# Patient Record
Sex: Female | Born: 1945
Health system: Southern US, Community
[De-identification: ages and names within clinical notes are randomized; demographics above are authoritative.]

## PROBLEM LIST (undated history)

## (undated) DIAGNOSIS — E7800 Pure hypercholesterolemia, unspecified: Secondary | ICD-10-CM

## (undated) DIAGNOSIS — E785 Hyperlipidemia, unspecified: Secondary | ICD-10-CM

## (undated) DIAGNOSIS — F329 Major depressive disorder, single episode, unspecified: Secondary | ICD-10-CM

## (undated) DIAGNOSIS — R7303 Prediabetes: Secondary | ICD-10-CM

## (undated) DIAGNOSIS — C189 Malignant neoplasm of colon, unspecified: Secondary | ICD-10-CM

## (undated) DIAGNOSIS — F418 Other specified anxiety disorders: Secondary | ICD-10-CM

## (undated) DIAGNOSIS — F419 Anxiety disorder, unspecified: Secondary | ICD-10-CM

## (undated) DIAGNOSIS — F32A Depression, unspecified: Secondary | ICD-10-CM

## (undated) DIAGNOSIS — Z85038 Personal history of other malignant neoplasm of large intestine: Secondary | ICD-10-CM

## (undated) DIAGNOSIS — M1990 Unspecified osteoarthritis, unspecified site: Secondary | ICD-10-CM

## (undated) DIAGNOSIS — R918 Other nonspecific abnormal finding of lung field: Secondary | ICD-10-CM

## (undated) DIAGNOSIS — R3915 Urgency of urination: Secondary | ICD-10-CM

## (undated) HISTORY — PX: COLON SURGERY: SHX602

## (undated) HISTORY — PX: APPENDECTOMY: SHX54

## (undated) HISTORY — DX: Other nonspecific abnormal finding of lung field: R91.8

## (undated) HISTORY — DX: Personal history of other malignant neoplasm of large intestine: Z85.038

## (undated) HISTORY — DX: Other specified anxiety disorders: F41.8

## (undated) HISTORY — PX: AUGMENTATION MAMMAPLASTY: SUR837

## (undated) HISTORY — PX: EYE SURGERY: SHX253

## (undated) HISTORY — DX: Hyperlipidemia, unspecified: E78.5

---

## 1976-06-05 HISTORY — PX: BREAST SURGERY: SHX581

## 1998-02-02 ENCOUNTER — Ambulatory Visit (HOSPITAL_COMMUNITY): Admission: RE | Admit: 1998-02-02 | Discharge: 1998-02-02 | Payer: Self-pay | Admitting: *Deleted

## 2004-12-01 ENCOUNTER — Ambulatory Visit (HOSPITAL_COMMUNITY): Admission: RE | Admit: 2004-12-01 | Discharge: 2004-12-01 | Payer: Self-pay | Admitting: Internal Medicine

## 2004-12-13 ENCOUNTER — Encounter: Admission: RE | Admit: 2004-12-13 | Discharge: 2004-12-13 | Payer: Self-pay | Admitting: Internal Medicine

## 2008-06-05 HISTORY — PX: PARTIAL COLECTOMY: SHX5273

## 2009-02-09 ENCOUNTER — Encounter: Payer: Self-pay | Admitting: Gastroenterology

## 2009-02-09 ENCOUNTER — Inpatient Hospital Stay (HOSPITAL_COMMUNITY): Admission: EM | Admit: 2009-02-09 | Discharge: 2009-02-22 | Payer: Self-pay | Admitting: Emergency Medicine

## 2009-02-09 ENCOUNTER — Ambulatory Visit: Payer: Self-pay | Admitting: Gastroenterology

## 2009-02-10 ENCOUNTER — Ambulatory Visit: Payer: Self-pay | Admitting: Oncology

## 2009-02-10 ENCOUNTER — Encounter: Payer: Self-pay | Admitting: Gastroenterology

## 2009-02-12 ENCOUNTER — Encounter: Payer: Self-pay | Admitting: Internal Medicine

## 2009-02-15 ENCOUNTER — Encounter (INDEPENDENT_AMBULATORY_CARE_PROVIDER_SITE_OTHER): Payer: Self-pay | Admitting: General Surgery

## 2009-02-15 ENCOUNTER — Ambulatory Visit: Payer: Self-pay | Admitting: Oncology

## 2009-03-09 ENCOUNTER — Encounter: Payer: Self-pay | Admitting: Gastroenterology

## 2009-03-09 LAB — CBC WITH DIFFERENTIAL/PLATELET
Eosinophils Absolute: 0.3 10*3/uL (ref 0.0–0.5)
MONO#: 0.4 10*3/uL (ref 0.1–0.9)
NEUT#: 7.4 10*3/uL — ABNORMAL HIGH (ref 1.5–6.5)
Platelets: 311 10*3/uL (ref 145–400)
RBC: 3.67 10*6/uL — ABNORMAL LOW (ref 3.70–5.45)
RDW: 17.8 % — ABNORMAL HIGH (ref 11.2–14.5)
WBC: 10.3 10*3/uL (ref 3.9–10.3)

## 2009-03-09 LAB — COMPREHENSIVE METABOLIC PANEL
AST: 25 U/L (ref 0–37)
Albumin: 3.8 g/dL (ref 3.5–5.2)
CO2: 26 mEq/L (ref 19–32)
Chloride: 103 mEq/L (ref 96–112)
Creatinine, Ser: 0.74 mg/dL (ref 0.40–1.20)
Glucose, Bld: 109 mg/dL — ABNORMAL HIGH (ref 70–99)
Potassium: 3.5 mEq/L (ref 3.5–5.3)
Sodium: 136 mEq/L (ref 135–145)
Total Protein: 6.7 g/dL (ref 6.0–8.3)

## 2009-03-09 LAB — LACTATE DEHYDROGENASE: LDH: 136 U/L (ref 94–250)

## 2009-03-31 ENCOUNTER — Ambulatory Visit: Payer: Self-pay | Admitting: Oncology

## 2009-04-20 ENCOUNTER — Ambulatory Visit: Payer: Self-pay | Admitting: Family Medicine

## 2009-04-26 ENCOUNTER — Ambulatory Visit: Payer: Self-pay | Admitting: Oncology

## 2009-04-26 LAB — COMPREHENSIVE METABOLIC PANEL
Albumin: 4.4 g/dL (ref 3.5–5.2)
Alkaline Phosphatase: 76 U/L (ref 39–117)
BUN: 17 mg/dL (ref 6–23)
Calcium: 9.4 mg/dL (ref 8.4–10.5)
Glucose, Bld: 124 mg/dL — ABNORMAL HIGH (ref 70–99)
Potassium: 4.2 mEq/L (ref 3.5–5.3)

## 2009-04-26 LAB — CBC WITH DIFFERENTIAL/PLATELET
EOS%: 7.8 % — ABNORMAL HIGH (ref 0.0–7.0)
Eosinophils Absolute: 0.4 10*3/uL (ref 0.0–0.5)
MCH: 31.4 pg (ref 25.1–34.0)
MCV: 92.6 fL (ref 79.5–101.0)
MONO%: 8.4 % (ref 0.0–14.0)
NEUT#: 2.3 10*3/uL (ref 1.5–6.5)
RBC: 3.7 10*6/uL (ref 3.70–5.45)
RDW: 21.1 % — ABNORMAL HIGH (ref 11.2–14.5)

## 2009-04-28 ENCOUNTER — Ambulatory Visit (HOSPITAL_COMMUNITY): Admission: RE | Admit: 2009-04-28 | Discharge: 2009-04-28 | Payer: Self-pay | Admitting: Internal Medicine

## 2009-05-21 ENCOUNTER — Ambulatory Visit: Payer: Self-pay | Admitting: Oncology

## 2009-05-25 LAB — CBC WITH DIFFERENTIAL/PLATELET
Basophils Absolute: 0 10*3/uL (ref 0.0–0.1)
EOS%: 7.9 % — ABNORMAL HIGH (ref 0.0–7.0)
Eosinophils Absolute: 0.6 10*3/uL — ABNORMAL HIGH (ref 0.0–0.5)
HGB: 13 g/dL (ref 11.6–15.9)
MCH: 33 pg (ref 25.1–34.0)
MONO#: 0.5 10*3/uL (ref 0.1–0.9)
NEUT#: 3.4 10*3/uL (ref 1.5–6.5)
RDW: 21.8 % — ABNORMAL HIGH (ref 11.2–14.5)
WBC: 7.1 10*3/uL (ref 3.9–10.3)
lymph#: 2.6 10*3/uL (ref 0.9–3.3)

## 2009-05-25 LAB — COMPREHENSIVE METABOLIC PANEL
AST: 60 U/L — ABNORMAL HIGH (ref 0–37)
Albumin: 4.3 g/dL (ref 3.5–5.2)
BUN: 19 mg/dL (ref 6–23)
CO2: 25 mEq/L (ref 19–32)
Calcium: 9.7 mg/dL (ref 8.4–10.5)
Chloride: 105 mEq/L (ref 96–112)
Potassium: 4.5 mEq/L (ref 3.5–5.3)

## 2009-06-22 ENCOUNTER — Ambulatory Visit: Payer: Self-pay | Admitting: Family Medicine

## 2009-06-24 ENCOUNTER — Ambulatory Visit: Payer: Self-pay | Admitting: Oncology

## 2009-06-28 LAB — COMPREHENSIVE METABOLIC PANEL
ALT: 66 U/L — ABNORMAL HIGH (ref 0–35)
Albumin: 4.5 g/dL (ref 3.5–5.2)
CO2: 23 mEq/L (ref 19–32)
Calcium: 9.4 mg/dL (ref 8.4–10.5)
Sodium: 140 mEq/L (ref 135–145)
Total Protein: 7 g/dL (ref 6.0–8.3)

## 2009-06-28 LAB — CBC WITH DIFFERENTIAL/PLATELET
HGB: 12.8 g/dL (ref 11.6–15.9)
MCH: 34.7 pg — ABNORMAL HIGH (ref 25.1–34.0)
MCHC: 34.6 g/dL (ref 31.5–36.0)
MONO%: 6.9 % (ref 0.0–14.0)
NEUT#: 4 10*3/uL (ref 1.5–6.5)
Platelets: 260 10*3/uL (ref 145–400)
RBC: 3.71 10*6/uL (ref 3.70–5.45)

## 2009-06-28 LAB — LACTATE DEHYDROGENASE: LDH: 185 U/L (ref 94–250)

## 2009-07-12 LAB — CBC WITH DIFFERENTIAL/PLATELET
EOS%: 7.5 % — ABNORMAL HIGH (ref 0.0–7.0)
HCT: 37.8 % (ref 34.8–46.6)
LYMPH%: 37.3 % (ref 14.0–49.7)
MCH: 35 pg — ABNORMAL HIGH (ref 25.1–34.0)
Platelets: 242 10*3/uL (ref 145–400)
RDW: 19.5 % — ABNORMAL HIGH (ref 11.2–14.5)
WBC: 7.1 10*3/uL (ref 3.9–10.3)

## 2009-07-26 ENCOUNTER — Ambulatory Visit: Payer: Self-pay | Admitting: Oncology

## 2009-07-26 LAB — CBC WITH DIFFERENTIAL/PLATELET
BASO%: 0.7 % (ref 0.0–2.0)
Eosinophils Absolute: 0.5 10*3/uL (ref 0.0–0.5)
HCT: 33.7 % — ABNORMAL LOW (ref 34.8–46.6)
MCHC: 35.1 g/dL (ref 31.5–36.0)
MONO#: 0.4 10*3/uL (ref 0.1–0.9)
NEUT#: 2.8 10*3/uL (ref 1.5–6.5)
NEUT%: 45.2 % (ref 38.4–76.8)
WBC: 6.3 10*3/uL (ref 3.9–10.3)
lymph#: 2.4 10*3/uL (ref 0.9–3.3)

## 2009-08-09 LAB — CBC WITH DIFFERENTIAL/PLATELET
BASO%: 0.9 % (ref 0.0–2.0)
Eosinophils Absolute: 0.4 10*3/uL (ref 0.0–0.5)
HGB: 11.9 g/dL (ref 11.6–15.9)
LYMPH%: 38.8 % (ref 14.0–49.7)
MCH: 36.3 pg — ABNORMAL HIGH (ref 25.1–34.0)
MCHC: 34.4 g/dL (ref 31.5–36.0)
MCV: 105.7 fL — ABNORMAL HIGH (ref 79.5–101.0)
MONO#: 0.4 10*3/uL (ref 0.1–0.9)
NEUT%: 45.6 % (ref 38.4–76.8)
Platelets: 226 10*3/uL (ref 145–400)
WBC: 5.7 10*3/uL (ref 3.9–10.3)
lymph#: 2.2 10*3/uL (ref 0.9–3.3)

## 2009-08-09 LAB — IRON AND TIBC
%SAT: 22 % (ref 20–55)
UIBC: 307 ug/dL

## 2009-08-09 LAB — COMPREHENSIVE METABOLIC PANEL
AST: 22 U/L (ref 0–37)
Albumin: 4.1 g/dL (ref 3.5–5.2)
Alkaline Phosphatase: 102 U/L (ref 39–117)
BUN: 15 mg/dL (ref 6–23)
Total Bilirubin: 0.4 mg/dL (ref 0.3–1.2)

## 2009-08-09 LAB — FERRITIN: Ferritin: 117 ng/mL (ref 10–291)

## 2009-08-23 ENCOUNTER — Ambulatory Visit: Payer: Self-pay | Admitting: Oncology

## 2009-08-23 LAB — CBC WITH DIFFERENTIAL/PLATELET
BASO%: 0.7 % (ref 0.0–2.0)
Basophils Absolute: 0.1 10*3/uL (ref 0.0–0.1)
Eosinophils Absolute: 0.5 10*3/uL (ref 0.0–0.5)
HGB: 13.2 g/dL (ref 11.6–15.9)
LYMPH%: 24.6 % (ref 14.0–49.7)
MONO%: 5.6 % (ref 0.0–14.0)
NEUT%: 63.5 % (ref 38.4–76.8)
RDW: 17.7 % — ABNORMAL HIGH (ref 11.2–14.5)
WBC: 9.4 10*3/uL (ref 3.9–10.3)
lymph#: 2.3 10*3/uL (ref 0.9–3.3)

## 2009-09-02 ENCOUNTER — Ambulatory Visit: Payer: Self-pay | Admitting: Family Medicine

## 2009-09-06 LAB — CBC WITH DIFFERENTIAL/PLATELET
Basophils Absolute: 0 10*3/uL (ref 0.0–0.1)
EOS%: 5.6 % (ref 0.0–7.0)
Eosinophils Absolute: 0.4 10*3/uL (ref 0.0–0.5)
HCT: 34.5 % — ABNORMAL LOW (ref 34.8–46.6)
HGB: 11.9 g/dL (ref 11.6–15.9)
LYMPH%: 40.8 % (ref 14.0–49.7)
MONO#: 0.6 10*3/uL (ref 0.1–0.9)
NEUT#: 2.9 10*3/uL (ref 1.5–6.5)
WBC: 6.5 10*3/uL (ref 3.9–10.3)

## 2009-09-20 LAB — COMPREHENSIVE METABOLIC PANEL
ALT: 12 U/L (ref 0–35)
Albumin: 4.3 g/dL (ref 3.5–5.2)
Chloride: 106 mEq/L (ref 96–112)
Potassium: 4 mEq/L (ref 3.5–5.3)
Sodium: 139 mEq/L (ref 135–145)

## 2009-09-20 LAB — CBC WITH DIFFERENTIAL/PLATELET
HCT: 36.8 % (ref 34.8–46.6)
HGB: 12.6 g/dL (ref 11.6–15.9)
LYMPH%: 43.8 % (ref 14.0–49.7)
MCH: 36.5 pg — ABNORMAL HIGH (ref 25.1–34.0)
MCHC: 34.1 g/dL (ref 31.5–36.0)
MONO%: 6.8 % (ref 0.0–14.0)
WBC: 6 10*3/uL (ref 3.9–10.3)
lymph#: 2.6 10*3/uL (ref 0.9–3.3)

## 2009-10-13 ENCOUNTER — Ambulatory Visit (HOSPITAL_COMMUNITY): Admission: RE | Admit: 2009-10-13 | Discharge: 2009-10-13 | Payer: Self-pay | Admitting: Oncology

## 2009-12-13 ENCOUNTER — Ambulatory Visit: Payer: Self-pay | Admitting: Oncology

## 2009-12-15 LAB — CBC WITH DIFFERENTIAL/PLATELET
Basophils Absolute: 0 10*3/uL (ref 0.0–0.1)
Eosinophils Absolute: 0.3 10*3/uL (ref 0.0–0.5)
HCT: 37.2 % (ref 34.8–46.6)
HGB: 13.2 g/dL (ref 11.6–15.9)
LYMPH%: 34.4 % (ref 14.0–49.7)
MCH: 33.7 pg (ref 25.1–34.0)
MONO%: 6.4 % (ref 0.0–14.0)
NEUT%: 54.2 % (ref 38.4–76.8)
Platelets: 276 10*3/uL (ref 145–400)
RBC: 3.92 10*6/uL (ref 3.70–5.45)
RDW: 13.4 % (ref 11.2–14.5)
lymph#: 2.3 10*3/uL (ref 0.9–3.3)

## 2009-12-15 LAB — COMPREHENSIVE METABOLIC PANEL
Albumin: 4.5 g/dL (ref 3.5–5.2)
Alkaline Phosphatase: 87 U/L (ref 39–117)
Chloride: 107 mEq/L (ref 96–112)

## 2009-12-15 LAB — LIPID PANEL
LDL Cholesterol: 194 mg/dL — ABNORMAL HIGH (ref 0–99)
Total CHOL/HDL Ratio: 6.1 Ratio
Triglycerides: 224 mg/dL — ABNORMAL HIGH (ref <150)

## 2009-12-15 LAB — LACTATE DEHYDROGENASE: LDH: 159 U/L (ref 94–250)

## 2010-01-19 ENCOUNTER — Encounter: Payer: Self-pay | Admitting: Gastroenterology

## 2010-01-25 ENCOUNTER — Encounter (INDEPENDENT_AMBULATORY_CARE_PROVIDER_SITE_OTHER): Payer: Self-pay | Admitting: *Deleted

## 2010-01-28 ENCOUNTER — Ambulatory Visit: Payer: Self-pay | Admitting: Gastroenterology

## 2010-02-11 ENCOUNTER — Ambulatory Visit: Payer: Self-pay | Admitting: Gastroenterology

## 2010-02-15 ENCOUNTER — Encounter: Payer: Self-pay | Admitting: Gastroenterology

## 2010-03-10 ENCOUNTER — Ambulatory Visit: Payer: Self-pay | Admitting: Oncology

## 2010-03-14 ENCOUNTER — Ambulatory Visit (HOSPITAL_COMMUNITY): Admission: RE | Admit: 2010-03-14 | Discharge: 2010-03-14 | Payer: Self-pay | Admitting: Oncology

## 2010-03-14 LAB — CBC WITH DIFFERENTIAL/PLATELET
Basophils Absolute: 0 10*3/uL (ref 0.0–0.1)
HGB: 12.6 g/dL (ref 11.6–15.9)
LYMPH%: 36.7 % (ref 14.0–49.7)
MONO#: 0.3 10*3/uL (ref 0.1–0.9)
NEUT#: 3.1 10*3/uL (ref 1.5–6.5)
NEUT%: 52.2 % (ref 38.4–76.8)
Platelets: 266 10*3/uL (ref 145–400)
RDW: 13.1 % (ref 11.2–14.5)
lymph#: 2.2 10*3/uL (ref 0.9–3.3)

## 2010-03-14 LAB — COMPREHENSIVE METABOLIC PANEL
Calcium: 9.2 mg/dL (ref 8.4–10.5)
Chloride: 107 mEq/L (ref 96–112)
Potassium: 3.7 mEq/L (ref 3.5–5.3)
Sodium: 141 mEq/L (ref 135–145)

## 2010-03-14 LAB — LACTATE DEHYDROGENASE: LDH: 160 U/L (ref 94–250)

## 2010-03-14 LAB — LIPID PANEL: LDL Cholesterol: 201 mg/dL — ABNORMAL HIGH (ref 0–99)

## 2010-06-24 ENCOUNTER — Other Ambulatory Visit: Payer: Self-pay | Admitting: Oncology

## 2010-06-24 DIAGNOSIS — C189 Malignant neoplasm of colon, unspecified: Secondary | ICD-10-CM

## 2010-07-05 NOTE — Letter (Signed)
Summary: Pre Visit Letter Revised  [facility]  [address] [place], [place] [address]   Phone: [phone]  Fax: [phone]    01/19/2010 MRN: [id number]  Kelli [name] [address] [place], [place]  [address]              Procedure Date:  Sept. 9, 2011   Welcome to the Gastroenterology Division at [facility].    You are scheduled to see a nurse for your pre-procedure visit on Aug.26, 2011 at 10:30am on the 3rd floor at [facility], [address]  We ask that you try to arrive at our office 15 minutes prior to your appointment time to allow for check-in.  Please take a minute to review the attached form.  If you answer "Yes" to one or more of the questions on the first page, we ask that you call the person listed at your earliest opportunity.  If you answer "No" to all of the questions, please complete the rest of the form and bring it to your appointment.    Your nurse visit will consist of discussing your medical and surgical history, your immediate family medical history, and your medications.    If you are unable to list all of your medications on the form, please bring the medication bottles to your appointment and we will list them.  We will need to be aware of both prescribed and over the counter drugs.  We will need to know exact dosage information as well.    Please be prepared to read and sign documents such as consent forms, a financial agreement, and acknowledgement forms.  If necessary, and with your consent, a friend or relative is welcome to sit-in on the nurse visit with you.  Please bring your insurance card so that we may make a copy of it.  If your insurance requires a referral to see a specialist, please bring your referral form from your primary care physician.  No co-pay is required for this nurse visit.     If you cannot keep your appointment, please call [phone] to cancel or reschedule prior to your appointment date.  This allows  [place] the opportunity to schedule an appointment for another patient in need of care.   Thank you for choosing [facility] for your medical needs.  We appreciate the opportunity to care for you.  Please visit [place] at our website  to learn more about our practice.                     Sincerely,                                                                                The Gastroenterology Division

## 2010-07-05 NOTE — Procedures (Signed)
Summary: Colonoscopy  Patient: [name] Note: All result statuses are Final unless otherwise noted.  Tests: (1) Colonoscopy (COL)   COL Colonoscopy           DONE     [facility]     [address]     [place], [place]  [address]           COLONOSCOPY PROCEDURE REPORT           PATIENT:  Kelli [name], Kelli [name]  MR#:  [id number]     BIRTHDATE:  [birth year], 64 yrs. old  GENDER:  female     ENDOSCOPIST:  Rachael Fee, MD     REF. BY:  Cephas Darby, M.D.     PROCEDURE DATE:  02/11/2010     PROCEDURE:  Colonoscopy with snare polypectomy     ASA CLASS:  Class II     INDICATIONS:  T3N0M0 cecal cancer resected 1 year ago; +adjuvant     chemo with Dr. Cyndie Chime     MEDICATIONS:   Fentanyl 75 mcg IV, Versed 9 mg IV           DESCRIPTION OF PROCEDURE:   After the risks benefits and     alternatives of the procedure were thoroughly explained, informed     consent was obtained.  Digital rectal exam was performed and     revealed no rectal masses.   The [id number] endoscope was     introduced through the anus and advanced to the anastomosis,     without limitations.  The quality of the prep was good, using     MoviPrep.  The instrument was then slowly withdrawn as the colon     was fully examined.     <<PROCEDUREIMAGES>>     FINDINGS:   The ileocecal anastomosis was normal appearing. No     recurrent, residual neoplastic mucosa (see image1, image2, and     image3). There were 7-8 small (1-71mm), sessile polyps throughout     colon. Most of these appeared hyperplastic. All were removed with     cold snare and sent to pathology (jar 1) (see image4 and image6).     This was otherwise a normal examination of the colon (see image8).     Retroflexed views in the rectum revealed no abnormalities.    The     scope was then withdrawn from the patient and the procedure     completed.     COMPLICATIONS:  None           ENDOSCOPIC IMPRESSION:     1) Normal appearing ileocecal  anastomosis     2) Multiple small (hyperplastic appearing) polyps removed and     sent to pathology     3) Otherwise normal examination           RECOMMENDATIONS:     1) Given your personal history of colon cancer, you will need a     repeat colonoscopy in 3 years.     2) You will receive a letter within 1-2 weeks with the results     of your biopsy as well as final recommendations. Please call my     office if you have not received a letter after 3 weeks.           ______________________________     Rachael Fee, MD           cc: Avel Peace, MD  n.     eSIGNED:   Rachael Fee at 02/11/2010 11:25 AM           Marinda Elk, [phone]  Note: An exclamation mark (!) indicates a result that was not dispersed into the flowsheet. Document Creation Date: 02/11/2010 11:26 AM _______________________________________________________________________  (1) Order result status: Final Collection or observation date-time: 02/11/2010 11:15 Requested date-time:  Receipt date-time:  Reported date-time:  Referring Physician:   Ordering Physician: Rob Bunting [phone]) Specimen Source:  Source: Launa Grill Order Number: [id number] Lab site:   Appended Document: Colonoscopy     Procedures Next Due Date:    Colonoscopy: 02/2013

## 2010-07-05 NOTE — Letter (Signed)
Summary: Results Letter  [facility]  [address] [place], [place] [address]   Phone: [phone]  Fax: [phone]        February 15, 2010 MRN: [id number]    Kelli [name] [address] [place], [place]  [address]    Dear Ms. Splawn,   Only one of the polyps removed during your recent procedure were proven to be adenomatous.  These are pre-cancerous polyps that may have grown into cancers if they had not been removed.  Based on current nationally recognized polyp (and cancer) surveillance guidelines, I recommend that you have a repeat colonoscopy in 3 years.   We will therefore put your information in our reminder system and will contact you in 3 years to schedule a repeat procedure.  Please call if you have any questions or concerns.       Sincerely,  Rachael Fee MD  This letter has been electronically signed by your physician.  Appended Document: Results Letter letter mailed.

## 2010-07-05 NOTE — Letter (Signed)
Summary: [facility] Instructions  [facility]  [address] [place], [place] [address]   Phone: [phone]  Fax: [phone]       TENNESSEE HANLON    [birth year]    MRN: [id number]        Procedure Day Dorna Bloom:  Farrell Ours  02/11/10     Arrival Time:  9:30AM     Procedure Time:  10:30AM     Location of Procedure:                    _ X_  [facility] (4th Floor)                       PREPARATION FOR COLONOSCOPY WITH MOVIPREP   Starting 5 days prior to your procedure 02/06/10 do not eat nuts, seeds, popcorn, corn, beans, peas,  salads, or any raw vegetables.  Do not take any fiber supplements (e.g. Metamucil, Citrucel, and Benefiber).  THE DAY BEFORE YOUR PROCEDURE         DATE: 02/10/10  DAY: THURSDAY  1.  Drink clear liquids the entire day-NO SOLID FOOD  2.  Do not drink anything colored red or purple.  Avoid juices with pulp.  No orange juice.  3.  Drink at least 64 oz. (8 glasses) of fluid/clear liquids during the day to prevent dehydration and help the prep work efficiently.  CLEAR LIQUIDS INCLUDE: Water Jello Ice Popsicles Tea (sugar ok, no milk/cream) Powdered fruit flavored drinks Coffee (sugar ok, no milk/cream) Gatorade Juice: apple, white grape, white cranberry  Lemonade Clear bullion, consomm, broth Carbonated beverages (any kind) Strained chicken noodle soup Hard Candy                             4.  In the morning, mix first dose of MoviPrep solution:    Empty 1 Pouch A and 1 Pouch B into the disposable container    Add lukewarm drinking water to the top line of the container. Mix to dissolve    Refrigerate (mixed solution should be used within 24 hrs)  5.  Begin drinking the prep at 5:00 p.m. The MoviPrep container is divided by 4 marks.   Every 15 minutes drink the solution down to the next mark (approximately 8 oz) until the full liter is complete.   6.  Follow completed prep with 16 oz of clear liquid of your choice (Nothing red  or purple).  Continue to drink clear liquids until bedtime.  7.  Before going to bed, mix second dose of MoviPrep solution:    Empty 1 Pouch A and 1 Pouch B into the disposable container    Add lukewarm drinking water to the top line of the container. Mix to dissolve    Refrigerate  THE DAY OF YOUR PROCEDURE      DATE: 02/11/10  DAY: FRIDAY  Beginning at 5:30AM (5 hours before procedure):         1. Every 15 minutes, drink the solution down to the next mark (approx 8 oz) until the full liter is complete.  2. Follow completed prep with 16 oz. of clear liquid of your choice.    3. You may drink clear liquids until 8:30AM (2 HOURS BEFORE PROCEDURE).   MEDICATION INSTRUCTIONS  Unless otherwise instructed, you should take regular prescription medications with a small sip of water   as early as possible the morning of  your procedure.   Additional medication instructions: n/a         OTHER INSTRUCTIONS  You will need a responsible adult at least 63 years of age to accompany you and drive you home.   This person must remain in the waiting room during your procedure.  Wear loose fitting clothing that is easily removed.  Leave jewelry and other valuables at home.  However, you may wish to bring a book to read or  an iPod/MP3 player to listen to music as you wait for your procedure to start.  Remove all body piercing jewelry and leave at home.  Total time from sign-in until discharge is approximately 2-3 hours.  You should go home directly after your procedure and rest.  You can resume normal activities the  day after your procedure.  The day of your procedure you should not:   Drive   Make legal decisions   Operate machinery   Drink alcohol   Return to work  You will receive specific instructions about eating, activities and medications before you leave.    The above instructions have been reviewed and explained to me by   Sherren Kerns RN  January 28, 2010 10:54  AM     I fully understand and can verbalize these instructions _____________________________ Date _________

## 2010-07-05 NOTE — Miscellaneous (Signed)
Summary: Previsit prep/rm  Clinical Lists Changes  Medications: Added new medication of MOVIPREP 100 GM  SOLR (PEG-KCL-NACL-NASULF-NA ASC-C) As per prep instructions. - Signed Rx of MOVIPREP 100 GM  SOLR (PEG-KCL-NACL-NASULF-NA ASC-C) As per prep instructions.;  #1 x 0;  Signed;  Entered by: Sherren Kerns RN;  Authorized by: Rachael Fee MD;  Method used: Electronically to [facility]. [phone]*, [address], [place], [place]  [address], Ph: [phone], Fax: [phone] Observations: Added new observation of ALLERGY REV: Done (01/28/2010 10:22) Added new observation of NKA: T (01/28/2010 10:22)    Prescriptions: MOVIPREP 100 GM  SOLR (PEG-KCL-NACL-NASULF-NA ASC-C) As per prep instructions.  #1 x 0   Entered by:   Sherren Kerns RN   Authorized by:   Rachael Fee MD   Signed by:   Sherren Kerns RN on 01/28/2010   Method used:   Electronically to        [facility] [address] [phone]* (retail)       [address] [place], [place]  [address]       Ph: [phone]       Fax: [phone]   RxID:   [id number]

## 2010-09-09 LAB — URINALYSIS, ROUTINE W REFLEX MICROSCOPIC
Glucose, UA: NEGATIVE mg/dL
Hgb urine dipstick: NEGATIVE
Specific Gravity, Urine: 1.027 (ref 1.005–1.030)
pH: 5.5 (ref 5.0–8.0)

## 2010-09-09 LAB — COMPREHENSIVE METABOLIC PANEL
AST: 14 U/L (ref 0–37)
BUN: 9 mg/dL (ref 6–23)
CO2: 26 mEq/L (ref 19–32)
Calcium: 8.9 mg/dL (ref 8.4–10.5)
Chloride: 104 mEq/L (ref 96–112)
Creatinine, Ser: 0.74 mg/dL (ref 0.4–1.2)
GFR calc Af Amer: >60 mL/min (ref >60)
GFR calc non Af Amer: >60 mL/min (ref >60)
Glucose, Bld: 101 mg/dL — ABNORMAL HIGH (ref 70–99)
Total Bilirubin: 0.3 mg/dL (ref 0.3–1.2)

## 2010-09-09 LAB — CROSSMATCH
ABO/RH(D): O POS
Antibody Screen: NEGATIVE

## 2010-09-09 LAB — POCT I-STAT, CHEM 8
BUN: 13 mg/dL (ref 6–23)
Creatinine, Ser: 0.9 mg/dL (ref 0.4–1.2)
Glucose, Bld: 112 mg/dL — ABNORMAL HIGH (ref 70–99)
Hemoglobin: 11.9 g/dL — ABNORMAL LOW (ref 12.0–15.0)
Potassium: 4.3 mEq/L (ref 3.5–5.1)
Sodium: 137 mEq/L (ref 135–145)

## 2010-09-09 LAB — HEMOCCULT GUIAC POC 1CARD (OFFICE): Fecal Occult Bld: NEGATIVE

## 2010-09-09 LAB — CBC
HCT: 32.6 % — ABNORMAL LOW (ref 36.0–46.0)
HCT: 27.6 % — ABNORMAL LOW (ref 36.0–46.0)
HCT: 30.7 % — ABNORMAL LOW (ref 36.0–46.0)
Hemoglobin: 10.9 g/dL — ABNORMAL LOW (ref 12.0–15.0)
Hemoglobin: 10.4 g/dL — ABNORMAL LOW (ref 12.0–15.0)
Hemoglobin: 11.4 g/dL — ABNORMAL LOW (ref 12.0–15.0)
Hemoglobin: 9.4 g/dL — ABNORMAL LOW (ref 12.0–15.0)
MCHC: 33.5 g/dL (ref 30.0–36.0)
MCHC: 33.8 g/dL (ref 30.0–36.0)
MCHC: 33.9 g/dL (ref 30.0–36.0)
MCHC: 34 g/dL (ref 30.0–36.0)
MCV: 85.4 fL (ref 78.0–100.0)
MCV: 85.5 fL (ref 78.0–100.0)
MCV: 85.5 fL (ref 78.0–100.0)
MCV: 85.7 fL (ref 78.0–100.0)
MCV: 86.4 fL (ref 78.0–100.0)
Platelets: 371 10*3/uL (ref 150–400)
Platelets: 372 10*3/uL (ref 150–400)
RBC: 3.2 MIL/uL — ABNORMAL LOW (ref 3.87–5.11)
RBC: 3.33 MIL/uL — ABNORMAL LOW (ref 3.87–5.11)
RBC: 3.59 MIL/uL — ABNORMAL LOW (ref 3.87–5.11)
RBC: 3.95 MIL/uL (ref 3.87–5.11)
RDW: 15 % (ref 11.5–15.5)
RDW: 14.6 % (ref 11.5–15.5)
WBC: 8.7 10*3/uL (ref 4.0–10.5)
WBC: 10.3 10*3/uL (ref 4.0–10.5)
WBC: 11.6 10*3/uL — ABNORMAL HIGH (ref 4.0–10.5)
WBC: 13.7 10*3/uL — ABNORMAL HIGH (ref 4.0–10.5)

## 2010-09-09 LAB — BASIC METABOLIC PANEL
BUN: <1 mg/dL — ABNORMAL LOW (ref 6–23)
CO2: 28 mEq/L (ref 19–32)
Calcium: 8.1 mg/dL — ABNORMAL LOW (ref 8.4–10.5)
Calcium: 8.5 mg/dL (ref 8.4–10.5)
Chloride: 102 mEq/L (ref 96–112)
Chloride: 106 mEq/L (ref 96–112)
Creatinine, Ser: 0.69 mg/dL (ref 0.4–1.2)
GFR calc Af Amer: >60 mL/min (ref >60)
GFR calc Af Amer: >60 mL/min (ref >60)
GFR calc non Af Amer: >60 mL/min (ref >60)
Glucose, Bld: 120 mg/dL — ABNORMAL HIGH (ref 70–99)
Potassium: 3.7 mEq/L (ref 3.5–5.1)
Potassium: 4.5 mEq/L (ref 3.5–5.1)
Sodium: 139 mEq/L (ref 135–145)
Sodium: 141 mEq/L (ref 135–145)

## 2010-09-09 LAB — URINE MICROSCOPIC-ADD ON

## 2010-09-09 LAB — DIFFERENTIAL
Basophils Absolute: 0 10*3/uL (ref 0.0–0.1)
Basophils Relative: 0 % (ref 0–1)
Basophils Relative: 0 % (ref 0–1)
Eosinophils Absolute: 0.4 10*3/uL (ref 0.0–0.7)
Eosinophils Relative: 4 % (ref 0–5)
Lymphs Abs: 2.5 10*3/uL (ref 0.7–4.0)
Monocytes Absolute: 0.7 10*3/uL (ref 0.1–1.0)
Monocytes Absolute: 0.7 10*3/uL (ref 0.1–1.0)
Monocytes Relative: 5 % (ref 3–12)
Monocytes Relative: 6 % (ref 3–12)
Neutro Abs: 10 10*3/uL — ABNORMAL HIGH (ref 1.7–7.7)
Neutrophils Relative %: 73 % (ref 43–77)

## 2010-09-12 ENCOUNTER — Ambulatory Visit (HOSPITAL_COMMUNITY)
Admission: RE | Admit: 2010-09-12 | Discharge: 2010-09-12 | Disposition: A | Discharge status: Home / Self Care | Payer: Self-pay | Source: Ambulatory Visit | Attending: Oncology | Admitting: Oncology

## 2010-09-12 ENCOUNTER — Encounter (HOSPITAL_BASED_OUTPATIENT_CLINIC_OR_DEPARTMENT_OTHER): Payer: Self-pay | Admitting: Oncology

## 2010-09-12 ENCOUNTER — Other Ambulatory Visit: Payer: Self-pay | Admitting: Oncology

## 2010-09-12 ENCOUNTER — Other Ambulatory Visit (HOSPITAL_COMMUNITY): Payer: Self-pay

## 2010-09-12 ENCOUNTER — Encounter (HOSPITAL_COMMUNITY): Payer: Self-pay

## 2010-09-12 DIAGNOSIS — C180 Malignant neoplasm of cecum: Secondary | ICD-10-CM

## 2010-09-12 DIAGNOSIS — R911 Solitary pulmonary nodule: Secondary | ICD-10-CM | POA: Insufficient documentation

## 2010-09-12 DIAGNOSIS — Z85038 Personal history of other malignant neoplasm of large intestine: Secondary | ICD-10-CM | POA: Insufficient documentation

## 2010-09-12 DIAGNOSIS — C189 Malignant neoplasm of colon, unspecified: Secondary | ICD-10-CM

## 2010-09-12 DIAGNOSIS — E785 Hyperlipidemia, unspecified: Secondary | ICD-10-CM

## 2010-09-12 DIAGNOSIS — Z09 Encounter for follow-up examination after completed treatment for conditions other than malignant neoplasm: Secondary | ICD-10-CM | POA: Insufficient documentation

## 2010-09-12 DIAGNOSIS — J984 Other disorders of lung: Secondary | ICD-10-CM

## 2010-09-12 HISTORY — DX: Malignant neoplasm of colon, unspecified: C18.9

## 2010-09-12 LAB — CBC WITH DIFFERENTIAL/PLATELET
BASO%: 0.6 % (ref 0.0–2.0)
HCT: 35.8 % (ref 34.8–46.6)
LYMPH%: 33.1 % (ref 14.0–49.7)
MCHC: 34.1 g/dL (ref 31.5–36.0)
MONO#: 0.4 10*3/uL (ref 0.1–0.9)
NEUT%: 54.2 % (ref 38.4–76.8)
Platelets: 244 10*3/uL (ref 145–400)
WBC: 6.5 10*3/uL (ref 3.9–10.3)

## 2010-09-12 LAB — CMP (CANCER CENTER ONLY)
CO2: 25 mEq/L (ref 18–33)
Creat: 0.8 mg/dl (ref 0.6–1.2)
Glucose, Bld: 135 mg/dL — ABNORMAL HIGH (ref 73–118)
Total Bilirubin: 0.6 mg/dl (ref 0.20–1.60)

## 2010-09-12 LAB — CEA: CEA: 1.8 ng/mL (ref 0.0–5.0)

## 2010-09-12 LAB — LACTATE DEHYDROGENASE: LDH: 167 U/L (ref 94–250)

## 2010-09-12 MED ORDER — IOHEXOL 300 MG/ML  SOLN
100.0000 mL | Freq: Once | INTRAMUSCULAR | Status: AC | PRN
  Administered 2010-09-12: 100 mL via INTRAVENOUS

## 2010-09-19 ENCOUNTER — Other Ambulatory Visit: Payer: Self-pay | Admitting: Oncology

## 2010-09-19 ENCOUNTER — Encounter (HOSPITAL_BASED_OUTPATIENT_CLINIC_OR_DEPARTMENT_OTHER): Payer: Self-pay | Admitting: Oncology

## 2010-09-19 DIAGNOSIS — C180 Malignant neoplasm of cecum: Secondary | ICD-10-CM

## 2010-09-19 DIAGNOSIS — E785 Hyperlipidemia, unspecified: Secondary | ICD-10-CM

## 2010-09-19 DIAGNOSIS — C189 Malignant neoplasm of colon, unspecified: Secondary | ICD-10-CM

## 2010-09-19 DIAGNOSIS — J984 Other disorders of lung: Secondary | ICD-10-CM

## 2010-11-14 IMAGING — CT CT CHEST W/ CM
2 of 3 series · 15 of 36 positions shown, 18 images · IV contrast (100 ML OMNI 300)
Comparison: CT abdomen pelvis 02/09/2009

CLINICAL DATA: Colon cancer staging, epigastric pain, smoker.
Leukocytosis with abnormal densities at the lung bases on
02/09/2009.

CT CHEST WITH CONTRAST
TECHNIQUE: Multidetector CT imaging of the chest was performed
following the standard protocol during bolus administration of
intravenous contrast.
Contrast: 70 ml Tmnipaque-500

[Series 2: routine chest · axial · 0.80mm/px · z∈[-266,-6]mm · 12 of 62 slices shown, 15 images]
[im 5/62  mediastinal]
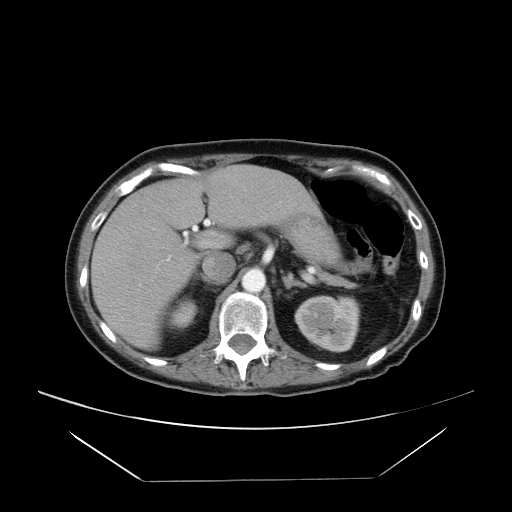
[im 5/62  lung]
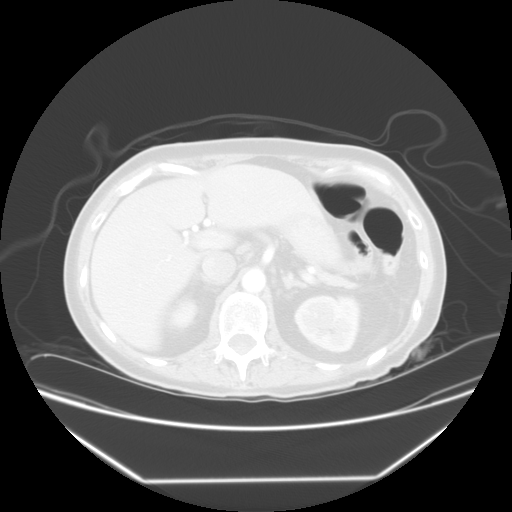
[im 10/62  lung]
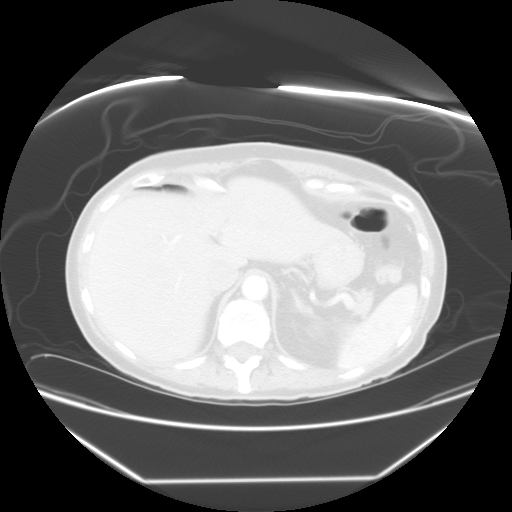
[im 14/62  lung]
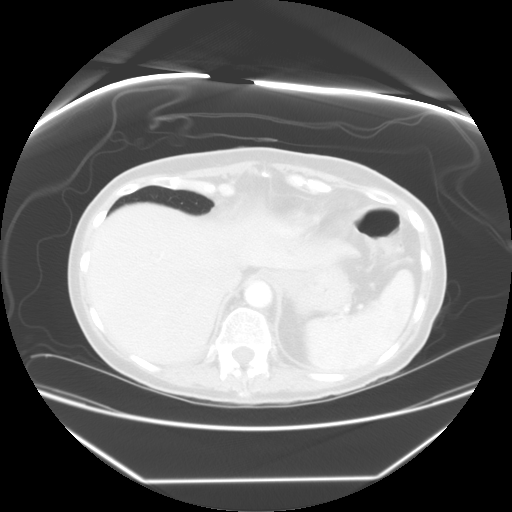
[im 19/62  lung]
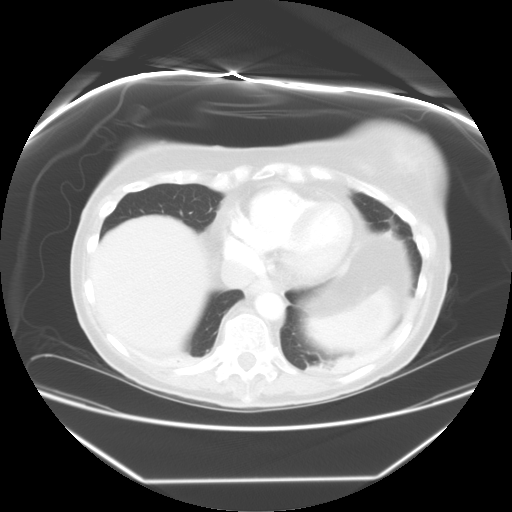
[im 23/62  mediastinal]
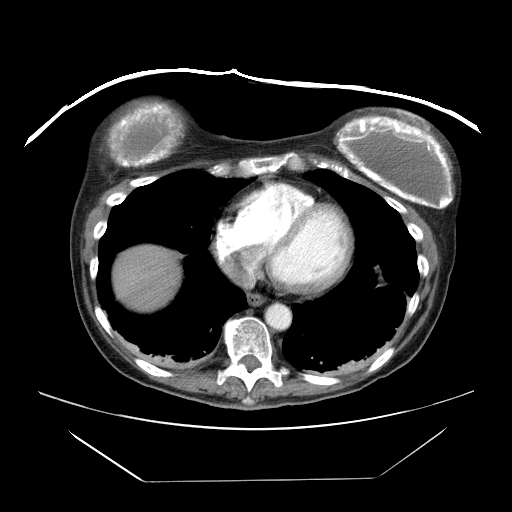
[im 23/62  lung]
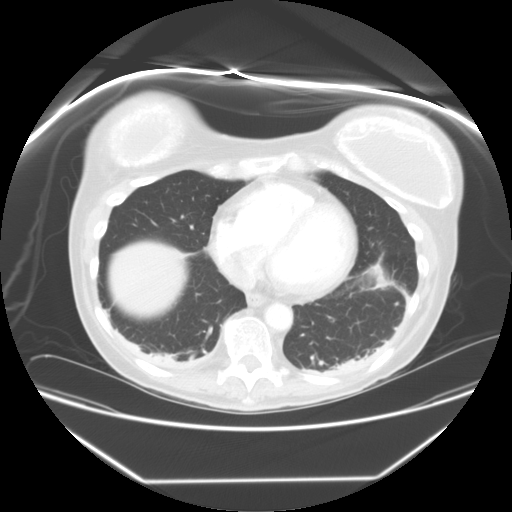
[im 28/62  lung]
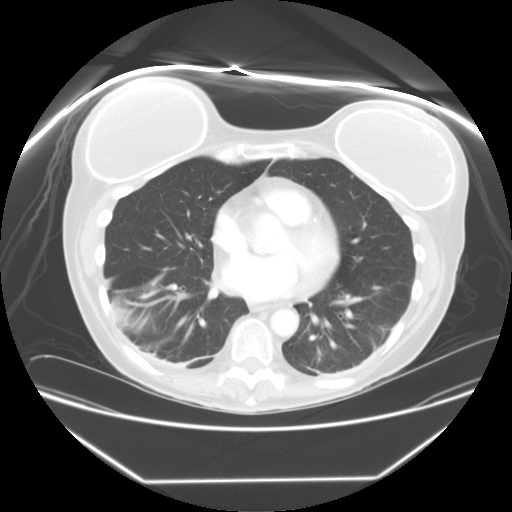
[im 34/62  lung]
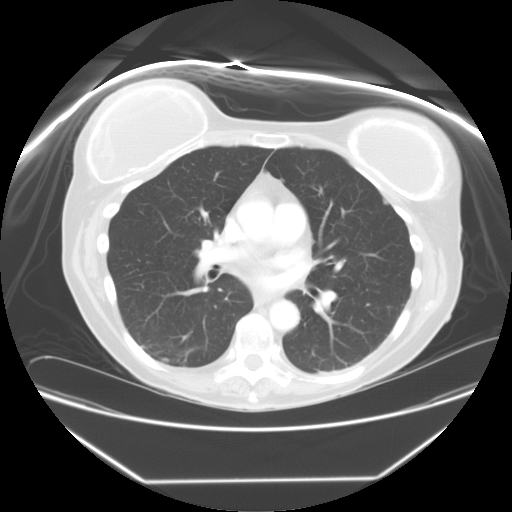
[im 39/62  lung]
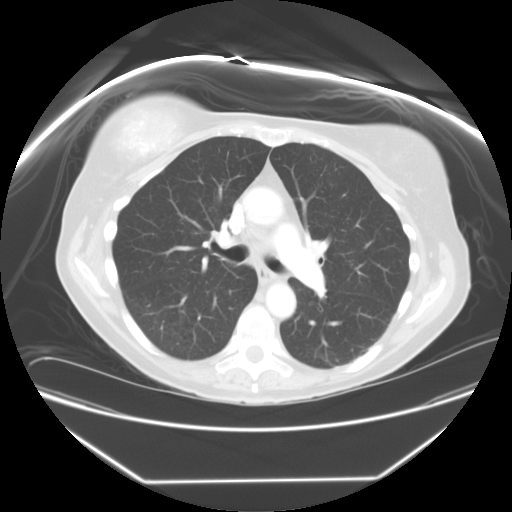
[im 43/62  mediastinal]
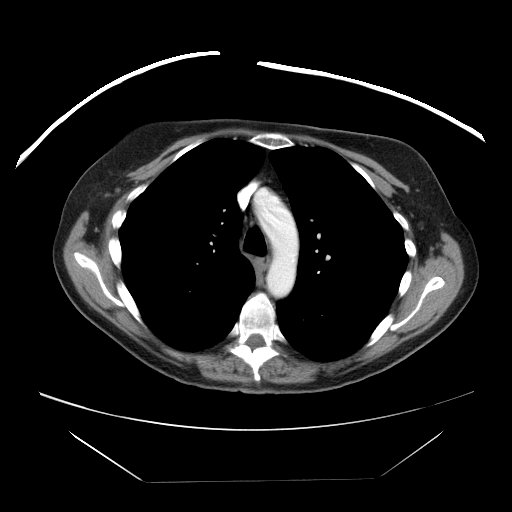
[im 43/62  lung]
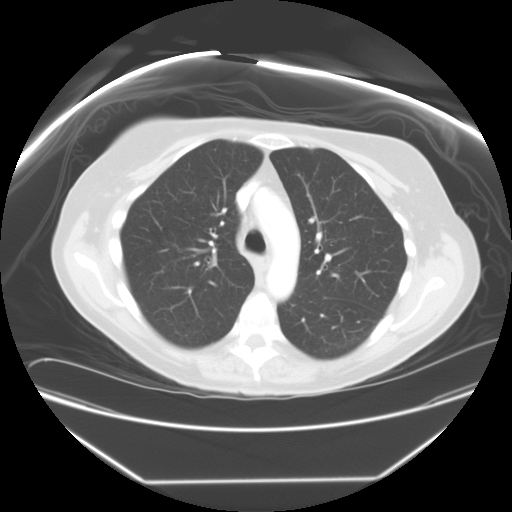
[im 48/62  lung]
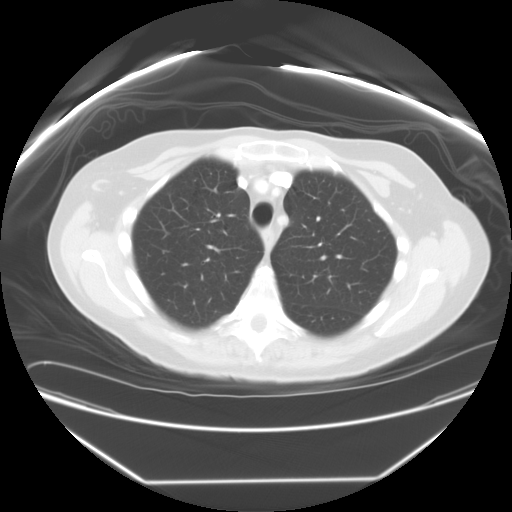
[im 52/62  lung]
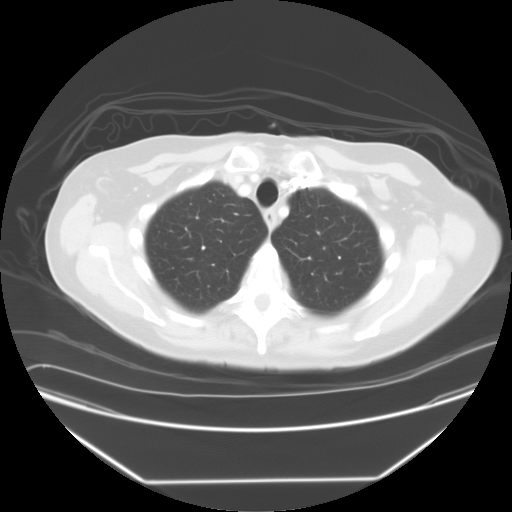
[im 57/62  lung]
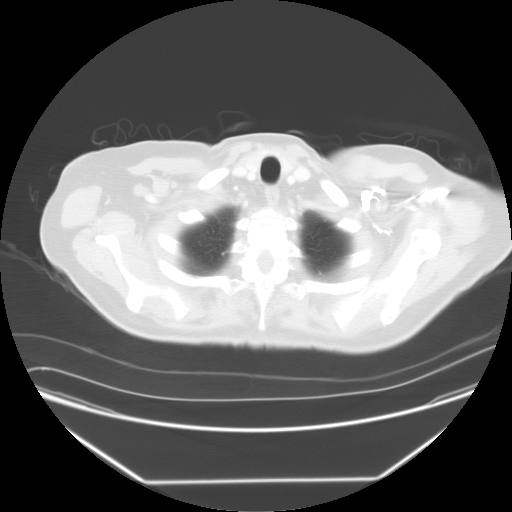

[Series 401: cor chest · coronal · 0.80mm/px · 3 of 80 slices shown]
[im 16/80  lung]
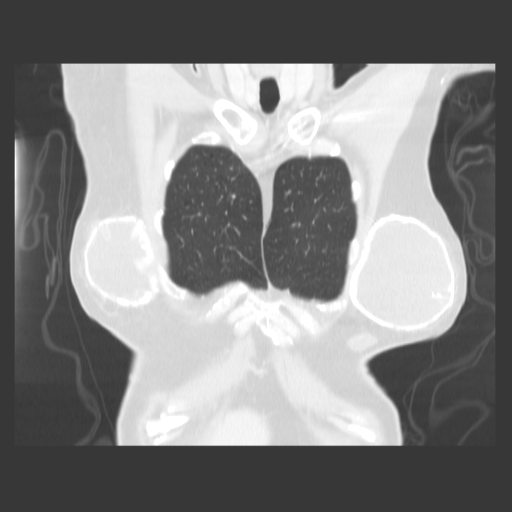
[im 32/80  lung]
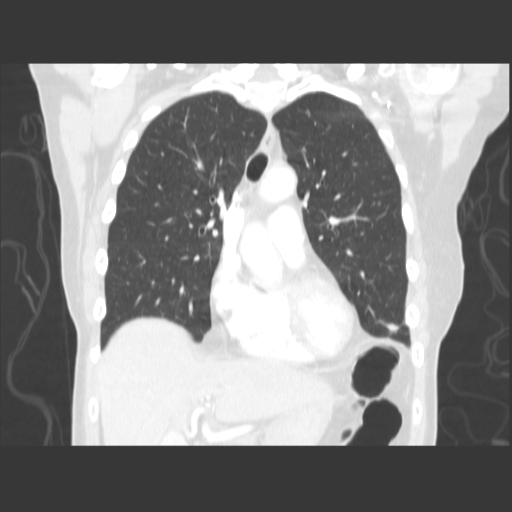
[im 48/80  lung]
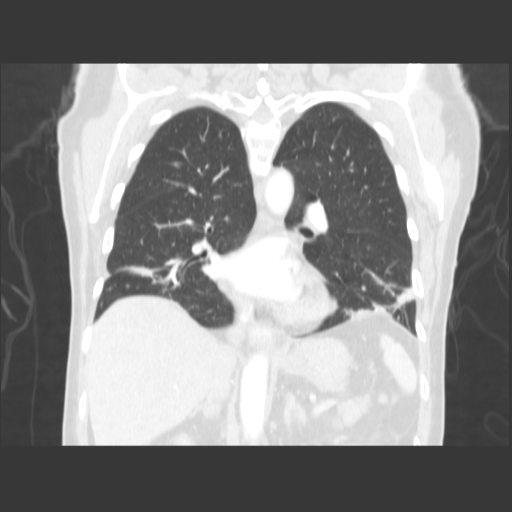

[15 of 36 positions shown; findings below may reference images not displayed]

FINDINGS: No pathologically enlarged mediastinal, hilar or axillary
lymph nodes.  Heart size normal.  No pericardial effusion. Breast
prostheses are calcified bilaterally.

A 1.1 x 1.2 cm nodule in the right lower lobe is seen peripherally
(image 37).  There is atelectasis and/or scarring in the lower
lobes bilaterally.  Scattered tiny pulmonary nodules are seen
peripherally and are nonspecific.  No pleural fluid.  Airway is
unremarkable.

Incidental imaging of the upper abdomen shows vicarious excretion
of contrast through the gallbladder.  There are unenlarged lymph
nodes in the upper abdomen.  No worrisome lytic or sclerotic
lesions.  There may be very mild compression of the T8 superior
endplate.  Pectus deformity.
IMPRESSION: 1.  Right lower lobe nodule has imaging features worrisome for
primary bronchogenic carcinoma.  Attention on tomorrow's PET CT is
recommended, although this nodule may be too small for PET
resolution.  In the absence of hypermetabolism, a 3-month follow-up
chest CT is recommended.
2.  Tiny scattered peripheral pulmonary nodules are nonspecific and
unlikely to represent metastatic disease.

## 2011-03-14 ENCOUNTER — Other Ambulatory Visit: Payer: Self-pay | Admitting: Oncology

## 2011-03-14 ENCOUNTER — Encounter (HOSPITAL_BASED_OUTPATIENT_CLINIC_OR_DEPARTMENT_OTHER): Payer: Self-pay | Admitting: Oncology

## 2011-03-14 ENCOUNTER — Ambulatory Visit (HOSPITAL_COMMUNITY)
Admission: RE | Admit: 2011-03-14 | Discharge: 2011-03-14 | Disposition: A | Discharge status: Home / Self Care | Payer: Self-pay | Source: Ambulatory Visit | Attending: Oncology | Admitting: Oncology

## 2011-03-14 DIAGNOSIS — R918 Other nonspecific abnormal finding of lung field: Secondary | ICD-10-CM | POA: Insufficient documentation

## 2011-03-14 DIAGNOSIS — J438 Other emphysema: Secondary | ICD-10-CM | POA: Insufficient documentation

## 2011-03-14 DIAGNOSIS — C189 Malignant neoplasm of colon, unspecified: Secondary | ICD-10-CM

## 2011-03-14 DIAGNOSIS — C180 Malignant neoplasm of cecum: Secondary | ICD-10-CM

## 2011-03-14 LAB — CBC WITH DIFFERENTIAL/PLATELET
Basophils Absolute: 0 10*3/uL (ref 0.0–0.1)
EOS%: 4.6 % (ref 0.0–7.0)
Eosinophils Absolute: 0.3 10*3/uL (ref 0.0–0.5)
LYMPH%: 32.4 % (ref 14.0–49.7)
MCH: 31.8 pg (ref 25.1–34.0)
MCV: 92.1 fL (ref 79.5–101.0)
MONO%: 6.5 % (ref 0.0–14.0)
Platelets: 264 10*3/uL (ref 145–400)
RBC: 3.95 10*6/uL (ref 3.70–5.45)
RDW: 13.5 % (ref 11.2–14.5)

## 2011-03-14 LAB — CMP (CANCER CENTER ONLY)
ALT(SGPT): 31 U/L (ref 10–47)
AST: 30 U/L (ref 11–38)
Albumin: 3.5 g/dL (ref 3.3–5.5)
Alkaline Phosphatase: 85 U/L — ABNORMAL HIGH (ref 26–84)
BUN, Bld: 16 mg/dL (ref 7–22)
CO2: 26 mEq/L (ref 18–33)
Calcium: 9 mg/dL (ref 8.0–10.3)
Chloride: 105 mEq/L (ref 98–108)
Creat: 0.8 mg/dl (ref 0.6–1.2)
Glucose, Bld: 127 mg/dL — ABNORMAL HIGH (ref 73–118)
Potassium: 4.8 mEq/L — ABNORMAL HIGH (ref 3.3–4.7)
Sodium: 143 mEq/L (ref 128–145)
Total Bilirubin: 0.4 mg/dl (ref 0.20–1.60)
Total Protein: 7 g/dL (ref 6.4–8.1)

## 2011-03-14 MED ORDER — IOHEXOL 300 MG/ML  SOLN
100.0000 mL | Freq: Once | INTRAMUSCULAR | Status: AC | PRN
  Administered 2011-03-14: 100 mL via INTRAVENOUS

## 2011-03-21 ENCOUNTER — Encounter (HOSPITAL_BASED_OUTPATIENT_CLINIC_OR_DEPARTMENT_OTHER): Payer: Self-pay | Admitting: Oncology

## 2011-03-21 DIAGNOSIS — J984 Other disorders of lung: Secondary | ICD-10-CM

## 2011-03-21 DIAGNOSIS — C180 Malignant neoplasm of cecum: Secondary | ICD-10-CM

## 2011-03-21 DIAGNOSIS — Z23 Encounter for immunization: Secondary | ICD-10-CM

## 2011-06-19 ENCOUNTER — Ambulatory Visit: Payer: Self-pay | Admitting: Internal Medicine

## 2011-06-19 DIAGNOSIS — Z79899 Other long term (current) drug therapy: Secondary | ICD-10-CM

## 2011-06-19 DIAGNOSIS — E789 Disorder of lipoprotein metabolism, unspecified: Secondary | ICD-10-CM

## 2011-06-19 DIAGNOSIS — C189 Malignant neoplasm of colon, unspecified: Secondary | ICD-10-CM

## 2011-06-19 DIAGNOSIS — F341 Dysthymic disorder: Secondary | ICD-10-CM

## 2011-06-30 ENCOUNTER — Other Ambulatory Visit: Payer: Self-pay | Admitting: Oncology

## 2011-06-30 DIAGNOSIS — C189 Malignant neoplasm of colon, unspecified: Secondary | ICD-10-CM

## 2011-09-12 ENCOUNTER — Telehealth: Payer: Self-pay | Admitting: *Deleted

## 2011-09-12 ENCOUNTER — Other Ambulatory Visit (HOSPITAL_BASED_OUTPATIENT_CLINIC_OR_DEPARTMENT_OTHER): Payer: Self-pay | Admitting: Lab

## 2011-09-12 ENCOUNTER — Ambulatory Visit (HOSPITAL_COMMUNITY)
Admission: RE | Admit: 2011-09-12 | Discharge: 2011-09-12 | Disposition: A | Discharge status: Home / Self Care | Payer: Self-pay | Source: Ambulatory Visit | Attending: Oncology | Admitting: Oncology

## 2011-09-12 DIAGNOSIS — R918 Other nonspecific abnormal finding of lung field: Secondary | ICD-10-CM | POA: Insufficient documentation

## 2011-09-12 DIAGNOSIS — M949 Disorder of cartilage, unspecified: Secondary | ICD-10-CM | POA: Insufficient documentation

## 2011-09-12 DIAGNOSIS — C180 Malignant neoplasm of cecum: Secondary | ICD-10-CM

## 2011-09-12 DIAGNOSIS — M899 Disorder of bone, unspecified: Secondary | ICD-10-CM | POA: Insufficient documentation

## 2011-09-12 DIAGNOSIS — Z9049 Acquired absence of other specified parts of digestive tract: Secondary | ICD-10-CM | POA: Insufficient documentation

## 2011-09-12 DIAGNOSIS — Z9221 Personal history of antineoplastic chemotherapy: Secondary | ICD-10-CM | POA: Insufficient documentation

## 2011-09-12 DIAGNOSIS — E785 Hyperlipidemia, unspecified: Secondary | ICD-10-CM

## 2011-09-12 DIAGNOSIS — J984 Other disorders of lung: Secondary | ICD-10-CM

## 2011-09-12 DIAGNOSIS — C189 Malignant neoplasm of colon, unspecified: Secondary | ICD-10-CM | POA: Insufficient documentation

## 2011-09-12 LAB — CBC WITH DIFFERENTIAL/PLATELET
BASO%: 0.7 % (ref 0.0–2.0)
EOS%: 4.3 % (ref 0.0–7.0)
LYMPH%: 29 % (ref 14.0–49.7)
MCH: 31.5 pg (ref 25.1–34.0)
MCHC: 34 g/dL (ref 31.5–36.0)
MCV: 92.7 fL (ref 79.5–101.0)
MONO%: 6.7 % (ref 0.0–14.0)
Platelets: 289 10*3/uL (ref 145–400)
RBC: 3.95 10*6/uL (ref 3.70–5.45)
WBC: 7.4 10*3/uL (ref 3.9–10.3)
nRBC: 0 % (ref 0–0)

## 2011-09-12 LAB — CMP (CANCER CENTER ONLY)
ALT(SGPT): 29 U/L (ref 10–47)
AST: 25 U/L (ref 11–38)
Creat: 0.9 mg/dl (ref 0.6–1.2)
Total Bilirubin: 0.5 mg/dl (ref 0.20–1.60)

## 2011-09-12 LAB — LACTATE DEHYDROGENASE: LDH: 162 U/L (ref 94–250)

## 2011-09-12 LAB — CEA: CEA: 1.6 ng/mL (ref 0.0–5.0)

## 2011-09-12 MED ORDER — IOHEXOL 300 MG/ML  SOLN
100.0000 mL | Freq: Once | INTRAMUSCULAR | Status: AC | PRN
  Administered 2011-09-12: 100 mL via INTRAVENOUS

## 2011-09-12 NOTE — Telephone Encounter (Signed)
Message copied by Sabino Snipes on Tue Sep 12, 2011  4:30 PM ------      Message from: Levert Feinstein      Created: Tue Sep 12, 2011  1:34 PM       Call pt CT negative

## 2011-09-12 NOTE — Telephone Encounter (Signed)
Pt. Notified that CT scan negative per Dr. Cyndie Chime.

## 2011-09-19 ENCOUNTER — Telehealth: Payer: Self-pay | Admitting: Oncology

## 2011-09-19 ENCOUNTER — Ambulatory Visit (HOSPITAL_BASED_OUTPATIENT_CLINIC_OR_DEPARTMENT_OTHER): Payer: Self-pay | Admitting: Oncology

## 2011-09-19 ENCOUNTER — Encounter: Payer: Self-pay | Admitting: Oncology

## 2011-09-19 VITALS — BP 115/74 | HR 76 | Temp 97.5°F | Ht 68.0 in | Wt 174.5 lb

## 2011-09-19 DIAGNOSIS — R918 Other nonspecific abnormal finding of lung field: Secondary | ICD-10-CM

## 2011-09-19 DIAGNOSIS — F341 Dysthymic disorder: Secondary | ICD-10-CM

## 2011-09-19 DIAGNOSIS — E785 Hyperlipidemia, unspecified: Secondary | ICD-10-CM

## 2011-09-19 DIAGNOSIS — Z85038 Personal history of other malignant neoplasm of large intestine: Secondary | ICD-10-CM

## 2011-09-19 DIAGNOSIS — F418 Other specified anxiety disorders: Secondary | ICD-10-CM

## 2011-09-19 HISTORY — DX: Other specified anxiety disorders: F41.8

## 2011-09-19 HISTORY — DX: Hyperlipidemia, unspecified: E78.5

## 2011-09-19 HISTORY — DX: Personal history of other malignant neoplasm of large intestine: Z85.038

## 2011-09-19 HISTORY — DX: Other nonspecific abnormal finding of lung field: R91.8

## 2011-09-19 NOTE — Telephone Encounter (Signed)
Gv pt appt for oct2013.  scheduled pt for ct and chest xray on 10/08 @ [facility]

## 2011-09-19 NOTE — Progress Notes (Signed)
Hematology and Oncology Follow Up Visit  Kelli [name] [id number] [birth year] 66 y.o. 09/19/2011 4:53 PM   Principle Diagnosis: Encounter Diagnoses  Name Primary?  . H/O colon cancer, stage II Yes  . Lung nodules   . Depression with anxiety   . Hyperlipidemia      Interim History:    Followup visit for this pleasant 66 year old woman diagnosed with a stage II, T3 N0, cancer of the cecum in September 2010.  She was treated with primary surgical resection followed by adjuvant chemotherapy with oral Xeloda.     She was a smoker right up until the time of her surgery.  She had a number of bilateral subcentimeter pulmonary nodules which we have followed closely.  Fortunately, none of these has shown any growth. She was able to stop smoking.   She is doing very well at this time.  She denies any abdominal pain or cramps.  No hematochezia.  She has gained some weight.  She reestablished herself with [facility] and saw Dr. Robert Bellow.  She admitted to him that she was depressed.  He put her on an antidepressant and she has found that this has helped dramatically.  She did not realize that she could feel so good.    Medications: reviewed  Allergies: No Known Allergies  Review of Systems: Constitutional:   No constitutional symptoms Respiratory: No cough or dyspnea Cardiovascular: No chest pain or palpitations  Gastrointestinal: See above Genito-Urinary: No urinary tract symptoms Musculoskeletal: No muscle or bone pain Neurologic: No headache or change in vision Skin: No rash Remaining ROS negative.  Physical Exam: Blood pressure 115/74, pulse 76, temperature 97.5 F (36.4 C), temperature source Oral, height 5\' 8"  (1.727 m), weight 174 lb 8 oz (79.153 kg). Wt Readings from Last 3 Encounters:  09/19/11 174 lb 8 oz (79.153 kg)     General appearance: Well-nourished Caucasian woman HENNT: Pharynx no erythema or exudate Lymph nodes: No lymphadenopathy Breasts: Not examined  today Lungs: Clear to auscultation resonant to percussion Heart: Regular rhythm no murmur Abdomen: Soft nontender no mass no organomegaly Extremities: No edema no calf tenderness Vascular: No cyanosis Neurologic: Mental status intact, cranial nerves intact, motor strength 5 over 5, reflexes 1+ symmetric. Skin: No rash or ecchymosis  Lab Results: Lab Results  Component Value Date   WBC 7.4 09/12/2011   HGB 12.5 09/12/2011   HCT 36.7 09/12/2011   MCV 92.7 09/12/2011   PLT 289 09/12/2011     Chemistry      Component Value Date/Time   NA 145 09/12/2011 0955   NA 141 03/14/2010 1247   K 4.3 09/12/2011 0955   K 3.7 03/14/2010 1247   CL 100 09/12/2011 0955   CL 107 03/14/2010 1247   CO2 29 09/12/2011 0955   CO2 27 03/14/2010 1247   BUN 14 09/12/2011 0955   BUN 11 03/14/2010 1247   CREATININE 0.9 09/12/2011 0955   CREATININE 0.78 03/14/2010 1247      Component Value Date/Time   CALCIUM 8.5 09/12/2011 0955   CALCIUM 9.2 03/14/2010 1247   ALKPHOS 88* 09/12/2011 0955   ALKPHOS 84 03/14/2010 1247   AST 25 09/12/2011 0955   AST 22 03/14/2010 1247   ALT 22 03/14/2010 1247   BILITOT 0.50 09/12/2011 0955   BILITOT 0.8 03/14/2010 1247       Radiological Studies: CT scan chest abdomen and pelvis done 09/12/2011 which I personally reviewed shows stable subcentimeter granulomas in the lungs and no abdominal or pelvic  pathology to suggest recurrent cancer.   Impression and Plan: #1. stage IIA adenocarcinoma of the cecum . She remains in remission now out 2-1/2 years from diagnosis. Plan: I will see her again in 6 months with lab and a CT scan prior to that visit. Subsequent to that think we can safely decrease frequency of followup CT scans and just follow her clinically.  #2. Multiple colon polyps. Last colonoscopy done September 2011. She is getting followup studies at 3 year intervals.  #3. Stable pulmonary granulomas.  #4. Depression resolved on medication  CC:. Dr. Thayer Ohm Guest; Dr. Avel Peace;  Dr. Rob Bunting   Levert Feinstein, MD 4/16/20134:53 PM

## 2011-09-20 ENCOUNTER — Encounter: Payer: Self-pay | Admitting: Oncology

## 2012-02-21 ENCOUNTER — Telehealth: Payer: Self-pay | Admitting: Oncology

## 2012-02-21 NOTE — Telephone Encounter (Signed)
S.W. pt and advised of est schedule change.    sed

## 2012-03-12 ENCOUNTER — Ambulatory Visit (HOSPITAL_COMMUNITY)
Admission: RE | Admit: 2012-03-12 | Discharge: 2012-03-12 | Disposition: A | Discharge status: Home / Self Care | Payer: Self-pay | Source: Ambulatory Visit | Attending: Oncology | Admitting: Oncology

## 2012-03-12 ENCOUNTER — Other Ambulatory Visit (HOSPITAL_BASED_OUTPATIENT_CLINIC_OR_DEPARTMENT_OTHER): Payer: Self-pay

## 2012-03-12 DIAGNOSIS — Z85038 Personal history of other malignant neoplasm of large intestine: Secondary | ICD-10-CM

## 2012-03-12 DIAGNOSIS — C189 Malignant neoplasm of colon, unspecified: Secondary | ICD-10-CM | POA: Insufficient documentation

## 2012-03-12 DIAGNOSIS — C180 Malignant neoplasm of cecum: Secondary | ICD-10-CM

## 2012-03-12 DIAGNOSIS — K7689 Other specified diseases of liver: Secondary | ICD-10-CM | POA: Insufficient documentation

## 2012-03-12 DIAGNOSIS — R918 Other nonspecific abnormal finding of lung field: Secondary | ICD-10-CM

## 2012-03-12 DIAGNOSIS — R911 Solitary pulmonary nodule: Secondary | ICD-10-CM | POA: Insufficient documentation

## 2012-03-12 LAB — CBC WITH DIFFERENTIAL/PLATELET
BASO%: 0.6 % (ref 0.0–2.0)
MCHC: 33.5 g/dL (ref 31.5–36.0)
MONO#: 0.4 10*3/uL (ref 0.1–0.9)
RBC: 4.03 10*6/uL (ref 3.70–5.45)
RDW: 13.3 % (ref 11.2–14.5)
WBC: 6.9 10*3/uL (ref 3.9–10.3)
lymph#: 2.3 10*3/uL (ref 0.9–3.3)

## 2012-03-12 LAB — COMPREHENSIVE METABOLIC PANEL (CC13)
ALT: 23 U/L (ref 0–55)
AST: 19 U/L (ref 5–34)
Albumin: 3.9 g/dL (ref 3.5–5.0)
Alkaline Phosphatase: 84 U/L (ref 40–150)
Calcium: 9.4 mg/dL (ref 8.4–10.4)
Chloride: 104 mEq/L (ref 98–107)
Potassium: 4.2 mEq/L (ref 3.5–5.1)

## 2012-03-12 MED ORDER — IOHEXOL 300 MG/ML  SOLN
100.0000 mL | Freq: Once | INTRAMUSCULAR | Status: AC | PRN
  Administered 2012-03-12: 100 mL via INTRAVENOUS

## 2012-03-13 LAB — CEA: CEA: 1.6 ng/mL (ref 0.0–5.0)

## 2012-03-19 ENCOUNTER — Ambulatory Visit: Payer: Self-pay | Admitting: Oncology

## 2012-03-22 ENCOUNTER — Ambulatory Visit (HOSPITAL_BASED_OUTPATIENT_CLINIC_OR_DEPARTMENT_OTHER): Payer: Self-pay | Admitting: Oncology

## 2012-03-22 ENCOUNTER — Telehealth: Payer: Self-pay | Admitting: Oncology

## 2012-03-22 VITALS — BP 113/69 | HR 73 | Temp 97.9°F | Resp 20 | Ht 68.0 in | Wt 179.7 lb

## 2012-03-22 DIAGNOSIS — D126 Benign neoplasm of colon, unspecified: Secondary | ICD-10-CM

## 2012-03-22 DIAGNOSIS — R918 Other nonspecific abnormal finding of lung field: Secondary | ICD-10-CM

## 2012-03-22 DIAGNOSIS — F3289 Other specified depressive episodes: Secondary | ICD-10-CM

## 2012-03-22 DIAGNOSIS — F329 Major depressive disorder, single episode, unspecified: Secondary | ICD-10-CM

## 2012-03-22 DIAGNOSIS — Z85038 Personal history of other malignant neoplasm of large intestine: Secondary | ICD-10-CM

## 2012-03-22 DIAGNOSIS — R911 Solitary pulmonary nodule: Secondary | ICD-10-CM

## 2012-03-22 DIAGNOSIS — C180 Malignant neoplasm of cecum: Secondary | ICD-10-CM

## 2012-03-22 NOTE — Telephone Encounter (Signed)
gv and printed appt schedule for pt for OCT 2014...emailed Dr. Cyndie Chime to see if pt still needed dg chest bc she stated she already had ct.

## 2012-03-22 NOTE — Progress Notes (Signed)
Hematology and Oncology Follow Up Visit  Kelli [name] [id number] 1946/02/20 66 y.o. 03/22/2012 7:26 PM   Principle Diagnosis: Encounter Diagnoses  Name Primary?  . H/O colon cancer, stage II Yes  . Lung nodules      Interim History:   A follow-up visit for this pleasant 66 year old woman diagnosed with a stage IIA adenocarcinoma of the cecum in September of 2010 when she presented with progressive abdominal pain and change in bowel habit.  Colonoscopy showed multiple polyps, approximately ten, as well as a large malignant mass in the cecum.  Pathology with poorly differentiated adenocarcinoma.  She underwent a right hemicolectomy with partial omentectomy on February 15, 2009.  She was found to have an 8 cm poorly differentiated adenocarcinoma.  No vascular, lymphatic or perineural invasion.  Thirty-three lymph nodes negative.  Preoperative CEA normal at 1.5.  Pathologic T3 N0 M0 tumor.  She received adjuvant chemotherapy with Xeloda. She was a heavy smoker. She had some small, bilateral, pulmonary nodules. These have been followed over time and have not grown. She was able to stop smoking.  She is doing well. She is concerned that she has gained about 50 pounds since her surgery.  She is having no abdominal pain. No change in bowel habits. No hematochezia or melena.  Most recent colonoscopy done on the one-year anniversary of her diagnosis 02/11/2010. A few small additional benign polyps were removed at that time.  Medications: reviewed  Allergies: No Known Allergies  Review of Systems: Constitutional:  No constitutional symptoms  Respiratory: No cough or dyspnea Cardiovascular:  No chest pain or palpitations Gastrointestinal: See above Genito-Urinary: No urinary tract symptoms Musculoskeletal: No muscle or bone pain Neurologic: No headache or change in vision Skin: No rash or ecchymosis Remaining ROS negative.  Physical Exam: Blood pressure 113/69, pulse 73, temperature 97.9  F (36.6 C), temperature source Oral, resp. rate 20, height 5\' 8"  (1.727 m), weight 179 lb 11.2 oz (81.511 kg). Wt Readings from Last 3 Encounters:  03/22/12 179 lb 11.2 oz (81.511 kg)  09/19/11 174 lb 8 oz (79.153 kg)     General appearance: Well-nourished Caucasian woman HENNT: Pharynx no erythema or exudate Lymph nodes: No adenopathy Breasts: Not examined Lungs: Clear to auscultation resonant to percussion Heart: Regular rhythm no murmur Abdomen: Soft, nontender, no mass, no organomegaly Extremities: No edema, no calf tenderness Vascular: No cyanosis Neurologic: Motor strength 5 over 5, reflexes 2+ symmetric Skin: No rash or ecchymosis  Lab Results: Lab Results  Component Value Date   WBC 6.9 03/12/2012   HGB 12.4 03/12/2012   HCT 37.0 03/12/2012   MCV 91.8 03/12/2012   PLT 286 03/12/2012     Chemistry      Component Value Date/Time   NA 137 03/12/2012 0924   NA 145 09/12/2011 0955   NA 141 03/14/2010 1247   K 4.2 03/12/2012 0924   K 4.3 09/12/2011 0955   K 3.7 03/14/2010 1247   CL 104 03/12/2012 0924   CL 100 09/12/2011 0955   CL 107 03/14/2010 1247   CO2 20* 03/12/2012 0924   CO2 29 09/12/2011 0955   CO2 27 03/14/2010 1247   BUN 18.0 03/12/2012 0924   BUN 14 09/12/2011 0955   BUN 11 03/14/2010 1247   CREATININE 0.9 03/12/2012 0924   CREATININE 0.9 09/12/2011 0955   CREATININE 0.78 03/14/2010 1247      Component Value Date/Time   CALCIUM 9.4 03/12/2012 0924   CALCIUM 8.5 09/12/2011 0955   CALCIUM 9.2  03/14/2010 1247   ALKPHOS 84 03/12/2012 0924   ALKPHOS 88* 09/12/2011 0955   ALKPHOS 84 03/14/2010 1247   AST 19 03/12/2012 0924   AST 25 09/12/2011 0955   AST 22 03/14/2010 1247   ALT 23 03/12/2012 0924   ALT 22 03/14/2010 1247   BILITOT 0.40 03/12/2012 0924   BILITOT 0.50 09/12/2011 0955   BILITOT 0.8 03/14/2010 1247       Radiological Studies: Dg Chest 2 View  03/12/2012  *RADIOLOGY REPORT*  Clinical Data: Colon cancer.  Lung nodule follow-up.  CHEST - 2 VIEW  Comparison: CT  09/12/2011  Findings: Heart size is normal.  Negative for heart failure. Negative for pneumonia.  Lungs are clear.  No mass or nodule is identified.  Small lung nodules seen on the prior CT are not identified on the chest x-ray. CT followup of these nodules is suggested.  IMPRESSION: No active cardiopulmonary disease.   Original Report Authenticated By: Camelia Phenes, M.D.    Ct Abdomen W Contrast  03/12/2012  *RADIOLOGY REPORT*  Clinical Data: Follow up of colon cancer diagnosed 9/10 with resection.  Chemotherapy completed.  Currently in remission.  CT ABDOMEN WITH CONTRAST  Technique:  Multidetector CT imaging of the abdomen was performed following the standard protocol during bolus administration of intravenous contrast.  Contrast: OMNIPAQUE IOHEXOL 300 MG/ML  SOLN  Comparison: 09/12/2011  Findings: Right lower lobe lung nodule which measures 1.4 cm on image 1 and is unchanged. Mild linear scarring more inferiorly in the right lung base. 3 mm subpleural right lower lobe nodule on image 6 which is not definitely present on the prior. Subpleural left lower lobe 4 mm nodule on image 4 which is unchanged.  Incompletely imaged probable sebaceous cyst in the anterior left chest wall.  2.2 cm. Normal heart size without pericardial or pleural effusion.  Mild hepatic steatosis.  A 6 mm lateral segment left liver lobe lesion is unchanged and likely a cyst.  Normal spleen, stomach pancreas, gallbladder, biliary tract, adrenal glands, kidneys.  No retroperitoneal or retrocrural adenopathy.  Partial right hemicolectomy. Normal abdominal small bowel without ascites. No evidence of omental or peritoneal disease. Mild osteopenia.  IMPRESSION: 1.  Partial right hemicolectomy, without evidence of acute process or metastatic disease in the abdomen. 2.  The pelvis was not imaged. 3.  Redemonstration of lung base nodules.  A 3 mm nodule in the subpleural right lower lobe is not definitely present on the prior exam. Favored  to represent a subpleural lymph node. Recommend attention on follow-up. 4.  Hepatic steatosis.   Original Report Authenticated By: Consuello Bossier, M.D.     Impression and Plan: #1. Stage II, T3, N0, M0 cancer of the cecum treated as outlined above No evidence for recurrence now out 3 years from diagnosis.  Plan: Continued annual followup Since 3 year cancer free survival predicts five-year survival, I'm not going to get any more  CAT scans except as indicated by new clinical symptoms or change in physical exam.  #2. Multiple colonic polyps.  #3. Bilateral subcentimeter pulmonary nodules stable over time.  #4. Depression. Resolved on antidepressant    CC: Dr. Thayer Ohm Guest; Dr. Wendall Papa    Levert Feinstein, MD 10/18/20137:26 PM

## 2012-06-24 ENCOUNTER — Ambulatory Visit: Payer: Self-pay | Admitting: Internal Medicine

## 2012-08-01 ENCOUNTER — Telehealth: Payer: Self-pay | Admitting: Oncology

## 2012-08-01 NOTE — Telephone Encounter (Signed)
spoke w/ pt gv appts d/t...td °

## 2012-08-05 ENCOUNTER — Encounter: Payer: Self-pay | Admitting: Internal Medicine

## 2012-08-05 ENCOUNTER — Ambulatory Visit: Payer: Self-pay | Admitting: Internal Medicine

## 2012-08-05 VITALS — BP 110/70 | HR 72 | Temp 98.0°F | Resp 12 | Ht 68.75 in | Wt 175.4 lb

## 2012-08-05 DIAGNOSIS — E789 Disorder of lipoprotein metabolism, unspecified: Secondary | ICD-10-CM

## 2012-08-05 DIAGNOSIS — R739 Hyperglycemia, unspecified: Secondary | ICD-10-CM

## 2012-08-05 DIAGNOSIS — R635 Abnormal weight gain: Secondary | ICD-10-CM

## 2012-08-05 DIAGNOSIS — R5381 Other malaise: Secondary | ICD-10-CM

## 2012-08-05 DIAGNOSIS — R7309 Other abnormal glucose: Secondary | ICD-10-CM

## 2012-08-05 MED ORDER — PRAVASTATIN SODIUM 40 MG PO TABS: 40.0000 mg | ORAL_TABLET | Freq: Every day | ORAL | Status: DC

## 2012-08-05 MED ORDER — FLUOXETINE HCL 20 MG PO CAPS: 30.0000 mg | ORAL_CAPSULE | Freq: Every day | ORAL | Status: DC

## 2012-08-05 NOTE — Progress Notes (Signed)
  Subjective:    Patient ID: Kelli [name], female    DOB: 03/22/1946, 67 y.o.   MRN: [id number]  HPI Fatigue and weight gain, mid hyperglycemia. Gained 25lbs with skin and hair changes. Agrees to limited labs is self pay.    Review of Systems Will do cpe when on medicare this summer.    Objective:   Physical Exam   TSH     Assessment & Plan:  RF meds 6 months

## 2012-08-05 NOTE — Patient Instructions (Signed)
Thyroid Diseases Your thyroid is a butterfly-shaped gland in your neck. It is located just above your collarbone. It is one of your endocrine glands, which make hormones. The thyroid helps set your metabolism. Metabolism is how your body gets energy from the foods you eat.  Millions of people have thyroid diseases. Women experience thyroid problems more often than men. In fact, overactive thyroid problems (hyperthyroidism) occur in 1% of all women. If you have a thyroid disease, your body may use energy more slowly or quickly than it should.  Thyroid problems also include an immune disease where your body reacts against your thyroid gland (called thyroiditis). A different problem involves lumps and bumps (called nodules) that develop in the gland. The nodules are usually, but not always, noncancerous. THE MOST COMMON THYROID PROBLEMS AND CAUSES ARE DISCUSSED BELOW There are many causes for thyroid problems. Treatment depends upon the exact diagnosis and includes trying to reset your body's metabolism to a normal rate. Hyperthyroidism Too much thyroid hormone from an overactive thyroid gland is called hyperthyroidism. In hyperthyroidism, the body's metabolism speeds up. One of the most frequent forms of hyperthyroidism is known as Graves' disease. Graves' disease tends to run in families. Although Graves' is thought to be caused by a problem with the immune system, the exact nature of the genetic problem is unknown. Hypothyroidism Too little thyroid hormone from an underactive thyroid gland is called hypothyroidism. In hypothyroidism, the body's metabolism is slowed. Several things can cause this condition. Most causes affect the thyroid gland directly and hurt its ability to make enough hormone.  Rarely, there may be a pituitary gland tumor (located near the base of the brain). The tumor can block the pituitary from producing thyroid-stimulating hormone (TSH). Your body makes TSH to stimulate the thyroid  to work properly. If the pituitary does not make enough TSH, the thyroid fails to make enough hormones needed for good health. Whether the problem is caused by thyroid conditions or by the pituitary gland, the result is that the thyroid is not making enough hormones. Hypothyroidism causes many physical and mental processes to become sluggish. The body consumes less oxygen and produces less body heat. Thyroid Nodules A thyroid nodule is a small swelling or lump in the thyroid gland. They are common. These nodules represent either a growth of thyroid tissue or a fluid-filled cyst. Both form a lump in the thyroid gland. Almost half of all people will have tiny thyroid nodules at some point in their lives. Typically, these are not noticeable until they become large and affect normal thyroid size. Larger nodules that are greater than a half inch across (about 1 centimeter) occur in about 5 percent of people. Although most nodules are not cancerous, people who have them should seek medical care to rule out cancer. Also, some thyroid nodules may produce too much thyroid hormone or become too large. Large nodules or a large gland can interfere with breathing or swallowing or may cause neck discomfort. Other problems Other thyroid problems include cancer and thyroiditis. Thyroiditis is a malfunction of the body's immune system. Normally, the immune system works to defend the body against infection and other problems. When the immune system is not working properly, it may mistakenly attack normal cells, tissues, and organs. Examples of autoimmune diseases are Hashimoto's thyroiditis (which causes low thyroid function) and Graves' disease (which causes excess thyroid function). SYMPTOMS  Symptoms vary greatly depending upon the exact type of problem with the thyroid. Hyperthyroidism-is when your thyroid is too   active and makes more thyroid hormone than your body needs. The most common cause is Graves' Disease. Too  much thyroid hormone can cause some or all of the following symptoms:  Anxiety.  Irritability.  Difficulty sleeping.  Fatigue.  A rapid or irregular heartbeat.  A fine tremor of your hands or fingers.  An increase in perspiration.  Sensitivity to heat.  Weight loss, despite normal food intake.  Brittle hair.  Enlargement of your thyroid gland (goiter).  Light menstrual periods.  Frequent bowel movements. Graves' disease can specifically cause eye and skin problems. The skin problems involve reddening and swelling of the skin, often on your shins and on the top of your feet. Eye problems can include the following:  Excess tearing and sensation of grit or sand in either or both eyes.  Reddened or inflamed eyes.  Widening of the space between your eyelids.  Swelling of the lids and tissues around the eyes.  Light sensitivity.  Ulcers on the cornea.  Double vision.  Limited eye movements.  Blurred or reduced vision. Hypothyroidism- is when your thyroid gland is not active enough. This is more common than hyperthyroidism. Symptoms can vary a lot depending of the severity of the hormone deficiency. Symptoms may develop over a long period of time and can include several of the following:  Fatigue.  Sluggishness.  Increased sensitivity to cold.  Constipation.  Pale, dry skin.  A puffy face.  Hoarse voice.  High blood cholesterol level.  Unexplained weight gain.  Muscle aches, tenderness and stiffness.  Pain, stiffness or swelling in your joints.  Muscle weakness.  Heavier than normal menstrual periods.  Brittle fingernails and hair.  Depression. Thyroid Nodules - most do not cause signs or symptoms. Occasionally, some may become so large that you can feel or even see the swelling at the base of your neck. You may realize a lump or swelling is there when you are shaving or putting on makeup. Men might become aware of a nodule when shirt collars  suddenly feel too tight. Some nodules produce too much thyroid hormone. This can produce the same symptoms as hyperthyroidism (see above). Thyroid nodules are seldom cancerous. However, a nodule is more likely to be malignant (cancerous) if it:  Grows quickly or feels hard.  Causes you to become hoarse or to have trouble swallowing or breathing.  Causes enlarged lymph nodes under your jaw or in your neck. DIAGNOSIS  Because there are so many possible thyroid conditions, your caregiver may ask for a number of tests. They will do this in order to narrow down the exact diagnosis. These tests can include:  Blood and antibody tests.  Special thyroid scans using small, safe amounts of radioactive iodine.  Ultrasound of the thyroid gland (particularly if there is a nodule or lump).  Biopsy. This is usually done with a special needle. A needle biopsy is a procedure to obtain a sample of cells from the thyroid. The tissue will be tested in a lab and examined under a microscope. TREATMENT  Treatment depends on the exact diagnosis. Hyperthyroidism  Beta-blockers help relieve many of the symptoms.  Anti-thyroid medications prevent the thyroid from making excess hormones.  Radioactive iodine treatment can destroy overactive thyroid cells. The iodine can permanently decrease the amount of hormone produced.  Surgery to remove the thyroid gland.  Treatments for eye problems that come from Graves' disease also include medications and special eye surgery, if felt to be appropriate. Hypothyroidism Thyroid replacement with levothyroxine is   the mainstay of treatment. Treatment with thyroid replacement is usually lifelong and will require monitoring and adjustment from time to time. Thyroid Nodules  Watchful waiting. If a small nodule causes no symptoms or signs of cancer on biopsy, then no treatment may be chosen at first. Re-exam and re-checking blood tests would be the recommended  follow-up.  Anti-thyroid medications or radioactive iodine treatment may be recommended if the nodules produce too much thyroid hormone (see Treatment for Hyperthyroidism above).  Alcohol ablation. Injections of small amounts of ethyl alcohol (ethanol) can cause a non-cancerous nodule to shrink in size.  Surgery (see Treatment for Hyperthyroidism above). HOME CARE INSTRUCTIONS   Take medications as instructed.  Follow through on recommended testing. SEEK MEDICAL CARE IF:   You feel that you are developing symptoms of Hyperthyroidism or Hypothyroidism as described above.  You develop a new lump/nodule in the neck/thyroid area that you had not noticed before.  You feel that you are having side effects from medicines prescribed.  You develop trouble breathing or swallowing. SEEK IMMEDIATE MEDICAL CARE IF:   You develop a fever of 102 F (38.9 C) or higher.  You develop severe sweating.  You develop palpitations and/or rapid heart beat.  You develop shortness of breath.  You develop nausea and vomiting.  You develop extreme shakiness.  You develop agitation.  You develop lightheadedness or have a fainting episode. Document Released: 03/19/2007 Document Revised: 08/14/2011 Document Reviewed: 03/19/2007 [employer] Patient Information 2013 [employer].  

## 2012-08-06 ENCOUNTER — Encounter: Payer: Self-pay | Admitting: Radiology

## 2013-01-08 ENCOUNTER — Encounter: Payer: Self-pay | Admitting: Gastroenterology

## 2013-03-21 ENCOUNTER — Other Ambulatory Visit (HOSPITAL_COMMUNITY): Payer: Self-pay

## 2013-03-28 ENCOUNTER — Other Ambulatory Visit: Payer: Self-pay | Admitting: Lab

## 2013-03-28 ENCOUNTER — Other Ambulatory Visit: Payer: Self-pay | Admitting: *Deleted

## 2013-03-28 ENCOUNTER — Ambulatory Visit: Payer: Self-pay | Admitting: Oncology

## 2013-03-28 DIAGNOSIS — Z85038 Personal history of other malignant neoplasm of large intestine: Secondary | ICD-10-CM

## 2013-03-31 ENCOUNTER — Telehealth: Payer: Self-pay | Admitting: Oncology

## 2013-03-31 ENCOUNTER — Other Ambulatory Visit (HOSPITAL_BASED_OUTPATIENT_CLINIC_OR_DEPARTMENT_OTHER): Payer: Medicare Other | Admitting: Lab

## 2013-03-31 ENCOUNTER — Ambulatory Visit (HOSPITAL_BASED_OUTPATIENT_CLINIC_OR_DEPARTMENT_OTHER): Payer: Medicare Other | Admitting: Oncology

## 2013-03-31 VITALS — BP 117/78 | HR 81 | Temp 97.0°F | Resp 18 | Ht 68.0 in | Wt 175.9 lb

## 2013-03-31 DIAGNOSIS — R918 Other nonspecific abnormal finding of lung field: Secondary | ICD-10-CM

## 2013-03-31 DIAGNOSIS — Z85038 Personal history of other malignant neoplasm of large intestine: Secondary | ICD-10-CM

## 2013-03-31 DIAGNOSIS — K921 Melena: Secondary | ICD-10-CM

## 2013-03-31 DIAGNOSIS — C180 Malignant neoplasm of cecum: Secondary | ICD-10-CM

## 2013-03-31 LAB — CBC WITH DIFFERENTIAL/PLATELET
BASO%: 0.5 % (ref 0.0–2.0)
Basophils Absolute: 0 10*3/uL (ref 0.0–0.1)
HCT: 36.4 % (ref 34.8–46.6)
HGB: 12.2 g/dL (ref 11.6–15.9)
LYMPH%: 30.5 % (ref 14.0–49.7)
MCH: 31 pg (ref 25.1–34.0)
MCHC: 33.5 g/dL (ref 31.5–36.0)
MONO#: 0.3 10*3/uL (ref 0.1–0.9)
NEUT%: 61.6 % (ref 38.4–76.8)
Platelets: 267 10*3/uL (ref 145–400)
WBC: 6.4 10*3/uL (ref 3.9–10.3)

## 2013-03-31 LAB — COMPREHENSIVE METABOLIC PANEL (CC13)
ALT: 23 U/L (ref 0–55)
Anion Gap: 9 mEq/L (ref 3–11)
BUN: 11.1 mg/dL (ref 7.0–26.0)
CO2: 23 mEq/L (ref 22–29)
Calcium: 9.2 mg/dL (ref 8.4–10.4)
Chloride: 108 mEq/L (ref 98–109)
Creatinine: 0.9 mg/dL (ref 0.6–1.1)
Total Bilirubin: 0.36 mg/dL (ref 0.20–1.20)

## 2013-03-31 LAB — LACTATE DEHYDROGENASE (CC13): LDH: 159 U/L (ref 125–245)

## 2013-03-31 NOTE — Telephone Encounter (Signed)
gv and printed appt sched and avs forpt fro OCT 2015

## 2013-04-01 ENCOUNTER — Telehealth: Payer: Self-pay

## 2013-04-01 DIAGNOSIS — K921 Melena: Secondary | ICD-10-CM | POA: Insufficient documentation

## 2013-04-01 LAB — CEA: CEA: 1.7 ng/mL (ref 0.0–5.0)

## 2013-04-01 NOTE — Progress Notes (Signed)
Hematology and Oncology Follow Up Visit  Kelli [name] [id number] 04-08-1946 67 y.o. 04/01/2013 8:20 AM   Principle Diagnosis: Encounter Diagnosis  Name Primary?  . H/O colon cancer, stage II Yes     Interim History:  Follow-up visit for this pleasant 67 year old woman diagnosed with a stage IIA adenocarcinoma of the cecum in September of 2010 when she presented with progressive abdominal pain and change in bowel habit. Colonoscopy showed multiple polyps, approximately ten, as well as a large malignant mass in the cecum. Pathology with poorly differentiated adenocarcinoma. She underwent a right hemicolectomy with partial omentectomy on February 15, 2009. She was found to have an 8 cm poorly differentiated adenocarcinoma. No vascular, lymphatic or perineural invasion. Thirty-three lymph nodes negative. Preoperative CEA normal at 1.5. Pathologic T3 N0 M0 tumor. She received adjuvant chemotherapy with Xeloda.  She was a heavy smoker. She had some small, bilateral, pulmonary nodules. These have been followed over time and have not grown. She was able to stop smoking. Overall she is doing well. She relates an episode of bloody diarrhea occurring one month ago which she relates to "food poisoning" from fish that she ate at a [employer]. She has had some intermittent low-grade bleeding since that time as well. No abdominal pain or cramps. She has not had a colonoscopy since September 2011. She will go ahead and schedule a followup study at this time. No other interim medical problems.    Medications: reviewed  Allergies: No Known Allergies  Review of Systems: Hematology: negative for swollen glands, easy bruising ENT ROS: negative for - oral lesions or sore throat Respiratory ROS: negative for - cough, pleuritic pain, shortness of breath or wheezing Cardiovascular ROS: negative for - chest pain, dyspnea on exertion, edema, irregular heartbeat, murmur, orthopnea, palpitations, paroxysmal  nocturnal dyspnea or rapid heart rate Gastrointestinal ROS: See above   Genito-Urinary ROS: negative for - dysuria, hematuria, incontinence  or urinary frequency/urgency Musculoskeletal ROS: negative for - joint pain, joint stiffness, joint swelling, muscle pain, muscular weakness  Neurological ROS: negative for - behavioral changes, confusion, dizziness, gait disturbance, headaches, impaired coordination/balance, memory loss, numbness/tingling,  Dermatological ROS: negative for rash, ecchymosis Remaining ROS negative.  Physical Exam: Blood pressure 117/78, pulse 81, temperature 97 F (36.1 C), temperature source Oral, resp. rate 18, height 5\' 8"  (1.727 m), weight 175 lb 14.4 oz (79.788 kg), SpO2 100.00%. Wt Readings from Last 3 Encounters:  03/31/13 175 lb 14.4 oz (79.788 kg)  08/05/12 175 lb 6.4 oz (79.561 kg)  03/22/12 179 lb 11.2 oz (81.511 kg)     General appearance: Well-nourished Caucasian woman HENNT: Pharynx no erythema, exudate, mass, or ulcer. No thyromegaly or thyroid nodules Lymph nodes: No cervical, supraclavicular, or axillary lymphadenopathy Breasts Lungs: Clear to auscultation, resonant to percussion throughout Heart: Regular rhythm, no murmur, no gallop, no rub, no click, no edema Abdomen: Soft, nontender, normal bowel sounds, no mass, no organomegaly. Rectal exam: No mass. Stool brown and guaiac negative. Extremities: No edema, no calf tenderness Musculoskeletal: no joint deformities GU:  Vascular: Carotid pulses 2+, no bruits,  Neurologic: Alert, oriented, PERRLA, , cranial nerves grossly normal, motor strength 5 over 5, reflexes 1+ symmetric, upper body coordination normal, gait normal, Skin: No rash or ecchymosis  Lab Results: CBC W/Diff    Component Value Date/Time   WBC 6.4 03/31/2013 1341   WBC 8.7 02/20/2009 0605   RBC 3.94 03/31/2013 1341   RBC 3.81* 02/20/2009 0605   HGB 12.2 03/31/2013 1341   HGB 10.9*  02/20/2009 0605   HCT 36.4 03/31/2013 1341    HCT 32.6* 02/20/2009 0605   PLT 267 03/31/2013 1341   PLT 387 02/20/2009 0605   MCV 92.4 03/31/2013 1341   MCV 85.5 02/20/2009 0605   MCH 31.0 03/31/2013 1341   MCH 31.5 03/14/2010 1247   MCHC 33.5 03/31/2013 1341   MCHC 33.5 02/20/2009 0605   RDW 12.8 03/31/2013 1341   RDW 15.0 02/20/2009 0605   LYMPHSABS 1.9 03/31/2013 1341   LYMPHSABS 3.0 02/09/2009 1944   MONOABS 0.3 03/31/2013 1341   MONOABS 0.7 02/09/2009 1944   EOSABS 0.2 03/31/2013 1341   EOSABS 0.4 02/09/2009 1944   BASOSABS 0.0 03/31/2013 1341   BASOSABS 0.0 02/09/2009 1944     Chemistry      Component Value Date/Time   NA 140 03/31/2013 1341   NA 145 09/12/2011 0955   NA 141 03/14/2010 1247   K 3.9 03/31/2013 1341   K 4.3 09/12/2011 0955   K 3.7 03/14/2010 1247   CL 104 03/12/2012 0924   CL 100 09/12/2011 0955   CL 107 03/14/2010 1247   CO2 23 03/31/2013 1341   CO2 29 09/12/2011 0955   CO2 27 03/14/2010 1247   BUN 11.1 03/31/2013 1341   BUN 14 09/12/2011 0955   BUN 11 03/14/2010 1247   CREATININE 0.9 03/31/2013 1341   CREATININE 0.9 09/12/2011 0955   CREATININE 0.78 03/14/2010 1247      Component Value Date/Time   CALCIUM 9.2 03/31/2013 1341   CALCIUM 8.5 09/12/2011 0955   CALCIUM 9.2 03/14/2010 1247   ALKPHOS 85 03/31/2013 1341   ALKPHOS 88* 09/12/2011 0955   ALKPHOS 84 03/14/2010 1247   AST 20 03/31/2013 1341   AST 25 09/12/2011 0955   AST 22 03/14/2010 1247   ALT 23 03/31/2013 1341   ALT 29 09/12/2011 0955   ALT 22 03/14/2010 1247   BILITOT 0.36 03/31/2013 1341   BILITOT 0.50 09/12/2011 0955   BILITOT 0.8 03/14/2010 1247      Impression:  #1. Stage II, T3, N0, M0 cancer of the cecum No gross evidence for recurrence now 4 years from diagnosis and treatment. I am concerned with her intermittent hematochezia. Rectal exam is normal today and stools are guaiac negative. No fall in her hemoglobin from her baseline. She understands that she needs to schedule a colonoscopy at this time.  #2. Status post removal of multiple colon  polyps  #3. History of bilateral subcentimeter pulmonary nodules  #4. History of depression resolved on antidepressant medication    CC: Patient Care Team: Jonita Albee, MD as PCP - General (Internal Medicine)   Levert Feinstein, MD 10/28/20148:20 AM

## 2013-04-01 NOTE — Telephone Encounter (Signed)
Pt will call back to schedule after she gets her insurance in order.  She is not interested in setting anything up until that is taken care of

## 2013-04-01 NOTE — Telephone Encounter (Signed)
Kelli [name], We sent her a reminder letter 2 months ago about repeat colonoscopy but have not heard back from her. Thanks for reminding her in the office. We'll call her to get it scheduled. Kelli [name], Can you call her. She needs colonoscopy (LEC, moderate sedation) for h/o colon cancer Thanks ----- Message ----- From: Levert Feinstein, MD Sent: 04/01/2013 8:32 AM To: Rachael Fee, MD, Jonita Albee, MD New intermittent hematochezia - she needs follow up colonoscopy

## 2013-08-02 ENCOUNTER — Encounter: Payer: Self-pay | Admitting: Oncology

## 2013-09-18 ENCOUNTER — Other Ambulatory Visit: Payer: Self-pay | Admitting: Internal Medicine

## 2013-10-24 ENCOUNTER — Other Ambulatory Visit: Payer: Self-pay | Admitting: Physician Assistant

## 2013-10-28 ENCOUNTER — Ambulatory Visit (INDEPENDENT_AMBULATORY_CARE_PROVIDER_SITE_OTHER): Payer: Medicare Other | Admitting: Internal Medicine

## 2013-10-28 VITALS — BP 132/80 | HR 76 | Temp 98.0°F | Resp 17 | Ht 68.5 in | Wt 174.0 lb

## 2013-10-28 DIAGNOSIS — Z7189 Other specified counseling: Secondary | ICD-10-CM

## 2013-10-28 DIAGNOSIS — Z85038 Personal history of other malignant neoplasm of large intestine: Secondary | ICD-10-CM

## 2013-10-28 DIAGNOSIS — F39 Unspecified mood [affective] disorder: Secondary | ICD-10-CM

## 2013-10-28 DIAGNOSIS — E785 Hyperlipidemia, unspecified: Secondary | ICD-10-CM

## 2013-10-28 DIAGNOSIS — R7309 Other abnormal glucose: Secondary | ICD-10-CM

## 2013-10-28 DIAGNOSIS — R739 Hyperglycemia, unspecified: Secondary | ICD-10-CM

## 2013-10-28 LAB — POCT CBC
GRANULOCYTE PERCENT: 56.6 % (ref 37–80)
HCT, POC: 39.8 % (ref 37.7–47.9)
HEMOGLOBIN: 12.7 g/dL (ref 12.2–16.2)
Lymph, poc: 2.8 (ref 0.6–3.4)
MCH, POC: 30.4 pg (ref 27–31.2)
MCHC: 31.9 g/dL (ref 31.8–35.4)
MCV: 95.3 fL (ref 80–97)
MID (cbc): 0.7 (ref 0–0.9)
MPV: 9.4 fL (ref 0–99.8)
POC GRANULOCYTE: 4.5 (ref 2–6.9)
POC LYMPH PERCENT: 35.1 %L (ref 10–50)
POC MID %: 8.3 %M (ref 0–12)
Platelet Count, POC: 333 10*3/uL (ref 142–424)
RBC: 4.18 M/uL (ref 4.04–5.48)
RDW, POC: 13.6 %
WBC: 8 10*3/uL (ref 4.6–10.2)

## 2013-10-28 LAB — POCT GLYCOSYLATED HEMOGLOBIN (HGB A1C): Hemoglobin A1C: 6.9

## 2013-10-28 LAB — GLUCOSE, POCT (MANUAL RESULT ENTRY): POC GLUCOSE: 69 mg/dL — AB (ref 70–99)

## 2013-10-28 MED ORDER — PRAVASTATIN SODIUM 40 MG PO TABS: 40.0000 mg | ORAL_TABLET | Freq: Every day | ORAL | Status: DC

## 2013-10-28 MED ORDER — FLUOXETINE HCL 20 MG PO CAPS: 40.0000 mg | ORAL_CAPSULE | Freq: Every day | ORAL | Status: DC

## 2013-10-28 NOTE — Patient Instructions (Addendum)
Cholesterol Cholesterol is a white, waxy, fat-like protein needed by your body in small amounts. The liver makes all the cholesterol you need. It is carried from the liver by the blood through the blood vessels. Deposits (plaque) may build up on blood vessel walls. This makes the arteries narrower and stiffer. Plaque increases the risk for heart attack and stroke. You cannot feel your cholesterol level even if it is very high. The only way to know is by a blood test to check your lipid (fats) levels. Once you know your cholesterol levels, you should keep a record of the test results. Work with your caregiver to to keep your levels in the desired range. WHAT THE RESULTS MEAN:  Total cholesterol is a rough measure of all the cholesterol in your blood.  LDL is the so-called bad cholesterol. This is the type that deposits cholesterol in the walls of the arteries. You want this level to be low.  HDL is the good cholesterol because it cleans the arteries and carries the LDL away. You want this level to be high.  Triglycerides are fat that the body can either burn for energy or store. High levels are closely linked to heart disease. DESIRED LEVELS:  Total cholesterol below 200.  LDL below 100 for people at risk, below 70 for very high risk.  HDL above 50 is good, above 60 is best.  Triglycerides below 150. HOW TO LOWER YOUR CHOLESTEROL:  Diet.  Choose fish or white meat chicken and [place], roasted or baked. Limit fatty cuts of red meat, fried foods, and processed meats, such as sausage and lunch meat.  Eat lots of fresh fruits and vegetables. Choose whole grains, beans, pasta, potatoes and cereals.  Use only small amounts of olive, corn or canola oils. Avoid butter, mayonnaise, shortening or palm kernel oils. Avoid foods with trans-fats.  Use skim/nonfat milk and low-fat/nonfat yogurt and cheeses. Avoid whole milk, cream, ice cream, egg yolks and cheeses. Healthy desserts include angel food  cake, ginger snaps, animal crackers, hard candy, popsicles, and low-fat/nonfat frozen yogurt. Avoid pastries, cakes, pies and cookies.  Exercise.  A regular program helps decrease LDL and raises HDL.  Helps with weight control.  Do things that increase your activity level like gardening, walking, or taking the stairs.  Medication.  May be prescribed by your caregiver to help lowering cholesterol and the risk for heart disease.  You may need medicine even if your levels are normal if you have several risk factors. HOME CARE INSTRUCTIONS   Follow your diet and exercise programs as suggested by your caregiver.  Take medications as directed.  Have blood work done when your caregiver feels it is necessary. MAKE SURE YOU:   Understand these instructions.  Will watch your condition.  Will get help right away if you are not doing well or get worse. Document Released: 02/14/2001 Document Revised: 08/14/2011 Document Reviewed: 03/05/2013 [facility] Patient Information 2014 [place], [place]. Mood Disorders Mood disorders are conditions that affect the way a person feels emotionally. The main mood disorders include:  Depression.  Bipolar disorder.  Dysthymia. Dysthymia is a mild, lasting (chronic) depression. Symptoms of dysthymia are similar to depression, but not as severe.  Cyclothymia. Cyclothymia includes mood swings, but the highs and lows are not as severe as they are in bipolar disorder. Symptoms of cyclothymia are similar to those of bipolar disorder, but less extreme. CAUSES  Mood disorders are probably caused by a combination of factors. People with mood disorders seem to  have physical and chemical changes in their brains. Mood disorders run in families, so there may be genetic causes. Severe trauma or stressful life events may also increase the risk of mood disorders.  SYMPTOMS  Symptoms of mood disorders depend on the specific type of condition. Depression symptoms  include:  Feeling sad, worthless, or hopeless.  Negative thoughts.  Inability to enjoy one's usual activities.  Low energy.  Sleeping too much or too little.  Appetite changes.  Crying.  Concentration problems.  Thoughts of harming oneself. Bipolar disorder symptoms include:  Periods of depression (see above symptoms).  Mood swings, from sadness and depression, to abnormal elation and excitement.  Periods of mania:  Racing thoughts.  Fast speech.  Poor judgment, and careless, dangerous choices.  Decreased need for sleep.  Risky behavior.  Difficulty concentrating.  Irritability.  Increased energy.  Increased sex drive. DIAGNOSIS  There are no blood tests or X-rays that can confirm a mood disorder. However, your caregiver may choose to run some tests to make sure that there is not another physical cause for your symptoms. A mood disorder is usually diagnosed after an in-depth interview with a caregiver. TREATMENT  Mood disorders can be treated with one or more of the following:  Medicine. This may include antidepressants, mood-stabilizers, or anti-psychotics.  Psychotherapy (talk therapy).  Cognitive behavioral therapy. You are taught to recognize negative thoughts and behavior patterns, and replace them with healthy thoughts and behaviors.  Electroconvulsive therapy. For very severe cases of deep depression, a series of treatments in which an electrical current is applied to the brain.  Vagus nerve stimulation. A pulse of electricity is applied to a portion of the brain.  Transcranial magnetic stimulation. Powerful magnets are placed on the head that produce electrical currents.  Hospitalization. In severe situations, or when someone is having serious thoughts of harming him or herself, hospitalization may be necessary in order to keep the person safe. This is also done to quickly start and monitor treatment. HOME CARE INSTRUCTIONS   Take your medicine  exactly as directed.  Attend all of your therapy sessions.  Try to eat regular, healthy meals.  Exercise daily. Exercise may improve mood symptoms.  Get good sleep.  Do not drink alcohol or use pot or other drugs. These can worsen mood symptoms and cause anxiety and psychosis.  Tell your caregiver if you develop any side effects, such as feeling sick to your stomach (nauseous), dry mouth, dizziness, constipation, drowsiness, tremor, weight gain, or sexual symptoms. He or she may suggest things you can do to improve symptoms.  Learn ways to cope with the stress of having a chronic illness. This includes yoga, meditation, tai chi, or participating in a support group.  Drink enough water to keep your urine clear or pale yellow. Eat a high-fiber diet. These habits may help you avoid constipation from your medicine. SEEK IMMEDIATE MEDICAL CARE IF:  Your mood worsens.  You have thoughts of hurting yourself or others.  You cannot care for yourself.  You develop the sensation of hearing or seeing something that is not actually present (auditory or visual hallucinations).  You develop abnormal thoughts. Document Released: 03/19/2009 Document Revised: 08/14/2011 Document Reviewed: 03/19/2009 [facility] Patient Information 2014 [place], [place].

## 2013-10-28 NOTE — Progress Notes (Signed)
   Subjective:    Patient ID: Kelli [name], female    DOB: [birth year], 68 y.o.   MRN: [id number]  HPI 68 yr old female is here today for medication refill on Prozac 20 mg  and Pravastatin 40 mg. She states she is doing fine and has no complaints. Agrees to cpe soon 104  Review of Systems Hx of colon cancer doing well    Objective:   Physical Exam  Constitutional: She is oriented to person, place, and time. She appears well-developed and well-nourished.  HENT:  Head: Normocephalic.  Eyes: EOM are normal.  Neck: Normal range of motion.  Cardiovascular: Normal rate, regular rhythm and normal heart sounds.  Exam reveals no gallop.   No murmur heard. Pulmonary/Chest: Effort normal and breath sounds normal.  Abdominal: Soft. There is no tenderness.  Neurological: She is alert and oriented to person, place, and time. She exhibits normal muscle tone. Coordination normal.  Psychiatric: She has a normal mood and affect. Her behavior is normal. Judgment and thought content normal.     Results for orders placed in visit on 10/28/13  GLUCOSE, POCT (MANUAL RESULT ENTRY)      Result Value Ref Range   POC Glucose 69 (*) 70 - 99 mg/dl        Assessment & Plan:  RF Pravastatin/Fluoxetine Schedule cpe soon

## 2013-10-28 NOTE — Progress Notes (Signed)
   Subjective:    Patient ID: Kelli [name], female    DOB: 09/26/1945, 68 y.o.   MRN: [id number]  HPI    Review of Systems     Objective:   Physical Exam        Assessment & Plan:

## 2013-10-29 LAB — COMPREHENSIVE METABOLIC PANEL
ALBUMIN: 4.6 g/dL (ref 3.5–5.2)
ALT: 21 U/L (ref 0–35)
AST: 21 U/L (ref 0–37)
Alkaline Phosphatase: 77 U/L (ref 39–117)
BUN: 19 mg/dL (ref 6–23)
CALCIUM: 9.7 mg/dL (ref 8.4–10.5)
CHLORIDE: 103 meq/L (ref 96–112)
CO2: 25 mEq/L (ref 19–32)
Creat: 0.78 mg/dL (ref 0.50–1.10)
GLUCOSE: 96 mg/dL (ref 70–99)
POTASSIUM: 3.8 meq/L (ref 3.5–5.3)
SODIUM: 138 meq/L (ref 135–145)
TOTAL PROTEIN: 7 g/dL (ref 6.0–8.3)
Total Bilirubin: 0.4 mg/dL (ref 0.2–1.2)

## 2013-10-29 LAB — LIPID PANEL
CHOLESTEROL: 280 mg/dL — AB (ref 0–200)
HDL: 43 mg/dL (ref >39)
LDL Cholesterol: 158 mg/dL — ABNORMAL HIGH (ref 0–99)
TRIGLYCERIDES: 394 mg/dL — AB (ref <150)
Total CHOL/HDL Ratio: 6.5 Ratio
VLDL: 79 mg/dL — ABNORMAL HIGH (ref 0–40)

## 2013-10-31 ENCOUNTER — Encounter: Payer: Self-pay | Admitting: Radiology

## 2013-11-02 ENCOUNTER — Encounter: Payer: Self-pay | Admitting: Gastroenterology

## 2013-11-14 ENCOUNTER — Telehealth: Payer: Self-pay | Admitting: Oncology

## 2013-11-14 NOTE — Telephone Encounter (Signed)
gave pt appt for lab and MD r/s from 10/26 due to MD's CME

## 2014-02-16 ENCOUNTER — Ambulatory Visit (INDEPENDENT_AMBULATORY_CARE_PROVIDER_SITE_OTHER): Payer: Medicare Other

## 2014-02-16 ENCOUNTER — Ambulatory Visit: Payer: Self-pay

## 2014-02-16 ENCOUNTER — Ambulatory Visit (INDEPENDENT_AMBULATORY_CARE_PROVIDER_SITE_OTHER): Payer: Medicare Other | Admitting: Internal Medicine

## 2014-02-16 VITALS — BP 112/64 | HR 79 | Temp 97.8°F | Resp 20 | Wt 173.4 lb

## 2014-02-16 DIAGNOSIS — R05 Cough: Secondary | ICD-10-CM

## 2014-02-16 DIAGNOSIS — R509 Fever, unspecified: Secondary | ICD-10-CM

## 2014-02-16 DIAGNOSIS — R0781 Pleurodynia: Secondary | ICD-10-CM

## 2014-02-16 DIAGNOSIS — R079 Chest pain, unspecified: Secondary | ICD-10-CM

## 2014-02-16 DIAGNOSIS — R059 Cough, unspecified: Secondary | ICD-10-CM

## 2014-02-16 LAB — POCT CBC
GRANULOCYTE PERCENT: 61 % (ref 37–80)
HCT, POC: 38.2 % (ref 37.7–47.9)
Hemoglobin: 13 g/dL (ref 12.2–16.2)
Lymph, poc: 2.2 (ref 0.6–3.4)
MCH, POC: 31.1 pg (ref 27–31.2)
MCHC: 33.9 g/dL (ref 31.8–35.4)
MCV: 91.7 fL (ref 80–97)
MID (CBC): 0.5 (ref 0–0.9)
MPV: 8.1 fL (ref 0–99.8)
PLATELET COUNT, POC: 262 10*3/uL (ref 142–424)
POC Granulocyte: 4.1 (ref 2–6.9)
POC LYMPH %: 31.8 % (ref 10–50)
POC MID %: 7.2 % (ref 0–12)
RBC: 4.17 M/uL (ref 4.04–5.48)
RDW, POC: 13.5 %
WBC: 6.8 10*3/uL (ref 4.6–10.2)

## 2014-02-16 MED ORDER — HYDROCODONE-ACETAMINOPHEN 7.5-325 MG/15ML PO SOLN: 5.0000 mL | Freq: Four times a day (QID) | ORAL | Status: DC | PRN

## 2014-02-16 MED ORDER — AZITHROMYCIN 500 MG PO TABS: 500.0000 mg | ORAL_TABLET | Freq: Every day | ORAL | Status: DC

## 2014-02-16 NOTE — Patient Instructions (Addendum)
Chest Pain (Nonspecific) °It is often hard to give a specific diagnosis for the cause of chest pain. There is always a chance that your pain could be related to something serious, such as a heart attack or a blood clot in the lungs. You need to follow up with your health care provider for further evaluation. °CAUSES  °· Heartburn. °· Pneumonia or bronchitis. °· Anxiety or stress. °· Inflammation around your heart (pericarditis) or lung (pleuritis or pleurisy). °· A blood clot in the lung. °· A collapsed lung (pneumothorax). It can develop suddenly on its own (spontaneous pneumothorax) or from trauma to the chest. °· Shingles infection (herpes zoster virus). °The chest wall is composed of bones, muscles, and cartilage. Any of these can be the source of the pain. °· The bones can be bruised by injury. °· The muscles or cartilage can be strained by coughing or overwork. °· The cartilage can be affected by inflammation and become sore (costochondritis). °DIAGNOSIS  °Lab tests or other studies may be needed to find the cause of your pain. Your health care provider may have you take a test called an ambulatory electrocardiogram (ECG). An ECG records your heartbeat patterns over a 24-hour period. You may also have other tests, such as: °· Transthoracic echocardiogram (TTE). During echocardiography, sound waves are used to evaluate how blood flows through your heart. °· Transesophageal echocardiogram (TEE). °· Cardiac monitoring. This allows your health care provider to monitor your heart rate and rhythm in real time. °· Holter monitor. This is a portable device that records your heartbeat and can help diagnose heart arrhythmias. It allows your health care provider to track your heart activity for several days, if needed. °· Stress tests by exercise or by giving medicine that makes the heart beat faster. °TREATMENT  °· Treatment depends on what may be causing your chest pain. Treatment may include: °· Acid blockers for  heartburn. °· Anti-inflammatory medicine. °· Pain medicine for inflammatory conditions. °· Antibiotics if an infection is present. °· You may be advised to change lifestyle habits. This includes stopping smoking and avoiding alcohol, caffeine, and chocolate. °· You may be advised to keep your head raised (elevated) when sleeping. This reduces the chance of acid going backward from your stomach into your esophagus. °Most of the time, nonspecific chest pain will improve within 2-3 days with rest and mild pain medicine.  °HOME CARE INSTRUCTIONS  °· If antibiotics were prescribed, take them as directed. Finish them even if you start to feel better. °· For the next few days, avoid physical activities that bring on chest pain. Continue physical activities as directed. °· Do not use any tobacco products, including cigarettes, chewing tobacco, or electronic cigarettes. °· Avoid drinking alcohol. °· Only take medicine as directed by your health care provider. °· Follow your health care provider's suggestions for further testing if your chest pain does not go away. °· Keep any follow-up appointments you made. If you do not go to an appointment, you could develop lasting (chronic) problems with pain. If there is any problem keeping an appointment, call to reschedule. °SEEK MEDICAL CARE IF:  °· Your chest pain does not go away, even after treatment. °· You have a rash with blisters on your chest. °· You have a fever. °SEEK IMMEDIATE MEDICAL CARE IF:  °· You have increased chest pain or pain that spreads to your arm, neck, jaw, back, or abdomen. °· You have shortness of breath. °· You have an increasing cough, or you cough   up blood. °· You have severe back or abdominal pain. °· You feel nauseous or vomit. °· You have severe weakness. °· You faint. °· You have chills. °This is an emergency. Do not wait to see if the pain will go away. Get medical help at once. Call your local emergency services (911 in [place] Do not drive  yourself to the hospital. °MAKE SURE YOU:  °· Understand these instructions. °· Will watch your condition. °· Will get help right away if you are not doing well or get worse. °Document Released: 03/01/2005 Document Revised: 05/27/2013 Document Reviewed: 12/26/2007 °ExitCare® Patient Information ©2015 [employer]. This information is not intended to replace advice given to you by your health care provider. Make sure you discuss any questions you have with your health care provider. °Pleurisy °Pleurisy is an inflammation and swelling of the lining of the lungs (pleura). Because of this inflammation, it hurts to breathe. It can be aggravated by coughing, laughing, or deep breathing. Pleurisy is often caused by an underlying infection or disease.  °HOME CARE INSTRUCTIONS  °Monitor your pleurisy for any changes. The following actions may help to alleviate any discomfort you are experiencing: °· Medicine may help with pain. Only take over-the-counter or prescription medicines for pain, discomfort, or fever as directed by your health care provider. °· Only take antibiotic medicine as directed. Make sure to finish it even if you start to feel better. °SEEK MEDICAL CARE IF:  °· Your pain is not controlled with medicine or is increasing. °· You have an increase in pus-like (purulent) secretions brought up with coughing. °SEEK IMMEDIATE MEDICAL CARE IF:  °· You have blue or dark lips, fingernails, or toenails. °· You are coughing up blood. °· You have increased difficulty breathing. °· You have continuing pain unrelieved by medicine or pain lasting more than 1 week. °· You have pain that radiates into your neck, arms, or jaw. °· You develop increased shortness of breath or wheezing. °· You develop a fever, rash, vomiting, fainting, or other serious symptoms. °MAKE SURE YOU: °· Understand these instructions.   °· Will watch your condition.   °· Will get help right away if you are not doing well or get worse. °  °Document  Released: 05/22/2005 Document Revised: 01/22/2013 Document Reviewed: 11/03/2012 °ExitCare® Patient Information ©2015 [employer]. This information is not intended to replace advice given to you by your health care provider. Make sure you discuss any questions you have with your health care provider. ° °

## 2014-02-16 NOTE — Progress Notes (Signed)
   Subjective:    Patient ID: Kelli [name], female    DOB: 03/28/1946, 68 y.o.   MRN: [id number]  HPI 68 y/o female with complaints of right sided pain in chest wall when using right arm  or breathing deep for 2 x days No shortness breath Cough x 2days Coughing up brown phlem for 1 day Low grade fever took asprin No diarrhea No vomiting  Former smoker quite 5 years Hx of colon cancer  No sob, dyspnea      Review of Systems     Objective:   Physical Exam  Constitutional: She is oriented to person, place, and time. She appears well-developed and well-nourished.  HENT:  Head: Normocephalic and atraumatic.  Nose: Nose normal.  Eyes: EOM are normal. Pupils are equal, round, and reactive to light.  Neck: Normal range of motion. Neck supple.  Cardiovascular: Normal rate, regular rhythm and normal heart sounds.   Pulmonary/Chest: Effort normal. Not tachypneic. No respiratory distress. She has decreased breath sounds. She has wheezes. She has rhonchi. She has no rales. She exhibits tenderness.  Abdominal: Soft. Bowel sounds are normal. There is no tenderness. There is no guarding.  Musculoskeletal: She exhibits tenderness.  Neurological: She is alert and oriented to person, place, and time. No cranial nerve deficit. She exhibits normal muscle tone. Coordination normal.  Skin: No rash noted.  Psychiatric: She has a normal mood and affect. Her behavior is normal. Judgment and thought content normal.    [facility] reading (PRIMARY) by  Dr.Jaiveer Panas no fx of ribs seen, cxr appears normal Results for orders placed in visit on 02/16/14  POCT CBC      Result Value Ref Range   WBC 6.8  4.6 - 10.2 K/uL   Lymph, poc 2.2  0.6 - 3.4   POC LYMPH PERCENT 31.8  10 - 50 %L   MID (cbc) 0.5  0 - 0.9   POC MID % 7.2  0 - 12 %M   POC Granulocyte 4.1  2 - 6.9   Granulocyte percent 61.0  37 - 80 %G   RBC 4.17  4.04 - 5.48 M/uL   Hemoglobin 13.0  12.2 - 16.2 g/dL   HCT, POC 38.2  37.7 - 47.9 %   MCV 91.7   80 - 97 fL   MCH, POC 31.1  27 - 31.2 pg   MCHC 33.9  31.8 - 35.4 g/dL   RDW, POC 13.5     Platelet Count, POC 262  142 - 424 K/uL   MPV 8.1  0 - 99.8 fL          Assessment & Plan:  Pleuritic chest pain/Cause unclear Do CT chest if persists

## 2014-03-30 ENCOUNTER — Ambulatory Visit: Payer: Self-pay | Admitting: Oncology

## 2014-03-30 ENCOUNTER — Other Ambulatory Visit: Payer: Self-pay

## 2014-04-01 ENCOUNTER — Telehealth: Payer: Self-pay | Admitting: Oncology

## 2014-04-01 NOTE — Telephone Encounter (Signed)
Pt called to r/s due to family issues, pt confirmed labs/ov ...Marland KitchenMarland KitchenMarland Kitchen KJ

## 2014-04-03 ENCOUNTER — Other Ambulatory Visit: Payer: Self-pay

## 2014-04-03 ENCOUNTER — Ambulatory Visit: Payer: Self-pay | Admitting: Oncology

## 2014-05-01 ENCOUNTER — Other Ambulatory Visit: Payer: Self-pay

## 2014-05-01 ENCOUNTER — Ambulatory Visit: Payer: Self-pay | Admitting: Oncology

## 2014-05-15 ENCOUNTER — Telehealth: Payer: Self-pay

## 2014-05-15 NOTE — Telephone Encounter (Signed)
Spoke to patient she doesn't take the flu shot.

## 2014-05-19 ENCOUNTER — Telehealth: Payer: Self-pay | Admitting: Oncology

## 2014-05-19 ENCOUNTER — Other Ambulatory Visit: Payer: Self-pay | Admitting: *Deleted

## 2014-05-19 DIAGNOSIS — Z85038 Personal history of other malignant neoplasm of large intestine: Secondary | ICD-10-CM

## 2014-05-19 NOTE — Progress Notes (Signed)
Was no show for her 1 year follow up visit with lab. POF to scheduler.

## 2014-05-19 NOTE — Telephone Encounter (Signed)
s.w. pt and advised on Jan 2016 appt....ok and aware

## 2014-06-02 ENCOUNTER — Telehealth: Payer: Self-pay | Admitting: Oncology

## 2014-06-02 NOTE — Telephone Encounter (Signed)
moved appt for 1/12 to 1/25 due to call day. lmonvm for pt and schedule mailed.

## 2014-06-16 ENCOUNTER — Other Ambulatory Visit: Payer: Medicare Other

## 2014-06-16 ENCOUNTER — Ambulatory Visit: Payer: Medicare Other | Admitting: Oncology

## 2014-06-17 ENCOUNTER — Encounter: Payer: Self-pay | Admitting: Family Medicine

## 2014-06-17 ENCOUNTER — Ambulatory Visit (INDEPENDENT_AMBULATORY_CARE_PROVIDER_SITE_OTHER): Payer: Commercial Managed Care - HMO | Admitting: Family Medicine

## 2014-06-17 VITALS — BP 120/70 | HR 68 | Temp 97.6°F | Resp 16 | Ht 69.0 in | Wt 174.0 lb

## 2014-06-17 DIAGNOSIS — E785 Hyperlipidemia, unspecified: Secondary | ICD-10-CM

## 2014-06-17 DIAGNOSIS — Z7189 Other specified counseling: Secondary | ICD-10-CM

## 2014-06-17 DIAGNOSIS — F39 Unspecified mood [affective] disorder: Secondary | ICD-10-CM

## 2014-06-17 MED ORDER — FLUOXETINE HCL 20 MG PO CAPS: 40.0000 mg | ORAL_CAPSULE | Freq: Every day | ORAL | Status: DC

## 2014-06-17 NOTE — Progress Notes (Signed)
S:  This 69 y.o. Female is here to get established w/ this provider at 104. She has chronic depression and anxiety and hyperlipidemia, stable on current medications. She reports medication compliance w/o adverse effects. Pt is colon cancer survivor, s/p colectomy and has surveillance at [facility]. Provider at that facility has changed and pt is scheduled to him in a few months. She will contact Dr. Owens Loffler to schedule colonoscopy.  Patient Active Problem List   Diagnosis Date Noted  . Hematochezia 04/01/2013  . H/O colon cancer, stage II 09/19/2011  . Lung nodules 09/19/2011  . Depression with anxiety 09/19/2011  . Hyperlipidemia 09/19/2011    Prior to Admission medications   Medication Sig Start Date End Date Taking? Authorizing Provider  aspirin 81 MG tablet Take 81 mg by mouth daily.   Yes Historical Provider, MD  b complex vitamins tablet Take 1 tablet by mouth daily.   Yes Historical Provider, MD  cholecalciferol (VITAMIN D) 1000 UNITS tablet Take 2,000 Units by mouth daily.   Yes Historical Provider, MD  FLUoxetine (PROZAC) 20 MG capsule Take 2 capsules (40 mg total) by mouth daily.   Yes Orma Flaming, MD  pravastatin (PRAVACHOL) 40 MG tablet Take 1 tablet (40 mg total) by mouth daily. 10/28/13  Yes Orma Flaming, MD    PHMx, SURG Hx, SOC and FAM Hx reviewed.  ROS: Negative for abnormal weight loss, anorexia, fever/chills, vision disturbances, upper resp symptoms, CP or tightness, palpitations, SOB or DOE, cough, edema, GI problems, myalgias, back pain, joint swelling or effusions, jaundice or other skin changes, HA, dizziness, numbness, weakness or syncope.  O: Filed Vitals:   06/17/14 1101  BP: 120/70  Pulse: 68  Temp: 97.6 F (36.4 C)  Resp: 16   GEN: In NAD: WN,WD. HENT: [place]/AT; EOMI w/ clear conj/sclerae. Otherwise unremarkable. COR: RRR. LUNGS: Unlabored resp. SKIN; W&D; intact w/o erythema, diaphoresis, rash or pallor. NEURO: A&O x 3; CNs  intact. Nonfocal.  A/P: Mood disorder - Plan: FLUoxetine (PROZAC) 20 MG capsule  Hyperlipidemia - Continue Pravastatin 40 mg 1 tablet daily.  Encounter for medication review and counseling - Plan: FLUoxetine (PROZAC) 20 MG capsule   Schedule MCR Annual CPE; Prevnar13 due at next visit; will also recommend Zostavax and Tetanus booster.

## 2014-06-25 ENCOUNTER — Encounter: Payer: Self-pay | Admitting: Gastroenterology

## 2014-06-29 ENCOUNTER — Other Ambulatory Visit: Payer: Medicare Other

## 2014-06-29 ENCOUNTER — Ambulatory Visit: Payer: Medicare Other | Admitting: Oncology

## 2014-06-29 ENCOUNTER — Telehealth: Payer: Self-pay | Admitting: Oncology

## 2014-06-29 NOTE — Telephone Encounter (Signed)
Pt called and confirmed labs/ov r/s from 01/25 to 02/23.[name]... KJ

## 2014-07-07 ENCOUNTER — Ambulatory Visit (AMBULATORY_SURGERY_CENTER): Payer: Self-pay

## 2014-07-07 VITALS — Ht 69.0 in | Wt 172.0 lb

## 2014-07-07 DIAGNOSIS — C189 Malignant neoplasm of colon, unspecified: Secondary | ICD-10-CM

## 2014-07-07 MED ORDER — MOVIPREP 100 G PO SOLR: 1.0000 | Freq: Once | ORAL | Status: DC

## 2014-07-07 NOTE — Progress Notes (Signed)
No allergies to eggs or soy No home oxygen No diet/weight loss meds No past problems with anesthesia except crying spell  Has email  Emmi instructions given for colonoscopy

## 2014-07-21 ENCOUNTER — Encounter: Payer: Self-pay | Admitting: Gastroenterology

## 2014-07-21 ENCOUNTER — Ambulatory Visit (AMBULATORY_SURGERY_CENTER): Payer: Commercial Managed Care - HMO | Admitting: Gastroenterology

## 2014-07-21 VITALS — BP 115/76 | HR 71 | Temp 97.5°F | Resp 16 | Ht 69.0 in | Wt 172.0 lb

## 2014-07-21 DIAGNOSIS — D124 Benign neoplasm of descending colon: Secondary | ICD-10-CM

## 2014-07-21 DIAGNOSIS — Z8509 Personal history of malignant neoplasm of other digestive organs: Secondary | ICD-10-CM

## 2014-07-21 DIAGNOSIS — D125 Benign neoplasm of sigmoid colon: Secondary | ICD-10-CM

## 2014-07-21 DIAGNOSIS — C189 Malignant neoplasm of colon, unspecified: Secondary | ICD-10-CM

## 2014-07-21 MED ORDER — SODIUM CHLORIDE 0.9 % IV SOLN: 500.0000 mL | INTRAVENOUS | Status: DC

## 2014-07-21 NOTE — Op Note (Signed)
[place]  [address] [place], [id number]   COLONOSCOPY PROCEDURE REPORT  PATIENT: Kelli [name], Kelli [name]  MR#: [id number] BIRTHDATE: May 11, 1946 , 66  yrs. old GENDER: female ENDOSCOPIST: Milus Banister, MD PROCEDURE DATE:  07/21/2014 PROCEDURE:   Colonoscopy with snare polypectomy First Screening Colonoscopy - Avg.  risk and is 50 yrs.  old or older - No.  Prior Negative Screening - Now for repeat screening. N/A  History of Adenoma - Now for follow-up colonoscopy & has been > or = to 3 yrs.  Yes hx of adenoma.  Has been 3 or more years since last colonoscopy.  History of Adenoma - Now for follow-up colonoscopy & has been > or = to 3 yrs.  Polyps Removed Today? Yes. ASA CLASS:   Class II INDICATIONS:T3N0M0 cecal cancer resected 2010; +adjuvant chemo; 02/2010 colonoscopy Ardis Hughs several small polyps (all were HPs except one TA), anastomosis normal. MEDICATIONS: Monitored anesthesia care and Propofol 250 mg IV  DESCRIPTION OF PROCEDURE:   After the risks benefits and alternatives of the procedure were thoroughly explained, informed consent was obtained.  The digital rectal exam revealed no abnormalities of the rectum.   The [id number]  endoscope was introduced through the anus and advanced to the cecum, which was identified by both the appendix and ileocecal valve. No adverse events experienced.   The quality of the prep was excellent.  The instrument was then slowly withdrawn as the colon was fully examined.   COLON FINDINGS: Two small polyps were found, removed and sent to pathology.  These were both sessile 3-83mm across, located in descending and sigmoid segments, removed with cold snare.  The ileocolonic anastomosis was normal appearing.  There were internal and external hemorrhoids.  The examination was otherwise normal. Retroflexed views revealed no abnormalities. The time to cecum= NA. Withdrawal time=NA.  The scope was withdrawn and the  procedure completed. COMPLICATIONS: There were no immediate complications.  ENDOSCOPIC IMPRESSION: Two small polyps were found, removed and sent to pathology. The ileocolonic anastomosis was normal appearing.  There were internal and external hemorrhoids.  The examination was otherwise normal  RECOMMENDATIONS: If the polyp(s) removed today are proven to be adenomatous (pre-cancerous) polyps, you will need a repeat colonoscopy in 5 years.  You will receive a letter within 1-2 weeks with the results of your biopsy as well as final recommendations.  Please call my office if you have not received a letter after 3 weeks.  eSigned:  Milus Banister, MD 07/21/2014 1:56 PM

## 2014-07-21 NOTE — Progress Notes (Signed)
Called to room to assist during endoscopic procedure.  Patient ID and intended procedure confirmed with present staff. Received instructions for my participation in the procedure from the performing physician.  

## 2014-07-21 NOTE — Patient Instructions (Signed)
YOU HAD AN ENDOSCOPIC PROCEDURE TODAY AT THE [facility]: Refer to the procedure report that was given to you for any specific questions about what was found during the examination.  If the procedure report does not answer your questions, please call your gastroenterologist to clarify.  If you requested that your care partner not be given the details of your procedure findings, then the procedure report has been included in a sealed envelope for you to review at your convenience later.  YOU SHOULD EXPECT: Some feelings of bloating in the abdomen. Passage of more gas than usual.  Walking can help get rid of the air that was put into your GI tract during the procedure and reduce the bloating. If you had a lower endoscopy (such as a colonoscopy or flexible sigmoidoscopy) you may notice spotting of blood in your stool or on the toilet paper. If you underwent a bowel prep for your procedure, then you may not have a normal bowel movement for a few days.  DIET: Your first meal following the procedure should be a light meal and then it is ok to progress to your normal diet.  A half-sandwich or bowl of soup is an example of a good first meal.  Heavy or fried foods are harder to digest and may make you feel nauseous or bloated.  Likewise meals heavy in dairy and vegetables can cause extra gas to form and this can also increase the bloating.  Drink plenty of fluids but you should avoid alcoholic beverages for 24 hours.  ACTIVITY: Your care partner should take you home directly after the procedure.  You should plan to take it easy, moving slowly for the rest of the day.  You can resume normal activity the day after the procedure however you should NOT DRIVE or use heavy machinery for 24 hours (because of the sedation medicines used during the test).    SYMPTOMS TO REPORT IMMEDIATELY: A gastroenterologist can be reached at any hour.  During normal business hours, 8:30 AM to 5:00 PM Monday through Friday,  call [phone].  After hours and on weekends, please call the GI answering service at [phone] who will take a message and have the physician on call contact you.   Following lower endoscopy (colonoscopy or flexible sigmoidoscopy):  Excessive amounts of blood in the stool  Significant tenderness or worsening of abdominal pains  Swelling of the abdomen that is new, acute  Fever of 100F or higher  FOLLOW UP: If any biopsies were taken you will be contacted by phone or by letter within the next 1-3 weeks.  Call your gastroenterologist if you have not heard about the biopsies in 3 weeks.  Our staff will call the home number listed on your records the next business day following your procedure to check on you and address any questions or concerns that you may have at that time regarding the information given to you following your procedure. This is a courtesy call and so if there is no answer at the home number and we have not heard from you through the emergency physician on call, we will assume that you have returned to your regular daily activities without incident.  SIGNATURES/CONFIDENTIALITY: You and/or your care partner have signed paperwork which will be entered into your electronic medical record.  These signatures attest to the fact that that the information above on your After Visit Summary has been reviewed and is understood.  Full responsibility of the confidentiality of this   discharge information lies with you and/or your care-partner.  Recommendations Discharge instructions given to patient and/or care partner. Polyp and hemorrhoid handouts provided.

## 2014-07-21 NOTE — Progress Notes (Signed)
Patient awakening,vss,report to rn 

## 2014-07-22 ENCOUNTER — Telehealth: Payer: Self-pay | Admitting: *Deleted

## 2014-07-22 NOTE — Telephone Encounter (Signed)
  Follow up Call-  Call back number 07/21/2014  Post procedure Call Back phone  # [phone]  Permission to leave phone message Yes     Patient questions:  Do you have a fever, pain , or abdominal swelling? No. Pain Score  0 *  Have you tolerated food without any problems? Yes.    Have you been able to return to your normal activities? Yes.    Do you have any questions about your discharge instructions: Diet   No. Medications  No. Follow up visit  No.  Do you have questions or concerns about your Care? No.  Actions: * If pain score is 4 or above: No action needed, pain <4.

## 2014-07-28 ENCOUNTER — Other Ambulatory Visit: Payer: Medicare Other

## 2014-07-28 ENCOUNTER — Ambulatory Visit: Payer: Medicare Other | Admitting: Oncology

## 2014-07-30 ENCOUNTER — Encounter: Payer: Self-pay | Admitting: Gastroenterology

## 2014-08-13 NOTE — Addendum Note (Signed)
Addended by: Steva Ready on: 08/13/2014 01:52 PM   Modules accepted: Level of Service

## 2014-08-14 ENCOUNTER — Other Ambulatory Visit: Payer: Self-pay | Admitting: *Deleted

## 2014-08-17 ENCOUNTER — Other Ambulatory Visit: Payer: Medicare Other

## 2014-08-17 ENCOUNTER — Ambulatory Visit: Payer: Medicare Other | Admitting: Oncology

## 2014-08-28 ENCOUNTER — Other Ambulatory Visit (HOSPITAL_BASED_OUTPATIENT_CLINIC_OR_DEPARTMENT_OTHER): Payer: Commercial Managed Care - HMO

## 2014-08-28 ENCOUNTER — Ambulatory Visit (HOSPITAL_BASED_OUTPATIENT_CLINIC_OR_DEPARTMENT_OTHER): Payer: Commercial Managed Care - HMO | Admitting: Oncology

## 2014-08-28 ENCOUNTER — Encounter: Payer: Self-pay | Admitting: *Deleted

## 2014-08-28 VITALS — BP 114/71 | HR 74 | Temp 97.2°F | Resp 18 | Ht 69.0 in | Wt 178.5 lb

## 2014-08-28 DIAGNOSIS — Z85038 Personal history of other malignant neoplasm of large intestine: Secondary | ICD-10-CM

## 2014-08-28 DIAGNOSIS — R918 Other nonspecific abnormal finding of lung field: Secondary | ICD-10-CM

## 2014-08-28 LAB — COMPREHENSIVE METABOLIC PANEL (CC13)
ALT: 25 U/L (ref 0–55)
AST: 19 U/L (ref 5–34)
Albumin: 3.8 g/dL (ref 3.5–5.0)
Alkaline Phosphatase: 92 U/L (ref 40–150)
Anion Gap: 10 mEq/L (ref 3–11)
BUN: 12.3 mg/dL (ref 7.0–26.0)
CALCIUM: 8.5 mg/dL (ref 8.4–10.4)
CHLORIDE: 107 meq/L (ref 98–109)
CO2: 24 meq/L (ref 22–29)
Creatinine: 0.8 mg/dL (ref 0.6–1.1)
EGFR: 77 mL/min/{1.73_m2} — AB (ref >90)
Glucose: 165 mg/dl — ABNORMAL HIGH (ref 70–140)
POTASSIUM: 4.2 meq/L (ref 3.5–5.1)
Sodium: 140 mEq/L (ref 136–145)
TOTAL PROTEIN: 6.6 g/dL (ref 6.4–8.3)
Total Bilirubin: 0.27 mg/dL (ref 0.20–1.20)

## 2014-08-28 LAB — CBC WITH DIFFERENTIAL/PLATELET
BASO%: 0.7 % (ref 0.0–2.0)
BASOS ABS: 0.1 10*3/uL (ref 0.0–0.1)
EOS ABS: 0.3 10*3/uL (ref 0.0–0.5)
EOS%: 4.1 % (ref 0.0–7.0)
HEMATOCRIT: 36.5 % (ref 34.8–46.6)
HGB: 12.3 g/dL (ref 11.6–15.9)
LYMPH%: 38.6 % (ref 14.0–49.7)
MCH: 31.3 pg (ref 25.1–34.0)
MCHC: 33.7 g/dL (ref 31.5–36.0)
MCV: 92.9 fL (ref 79.5–101.0)
MONO#: 0.4 10*3/uL (ref 0.1–0.9)
MONO%: 6.3 % (ref 0.0–14.0)
NEUT%: 50.3 % (ref 38.4–76.8)
NEUTROS ABS: 3.5 10*3/uL (ref 1.5–6.5)
PLATELETS: 269 10*3/uL (ref 145–400)
RBC: 3.93 10*6/uL (ref 3.70–5.45)
RDW: 12.8 % (ref 11.2–14.5)
WBC: 7 10*3/uL (ref 3.9–10.3)
lymph#: 2.7 10*3/uL (ref 0.9–3.3)

## 2014-08-28 LAB — CEA: CEA: 1.8 ng/mL (ref 0.0–5.0)

## 2014-08-28 NOTE — Progress Notes (Signed)
  [facility] OFFICE PROGRESS NOTE   Diagnosis: Colon cancer  INTERVAL HISTORY:   Kelli [name] has a history of colon cancer diagnosed in 2010. She has been followed by Dr. Beryle Beams. She was last seen at the [facility] in October 2014. She feels well. No specific complaint. She underwent a colonoscopy 07/21/2014. Small polyps were removed from the descending and sigmoid colon. At the pathology revealed a tubular adenoma and benign colonic mucosal fold. No high-grade dysplasia or malignancy.   Objective:  Vital signs in last 24 hours:  Blood pressure 114/71, pulse 74, temperature 97.2 F (36.2 C), temperature source Oral, resp. rate 18, height 5\' 9"  (1.753 m), weight 178 lb 8 oz (80.967 kg), SpO2 99 %.    HEENT: Neck without mass Lymphatics: No cervical, supra-clavicular, axillary, or inguinal nodes Resp: Lungs clear bilaterally Cardio: Regular rate and rhythm GI: No hepatosplenomegaly, nontender, no mass Vascular: No leg edema   Lab Results:  Lab Results  Component Value Date   WBC 7.0 08/28/2014   HGB 12.3 08/28/2014   HCT 36.5 08/28/2014   MCV 92.9 08/28/2014   PLT 269 08/28/2014   NEUTROABS 3.5 08/28/2014      Lab Results  Component Value Date   CEA 1.7 03/31/2013    Medications: I have reviewed the patient's current medications.  Assessment/Plan:  1. 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 History of colonic polyps-status post a surveillance colonoscopy in February 2016 with a tubular adenoma removed 3. History of heavy tobacco use, quit in 2010 4. History of lung nodules-last imaging was an abdomen CT in October 2013,  Disposition:  [name] remains in clinical remission from colon cancer. She has a good prognosis for a long-term disease-free survival. We will follow-up on the CEA from today. She will be discharged from the Oncology clinic. She will continue surveillance  colonoscopies with Dr. Ardis Hughs.  She has a history of heavy tobacco use and quit 6 years ago. She has a history of lung nodules. We will refer her for a screening chest CT.  [name] will continue clinical follow-up with Dr. Leward Quan.  We will be sure the 2010 tumor is tested for hereditary non-polyposis colon cancer syndrome. Betsy Coder, MD  08/28/2014  1:00 PM

## 2014-08-28 NOTE — Progress Notes (Signed)
Dr. Benay Spice requesting IHC and MSI testing on her tumor block from 02/15/2009 right colectomy per Dr. Zella Richer. Unable to locate case # in EPIC. [facility] notified for request.

## 2014-08-31 ENCOUNTER — Telehealth: Payer: Self-pay

## 2014-08-31 NOTE — Telephone Encounter (Signed)
Called and informed pt of normal CEA results. Pt verbalized understanding and denies any questions or concerns at this time.

## 2014-08-31 NOTE — Telephone Encounter (Signed)
-----   Message from Domenic Schwab, RN sent at 08/31/2014  9:24 AM EDT -----   ----- Message -----    From: [name], MD    Sent: 08/29/2014   8:09 AM      To: Brien Few, RN, Amy Tommi Rumps, RN  Please call patient, cea is normal

## 2014-09-03 ENCOUNTER — Encounter (HOSPITAL_COMMUNITY): Payer: Self-pay

## 2014-09-03 ENCOUNTER — Ambulatory Visit (HOSPITAL_COMMUNITY)
Admission: RE | Admit: 2014-09-03 | Discharge: 2014-09-03 | Disposition: A | Discharge status: Home / Self Care | Payer: Commercial Managed Care - HMO | Source: Ambulatory Visit | Attending: Oncology | Admitting: Oncology

## 2014-09-03 DIAGNOSIS — R918 Other nonspecific abnormal finding of lung field: Secondary | ICD-10-CM | POA: Diagnosis not present

## 2014-09-03 DIAGNOSIS — Z87891 Personal history of nicotine dependence: Secondary | ICD-10-CM | POA: Insufficient documentation

## 2014-09-07 ENCOUNTER — Telehealth: Payer: Self-pay | Admitting: Acute Care

## 2014-09-07 ENCOUNTER — Other Ambulatory Visit: Payer: Self-pay | Admitting: Acute Care

## 2014-09-07 DIAGNOSIS — R918 Other nonspecific abnormal finding of lung field: Secondary | ICD-10-CM

## 2014-09-07 NOTE — Telephone Encounter (Signed)
I spoke with Kelli [name] by phone about follow up diagnostics for her Lung Rads 4B pulmonary nodule. She is scheduled for a PET scan as follow up diagnostic on 4/13 at 1400. She understands where to go and also was instructed not to eat or drink  anything with sugar for 6 hours prior to the study. She understands she can drink water prior to the study with no restrictions.She stated she is not diabetic.[name] knows to arrive to [facility] Radiology Dept. At 13:45 on 4/13 for her appointment. I gave her my contact information if she has any further questions or concerns.

## 2014-09-15 ENCOUNTER — Encounter: Payer: Self-pay | Admitting: Acute Care

## 2014-09-16 ENCOUNTER — Ambulatory Visit (HOSPITAL_COMMUNITY)
Admission: RE | Admit: 2014-09-16 | Discharge: 2014-09-16 | Disposition: A | Discharge status: Home / Self Care | Payer: Commercial Managed Care - HMO | Source: Ambulatory Visit | Attending: Acute Care | Admitting: Acute Care

## 2014-09-16 DIAGNOSIS — R911 Solitary pulmonary nodule: Secondary | ICD-10-CM | POA: Insufficient documentation

## 2014-09-16 DIAGNOSIS — R918 Other nonspecific abnormal finding of lung field: Secondary | ICD-10-CM

## 2014-09-16 LAB — GLUCOSE, CAPILLARY: Glucose-Capillary: 140 mg/dL — ABNORMAL HIGH (ref 70–99)

## 2014-09-16 MED ORDER — FLUDEOXYGLUCOSE F - 18 (FDG) INJECTION
8.8000 | Freq: Once | INTRAVENOUS | Status: AC | PRN
  Administered 2014-09-16: 8.8 via INTRAVENOUS

## 2014-09-17 ENCOUNTER — Other Ambulatory Visit: Payer: Self-pay | Admitting: Acute Care

## 2014-09-17 ENCOUNTER — Telehealth: Payer: Self-pay | Admitting: Acute Care

## 2014-09-17 ENCOUNTER — Telehealth: Payer: Self-pay | Admitting: *Deleted

## 2014-09-17 DIAGNOSIS — R918 Other nonspecific abnormal finding of lung field: Secondary | ICD-10-CM

## 2014-09-17 NOTE — Telephone Encounter (Signed)
Referral to thoracic nurse navigator from [facility].

## 2014-09-17 NOTE — Telephone Encounter (Signed)
Per thoracic clinic today, I set patient up with appt to see T-surgery.  She will be seen 09/23/14 arrive at 2:45.  Patient verbalized understanding of appt time and place.

## 2014-09-17 NOTE — Telephone Encounter (Signed)
Kelli [name] was presented this morning in the TCC conference. I called her at 0900 to let her know that the lung nodule that was concerning on the screening CT scan, did show some low grade activity on PET scan. This information in addition to the growth, and characteristics of the nodule were concerning for a primary adenocarcinoma. The patient has had colon cancer in the past, and asked very appropriate questions. I explained to her that there was no additional activity on PET scan,which was a good sign, and that there was a suspicion of stage 1 that we would like to have her see a thoracic surgeon about having removed.I explained to her that Norton Blizzard, Nurse Navigator for the [facility], would call her today to facilitate scheduling her with thoracic surgery, and [facility]. I also explained to her that I would be ordering pulmonary function tests for the thoracic surgeon to review as they were planning her treatment options.When asked if she had any additional questions she stated that she did not. I confirmed that she had my contact information, and asked her to feel free to call me with any questions or concerns she may have.Dr. Lamonte Sakai, Dr. Evert Kohl  and Earlie Server were all present for T[facility] and participated in review of this patient's scans, and tentative treatment plan. Before saying goodbye, I asked the patient if she was ok, and she stated that she is ready to get started with the process of removing the nodule, but that she was ok.

## 2014-09-18 ENCOUNTER — Other Ambulatory Visit (HOSPITAL_COMMUNITY)
Admission: RE | Admit: 2014-09-18 | Discharge: 2014-09-18 | Disposition: A | Discharge status: Home / Self Care | Payer: Commercial Managed Care - HMO | Source: Ambulatory Visit | Attending: Oncology | Admitting: Oncology

## 2014-09-18 DIAGNOSIS — C189 Malignant neoplasm of colon, unspecified: Secondary | ICD-10-CM | POA: Diagnosis present

## 2014-09-21 ENCOUNTER — Other Ambulatory Visit: Payer: Self-pay | Admitting: *Deleted

## 2014-09-21 ENCOUNTER — Telehealth: Payer: Self-pay | Admitting: *Deleted

## 2014-09-21 NOTE — [facility] Oncology Navigator Note (Unsigned)
I looked to see if PFT's have been scheduled and they have not.  PFT's ordered on 09/17/14.  I called resp therapy at [facility] and left vm message to call.  I also contacted Sela Hilding NP regarding scheduling.

## 2014-09-21 NOTE — Telephone Encounter (Signed)
Called resp therapy dept.  I asked about PFT order placed on 09/17/14.  They stated due to order not stating specific location, it was not scheduled. I scheduled and called patient with appt time and day.  I also gave her pre-procedure instructions.  She verbalized understanding.

## 2014-09-22 ENCOUNTER — Ambulatory Visit (HOSPITAL_COMMUNITY)
Admission: RE | Admit: 2014-09-22 | Discharge: 2014-09-22 | Disposition: A | Discharge status: Home / Self Care | Payer: Commercial Managed Care - HMO | Source: Ambulatory Visit | Attending: Acute Care | Admitting: Acute Care

## 2014-09-22 DIAGNOSIS — R918 Other nonspecific abnormal finding of lung field: Secondary | ICD-10-CM | POA: Diagnosis not present

## 2014-09-22 LAB — PULMONARY FUNCTION TEST
DL/VA % PRED: 59 %
DL/VA: 3.17 ml/min/mmHg/L
DLCO unc % pred: 49 %
DLCO unc: 15.21 ml/min/mmHg
FEF 25-75 Post: 1.89 L/sec
FEF 25-75 Pre: 1.22 L/sec
FEF2575-%CHANGE-POST: 54 %
FEF2575-%PRED-POST: 80 %
FEF2575-%PRED-PRE: 52 %
FEV1-%CHANGE-POST: 8 %
FEV1-%Pred-Post: 77 %
FEV1-%Pred-Pre: 71 %
FEV1-POST: 2.22 L
FEV1-PRE: 2.04 L
FEV1FVC-%Change-Post: 2 %
FEV1FVC-%PRED-PRE: 90 %
FEV6-%CHANGE-POST: 7 %
FEV6-%Pred-Post: 84 %
FEV6-%Pred-Pre: 79 %
FEV6-PRE: 2.85 L
FEV6-Post: 3.06 L
FEV6FVC-%Change-Post: 1 %
FEV6FVC-%PRED-PRE: 101 %
FEV6FVC-%Pred-Post: 103 %
FVC-%Change-Post: 6 %
FVC-%Pred-Post: 82 %
FVC-%Pred-Pre: 77 %
FVC-Post: 3.09 L
FVC-Pre: 2.92 L
POST FEV1/FVC RATIO: 72 %
Post FEV6/FVC ratio: 99 %
Pre FEV1/FVC ratio: 70 %
Pre FEV6/FVC Ratio: 98 %
RV % pred: 99 %
RV: 2.37 L
TLC % pred: 92 %
TLC: 5.38 L

## 2014-09-22 MED ORDER — ALBUTEROL SULFATE (2.5 MG/3ML) 0.083% IN NEBU
2.5000 mg | INHALATION_SOLUTION | Freq: Once | RESPIRATORY_TRACT | Status: AC
  Administered 2014-09-22: 2.5 mg via RESPIRATORY_TRACT

## 2014-09-23 ENCOUNTER — Other Ambulatory Visit: Payer: Self-pay | Admitting: Acute Care

## 2014-09-23 ENCOUNTER — Other Ambulatory Visit: Payer: Self-pay | Admitting: *Deleted

## 2014-09-23 ENCOUNTER — Encounter: Payer: Self-pay | Admitting: Thoracic Surgery (Cardiothoracic Vascular Surgery)

## 2014-09-23 ENCOUNTER — Institutional Professional Consult (permissible substitution) (INDEPENDENT_AMBULATORY_CARE_PROVIDER_SITE_OTHER): Payer: Commercial Managed Care - HMO | Admitting: Thoracic Surgery (Cardiothoracic Vascular Surgery)

## 2014-09-23 ENCOUNTER — Encounter (HOSPITAL_COMMUNITY): Payer: Self-pay

## 2014-09-23 VITALS — BP 101/62 | HR 69 | Resp 20 | Ht 69.0 in | Wt 178.0 lb

## 2014-09-23 DIAGNOSIS — R911 Solitary pulmonary nodule: Secondary | ICD-10-CM

## 2014-09-23 DIAGNOSIS — R918 Other nonspecific abnormal finding of lung field: Secondary | ICD-10-CM

## 2014-09-23 NOTE — Progress Notes (Signed)
PCP is Ellsworth Lennox, MD Referring Provider is [name], MD  Chief Complaint  Patient presents with  . Lung Lesion    surgical eval,  PET Scan 09/17/14, Chest CT 09/03/14    HPI: 69 year old woman sent for consultation regarding a right lower lobe lung nodule.  Kelli [name] is a 69 year old woman with a history of colon cancer and tobacco abuse. She had colon cancer in 2010 and has had no evidence of recurrence since that time. She smoked over 2 packs per day for about 40 years before quitting in 2010. She has no history of heart disease or COPD.  She had been followed by [facility] for many years. She recently established care with Dr. Julieanne Manson. She was given a clean bill of health as to her: Cancer. However given her smoking history he recommended a screening CT. She had had CTs previously for colon cancer have multiple lung nodules. These have been stable for the most part except for one in the right lower lobe that have been slowly growing.  On the screening CT the right lower lobe nodule was noted to a point again increased in size. A PET CT was done and it showed the lesion was mildly hypermetabolic.  She denies headaches, visual changes, fevers, chills, sweats, cough, hemoptysis, wheezing, shortness of breath, chest pain, pressure, or tightness, change in appetite, or weight loss.  Zubrod Score: At the time of surgery this patient's most appropriate activity status/level should be described as: '[x]'$     0    Normal activity, no symptoms '[]'$     1    Restricted in physical strenuous activity but ambulatory, able to do out light work '[]'$     2    Ambulatory and capable of self care, unable to do work activities, up and about >50 % of waking hours                              '[]'$     3    Only limited self care, in bed greater than 50% of waking hours '[]'$     4    Completely disabled, no self care, confined to bed or chair '[]'$     5    Moribund    Past Medical History  Diagnosis  Date  . H/O colon cancer, stage II 09/19/2011    T3N0  Cecum Sept 2010  . Lung nodules 09/19/2011    granulomas  . Depression with anxiety 09/19/2011  . Hyperlipidemia 09/19/2011  . Colon cancer     colon ca dx 02/2009    Past Surgical History  Procedure Laterality Date  . Breast surgery    . Colon surgery      2010    Family History  Problem Relation Age of Onset  . Heart disease Mother   . Heart disease Brother     Social History History  Substance Use Topics  . Smoking status: Former Smoker -- 3.00 packs/day for 40 years    Types: Cigarettes    Quit date: 02/06/2009  . Smokeless tobacco: Never Used  . Alcohol Use: 0.0 oz/week    0 Standard drinks or equivalent per week     Comment: 3-4 times a week 1-2 beers    Current Outpatient Prescriptions  Medication Sig Dispense Refill  . aspirin 81 MG tablet Take 81 mg by mouth daily.    [name] b complex vitamins tablet Take 1 tablet by mouth daily.    [name]  cholecalciferol (VITAMIN D) 1000 UNITS tablet Take 2,000 Units by mouth daily.    [name] FLUoxetine (PROZAC) 20 MG capsule Take 2 capsules (40 mg total) by mouth daily. 180 capsule 1  . pravastatin (PRAVACHOL) 40 MG tablet Take 1 tablet (40 mg total) by mouth daily. 90 tablet 3   No current facility-administered medications for this visit.    No Known Allergies  Review of Systems  Constitutional: Negative for fever, chills, activity change, appetite change and unexpected weight change.       Tired  Eyes: Negative for visual disturbance.  Respiratory: Negative for cough, chest tightness, shortness of breath and wheezing.   Cardiovascular: Negative for chest pain, palpitations and leg swelling.  Gastrointestinal: Negative for nausea, vomiting, diarrhea and blood in stool.  Genitourinary: Negative.   Musculoskeletal: Positive for arthralgias (index finger).  Neurological: Negative for seizures, weakness and headaches.  Hematological: Negative for adenopathy. Does not bruise/bleed  easily.    BP 101/62 mmHg  Pulse 69  Resp 20  Ht '5\' 9"'$  (1.753 m)  Wt 178 lb (80.74 kg)  BMI 26.27 kg/m2  SpO2 97% Physical Exam  Constitutional: She is oriented to person, place, and time. She appears well-developed and well-nourished. No distress.  HENT:  Head: Normocephalic and atraumatic.  Eyes: EOM are normal. Pupils are equal, round, and reactive to light.  Neck: No thyromegaly present.  Cardiovascular: Normal rate, regular rhythm, normal heart sounds and intact distal pulses.  Exam reveals no gallop.   No murmur heard. Pulmonary/Chest: Effort normal and breath sounds normal. She has no wheezes. She has no rales.  Abdominal: Soft. There is no tenderness.  Musculoskeletal: She exhibits no edema.  Lymphadenopathy:    She has no cervical adenopathy.  Neurological: She is alert and oriented to person, place, and time. No cranial nerve deficit.  Skin: Skin is warm and dry.  Vitals reviewed.    Diagnostic Tests: CT scan and PET CT images and reports reviewed and also reviewed with the patient.  CT CHEST WITHOUT CONTRAST  TECHNIQUE: Multidetector CT imaging of the chest was performed following the standard protocol without IV contrast.  COMPARISON: Standard chest CT 09/12/2011, 03/14/2011, 09/12/2010, 03/14/2010 and 02/11/2009. PET 02/12/2009.  FINDINGS: Mediastinum/Nodes: No pathologically enlarged mediastinal or axillary lymph nodes. Hilar regions are difficult to definitively evaluate without IV contrast but appear grossly unremarkable. Heart size normal. No pericardial effusion.  Lungs/Pleura: A spiculated nodule in the lateral right lower lobe measures 13 x 17 mm (series 4, image 208), previously 12 x 14 mm on 03/14/2011. It has spiculated margins and tags to the adjacent pleura. Ground-glass nodule in the left upper lobe measures 7 mm (series 4, image 138), faintly seen on the prior study and likely better seen on today's study due to thinner slice  collimation. Multiple additional scattered smaller pulmonary nodules measure up to 5 mm in the right lower lobe (image 139), stable. No pleural fluid. Airway is unremarkable.  Upper abdomen: Liver appears slightly heterogeneous in decreased attenuation. 6 mm low-attenuation lesion in the left hepatic lobe is unchanged, favoring a benign lesion such as a cyst. Visualized portions of the liver, gallbladder, adrenal glands, kidneys, spleen, pancreas, stomach and bowel are otherwise grossly unremarkable. No upper abdominal adenopathy.  Musculoskeletal: A 2.0 x 2.8 cm fluid density lesion is seen deep to the skin surface, inferior to the left breast, minimally larger than on 02/11/2009, at which time it measured 1.6 x 2.6 cm. A sebaceous cyst is most likely. No worrisome lytic  or sclerotic lesions.  IMPRESSION: 1. A spiculated right lower lobe nodule shows indolent growth and is worrisome for adenocarcinoma. Lung-RADS Category 4B, suspicious. Consultation with Pulmonary Medicine or thoracic surgery, and subsequent PET, recommended. These results will be called to the ordering clinician or representative by the Radiologist Assistant, and communication documented in the PACS or zVision Dashboard. 2. Additional scattered pulmonary nodules, as described above, likely stable given differences in slice collimation. Continued attention on followup exams is warranted. 3. Liver may be fatty.   Electronically Signed  By: Lorin Picket M.D.  On: 09/03/2014 16:12 NUCLEAR MEDICINE PET SKULL BASE TO THIGH  TECHNIQUE: 8 point a mCi F-18 FDG was injected intravenously. Full-ring PET imaging was performed from the skull base to thigh after the radiotracer. CT data was obtained and used for attenuation correction and anatomic localization.  FASTING BLOOD GLUCOSE: Value: 140 mg/dl  COMPARISON: CT chest from 09/03/2014. PET-CT from 02/12/2009.  FINDINGS: NECK  No  hypermetabolic lymph nodes in the neck.  CHEST  The dominant right lower lobe pulmonary lesions seen on the recent screening CT scan shows low level FDG uptake with SUV max = 1.5. No definite FDG accumulation can be seen in the other pulmonary nodules, but these are all tiny and below the accepted size threshold for reliable resolution on PET imaging.  ABDOMEN/PELVIS  2.6 cm soft tissue nodule in the subcutaneous fat of the upper left anterior abdominal wall shows no hypermetabolism.  No other hypermetabolic disease is identified in the abdomen or pelvis.  Atherosclerotic calcification is seen in the abdominal aorta without aneurysm.  SKELETON  No focal hypermetabolic activity to suggest skeletal metastasis.  IMPRESSION: 17 mm spiculated nodule in the lateral right lower lobe shows low level FDG uptake. This nodule was documented to have progressed over multiple CT scans, a finding concerning for neoplasm. The low level uptake today may be related to a low-grade or poorly differentiated neoplasm.   Electronically Signed  By: Misty Stanley M.D.  On: 09/16/2014 16:09  Pulmonary function tests  FVC 2.92 (77%) FEV1 2.04 (71%) FEV1 postbronchodilator 2.22 (77%) DLCO 49%  Impression: 69 year old woman with a history of tobacco abuse and colon cancer (2010). She has a very slowly enlarging right lower lobe nodule by CT scan. On PET CT this nodule is mildly hypermetabolic with an SUV max of 1.5. This lesion is suspicious for a low-grade neoplasm such as an adenocarcinoma with lepidic growth or a carcinoid tumor.  I discussed 3 separate options with Kelli [name]. I reviewed the advantages and disadvantages of each approach. Option one was to continue to follow this radiographically. The second possibility would be to do a CT-guided needle biopsy and then treat based on that result. The final option would be to go ahead and do a wedge resection of this nodule for  definitive diagnosis and treatment.  After discussion of the options, Kelli [name] wishes to proceed with a wedge resection and possible segmentectomy if necessary.  I described the proposed operation in detail. We will be planning to do a right VATS, wedge resection of right lower lobe nodule, possible segmentectomy, cryoablation of intercostal nerves. We discussed the general nature of the surgery, the incisions to be used, the need for general anesthesia, the decision making, expected hospital stay, and overall recovery. I reviewed the indications, risks, benefits, and alternatives. She understands the risks include but are not limited to death, MI, DVT, PE, bleeding, possible need for transfusion, stroke, infection, air leak, irregular heart rhythms, as  well as the possibility of unforeseeable, occasions.  She understands and accepts the risk and agrees to proceed.  Plan: Right VATS, wedge resection, possible segmentectomy, Crile analgesia of intercostal nerves on Monday, 10/05/2014  [name], MD [facility] [phone]

## 2014-09-30 NOTE — Pre-Procedure Instructions (Signed)
Kelli [name]  09/30/2014   Your procedure is scheduled on:  May 2, Monday   Report to [facility] Admitting at  5:30 AM.  Call this number if you have problems the morning of surgery: [phone]   Remember:   Do not eat food or drink liquids after midnight Sunday.    Take these medicines the morning of surgery with A SIP OF WATER: Prozac   Do not wear jewelry, make-up or nail polish.  Do not wear lotions, powders, or perfumes. You may NOT wear deodorant the day of surgery.  Do not shave underarms & legs 48 hours prior to surgery.    Do not bring valuables to the hospital.  [facility] is not responsible for any belongings or valuables.               Contacts, dentures or bridgework may not be worn into surgery.  Leave suitcase in the car. After surgery it may be brought to your room.  For patients admitted to the hospital, discharge time is determined by your treatment team.    Name and phone number of your driver:    Special Instructions: "Preparing for Surgery" instruction sheet.   Please read over the following fact sheets that you were given: Pain Booklet, Coughing and Deep Breathing, Blood Transfusion Information, MRSA Information and Surgical Site Infection Prevention

## 2014-10-01 ENCOUNTER — Encounter (HOSPITAL_COMMUNITY): Payer: Self-pay

## 2014-10-01 ENCOUNTER — Encounter (HOSPITAL_COMMUNITY)
Admission: RE | Admit: 2014-10-01 | Discharge: 2014-10-01 | Disposition: A | Discharge status: Home / Self Care | Payer: Commercial Managed Care - HMO | Source: Ambulatory Visit | Attending: Thoracic Surgery (Cardiothoracic Vascular Surgery) | Admitting: Thoracic Surgery (Cardiothoracic Vascular Surgery)

## 2014-10-01 VITALS — BP 119/46 | HR 71 | Temp 98.1°F | Ht 69.0 in | Wt 176.9 lb

## 2014-10-01 DIAGNOSIS — R911 Solitary pulmonary nodule: Secondary | ICD-10-CM

## 2014-10-01 HISTORY — DX: Major depressive disorder, single episode, unspecified: F32.9

## 2014-10-01 HISTORY — DX: Pure hypercholesterolemia, unspecified: E78.00

## 2014-10-01 HISTORY — DX: Urgency of urination: R39.15

## 2014-10-01 HISTORY — DX: Depression, unspecified: F32.A

## 2014-10-01 HISTORY — DX: Anxiety disorder, unspecified: F41.9

## 2014-10-01 LAB — COMPREHENSIVE METABOLIC PANEL
ALT: 34 U/L (ref 0–35)
AST: 34 U/L (ref 0–37)
Albumin: 4 g/dL (ref 3.5–5.2)
Alkaline Phosphatase: 73 U/L (ref 39–117)
Anion gap: 8 (ref 5–15)
BILIRUBIN TOTAL: 0.8 mg/dL (ref 0.3–1.2)
BUN: 11 mg/dL (ref 6–23)
CALCIUM: 9.4 mg/dL (ref 8.4–10.5)
CHLORIDE: 104 mmol/L (ref 96–112)
CO2: 26 mmol/L (ref 19–32)
Creatinine, Ser: 0.82 mg/dL (ref 0.50–1.10)
GFR, EST AFRICAN AMERICAN: 85 mL/min — AB (ref >90)
GFR, EST NON AFRICAN AMERICAN: 73 mL/min — AB (ref >90)
GLUCOSE: 148 mg/dL — AB (ref 70–99)
POTASSIUM: 3.9 mmol/L (ref 3.5–5.1)
SODIUM: 138 mmol/L (ref 135–145)
Total Protein: 6.9 g/dL (ref 6.0–8.3)

## 2014-10-01 LAB — TYPE AND SCREEN
ABO/RH(D): O POS
ANTIBODY SCREEN: NEGATIVE

## 2014-10-01 LAB — URINALYSIS, ROUTINE W REFLEX MICROSCOPIC
Bilirubin Urine: NEGATIVE
Glucose, UA: NEGATIVE mg/dL
HGB URINE DIPSTICK: NEGATIVE
Ketones, ur: NEGATIVE mg/dL
NITRITE: NEGATIVE
PROTEIN: NEGATIVE mg/dL
Specific Gravity, Urine: 1.027 (ref 1.005–1.030)
UROBILINOGEN UA: 0.2 mg/dL (ref 0.0–1.0)
pH: 5.5 (ref 5.0–8.0)

## 2014-10-01 LAB — BLOOD GAS, ARTERIAL
Acid-base deficit: 0.3 mmol/L (ref 0.0–2.0)
Bicarbonate: 23.3 mEq/L (ref 20.0–24.0)
Drawn by: 206361
O2 CONTENT: 21 L/min
O2 SAT: 96.8 %
PATIENT TEMPERATURE: 98.6
PH ART: 7.439 (ref 7.350–7.450)
TCO2: 24.4 mmol/L (ref 0–100)
pCO2 arterial: 35 mmHg (ref 35.0–45.0)
pO2, Arterial: 88.7 mmHg (ref 80.0–100.0)

## 2014-10-01 LAB — CBC
HCT: 39.4 % (ref 36.0–46.0)
HEMOGLOBIN: 13 g/dL (ref 12.0–15.0)
MCH: 30.8 pg (ref 26.0–34.0)
MCHC: 33 g/dL (ref 30.0–36.0)
MCV: 93.4 fL (ref 78.0–100.0)
Platelets: 298 10*3/uL (ref 150–400)
RBC: 4.22 MIL/uL (ref 3.87–5.11)
RDW: 13 % (ref 11.5–15.5)
WBC: 8.5 10*3/uL (ref 4.0–10.5)

## 2014-10-01 LAB — URINE MICROSCOPIC-ADD ON

## 2014-10-01 LAB — SURGICAL PCR SCREEN
MRSA, PCR: NEGATIVE
STAPHYLOCOCCUS AUREUS: NEGATIVE

## 2014-10-01 LAB — PROTIME-INR
INR: 0.96 (ref 0.00–1.49)
Prothrombin Time: 12.9 seconds (ref 11.6–15.2)

## 2014-10-01 LAB — APTT: aPTT: 28 seconds (ref 24–37)

## 2014-10-01 NOTE — Progress Notes (Signed)
   10/01/14 1228  OBSTRUCTIVE SLEEP APNEA  Have you ever been diagnosed with sleep apnea through a sleep study? No  Do you snore loudly (loud enough to be heard through closed doors)?  0  Do you often feel tired, fatigued, or sleepy during the daytime? 0  Has anyone observed you stop breathing during your sleep? 0  Do you have, or are you being treated for high blood pressure? 0  BMI more than 35 kg/m2? 0  Age over 67 years old? 1  Neck circumference greater than 40 cm/16 inches? 0  Gender: 0

## 2014-10-03 ENCOUNTER — Encounter (HOSPITAL_COMMUNITY): Payer: Self-pay | Admitting: Certified Registered Nurse Anesthetist

## 2014-10-04 MED ORDER — DEXTROSE 5 % IV SOLN
1.5000 g | INTRAVENOUS | Status: AC
  Administered 2014-10-05: 1.5 g via INTRAVENOUS
  Filled 2014-10-04: qty 1.5

## 2014-10-05 ENCOUNTER — Inpatient Hospital Stay (HOSPITAL_COMMUNITY): Payer: Commercial Managed Care - HMO | Admitting: Certified Registered Nurse Anesthetist

## 2014-10-05 ENCOUNTER — Telehealth: Payer: Self-pay | Admitting: *Deleted

## 2014-10-05 ENCOUNTER — Inpatient Hospital Stay (HOSPITAL_COMMUNITY): Payer: Commercial Managed Care - HMO

## 2014-10-05 ENCOUNTER — Encounter (HOSPITAL_COMMUNITY)
Admission: RE | Disposition: A | Discharge status: Home / Self Care | Payer: Self-pay | Source: Ambulatory Visit | Attending: Thoracic Surgery (Cardiothoracic Vascular Surgery)

## 2014-10-05 ENCOUNTER — Encounter (HOSPITAL_COMMUNITY): Payer: Self-pay | Admitting: *Deleted

## 2014-10-05 ENCOUNTER — Inpatient Hospital Stay (HOSPITAL_COMMUNITY)
Admission: RE | Admit: 2014-10-05 | Discharge: 2014-10-13 | DRG: 164 | Disposition: A | Discharge status: Home / Self Care | Payer: Commercial Managed Care - HMO | Source: Ambulatory Visit | Attending: Thoracic Surgery (Cardiothoracic Vascular Surgery) | Admitting: Thoracic Surgery (Cardiothoracic Vascular Surgery)

## 2014-10-05 DIAGNOSIS — Z9689 Presence of other specified functional implants: Secondary | ICD-10-CM

## 2014-10-05 DIAGNOSIS — Z419 Encounter for procedure for purposes other than remedying health state, unspecified: Secondary | ICD-10-CM

## 2014-10-05 DIAGNOSIS — Z4682 Encounter for fitting and adjustment of non-vascular catheter: Secondary | ICD-10-CM

## 2014-10-05 DIAGNOSIS — Z85038 Personal history of other malignant neoplasm of large intestine: Secondary | ICD-10-CM

## 2014-10-05 DIAGNOSIS — E780 Pure hypercholesterolemia: Secondary | ICD-10-CM | POA: Diagnosis present

## 2014-10-05 DIAGNOSIS — Z87891 Personal history of nicotine dependence: Secondary | ICD-10-CM

## 2014-10-05 DIAGNOSIS — F418 Other specified anxiety disorders: Secondary | ICD-10-CM | POA: Diagnosis present

## 2014-10-05 DIAGNOSIS — C3430 Malignant neoplasm of lower lobe, unspecified bronchus or lung: Secondary | ICD-10-CM

## 2014-10-05 DIAGNOSIS — Z8249 Family history of ischemic heart disease and other diseases of the circulatory system: Secondary | ICD-10-CM | POA: Diagnosis not present

## 2014-10-05 DIAGNOSIS — C3431 Malignant neoplasm of lower lobe, right bronchus or lung: Principal | ICD-10-CM | POA: Diagnosis present

## 2014-10-05 DIAGNOSIS — Z79899 Other long term (current) drug therapy: Secondary | ICD-10-CM | POA: Diagnosis not present

## 2014-10-05 DIAGNOSIS — E785 Hyperlipidemia, unspecified: Secondary | ICD-10-CM | POA: Diagnosis not present

## 2014-10-05 DIAGNOSIS — D62 Acute posthemorrhagic anemia: Secondary | ICD-10-CM | POA: Diagnosis not present

## 2014-10-05 DIAGNOSIS — Z902 Acquired absence of lung [part of]: Secondary | ICD-10-CM

## 2014-10-05 DIAGNOSIS — R911 Solitary pulmonary nodule: Secondary | ICD-10-CM | POA: Diagnosis present

## 2014-10-05 DIAGNOSIS — Z7982 Long term (current) use of aspirin: Secondary | ICD-10-CM

## 2014-10-05 DIAGNOSIS — J939 Pneumothorax, unspecified: Secondary | ICD-10-CM

## 2014-10-05 DIAGNOSIS — Z85118 Personal history of other malignant neoplasm of bronchus and lung: Secondary | ICD-10-CM

## 2014-10-05 HISTORY — PX: VIDEO ASSISTED THORACOSCOPY (VATS)/WEDGE RESECTION: SHX6174

## 2014-10-05 HISTORY — PX: CRYO INTERCOSTAL NERVE BLOCK: SHX6522

## 2014-10-05 HISTORY — PX: LYMPH NODE DISSECTION: SHX5087

## 2014-10-05 HISTORY — PX: SEGMENTECOMY: SHX5076

## 2014-10-05 LAB — GLUCOSE, CAPILLARY: GLUCOSE-CAPILLARY: 215 mg/dL — AB (ref 70–99)

## 2014-10-05 SURGERY — VIDEO ASSISTED THORACOSCOPY (VATS)/WEDGE RESECTION
Anesthesia: General | Site: Chest | Laterality: Right

## 2014-10-05 MED ORDER — MIDAZOLAM HCL 5 MG/5ML IJ SOLN
INTRAMUSCULAR | Status: DC | PRN
  Administered 2014-10-05 (×2): 1 mg via INTRAVENOUS

## 2014-10-05 MED ORDER — FENTANYL CITRATE (PF) 250 MCG/5ML IJ SOLN
INTRAMUSCULAR | Status: AC
  Filled 2014-10-05: qty 5

## 2014-10-05 MED ORDER — SODIUM CHLORIDE 0.9 % IJ SOLN: 9.0000 mL | INTRAMUSCULAR | Status: DC | PRN

## 2014-10-05 MED ORDER — ACETAMINOPHEN 160 MG/5ML PO SOLN: 325.0000 mg | ORAL | Status: DC | PRN

## 2014-10-05 MED ORDER — PROPOFOL 10 MG/ML IV BOLUS
INTRAVENOUS | Status: DC | PRN
  Administered 2014-10-05: 50 mg via INTRAVENOUS
  Administered 2014-10-05: 30 mg via INTRAVENOUS
  Administered 2014-10-05: 60 mg via INTRAVENOUS
  Administered 2014-10-05: 40 mg via INTRAVENOUS

## 2014-10-05 MED ORDER — PANTOPRAZOLE SODIUM 40 MG PO TBEC
40.0000 mg | DELAYED_RELEASE_TABLET | Freq: Every day | ORAL | Status: DC
  Administered 2014-10-05 – 2014-10-13 (×9): 40 mg via ORAL
  Filled 2014-10-05 (×9): qty 1

## 2014-10-05 MED ORDER — LIDOCAINE HCL (CARDIAC) 20 MG/ML IV SOLN
INTRAVENOUS | Status: DC | PRN
  Administered 2014-10-05 (×2): 30 mg via INTRAVENOUS
  Administered 2014-10-05: 40 mg via INTRAVENOUS
  Administered 2014-10-05: 30 mg via INTRAVENOUS

## 2014-10-05 MED ORDER — VECURONIUM BROMIDE 10 MG IV SOLR
INTRAVENOUS | Status: DC | PRN
  Administered 2014-10-05: 3 mg via INTRAVENOUS

## 2014-10-05 MED ORDER — HYDROMORPHONE HCL 1 MG/ML IJ SOLN
INTRAMUSCULAR | Status: AC
Start: 2014-10-05 — End: 2014-10-05
  Filled 2014-10-05: qty 1

## 2014-10-05 MED ORDER — ROCURONIUM BROMIDE 50 MG/5ML IV SOLN
INTRAVENOUS | Status: AC
  Filled 2014-10-05: qty 1

## 2014-10-05 MED ORDER — NALOXONE HCL 0.4 MG/ML IJ SOLN: 0.4000 mg | INTRAMUSCULAR | Status: DC | PRN

## 2014-10-05 MED ORDER — ASPIRIN EC 81 MG PO TBEC
81.0000 mg | DELAYED_RELEASE_TABLET | Freq: Every day | ORAL | Status: DC
  Administered 2014-10-06 – 2014-10-13 (×8): 81 mg via ORAL
  Filled 2014-10-05 (×8): qty 1

## 2014-10-05 MED ORDER — DIPHENHYDRAMINE HCL 12.5 MG/5ML PO ELIX: 12.5000 mg | ORAL_SOLUTION | Freq: Four times a day (QID) | ORAL | Status: DC | PRN

## 2014-10-05 MED ORDER — KCL IN DEXTROSE-NACL 20-5-0.9 MEQ/L-%-% IV SOLN
INTRAVENOUS | Status: DC
  Administered 2014-10-06 – 2014-10-07 (×3): via INTRAVENOUS
  Filled 2014-10-05 (×9): qty 1000

## 2014-10-05 MED ORDER — ACETAMINOPHEN 500 MG PO TABS
1000.0000 mg | ORAL_TABLET | Freq: Four times a day (QID) | ORAL | Status: AC
  Administered 2014-10-05 – 2014-10-10 (×10): 1000 mg via ORAL
  Filled 2014-10-05 (×18): qty 2

## 2014-10-05 MED ORDER — OXYCODONE-ACETAMINOPHEN 5-325 MG PO TABS: 1.0000 | ORAL_TABLET | ORAL | Status: DC | PRN

## 2014-10-05 MED ORDER — HYDROMORPHONE HCL 1 MG/ML IJ SOLN
INTRAMUSCULAR | Status: AC
  Filled 2014-10-05: qty 1

## 2014-10-05 MED ORDER — PRAVASTATIN SODIUM 40 MG PO TABS
40.0000 mg | ORAL_TABLET | Freq: Every day | ORAL | Status: DC
  Administered 2014-10-05 – 2014-10-12 (×8): 40 mg via ORAL
  Filled 2014-10-05 (×9): qty 1

## 2014-10-05 MED ORDER — TRAMADOL HCL 50 MG PO TABS
50.0000 mg | ORAL_TABLET | Freq: Four times a day (QID) | ORAL | Status: DC | PRN
  Administered 2014-10-05 – 2014-10-07 (×4): 100 mg via ORAL
  Filled 2014-10-05 (×4): qty 2

## 2014-10-05 MED ORDER — 0.9 % SODIUM CHLORIDE (POUR BTL) OPTIME
TOPICAL | Status: DC | PRN
  Administered 2014-10-05: 1000 mL

## 2014-10-05 MED ORDER — ONDANSETRON HCL 4 MG/2ML IJ SOLN
4.0000 mg | Freq: Four times a day (QID) | INTRAMUSCULAR | Status: DC | PRN
  Administered 2014-10-06 – 2014-10-09 (×3): 4 mg via INTRAVENOUS
  Filled 2014-10-05 (×3): qty 2

## 2014-10-05 MED ORDER — POTASSIUM CHLORIDE 10 MEQ/50ML IV SOLN
10.0000 meq | Freq: Every day | INTRAVENOUS | Status: DC | PRN
  Administered 2014-10-10 (×3): 10 meq via INTRAVENOUS
  Filled 2014-10-05 (×3): qty 50

## 2014-10-05 MED ORDER — ACETAMINOPHEN 160 MG/5ML PO SOLN
1000.0000 mg | Freq: Four times a day (QID) | ORAL | Status: AC
  Administered 2014-10-06: 1000 mg via ORAL
  Administered 2014-10-06: 500 mg via ORAL
  Administered 2014-10-06: 1000 mg via ORAL
  Filled 2014-10-05 (×20): qty 40

## 2014-10-05 MED ORDER — DEXTROSE 5 % IV SOLN
1.5000 g | Freq: Two times a day (BID) | INTRAVENOUS | Status: AC
  Administered 2014-10-05 – 2014-10-06 (×2): 1.5 g via INTRAVENOUS
  Filled 2014-10-05 (×2): qty 1.5

## 2014-10-05 MED ORDER — ONDANSETRON HCL 4 MG/2ML IJ SOLN: 4.0000 mg | Freq: Four times a day (QID) | INTRAMUSCULAR | Status: DC | PRN

## 2014-10-05 MED ORDER — NEOSTIGMINE METHYLSULFATE 10 MG/10ML IV SOLN
INTRAVENOUS | Status: DC | PRN
  Administered 2014-10-05: 3 mg via INTRAVENOUS

## 2014-10-05 MED ORDER — FENTANYL 10 MCG/ML IV SOLN
INTRAVENOUS | Status: DC
Start: 2014-10-05 — End: 2014-10-08
  Administered 2014-10-05: 12:00:00 via INTRAVENOUS
  Administered 2014-10-05: 300 ug via INTRAVENOUS
  Administered 2014-10-05: 170 ug via INTRAVENOUS
  Administered 2014-10-06: 139 ug via INTRAVENOUS
  Administered 2014-10-06: 270 ug via INTRAVENOUS
  Administered 2014-10-06: 220 ug via INTRAVENOUS
  Administered 2014-10-06 (×2): 200 ug via INTRAVENOUS
  Administered 2014-10-06: 170 ug via INTRAVENOUS
  Administered 2014-10-06: 05:00:00 via INTRAVENOUS
  Administered 2014-10-06: 210 ug via INTRAVENOUS
  Administered 2014-10-07: 200 ug via INTRAVENOUS
  Administered 2014-10-07: 100 ug via INTRAVENOUS
  Administered 2014-10-07: 210 ug via INTRAVENOUS
  Administered 2014-10-07: 300 ug via INTRAVENOUS
  Administered 2014-10-07: 190 ug via INTRAVENOUS
  Administered 2014-10-07: via INTRAVENOUS
  Administered 2014-10-07: 350 ug via INTRAVENOUS
  Administered 2014-10-08: 100 ug via INTRAVENOUS
  Filled 2014-10-05 (×11): qty 50

## 2014-10-05 MED ORDER — HYDROMORPHONE HCL 1 MG/ML IJ SOLN
0.2500 mg | INTRAMUSCULAR | Status: DC | PRN
  Administered 2014-10-05: 0.25 mg via INTRAVENOUS
  Administered 2014-10-05 (×2): 0.5 mg via INTRAVENOUS
  Administered 2014-10-05: 0.25 mg via INTRAVENOUS
  Administered 2014-10-05: 0.5 mg via INTRAVENOUS

## 2014-10-05 MED ORDER — GLYCOPYRROLATE 0.2 MG/ML IJ SOLN
INTRAMUSCULAR | Status: DC | PRN
  Administered 2014-10-05: 0.4 mg via INTRAVENOUS

## 2014-10-05 MED ORDER — INSULIN ASPART 100 UNIT/ML ~~LOC~~ SOLN
0.0000 [IU] | Freq: Four times a day (QID) | SUBCUTANEOUS | Status: DC
  Administered 2014-10-06: 4 [IU] via SUBCUTANEOUS
  Administered 2014-10-06: 2 [IU] via SUBCUTANEOUS
  Administered 2014-10-06: 4 [IU] via SUBCUTANEOUS
  Administered 2014-10-06: 8 [IU] via SUBCUTANEOUS
  Administered 2014-10-06 – 2014-10-07 (×3): 2 [IU] via SUBCUTANEOUS
  Administered 2014-10-07 (×2): 4 [IU] via SUBCUTANEOUS
  Administered 2014-10-08: 2 [IU] via SUBCUTANEOUS
  Administered 2014-10-08: 4 [IU] via SUBCUTANEOUS
  Administered 2014-10-08 – 2014-10-09 (×3): 2 [IU] via SUBCUTANEOUS

## 2014-10-05 MED ORDER — DEXAMETHASONE SODIUM PHOSPHATE 4 MG/ML IJ SOLN
INTRAMUSCULAR | Status: AC
  Filled 2014-10-05: qty 1

## 2014-10-05 MED ORDER — FENTANYL CITRATE (PF) 100 MCG/2ML IJ SOLN
INTRAMUSCULAR | Status: DC | PRN
  Administered 2014-10-05: 50 ug via INTRAVENOUS
  Administered 2014-10-05: 100 ug via INTRAVENOUS
  Administered 2014-10-05 (×2): 50 ug via INTRAVENOUS
  Administered 2014-10-05: 100 ug via INTRAVENOUS
  Administered 2014-10-05 (×3): 50 ug via INTRAVENOUS

## 2014-10-05 MED ORDER — LIDOCAINE HCL (CARDIAC) 20 MG/ML IV SOLN
INTRAVENOUS | Status: AC
  Filled 2014-10-05: qty 5

## 2014-10-05 MED ORDER — ROCURONIUM BROMIDE 100 MG/10ML IV SOLN
INTRAVENOUS | Status: DC | PRN
  Administered 2014-10-05: 50 mg via INTRAVENOUS

## 2014-10-05 MED ORDER — BISACODYL 5 MG PO TBEC
10.0000 mg | DELAYED_RELEASE_TABLET | Freq: Every day | ORAL | Status: DC
  Administered 2014-10-05 – 2014-10-13 (×8): 10 mg via ORAL
  Filled 2014-10-05 (×9): qty 2

## 2014-10-05 MED ORDER — ARTIFICIAL TEARS OP OINT
TOPICAL_OINTMENT | OPHTHALMIC | Status: AC
  Filled 2014-10-05: qty 3.5

## 2014-10-05 MED ORDER — ACETAMINOPHEN 325 MG PO TABS: 325.0000 mg | ORAL_TABLET | ORAL | Status: DC | PRN

## 2014-10-05 MED ORDER — LACTATED RINGERS IV SOLN
INTRAVENOUS | Status: DC | PRN
  Administered 2014-10-05 (×2): via INTRAVENOUS

## 2014-10-05 MED ORDER — HEMOSTATIC AGENTS (NO CHARGE) OPTIME
TOPICAL | Status: DC | PRN
  Administered 2014-10-05: 1 via TOPICAL

## 2014-10-05 MED ORDER — PROPOFOL 10 MG/ML IV BOLUS
INTRAVENOUS | Status: AC
  Filled 2014-10-05: qty 20

## 2014-10-05 MED ORDER — VECURONIUM BROMIDE 10 MG IV SOLR
INTRAVENOUS | Status: AC
  Filled 2014-10-05: qty 10

## 2014-10-05 MED ORDER — DIPHENHYDRAMINE HCL 50 MG/ML IJ SOLN
12.5000 mg | Freq: Four times a day (QID) | INTRAMUSCULAR | Status: DC | PRN
  Administered 2014-10-06: 12.5 mg via INTRAVENOUS
  Filled 2014-10-05: qty 1

## 2014-10-05 MED ORDER — NEOSTIGMINE METHYLSULFATE 10 MG/10ML IV SOLN
INTRAVENOUS | Status: AC
  Filled 2014-10-05: qty 1

## 2014-10-05 MED ORDER — ONDANSETRON HCL 4 MG/2ML IJ SOLN
INTRAMUSCULAR | Status: DC | PRN
  Administered 2014-10-05: 4 mg via INTRAVENOUS

## 2014-10-05 MED ORDER — EPHEDRINE SULFATE 50 MG/ML IJ SOLN
INTRAMUSCULAR | Status: AC
  Filled 2014-10-05: qty 1

## 2014-10-05 MED ORDER — GLYCOPYRROLATE 0.2 MG/ML IJ SOLN
INTRAMUSCULAR | Status: AC
  Filled 2014-10-05: qty 3

## 2014-10-05 MED ORDER — DEXAMETHASONE SODIUM PHOSPHATE 4 MG/ML IJ SOLN
INTRAMUSCULAR | Status: DC | PRN
  Administered 2014-10-05: 4 mg via INTRAVENOUS

## 2014-10-05 MED ORDER — ALBUMIN HUMAN 5 % IV SOLN
INTRAVENOUS | Status: DC | PRN
  Administered 2014-10-05: 08:00:00 via INTRAVENOUS

## 2014-10-05 MED ORDER — STERILE WATER FOR INJECTION IJ SOLN
INTRAMUSCULAR | Status: AC
  Filled 2014-10-05: qty 10

## 2014-10-05 MED ORDER — ONDANSETRON HCL 4 MG/2ML IJ SOLN
INTRAMUSCULAR | Status: AC
  Filled 2014-10-05: qty 2

## 2014-10-05 MED ORDER — HYDROMORPHONE HCL 1 MG/ML IJ SOLN
INTRAMUSCULAR | Status: DC | PRN
  Administered 2014-10-05 (×2): 0.5 mg via INTRAVENOUS

## 2014-10-05 MED ORDER — LACTATED RINGERS IV SOLN
INTRAVENOUS | Status: DC | PRN
  Administered 2014-10-05: 07:00:00 via INTRAVENOUS

## 2014-10-05 MED ORDER — EPHEDRINE SULFATE 50 MG/ML IJ SOLN
INTRAMUSCULAR | Status: DC | PRN
  Administered 2014-10-05 (×6): 5 mg via INTRAVENOUS

## 2014-10-05 MED ORDER — ARTIFICIAL TEARS OP OINT
TOPICAL_OINTMENT | OPHTHALMIC | Status: DC | PRN
  Administered 2014-10-05: 1 via OPHTHALMIC

## 2014-10-05 MED ORDER — FLUOXETINE HCL 20 MG PO CAPS
40.0000 mg | ORAL_CAPSULE | Freq: Every day | ORAL | Status: DC
  Administered 2014-10-05 – 2014-10-13 (×9): 40 mg via ORAL
  Filled 2014-10-05 (×9): qty 2

## 2014-10-05 MED ORDER — MIDAZOLAM HCL 2 MG/2ML IJ SOLN
INTRAMUSCULAR | Status: AC
  Filled 2014-10-05: qty 2

## 2014-10-05 MED ORDER — SENNOSIDES-DOCUSATE SODIUM 8.6-50 MG PO TABS
1.0000 | ORAL_TABLET | Freq: Every day | ORAL | Status: DC
  Administered 2014-10-05 – 2014-10-12 (×8): 1 via ORAL
  Filled 2014-10-05 (×9): qty 1

## 2014-10-05 SURGICAL SUPPLY — 98 items
ADH SKN CLS APL DERMABOND .7 (GAUZE/BANDAGES/DRESSINGS)
APL SKNCLS STERI-STRIP NONHPOA (GAUZE/BANDAGES/DRESSINGS) ×2
BAG SPEC RTRVL LRG 6X4 10 (ENDOMECHANICALS) ×2
BENZOIN TINCTURE PRP APPL 2/3 (GAUZE/BANDAGES/DRESSINGS) ×3 IMPLANT
CANISTER SUCTION 2500CC (MISCELLANEOUS) ×5 IMPLANT
CATH KIT ON Q 5IN SLV (PAIN MANAGEMENT) IMPLANT
CATH MALECOT BARD  28FR (CATHETERS) ×1 IMPLANT
CATH THORACIC 28FR (CATHETERS) ×1 IMPLANT
CATH THORACIC 36FR (CATHETERS) IMPLANT
CATH THORACIC 36FR RT ANG (CATHETERS) IMPLANT
CLIP TI MEDIUM 6 (CLIP) ×3 IMPLANT
CONN ST 1/4X3/8  BEN (MISCELLANEOUS) ×1
CONN ST 1/4X3/8 BEN (MISCELLANEOUS) IMPLANT
CONN Y 3/8X3/8X3/8  BEN (MISCELLANEOUS) ×1
CONN Y 3/8X3/8X3/8 BEN (MISCELLANEOUS) IMPLANT
CONT SPEC 4OZ CLIKSEAL STRL BL (MISCELLANEOUS) ×14 IMPLANT
COVER SURGICAL LIGHT HANDLE (MISCELLANEOUS) ×2 IMPLANT
DERMABOND ADVANCED (GAUZE/BANDAGES/DRESSINGS)
DERMABOND ADVANCED .7 DNX12 (GAUZE/BANDAGES/DRESSINGS) IMPLANT
DRAIN CHANNEL 28F RND 3/8 FF (WOUND CARE) IMPLANT
DRAIN CHANNEL 32F RND 10.7 FF (WOUND CARE) IMPLANT
DRAPE LAPAROSCOPIC ABDOMINAL (DRAPES) ×3 IMPLANT
DRAPE WARM FLUID 44X44 (DRAPE) ×3 IMPLANT
ELECT BLADE 6.5 EXT (BLADE) ×3 IMPLANT
ELECT REM PT RETURN 9FT ADLT (ELECTROSURGICAL) ×3
ELECTRODE REM PT RTRN 9FT ADLT (ELECTROSURGICAL) ×2 IMPLANT
GAUZE SPONGE 4X4 12PLY STRL (GAUZE/BANDAGES/DRESSINGS) ×3 IMPLANT
GLOVE BIO SURGEON STRL SZ 6.5 (GLOVE) ×2 IMPLANT
GLOVE BIOGEL PI IND STRL 7.0 (GLOVE) IMPLANT
GLOVE BIOGEL PI INDICATOR 7.0 (GLOVE) ×2
GLOVE SURG SIGNA 7.5 PF LTX (GLOVE) ×6 IMPLANT
GLOVE SURG SS PI 6.5 STRL IVOR (GLOVE) ×1 IMPLANT
GLOVE SURG SS PI 7.0 STRL IVOR (GLOVE) ×1 IMPLANT
GOWN STRL REUS W/ TWL LRG LVL3 (GOWN DISPOSABLE) ×4 IMPLANT
GOWN STRL REUS W/ TWL XL LVL3 (GOWN DISPOSABLE) ×4 IMPLANT
GOWN STRL REUS W/TWL LRG LVL3 (GOWN DISPOSABLE) ×9
GOWN STRL REUS W/TWL XL LVL3 (GOWN DISPOSABLE) ×3
HANDLE STAPLE ENDO GIA SHORT (STAPLE)
HEMOSTAT SURGICEL 2X14 (HEMOSTASIS) ×1 IMPLANT
KIT BASIN OR (CUSTOM PROCEDURE TRAY) ×3 IMPLANT
KIT ROOM TURNOVER OR (KITS) ×3 IMPLANT
KIT SUCTION CATH 14FR (SUCTIONS) ×3 IMPLANT
LIQUID BAND (GAUZE/BANDAGES/DRESSINGS) ×1 IMPLANT
NS IRRIG 1000ML POUR BTL (IV SOLUTION) ×10 IMPLANT
PACK CHEST (CUSTOM PROCEDURE TRAY) ×3 IMPLANT
PAD ARMBOARD 7.5X6 YLW CONV (MISCELLANEOUS) ×6 IMPLANT
POUCH ENDO CATCH II 15MM (MISCELLANEOUS) ×1 IMPLANT
POUCH SPECIMEN RETRIEVAL 10MM (ENDOMECHANICALS) ×1 IMPLANT
PROBE CRYO2-ABLATION MALLABLE (MISCELLANEOUS) ×1 IMPLANT
RELOAD GOLD (STAPLE) ×8 IMPLANT
RELOAD GREEN (STAPLE) ×4 IMPLANT
RELOAD STAPLE 35X2.5 WHT THIN (STAPLE) IMPLANT
SCISSORS ENDO CVD 5DCS (MISCELLANEOUS) IMPLANT
SEALANT PROGEL (MISCELLANEOUS) ×1 IMPLANT
SEALANT SURG COSEAL 4ML (VASCULAR PRODUCTS) IMPLANT
SEALANT SURG COSEAL 8ML (VASCULAR PRODUCTS) IMPLANT
SOLUTION ANTI FOG 6CC (MISCELLANEOUS) ×4 IMPLANT
SPECIMEN JAR MEDIUM (MISCELLANEOUS) ×3 IMPLANT
SPONGE GAUZE 4X4 12PLY STER LF (GAUZE/BANDAGES/DRESSINGS) ×1 IMPLANT
SPONGE INTESTINAL PEANUT (DISPOSABLE) ×1 IMPLANT
SPONGE TONSIL 1.25 RF SGL STRG (GAUZE/BANDAGES/DRESSINGS) ×1 IMPLANT
STAPLE ECHEON FLEX 60 POW ENDO (STAPLE) ×1 IMPLANT
STAPLE RELOAD 2.5MM WHITE (STAPLE) ×6 IMPLANT
STAPLER ENDO GIA 12 SHRT THIN (STAPLE) IMPLANT
STAPLER ENDO GIA 12MM SHORT (STAPLE) IMPLANT
STAPLER VASCULAR ECHELON 35 (CUTTER) ×1 IMPLANT
SUT PROLENE 4 0 RB 1 (SUTURE)
SUT PROLENE 4-0 RB1 .5 CRCL 36 (SUTURE) IMPLANT
SUT SILK  1 MH (SUTURE) ×2
SUT SILK 1 MH (SUTURE) ×2 IMPLANT
SUT SILK 1 TIES 10X30 (SUTURE) ×3 IMPLANT
SUT SILK 2 0 SH (SUTURE) ×1 IMPLANT
SUT SILK 2 0SH CR/8 30 (SUTURE) IMPLANT
SUT SILK 3 0 SH 30 (SUTURE) IMPLANT
SUT SILK 3 0SH CR/8 30 (SUTURE) IMPLANT
SUT VIC AB 0 CTX 27 (SUTURE) IMPLANT
SUT VIC AB 1 CTX 27 (SUTURE) ×1 IMPLANT
SUT VIC AB 2-0 CT1 27 (SUTURE) ×3
SUT VIC AB 2-0 CT1 TAPERPNT 27 (SUTURE) IMPLANT
SUT VIC AB 2-0 CTX 36 (SUTURE) IMPLANT
SUT VIC AB 3-0 MH 27 (SUTURE) IMPLANT
SUT VIC AB 3-0 SH 27 (SUTURE)
SUT VIC AB 3-0 SH 27X BRD (SUTURE) IMPLANT
SUT VIC AB 3-0 X1 27 (SUTURE) ×5 IMPLANT
SUT VICRYL 0 UR6 27IN ABS (SUTURE) ×4 IMPLANT
SUT VICRYL 2 TP 1 (SUTURE) IMPLANT
SWAB COLLECTION DEVICE MRSA (MISCELLANEOUS) IMPLANT
SYSTEM SAHARA CHEST DRAIN ATS (WOUND CARE) ×3 IMPLANT
TIP APPLICATOR SPRAY EXTEND 16 (VASCULAR PRODUCTS) ×1 IMPLANT
TOWEL OR 17X24 6PK STRL BLUE (TOWEL DISPOSABLE) ×3 IMPLANT
TOWEL OR 17X26 10 PK STRL BLUE (TOWEL DISPOSABLE) ×6 IMPLANT
TRAP SPECIMEN MUCOUS 40CC (MISCELLANEOUS) IMPLANT
TRAY FOLEY CATH 16FRSI W/METER (SET/KITS/TRAYS/PACK) ×3 IMPLANT
TROCAR XCEL BLADELESS 5X75MML (TROCAR) ×3 IMPLANT
TROCAR XCEL NON-BLD 5MMX100MML (ENDOMECHANICALS) IMPLANT
TUBE ANAEROBIC SPECIMEN COL (MISCELLANEOUS) IMPLANT
TUNNELER SHEATH ON-Q 11GX8 DSP (PAIN MANAGEMENT) IMPLANT
WATER STERILE IRR 1000ML POUR (IV SOLUTION) ×5 IMPLANT

## 2014-10-05 NOTE — Brief Op Note (Addendum)
10/05/2014  10:56 AM  PATIENT:  Kelli [name]  69 y.o. female  PRE-OPERATIVE DIAGNOSIS:  Right Lower Lobe Nodule  POST-OPERATIVE DIAGNOSIS:  Rght lower lobe lung cancer- clinical stage IB  PROCEDURE:  RIGHT VIDEO ASSISTED THORACOSCOPY (VATS) RIGHT LOWER LOBE WEDGE RESECTION RLL BASILAR SEGMENTECTOMY MEDIASTINAL LYMPH NODE DISSECTION CRYO INTERCOSTAL NERVE BLOCK levels 4-7  SURGEON:  Surgeon(s) and Role:    * [name], MD - Primary  PHYSICIAN ASSISTANT: Lars Pinks PA-C  ANESTHESIA:   general  EBL:  Total I/O In: 0141 [I.V.:1400; IV Piggyback:250] Out: 320 [Urine:120; Blood:200]  BLOOD ADMINISTERED:none  DRAINS: 105 Blake and 10 French chest tube placed in the right pleural space   SPECIMEN:  Source of Specimen:  Wedge RLL, multiple lymph nodes, and RLL basilar segmentectomy. Wedge RLL positive for cancer.  DISPOSITION OF SPECIMEN:  PATHOLOGY  COUNTS CORRECT: Yes  PLAN OF CARE: Admit to inpatient   PATIENT DISPOSITION:  PACU - hemodynamically stable.   Delay start of Pharmacological VTE agent (>24hrs) due to surgical blood loss or risk of bleeding: yes  Findings: 2 cm mass right lower lobe. Frozen  adenocarcinoma in situ  Dictation # [id number]

## 2014-10-05 NOTE — Anesthesia Preprocedure Evaluation (Addendum)
Anesthesia Evaluation  Patient identified by MRN, date of birth, ID band Patient awake    Reviewed: Allergy & Precautions, NPO status , Patient's Chart, lab work & pertinent test results  History of Anesthesia Complications Negative for: history of anesthetic complications  Airway Mallampati: II  TM Distance: >3 FB Neck ROM: Full    Dental  (+) Lower Dentures, Upper Dentures, Dental Advisory Given   Pulmonary neg shortness of breath, neg COPDformer smoker,  breath sounds clear to auscultation        Cardiovascular negative cardio ROS  Rhythm:Regular Rate:Normal     Neuro/Psych Anxiety Depression negative neurological ROS     GI/Hepatic negative GI ROS, Neg liver ROS,   Endo/Other  negative endocrine ROS  Renal/GU negative Renal ROS     Musculoskeletal   Abdominal Normal abdominal exam  (+)   Peds  Hematology negative hematology ROS (+)   Anesthesia Other Findings   Reproductive/Obstetrics                            Anesthesia Physical Anesthesia Plan  ASA: II  Anesthesia Plan: General   Post-op Pain Management:    Induction: Intravenous  Airway Management Planned: Double Lumen EBT  Additional Equipment: Arterial line and CVP  Intra-op Plan:   Post-operative Plan: Extubation in OR and Post-operative intubation/ventilation  Informed Consent: I have reviewed the patients History and Physical, chart, labs and discussed the procedure including the risks, benefits and alternatives for the proposed anesthesia with the patient or authorized representative who has indicated his/her understanding and acceptance.   Dental advisory given  Plan Discussed with: Surgeon and CRNA  Anesthesia Plan Comments:        Anesthesia Quick Evaluation

## 2014-10-05 NOTE — Progress Notes (Signed)
Pt arrived from PACU: VSS, Chest Tube to suction, PCA button in hand, call bell within reach, oriented to unit and routine. Sister at bedside as well. Provided ice chips and ice water. Notified CMT and elink of arrival. Will continue to monitor.

## 2014-10-05 NOTE — Interval H&P Note (Signed)
History and Physical Interval Note:  10/05/2014 7:19 AM  Kelli [name]  has presented today for surgery, with the diagnosis of RLL NODULE  The various methods of treatment have been discussed with the patient and family. After consideration of risks, benefits and other options for treatment, the patient has consented to  Procedure(s): VIDEO ASSISTED THORACOSCOPY (VATS)/WEDGE RESECTION (Right) POSSIBLE SEGMENTECTOMY (Right) CRYO INTERCOSTAL NERVE BLOCK (N/A) as a surgical intervention .  The patient's history has been reviewed, patient examined, no change in status, stable for surgery.  I have reviewed the patient's chart and labs.  Questions were answered to the patient's satisfaction.     [name]

## 2014-10-05 NOTE — Transfer of Care (Signed)
Immediate Anesthesia Transfer of Care Note  Patient: Kelli [name]  Procedure(s) Performed: Procedure(s): Right VIDEO ASSISTED THORACOSCOPY (VATS) with right lower lobe lung nodule WEDGE RESECTION (Right) right lower lobe SEGMENTECTOMY (Right) CRYO INTERCOSTAL NERVE BLOCK (N/A) LYMPH NODE DISSECTION (Right)  Patient Location: PACU  Anesthesia Type:General  Level of Consciousness: awake, patient cooperative and responds to stimulation  Airway & Oxygen Therapy: Patient Spontanous Breathing and Patient connected to face mask oxygen  Post-op Assessment: Report given to RN and Post -op Vital signs reviewed and stable  Post vital signs: Reviewed and stable  Last Vitals:  Filed Vitals:   10/05/14 0545  BP: 119/48  Pulse: 76  Temp: 36.8 C  Resp: 20    Complications: No apparent anesthesia complications

## 2014-10-05 NOTE — Anesthesia Procedure Notes (Addendum)
Anesthesia Procedure Note CVP: Timeout, sterile prep, drape, FBP R neck.  Trendelenburg position.  1% lido local, finder and trocar RIJ 1st pass with US guidance.  2 lumen placed over J wire. Biopatch and sterile dressing on.  Patient tolerated well.  VSS.  Jenita Seashore, MD  06:47-07:00 Procedure Name: Intubation Performed by: Verdie Drown Pre-anesthesia Checklist: Patient identified, Emergency Drugs available, Suction available, Patient being monitored and Timeout performed Patient Re-evaluated:Patient Re-evaluated prior to inductionOxygen Delivery Method: Circle system utilized Preoxygenation: Pre-oxygenation with 100% oxygen Intubation Type: IV induction Ventilation: Mask ventilation without difficulty Laryngoscope Size: Mac and 3 Grade View: Grade II Tube type: Oral Endobronchial tube: Left and 37 Fr Number of attempts: 1 Airway Equipment and Method: Fiberoptic brochoscope Placement Confirmation: ETT inserted through vocal cords under direct vision,  breath sounds checked- equal and bilateral,  positive ETCO2 and CO2 detector Tube secured with: Tape Dental Injury: Teeth and Oropharynx as per pre-operative assessment

## 2014-10-05 NOTE — Telephone Encounter (Signed)
Requested IHC testing on colon path.

## 2014-10-05 NOTE — Anesthesia Postprocedure Evaluation (Signed)
  Anesthesia Post-op Note  Patient: Kelli [name]  Procedure(s) Performed: Procedure(s): Right VIDEO ASSISTED THORACOSCOPY (VATS) with right lower lobe lung nodule WEDGE RESECTION (Right) right lower lobe SEGMENTECTOMY (Right) CRYO INTERCOSTAL NERVE BLOCK (N/A) LYMPH NODE DISSECTION (Right)  Patient Location: PACU  Anesthesia Type:General  Level of Consciousness: awake  Airway and Oxygen Therapy: Patient Spontanous Breathing and Patient connected to nasal cannula oxygen  Post-op Pain: moderate  Post-op Assessment: Post-op Vital signs reviewed, Patient's Cardiovascular Status Stable, Respiratory Function Stable, Patent Airway, No signs of Nausea or vomiting and Pain level controlled  Post-op Vital Signs: Reviewed and stable  Last Vitals:  Filed Vitals:   10/05/14 1347  BP: 123/60  Pulse: 81  Temp: 36.6 C  Resp: 11    Complications: No apparent anesthesia complications

## 2014-10-05 NOTE — H&P (View-Only) (Signed)
PCP is Ellsworth Lennox, MD Referring Provider is [name], MD  Chief Complaint  Patient presents with  . Lung Lesion    surgical eval,  PET Scan 09/17/14, Chest CT 09/03/14    HPI: 69 year old woman sent for consultation regarding a right lower lobe lung nodule.  Kelli [name] is a 69 year old woman with a history of colon cancer and tobacco abuse. She had colon cancer in 2010 and has had no evidence of recurrence since that time. She smoked over 2 packs per day for about 40 years before quitting in 2010. She has no history of heart disease or COPD.  She had been followed by [facility] for many years. She recently established care with Dr. Julieanne Manson. She was given a clean bill of health as to her: Cancer. However given her smoking history he recommended a screening CT. She had had CTs previously for colon cancer have multiple lung nodules. These have been stable for the most part except for one in the right lower lobe that have been slowly growing.  On the screening CT the right lower lobe nodule was noted to a point again increased in size. A PET CT was done and it showed the lesion was mildly hypermetabolic.  She denies headaches, visual changes, fevers, chills, sweats, cough, hemoptysis, wheezing, shortness of breath, chest pain, pressure, or tightness, change in appetite, or weight loss.  Zubrod Score: At the time of surgery this patient's most appropriate activity status/level should be described as: '[x]'$     0    Normal activity, no symptoms '[]'$     1    Restricted in physical strenuous activity but ambulatory, able to do out light work '[]'$     2    Ambulatory and capable of self care, unable to do work activities, up and about >50 % of waking hours                              '[]'$     3    Only limited self care, in bed greater than 50% of waking hours '[]'$     4    Completely disabled, no self care, confined to bed or chair '[]'$     5    Moribund    Past Medical History  Diagnosis  Date  . H/O colon cancer, stage II 09/19/2011    T3N0  Cecum Sept 2010  . Lung nodules 09/19/2011    granulomas  . Depression with anxiety 09/19/2011  . Hyperlipidemia 09/19/2011  . Colon cancer     colon ca dx 02/2009    Past Surgical History  Procedure Laterality Date  . Breast surgery    . Colon surgery      2010    Family History  Problem Relation Age of Onset  . Heart disease Mother   . Heart disease Brother     Social History History  Substance Use Topics  . Smoking status: Former Smoker -- 3.00 packs/day for 40 years    Types: Cigarettes    Quit date: 02/06/2009  . Smokeless tobacco: Never Used  . Alcohol Use: 0.0 oz/week    0 Standard drinks or equivalent per week     Comment: 3-4 times a week 1-2 beers    Current Outpatient Prescriptions  Medication Sig Dispense Refill  . aspirin 81 MG tablet Take 81 mg by mouth daily.    [name] b complex vitamins tablet Take 1 tablet by mouth daily.    [name]  cholecalciferol (VITAMIN D) 1000 UNITS tablet Take 2,000 Units by mouth daily.    [name] FLUoxetine (PROZAC) 20 MG capsule Take 2 capsules (40 mg total) by mouth daily. 180 capsule 1  . pravastatin (PRAVACHOL) 40 MG tablet Take 1 tablet (40 mg total) by mouth daily. 90 tablet 3   No current facility-administered medications for this visit.    No Known Allergies  Review of Systems  Constitutional: Negative for fever, chills, activity change, appetite change and unexpected weight change.       Tired  Eyes: Negative for visual disturbance.  Respiratory: Negative for cough, chest tightness, shortness of breath and wheezing.   Cardiovascular: Negative for chest pain, palpitations and leg swelling.  Gastrointestinal: Negative for nausea, vomiting, diarrhea and blood in stool.  Genitourinary: Negative.   Musculoskeletal: Positive for arthralgias (index finger).  Neurological: Negative for seizures, weakness and headaches.  Hematological: Negative for adenopathy. Does not bruise/bleed  easily.    BP 101/62 mmHg  Pulse 69  Resp 20  Ht '5\' 9"'$  (1.753 m)  Wt 178 lb (80.74 kg)  BMI 26.27 kg/m2  SpO2 97% Physical Exam  Constitutional: She is oriented to person, place, and time. She appears well-developed and well-nourished. No distress.  HENT:  Head: Normocephalic and atraumatic.  Eyes: EOM are normal. Pupils are equal, round, and reactive to light.  Neck: No thyromegaly present.  Cardiovascular: Normal rate, regular rhythm, normal heart sounds and intact distal pulses.  Exam reveals no gallop.   No murmur heard. Pulmonary/Chest: Effort normal and breath sounds normal. She has no wheezes. She has no rales.  Abdominal: Soft. There is no tenderness.  Musculoskeletal: She exhibits no edema.  Lymphadenopathy:    She has no cervical adenopathy.  Neurological: She is alert and oriented to person, place, and time. No cranial nerve deficit.  Skin: Skin is warm and dry.  Vitals reviewed.    Diagnostic Tests: CT scan and PET CT images and reports reviewed and also reviewed with the patient.  CT CHEST WITHOUT CONTRAST  TECHNIQUE: Multidetector CT imaging of the chest was performed following the standard protocol without IV contrast.  COMPARISON: Standard chest CT 09/12/2011, 03/14/2011, 09/12/2010, 03/14/2010 and 02/11/2009. PET 02/12/2009.  FINDINGS: Mediastinum/Nodes: No pathologically enlarged mediastinal or axillary lymph nodes. Hilar regions are difficult to definitively evaluate without IV contrast but appear grossly unremarkable. Heart size normal. No pericardial effusion.  Lungs/Pleura: A spiculated nodule in the lateral right lower lobe measures 13 x 17 mm (series 4, image 208), previously 12 x 14 mm on 03/14/2011. It has spiculated margins and tags to the adjacent pleura. Ground-glass nodule in the left upper lobe measures 7 mm (series 4, image 138), faintly seen on the prior study and likely better seen on today's study due to thinner slice  collimation. Multiple additional scattered smaller pulmonary nodules measure up to 5 mm in the right lower lobe (image 139), stable. No pleural fluid. Airway is unremarkable.  Upper abdomen: Liver appears slightly heterogeneous in decreased attenuation. 6 mm low-attenuation lesion in the left hepatic lobe is unchanged, favoring a benign lesion such as a cyst. Visualized portions of the liver, gallbladder, adrenal glands, kidneys, spleen, pancreas, stomach and bowel are otherwise grossly unremarkable. No upper abdominal adenopathy.  Musculoskeletal: A 2.0 x 2.8 cm fluid density lesion is seen deep to the skin surface, inferior to the left breast, minimally larger than on 02/11/2009, at which time it measured 1.6 x 2.6 cm. A sebaceous cyst is most likely. No worrisome lytic  or sclerotic lesions.  IMPRESSION: 1. A spiculated right lower lobe nodule shows indolent growth and is worrisome for adenocarcinoma. Lung-RADS Category 4B, suspicious. Consultation with Pulmonary Medicine or thoracic surgery, and subsequent PET, recommended. These results will be called to the ordering clinician or representative by the Radiologist Assistant, and communication documented in the PACS or zVision Dashboard. 2. Additional scattered pulmonary nodules, as described above, likely stable given differences in slice collimation. Continued attention on followup exams is warranted. 3. Liver may be fatty.   Electronically Signed  By: Lorin Picket M.D.  On: 09/03/2014 16:12 NUCLEAR MEDICINE PET SKULL BASE TO THIGH  TECHNIQUE: 8 point a mCi F-18 FDG was injected intravenously. Full-ring PET imaging was performed from the skull base to thigh after the radiotracer. CT data was obtained and used for attenuation correction and anatomic localization.  FASTING BLOOD GLUCOSE: Value: 140 mg/dl  COMPARISON: CT chest from 09/03/2014. PET-CT from 02/12/2009.  FINDINGS: NECK  No  hypermetabolic lymph nodes in the neck.  CHEST  The dominant right lower lobe pulmonary lesions seen on the recent screening CT scan shows low level FDG uptake with SUV max = 1.5. No definite FDG accumulation can be seen in the other pulmonary nodules, but these are all tiny and below the accepted size threshold for reliable resolution on PET imaging.  ABDOMEN/PELVIS  2.6 cm soft tissue nodule in the subcutaneous fat of the upper left anterior abdominal wall shows no hypermetabolism.  No other hypermetabolic disease is identified in the abdomen or pelvis.  Atherosclerotic calcification is seen in the abdominal aorta without aneurysm.  SKELETON  No focal hypermetabolic activity to suggest skeletal metastasis.  IMPRESSION: 17 mm spiculated nodule in the lateral right lower lobe shows low level FDG uptake. This nodule was documented to have progressed over multiple CT scans, a finding concerning for neoplasm. The low level uptake today may be related to a low-grade or poorly differentiated neoplasm.   Electronically Signed  By: Misty Stanley M.D.  On: 09/16/2014 16:09  Pulmonary function tests  FVC 2.92 (77%) FEV1 2.04 (71%) FEV1 postbronchodilator 2.22 (77%) DLCO 49%  Impression: 69 year old woman with a history of tobacco abuse and colon cancer (2010). She has a very slowly enlarging right lower lobe nodule by CT scan. On PET CT this nodule is mildly hypermetabolic with an SUV max of 1.5. This lesion is suspicious for a low-grade neoplasm such as an adenocarcinoma with lepidic growth or a carcinoid tumor.  I discussed 3 separate options with Mrs. Rieman. I reviewed the advantages and disadvantages of each approach. Option one was to continue to follow this radiographically. The second possibility would be to do a CT-guided needle biopsy and then treat based on that result. The final option would be to go ahead and do a wedge resection of this nodule for  definitive diagnosis and treatment.  After discussion of the options, Ms. Jahr wishes to proceed with a wedge resection and possible segmentectomy if necessary.  I described the proposed operation in detail. We will be planning to do a right VATS, wedge resection of right lower lobe nodule, possible segmentectomy, cryoablation of intercostal nerves. We discussed the general nature of the surgery, the incisions to be used, the need for general anesthesia, the decision making, expected hospital stay, and overall recovery. I reviewed the indications, risks, benefits, and alternatives. She understands the risks include but are not limited to death, MI, DVT, PE, bleeding, possible need for transfusion, stroke, infection, air leak, irregular heart rhythms, as  well as the possibility of unforeseeable, occasions.  She understands and accepts the risk and agrees to proceed.  Plan: Right VATS, wedge resection, possible segmentectomy, Crile analgesia of intercostal nerves on Monday, 10/05/2014  [name], MD [facility] [phone]

## 2014-10-06 ENCOUNTER — Inpatient Hospital Stay (HOSPITAL_COMMUNITY): Payer: Commercial Managed Care - HMO

## 2014-10-06 ENCOUNTER — Encounter: Payer: Self-pay | Admitting: *Deleted

## 2014-10-06 ENCOUNTER — Encounter (HOSPITAL_COMMUNITY): Payer: Self-pay | Admitting: Thoracic Surgery (Cardiothoracic Vascular Surgery)

## 2014-10-06 LAB — CBC
HCT: 29.8 % — ABNORMAL LOW (ref 36.0–46.0)
Hemoglobin: 10 g/dL — ABNORMAL LOW (ref 12.0–15.0)
MCH: 30.8 pg (ref 26.0–34.0)
MCHC: 33.6 g/dL (ref 30.0–36.0)
MCV: 91.7 fL (ref 78.0–100.0)
PLATELETS: 248 10*3/uL (ref 150–400)
RBC: 3.25 MIL/uL — AB (ref 3.87–5.11)
RDW: 12.9 % (ref 11.5–15.5)
WBC: 12.5 10*3/uL — ABNORMAL HIGH (ref 4.0–10.5)

## 2014-10-06 LAB — GLUCOSE, CAPILLARY
GLUCOSE-CAPILLARY: 167 mg/dL — AB (ref 70–99)
Glucose-Capillary: 127 mg/dL — ABNORMAL HIGH (ref 70–99)
Glucose-Capillary: 171 mg/dL — ABNORMAL HIGH (ref 70–99)
Glucose-Capillary: 130 mg/dL — ABNORMAL HIGH (ref 70–99)

## 2014-10-06 LAB — BLOOD GAS, ARTERIAL
Acid-Base Excess: 0.3 mmol/L (ref 0.0–2.0)
Bicarbonate: 24.9 mEq/L — ABNORMAL HIGH (ref 20.0–24.0)
DRAWN BY: 405301
O2 Content: 3 L/min
O2 SAT: 97.1 %
PH ART: 7.373 (ref 7.350–7.450)
Patient temperature: 98.6
TCO2: 26.2 mmol/L (ref 0–100)
pCO2 arterial: 43.8 mmHg (ref 35.0–45.0)
pO2, Arterial: 95.8 mmHg (ref 80.0–100.0)

## 2014-10-06 LAB — BASIC METABOLIC PANEL
ANION GAP: 9 (ref 5–15)
BUN: 9 mg/dL (ref 6–20)
CALCIUM: 8.1 mg/dL — AB (ref 8.9–10.3)
CHLORIDE: 102 mmol/L (ref 101–111)
CO2: 24 mmol/L (ref 22–32)
Creatinine, Ser: 0.58 mg/dL (ref 0.44–1.00)
GFR calc Af Amer: >60 mL/min (ref >60)
GFR calc non Af Amer: >60 mL/min (ref >60)
GLUCOSE: 132 mg/dL — AB (ref 70–99)
POTASSIUM: 3.8 mmol/L (ref 3.5–5.1)
SODIUM: 135 mmol/L (ref 135–145)

## 2014-10-06 MED ORDER — HYDROCODONE-ACETAMINOPHEN 5-325 MG PO TABS
1.0000 | ORAL_TABLET | Freq: Four times a day (QID) | ORAL | Status: DC | PRN
  Administered 2014-10-06: 2 via ORAL
  Administered 2014-10-06: 1 via ORAL
  Administered 2014-10-07 – 2014-10-13 (×14): 2 via ORAL
  Filled 2014-10-06 (×2): qty 2
  Filled 2014-10-06: qty 1
  Filled 2014-10-06 (×13): qty 2

## 2014-10-06 MED ORDER — ENOXAPARIN SODIUM 40 MG/0.4ML ~~LOC~~ SOLN
40.0000 mg | SUBCUTANEOUS | Status: DC
  Administered 2014-10-06 – 2014-10-13 (×8): 40 mg via SUBCUTANEOUS
  Filled 2014-10-06 (×9): qty 0.4

## 2014-10-06 MED ORDER — PHENOL 1.4 % MT LIQD
1.0000 | OROMUCOSAL | Status: DC | PRN
  Administered 2014-10-06: 1 via OROMUCOSAL
  Filled 2014-10-06: qty 177

## 2014-10-06 NOTE — Progress Notes (Signed)
UR COMPLETED  

## 2014-10-06 NOTE — Progress Notes (Signed)
Pt refused to have foley removed. Ask that it stay in until the morning.

## 2014-10-06 NOTE — Op Note (Signed)
NAMEHALINA, ASANO                ACCOUNT NO.:  [id number]  MEDICAL RECORD NO.:  [id number]  LOCATION:  3S13C                        FACILITY:  [facility]  PHYSICIAN:  Revonda Standard. Roxan Hockey, M.D.DATE OF BIRTH:  1946/05/24  DATE OF PROCEDURE:  10/05/2014 DATE OF DISCHARGE:                              OPERATIVE REPORT   PREOPERATIVE DIAGNOSIS:  Right lower lobe lung nodule.  POSTOPERATIVE DIAGNOSIS:  Non-small cell carcinoma right lower lobe, clinical stage 1b.  PROCEDURE:  ] Right video-assisted thoracoscopy Wedge resection right lower lobe Right lower lobe basilar segmentectomy Mediastinal lymph node dissection Cryoanalgesia of intercostal nerves 4 through 7.  SURGEON:  Revonda Standard. Roxan Hockey, M.D.  ASSISTANT:  Lars Pinks, PA-C  ANESTHESIA:  General.  FINDINGS:  Approximately 2 cm nodule in the lateral aspect of the right lower lobe with invagination of the visceral pleura.  Frozen section revealed carcinoma in situ.  CLINICAL NOTE:  Ms. Siebers is a 69 year old woman with a history of colon cancer and tobacco abuse.  She has had no evidence of recurrence of her colon cancer.  She recently had a CT of the chest for lung cancer screening due to her smoking history.  There was a right lower lobe nodule that had increased in size from a previous CT.  On PET, the lesion was mildly hypermetabolic.  The patient was advised to undergo right video-assisted thoracoscopy for wedge resection and possible segmentectomy depending on intraoperative findings.  We also discussed the use of cryoablation of the intercostal nerve for postoperative pain control.  The indications, risks, benefits, and alternatives were discussed in detail with the patient.  She understood and accepted the risks and agreed to proceed.  OPERATIVE NOTE:  Ms. Sinyard was brought to the preoperative holding area on Oct 05, 2014, Anesthesia placed a central line and arterial blood pressure monitoring line.  She  was taken to the operating room, anesthetized, and intubated with a double-lumen endotracheal tube.  A Foley catheter was placed.  Sequential compressive devices were placed on the calves for DVT prophylaxis.  Intravenous antibiotics were administered.  She was placed in a left lateral decubitus position and the right chest was prepped and draped in usual sterile fashion.  Single lung ventilation of the left lung was initiated and was tolerated well throughout the procedure.  An incision was made in the seventh intercostal space in the midaxillary line.  A 5 mm port was inserted into the chest.  The thoracoscope was advanced.  There was good isolation of the right lung.  A small working incision was made in the fourth interspace anterolaterally.  This was 5 cm in length.  No rib spreading was performed during the procedure.  The right lower lobe was inspected.  On the lateral aspect of the right lower lobe, there was a palpable nodule with invagination of the visceral pleura suspicious for a stage IB lung cancer.  A wedge resection was performed with sequential firings of a 60 mm endoscopic stapler.  The specimen was placed into an endoscopic retrieval bag and removed and sent for frozen section.  While awaiting the results of the frozen section, cryoablation of the intercostal nerves at levels 4 through  7 was performed.  At each level, a 2 cm segment of the cryoprobe was placed over the nerve.  It was taken to -70 degrees Celsius for 2 minutes.  The frozen section returned showing non-small cell carcinoma in situ. There was no definite invasive cancer seen on the frozen. However, given the appearance clinically and grossly this was felt to possibly be a T2 lesion and decision was made to proceed with the segmentectomy as discussed with the patient preoperatively.  The inferior ligament was divided with cautery.  A level 9 lymph node was taken and sent for permanent pathology.  All  lymph nodes that were encountered during the dissection were sent for permanent pathology. The pleural reflection was divided at the hilum anteriorly and posteriorly.  The inferior pulmonary vein was identified.  The superior segmental branch was identified and preserved.  The remainder of the inferior vein was encircled and divided with endoscopic vascular stapler.  The fissure between the middle and lower lobes was completed with 2 firings of the Endo-GIA stapler.  The basilar segmental pulmonary artery branch then was visualized.  This was dissected out.  Additional nodes were taken. The dissection was carried into the confluence of the major and minor fissures far enough to identify the superior segmental branch to make sure that it was preserved.  The basilar segmental branches then were encircled and divided with an endoscopic stapler. The basilar segmental bronchus then was encircled preserving the superior segmental bronchus.  A stapler was placed across it and closed.  A test inflation showed good aeration in the superior segment as well the upper and middle lobes.  The stapler was fired transecting the basilar segmental bronchus.  The segmentectomy was completed with sequential firings of the stapler through the parenchyma. The basilar segments were placed into an endoscopic retrieval bag and removed and sent for permanent pathology.  The subcarinal nodes then were dissected out.  These were mildly enlarged, but otherwise benign-appearing. The pleura overlying the mediastinum and just above the azygos node then was incised and the level 4R nodes were taken as well.  The chest was filled with warm saline.  A test inflation to 30 cm of water revealed no air leakage from the bronchial stump.  There was minimal parenchymal air leakage.  The fluid was evacuated.  Final inspection was made for hemostasis.  A 28- [place] Blake drain was placed through the original port incision  and directed posteriorly.  A 28-French chest tube was placed through a separate stab incision and directed anteriorly.  They were secured at the skin.  The remainder of the right lung was then reinflated and there was good aeration of all segments.  The incision was closed with a #1 Vicryl fascial suture followed by 2-0 Vicryl subcutaneous suture and 3-0 Vicryl subcuticular suture.  The chest tubes were placed to suction. The patient was placed back in a supine position.  She was extubated in the operating room and taken to the postanesthetic care unit in good condition.     Revonda Standard Roxan Hockey, M.D.     SCH/MEDQ  D:  10/05/2014  T:  10/06/2014  Job:  [id number]

## 2014-10-06 NOTE — [facility] Oncology Navigator Note (Signed)
Requested IHC testing on following path per Dr. Gearldine Shown request: Case #: [id number] Patient Name: Kelli [name], Kelli [name] Office Chart Number: N/A  MRN: [id number] Pathologist: Jaquelyn Bitter A. Rodney Cruise, MD DOB/Age [birth year] (Age: 46) Gender: F Date Taken: 02/15/2009 Date Received: 02/16/2009

## 2014-10-06 NOTE — Progress Notes (Signed)
[facility] DAILY ICU PROGRESS NOTE                   [address]            [place] [address]          [phone]   1 Day Post-Op Procedure(s) (LRB): Right VIDEO ASSISTED THORACOSCOPY (VATS) with right lower lobe lung nodule WEDGE RESECTION (Right) right lower lobe SEGMENTECTOMY (Right) CRYO INTERCOSTAL NERVE BLOCK (N/A) LYMPH NODE DISSECTION (Right)  Total Length of Stay:  LOS: 1 day   Subjective: Sore throat, otherwise ok  Objective: Vital signs in last 24 hours: Temp:  [97.2 F (36.2 C)-98.6 F (37 C)] 98.6 F (37 C) (05/03 0253) Pulse Rate:  [73-84] 73 (05/03 0253) Cardiac Rhythm:  [-] Normal sinus rhythm (05/03 0253) Resp:  [9-22] 16 (05/03 0400) BP: (104-138)/(56-65) 120/65 mmHg (05/03 0253) SpO2:  [95 %-100 %] 98 % (05/03 0400) Arterial Line BP: (113-158)/(52-68) 131/64 mmHg (05/03 0253)  Filed Weights   10/05/14 0545  Weight: 176 lb (79.833 kg)    Weight change:    Hemodynamic parameters for last 24 hours:    Intake/Output from previous day: 05/02 0701 - 05/03 0700 In: 2238.3 [P.O.:120; I.V.:1818.3; IV Piggyback:300] Out: 1780 [Urine:970; Blood:200; Chest Tube:610]  Intake/Output this shift:    Current Meds: Scheduled Meds: . acetaminophen  1,000 mg Oral 4 times per day   Or  . acetaminophen (TYLENOL) oral liquid 160 mg/5 mL  1,000 mg Oral 4 times per day  . aspirin EC  81 mg Oral Daily  . bisacodyl  10 mg Oral Daily  . fentaNYL   Intravenous 6 times per day  . FLUoxetine  40 mg Oral Daily  . insulin aspart  0-24 Units Subcutaneous 4 times per day  . pantoprazole  40 mg Oral Daily  . pravastatin  40 mg Oral q1800  . senna-docusate  1 tablet Oral QHS   Continuous Infusions: . dextrose 5 % and 0.9 % NaCl with KCl 20 mEq/L 100 mL/hr at 10/06/14 0007   PRN Meds:.diphenhydrAMINE **OR** diphenhydrAMINE, naloxone **AND** sodium chloride, ondansetron (ZOFRAN) IV, oxyCODONE-acetaminophen, potassium chloride, traMADol  General  appearance: alert, cooperative and no distress Heart: regular rate and rhythm Lungs: dim in bases Abdomen: benign Extremities: no edema Wound: dressings saturated with serosang fluid  Lab Results: CBC: Recent Labs  10/06/14 0500  WBC 12.5*  HGB 10.0*  HCT 29.8*  PLT 248   BMET:  Recent Labs  10/06/14 0500  NA 135  K 3.8  CL 102  CO2 24  GLUCOSE 132*  BUN 9  CREATININE 0.58  CALCIUM 8.1*    PT/INR: No results for input(s): LABPROT, INR in the last 72 hours. Radiology: Dg Chest 2 View  10/05/2014   CLINICAL DATA:  Right lung nodule  EXAM: CHEST  2 VIEW  COMPARISON:  [facility] 09/16/2014  FINDINGS: The spiculated right lower lobe nodule is not well seen on radiography but may be faintly visible on the lateral view. The lungs are otherwise clear. There are no effusions. Hilar, mediastinal and cardiac contours appear unremarkable. Pulmonary vasculature is normal.  IMPRESSION: Poorly seen right lower lobe nodule. No acute cardiopulmonary findings.   Electronically Signed   By: Andreas Newport M.D.   On: 10/05/2014 06:15   Dg Chest Port 1 View  10/05/2014   CLINICAL DATA:  69 year old female status post VATS for right lower lobe wedge resection.  EXAM: PORTABLE CHEST - 1 VIEW  COMPARISON:  Chest x-ray 10/05/2014.  FINDINGS: There is a right-sided internal jugular central venous catheter with tip terminating in the distal superior vena cava. 2 right-sided chest tubes in position with tips in the medial aspect of the upper right hemithorax. No appreciable right-sided pneumothorax is identified at this time. Lung volumes are low. There are bibasilar opacities (right greater than left), favored to predominantly reflect subsegmental atelectasis and mild postoperative changes in the right lower lobe. No acute consolidative airspace disease. No pleural effusions. No evidence of pulmonary edema. Heart size is upper limits of normal. Upper mediastinal contours are within normal limits. Atherosclerosis  in the thoracic aorta. Bilateral breast implants with capsular calcification incidentally noted.  IMPRESSION: 1. Support apparatus, as above. 2. No pneumothorax. 3. Low lung volumes with probable bibasilar subsegmental atelectasis and resolving postoperative changes in the right lower lobe. 4. Atherosclerosis.   Electronically Signed   By: Vinnie Langton M.D.   On: 10/05/2014 11:42     Assessment/Plan: S/P Procedure(s) (LRB): Right VIDEO ASSISTED THORACOSCOPY (VATS) with right lower lobe lung nodule WEDGE RESECTION (Right) right lower lobe SEGMENTECTOMY (Right) CRYO INTERCOSTAL NERVE BLOCK (N/A) LYMPH NODE DISSECTION (Right)  1 doing well 2 hemodyn stable in sinus rhythm 3 place CT's to H20 seal- no air leak, mod drainage(serosang) 4 mod ABL anemia- monitor, mild leukocytosis- monitor 5 reduce IVF 6 Cont pca 7 d/c foley and aline 8 lovenox dvt prophylaxis 9 throat spray     Kelli [name] E 10/06/2014 8:17 AM

## 2014-10-06 NOTE — Care Management Note (Deleted)
Case Management Note  Patient Details  Name: Kelli [name] MRN: [id number] Date of Birth: September 13, 1945  Subjective/Objective:                    Action/Plan:   Expected Discharge Date:  10/08/14               Expected Discharge Plan:  Home/Self Care  In-House Referral:     Discharge planning Services  CM Consult  Post Acute Care Choice:    Choice offered to:     DME Arranged:    DME Agency:     HH Arranged:    [place] Agency:     Status of Service:     Medicare Important Message Given:    Date Medicare IM Given:    Medicare IM give by:    Date Additional Medicare IM Given:    Additional Medicare Important Message give by:     If discussed at [place] of Stay Meetings, dates discussed:    Additional Comments:  Sharin Mons, RN 10/06/2014, 5:15 PM

## 2014-10-06 NOTE — Care Management Note (Signed)
Case Management Note  Patient Details  Name: Kelli [name] MRN: [id number] Date of Birth: 03/22/1946  Subjective/Objective:                 From home admitted with lung nodule, s/p VAT'S and resection.  Action/Plan: Return to ome when medically stable. CM to f/u with d/c needs.  Expected Discharge Date:  10/08/14               Expected Discharge Plan:  Home/Self Care  In-House Referral:     Discharge planning Services  CM Consult  Post Acute Care Choice:    Choice offered to:     DME Arranged:    DME Agency:     HH Arranged:    HH Agency:     Status of Service:     Medicare Important Message Given:    Date Medicare IM Given:    Medicare IM give by:    Date Additional Medicare IM Given:    Additional Medicare Important Message give by:     If discussed at [place] of Stay Meetings, dates discussed:    Additional Comments:  Sharin Mons, [place] 10/06/2014, 5:12 PM

## 2014-10-06 NOTE — Progress Notes (Signed)
Pt refuses to ambulate.  

## 2014-10-07 ENCOUNTER — Inpatient Hospital Stay (HOSPITAL_COMMUNITY): Payer: Commercial Managed Care - HMO

## 2014-10-07 LAB — COMPREHENSIVE METABOLIC PANEL
ALBUMIN: 3 g/dL — AB (ref 3.5–5.0)
ALT: 35 U/L (ref 14–54)
AST: 28 U/L (ref 15–41)
Alkaline Phosphatase: 61 U/L (ref 38–126)
Anion gap: 7 (ref 5–15)
BILIRUBIN TOTAL: 0.6 mg/dL (ref 0.3–1.2)
BUN: 5 mg/dL — ABNORMAL LOW (ref 6–20)
CO2: 27 mmol/L (ref 22–32)
CREATININE: 0.58 mg/dL (ref 0.44–1.00)
Calcium: 8.2 mg/dL — ABNORMAL LOW (ref 8.9–10.3)
Chloride: 101 mmol/L (ref 101–111)
GFR calc Af Amer: >60 mL/min (ref >60)
GFR calc non Af Amer: >60 mL/min (ref >60)
GLUCOSE: 146 mg/dL — AB (ref 70–99)
POTASSIUM: 3.9 mmol/L (ref 3.5–5.1)
Sodium: 135 mmol/L (ref 135–145)
Total Protein: 5.6 g/dL — ABNORMAL LOW (ref 6.5–8.1)

## 2014-10-07 LAB — GLUCOSE, CAPILLARY
GLUCOSE-CAPILLARY: 126 mg/dL — AB (ref 70–99)
Glucose-Capillary: 162 mg/dL — ABNORMAL HIGH (ref 70–99)
Glucose-Capillary: 154 mg/dL — ABNORMAL HIGH (ref 70–99)
Glucose-Capillary: 173 mg/dL — ABNORMAL HIGH (ref 70–99)

## 2014-10-07 LAB — CBC
HEMATOCRIT: 30.8 % — AB (ref 36.0–46.0)
Hemoglobin: 10.2 g/dL — ABNORMAL LOW (ref 12.0–15.0)
MCH: 30.9 pg (ref 26.0–34.0)
MCHC: 33.1 g/dL (ref 30.0–36.0)
MCV: 93.3 fL (ref 78.0–100.0)
Platelets: 235 10*3/uL (ref 150–400)
RBC: 3.3 MIL/uL — ABNORMAL LOW (ref 3.87–5.11)
RDW: 13.2 % (ref 11.5–15.5)
WBC: 13.7 10*3/uL — AB (ref 4.0–10.5)

## 2014-10-07 MED ORDER — METOCLOPRAMIDE HCL 5 MG/ML IJ SOLN
10.0000 mg | Freq: Four times a day (QID) | INTRAMUSCULAR | Status: AC
  Administered 2014-10-07 – 2014-10-09 (×8): 10 mg via INTRAVENOUS
  Filled 2014-10-07 (×8): qty 2

## 2014-10-07 MED ORDER — KETOROLAC TROMETHAMINE 30 MG/ML IJ SOLN
30.0000 mg | Freq: Four times a day (QID) | INTRAMUSCULAR | Status: DC
  Administered 2014-10-07 – 2014-10-08 (×4): 30 mg via INTRAVENOUS
  Filled 2014-10-07 (×8): qty 1

## 2014-10-07 MED ORDER — SODIUM CHLORIDE 0.9 % IV SOLN
INTRAVENOUS | Status: AC
  Administered 2014-10-07: 250 mL/h via INTRAVENOUS

## 2014-10-07 NOTE — Progress Notes (Signed)
[facility] DAILY ICU PROGRESS NOTE                   [address]            [place] [address]          [phone]   2 Days Post-Op Procedure(s) (LRB): Right VIDEO ASSISTED THORACOSCOPY (VATS) with right lower lobe lung nodule WEDGE RESECTION (Right) right lower lobe SEGMENTECTOMY (Right) CRYO INTERCOSTAL NERVE BLOCK (N/A) LYMPH NODE DISSECTION (Right)  Total Length of Stay:  LOS: 2 days   Subjective: Per patient, pain is poorly controlled. Vomited a little while ago x1  Objective: Vital signs in last 24 hours: Temp:  [97.9 F (36.6 C)-98.3 F (36.8 C)] 98.1 F (36.7 C) (05/04 0401) Pulse Rate:  [73-84] 76 (05/04 0401) Cardiac Rhythm:  [-] Normal sinus rhythm (05/04 0401) Resp:  [12-22] 15 (05/04 0401) BP: (100-113)/(52-62) 104/55 mmHg (05/04 0401) SpO2:  [93 %-98 %] 94 % (05/04 0401)  Filed Weights   10/05/14 0545  Weight: 176 lb (79.833 kg)    Weight change:    Hemodynamic parameters for last 24 hours:    Intake/Output from previous day: 05/03 0701 - 05/04 0700 In: 7741 [P.O.:420; I.V.:1075] Out: 2055 [Urine:1425; Chest Tube:630]  Intake/Output this shift:    Current Meds: Scheduled Meds: . acetaminophen  1,000 mg Oral 4 times per day   Or  . acetaminophen (TYLENOL) oral liquid 160 mg/5 mL  1,000 mg Oral 4 times per day  . aspirin EC  81 mg Oral Daily  . bisacodyl  10 mg Oral Daily  . enoxaparin (LOVENOX) injection  40 mg Subcutaneous Q24H  . fentaNYL   Intravenous 6 times per day  . FLUoxetine  40 mg Oral Daily  . insulin aspart  0-24 Units Subcutaneous 4 times per day  . pantoprazole  40 mg Oral Daily  . pravastatin  40 mg Oral q1800  . senna-docusate  1 tablet Oral QHS   Continuous Infusions: . dextrose 5 % and 0.9 % NaCl with KCl 20 mEq/L 75 mL/hr at 10/07/14 0014   PRN Meds:.diphenhydrAMINE **OR** diphenhydrAMINE, HYDROcodone-acetaminophen, naloxone **AND** sodium chloride, ondansetron (ZOFRAN) IV, phenol, potassium chloride,  traMADol  General appearance: alert, cooperative and no distress Heart: regular rate and rhythm Lungs: coarse and dim right>left base Abdomen: mild dist, quiet, nontender Extremities: no edema Wound: incis ok  Lab Results: CBC: Recent Labs  10/06/14 0500 10/07/14 0514  WBC 12.5* 13.7*  HGB 10.0* 10.2*  HCT 29.8* 30.8*  PLT 248 235   BMET:  Recent Labs  10/06/14 0500 10/07/14 0514  NA 135 135  K 3.8 3.9  CL 102 101  CO2 24 27  GLUCOSE 132* 146*  BUN 9 5*  CREATININE 0.58 0.58  CALCIUM 8.1* 8.2*    PT/INR: No results for input(s): LABPROT, INR in the last 72 hours. Radiology: Dg Chest Port 1 View  10/06/2014   CLINICAL DATA:  Continued surveillance of RIGHT hemithorax status post VATS.  EXAM: PORTABLE CHEST - 1 VIEW  COMPARISON:  10/05/2014.  FINDINGS: Unchanged support tubes and lines. Two right-sided chest tubes with tips in the medial aspect of the upper RIGHT hemothorax and RIGHT central venous line from an IJ approach persist. No appreciable right-sided pneumothorax is evident. Low lung volumes with mild cardiac enlargement. Mild vascular congestion.  IMPRESSION: Stable chest.  No visible pneumothorax.   Electronically Signed   By: Rolla Flatten M.D.   On: 10/06/2014 07:28  Assessment/Plan: S/P Procedure(s) (LRB): Right VIDEO ASSISTED THORACOSCOPY (VATS) with right lower lobe lung nodule WEDGE RESECTION (Right) right lower lobe SEGMENTECTOMY (Right) CRYO INTERCOSTAL NERVE BLOCK (N/A) LYMPH NODE DISSECTION (Right)  1 doing well 2 will add toradol for pain x 24 hours 3 d/c ant CT- no air leak, mod sero-sand grainage 4 push pulm toilet and mobilization 5 T1a,N0 adenocarcinoma    GOLD,WAYNE E 10/07/2014 7:38 AM

## 2014-10-07 NOTE — Progress Notes (Signed)
OOB sitting on BSC, became diaphoretic and vomitted 150 cc's of green bile color secretions.  To chair no complaints. Only voided 75 cc's of dark amber urine, bladder scan done and 84 cc's recorded in bladder.  Continue to watch.

## 2014-10-08 ENCOUNTER — Inpatient Hospital Stay (HOSPITAL_COMMUNITY): Payer: Commercial Managed Care - HMO

## 2014-10-08 LAB — GLUCOSE, CAPILLARY
Glucose-Capillary: 146 mg/dL — ABNORMAL HIGH (ref 70–99)
Glucose-Capillary: 170 mg/dL — ABNORMAL HIGH (ref 70–99)
Glucose-Capillary: 159 mg/dL — ABNORMAL HIGH (ref 70–99)

## 2014-10-08 MED ORDER — SODIUM CHLORIDE 0.9 % IJ SOLN: 9.0000 mL | INTRAMUSCULAR | Status: DC | PRN

## 2014-10-08 MED ORDER — FENTANYL 10 MCG/ML IV SOLN
INTRAVENOUS | Status: AC
  Administered 2014-10-08: 98 ug via INTRAVENOUS
  Administered 2014-10-08: 50 ug via INTRAVENOUS
  Administered 2014-10-08 – 2014-10-09 (×2): 100 ug via INTRAVENOUS
  Administered 2014-10-09: 83.71 ug via INTRAVENOUS
  Administered 2014-10-09: 80 ug via INTRAVENOUS
  Administered 2014-10-09: 40 ug via INTRAVENOUS
  Administered 2014-10-09 (×2): 90 ug via INTRAVENOUS
  Administered 2014-10-10: 87.99 ug via INTRAVENOUS
  Administered 2014-10-10: 120 ug via INTRAVENOUS
  Administered 2014-10-10: 11:00:00 via INTRAVENOUS
  Administered 2014-10-10: 150 ug via INTRAVENOUS
  Administered 2014-10-10: 300 ug via INTRAVENOUS
  Administered 2014-10-10: 50 ug via INTRAVENOUS
  Administered 2014-10-11: 70 ug via INTRAVENOUS
  Administered 2014-10-11: 132.9 ug via INTRAVENOUS
  Administered 2014-10-11: 90 ug via INTRAVENOUS
  Administered 2014-10-11: 160 ug via INTRAVENOUS
  Administered 2014-10-11: 40 ug via INTRAVENOUS
  Administered 2014-10-11: 117.9 ug via INTRAVENOUS
  Administered 2014-10-11: 23:00:00 via INTRAVENOUS
  Administered 2014-10-12 (×2): 100 ug via INTRAVENOUS
  Administered 2014-10-12: 70 ug via INTRAVENOUS
  Administered 2014-10-12: 240 ug via INTRAVENOUS
  Filled 2014-10-08 (×7): qty 50

## 2014-10-08 MED ORDER — NALOXONE HCL 0.4 MG/ML IJ SOLN: 0.4000 mg | INTRAMUSCULAR | Status: DC | PRN

## 2014-10-08 MED ORDER — KETOROLAC TROMETHAMINE 30 MG/ML IJ SOLN
30.0000 mg | Freq: Four times a day (QID) | INTRAMUSCULAR | Status: AC
  Administered 2014-10-08 (×2): 30 mg via INTRAVENOUS

## 2014-10-08 MED ORDER — ONDANSETRON HCL 4 MG/2ML IJ SOLN: 4.0000 mg | Freq: Four times a day (QID) | INTRAMUSCULAR | Status: DC | PRN

## 2014-10-08 MED ORDER — DIPHENHYDRAMINE HCL 12.5 MG/5ML PO ELIX
12.5000 mg | ORAL_SOLUTION | Freq: Four times a day (QID) | ORAL | Status: DC | PRN
  Filled 2014-10-08: qty 5

## 2014-10-08 MED ORDER — DIPHENHYDRAMINE HCL 50 MG/ML IJ SOLN: 12.5000 mg | Freq: Four times a day (QID) | INTRAMUSCULAR | Status: DC | PRN

## 2014-10-08 NOTE — Progress Notes (Signed)
Patient declines to get up to the chair this morning. Educated about benefits of getting up and moving. Patient agrees to get up after breakfast. Will pass along to day shift RN.   Lum Babe, RN

## 2014-10-08 NOTE — Discharge Summary (Signed)
[address]       [place] [address]             [phone]              Discharge Summary  Name: Kelli [name] DOB: 1946/02/19 69 y.o. MRN: [id number]   Admission Date: 10/05/2014 Discharge Date:     Admitting Diagnosis: Right lower lobe lung nodule   Discharge Diagnosis:  Minimally invasive well differentiated adenocarcinoma, right lower lobe (T1a, N0)- STAGE IA  Past Medical History  Diagnosis Date  . H/O colon cancer, stage II 09/19/2011    T3N0  [place] Sept 2010  . Lung nodules 09/19/2011    granulomas  . Depression with anxiety 09/19/2011  . Hyperlipidemia 09/19/2011  . Colon cancer     colon ca dx 02/2009  . Urinary urgency   . Depression   . High cholesterol   . Anxiety      Procedures: RIGHT VIDEO ASSISTED THORACOSCOPY -  10/05/2014  RIGHT LOWER LOBE WEDGE RESECTION  RIGHT LOWER LOBE BASILAR SEGMENTECTOMY  CRYOANALGESIA INTERCOSTAL NERVES 4-7  MEDIASTINAL LYMPH NODE DISSECTION   Pathology: Diagnosis 1. Lung, wedge biopsy/resection, Right lower lobe - MINIMALLY INVASIVE WELL DIFFERENTIATED ADENOCARCINOMA WITH PROMINENT LEPIDIC GROWTH PATTERN. - TUMOR SPANS 1.7 CM IN GREATEST DIMENSION. - PLEURA IS UNINVOLVED. - SEPARATE 0.3 CM FOCUS OF ATYPICAL ADENOMATOUS HYPERPLASIA - PLEASE SEE SPECIMEN #9 FOR FINAL MARGIN STATUS. - SEE ONCOLOGY TEMPLATE. 2. Lymph node, biopsy, 9 R - ONE BENIGN LYMPH NODE WITH NO TUMOR SEEN (0/1). 3. Lymph node, biopsy, 7 R - ONE BENIGN LYMPH NODE WITH NO TUMOR SEEN (0/1). 4. Lymph node, biopsy, 11 R - ONE BENIGN LYMPH NODE WITH NO TUMOR SEEN (0/1). 5. Lymph node, biopsy, 11 R #2 - ONE BENIGN LYMPH NODE WITH NO TUMOR SEEN (0/1). 6. Lymph node, biopsy, 11 R #3 - ONE BENIGN LYMPH NODE WITH NO TUMOR SEEN (0/1). 7. Lymph node, biopsy, 12 R - ONE BENIGN LYMPH NODE WITH NO TUMOR SEEN (0/1). 8. Lymph node, biopsy, 12 R #2 - ONE BENIGN LYMPH NODE WITH NO TUMOR SEEN (0/1). 9. Lung, resection  (segmental or lobe), Right lower lobe - BENIGN LUNG WITH INCREASED PULMONARY MACROPHAGES. - BRONCHIAL AND VASCULAR MARGINS ARE NEGATIVE FOR TUMOR. 10. Lymph node, biopsy, 4 R 1 of- ONE BENIGN LYMPH NODE WITH NO TUMOR SEEN (0/1). 11. Lymph node, biopsy, 10 R - ONE BENIGN LYMPH NODE WITH NO TUMOR SEEN (0/1). 12. Lymph node, biopsy, 7 #2 - ONE BENIGN LYMPH NODE WITH NO TUMOR SEEN (0/1).  TNM Code: pT1a, pN0.  HPI:  The patient is a 69 y.o. female with a history of colon cancer and tobacco abuse. She had colon cancer in 2010 and has had no evidence of recurrence since that time. She smoked over 2 packs per day for about 40 years before quitting in 2010. She has no history of heart disease or COPD.  She had been followed by [facility] for many years. She recently established care with Dr. Julieanne Manson. She was given a clean bill of health as to her colon cancer. However, given her smoking history, he recommended a screening CT. She had had CTs previously for colon cancer noting multiple lung nodules. These have been stable for the most part except for one in the right lower lobe that has been slowly growing.  On the screening CT, the right lower lobe nodule was noted to have again increased in size. A  PET CT was done and it showed the lesion was mildly hypermetabolic.  The patient was referred to Dr. Roxan Hockey for thoracic surgical evaluation.  It was his opinion that she would benefit from right VATS resection at this time. All risks, benefits and alternatives of surgery were explained in detail, and the patient agreed to proceed.   Hospital Course:  The patient was admitted to [facility] on 10/05/2014. The patient was taken to the operating room and underwent the above procedure.    The postoperative course has generally been uneventful.  She did initially have some pain control issues, but more recently, pain has been controlled with Toradol and PCA. Her chest tube had a fair amount of output so  it remained for several days. It eventually was removed in the standard fashion and follow up chest x-rays have been unremarkable. Incisions are healing well.  She is ambulating in the halls without difficulty and is tolerating a regular diet.  Final pathology revealed a minimally invasive well differentiated adenocarcinoma (T1a, N0).  The patient has progressed well overall and is medically stable on today's date for discharge home.  Recent vital signs:  Filed Vitals:   10/11/14 0800  BP: 136/53  Pulse: 73  Temp: 98.2 (36.8)  Resp: 16    Recent laboratory studies:  CBC:  Recent Labs  10/10/14 0438  WBC 7.5  HGB 9.6*  HCT 28.8*  PLT 307   BMET:   Recent Labs  10/10/14 0438  NA 134*  K 3.5  CL 101  CO2 25  GLUCOSE 123*  BUN 7  CREATININE 0.68  CALCIUM 8.1*     Discharge Medications:     Medication List    STOP taking these medications        oxyCODONE-acetaminophen 10-650 MG per tablet  Commonly known as:  PERCOCET      TAKE these medications        aspirin 81 MG tablet  Take 81 mg by mouth daily.     b complex vitamins tablet  Take 1 tablet by mouth daily.     FLUoxetine 20 MG capsule  Commonly known as:  PROZAC  Take 2 capsules (40 mg total) by mouth daily.     HYDROcodone-acetaminophen 5-325 MG per tablet  Commonly known as:  NORCO/VICODIN  Take 1-2 tablets by mouth every 6 (six) hours as needed for moderate pain.     ibuprofen 400 MG tablet  Commonly known as:  ADVIL,MOTRIN  Take 1 tablet (400 mg total) by mouth every 6 (six) hours as needed for moderate pain.     pravastatin 40 MG tablet  Commonly known as:  PRAVACHOL  Take 1 tablet (40 mg total) by mouth daily.     Vitamin D 2000 UNITS tablet  Take 2,000 Units by mouth daily.         Discharge Instructions:  The patient is to refrain from driving, heavy lifting or strenuous activity.  May shower daily and clean incisions with soap and water.  May resume regular diet.   Follow  Up: Follow-up Information    Follow up with [name], MD On 11/03/2014.   Specialty:  Cardiothoracic Surgery   Why:  Have a chest x-ray at [facility] at 8:30, then see MD at 9:30   Contact information:   [address] [place] [address] [phone]       Follow up with [facility] RN On 10/15/2014.   Why:  For suture removal  with Dr. Leonarda Salon nurse at 9:30       ZIMMERMAN,DONIELLE M PA-C 10/11/2014, 11:24 AM

## 2014-10-08 NOTE — Progress Notes (Addendum)
TCTS DAILY ICU PROGRESS NOTE                   [address]            [place] [address]          [phone]   3 Days Post-Op Procedure(s) (LRB): Right VIDEO ASSISTED THORACOSCOPY (VATS) with right lower lobe lung nodule WEDGE RESECTION (Right) right lower lobe SEGMENTECTOMY (Right) CRYO INTERCOSTAL NERVE BLOCK (N/A) LYMPH NODE DISSECTION (Right)  Total Length of Stay:  LOS: 3 days   Subjective: Feels better, pain much better controlled  Objective: Vital signs in last 24 hours: Temp:  [97.6 F (36.4 C)-98.1 F (36.7 C)] 98.1 F (36.7 C) (05/05 0320) Pulse Rate:  [74-96] 96 (05/05 0320) Cardiac Rhythm:  [-] Normal sinus rhythm (05/05 0320) Resp:  [13-24] 24 (05/04 1916) BP: (104-137)/(43-96) 131/63 mmHg (05/05 0320) SpO2:  [89 %-98 %] 95 % (05/05 0320)  Filed Weights   10/05/14 0545  Weight: 176 lb (79.833 kg)    Weight change:    Hemodynamic parameters for last 24 hours:    Intake/Output from previous day: 05/04 0701 - 05/05 0700 In: 2035 [P.O.:360; I.V.:1675] Out: 1185 [Urine:475; Chest Tube:710]  Intake/Output this shift:    Current Meds: Scheduled Meds: . acetaminophen  1,000 mg Oral 4 times per day   Or  . acetaminophen (TYLENOL) oral liquid 160 mg/5 mL  1,000 mg Oral 4 times per day  . aspirin EC  81 mg Oral Daily  . bisacodyl  10 mg Oral Daily  . enoxaparin (LOVENOX) injection  40 mg Subcutaneous Q24H  . fentaNYL   Intravenous 6 times per day  . FLUoxetine  40 mg Oral Daily  . insulin aspart  0-24 Units Subcutaneous 4 times per day  . ketorolac  30 mg Intravenous Q6H  . metoCLOPramide (REGLAN) injection  10 mg Intravenous 4 times per day  . pantoprazole  40 mg Oral Daily  . pravastatin  40 mg Oral q1800  . senna-docusate  1 tablet Oral QHS   Continuous Infusions: . dextrose 5 % and 0.9 % NaCl with KCl 20 mEq/L 75 mL/hr at 10/07/14 1507   PRN Meds:.diphenhydrAMINE **OR** diphenhydrAMINE, HYDROcodone-acetaminophen, naloxone  **AND** sodium chloride, ondansetron (ZOFRAN) IV, phenol, potassium chloride, traMADol  General appearance: alert, cooperative and no distress Heart: regular rate and rhythm Lungs: dim in lower fields Abdomen: mod distension, soft, non-tender Extremities: no edema Wound: healing well  Lab Results: CBC: Recent Labs  10/06/14 0500 10/07/14 0514  WBC 12.5* 13.7*  HGB 10.0* 10.2*  HCT 29.8* 30.8*  PLT 248 235   BMET:  Recent Labs  10/06/14 0500 10/07/14 0514  NA 135 135  K 3.8 3.9  CL 102 101  CO2 24 27  GLUCOSE 132* 146*  BUN 9 5*  CREATININE 0.58 0.58  CALCIUM 8.1* 8.2*    PT/INR: No results for input(s): LABPROT, INR in the last 72 hours. Radiology: Dg Chest Port 1 View  10/07/2014   CLINICAL DATA:  Thoracotomy.  Chest tubes  EXAM: PORTABLE CHEST - 1 VIEW  COMPARISON:  10/06/2014  FINDINGS: 2 chest tubes on the right unchanged in position. No pneumothorax or effusion.  Progression of bibasilar airspace disease which may be atelectasis or pneumonia. No edema.  Right jugular central venous catheter tip at the cavoatrial junction.  IMPRESSION: Progression of bibasilar airspace disease.  No pneumothorax.   Electronically Signed   By: Franchot Gallo M.D.  On: 10/07/2014 07:51     Assessment/Plan: S/P Procedure(s) (LRB): Right VIDEO ASSISTED THORACOSCOPY (VATS) with right lower lobe lung nodule WEDGE RESECTION (Right) right lower lobe SEGMENTECTOMY (Right) CRYO INTERCOSTAL NERVE BLOCK (N/A) LYMPH NODE DISSECTION (Right)  1 doing well 2 needs to ambulate and work on pulm toilet aggressively, wean O2 off 3 d/c CT 4 d/c pca 5 d/c ivf 6 poss home 24-48 hours     GOLD,WAYNE E 10/08/2014 7:32 AM Patient seen and examined, agree with above She had 700 ml out of CT yesterday, will keep tube in this morning and see how much it drains during day  Ilianna Bown C. Roxan Hockey, MD [facility] [phone]

## 2014-10-09 ENCOUNTER — Inpatient Hospital Stay (HOSPITAL_COMMUNITY): Payer: Commercial Managed Care - HMO

## 2014-10-09 DIAGNOSIS — Z85118 Personal history of other malignant neoplasm of bronchus and lung: Secondary | ICD-10-CM

## 2014-10-09 DIAGNOSIS — C3430 Malignant neoplasm of lower lobe, unspecified bronchus or lung: Secondary | ICD-10-CM

## 2014-10-09 LAB — GLUCOSE, CAPILLARY
GLUCOSE-CAPILLARY: 106 mg/dL — AB (ref 70–99)
GLUCOSE-CAPILLARY: 138 mg/dL — AB (ref 70–99)
GLUCOSE-CAPILLARY: 158 mg/dL — AB (ref 70–99)
Glucose-Capillary: 145 mg/dL — ABNORMAL HIGH (ref 70–99)
Glucose-Capillary: 156 mg/dL — ABNORMAL HIGH (ref 70–99)

## 2014-10-09 MED ORDER — IBUPROFEN 200 MG PO TABS
400.0000 mg | ORAL_TABLET | Freq: Four times a day (QID) | ORAL | Status: DC | PRN
  Administered 2014-10-09 – 2014-10-13 (×5): 400 mg via ORAL
  Filled 2014-10-09 (×5): qty 2

## 2014-10-09 MED ORDER — INSULIN ASPART 100 UNIT/ML ~~LOC~~ SOLN: 0.0000 [IU] | Freq: Every day | SUBCUTANEOUS | Status: DC

## 2014-10-09 MED ORDER — HYDROMORPHONE HCL 2 MG PO TABS
2.0000 mg | ORAL_TABLET | ORAL | Status: DC | PRN
  Administered 2014-10-09 – 2014-10-10 (×3): 2 mg via ORAL
  Filled 2014-10-09 (×4): qty 1

## 2014-10-09 MED ORDER — INSULIN ASPART 100 UNIT/ML ~~LOC~~ SOLN
0.0000 [IU] | Freq: Three times a day (TID) | SUBCUTANEOUS | Status: DC
  Administered 2014-10-09 – 2014-10-10 (×2): 2 [IU] via SUBCUTANEOUS

## 2014-10-09 MED ORDER — PROMETHAZINE HCL 25 MG/ML IJ SOLN
12.5000 mg | Freq: Four times a day (QID) | INTRAMUSCULAR | Status: DC | PRN
  Administered 2014-10-09: 12.5 mg via INTRAVENOUS
  Filled 2014-10-09: qty 1

## 2014-10-09 NOTE — Care Management Note (Signed)
Case Management Note  Patient Details  Name: Kelli [name] MRN: [id number] Date of Birth: 1946/02/27  Subjective/Objective:      Pt admitted s/p VATS              Action/Plan:  PTA pt lived at home - NCM to follow for d/c needs   Expected Discharge Date:  10/12/14               Expected Discharge Plan:  Home/Self Care  In-House Referral:     Discharge planning Services  CM Consult  Post Acute Care Choice:    Choice offered to:     DME Arranged:    DME Agency:     HH Arranged:    [place] Agency:     Status of Service:  In process, will continue to follow  Medicare Important Message Given:  Yes Date Medicare IM Given:  10/09/14 Medicare IM give by:  Marvetta Gibbons RN Date Additional Medicare IM Given:    Additional Medicare Important Message give by:     If discussed at [place] of Stay Meetings, dates discussed:    Additional Comments:  Dawayne Patricia, RN 10/09/2014, 12:39 PM

## 2014-10-09 NOTE — Progress Notes (Signed)
Medicare Important Message given? YES  (If response is "NO", the following Medicare IM given date fields will be blank)  Date Medicare IM given: 10/09/14 Medicare IM given by:  Dahlia Client [employer]

## 2014-10-09 NOTE — Progress Notes (Addendum)
TCTS DAILY ICU PROGRESS NOTE                   [address]            [place] [address]          [phone]   4 Days Post-Op Procedure(s) (LRB): Right VIDEO ASSISTED THORACOSCOPY (VATS) with right lower lobe lung nodule WEDGE RESECTION (Right) right lower lobe SEGMENTECTOMY (Right) CRYO INTERCOSTAL NERVE BLOCK (N/A) LYMPH NODE DISSECTION (Right)  Total Length of Stay:  LOS: 4 days   Subjective: Pain poorly controlled per patient off toradol  Objective: Vital signs in last 24 hours: Temp:  [97.7 F (36.5 C)-99.5 F (37.5 C)] 97.7 F (36.5 C) (05/06 0718) Pulse Rate:  [79-89] 89 (05/06 0718) Cardiac Rhythm:  [-] Normal sinus rhythm (05/06 0718) Resp:  [17-22] 17 (05/06 0718) BP: (121-142)/(48-65) 124/54 mmHg (05/06 0718) SpO2:  [93 %-98 %] 98 % (05/06 0718)  Filed Weights   10/05/14 0545  Weight: 176 lb (79.833 kg)    Weight change:    Hemodynamic parameters for last 24 hours:    Intake/Output from previous day: 05/05 0701 - 05/06 0700 In: 561.3 [P.O.:480; I.V.:81.3] Out: 1441 [Urine:1000; Stool:1; Chest Tube:440]  Intake/Output this shift: Total I/O In: -  Out: 200 [Urine:150; Chest Tube:50]  Current Meds: Scheduled Meds: . acetaminophen  1,000 mg Oral 4 times per day   Or  . acetaminophen (TYLENOL) oral liquid 160 mg/5 mL  1,000 mg Oral 4 times per day  . aspirin EC  81 mg Oral Daily  . bisacodyl  10 mg Oral Daily  . enoxaparin (LOVENOX) injection  40 mg Subcutaneous Q24H  . fentaNYL   Intravenous 6 times per day  . FLUoxetine  40 mg Oral Daily  . insulin aspart  0-24 Units Subcutaneous 4 times per day  . metoCLOPramide (REGLAN) injection  10 mg Intravenous 4 times per day  . pantoprazole  40 mg Oral Daily  . pravastatin  40 mg Oral q1800  . senna-docusate  1 tablet Oral QHS   Continuous Infusions:  PRN Meds:.diphenhydrAMINE **OR** diphenhydrAMINE, HYDROcodone-acetaminophen, naloxone **AND** sodium chloride, ondansetron (ZOFRAN)  IV, phenol, potassium chloride, traMADol  General appearance: alert, cooperative and no distress Heart: regular rate and rhythm Lungs: crackles right lower fields Abdomen: soft, non-tender Extremities: no edema Wound: incis healing well  Lab Results: CBC: Recent Labs  10/07/14 0514  WBC 13.7*  HGB 10.2*  HCT 30.8*  PLT 235   BMET:  Recent Labs  10/07/14 0514  NA 135  K 3.9  CL 101  CO2 27  GLUCOSE 146*  BUN 5*  CREATININE 0.58  CALCIUM 8.2*    PT/INR: No results for input(s): LABPROT, INR in the last 72 hours. Radiology: Dg Chest Port 1 View  10/08/2014   CLINICAL DATA:  Chest tube.  EXAM: PORTABLE CHEST - 1 VIEW  COMPARISON:  10/07/2014.  FINDINGS: Interim removal of lower right chest tube. Upper right chest tube in stable position. Right IJ line in stable position. Mediastinum hilar structures are stable. Low lung volumes with bibasilar atelectasis and/or infiltrates. No pleural effusion or pneumothorax. Heart size stable. No acute bony abnormality.  IMPRESSION: 1. Interim removal of lower right chest tube. Upper right chest tube in stable position. No pneumothorax. Right IJ line stable position. 2. Persistent low lung volumes with mild bibasilar atelectasis and/or infiltrates.   Electronically Signed   By: Marcello Moores  Register   On: 10/08/2014 07:06  Assessment/Plan: S/P Procedure(s) (LRB): Right VIDEO ASSISTED THORACOSCOPY (VATS) with right lower lobe lung nodule WEDGE RESECTION (Right) right lower lobe SEGMENTECTOMY (Right) CRYO INTERCOSTAL NERVE BLOCK (N/A) LYMPH NODE DISSECTION (Right)  1 slow progression, not ambulating much , poorly motivated with activity progression 2 will d/c foley for voiding trial 3 moderate chest tube drainage( 270 out yesterday and 320 since midnight) , increase in pntx/space to about 15% , no air leak- leave CT for now 4 + BM yesterday 5 hopefully can d/c pca soon, will add motrin for pain prn as nsaid did help with  pain     GOLD,WAYNE E 10/09/2014 8:28 AM Patient seen and examined, agree with above Still draining a little much to dc CT Pain control remains an issue  Remo Lipps C. Roxan Hockey, MD [facility] [phone]

## 2014-10-10 ENCOUNTER — Inpatient Hospital Stay (HOSPITAL_COMMUNITY): Payer: Commercial Managed Care - HMO

## 2014-10-10 LAB — BASIC METABOLIC PANEL
Anion gap: 8 (ref 5–15)
BUN: 7 mg/dL (ref 6–20)
CO2: 25 mmol/L (ref 22–32)
Calcium: 8.1 mg/dL — ABNORMAL LOW (ref 8.9–10.3)
Chloride: 101 mmol/L (ref 101–111)
Creatinine, Ser: 0.68 mg/dL (ref 0.44–1.00)
GFR calc Af Amer: >60 mL/min (ref >60)
GLUCOSE: 123 mg/dL — AB (ref 70–99)
POTASSIUM: 3.5 mmol/L (ref 3.5–5.1)
SODIUM: 134 mmol/L — AB (ref 135–145)

## 2014-10-10 LAB — CBC
HCT: 28.8 % — ABNORMAL LOW (ref 36.0–46.0)
HEMOGLOBIN: 9.6 g/dL — AB (ref 12.0–15.0)
MCH: 30.8 pg (ref 26.0–34.0)
MCHC: 33.3 g/dL (ref 30.0–36.0)
MCV: 92.3 fL (ref 78.0–100.0)
Platelets: 307 10*3/uL (ref 150–400)
RBC: 3.12 MIL/uL — ABNORMAL LOW (ref 3.87–5.11)
RDW: 13.4 % (ref 11.5–15.5)
WBC: 7.5 10*3/uL (ref 4.0–10.5)

## 2014-10-10 LAB — GLUCOSE, CAPILLARY
GLUCOSE-CAPILLARY: 104 mg/dL — AB (ref 70–99)
Glucose-Capillary: 140 mg/dL — ABNORMAL HIGH (ref 70–99)

## 2014-10-10 MED ORDER — SODIUM CHLORIDE 0.9 % IJ SOLN
10.0000 mL | Freq: Two times a day (BID) | INTRAMUSCULAR | Status: DC
  Administered 2014-10-10 – 2014-10-12 (×6): 10 mL

## 2014-10-10 MED ORDER — SODIUM CHLORIDE 0.9 % IJ SOLN: 10.0000 mL | INTRAMUSCULAR | Status: DC | PRN

## 2014-10-10 MED ORDER — POTASSIUM CHLORIDE CRYS ER 20 MEQ PO TBCR
20.0000 meq | EXTENDED_RELEASE_TABLET | Freq: Once | ORAL | Status: AC
  Administered 2014-10-10: 20 meq via ORAL
  Filled 2014-10-10: qty 1

## 2014-10-10 NOTE — Progress Notes (Addendum)
      [address]       [place] [address]             [phone]       5 Days Post-Op Procedure(s) (LRB): Right VIDEO ASSISTED THORACOSCOPY (VATS) with right lower lobe lung nodule WEDGE RESECTION (Right) right lower lobe SEGMENTECTOMY (Right) CRYO INTERCOSTAL NERVE BLOCK (N/A) LYMPH NODE DISSECTION (Right)  Subjective: Patient states pain under better control this am.  Objective: Vital signs in last 24 hours: Temp:  [98 F (36.7 C)-98.8 F (37.1 C)] 98 F (36.7 C) (05/07 0455) Pulse Rate:  [85-97] 97 (05/07 0455) Cardiac Rhythm:  [-] Normal sinus rhythm (05/06 2000) Resp:  [5-24] 5 (05/07 0800) BP: (119-136)/(51-66) 134/61 mmHg (05/07 0455) SpO2:  [93 %-98 %] 93 % (05/07 0800) Weight:  [185 lb 6.5 oz (84.1 kg)] 185 lb 6.5 oz (84.1 kg) (05/07 0455)      Intake/Output from previous day: 05/06 0701 - 05/07 0700 In: 360 [P.O.:360] Out: 1160 [Urine:750; Chest Tube:410]   Physical Exam:  Cardiovascular: RRR Pulmonary: Clear to auscultation on left and slightly diminished at right base; no rales, wheezes, or rhonchi. Abdomen: Soft, non tender, bowel sounds present. Extremities: SCDs in place Wounds: Clean and dry.  No erythema or signs of infection. Chest Tube:  Lab Results: CBC: Recent Labs  10/10/14 0438  WBC 7.5  HGB 9.6*  HCT 28.8*  PLT 307   BMET:  Recent Labs  10/10/14 0438  NA 134*  K 3.5  CL 101  CO2 25  GLUCOSE 123*  BUN 7  CREATININE 0.68  CALCIUM 8.1*    PT/INR: No results for input(s): LABPROT, INR in the last 72 hours. ABG:  INR: Will add last result for INR, ABG once components are confirmed Will add last 4 CBG results once components are confirmed  Assessment/Plan:  1. CV - SR in the 90's. 2.  Pulmonary - On 2 liters of oxygen via [place]. Wean as tolerates. Chest tubes with 210 cc of output last 12 hours. There is no air leak. Chest tube to continue for now. CXR shows small, stable right pneumuthorax, right lung  atelectasis. Encourage incentive spirometer. 3. ABL anemia-H and H 9.6 and 28.8 this am. 4. Supplement potassium 5. Pain control better this am. Continue with current medications. Will stop PCA once chest tube removed. 6. CBGs 138/106/156. Tolerating diet. No history of diabetes. Will stop accu checks and SS PRN.  ZIMMERMAN,DONIELLE MPA-C 10/10/2014,10:05 AM  I have seen and examined the patient and agree with the assessment and plan as outlined.  Leave chest tube in place today  Rexene Alberts 10/10/2014 12:21 PM

## 2014-10-10 NOTE — Progress Notes (Signed)
UR COMPLETED  

## 2014-10-11 ENCOUNTER — Inpatient Hospital Stay (HOSPITAL_COMMUNITY): Payer: Commercial Managed Care - HMO

## 2014-10-11 NOTE — Progress Notes (Addendum)
      [address]       [place] [address]             [phone]       6 Days Post-Op Procedure(s) (LRB): Right VIDEO ASSISTED THORACOSCOPY (VATS) with right lower lobe lung nodule WEDGE RESECTION (Right) right lower lobe SEGMENTECTOMY (Right) CRYO INTERCOSTAL NERVE BLOCK (N/A) LYMPH NODE DISSECTION (Right)  Subjective: Patient states pain under better control this am.  Objective: Vital signs in last 24 hours: Temp:  [98.2 F (36.8 C)-99.7 F (37.6 C)] 98.2 F (36.8 C) (05/08 0700) Pulse Rate:  [73-94] 73 (05/08 0752) Cardiac Rhythm:  [-] Normal sinus rhythm (05/08 0800) Resp:  [15-22] 16 (05/08 0800) BP: (124-144)/(61-71) 136/63 mmHg (05/08 0751) SpO2:  [94 %-100 %] 100 % (05/08 0800)      Intake/Output from previous day: 05/07 0701 - 05/08 0700 In: 430 [P.O.:420; I.V.:10] Out: 975 [Urine:825; Chest Tube:150]   Physical Exam:  Cardiovascular: RRR Pulmonary: Clear to auscultation on left and slightly diminished at right base; no rales, wheezes, or rhonchi. Abdomen: Soft, non tender, bowel sounds present. Extremities: SCDs in place Wounds: Clean and dry.  No erythema or signs of infection. Chest Tube: to water seal, tidling with cough  Lab Results: CBC:  Recent Labs  10/10/14 0438  WBC 7.5  HGB 9.6*  HCT 28.8*  PLT 307   BMET:   Recent Labs  10/10/14 0438  NA 134*  K 3.5  CL 101  CO2 25  GLUCOSE 123*  BUN 7  CREATININE 0.68  CALCIUM 8.1*    PT/INR: No results for input(s): LABPROT, INR in the last 72 hours. ABG:  INR: Will add last result for INR, ABG once components are confirmed Will add last 4 CBG results once components are confirmed  Assessment/Plan:  1. CV - SR in the 90's. 2.  Pulmonary - On 2 liters of oxygen via [place]. Wean as tolerates. Chest tubes with 250 cc since my mark yesterday.  There is no air leak but has a little tidling with cough. Chest tube to continue for now as still with a fair amount of  drainage. CXR shows small, stable right pneumuthorax, right lung atelectasis. Encourage incentive spirometer. 3. ABL anemia-H and H 9.6 and 28.8 this am. 4. Pain control better this am. Continue with current medications. Will stop PCA once chest tube removed. 5. Must AMUBLATE. I stressed the importance of this and she said she will walk "in a bit"  ZIMMERMAN,DONIELLE MPA-C 10/11/2014,10:14 AM    I have seen and examined the patient and agree with the assessment and plan as outlined.  Leave chest tube in place to H20 seal today  Rexene Alberts 10/11/2014 12:27 PM

## 2014-10-12 ENCOUNTER — Inpatient Hospital Stay (HOSPITAL_COMMUNITY): Payer: Commercial Managed Care - HMO

## 2014-10-12 NOTE — Progress Notes (Signed)
7 Days Post-Op Procedure(s) (LRB): Right VIDEO ASSISTED THORACOSCOPY (VATS) with right lower lobe lung nodule WEDGE RESECTION (Right) right lower lobe SEGMENTECTOMY (Right) CRYO INTERCOSTAL NERVE BLOCK (N/A) LYMPH NODE DISSECTION (Right) Subjective: Feels better  Objective: Vital signs in last 24 hours: Temp:  [97.7 F (36.5 C)-98.5 F (36.9 C)] 98.3 F (36.8 C) (05/09 0526) Pulse Rate:  [81-91] 85 (05/09 0744) Cardiac Rhythm:  [-] Normal sinus rhythm (05/09 0526) Resp:  [15-23] 19 (05/09 0744) BP: (132-140)/(61-70) 132/67 mmHg (05/09 0744) SpO2:  [9 %-100 %] 96 % (05/09 0744)  Hemodynamic parameters for last 24 hours:    Intake/Output from previous day: 05/08 0701 - 05/09 0700 In: 360 [P.O.:360] Out: 950 [Urine:450; Chest Tube:500] Intake/Output this shift: Total I/O In: -  Out: 450 [Urine:350; Chest Tube:100]  General appearance: alert and no distress Neurologic: intact Heart: regular rate and rhythm Lungs: clear to auscultation bilaterally serous drainage from CT  Lab Results:  Recent Labs  10/10/14 0438  WBC 7.5  HGB 9.6*  HCT 28.8*  PLT 307   BMET:  Recent Labs  10/10/14 0438  NA 134*  K 3.5  CL 101  CO2 25  GLUCOSE 123*  BUN 7  CREATININE 0.68  CALCIUM 8.1*    PT/INR: No results for input(s): LABPROT, INR in the last 72 hours. ABG    Component Value Date/Time   PHART 7.373 10/06/2014 0450   HCO3 24.9* 10/06/2014 0450   TCO2 26.2 10/06/2014 0450   ACIDBASEDEF 0.3 10/01/2014 1240   O2SAT 97.1 10/06/2014 0450   CBG (last 3)   Recent Labs  10/09/14 2151 10/10/14 0840 10/10/14 1236  GLUCAP 156* 140* 104*    Assessment/Plan: S/P Procedure(s) (LRB): Right VIDEO ASSISTED THORACOSCOPY (VATS) with right lower lobe lung nodule WEDGE RESECTION (Right) right lower lobe SEGMENTECTOMY (Right) CRYO INTERCOSTAL NERVE BLOCK (N/A) LYMPH NODE DISSECTION (Right) -  Doing well  Drainage from tube remains a little high, but at this point will  dc CT and follow. If effusion develops a pleur-x may be a good alternative Dc pca after CT out Continue ambulation Possibly home tomorrow  LOS: 7 days    Kelli [name] 10/12/2014

## 2014-10-12 NOTE — Progress Notes (Signed)
Medicare Important Message given?  YES (If response is "NO", the following Medicare IM given date fields will be blank) Date Medicare IM given:  10/12/14 Medicare IM given by:  Tomi Bamberger

## 2014-10-12 NOTE — Evaluation (Signed)
Occupational Therapy Evaluation Patient Details Name: Kelli [name] MRN: [id number] DOB: 01/28/1946 Today's Date: 10/12/2014    History of Present Illness This 69 y.o. female admitted for VATS and wedge resection RLL due to stage 1b NSCLC.  PMH:  colon CA, tobacco abuse, depression/anxiety    Clinical Impression   Pt admitted with above. She demonstrates the below listed deficits and will benefit from continued OT to maximize safety and independence with BADLs.  Pt currently requires supervision for ADLs with DOE 3/4.  Discussed energy conservation techniques and recommend 3in1 for use in shower.  She will have intermittent assist at discharge.       Follow Up Recommendations  No OT follow up;Supervision - Intermittent    Equipment Recommendations  3 in 1 bedside comode    Recommendations for Other Services       Precautions / Restrictions Precautions Precautions: None      Mobility Bed Mobility Overal bed mobility: Modified Independent                Transfers Overall transfer level: Modified independent Equipment used: None                  Balance Overall balance assessment: No apparent balance deficits (not formally assessed) (able to retrieve items from floor without LOB )                                          ADL Overall ADL's : Needs assistance/impaired Eating/Feeding: Independent   Grooming: Wash/dry hands;Wash/dry face;Oral care;Brushing hair;Supervision/safety;Standing   Upper Body Bathing: Set up;Sitting   Lower Body Bathing: Supervison/ safety;Sit to/from stand   Upper Body Dressing : Set up;Sitting   Lower Body Dressing: Supervision/safety;Sit to/from stand   Toilet Transfer: Supervision/safety;Ambulation;Comfort height toilet;Regular Toilet   Toileting- Clothing Manipulation and Hygiene: Supervision/safety;Sit to/from Nurse, children's Details (indicate cue type and reason): discussed need to sit  to shower and discussed options for tub seat  Functional mobility during ADLs: Supervision/safety General ADL Comments: Reviewed energy conservatino techniques with pt.      Vision     Perception     Praxis      Pertinent Vitals/Pain Pain Assessment: No/denies pain     Hand Dominance Right   Extremity/Trunk Assessment Upper Extremity Assessment Upper Extremity Assessment: Overall WFL for tasks assessed   Lower Extremity Assessment Lower Extremity Assessment: Defer to PT evaluation   Cervical / Trunk Assessment Cervical / Trunk Assessment: Normal   Communication Communication Communication: No difficulties   Cognition Arousal/Alertness: Awake/alert Behavior During Therapy: WFL for tasks assessed/performed Overall Cognitive Status: Within Functional Limits for tasks assessed                     General Comments       Exercises       Shoulder Instructions      Home Living Family/patient expects to be discharged to:: Private residence Living Arrangements: Alone Available Help at Discharge: Friend(s);Family;Available PRN/intermittently Type of Home: House Home Access: Stairs to enter [employer] of Steps: 4 Entrance Stairs-Rails: Right;Left Home Layout: One level     Bathroom Shower/Tub: Tub/shower unit;Curtain Shower/tub characteristics: Architectural technologist: Standard     Home Equipment: None          Prior Functioning/Environment Level of Independence: Independent        Comments:  denies falls.  She drives and is independent with IADLs     OT Diagnosis: Generalized weakness   OT Problem List: Decreased strength;Decreased activity tolerance;Impaired balance (sitting and/or standing);Decreased knowledge of use of DME or AE   OT Treatment/Interventions: Self-care/ADL training;Energy conservation;DME and/or AE instruction;Patient/family education    OT Goals(Current goals can be found in the care plan section) Acute Rehab  OT Goals Patient Stated Goal: to go home  OT Goal Formulation: With patient Time For Goal Achievement: 10/19/14 Potential to Achieve Goals: Good ADL Goals Additional ADL Goal #1: Pt will be mod I with ADLs  Additional ADL Goal #2: Pt will be independent with energy conservation techniques   OT Frequency: Min 2X/week   Barriers to D/C: Decreased caregiver support          Co-evaluation              End of Session Nurse Communication: Mobility status  Activity Tolerance: Patient tolerated treatment well Patient left: in bed;with call bell/phone within reach   Time: 1532-1550 OT Time Calculation (min): 18 min Charges:  OT General Charges $OT Visit: 1 Procedure OT Evaluation $Initial OT Evaluation Tier I: 1 Procedure G-Codes:    Charolette Bultman M November 07, 2014, 4:00 PM

## 2014-10-13 ENCOUNTER — Inpatient Hospital Stay (HOSPITAL_COMMUNITY): Payer: Commercial Managed Care - HMO

## 2014-10-13 MED ORDER — HYDROCODONE-ACETAMINOPHEN 5-325 MG PO TABS: 1.0000 | ORAL_TABLET | Freq: Four times a day (QID) | ORAL | Status: DC | PRN

## 2014-10-13 MED ORDER — IBUPROFEN 400 MG PO TABS: 400.0000 mg | ORAL_TABLET | Freq: Four times a day (QID) | ORAL | Status: DC | PRN

## 2014-10-13 NOTE — Progress Notes (Signed)
Discharge instructions given to patient and sister with teach back.  All questions answered prescriptions given. Central line site unremarkable, dressing change done to right chest tube site.  Sutures intact, no complaints.  Discharged home.

## 2014-10-13 NOTE — Progress Notes (Addendum)
[facility] DAILY ICU PROGRESS NOTE                   [name]            [place] [address]          [phone]   8 Days Post-Op Procedure(s) (LRB): Right VIDEO ASSISTED THORACOSCOPY (VATS) with right lower lobe lung nodule WEDGE RESECTION (Right) right lower lobe SEGMENTECTOMY (Right) CRYO INTERCOSTAL NERVE BLOCK (N/A) LYMPH NODE DISSECTION (Right)  Total Length of Stay:  LOS: 8 days   Subjective: Feels well, mp new issues, wants to go home   Objective: Vital signs in last 24 hours: Temp:  [97.7 F (36.5 C)-98.6 F (37 C)] 97.7 F (36.5 C) (05/10 0748) Pulse Rate:  [70-84] 72 (05/10 0749) Cardiac Rhythm:  [-] Normal sinus rhythm (05/10 0800) Resp:  [14-29] 14 (05/10 0749) BP: (126-153)/(59-80) 128/59 mmHg (05/10 0748) SpO2:  [92 %-97 %] 92 % (05/10 0749)  Filed Weights   10/05/14 0545 10/10/14 0455  Weight: 176 lb (79.833 kg) 185 lb 6.5 oz (84.1 kg)    Weight change:    Hemodynamic parameters for last 24 hours:    Intake/Output from previous day: 05/09 0701 - 05/10 0700 In: 1320 [P.O.:1320] Out: 450 [Urine:350; Chest Tube:100]  Intake/Output this shift:    Current Meds: Scheduled Meds: . aspirin EC  81 mg Oral Daily  . bisacodyl  10 mg Oral Daily  . enoxaparin (LOVENOX) injection  40 mg Subcutaneous Q24H  . FLUoxetine  40 mg Oral Daily  . pantoprazole  40 mg Oral Daily  . pravastatin  40 mg Oral q1800  . senna-docusate  1 tablet Oral QHS  . sodium chloride  10-40 mL Intracatheter Q12H   Continuous Infusions:  PRN Meds:.diphenhydrAMINE **OR** diphenhydrAMINE, HYDROcodone-acetaminophen, HYDROmorphone, ibuprofen, naloxone **AND** sodium chloride, ondansetron (ZOFRAN) IV, phenol, potassium chloride, promethazine, sodium chloride  General appearance: alert, cooperative and no distress Heart: regular rate and rhythm Lungs: slightly coarse on right Abdomen: benign Extremities: no edema, redness or tenderness in the calves or thighs Wound:  incis healing well  Lab Results: CBC:No results for input(s): WBC, HGB, HCT, PLT in the last 72 hours. BMET: No results for input(s): NA, K, CL, CO2, GLUCOSE, BUN, CREATININE, CALCIUM in the last 72 hours.  PT/INR: No results for input(s): LABPROT, INR in the last 72 hours. Radiology: Dg Chest 1v Repeat Same Day  10/12/2014   CLINICAL DATA:  Chest tube removal  EXAM: CHEST - 1 VIEW SAME DAY  COMPARISON:  Portable exam 0919 hours compared to 0550 hours  FINDINGS: Interval removal of RIGHT thoracostomy tube.  RIGHT jugular line stable tip with projecting over SVC.  Stable heart size, mediastinal contours and pulmonary vascularity.  Volume loss in RIGHT hemi thorax with mild elevation of RIGHT diaphragm and basilar atelectasis.  Small RIGHT apex pneumothorax, little changed from previous study.  LEFT lung clear.  Calcified breast prostheses.  Bones demineralized.  IMPRESSION: Persistent small RIGHT apex pneumothorax following chest tube removal.  RIGHT basilar atelectasis.   Electronically Signed   By: Lavonia Dana M.D.   On: 10/12/2014 09:36   Dg Chest Port 1 View  10/12/2014   CLINICAL DATA:  Chest tube  EXAM: PORTABLE CHEST - 1 VIEW  COMPARISON:  Chest x-ray from yesterday  FINDINGS: Right-sided chest tube is in similar position. A small right apical pneumothorax is unchanged, 1 rib interspace posteriorly in size. Right IJ central line, tip at the [facility]  level.  Unchanged hazy opacity at the right base, likely atelectasis. There is volume loss on the right post lower lobe segmentectomy.  Normal heart size and stable mediastinal contours.  Calcified breast implants.  IMPRESSION: Unchanged small (<10%) right apical pneumothorax with chest tube.   Electronically Signed   By: Monte Fantasia M.D.   On: 10/12/2014 07:59     Assessment/Plan: S/P Procedure(s) (LRB): Right VIDEO ASSISTED THORACOSCOPY (VATS) with right lower lobe lung nodule WEDGE RESECTION (Right) right lower lobe SEGMENTECTOMY (Right) CRYO  INTERCOSTAL NERVE BLOCK (N/A) LYMPH NODE DISSECTION (Right)   1 CXR stable, minor effus , pntx/space is stable 2 ready for discharge to home     [name],[name] 10/13/2014 8:24 AM Patient seen and examined, agree with above Home today  Kelli Prude C. Roxan Hockey, MD [facility] [phone]

## 2014-10-13 NOTE — Discharge Instructions (Signed)
Thoracotomy, Care After ° These instructions give you information on caring for yourself after your procedure. Your doctor may also give you more specific instructions. Call your doctor if you have any problems or questions after your procedure. °HOME CARE °· Only take medicine as told by your doctor. Take pain medicine before your pain gets very bad. °· Take deep breaths to protect yourself from a lung infection (pneumonia). °· Cough often to clear thick spit (mucus) and to open your lungs. Hold a pillow against your chest if it hurts to cough. °· Use your breathing machine (incentive spirometer) as told. °· Change bandages (dressings) as told by your doctor. °· Take off the bandage over your chest tube area as told by your doctor. °· Continue your normal diet as told by your doctor. °· Eat high-fiber foods. This includes whole grain cereals, brown rice, beans, and fresh fruits and vegetables. °· Drink enough fluids to keep your pee (urine) clear or pale yellow. Avoid caffeine. °· Talk to your doctor about taking a medicine to soften your poop (stool softener, laxative). °· Keep doing activities as told by your doctor. Balance your activity with rest. °· Avoid lifting until your doctor says it is okay. °· Do not drive until told by your doctor. Do not drive while taking pain medicines (narcotics). °· Do not bathe, swim, or use a hot tub until told by your doctor. You may shower. Gently wash the area of your cut (incision) with soap and water as told. °· Do not use any tobacco products including cigarettes, chewing tobacco, and electronic cigarettes. Avoid being around others who smoke. °· Schedule a doctor visit to get your stitches or staples removed as told. °· Schedule and go to all doctor visits as told. °· Go to therapy that can help improve your lungs (pulmonary rehabilitation) as told by your doctor. °· Do not travel by airplane for 2 weeks after the chest tube is taken out. °GET HELP IF: °· You are bleeding  from your wounds. °· You have redness, puffiness (swelling), or more pain in the wounds. °· You feel your heart beating fast or skipping beats (irregular heartbeat). °· You have yellowish-white fluid (pus) coming from your wounds. °· You have a bad smell coming from the wound or bandage. °· You have a fever or chills. °· You feel sick to your stomach or you throw up (vomit). °· You have muscle aches. °GET HELP RIGHT AWAY IF: °· You have a rash. °· You have trouble breathing. °· Your medicines are causing you problems. °· You keep feeling sick to your stomach (nauseous). °· You feel light-headed. °· You have shortness of breath or chest pain. °· You have pain that will not go away. °MAKE SURE YOU: °· Understand these instructions. °· Will watch your condition. °· Will get help right away if you are not doing well or get worse. °Document Released: 11/21/2011 Document Revised: 10/06/2013 Document Reviewed: 01/08/2013 °ExitCare® Patient Information ©2015 [employer]. This information is not intended to replace advice given to you by your health care provider. Make sure you discuss any questions you have with your health care provider. ° °

## 2014-10-13 NOTE — Progress Notes (Signed)
Occupational Therapy Treatment Patient Details Name: KATALEENA HOLSAPPLE MRN: [id number] DOB: 1945-09-26 Today's Date: 10/13/2014    History of present illness This 69 y.o. female admitted for VATS and wedge resection RLL due to stage 1b NSCLC.  PMH:  colon CA, tobacco abuse, depression/anxiety    OT comments  Reviewed energy conservation techniques with pt.  She is eager to discharge home.   Follow Up Recommendations  No OT follow up;Supervision - Intermittent    Equipment Recommendations  3 in 1 bedside comode    Recommendations for Other Services      Precautions / Restrictions Precautions Precautions: None       Mobility Bed Mobility Overal bed mobility: Modified Independent                Transfers Overall transfer level: Modified independent Equipment used: None                  Balance Overall balance assessment: No apparent balance deficits (not formally assessed)                                 ADL                                         General ADL Comments: Pt reports she performed bathing and dressing this am without assist.  Reviewed energy conservation techniques with pt and she was able to verbalize them independently       Vision                     Perception     Praxis      Cognition   Behavior During Therapy: [facility] for tasks assessed/performed Overall Cognitive Status: Within Functional Limits for tasks assessed                       Extremity/Trunk Assessment  Upper Extremity Assessment Upper Extremity Assessment: Overall WFL for tasks assessed   Lower Extremity Assessment Lower Extremity Assessment: Overall WFL for tasks assessed   Cervical / Trunk Assessment Cervical / Trunk Assessment: Normal    Exercises     Shoulder Instructions       General Comments      Pertinent Vitals/ Pain       Pain Assessment: No/denies pain  Home Living Family/patient expects to be  discharged to:: Private residence Living Arrangements: Alone Available Help at Discharge: Friend(s);Family;Available PRN/intermittently Type of Home: House Home Access: Stairs to enter [employer] of Steps: 4 Entrance Stairs-Rails: Right;Left Home Layout: One level               Home Equipment: None          Prior Functioning/Environment Level of Independence: Independent        Comments: denies falls.  She drives and is independent with IADLs    Frequency Min 2X/week     Progress Toward Goals  OT Goals(current goals can now be found in the care plan section)     Acute Rehab OT Goals Patient Stated Goal: to go home  ADL Goals Additional ADL Goal #1: Pt will be mod I with ADLs  Additional ADL Goal #2: Pt will be independent with energy conservation techniques   Plan Discharge plan remains appropriate    Co-evaluation  End of Session     Activity Tolerance Patient tolerated treatment well   Patient Left in bed;with call bell/phone within reach   Nurse Communication Other (comment) (need for 3in1)        Time: 3494-9447 OT Time Calculation (min): 13 min  Charges: OT General Charges $OT Visit: 1 Procedure OT Treatments $Self Care/Home Management : 8-22 mins  Kyliyah Stirn M 10/13/2014, 12:10 PM

## 2014-10-13 NOTE — Evaluation (Signed)
Physical Therapy Evaluation Patient Details Name: Kelli [name] MRN: [id number] DOB: [birth year] Today's Date: 10/13/2014   History of Present Illness  This 69 y.o. female admitted for VATS and wedge resection RLL due to stage 1b NSCLC.  PMH:  colon CA, tobacco abuse, depression/anxiety   Clinical Impression  Pt is at or close to baseline functioning and should be safe at home with available set up assist. There are no further acute PT needs.  Will sign off at this time.    Follow Up Recommendations No PT follow up    Equipment Recommendations  None recommended by PT    Recommendations for Other Services       Precautions / Restrictions Precautions Precautions: None Restrictions Weight Bearing Restrictions: No      Mobility  Bed Mobility Overal bed mobility: Modified Independent                Transfers Overall transfer level: Modified independent Equipment used: None                Ambulation/Gait Ambulation/Gait assistance: Modified independent (Device/Increase time) Ambulation Distance (Feet): 250 Feet Assistive device: None Gait Pattern/deviations: WFL(Within Functional Limits) Gait velocity: slower      Stairs Stairs: Yes Stairs assistance: Modified independent (Device/Increase time) Stair Management: One rail Right;Alternating pattern;Forwards Number of Stairs: 4 General stair comments: steady and safe with rail  Wheelchair Mobility    Modified Rankin (Stroke Patients Only)       Balance Overall balance assessment: No apparent balance deficits (not formally assessed)                                           Pertinent Vitals/Pain      Home Living Family/patient expects to be discharged to:: Private residence Living Arrangements: Alone Available Help at Discharge: Friend(s);Family;Available PRN/intermittently Type of Home: House Home Access: Stairs to enter Entrance Stairs-Rails: Counsellor of Steps: 4 Home Layout: One level Home Equipment: None      Prior Function Level of Independence: Independent         Comments: denies falls.  She drives and is independent with IADLs      Hand Dominance   Dominant Hand: Right    Extremity/Trunk Assessment   Upper Extremity Assessment: Overall WFL for tasks assessed           Lower Extremity Assessment: Overall WFL for tasks assessed      Cervical / Trunk Assessment: Normal  Communication   Communication: No difficulties  Cognition Arousal/Alertness: Awake/alert Behavior During Therapy: WFL for tasks assessed/performed Overall Cognitive Status: Within Functional Limits for tasks assessed                      General Comments General comments (skin integrity, edema, etc.): noticeable DOE, able to keep talking, SpO2 maintained into 90's on RA    Exercises        Assessment/Plan    PT Assessment Patent does not need any further PT services  PT Diagnosis Other (comment) (decr activity tolerance)   PT Problem List    PT Treatment Interventions     PT Goals (Current goals can be found in the Care Plan section) Acute Rehab PT Goals Patient Stated Goal: to go home  PT Goal Formulation: All assessment and education complete, DC therapy    Frequency  Barriers to discharge        Co-evaluation               End of Session   Activity Tolerance: Patient tolerated treatment well Patient left: in bed;with call bell/phone within reach Nurse Communication: Mobility status         Time: 3794-4461 PT Time Calculation (min) (ACUTE ONLY): 15 min   Charges:   PT Evaluation $Initial PT Evaluation Tier I: 1 Procedure     PT G Codes:        Tallis Soledad, Tessie Fass 10/13/2014, 11:41 AM 10/13/2014  Donnella Sham, [place] [phone]  (pager)

## 2014-10-22 ENCOUNTER — Other Ambulatory Visit: Payer: Self-pay | Admitting: *Deleted

## 2014-10-22 DIAGNOSIS — G8918 Other acute postprocedural pain: Secondary | ICD-10-CM

## 2014-10-22 MED ORDER — HYDROCODONE-ACETAMINOPHEN 5-325 MG PO TABS: 1.0000 | ORAL_TABLET | Freq: Four times a day (QID) | ORAL | Status: DC | PRN

## 2014-10-23 ENCOUNTER — Encounter: Payer: Self-pay | Admitting: *Deleted

## 2014-10-23 NOTE — [facility] Oncology Navigator Note (Signed)
Dr. Benay Spice requesting MLH1 methylation testing on the following case:   REPORT OF SURGICAL PATHOLOGY  Case #: [id number] Patient Name: Kelli [name], Kelli [name] Office Chart Number: N/A  MRN: [id number] Pathologist: Jaquelyn Bitter A. Rodney Cruise, MD DOB/Age [birth year] (Age: 69) Gender: F Date Taken: 02/15/2009 Date Received: 02/16/2009  Email to pathology.

## 2014-10-29 ENCOUNTER — Other Ambulatory Visit: Payer: Self-pay | Admitting: Thoracic Surgery (Cardiothoracic Vascular Surgery)

## 2014-10-29 DIAGNOSIS — C3430 Malignant neoplasm of lower lobe, unspecified bronchus or lung: Secondary | ICD-10-CM

## 2014-11-03 ENCOUNTER — Encounter: Payer: Self-pay | Admitting: Thoracic Surgery (Cardiothoracic Vascular Surgery)

## 2014-11-03 ENCOUNTER — Ambulatory Visit (INDEPENDENT_AMBULATORY_CARE_PROVIDER_SITE_OTHER): Payer: Self-pay | Admitting: Thoracic Surgery (Cardiothoracic Vascular Surgery)

## 2014-11-03 ENCOUNTER — Ambulatory Visit
Admission: RE | Admit: 2014-11-03 | Discharge: 2014-11-03 | Disposition: A | Discharge status: Home / Self Care | Payer: Commercial Managed Care - HMO | Source: Ambulatory Visit | Attending: Thoracic Surgery (Cardiothoracic Vascular Surgery) | Admitting: Thoracic Surgery (Cardiothoracic Vascular Surgery)

## 2014-11-03 VITALS — BP 115/73 | HR 79 | Resp 20 | Ht 69.0 in | Wt 185.0 lb

## 2014-11-03 DIAGNOSIS — G8918 Other acute postprocedural pain: Secondary | ICD-10-CM

## 2014-11-03 DIAGNOSIS — C3491 Malignant neoplasm of unspecified part of right bronchus or lung: Secondary | ICD-10-CM

## 2014-11-03 DIAGNOSIS — Z9889 Other specified postprocedural states: Secondary | ICD-10-CM

## 2014-11-03 DIAGNOSIS — C3430 Malignant neoplasm of lower lobe, unspecified bronchus or lung: Secondary | ICD-10-CM

## 2014-11-03 MED ORDER — HYDROCODONE-ACETAMINOPHEN 5-325 MG PO TABS: 1.0000 | ORAL_TABLET | Freq: Four times a day (QID) | ORAL | Status: DC | PRN

## 2014-11-03 NOTE — Progress Notes (Signed)
[address]       [place] [address]             [phone]       HPI:  Kelli [name] returns today for a scheduled postop follow up visit.  She is a 69 yo woman who had a thoracoscopic right lower lobe basilar segmentectomy on 10/06/2014. She turned out to have a stage IA adenocarcinoma.  Postoperative course was unremarkable.  She initially did not have much pain. We had done cryo-analgesia intraoperatively. About a week after she went home she started having some incisional pain. Oxycodone was ineffective, but hydrocodone did improve her pain. She currently is taking 2 hydrocodone tablets twice a day.  She says that she has some good days and bad days. She is starting to feel less fatigued. She has not had any shortness of breath.  Past Medical History  Diagnosis Date  . H/O colon cancer, stage II 09/19/2011    T3N0  Cecum Sept 2010  . Lung nodules 09/19/2011    granulomas  . Depression with anxiety 09/19/2011  . Hyperlipidemia 09/19/2011  . Colon cancer     colon ca dx 02/2009  . Urinary urgency   . Depression   . High cholesterol   . Anxiety       Current Outpatient Prescriptions  Medication Sig Dispense Refill  . aspirin 81 MG tablet Take 81 mg by mouth daily.    [name] b complex vitamins tablet Take 1 tablet by mouth daily.    . Cholecalciferol (VITAMIN D) 2000 UNITS tablet Take 2,000 Units by mouth daily.    [name] FLUoxetine (PROZAC) 20 MG capsule Take 2 capsules (40 mg total) by mouth daily. 180 capsule 1  . HYDROcodone-acetaminophen (NORCO/VICODIN) 5-325 MG per tablet Take 1-2 tablets by mouth every 6 (six) hours as needed for moderate pain. 50 tablet 0  . pravastatin (PRAVACHOL) 40 MG tablet Take 1 tablet (40 mg total) by mouth daily. 90 tablet 3   No current facility-administered medications for this visit.    Physical Exam BP 115/73 mmHg  Pulse 79  Resp 20  Ht '5\' 9"'$  (1.753 m)  Wt 185 lb (83.915 kg)  BMI 27.31 kg/m2  SpO42 4% 69 year old  woman in no acute distress Alert and oriented 3 with no focal deficits Cardiac regular rate and rhythm normal S1 and S2 Lungs diminished breath sounds right base, otherwise clear Incisions healing well  Diagnostic Tests: Chest x-ray shows postoperative changes of a basilar segmentectomy on the right  Impression: 69 year old woman who is now a month out from a right lower lobe basilar segmentectomy for stage IA non-small cell carcinoma. She also has a history of colon cancer for which she is followed by Dr. Julieanne Manson.  She is still having some incisional pain which is not surprising this early on. I gave her another prescription for hydrocodone/acetaminophen 5/325, 50 tablets, no refills, 1-2 tablets twice daily as needed for pain.  She can begin driving on a limited basis but not within 4 hours of taking the pain medication. When she is only taking the pain medication at night she can begin driving on a limited basis.  Her activities are otherwise unrestricted.  She needs a follow-up appointment with Dr. Benay Spice  Plan: Follow up with Dr. Benay Spice  I will see her back in 2 months with a PA and Lateral CXR  [name], MD [facility] [phone]

## 2014-11-06 ENCOUNTER — Telehealth: Payer: Self-pay | Admitting: Oncology

## 2014-11-06 ENCOUNTER — Telehealth: Payer: Self-pay | Admitting: *Deleted

## 2014-11-06 NOTE — Telephone Encounter (Signed)
Pt called to schedule a f/u with Dr. Benay Spice per Dr. Roxan Hockey from a lump removal.... Pt confirmed d/t.... KJ

## 2014-11-06 NOTE — Telephone Encounter (Signed)
VM message from pt requesting an appt with Dr. Benay Spice s/p lung surgery done on 10/05/14.

## 2014-11-09 NOTE — Telephone Encounter (Signed)
Ok to f/u with me next 1 month 3mn. appt.

## 2014-11-09 NOTE — Telephone Encounter (Signed)
Schedule next 1 month 13mn.

## 2014-11-10 ENCOUNTER — Encounter: Payer: Self-pay | Admitting: *Deleted

## 2014-11-12 ENCOUNTER — Other Ambulatory Visit: Payer: Self-pay

## 2014-11-12 DIAGNOSIS — G8918 Other acute postprocedural pain: Secondary | ICD-10-CM

## 2014-11-12 MED ORDER — HYDROCODONE-ACETAMINOPHEN 5-325 MG PO TABS
1.0000 | ORAL_TABLET | Freq: Four times a day (QID) | ORAL | Status: DC | PRN
Start: 2014-11-12 — End: 2014-11-19

## 2014-11-12 NOTE — Telephone Encounter (Signed)
RX for Norco 5/325 mg printed out and Dr Servando Snare will sign. Pt will pick up today at 12 noon.

## 2014-11-19 ENCOUNTER — Other Ambulatory Visit: Payer: Self-pay | Admitting: *Deleted

## 2014-11-19 DIAGNOSIS — G8918 Other acute postprocedural pain: Secondary | ICD-10-CM

## 2014-11-19 MED ORDER — HYDROCODONE-ACETAMINOPHEN 7.5-325 MG PO TABS
1.0000 | ORAL_TABLET | Freq: Four times a day (QID) | ORAL | Status: DC | PRN
Start: 2014-11-19 — End: 2014-12-03

## 2014-11-19 NOTE — Telephone Encounter (Signed)
Ms. Schnarr called to get a refill for her Norco 5/325 mg.  She relates that she takes two to work, but they only last barely three hours. Dr. Roxan Hockey had really wanted her to not take them but every six hours but she becomes so uncomfortable.  I consulted him to see if we could increase her dosage to 7.'5mg'$  and he agreed. I informed her that a new signed RX with increased dosage would be available today.

## 2014-11-27 ENCOUNTER — Telehealth: Payer: Self-pay | Admitting: Oncology

## 2014-11-27 ENCOUNTER — Ambulatory Visit (HOSPITAL_BASED_OUTPATIENT_CLINIC_OR_DEPARTMENT_OTHER): Payer: Commercial Managed Care - HMO | Admitting: Oncology

## 2014-11-27 VITALS — BP 125/69 | HR 80 | Temp 98.7°F | Resp 18 | Ht 69.0 in | Wt 169.9 lb

## 2014-11-27 DIAGNOSIS — Z87891 Personal history of nicotine dependence: Secondary | ICD-10-CM

## 2014-11-27 DIAGNOSIS — C3431 Malignant neoplasm of lower lobe, right bronchus or lung: Secondary | ICD-10-CM | POA: Diagnosis not present

## 2014-11-27 NOTE — Telephone Encounter (Signed)
Gave patient avs report and appointment for April 2017. Per 11/27/14 pof f/u a few days after ct. Expected date for ct is 09/06/2015. Central radiology scheduling will call patient with ct - patient aware.

## 2014-11-27 NOTE — Progress Notes (Addendum)
  [place] OFFICE PROGRESS NOTE   Diagnosis: Non-small cell lung cancer  INTERVAL HISTORY:   Kelli [name] returns for scheduled visit. She was discharged from the oncology clinic when I saw her earlier this year. She was referred for a screening chest CT on 09/03/2014. A spiculated nodule was noted in the right lower lung, slightly enlarged compared to 2012.  A PET scan on 09/16/2014 confirmed low level FDG activity associated with the right lower lobe nodule.  She was referred to Dr. Roxan Hockey and was taken to the operating [facility] 10/05/2014 for a review assisted thoracoscopy and wedge resection of the right lower lobe mass. A right lower lobe basilar segmentectomy and B-cell lymph node dissection were performed.  The pathology [id number]) confirmed a minimally invasive well-differentiated adenocarcinoma measuring 1.7 cm. A separate 0.3 similar focus of atypical adenomatous hyperplasia was noted. The bronchial and vascular margins were negative for tumor. Multiple lymph nodes were negative for metastatic carcinoma.  She continues to have discomfort at the thoracotomy site relieved with 3 hydrocodone tablets per day. Mild exertional dyspnea.  Objective:  Vital signs in last 24 hours:  Blood pressure 125/69, pulse 80, temperature 98.7 F (37.1 C), temperature source Oral, resp. rate 18, height $RemoveBe'5\' 9"'ZSMruhLAx$  (1.753 m), weight 169 lb 14.4 oz (77.066 kg), SpO2 98 %.    HEENT: Neck without mass Lymphatics: No cervical, supra-clavicular, or axillary nodes Resp: Lungs clear bilaterally, no respiratory distress Cardio: Regular rate and rhythm GI: No hepatomegaly Vascular: No leg edema Musculoskeletal: The area of chest discomfort is at the level of the right thoracotomy scar. No evidence for infection.      Medications: I have reviewed the patient's current medications.  Assessment/Plan: 1. Stage II (T3 N0) adenocarcinoma the cecum diagnosed in September 2010, status post  adjuvant Xeloda  MSI-high, BRAF mutation detected 2. History of colonic polyps-status post a surveillance colonoscopy in February 2016 with a tubular adenoma removed 3. History of heavy tobacco use, quit in 2010 4. History of lung nodules-last imaging was an abdomen CT in October 2013 prior to repeat imaging 09/03/2014  CT 09/03/2014 revealed enlargement of a spiculated right lower lobe nodule  PET scan 09/16/2014 revealed low-level FDG activity associated with the right lower lobe spiculated nodule  Status post a right lower lobe basilar segmentectomy and B-cell lymph node dissection on 10/06/2014 with the pathology confirming a minimally invasive well-differentiated adenocarcinoma,pT1a,N0, invasive component measured less than 0.3 cm, with negative surgical margins  5.  Post thoracotomy pain    Disposition:  Kelli [name] has been diagnosed with a minimally invasive well-differentiated adenocarcinoma of the right lower lobe. She underwent a segmentectomy and lymph node dissection and has pathologic stage IA disease. There is no indication for adjuvant therapy. She has a good prognosis. I will not submit EGFR and ALK testing since this will not change her present therapy.  Kelli [name] has an extensive smoking history. She will be scheduled for a surveillance CT of the chest in approximately 9 months. She will return for an office visit after the chest CT. She will follow-up with Dr. Roxan Hockey in the interim.  Betsy Coder, MD  11/27/2014  11:05 AM

## 2014-11-30 ENCOUNTER — Other Ambulatory Visit: Payer: Self-pay

## 2014-12-03 ENCOUNTER — Other Ambulatory Visit: Payer: Self-pay

## 2014-12-03 DIAGNOSIS — G8918 Other acute postprocedural pain: Secondary | ICD-10-CM

## 2014-12-03 MED ORDER — HYDROCODONE-ACETAMINOPHEN 7.5-325 MG PO TABS
1.0000 | ORAL_TABLET | Freq: Four times a day (QID) | ORAL | Status: DC | PRN
Start: 2014-12-03 — End: 2014-12-22

## 2014-12-03 NOTE — Telephone Encounter (Signed)
RX for Norco 7.5/325 mg refilled, Patient will pick up at front desk.

## 2014-12-17 ENCOUNTER — Encounter: Payer: Commercial Managed Care - HMO | Admitting: Family Medicine

## 2014-12-17 ENCOUNTER — Other Ambulatory Visit: Payer: Self-pay | Admitting: Family Medicine

## 2014-12-22 ENCOUNTER — Other Ambulatory Visit: Payer: Self-pay

## 2014-12-22 DIAGNOSIS — G8918 Other acute postprocedural pain: Secondary | ICD-10-CM

## 2014-12-22 MED ORDER — HYDROCODONE-ACETAMINOPHEN 7.5-325 MG PO TABS
1.0000 | ORAL_TABLET | Freq: Four times a day (QID) | ORAL | Status: DC | PRN
Start: 2014-12-22 — End: 2014-12-22

## 2014-12-22 MED ORDER — HYDROCODONE-ACETAMINOPHEN 7.5-325 MG PO TABS
1.0000 | ORAL_TABLET | Freq: Four times a day (QID) | ORAL | Status: DC | PRN
Start: 2014-12-22 — End: 2015-01-12

## 2014-12-22 NOTE — Telephone Encounter (Signed)
REfill signed by Dr Cyndia Bent

## 2014-12-22 NOTE — Telephone Encounter (Signed)
RX for Norco 7.5/325 mg  #40 refilled

## 2014-12-29 ENCOUNTER — Encounter: Payer: Self-pay | Admitting: Physician Assistant

## 2014-12-29 ENCOUNTER — Ambulatory Visit (INDEPENDENT_AMBULATORY_CARE_PROVIDER_SITE_OTHER): Payer: Commercial Managed Care - HMO | Admitting: Emergency Medicine

## 2014-12-29 ENCOUNTER — Ambulatory Visit (INDEPENDENT_AMBULATORY_CARE_PROVIDER_SITE_OTHER): Payer: Commercial Managed Care - HMO

## 2014-12-29 VITALS — BP 116/68 | HR 91 | Temp 98.3°F | Resp 16 | Wt 168.2 lb

## 2014-12-29 DIAGNOSIS — C3431 Malignant neoplasm of lower lobe, right bronchus or lung: Secondary | ICD-10-CM

## 2014-12-29 DIAGNOSIS — R7302 Impaired glucose tolerance (oral): Secondary | ICD-10-CM

## 2014-12-29 DIAGNOSIS — L989 Disorder of the skin and subcutaneous tissue, unspecified: Secondary | ICD-10-CM | POA: Diagnosis not present

## 2014-12-29 DIAGNOSIS — E785 Hyperlipidemia, unspecified: Secondary | ICD-10-CM | POA: Diagnosis not present

## 2014-12-29 DIAGNOSIS — L821 Other seborrheic keratosis: Secondary | ICD-10-CM

## 2014-12-29 DIAGNOSIS — Z85038 Personal history of other malignant neoplasm of large intestine: Secondary | ICD-10-CM

## 2014-12-29 DIAGNOSIS — M79641 Pain in right hand: Secondary | ICD-10-CM | POA: Diagnosis not present

## 2014-12-29 DIAGNOSIS — R238 Other skin changes: Secondary | ICD-10-CM

## 2014-12-29 DIAGNOSIS — Z1239 Encounter for other screening for malignant neoplasm of breast: Secondary | ICD-10-CM | POA: Diagnosis not present

## 2014-12-29 NOTE — Progress Notes (Signed)
[facility] [address] [place] [address] [phone]  Date:  12/29/2014   Name:  Kelli [name]   DOB:  Oct 22, 1945   MRN:  [id number]  PCP:  Ellsworth Lennox, MD    Chief Complaint: right hand pain and establish care   History of Present Illness:  This is a 69 y.o. female with PMH stage 2 colon cancer dx'd 2010, right lobe lung cancer s/p wedge resection 10/2014, depression with anxiety and HLD who is presenting with right hand pain x 1 week. Pain is described as throbbing pain. Located to MCP joint on palmar surface. No swelling, redness, decreased movement or paresthesias. No other joint pain. She takes hydrocodone twice a day for her right chest pain s/p surgery but no help.   Pt had right lower lobe resection d/t lung cancer. She had known nodules that were being followed by CT scan. Recent CT scan showed growth from previous years. Pathology positive for adenocarcinoma. Required only surgery - negative surgical margins noted. Pt still with pain on right side. She has been weaning herself off hydrocodone. She is down to 2 tablets per day. Pt has not had a mammogram since 2012 but did have a PET scan 3 months ago. She had colon cancer dx'd 2010 treated with surgery and chemotherapy. Last colonoscopy 07/2014 showed a tubular adenomatous polyp. Follow up colonoscopy in 5 years.  Pt complains of multiple moles on her trunk. She has one on her right shoulder that is irritating. She bra strap rubs and becomes irritated and itchy. She is wondering if we can remove it.  She takes pravachol for HLD. It has been over 1 year since last lipid panel. She will return in 2 days for fasting lab work.  Hgb A1C 6.9 one year ago. Will obtain new A1C when she returns for blood work.  Review of Systems:  Review of Systems See HPI  Patient Active Problem List   Diagnosis Date Noted  . Lung cancer, lower lobe 10/09/2014  . Hematochezia 04/01/2013  . H/O colon cancer, stage  II 09/19/2011  . Depression with anxiety 09/19/2011  . Hyperlipidemia 09/19/2011    Prior to Admission medications   Medication Sig Start Date End Date Taking? Authorizing Provider  aspirin 81 MG tablet Take 81 mg by mouth daily.   Yes Historical Provider, MD  b complex vitamins tablet Take 1 tablet by mouth daily.   Yes Historical Provider, MD  Cholecalciferol (VITAMIN D) 2000 UNITS tablet Take 2,000 Units by mouth daily.   Yes Historical Provider, MD  FLUoxetine (PROZAC) 20 MG capsule Take 2 capsules (40 mg total) by mouth daily. 06/17/14  Yes Barton Fanny, MD  HYDROcodone-acetaminophen ([place]) 7.5-325 MG per tablet Take 1 tablet by mouth every 6 (six) hours as needed for moderate pain. 12/22/14  Yes Gaye Pollack, MD  pravastatin (PRAVACHOL) 40 MG tablet TAKE 1 TABLET BY MOUTH EVERY DAY 12/18/14  Yes Chelle Jeffery, PA-C    Allergies  Allergen Reactions  . Oxycodone Other (See Comments)    Does not work- Hydrocodone does work    Past Surgical History  Procedure Laterality Date  . Colon surgery      2010  . Partial colectomy Right 2010  . Breast surgery  1978    augmentation  . Appendectomy    . Video assisted thoracoscopy (vats)/wedge resection Right 10/05/2014    Procedure: Right VIDEO ASSISTED THORACOSCOPY (VATS) with right lower lobe lung nodule WEDGE RESECTION;  Surgeon: [name], MD;  Location: [place];  Service: Thoracic;  Laterality: Right;  . Segmentecomy Right 10/05/2014    Procedure: right lower lobe SEGMENTECTOMY;  Surgeon: [name], MD;  Location: [place];  Service: Thoracic;  Laterality: Right;  . Cryo intercostal nerve block N/A 10/05/2014    Procedure: CRYO INTERCOSTAL NERVE BLOCK;  Surgeon: [name], MD;  Location: [place];  Service: Thoracic;  Laterality: N/A;  . Lymph node dissection Right 10/05/2014    Procedure: LYMPH NODE DISSECTION;  Surgeon: [name], MD;  Location: [place];  Service: Thoracic;  Laterality: Right;     History  Substance Use Topics  . Smoking status: Former Smoker -- 3.00 packs/day for 40 years    Types: Cigarettes    Quit date: 02/06/2009  . Smokeless tobacco: Never Used  . Alcohol Use: 0.0 oz/week    0 Standard drinks or equivalent per week     Comment: 3-4 times a week 1-2 beers    Family History  Problem Relation Age of Onset  . Heart disease Mother   . Heart disease Brother     Medication list has been reviewed and updated.  Physical Examination:  Physical Exam  Constitutional: She is oriented to person, place, and time. She appears well-developed and well-nourished. No distress.  HENT:  Head: Normocephalic and atraumatic.  Right Ear: Hearing normal.  Left Ear: Hearing normal.  Nose: Nose normal.  Eyes: Conjunctivae and lids are normal. Right eye exhibits no discharge. Left eye exhibits no discharge. No scleral icterus.  Neck: Trachea normal. Carotid bruit is not present.  Cardiovascular: Normal rate, regular rhythm, normal heart sounds and normal pulses.   No murmur heard. Pulmonary/Chest: Effort normal and breath sounds normal. No respiratory distress. She has no wheezes. She has no rhonchi. She has no rales.  Musculoskeletal: Normal range of motion.       Right hand: She exhibits normal range of motion, no tenderness, no bony tenderness, normal capillary refill and no swelling. Normal sensation noted. Normal strength noted.       Left hand: Normal.  Pain located to right third MCP joint but not ttp.  Neurological: She is alert and oriented to person, place, and time.  Skin: Skin is warm, dry and intact.  >30 seborrheic keratoses over upper back and abdomen.  One SK on upper right shoulder pt states is irritating and pruritic.  Psychiatric: She has a normal mood and affect. Her speech is normal and behavior is normal. Thought content normal.   BP 116/68 mmHg  Pulse 91  Temp(Src) 98.3 F (36.8 C) (Oral)  Resp 16  Wt 168 lb 3.2 oz (76.295 kg)  [facility]  reading (PRIMARY) by  Dr. Everlene Farrier: negative  Assessment and Plan:  1. Hand pain, right Radiograph negative. Hand appears normal. Pain only been going on for one week. Suggested anti-inflammatories for next 1-2 weeks to see if pain resolves. Return if pain not improving in 2 weeks. - DG Hand Complete Right; Future  2. Glucose intolerance (impaired glucose tolerance) Last A1C one year ago 6.9. Return in 2 days for blood work. - Hemoglobin A1c; Future  3. Hyperlipidemia Return in 2 days for fasting blood work. Call in 3 months when need a refill of pravachol. - Comprehensive metabolic panel; Future - Lipid panel; Future  4. Malignant neoplasm of lower lobe of right lung 5. H/O colon cancer, stage II Recent colonoscopy with TA. Next colonoscopy in 5 years. Clean margins on  right lower lobe web resection in May 2016. Still right sided chest pain. Weaning off hydrocodone. Follow up with oncology.  6. Seborrheic keratoses 7. Skin irritation Multiple SKs over trunk. SK on right shoulder bothering pt and she wants removed. She will return 7/29 to UC for removal.  8. Breast cancer screening Will call radiologist to determine if recent pet scan will suffice for mammogram this year. If not, pt would like to wait a few months since still having right chest pain from recent right lung wedge resection.   Benjaman Pott Drenda Freeze, MHS [facility]  12/30/2014

## 2014-12-31 ENCOUNTER — Ambulatory Visit (INDEPENDENT_AMBULATORY_CARE_PROVIDER_SITE_OTHER): Payer: Commercial Managed Care - HMO | Admitting: Physician Assistant

## 2014-12-31 VITALS — BP 118/62 | HR 76 | Temp 97.8°F | Resp 14 | Ht 69.0 in | Wt 169.8 lb

## 2014-12-31 DIAGNOSIS — E119 Type 2 diabetes mellitus without complications: Secondary | ICD-10-CM

## 2014-12-31 DIAGNOSIS — R7302 Impaired glucose tolerance (oral): Secondary | ICD-10-CM

## 2014-12-31 DIAGNOSIS — L821 Other seborrheic keratosis: Secondary | ICD-10-CM

## 2014-12-31 DIAGNOSIS — E785 Hyperlipidemia, unspecified: Secondary | ICD-10-CM

## 2014-12-31 DIAGNOSIS — L918 Other hypertrophic disorders of the skin: Secondary | ICD-10-CM | POA: Diagnosis not present

## 2014-12-31 HISTORY — DX: Type 2 diabetes mellitus without complications: E11.9

## 2014-12-31 LAB — COMPREHENSIVE METABOLIC PANEL
ALT: 19 U/L (ref 6–29)
AST: 19 U/L (ref 10–35)
Albumin: 4.2 g/dL (ref 3.6–5.1)
Alkaline Phosphatase: 66 U/L (ref 33–130)
BILIRUBIN TOTAL: 0.5 mg/dL (ref 0.2–1.2)
BUN: 13 mg/dL (ref 7–25)
CO2: 26 mEq/L (ref 20–31)
Calcium: 9.4 mg/dL (ref 8.6–10.4)
Chloride: 103 mEq/L (ref 98–110)
Creat: 0.76 mg/dL (ref 0.50–0.99)
GLUCOSE: 134 mg/dL — AB (ref 65–99)
POTASSIUM: 4.3 meq/L (ref 3.5–5.3)
Sodium: 140 mEq/L (ref 135–146)
TOTAL PROTEIN: 6.6 g/dL (ref 6.1–8.1)

## 2014-12-31 LAB — LIPID PANEL
CHOL/HDL RATIO: 5.7 ratio — AB (ref <=5.0)
Cholesterol: 216 mg/dL — ABNORMAL HIGH (ref 125–200)
HDL: 38 mg/dL — ABNORMAL LOW (ref >=46)
LDL Cholesterol: 125 mg/dL (ref <130)
Triglycerides: 264 mg/dL — ABNORMAL HIGH (ref <150)
VLDL: 53 mg/dL — AB (ref <30)

## 2014-12-31 LAB — POCT GLYCOSYLATED HEMOGLOBIN (HGB A1C): HEMOGLOBIN A1C: 6.7

## 2014-12-31 NOTE — Patient Instructions (Signed)
Leave dressings in place for 24 hours. After that you may get it wet in the showed.  Do not scrub or apply any ointments. Return with further problems/concerns. Call in 3 months and I will refill your pravachol. Watch white foods in your diet - pasta, rice, bread, potatoes, sugary beverages, sweets. Return to see me in 6 months for follow up. I will call you with results of your lab tests.

## 2014-12-31 NOTE — Progress Notes (Signed)
[facility] [address], [place] [address] [phone]  Date:  12/31/2014   Name:  Kelli [name]   DOB:  1945/10/16   MRN:  [id number]  PCP:  Ellsworth Lennox, MD    Chief Complaint: Labs Only; skin tag removal; and Medication Refill   History of Present Illness:  This is a 69 y.o. female with PMH colon and lung cancer, HLD, depression with anxiety who is presenting to have an SK from her right shoulder removed. I saw patient 2 days ago at 102 for hand pain. She noted at that time to have a skin lesion on her right shoulder and right neck that was irritated and itchy. She has multiple >30 SKs on her upper back and abdomen.  Pt also getting blood work today - future orders in.  Review of Systems:  Review of Systems See HPI  Patient Active Problem List   Diagnosis Date Noted  . Lung cancer, lower lobe 10/09/2014  . Hematochezia 04/01/2013  . H/O colon cancer, stage II 09/19/2011  . Depression with anxiety 09/19/2011  . Hyperlipidemia 09/19/2011    Prior to Admission medications   Medication Sig Start Date End Date Taking? Authorizing Provider  aspirin 81 MG tablet Take 81 mg by mouth daily.   Yes Historical Provider, MD  b complex vitamins tablet Take 1 tablet by mouth daily.   Yes Historical Provider, MD  Cholecalciferol (VITAMIN D) 2000 UNITS tablet Take 2,000 Units by mouth daily.   Yes Historical Provider, MD  FLUoxetine (PROZAC) 20 MG capsule Take 2 capsules (40 mg total) by mouth daily. 06/17/14  Yes Barton Fanny, MD  HYDROcodone-acetaminophen ([place]) 7.5-325 MG per tablet Take 1 tablet by mouth every 6 (six) hours as needed for moderate pain. 12/22/14  Yes Gaye Pollack, MD  pravastatin (PRAVACHOL) 40 MG tablet TAKE 1 TABLET BY MOUTH EVERY DAY 12/18/14  Yes Chelle Jeffery, PA-C    Allergies  Allergen Reactions  . Oxycodone Other (See Comments)    Does not work- Hydrocodone does work    Past Surgical History  Procedure Laterality  Date  . Colon surgery      2010  . Partial colectomy Right 2010  . Breast surgery  1978    augmentation  . Appendectomy    . Video assisted thoracoscopy (vats)/wedge resection Right 10/05/2014    Procedure: Right VIDEO ASSISTED THORACOSCOPY (VATS) with right lower lobe lung nodule WEDGE RESECTION;  Surgeon: [name], MD;  Location: [place];  Service: Thoracic;  Laterality: Right;  . Segmentecomy Right 10/05/2014    Procedure: right lower lobe SEGMENTECTOMY;  Surgeon: [name], MD;  Location: [place];  Service: Thoracic;  Laterality: Right;  . Cryo intercostal nerve block N/A 10/05/2014    Procedure: CRYO INTERCOSTAL NERVE BLOCK;  Surgeon: [name], MD;  Location: [place];  Service: Thoracic;  Laterality: N/A;  . Lymph node dissection Right 10/05/2014    Procedure: LYMPH NODE DISSECTION;  Surgeon: [name], MD;  Location: [place];  Service: Thoracic;  Laterality: Right;    History  Substance Use Topics  . Smoking status: Former Smoker -- 3.00 packs/day for 40 years    Types: Cigarettes    Quit date: 02/06/2009  . Smokeless tobacco: Never Used  . Alcohol Use: 0.0 oz/week    0 Standard drinks or equivalent per week     Comment: 3-4 times a week 1-2 beers    Family History  Problem  Relation Age of Onset  . Heart disease Mother   . Heart disease Brother     Medication list has been reviewed and updated.  Physical Examination:  Physical Exam  Constitutional: She is oriented to person, place, and time. She appears well-developed and well-nourished. No distress.  HENT:  Head: Normocephalic and atraumatic.  Right Ear: Hearing normal.  Left Ear: Hearing normal.  Nose: Nose normal.  Eyes: Conjunctivae and lids are normal. Right eye exhibits no discharge. Left eye exhibits no discharge. No scleral icterus.  Pulmonary/Chest: Effort normal. No respiratory distress.  Musculoskeletal: Normal range of motion.  Neurological: She is alert and oriented to  person, place, and time.  Skin: Skin is warm, dry and intact. Lesion noted.  1 SK on right shoulder and 1 on right side of neck. >30 over upper back and abdomen Skin tag on left neck  Psychiatric: She has a normal mood and affect. Her speech is normal and behavior is normal. Thought content normal.   BP 118/62 mmHg  Pulse 76  Temp(Src) 97.8 F (36.6 C) (Oral)  Resp 14  Ht '5\' 9"'$  (1.753 m)  Wt 169 lb 12.8 oz (77.021 kg)  BMI 25.06 kg/m2  SpO2 98%  1 skin tags on left neck snipped off using alcohol for cleansing and sterile iris scissors. Local anesthesia was not used. These pathognomonic lesions are not sent for pathology.  Procedure: Verbal consent obtained. Skin cleaned with alcohol. Skin was anesthetized with 1 cc 1% lido with epi at base of both SKs to be removed.  Shave removal performed. These pathognomonic lesions were not sent for pathology.   Assessment and Plan:  1. Hyperlipidemia - Comprehensive metabolic panel - Lipid panel  2. Diabetes mellitus type 2, uncomplicated 3. Glucose intolerance (impaired glucose tolerance) A1C 6.7 down from 6.9 one year ago. Will not start medication at this time. She will watch white foods in diet. Return in 6 months for recheck. - POCT glycosylated hemoglobin (Hb A1C)  4. Seborrheic keratoses 2 SKs removed by shave biopsy. Pathology not sent. Counseled on wound care.  5. Skin tag 1 skin tag removed.   Benjaman Pott Drenda Freeze, MHS [facility]  12/31/2014

## 2015-01-05 ENCOUNTER — Ambulatory Visit: Payer: Commercial Managed Care - HMO | Admitting: Thoracic Surgery (Cardiothoracic Vascular Surgery)

## 2015-01-11 ENCOUNTER — Other Ambulatory Visit: Payer: Self-pay | Admitting: Thoracic Surgery (Cardiothoracic Vascular Surgery)

## 2015-01-11 DIAGNOSIS — C3430 Malignant neoplasm of lower lobe, unspecified bronchus or lung: Secondary | ICD-10-CM

## 2015-01-12 ENCOUNTER — Ambulatory Visit (INDEPENDENT_AMBULATORY_CARE_PROVIDER_SITE_OTHER): Payer: Commercial Managed Care - HMO | Admitting: Thoracic Surgery (Cardiothoracic Vascular Surgery)

## 2015-01-12 ENCOUNTER — Ambulatory Visit
Admission: RE | Admit: 2015-01-12 | Discharge: 2015-01-12 | Disposition: A | Discharge status: Home / Self Care | Payer: Commercial Managed Care - HMO | Source: Ambulatory Visit | Attending: Thoracic Surgery (Cardiothoracic Vascular Surgery) | Admitting: Thoracic Surgery (Cardiothoracic Vascular Surgery)

## 2015-01-12 ENCOUNTER — Encounter: Payer: Self-pay | Admitting: Thoracic Surgery (Cardiothoracic Vascular Surgery)

## 2015-01-12 VITALS — BP 117/66 | HR 74 | Resp 20 | Ht 69.0 in | Wt 170.0 lb

## 2015-01-12 DIAGNOSIS — Z9889 Other specified postprocedural states: Secondary | ICD-10-CM

## 2015-01-12 DIAGNOSIS — C3491 Malignant neoplasm of unspecified part of right bronchus or lung: Secondary | ICD-10-CM | POA: Diagnosis not present

## 2015-01-12 DIAGNOSIS — C3430 Malignant neoplasm of lower lobe, unspecified bronchus or lung: Secondary | ICD-10-CM

## 2015-01-12 MED ORDER — HYDROCODONE-ACETAMINOPHEN 5-325 MG PO TABS: 1.0000 | ORAL_TABLET | Freq: Four times a day (QID) | ORAL | Status: DC | PRN

## 2015-01-12 NOTE — Progress Notes (Signed)
[address]       [place] [address]             [phone]       HPI: Mrs. Arenas returns today for a scheduled 3 month follow-up visit.  She is a 69 yo woman who had a thoracoscopic right lower lobe basilar segmentectomy on 10/06/2014 for a stage IA adenocarcinoma.  Her postoperative course was unremarkable.  We did cryo-analgesia intraoperatively. She initially did not have much pain, but about a week after she went home she started having incisional pain. Oxycodone was ineffective, but hydrocodone did help. She currently is taking one half of a 7.5 mg tablet once or twice a day.  She saw Dr. Benay Spice. He did not recommend any adjuvant chemotherapy.  Her exercise tolerance is improving. She is able to do most everything she would like to. She has been having some pain in her right hand that is new over the past couple of weeks and wonders if that is related to her surgery. Past Medical History  Diagnosis Date  . H/O colon cancer, stage II 09/19/2011    T3N0  Cecum Sept 2010  . Lung nodules 09/19/2011    granulomas  . Depression with anxiety 09/19/2011  . Hyperlipidemia 09/19/2011  . Colon cancer     colon ca dx 02/2009  . Urinary urgency   . Depression   . High cholesterol   . Anxiety       Current Outpatient Prescriptions  Medication Sig Dispense Refill  . aspirin 81 MG tablet Take 81 mg by mouth daily.    [name] b complex vitamins tablet Take 1 tablet by mouth daily.    . Cholecalciferol (VITAMIN D) 2000 UNITS tablet Take 2,000 Units by mouth daily.    [name] FLUoxetine (PROZAC) 20 MG capsule Take 2 capsules (40 mg total) by mouth daily. 180 capsule 1  . HYDROcodone-acetaminophen ([place]) 7.5-325 MG per tablet Take 1 tablet by mouth every 6 (six) hours as needed for moderate pain. 40 tablet 0  . pravastatin (PRAVACHOL) 40 MG tablet TAKE 1 TABLET BY MOUTH EVERY DAY 90 tablet 0   No current facility-administered medications for this visit.    Physical  Exam BP 117/66 mmHg  Pulse 74  Resp 20  Ht '5\' 9"'$  (1.753 m)  Wt 170 lb (77.111 kg)  BMI 25.09 kg/m2  SpO31 77% 69 year old woman in no acute distress Well developed and well-nourished Alert and oriented 3 with no focal deficits Incisions well healed Decreased breath sounds at right base, otherwise clear Cardiac regular rate and rhythm normal S1 and S2 No cervical or supraclavicular adenopathy  Diagnostic Tests: CHEST 2 VIEW  COMPARISON: 11/03/2014, 10/13/2014, 10/07/2014.  FINDINGS: Mediastinum and hilar structures are normal. Right base subsegmental atelectasis and/or pleural parenchymal scarring again noted. Heart size normal. No prominent pleural effusion. No pneumothorax. Bilateral breast implants. No acute bony abnormality.  IMPRESSION: Stable chest with right base subsegmental atelectasis and/or pleural parenchymal scarring.   Electronically Signed  By: Marcello Moores Register  On: 01/12/2015 10:45  I personally reviewed the chest x-ray and agree with the findings as noted above  Impression: 69 year old woman who is now about 3 months out from a thoracoscopic right lower lobe basilar segmentectomy for stage IA non-small cell carcinoma.  She is doing well. She's basically resumed all of her normal activities. She does still have some discomfort. She is not having to use hydrocodone every day but still uses it occasionally.  I gave her prescription for hydrocodone/acetaminophen 5/325 one tablet by mouth up to 4 times daily as needed for pain, dispense 30 tablets, no refills.  She did not have an indication for adjuvant chemotherapy. She does have an appointment to see Dr. Benay Spice back in about 7 months with a repeat CT scan.  Plan: I will plan to see her back after she has the CT.  I spent 10 minutes face-to-face with Mrs. Alaniz during this visit. Greater than 50% was spent in counseling.  [name], MD [facility] [phone]

## 2015-02-05 ENCOUNTER — Other Ambulatory Visit (HOSPITAL_COMMUNITY)
Admission: RE | Admit: 2015-02-05 | Discharge: 2015-02-05 | Disposition: A | Discharge status: Home / Self Care | Payer: Commercial Managed Care - HMO | Source: Ambulatory Visit | Attending: Oncology | Admitting: Oncology

## 2015-02-05 DIAGNOSIS — Z85038 Personal history of other malignant neoplasm of large intestine: Secondary | ICD-10-CM | POA: Diagnosis present

## 2015-02-15 ENCOUNTER — Encounter (HOSPITAL_COMMUNITY): Payer: Self-pay

## 2015-02-26 ENCOUNTER — Encounter: Payer: Self-pay | Admitting: Gastroenterology

## 2015-03-24 ENCOUNTER — Telehealth: Payer: Self-pay | Admitting: *Deleted

## 2015-03-24 NOTE — Telephone Encounter (Signed)
BRAF results on 02/2009 path scanned in under molecular pathology.

## 2015-03-26 ENCOUNTER — Other Ambulatory Visit: Payer: Self-pay | Admitting: Physician Assistant

## 2015-04-12 ENCOUNTER — Ambulatory Visit (INDEPENDENT_AMBULATORY_CARE_PROVIDER_SITE_OTHER): Payer: Commercial Managed Care - HMO | Admitting: Family Medicine

## 2015-04-12 ENCOUNTER — Encounter: Payer: Self-pay | Admitting: Physician Assistant

## 2015-04-12 VITALS — BP 105/69 | HR 79 | Temp 97.9°F | Resp 16 | Ht 68.75 in | Wt 171.0 lb

## 2015-04-12 DIAGNOSIS — L859 Epidermal thickening, unspecified: Secondary | ICD-10-CM

## 2015-04-12 DIAGNOSIS — M79641 Pain in right hand: Secondary | ICD-10-CM

## 2015-04-12 MED ORDER — TRIAMCINOLONE ACETONIDE 40 MG/ML IJ SUSP
40.0000 mg | Freq: Once | INTRAMUSCULAR | Status: AC
  Administered 2015-04-12: 40 mg via INTRAMUSCULAR

## 2015-04-12 NOTE — Progress Notes (Signed)
[facility] [address] [place] [address] [phone]  Date:  04/12/2015   Name:  Kelli [name]   DOB:  1945-09-30   MRN:  [id number]  PCP:  Ellsworth Lennox, MD    Chief Complaint: Follow-up and Hand Pain   History of Present Illness:  This is a 69 y.o. female with PMH stage 2 colon cancer dx'd 2010 in remission, right lobe lung cancer s/p wedge resection 10/2014 in remission, depression with anxiety and HLD who is presenting with complaint of right hand pain. Pain has been present 4 months. I saw pt at onset of pain and radiograph normal. She was to do 1-2 trial of nsaids but no help. Pain described as constant and burning. She states she has to be sure to sleep at night with her hand flat or the pain will wake her up. She was wondering if it's related to her right lung wedge resection. She mentioned this to her surgeon who stated he did not think so. She does not have any arm or neck pain. No hand or arm swelling. No paresthesias. She otherwise feels ok.  She is also complaining of burning and itching of a scar on top of her right shoulder. She has noticed a knot form over the scar. She underwent shave biopsy of a seb keratosis on 7/28. Path not sent d/t classic SK appearance. She has never had keloid formation before and she has had several surgeries in the past.  Review of Systems:  Review of Systems See HPI  Patient Active Problem List   Diagnosis Date Noted  . Diabetes mellitus type 2, uncomplicated ([place]) 16/60/6301  . Lung cancer, lower lobe ([place]) 10/09/2014  . Hematochezia 04/01/2013  . H/O colon cancer, stage II 09/19/2011  . Depression with anxiety 09/19/2011  . Hyperlipidemia 09/19/2011    Prior to Admission medications   Medication Sig Start Date End Date Taking? Authorizing Provider  aspirin 81 MG tablet Take 81 mg by mouth daily.   Yes Historical Provider, MD  b complex vitamins tablet Take 1 tablet by mouth daily.   Yes Historical  Provider, MD  Cholecalciferol (VITAMIN D) 2000 UNITS tablet Take 2,000 Units by mouth daily.   Yes Historical Provider, MD  FLUoxetine (PROZAC) 20 MG capsule Take 2 capsules (40 mg total) by mouth daily. 06/17/14  Yes Barton Fanny, MD  pravastatin (PRAVACHOL) 40 MG tablet TAKE 1 TABLET BY MOUTH EVERY DAY. 03/26/15  Yes Chelle Jeffery, PA-C           Allergies  Allergen Reactions  . Oxycodone Other (See Comments)    Does not work- Hydrocodone does work    Past Surgical History  Procedure Laterality Date  . Colon surgery      2010  . Partial colectomy Right 2010  . Breast surgery  1978    augmentation  . Appendectomy    . Video assisted thoracoscopy (vats)/wedge resection Right 10/05/2014    Procedure: Right VIDEO ASSISTED THORACOSCOPY (VATS) with right lower lobe lung nodule WEDGE RESECTION;  Surgeon: [name], MD;  Location: [place];  Service: Thoracic;  Laterality: Right;  . Segmentecomy Right 10/05/2014    Procedure: right lower lobe SEGMENTECTOMY;  Surgeon: [name], MD;  Location: [place];  Service: Thoracic;  Laterality: Right;  . Cryo intercostal nerve block N/A 10/05/2014    Procedure: CRYO INTERCOSTAL NERVE BLOCK;  Surgeon: [name], MD;  Location: [place];  Service: Thoracic;  Laterality:  N/A;  . Lymph node dissection Right 10/05/2014    Procedure: LYMPH NODE DISSECTION;  Surgeon: [name], MD;  Location: [place];  Service: Thoracic;  Laterality: Right;    Social History  Substance Use Topics  . Smoking status: Former Smoker -- 3.00 packs/day for 40 years    Types: Cigarettes    Quit date: 02/06/2009  . Smokeless tobacco: Never Used  . Alcohol Use: 0.0 oz/week    0 Standard drinks or equivalent per week     Comment: 3-4 times a week 1-2 beers    Family History  Problem Relation Age of Onset  . Heart disease Mother   . Heart disease Brother     Medication list has been reviewed and updated.  Physical  Examination:  Physical Exam  Constitutional: She is oriented to person, place, and time. She appears well-developed and well-nourished. No distress.  HENT:  Head: Normocephalic and atraumatic.  Right Ear: Hearing normal.  Left Ear: Hearing normal.  Nose: Nose normal.  Eyes: Conjunctivae and lids are normal. Right eye exhibits no discharge. Left eye exhibits no discharge. No scleral icterus.  Cardiovascular: Normal rate, regular rhythm and normal pulses.   Pulmonary/Chest: Effort normal and breath sounds normal. No respiratory distress.  Musculoskeletal: Normal range of motion.       Right shoulder: Normal.       Right elbow: Normal.      Right upper arm: Normal.       Right forearm: Normal.       Right hand: She exhibits tenderness (mild tenderness between 3rd and 4th digits, see depiction). She exhibits normal range of motion, no bony tenderness, normal capillary refill, no deformity and no swelling. Normal sensation noted. Normal strength noted.       Left hand: Normal.       Hands: No hand or arm swelling tinels negative  Neurological: She is alert and oriented to person, place, and time.  Skin: Skin is warm, dry and intact.  1 cm pink hyperkeratotic scarred lesion on upper right shoulder. Mildly ttp. No evidence infection.  Psychiatric: She has a normal mood and affect. Her speech is normal and behavior is normal. Thought content normal.   BP 105/69 mmHg  Pulse 79  Temp(Src) 97.9 F (36.6 C) (Oral)  Resp 16  Ht 5' 8.75" (1.746 m)  Wt 171 lb (77.565 kg)  BMI 25.44 kg/m2  SpO2 98%  Procedure: Verbal consent obtained. Skin cleaned with alcohol. 0.2 ml triamcinolone 40 mg/ml injected superficially into hyperkeratotic scarred lesion. Dressing placed.  Assessment and Plan:  1. Skin hyperkeratosis Injected hyperkeratotic scar with kenalog to see if will improve symptoms. If no improvement in 2-4 weeks she will let me know and will refer to dermatology. - triamcinolone  acetonide (KENALOG-40) injection 40 mg; Inject 1 mL (40 mg total) into the muscle once.  2. Hand pain, right Persistent hand pain, unresponsive to nsaids, negative radiograph. Exam benign. Possible neuroma? Will get hand MRI for further evaluation. - MR Hand Right W Wo Contrast; Standing    Benjaman Pott. Drenda Freeze, MHS [facility]  04/13/2015

## 2015-04-12 NOTE — Patient Instructions (Signed)
You will get a phone call about appt for hand MRI. I will call you with the results and we will determine at that time what to do. If in 2-4 weeks, no improvement if scar symptoms, let me know and I will refer to dermatology.

## 2015-04-14 NOTE — Progress Notes (Signed)
Patient discussed and examined with Ms. Tawni Pummel. Agree with assessment and plan of care per her note.

## 2015-04-28 ENCOUNTER — Ambulatory Visit
Admission: RE | Admit: 2015-04-28 | Discharge: 2015-04-28 | Disposition: A | Discharge status: Home / Self Care | Payer: Commercial Managed Care - HMO | Source: Ambulatory Visit | Attending: Physician Assistant | Admitting: Physician Assistant

## 2015-04-28 DIAGNOSIS — M79641 Pain in right hand: Secondary | ICD-10-CM

## 2015-04-28 MED ORDER — GADOBENATE DIMEGLUMINE 529 MG/ML IV SOLN
15.0000 mL | Freq: Once | INTRAVENOUS | Status: AC | PRN
  Administered 2015-04-28: 15 mL via INTRAVENOUS

## 2015-05-02 ENCOUNTER — Telehealth: Payer: Self-pay | Admitting: Physician Assistant

## 2015-05-02 ENCOUNTER — Other Ambulatory Visit: Payer: Self-pay | Admitting: Physician Assistant

## 2015-05-02 NOTE — Telephone Encounter (Signed)
She will follow up with me in 4-6 weeks. If symptoms not improved with wrist splint, will refer to hand surgery.

## 2015-05-02 NOTE — Telephone Encounter (Signed)
Gave PT normal MRI results. We discussed her options --> wrist splint vs. Gabapentin vs. Referral to hand specialist. She would like to try wrist splint first and do night splinting for possible carpal tunnel. She is going to return tomorrow 11/28 to get fitted for RIGHT wrist splint. Please make a no charge OV and charge capture wrist splint that way. Can move this to my schedule and I will close. No need for her to see me when she comes tmrw.

## 2015-05-03 ENCOUNTER — Ambulatory Visit (INDEPENDENT_AMBULATORY_CARE_PROVIDER_SITE_OTHER): Payer: Commercial Managed Care - HMO | Admitting: Physician Assistant

## 2015-05-03 ENCOUNTER — Other Ambulatory Visit: Payer: Self-pay

## 2015-05-03 DIAGNOSIS — M79641 Pain in right hand: Secondary | ICD-10-CM

## 2015-05-03 NOTE — Progress Notes (Addendum)
Came in to be fitted for right wrist splint d/t right palm burning sensation. Normal radiograph. Normal MRI. Will try splint -- if no help in 1-2 months, will send to hand specialist.  05/05/15 --> pt came it to make sure the brace fit correctly. Reassured it fit. She states so far brace is not helping and hand pain is getting worse every day. She will continue to night splint to see if helpful. Referral placed to hand specialist.

## 2015-05-03 NOTE — Addendum Note (Signed)
Addended by: Ezekiel Slocumb on: 05/03/2015 01:43 PM   Modules accepted: Orders

## 2015-05-04 ENCOUNTER — Telehealth: Payer: Self-pay

## 2015-05-04 NOTE — Telephone Encounter (Signed)
Pt requesting call back from Bennett Scrape re the hand brace that was issued her yesterday    Best phone for pt is [phone]

## 2015-05-04 NOTE — Telephone Encounter (Signed)
Spoke with pt, the hand brace does not even come up to where the pain is. She  States it is too close to her palm and not her hand. She thinks it is right. SHe wants Elmyra Ricks to call her, she will be home all evening.

## 2015-05-05 NOTE — Addendum Note (Signed)
Addended by: Ezekiel Slocumb on: 05/05/2015 04:28 PM   Modules accepted: Orders

## 2015-05-05 NOTE — Telephone Encounter (Signed)
Pt states the brace is too loose. She will stop by sometime this afternoon/evening and I will look at the brace and see if she needs a smaller size. No need for OV.

## 2015-06-24 ENCOUNTER — Other Ambulatory Visit: Payer: Self-pay | Admitting: Physician Assistant

## 2015-06-24 ENCOUNTER — Telehealth: Payer: Self-pay

## 2015-06-24 MED ORDER — PRAVASTATIN SODIUM 40 MG PO TABS: 40.0000 mg | ORAL_TABLET | Freq: Every day | ORAL | Status: DC

## 2015-06-24 NOTE — Telephone Encounter (Signed)
Also, patient would like a refill for pravistatin for a years worth. She states that Dr. Elder Cyphers would do this for her and it keeps her from having to call the pharmacy to get approval every time.

## 2015-06-24 NOTE — Telephone Encounter (Signed)
Yes, that referral is still good.  I will resubmit the referral to their office in the morning.

## 2015-06-24 NOTE — Telephone Encounter (Signed)
Threasa Beards can we use the same referral? Can we send in a years worth of medication. I am only authorized to do 6 months.

## 2015-06-24 NOTE — Telephone Encounter (Signed)
Patient is calling in regards to getting an appointment with a hand specialist. She had cancelled her appointment because the pain went away but now the pain is back.  [phone]

## 2015-06-24 NOTE — Telephone Encounter (Signed)
Did the refill for the patient.

## 2015-06-25 NOTE — Telephone Encounter (Signed)
LM Rx sent in and referral placed.

## 2015-07-05 ENCOUNTER — Telehealth: Payer: Self-pay

## 2015-07-07 ENCOUNTER — Other Ambulatory Visit: Payer: Self-pay

## 2015-07-07 DIAGNOSIS — F39 Unspecified mood [affective] disorder: Secondary | ICD-10-CM

## 2015-07-07 DIAGNOSIS — Z7189 Other specified counseling: Secondary | ICD-10-CM

## 2015-07-07 NOTE — Telephone Encounter (Signed)
Kelli [name], you saw pt in July to est care and then she has been in recently for acute issue w/hand. Pt just asked for a years worth of Rx for chol med which Rexene Agent for pt. Do you want to give her another year's RFs on fluoxetine also, or 6 mos to cover her until a year after she last was seen for depression, or have pt RTC sooner?

## 2015-07-08 MED ORDER — FLUOXETINE HCL 20 MG PO CAPS: 40.0000 mg | ORAL_CAPSULE | Freq: Every day | ORAL | Status: DC

## 2015-07-08 NOTE — Telephone Encounter (Signed)
Ok to give 1 year. Refilled.

## 2015-07-20 DIAGNOSIS — M19041 Primary osteoarthritis, right hand: Secondary | ICD-10-CM | POA: Diagnosis not present

## 2015-08-06 ENCOUNTER — Encounter: Payer: Self-pay | Admitting: Physician Assistant

## 2015-08-06 DIAGNOSIS — M19041 Primary osteoarthritis, right hand: Secondary | ICD-10-CM | POA: Insufficient documentation

## 2015-09-08 ENCOUNTER — Ambulatory Visit (HOSPITAL_COMMUNITY)
Admission: RE | Admit: 2015-09-08 | Discharge: 2015-09-08 | Disposition: A | Discharge status: Home / Self Care | Payer: PPO | Source: Ambulatory Visit | Attending: Oncology | Admitting: Oncology

## 2015-09-08 ENCOUNTER — Other Ambulatory Visit: Payer: Self-pay | Admitting: Nurse Practitioner

## 2015-09-08 ENCOUNTER — Telehealth: Payer: Self-pay | Admitting: Oncology

## 2015-09-08 DIAGNOSIS — C3431 Malignant neoplasm of lower lobe, right bronchus or lung: Secondary | ICD-10-CM

## 2015-09-08 DIAGNOSIS — Z85038 Personal history of other malignant neoplasm of large intestine: Secondary | ICD-10-CM

## 2015-09-08 NOTE — Telephone Encounter (Signed)
per pof to sch pt appt-cld & left pt a message to adv pt of r/s appt on 4/12 '@11'$ :30 adv to call [facility] for [facility] CT scan @ [phone] if she has not heard from them by Friday.

## 2015-09-09 ENCOUNTER — Ambulatory Visit: Payer: Commercial Managed Care - HMO | Admitting: Oncology

## 2015-09-14 ENCOUNTER — Ambulatory Visit (HOSPITAL_COMMUNITY)
Admission: RE | Admit: 2015-09-14 | Discharge: 2015-09-14 | Disposition: A | Discharge status: Home / Self Care | Payer: PPO | Source: Ambulatory Visit | Attending: Nurse Practitioner | Admitting: Nurse Practitioner

## 2015-09-14 DIAGNOSIS — Z9882 Breast implant status: Secondary | ICD-10-CM | POA: Insufficient documentation

## 2015-09-14 DIAGNOSIS — C3431 Malignant neoplasm of lower lobe, right bronchus or lung: Secondary | ICD-10-CM | POA: Diagnosis not present

## 2015-09-14 DIAGNOSIS — R938 Abnormal findings on diagnostic imaging of other specified body structures: Secondary | ICD-10-CM | POA: Insufficient documentation

## 2015-09-14 DIAGNOSIS — J432 Centrilobular emphysema: Secondary | ICD-10-CM | POA: Diagnosis not present

## 2015-09-14 DIAGNOSIS — R911 Solitary pulmonary nodule: Secondary | ICD-10-CM | POA: Diagnosis not present

## 2015-09-14 DIAGNOSIS — Z85038 Personal history of other malignant neoplasm of large intestine: Secondary | ICD-10-CM | POA: Diagnosis not present

## 2015-09-14 DIAGNOSIS — K7689 Other specified diseases of liver: Secondary | ICD-10-CM | POA: Diagnosis not present

## 2015-09-15 ENCOUNTER — Ambulatory Visit (HOSPITAL_BASED_OUTPATIENT_CLINIC_OR_DEPARTMENT_OTHER): Payer: PPO | Admitting: Oncology

## 2015-09-15 ENCOUNTER — Telehealth: Payer: Self-pay | Admitting: Oncology

## 2015-09-15 VITALS — BP 118/43 | HR 73 | Temp 98.3°F | Resp 18 | Ht 68.75 in | Wt 163.5 lb

## 2015-09-15 DIAGNOSIS — C3431 Malignant neoplasm of lower lobe, right bronchus or lung: Secondary | ICD-10-CM

## 2015-09-15 DIAGNOSIS — R911 Solitary pulmonary nodule: Secondary | ICD-10-CM

## 2015-09-15 DIAGNOSIS — Z85038 Personal history of other malignant neoplasm of large intestine: Secondary | ICD-10-CM

## 2015-09-15 DIAGNOSIS — G8912 Acute post-thoracotomy pain: Secondary | ICD-10-CM

## 2015-09-15 DIAGNOSIS — Z85118 Personal history of other malignant neoplasm of bronchus and lung: Secondary | ICD-10-CM | POA: Diagnosis not present

## 2015-09-15 NOTE — Progress Notes (Signed)
[facility] OFFICE PROGRESS NOTE   Diagnosis: Colon cancer, non-small cell lung cancer  INTERVAL HISTORY:   Kelli [name] returns as scheduled. She underwent a restaging chest CT earlier today. She feels well. No complaint. She reports intentional weight loss.  Objective:  Vital signs in last 24 hours:  Blood pressure 118/43, pulse 73, temperature 98.3 F (36.8 C), temperature source Oral, resp. rate 18, height 5' 8.75" (1.746 m), weight 163 lb 8 oz (74.163 kg), SpO2 96 %.    HEENT: Neck without mass Lymphatics: No cervical, supraclavicular, axillary, or inguinal nodes Resp: Lungs clear bilaterally Cardio: Regular rate and rhythm GI: No hepatomegaly, no mass, nontender Vascular: No leg edema  Skin: Right thoracotomy site without evidence of recurrent tumor. Sebaceous cyst at the right upper back and inferior to the left breast     Lab Results:   Imaging:  Ct Chest Wo Contrast  09/15/2015  CLINICAL DATA:  Right lower lobe malignant neoplasm. Colon cancer. Stage II. Non-small-cell lung cancer. EXAM: CT CHEST WITHOUT CONTRAST TECHNIQUE: Multidetector CT imaging of the chest was performed following the standard protocol without IV contrast. COMPARISON:  Chest radiograph of of 01/12/2015. PET 09/16/2014. Chest CT 09/03/2014. FINDINGS: Mediastinum/Nodes: No supraclavicular adenopathy. Bilateral calcified breast implants. Aortic and branch vessel atherosclerosis. Tortuous descending thoracic aorta. Normal heart size, without pericardial effusion. Right coronary artery atherosclerosis. No mediastinal or definite hilar adenopathy, given limitations of unenhanced CT. Lungs/Pleura: Right-sided pleural thickening and trace pleural fluid are likely postoperative, new since the prior PET. Surgical changes in the right lower lobe. Mild centrilobular emphysema. Vague, sub solid right upper lobe 6 mm pulmonary nodule on image 28/series 5, similar. Probable calcified granuloma in the  right upper lobe on image 36/series 5. Perifissural right middle lobe 6 mm nodule on image 57/series 5 is similar. Subpleural right lower lobe nodularity including at 4 mm on image 55/series 5 again identified. An adjacent subpleural 4 mm nodule on image 56/series 5 is likely new. Left apical 5 mm ground-glass nodule on image 17/series 5. This is unchanged. Similar 2 mm subpleural left upper lobe pulmonary nodule on image 34/series 5. Subpleural left upper lobe soft tissue and sub solid pulmonary nodules are unchanged including on image 48/series 5. Foci of subpleural left lower lobe nodularity are not significantly changed. Upper abdomen: 6 mm left hepatic lobe cysts. Normal imaged portions of the spleen, stomach, pancreas, gallbladder, adrenal glands, kidneys. Musculoskeletal: Probable sebaceous cyst about the left chest wall/ inferior left breast. Similar, on the order of 2.7 cm. Probable posterior chest wall sebaceous cyst is slightly more conspicuous today, including 11 mm. No focal osseous lesion. Mild superior endplate irregularity at C7, T8, and T9. Similar. IMPRESSION: 1. Interval resection of the right lower lobe lung nodule. No findings to suggest metastatic disease. 2. Primarily similar scattered soft tissue and ground-glass nodules. A 4 mm right lower lobe subpleural nodule is not readily apparent on the prior and warrants special attention. 3. Mild centrilobular emphysema. 4. Trace right pleural fluid and thickening, likely postoperative. Electronically Signed   By: Abigail Miyamoto M.D.   On: 09/15/2015 08:54  CT images were reviewed with Kelli [name]  Medications: I have reviewed the patient's current medications.  Assessment/Plan: 1. 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 History of colonic polyps-status post a surveillance colonoscopy in February 2016 with a tubular adenoma removed 3. History of  heavy tobacco use, quit  in 2010 4. History of lung nodules-last imaging was an abdomen CT in October 2013 prior to repeat imaging 09/03/2014  CT 09/03/2014 revealed enlargement of a spiculated right lower lobe nodule  PET scan 09/16/2014 revealed low-level FDG activity associated with the right lower lobe spiculated nodule  Status post a right lower lobe basilar segmentectomy and B-cell lymph node dissection on 10/06/2014 with the pathology confirming a minimally invasive well-differentiated adenocarcinoma,pT1a,N0, invasive component measured less than 0.3 cm, with negative surgical margins  CT chest 09/15/2015 with no evidence of recurrent lung cancer, stable nodules except for a probable new 4 mm right lower lobe subpleural nodule  5. Post thoracotomy pain    Disposition:  Kelli [name] remains in clinical remission from colon cancer and lung cancer. She is not smoking. The chest nodules are likely benign. She agrees to a follow-up chest CT in 9 months.  She continues colonoscopy follow-up with Dr. Ardis Hughs.  Betsy Coder, MD  09/15/2015  11:54 AM

## 2015-09-15 NOTE — Telephone Encounter (Signed)
perp of to sch pt appt-adv [facility] to [facility] scan-pt stated will see on MY CHART

## 2015-10-05 ENCOUNTER — Ambulatory Visit (INDEPENDENT_AMBULATORY_CARE_PROVIDER_SITE_OTHER): Payer: PPO | Admitting: Thoracic Surgery (Cardiothoracic Vascular Surgery)

## 2015-10-05 VITALS — BP 105/66 | HR 69 | Resp 20 | Ht 68.75 in | Wt 163.0 lb

## 2015-10-05 DIAGNOSIS — R918 Other nonspecific abnormal finding of lung field: Secondary | ICD-10-CM

## 2015-10-05 DIAGNOSIS — C3491 Malignant neoplasm of unspecified part of right bronchus or lung: Secondary | ICD-10-CM | POA: Diagnosis not present

## 2015-10-05 DIAGNOSIS — Z9889 Other specified postprocedural states: Secondary | ICD-10-CM | POA: Diagnosis not present

## 2015-10-05 NOTE — Progress Notes (Signed)
[address]       [place] [address]             [phone]       HPI: Kelli [name] returns for a 1 year follow-up visit.  She is a 70 yo woman who had a thoracoscopic right lower lobe basilar segmentectomy on 10/06/2014 for a stage IA adenocarcinoma.  Her postoperative course was unremarkable.  We did cryo-analgesia intraoperatively. She initially did not have much pain, but about a week after she went home she started having incisional pain. She says that after about 3 months the pain improved dramatically, and by 9 months of is gone completely.  She saw Dr. Benay Spice postoperatively. He also follows her for colon cancer. He did not recommend any adjuvant chemotherapy for stage IA lung cancer.  She feels well. She does not have any incisional pain. Her exercise tolerance is good. She has not had any unusual cough or hemoptysis. She's not had any unusual headaches or visual changes. She's lost 20 pounds with dieting.  Past Medical History  Diagnosis Date  . H/O colon cancer, stage II 09/19/2011    T3N0  Cecum Sept 2010  . Lung nodules 09/19/2011    granulomas  . Depression with anxiety 09/19/2011  . Hyperlipidemia 09/19/2011  . Colon cancer ([place])     colon ca dx 02/2009  . Urinary urgency   . Depression   . High cholesterol   . Anxiety      Current Outpatient Prescriptions  Medication Sig Dispense Refill  . aspirin 81 MG tablet Take 81 mg by mouth daily.    [name] b complex vitamins tablet Take 1 tablet by mouth daily.    . Cholecalciferol (VITAMIN D) 2000 UNITS tablet Take 2,000 Units by mouth daily.    [name] FLUoxetine (PROZAC) 20 MG capsule Take 2 capsules (40 mg total) by mouth daily. 180 capsule 3  . pravastatin (PRAVACHOL) 40 MG tablet Take 1 tablet (40 mg total) by mouth daily. 90 tablet 3   No current facility-administered medications for this visit.    Physical Exam BP 105/66 mmHg  Pulse 69  Resp 20  Ht 5' 8.75" (1.746 m)  Wt 163 lb (73.936 kg)   BMI 24.25 kg/m2  SpO74 42% 70 year old woman in no acute distress Alert and oriented 3 with no focal deficits No cervical or subclavicular adenopathy Cardiac regular rate and rhythm normal S1 and S2 Lungs diminished at right base, otherwise clear Right chest wall incisions well healed No peripheral edema  Diagnostic Tests: CT CHEST WITHOUT CONTRAST  TECHNIQUE: Multidetector CT imaging of the chest was performed following the standard protocol without IV contrast.  COMPARISON: Chest radiograph of of 01/12/2015. PET 09/16/2014. Chest CT 09/03/2014.  FINDINGS: Mediastinum/Nodes: No supraclavicular adenopathy. Bilateral calcified breast implants. Aortic and branch vessel atherosclerosis. Tortuous descending thoracic aorta. Normal heart size, without pericardial effusion. Right coronary artery atherosclerosis.  No mediastinal or definite hilar adenopathy, given limitations of unenhanced CT.  Lungs/Pleura: Right-sided pleural thickening and trace pleural fluid are likely postoperative, new since the prior PET.  Surgical changes in the right lower lobe. Mild centrilobular emphysema. Vague, sub solid right upper lobe 6 mm pulmonary nodule on image 28/series 5, similar.  Probable calcified granuloma in the right upper lobe on image 36/series 5.  Perifissural right middle lobe 6 mm nodule on image 57/series 5 is similar.  Subpleural right lower lobe nodularity including at 4 mm on image 55/series 5 again  identified. An adjacent subpleural 4 mm nodule on image 56/series 5 is likely new.  Left apical 5 mm ground-glass nodule on image 17/series 5. This is unchanged.  Similar 2 mm subpleural left upper lobe pulmonary nodule on image 34/series 5.  Subpleural left upper lobe soft tissue and sub solid pulmonary nodules are unchanged including on image 48/series 5.  Foci of subpleural left lower lobe nodularity are not significantly changed.  Upper abdomen: 6  mm left hepatic lobe cysts. Normal imaged portions of the spleen, stomach, pancreas, gallbladder, adrenal glands, kidneys.  Musculoskeletal: Probable sebaceous cyst about the left chest wall/ inferior left breast. Similar, on the order of 2.7 cm. Probable posterior chest wall sebaceous cyst is slightly more conspicuous today, including 11 mm. No focal osseous lesion. Mild superior endplate irregularity at C7, T8, and T9. Similar.  IMPRESSION: 1. Interval resection of the right lower lobe lung nodule. No findings to suggest metastatic disease. 2. Primarily similar scattered soft tissue and ground-glass nodules. A 4 mm right lower lobe subpleural nodule is not readily apparent on the prior and warrants special attention. 3. Mild centrilobular emphysema. 4. Trace right pleural fluid and thickening, likely postoperative.   Electronically Signed  By: Abigail Miyamoto M.D.  On: 09/15/2015 08:54  I personally reviewed the CT chest and concur with the findings as noted above.  Impression: 70 year old woman who is now a year out from a right basilar segmentectomy for stage IA non-small cell carcinoma. She had a CT in April which shows multiple lung nodules which are stable. There is a possible new 4 mm subpleural nodule in the right lung. There is no evidence of recurrent disease. These lung nodules do need to be followed and she scheduled see Dr. Benay Spice again in 9 months with a CT of the chest.  Postoperative pain- resolved  Colon cancer, stage II- being followed by Dr. Benay Spice  Weight loss- she lost her weight through dieting. No cause for concern.  Plan: She will continue to follow-up with Dr. Benay Spice. To avoid duplication of services, I will not make her return to see me, but I will be happy to see her at any time if I can be of any assistance, particularly as regards her additional lung nodules.  I spent 15 minutes face-to-face with Mrs. Seldon during this visit [name], MD [facility] [phone]

## 2016-03-09 ENCOUNTER — Ambulatory Visit (INDEPENDENT_AMBULATORY_CARE_PROVIDER_SITE_OTHER): Payer: PPO

## 2016-03-09 ENCOUNTER — Other Ambulatory Visit: Payer: Self-pay | Admitting: Family Medicine

## 2016-03-09 ENCOUNTER — Ambulatory Visit (INDEPENDENT_AMBULATORY_CARE_PROVIDER_SITE_OTHER): Payer: PPO | Admitting: Family Medicine

## 2016-03-09 VITALS — BP 96/60 | HR 73 | Temp 98.2°F | Resp 17 | Ht 68.75 in | Wt 172.0 lb

## 2016-03-09 DIAGNOSIS — M19011 Primary osteoarthritis, right shoulder: Secondary | ICD-10-CM | POA: Diagnosis not present

## 2016-03-09 DIAGNOSIS — Z8639 Personal history of other endocrine, nutritional and metabolic disease: Secondary | ICD-10-CM

## 2016-03-09 DIAGNOSIS — M25511 Pain in right shoulder: Secondary | ICD-10-CM | POA: Diagnosis not present

## 2016-03-09 DIAGNOSIS — R7303 Prediabetes: Secondary | ICD-10-CM | POA: Diagnosis not present

## 2016-03-09 DIAGNOSIS — M7551 Bursitis of right shoulder: Secondary | ICD-10-CM

## 2016-03-09 LAB — POCT GLYCOSYLATED HEMOGLOBIN (HGB A1C): Hemoglobin A1C: 6.4

## 2016-03-09 LAB — GLUCOSE, POCT (MANUAL RESULT ENTRY): POC Glucose: 105 mg/dl — AB (ref 70–99)

## 2016-03-09 MED ORDER — TRIAMCINOLONE ACETONIDE 40 MG/ML IJ SUSP: 40.0000 mg | Freq: Once | INTRAMUSCULAR | Status: DC

## 2016-03-09 MED ORDER — DICLOFENAC SODIUM 75 MG PO TBEC: 75.0000 mg | DELAYED_RELEASE_TABLET | Freq: Two times a day (BID) | ORAL | 0 refills | Status: DC

## 2016-03-09 MED ORDER — HYDROCODONE-ACETAMINOPHEN 5-325 MG PO TABS: 1.0000 | ORAL_TABLET | ORAL | 0 refills | Status: DC | PRN

## 2016-03-09 NOTE — Progress Notes (Addendum)
Patient ID: Kelli [name], female    DOB: 1945-09-13  Age: 70 y.o. MRN: [id number]  Chief Complaint  Patient presents with  . Arm Injury    Rt. 3 weeks ago lifting a heavy box of books, getting progressively worse    Subjective:   2 weeks ago she was helping clean out her deceased brother's home, lifting a lot of heavy books. She strained her right shoulder. It has gradually gotten worse rather than better. She can lay on it. It hurts to move it most directions. No history of prior shoulder problems.  Of note in the chart that she was hypoglycemic a couple of years ago, but she says she is not diabetic she does have a history of cancer but is cancer free now.  She wants a regular doctor. Current allergies, medications, problem list, past/family and social histories reviewed.  Objective:  BP 96/60 (BP Location: Left Arm, Patient Position: Sitting, Cuff Size: Normal)   Pulse 73   Temp 98.2 F (36.8 C) (Oral)   Resp 17   Ht 5' 8.75" (1.746 m)   Wt 172 lb (78 kg)   SpO2 97%   BMI 25.59 kg/m   No major acute distress. Very tender in the top of right shoulder joint, area of the bursa. Lots of pain on movement both passive or active. Hand is normal with good grip. She apparently does have arthritis in her hands.  Assessment & Plan:   Assessment: 1. Acute pain of right shoulder   2. History of hyperglycemia   3. Bursitis of right shoulder   4. Prediabetes       Plan: X-ray shoulder. Will check her sugars in case I decided to give a cortisone shot.  Results for orders placed or performed in visit on 03/09/16  POCT glucose (manual entry)  Result Value Ref Range   POC Glucose 105 (A) 70 - 99 mg/dl  POCT glycosylated hemoglobin (Hb A1C)  Result Value Ref Range   Hemoglobin A1C 6.4    X-ray negative x-ray negative  Procedure note  Injection of Kenalog 40 milligrams per mL 1 mL with 1 mL of 2% lidocaine was injected without difficulty using sterile technique. The medication  number was [place].  Discuss with patient that she is prediabetic  Orders Placed This Encounter  Procedures  . DG Shoulder Right    Standing Status:   Future    Number of Occurrences:   1    Standing Expiration Date:   03/09/2017    Order Specific Question:   Reason for Exam (SYMPTOM  OR DIAGNOSIS REQUIRED)    Answer:   right shoulder pain for two weeks, probably bursitis    Order Specific Question:   Preferred imaging location?    Answer:   External  . POCT glucose (manual entry)  . POCT glycosylated hemoglobin (Hb A1C)    Meds ordered this encounter  Medications  . triamcinolone acetonide (KENALOG-40) injection 40 mg  . diclofenac (VOLTAREN) 75 MG EC tablet    Sig: Take 1 tablet (75 mg total) by mouth 2 (two) times daily.    Dispense:  20 tablet    Refill:  0  . HYDROcodone-acetaminophen (NORCO) 5-325 MG tablet    Sig: Take 1 tablet by mouth every 4 (four) hours as needed.    Dispense:  10 tablet    Refill:  0         Patient Instructions   On your way out talk to the front  desk about scheduling you a physical with a physician so you can get established with someone regularly.  You are prediabetic. I recommend you read the American diabetic Association website. I believe it is diabetes.org but you can go.  You have received a shot of cortisone in your shoulder. Use ice on it several times daily for the next couple of days.   Take diclofenac 75 mg one twice daily for pain and inflammation  You have a prescription for a stronger pain medication. Hydrocodone 5 mg every 4-6 hours only if needed for severe pain. Take with something to avoid it nausea dating you.    IF you received an x-ray today, you will receive an invoice from [facility]. Please contact [facility] at [phone] with questions or concerns regarding your invoice.   IF you received labwork today, you will receive an invoice from [employer]. Please  contact [facility] at [phone] with questions or concerns regarding your invoice.   Our billing staff will not be able to assist you with questions regarding bills from these companies.  You will be contacted with the lab results as soon as they are available. The fastest way to get your results is to activate your My Chart account. Instructions are located on the last page of this paperwork. If you have not heard from [place] regarding the results in 2 weeks, please contact this office.        Return if symptoms worsen or fail to improve.   Demetre Monaco, MD 03/09/2016

## 2016-03-09 NOTE — Addendum Note (Signed)
Addended by: Etta Gassett H on: 03/09/2016 05:04 PM   Modules accepted: Orders

## 2016-03-09 NOTE — Patient Instructions (Addendum)
On your way out talk to the front desk about scheduling you a physical with a physician so you can get established with someone regularly.  You are prediabetic. I recommend you read the American diabetic Association website. I believe it is diabetes.org but you can go.  You have received a shot of cortisone in your shoulder. Use ice on it several times daily for the next couple of days.   Take diclofenac 75 mg one twice daily for pain and inflammation  You have a prescription for a stronger pain medication. Hydrocodone 5 mg every 4-6 hours only if needed for severe pain. Take with something to avoid it nausea dating you.    IF you received an x-ray today, you will receive an invoice from [facility]. Please contact [facility] at [phone] with questions or concerns regarding your invoice.   IF you received labwork today, you will receive an invoice from [employer]. Please contact [facility] at [phone] with questions or concerns regarding your invoice.   Our billing staff will not be able to assist you with questions regarding bills from these companies.  You will be contacted with the lab results as soon as they are available. The fastest way to get your results is to activate your My Chart account. Instructions are located on the last page of this paperwork. If you have not heard from [place] regarding the results in 2 weeks, please contact this office.

## 2016-06-04 ENCOUNTER — Telehealth: Payer: Self-pay | Admitting: Family Medicine

## 2016-06-04 NOTE — Telephone Encounter (Signed)
Spoke with patient --- a few weeks ago, friend visiting; got better.  Last Thursday, fever returned and is persisting.  No vomiting; no chills now.  +rhinorrhea.  +fever Tmax 101.5.  +coughing; taking lozenges; tickle.  +body aches.  No headache.  No flu vaccines. History of colon cancer and lung cancer several years ago; released from oncology care.  Taking ASA; brother in law recommended tylenol alternating with asa.  No Tylenol at home.  Has been taking Advil with ASA.  Minimal cough.  Also having recurrent shoulder pain; appears different location in R shoulder.   Scheduling, please schedule pt OV with me in upcoming week to address shoulder pain.

## 2016-06-15 ENCOUNTER — Ambulatory Visit (HOSPITAL_COMMUNITY): Payer: PPO

## 2016-06-15 ENCOUNTER — Other Ambulatory Visit: Payer: Self-pay | Admitting: *Deleted

## 2016-06-15 DIAGNOSIS — C3431 Malignant neoplasm of lower lobe, right bronchus or lung: Secondary | ICD-10-CM

## 2016-06-19 ENCOUNTER — Ambulatory Visit (HOSPITAL_COMMUNITY)
Admission: RE | Admit: 2016-06-19 | Discharge: 2016-06-19 | Disposition: A | Discharge status: Home / Self Care | Payer: PPO | Source: Ambulatory Visit | Attending: Oncology | Admitting: Oncology

## 2016-06-19 DIAGNOSIS — C3431 Malignant neoplasm of lower lobe, right bronchus or lung: Secondary | ICD-10-CM | POA: Insufficient documentation

## 2016-06-19 DIAGNOSIS — Z9889 Other specified postprocedural states: Secondary | ICD-10-CM | POA: Insufficient documentation

## 2016-06-19 DIAGNOSIS — C3491 Malignant neoplasm of unspecified part of right bronchus or lung: Secondary | ICD-10-CM | POA: Diagnosis not present

## 2016-06-20 ENCOUNTER — Telehealth: Payer: Self-pay | Admitting: Oncology

## 2016-06-20 ENCOUNTER — Ambulatory Visit (HOSPITAL_BASED_OUTPATIENT_CLINIC_OR_DEPARTMENT_OTHER): Payer: PPO | Admitting: Oncology

## 2016-06-20 VITALS — BP 128/65 | HR 73 | Temp 98.6°F | Resp 18 | Ht 68.75 in | Wt 172.8 lb

## 2016-06-20 DIAGNOSIS — Z85118 Personal history of other malignant neoplasm of bronchus and lung: Secondary | ICD-10-CM

## 2016-06-20 DIAGNOSIS — C3431 Malignant neoplasm of lower lobe, right bronchus or lung: Secondary | ICD-10-CM

## 2016-06-20 NOTE — Progress Notes (Signed)
[facility] OFFICE PROGRESS NOTE   Diagnosis: Non-small cell lung cancer, colon cancer  INTERVAL HISTORY:   Kelli [name] returns as scheduled. She feels well. Good appetite. No complaint. She plans to obtain a new primary physician in [place].  Objective:  Vital signs in last 24 hours:  Blood pressure 128/65, pulse 73, temperature 98.6 F (37 C), temperature source Oral, resp. rate 18, height 5' 8.75" (1.746 m), weight 172 lb 12.8 oz (78.4 kg), SpO2 98 %.    HEENT: Neck without mass Lymphatics: No cervical, supra-clavicular, axillary, or inguinal nodes Resp: Lungs clear bilaterally with decreased breath sounds at the right lower chest, no respiratory distress Cardio: Distant heart sounds, regular rate and rhythm GI: No hepatosplenomegaly, no mass, nontender Vascular: No leg edema   Imaging:  Ct Chest Wo Contrast  Result Date: 06/19/2016 CLINICAL DATA:  Restaging RIGHT lung cancer. Additional history of colon cancer EXAM: CT CHEST WITHOUT CONTRAST TECHNIQUE: Multidetector CT imaging of the chest was performed following the standard protocol without IV contrast. COMPARISON:  09/14/2015 FINDINGS: Cardiovascular: No significant vascular findings. Normal heart size. No pericardial effusion. Mediastinum/Nodes: No axillary or supraclavicular adenopathy. Bilateral subglandular breast implants noted. No mediastinal hilar adenopathy. No pericardial fluid esophagus normal. Lungs/Pleura: 7 mm RIGHT middle lobe pulmonary nodule. 4 mm RIGHT lower lobe pulmonary nodule (image 81, series 5). These 2 nodules unchanged in size. Postsurgical change at the RIGHT lung base. Several pleural-based nodules in the LEFT hemithorax are unchanged. For example 4 mm LEFT lower lobe nodule on image 17, series 5. No new pulmonary nodules. Upper Abdomen: Limited view of the liver, kidneys, pancreas are unremarkable. Normal adrenal glands. Musculoskeletal: Fluid density Nodule inferior medial to the  LEFT breast measures 26 mm and not changed. No aggressive osseous lesion. IMPRESSION: 1. Stable bilateral small pulmonary nodules. 2. No mediastinal adenopathy. 3. Postsurgical change in the RIGHT lower lobe. 4. No evidence disease progression. Electronically Signed   By: Suzy Bouchard M.D.   On: 06/19/2016 17:42    Medications: I have reviewed the patient's current medications.  Assessment/Plan: 1. 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 History of colonic polyps-status post a surveillance colonoscopy in February 2016 with a tubular adenoma removed 3. History of heavy tobacco use, quit in 2010 4. History of lung nodules-last imaging was an abdomen CT in October 2013 prior to repeat imaging 09/03/2014  CT 09/03/2014 revealed enlargement of a spiculated right lower lobe nodule  PET scan 09/16/2014 revealed low-level FDG activity associated with the right lower lobe spiculated nodule  Status post a right lower lobe basilar segmentectomy and mediastinal lymph node dissection on 10/06/2014 with the pathology confirming a minimally invasive well-differentiated adenocarcinoma,pT1a,N0, invasive component measured less than 0.3 cm, with negative surgical margins  CT chest 09/15/2015 with no evidence of recurrent lung cancer, stable nodules except for a probable new 4 mm right lower lobe subpleural nodule  CT chest 06/19/2016-no evidence of recurrent lung cancer, stable lung nodules  5. Post thoracotomy pain-resolved    Disposition:  Kelli [name] remains in clinical remission from colon cancer and lung cancer. She will see Dr. Roxan Hockey in April. She will return for an office visit and repeat chest CT in 9 months. She continues surveillance colonoscopies with Dr. Ardis Hughs.  15 minutes were spent with the patient today. The majority of the time was used for counseling and coordination of  care.  Betsy Coder, MD  06/20/2016  12:28 PM

## 2016-06-20 NOTE — Telephone Encounter (Signed)
Appointments scheduled per 1/16 LOS. Patient given AVS report and calendars with future scheduled appointments.

## 2016-07-07 IMAGING — CR DG CHEST 1V PORT
1 series · 1 of 1 positions shown · non-contrast
Comparison: Chest x-ray 10/05/2014.

CLINICAL DATA: 68-year-old female status post VATS for right lower
lobe wedge resection.

EXAM:
PORTABLE CHEST - 1 VIEW

[AP]
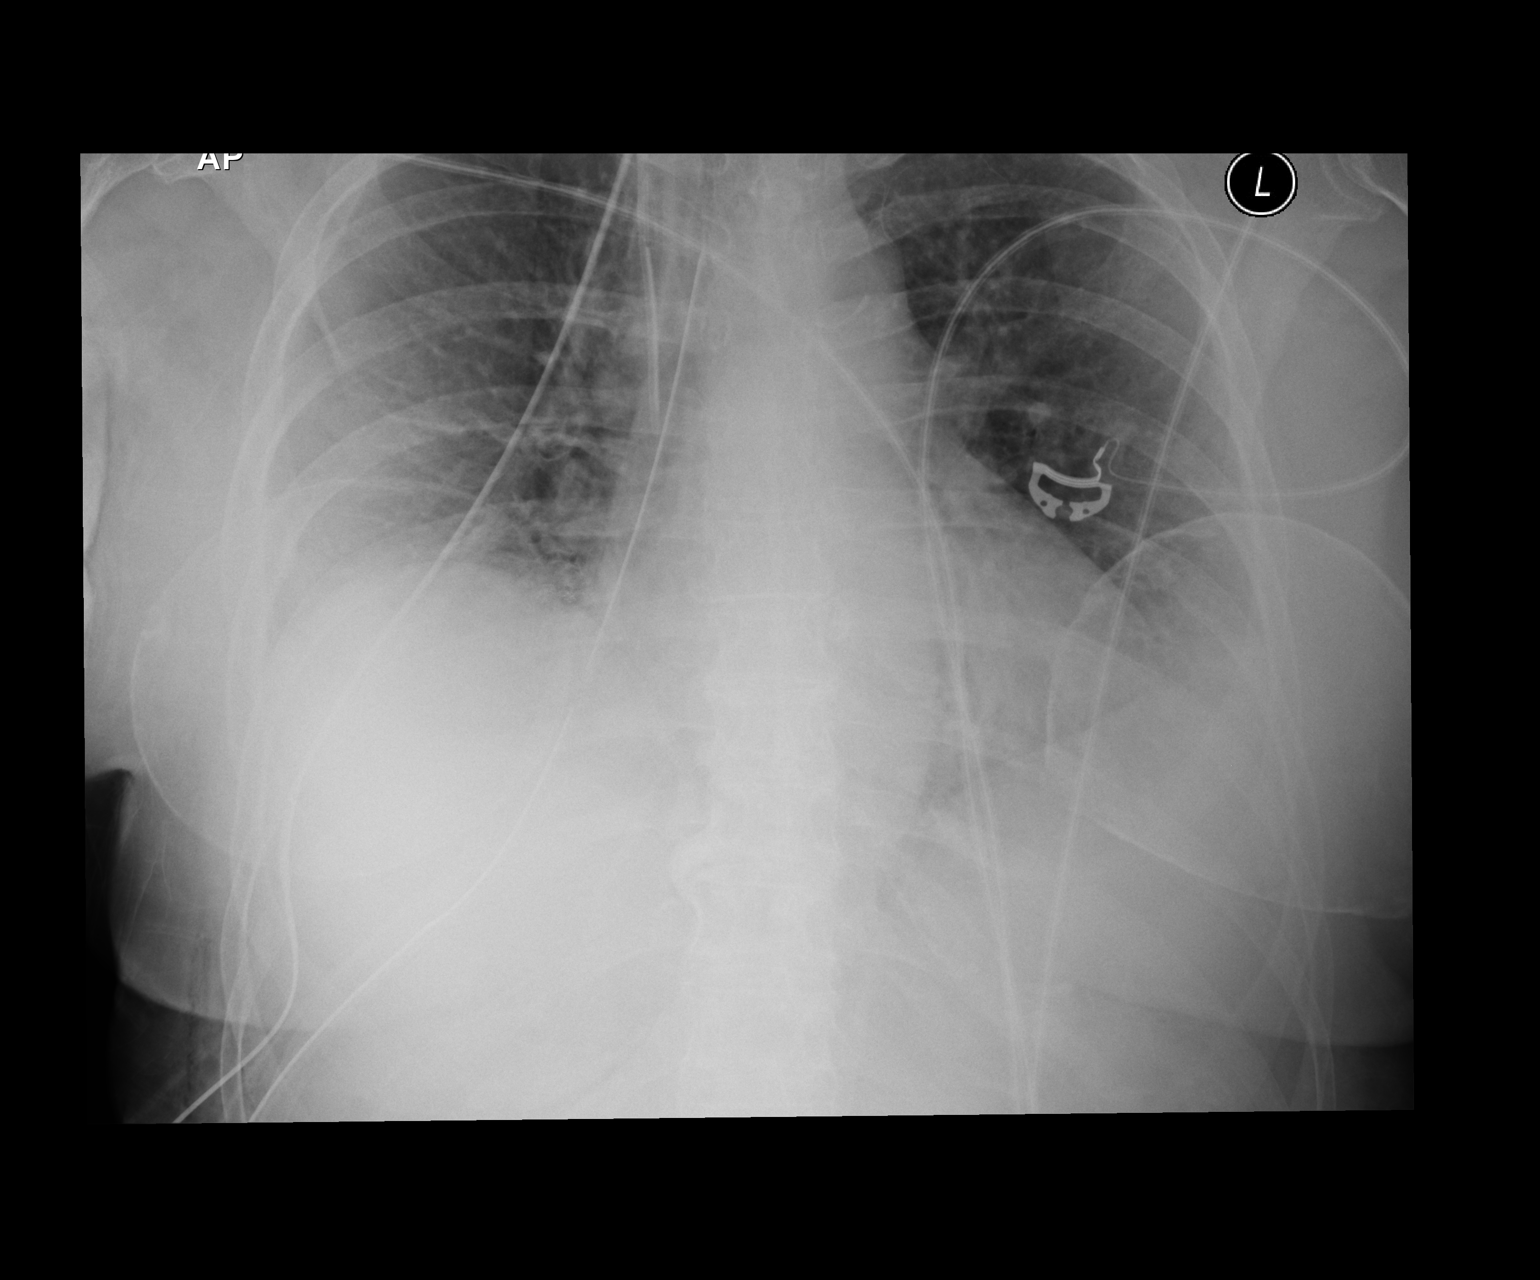

[1 of 1 positions shown; findings below may reference images not displayed]

FINDINGS: There is a right-sided internal jugular central venous catheter with
tip terminating in the distal superior vena cava. 2 right-sided
chest tubes in position with tips in the medial aspect of the upper
right hemithorax. No appreciable right-sided pneumothorax is
identified at this time. Lung volumes are low. There are bibasilar
opacities (right greater than left), favored to predominantly
reflect subsegmental atelectasis and mild postoperative changes in
the right lower lobe. No acute consolidative airspace disease. No
pleural effusions. No evidence of pulmonary edema. Heart size is
upper limits of normal. Upper mediastinal contours are within normal
limits. Atherosclerosis in the thoracic aorta. Bilateral breast
implants with capsular calcification incidentally noted.
IMPRESSION: 1. Support apparatus, as above.
2. No pneumothorax.
3. Low lung volumes with probable bibasilar subsegmental atelectasis
and resolving postoperative changes in the right lower lobe.
4. Atherosclerosis.

## 2016-07-11 ENCOUNTER — Encounter: Payer: Self-pay | Admitting: Osteopathic Medicine

## 2016-07-11 ENCOUNTER — Other Ambulatory Visit: Payer: Self-pay | Admitting: Osteopathic Medicine

## 2016-07-11 ENCOUNTER — Ambulatory Visit (INDEPENDENT_AMBULATORY_CARE_PROVIDER_SITE_OTHER): Payer: PPO | Admitting: Osteopathic Medicine

## 2016-07-11 VITALS — BP 130/60 | HR 86 | Ht 69.0 in | Wt 175.0 lb

## 2016-07-11 DIAGNOSIS — R002 Palpitations: Secondary | ICD-10-CM

## 2016-07-11 DIAGNOSIS — G8929 Other chronic pain: Secondary | ICD-10-CM

## 2016-07-11 DIAGNOSIS — E1169 Type 2 diabetes mellitus with other specified complication: Secondary | ICD-10-CM

## 2016-07-11 DIAGNOSIS — E119 Type 2 diabetes mellitus without complications: Secondary | ICD-10-CM | POA: Diagnosis not present

## 2016-07-11 DIAGNOSIS — M25511 Pain in right shoulder: Secondary | ICD-10-CM

## 2016-07-11 DIAGNOSIS — Z1159 Encounter for screening for other viral diseases: Secondary | ICD-10-CM

## 2016-07-11 DIAGNOSIS — Z7189 Other specified counseling: Secondary | ICD-10-CM

## 2016-07-11 DIAGNOSIS — F418 Other specified anxiety disorders: Secondary | ICD-10-CM | POA: Diagnosis not present

## 2016-07-11 DIAGNOSIS — F39 Unspecified mood [affective] disorder: Secondary | ICD-10-CM

## 2016-07-11 DIAGNOSIS — E785 Hyperlipidemia, unspecified: Secondary | ICD-10-CM

## 2016-07-11 LAB — POCT UA - MICROALBUMIN
Creatinine, POC: 300 mg/dL
Microalbumin Ur, POC: 80 mg/L

## 2016-07-11 LAB — POCT GLYCOSYLATED HEMOGLOBIN (HGB A1C): HEMOGLOBIN A1C: 7.1

## 2016-07-11 MED ORDER — MELOXICAM 7.5 MG PO TABS: 7.5000 mg | ORAL_TABLET | Freq: Every day | ORAL | 0 refills | Status: DC

## 2016-07-11 MED ORDER — PRAVASTATIN SODIUM 40 MG PO TABS: 40.0000 mg | ORAL_TABLET | Freq: Every day | ORAL | 3 refills | Status: DC

## 2016-07-11 MED ORDER — FLUOXETINE HCL 20 MG PO CAPS: 40.0000 mg | ORAL_CAPSULE | Freq: Every day | ORAL | 3 refills | Status: DC

## 2016-07-11 NOTE — Progress Notes (Signed)
HPI: Kelli [name] is a 71 y.o. female  who presents to [facility] today, 07/11/16,  for chief complaint of:  Chief Complaint  Patient presents with  . Establish Care     DM2: DIABETES SCREENING/PREVENTIVE CARE: A1C past 3-6 mos: Yes  controlled? Yes  BP goal <140/90: Yes  LDL goal <70: no - but last labs 2016 Eye exam annually: No, importance discussed with patient Foot exam: Yes  Microalbuminuria:No  Metformin: No  ACE/ARB: No  Antiplatelet if ASCVD Risk >10%: Yes  Statin: Yes  Pneumovax: No   Anxiety: Fluoxetine, doing well on this medication, does not think she is due for refills.   HLD: Pravastatin, somewhat compliant with low fat diet  Hx colon cancer: following with HemOnc  Hx lung cancer: following with HemOnc  MSK: Hx injection for muscle pull on R shoulder, worked for a month, then thinks maybe pulled something in that same shoulder, feel slike a different place on bicep area rather than in joint.    Palpitations: Sometimes every week, sometimes ever few days, goes away on its own. Feels like skipping beat. Nonpainful. No SOB/Dizziness, no syncope. Declined EKG today but states she will come back for this.    Past medical, surgical, social and family history reviewed: Patient Active Problem List   Diagnosis Date Noted  . Osteoarthritis of hand, right 08/06/2015  . Diabetes mellitus type 2, uncomplicated ([facility] [place]) 16/03/9603  . Lung cancer, lower lobe ([facility]) 10/09/2014  . Hematochezia 04/01/2013  . H/O colon cancer, stage II 09/19/2011  . Depression with anxiety 09/19/2011  . Hyperlipidemia 09/19/2011   Past Surgical History:  Procedure Laterality Date  . APPENDECTOMY    . BREAST SURGERY  1978   augmentation  . COLON SURGERY     2010  . CRYO INTERCOSTAL NERVE BLOCK N/A 10/05/2014   Procedure: CRYO INTERCOSTAL NERVE BLOCK;  Surgeon: [name], MD;  Location: [facility];  Service: Thoracic;  Laterality: N/A;  .  LYMPH NODE DISSECTION Right 10/05/2014   Procedure: LYMPH NODE DISSECTION;  Surgeon: [name], MD;  Location: [facility];  Service: Thoracic;  Laterality: Right;  . PARTIAL COLECTOMY Right 2010  . SEGMENTECOMY Right 10/05/2014   Procedure: right lower lobe SEGMENTECTOMY;  Surgeon: [name], MD;  Location: [facility];  Service: Thoracic;  Laterality: Right;  [name] VIDEO ASSISTED THORACOSCOPY (VATS)/WEDGE RESECTION Right 10/05/2014   Procedure: Right VIDEO ASSISTED THORACOSCOPY (VATS) with right lower lobe lung nodule WEDGE RESECTION;  Surgeon: [name], MD;  Location: [facility];  Service: Thoracic;  Laterality: Right;   Social History  Substance Use Topics  . Smoking status: Former Smoker    Packs/day: 3.00    Years: 40.00    Types: Cigarettes    Quit date: 02/06/2009  . Smokeless tobacco: Never Used  . Alcohol use 0.0 oz/week     Comment: 3-4 times a week 1-2 beers   Family History  Problem Relation Age of Onset  . Heart disease Mother   . Heart disease Brother      Current medication list and allergy/intolerance information reviewed:   Current Outpatient Prescriptions  Medication Sig Dispense Refill  . aspirin 81 MG tablet Take 81 mg by mouth daily.    [name] b complex vitamins tablet Take 1 tablet by mouth daily.    . Cholecalciferol (VITAMIN D) 2000 UNITS tablet Take 2,000 Units by mouth daily.    [name] FLUoxetine (PROZAC) 20 MG capsule Take 2  capsules (40 mg total) by mouth daily. 180 capsule 3  . pravastatin (PRAVACHOL) 40 MG tablet Take 40 mg by mouth daily.  3   No current facility-administered medications for this visit.    Allergies  Allergen Reactions  . Oxycodone Other (See Comments)    Does not work- Hydrocodone does work      Review of Systems:  Constitutional:  No  fever, no chills, No recent illness, No unintentional weight changes. No significant fatigue.   HEENT: No  headache, no vision change, no hearing change, No sore throat, No  sinus  pressure  Cardiac: No  chest pain, No  pressure, +palpitations, No  Orthopnea  Respiratory:  No  shortness of breath. No  Cough  Gastrointestinal: No  abdominal pain, No  nausea, No  vomiting,  No  blood in stool, No  diarrhea, No  constipation   Musculoskeletal: +No new myalgia/arthralgia  Genitourinary: No  incontinence, No  abnormal genital bleeding, No abnormal genital discharge  Skin: No  Rash, No other wounds/concerning lesions  Hem/Onc: No  easy bruising/bleeding, No  abnormal lymph node  Endocrine: No cold intolerance,  No heat intolerance. No polyuria/polydipsia/polyphagia   Neurologic: No  weakness, No  dizziness  Psychiatric: No  concerns with depression, No  concerns with anxiety, No sleep problems, No mood problems  Exam:  BP 130/60   Pulse 86   Ht '5\' 9"'$  (1.753 m)   Wt 175 lb (79.4 kg)   BMI 25.84 kg/m   Constitutional: VS see above. General Appearance: alert, well-developed, well-nourished, NAD  Eyes: Normal lids and conjunctive, non-icteric sclera  Ears, Nose, Mouth, Throat: MMM,   Neck: No masses, trachea midline.   Respiratory: Normal respiratory effort. no wheeze, no rhonchi, no rales  Cardiovascular: S1/S2 normal, no murmur, no rub/gallop auscultated. RRR. No lower extremity edema.   Musculoskeletal: Gait normal. No clubbing/cyanosis of digits. Normal passive ROM shoulder to ext/int rotation, active abduction is painful and limits exam  Neurological: Normal balance/coordination. No tremor.   Psychiatric: Normal judgment/insight. Normal mood and affect. Oriented x3.    Results for orders placed or performed in visit on 07/11/16 (from the past 72 hour(s))  POCT HgB A1C     Status: None   Collection Time: 07/11/16  2:24 PM  Result Value Ref Range   Hemoglobin A1C 7.1   POCT UA - Microalbumin     Status: Normal   Collection Time: 07/11/16  2:25 PM  Result Value Ref Range   Microalbumin Ur, POC 80 mg/L   Creatinine, POC 300 mg/dL    Albumin/Creatinine Ratio, Urine, POC <30     Reviewed XR shoulder 03/09/16 no fx, mild AC arthritis, subacromial spur.   ASSESSMENT/PLAN:    Diabetes mellitus without complication (HCC) - declines Rx - Plan: POCT HgB A1C, POCT UA - Microalbumin, CBC with Differential/Platelet, COMPLETE METABOLIC PANEL WITH GFR, Lipid panel, VITAMIN D 25 Hydroxy (Vit-D Deficiency, Fractures)  Hyperlipidemia associated with type 2 diabetes mellitus ([facility]) - Plan: Lipid panel, TSH  Depression with anxiety - Fluoxetine  Need for hepatitis C screening test - Plan: Hepatitis C antibody  Chronic right shoulder pain - Plan: meloxicam (MOBIC) 7.5 MG tablet, Ambulatory referral to Physical Therapy  Heart palpitations - clines EKG, symptoms not overly bothering her and she will follow up on this at later date, precautions reviewed, not getting EKG today is AMA and pt verbalizes understanding - Plan: TSH  Mood disorder ([place]) - Plan: FLUoxetine (PROZAC) 20 MG capsule  Encounter  for medication review and counseling - Plan: FLUoxetine (PROZAC) 20 MG capsule    Patient Instructions  Plan:  You have diabetes, but not severe. Plan to recheck A1C levels in 3 months. Work on [employer] and exercise as tolerated until then. If A1C is still ok, we won't need to start medications   For shoulder: try antiinflammatories and physical therapy, follow-up with Dr. [name] or Dr. Darene Lamer.   For EKG< come back in 1-2 weeks! If chest pain, dizziness or other problems, seek care sooner!       Visit summary with medication list and pertinent instructions was printed for patient to review. All questions at time of visit were answered - patient instructed to contact office with any additional concerns. ER/RTC precautions were reviewed with the patient. Follow-up plan: Return in about 3 months (around 10/08/2016) for [place], sooner if needed. [name]

## 2016-07-11 NOTE — Patient Instructions (Addendum)
Plan:  You have diabetes, but not severe. Plan to recheck A1C levels in 3 months. Work on [employer] and exercise as tolerated until then. If A1C is still ok, we won't need to start medications   For shoulder: try antiinflammatories and physical therapy, follow-up with Dr. [name] or Dr. Darene Lamer.   For EKG< come back in 1-2 weeks! If chest pain, dizziness or other problems, seek care sooner!

## 2016-07-13 ENCOUNTER — Telehealth: Payer: Self-pay | Admitting: Osteopathic Medicine

## 2016-07-13 DIAGNOSIS — Z23 Encounter for immunization: Secondary | ICD-10-CM | POA: Insufficient documentation

## 2016-07-13 NOTE — Telephone Encounter (Signed)
error 

## 2016-07-25 ENCOUNTER — Encounter: Payer: Self-pay | Admitting: Family Medicine

## 2016-07-25 ENCOUNTER — Ambulatory Visit (INDEPENDENT_AMBULATORY_CARE_PROVIDER_SITE_OTHER): Payer: PPO | Admitting: Osteopathic Medicine

## 2016-07-25 ENCOUNTER — Encounter: Payer: Self-pay | Admitting: Osteopathic Medicine

## 2016-07-25 ENCOUNTER — Ambulatory Visit (INDEPENDENT_AMBULATORY_CARE_PROVIDER_SITE_OTHER): Payer: PPO | Admitting: Family Medicine

## 2016-07-25 VITALS — BP 121/87 | HR 71

## 2016-07-25 VITALS — BP 121/87 | HR 71 | Ht 69.0 in | Wt 173.0 lb

## 2016-07-25 DIAGNOSIS — Z1159 Encounter for screening for other viral diseases: Secondary | ICD-10-CM | POA: Diagnosis not present

## 2016-07-25 DIAGNOSIS — E119 Type 2 diabetes mellitus without complications: Secondary | ICD-10-CM | POA: Diagnosis not present

## 2016-07-25 DIAGNOSIS — R002 Palpitations: Secondary | ICD-10-CM | POA: Diagnosis not present

## 2016-07-25 DIAGNOSIS — E785 Hyperlipidemia, unspecified: Secondary | ICD-10-CM | POA: Diagnosis not present

## 2016-07-25 DIAGNOSIS — Z1321 Encounter for screening for nutritional disorder: Secondary | ICD-10-CM | POA: Diagnosis not present

## 2016-07-25 DIAGNOSIS — M25511 Pain in right shoulder: Secondary | ICD-10-CM | POA: Diagnosis not present

## 2016-07-25 DIAGNOSIS — E1169 Type 2 diabetes mellitus with other specified complication: Secondary | ICD-10-CM | POA: Diagnosis not present

## 2016-07-25 NOTE — Patient Instructions (Addendum)
Thank you for coming in today. Return in about 1 month.  Attend PT.  Call or go to the ER if you develop a large red swollen joint with extreme pain or oozing puss.    Shoulder Impingement Syndrome Shoulder impingement syndrome is a condition that causes pain when connective tissues (tendons) surrounding the shoulder joint become pinched. These tendons are part of the group of muscles and tissues that help to stabilize the shoulder (rotator cuff). Beneath the rotator cuff is a fluid-filled sac (bursa) that allows the muscles and tendons to glide smoothly. The bursa may become swollen or irritated (bursitis). Bursitis, swelling in the rotator cuff tendons, or both conditions can decrease how much space is under a bone in the shoulder joint (acromion), resulting in impingement. What are the causes? Shoulder impingement syndrome can be caused by bursitis or swelling of the rotator cuff tendons, which may result from:  Repetitive overhead arm movements.  Falling onto the shoulder.  Weakness in the shoulder muscles. What increases the risk? You may be more likely to develop this condition if you are an athlete who participates in:  Sports that involve throwing, such as baseball.  Tennis.  Swimming.  Volleyball. Some people are also more likely to develop impingement syndrome because of the shape of their acromion bone. What are the signs or symptoms? The main symptom of this condition is pain on the front or side of the shoulder. Pain may:  Get worse when lifting or raising the arm.  Get worse at night.  Wake you up from sleeping.  Feel sharp when the shoulder is moved, and then fade to an ache. Other signs and symptoms may include:  Tenderness.  Stiffness.  Inability to raise the arm above shoulder level or behind the body.  Weakness. How is this diagnosed? This condition may be diagnosed based on:  Your symptoms.  Your medical history.  A physical exam.  Imaging  tests, such as:  X-rays.  MRI.  Ultrasound. How is this treated? Treatment for this condition may include:  Resting your shoulder and avoiding all activities that cause pain or put stress on the shoulder.  Icing your shoulder.  NSAIDs to help reduce pain and swelling.  One or more injections of medicines to numb the area and reduce inflammation.  Physical therapy.  Surgery. This may be needed if nonsurgical treatments have not helped. Surgery may involve repairing the rotator cuff, reshaping the acromion, or removing the bursa. Follow these instructions at home: Managing pain, stiffness, and swelling  If directed, apply ice to the injured area.  Put ice in a plastic bag.  Place a towel between your skin and the bag.  Leave the ice on for 20 minutes, 2-3 times a day. Activity  Rest and return to your normal activities as told by your health care provider. Ask your health care provider what activities are safe for you.  Do exercises as told by your health care provider. General instructions  Do not use any tobacco products, including cigarettes, chewing tobacco, or e-cigarettes. Tobacco can delay healing. If you need help quitting, ask your health care provider.  Ask your health care provider when it is safe for you to drive.  Take over-the-counter and prescription medicines only as told by your health care provider.  Keep all follow-up visits as told by your health care provider. This is important. How is this prevented?  Give your body time to rest between periods of activity.  Be safe and responsible  while being active to avoid falls.  Maintain physical fitness, including strength and flexibility. Contact a health care provider if:  Your symptoms have not improved after 1-2 months of treatment and rest.  You cannot lift your arm away from your body. This information is not intended to replace advice given to you by your health care provider. Make sure you  discuss any questions you have with your health care provider. Document Released: 05/22/2005 Document Revised: 01/27/2016 Document Reviewed: 04/24/2015 Elsevier Interactive Patient Education  2017 [employer].

## 2016-07-25 NOTE — Progress Notes (Signed)
Kelli [name] is a 71 y.o. female who presents to [place] today for right shoulder pain.   Helane notes a several month history of right shoulder pain. She notes pain occurring in September of 2017 that she attributed to increased lifting. She was seen in urgent care in October when she had a shoulder xray that showed AC DJD and some mild spur formation. She received a steroid injection likely to the subacromial space. This helped for about 1 month. She note that the pain recurred in November.  She denies any repeat injury. She notes that the pain occurs in the lateral upper arm and is worse with overhead motion and reaching back and at night. She has not tried much treatment other than an injection in October.    Past Medical History:  Diagnosis Date  . Anxiety   . Colon cancer ([place])    colon ca dx 02/2009  . Depression   . Depression with anxiety 09/19/2011  . H/O colon cancer, stage II 09/19/2011   T3N0  Cecum Sept 2010  . High cholesterol   . Hyperlipidemia 09/19/2011  . Lung nodules 09/19/2011   granulomas  . Urinary urgency    Past Surgical History:  Procedure Laterality Date  . APPENDECTOMY    . BREAST SURGERY  1978   augmentation  . COLON SURGERY     2010  . CRYO INTERCOSTAL NERVE BLOCK N/A 10/05/2014   Procedure: CRYO INTERCOSTAL NERVE BLOCK;  Surgeon: [name], MD;  Location: [place];  Service: Thoracic;  Laterality: N/A;  . LYMPH NODE DISSECTION Right 10/05/2014   Procedure: LYMPH NODE DISSECTION;  Surgeon: [name], MD;  Location: [place];  Service: Thoracic;  Laterality: Right;  . PARTIAL COLECTOMY Right 2010  . SEGMENTECOMY Right 10/05/2014   Procedure: right lower lobe SEGMENTECTOMY;  Surgeon: [name], MD;  Location: [facility];  Service: Thoracic;  Laterality: Right;  [name] VIDEO ASSISTED THORACOSCOPY (VATS)/WEDGE RESECTION Right 10/05/2014   Procedure: Right VIDEO ASSISTED THORACOSCOPY (VATS) with  right lower lobe lung nodule WEDGE RESECTION;  Surgeon: [name], MD;  Location: [facility];  Service: Thoracic;  Laterality: Right;   Social History  Substance Use Topics  . Smoking status: Former Smoker    Packs/day: 3.00    Years: 40.00    Types: Cigarettes    Quit date: 02/06/2009  . Smokeless tobacco: Never Used  . Alcohol use 0.0 oz/week     Comment: 3-4 times a week 1-2 beers     ROS:  As above   Medications: Current Outpatient Prescriptions  Medication Sig Dispense Refill  . aspirin 81 MG tablet Take 81 mg by mouth daily.    [name] b complex vitamins tablet Take 1 tablet by mouth daily.    . Cholecalciferol (VITAMIN D) 2000 UNITS tablet Take 2,000 Units by mouth daily.    [name] FLUoxetine (PROZAC) 20 MG capsule Take 2 capsules (40 mg total) by mouth daily. 180 capsule 3  . meloxicam (MOBIC) 7.5 MG tablet TAKE 1 TABLET BY MOUTH DAILY 90 tablet 0  . pravastatin (PRAVACHOL) 40 MG tablet Take 1 tablet (40 mg total) by mouth daily. 90 tablet 3   No current facility-administered medications for this visit.    Allergies  Allergen Reactions  . Oxycodone Other (See Comments)    Does not work- Hydrocodone does work     Exam:  BP 121/87   Pulse 71  General: Well Developed, well  nourished, and in no acute distress.  Neuro/Psych: Alert and oriented x3, extra-ocular muscles intact, able to move all 4 extremities, sensation grossly intact. Skin: Warm and dry, no rashes noted.  Respiratory: Not using accessory muscles, speaking in full sentences, trachea midline.  Cardiovascular: Pulses palpable, no extremity edema. Abdomen: Does not appear distended. MSK: Right shoulder is normal-appearing. Tender palpation overlying the acromioclavicular joint. Normal motion but painful arc. Positive Hawkins and Neer's test. Negative Yergason's and speeds test. Positive crossover arm compression test. Intact strength but pain with resisted abduction and external rotation  present.  Procedure: Real-time Ultrasound Guided Injection of right subacromial space  Device: GE Logiq E  Images permanently stored and available for review in the ultrasound unit. Verbal informed consent obtained. Discussed risks and benefits of procedure. Warned about infection bleeding damage to structures skin hypopigmentation and fat atrophy among others. Patient expresses understanding and agreement Time-out conducted.  Noted no overlying erythema, induration, or other signs of local infection.  Skin prepped in a sterile fashion.  Local anesthesia: Topical Ethyl chloride.  With sterile technique and under real time ultrasound guidance: '40mg'$  kenalog and 57m marcaine injected easily.  Completed without difficulty  Pain immediately resolved suggesting accurate placement of the medication.  Advised to call if fevers/chills, erythema, induration, drainage, or persistent bleeding.  Images permanently stored and available for review in the ultrasound unit.  Impression: Technically successful ultrasound guided injection.     Study Result   CLINICAL DATA:  Rt Shoulder pain from lifting a lot of heavy books. She strained her right shoulder. It has gradually gotten worse rather than better  EXAM: RIGHT SHOULDER - 2+ VIEW  COMPARISON:  None.  FINDINGS: No fracture.  No bone lesion.  Mild AC joint osteoarthritis reflected by mild joint space narrowing and small marginal osteophytes.  The glenohumeral joint is normally spaced and aligned. No significant arthropathic change.  There is a small subacromial spur.  Bones are demineralized.  Soft tissues are unremarkable.  IMPRESSION: 1. No fracture or dislocation. 2. Mild AC joint osteoarthritis. 3. Small subacromial spur.   Electronically Signed   By: DLajean ManesM.D.   On: 03/09/2016 15:57       No results found for this or any previous visit (from the past 48 hour(s)). No results  found.    Assessment and Plan: 71y.o. female with  Right shoulder pain very likely due to rotator cuff tendinopathy subacromial bursitis or impingement. There also is a component of acromioclavicular DJD. Patient had considerable improvement following subacromial injection today. Plan for injection as well as formal physical therapy.  Recheck in one month.  Doubtful that her cancer history has anything to do with her current pain however she is not improving will proceed with MRI with contrast to further evaluate potential metastatic disease as a cause of pain.    Orders Placed This Encounter  Procedures  . Ambulatory referral to Physical Therapy    Referral Priority:   Routine    Referral Type:   Physical Medicine    Referral Reason:   Specialty Services Required    Requested Specialty:   Physical Therapy    Number of Visits Requested:   1    Discussed warning signs or symptoms. Please see discharge instructions. Patient expresses understanding.

## 2016-07-25 NOTE — Progress Notes (Signed)
HPI: Kelli [name] is a 71 y.o. female  who presents to [facility] today, 07/25/16,  for chief complaint of:  Chief Complaint  Patient presents with  . Other    EKG    Palpitations  . Quality: Rapid heartbeat, heart pounding . Severity: Mild . Timing: nighttime, intermittent, sometimes once a week sometimes every few days . Modifying factors: Nothing seems to cause it or make it worse/better. . Assoc signs/symptoms: No chest pain or shortness of breath, no dizziness      Past medical history, surgical history, social history and family history reviewed.  Patient Active Problem List   Diagnosis Date Noted  . Need for vaccination for pneumococcus 07/13/2016  . Osteoarthritis of hand, right 08/06/2015  . Diabetes mellitus type 2, uncomplicated ([place]) 35/36/1443  . Lung cancer, lower lobe ([place]) 10/09/2014  . Hematochezia 04/01/2013  . H/O colon cancer, stage II 09/19/2011  . Depression with anxiety 09/19/2011  . Hyperlipidemia 09/19/2011    Current medication list and allergy/intolerance information reviewed.   Current Outpatient Prescriptions on File Prior to Visit  Medication Sig Dispense Refill  . aspirin 81 MG tablet Take 81 mg by mouth daily.    [name] b complex vitamins tablet Take 1 tablet by mouth daily.    . Cholecalciferol (VITAMIN D) 2000 UNITS tablet Take 2,000 Units by mouth daily.    [name] FLUoxetine (PROZAC) 20 MG capsule Take 2 capsules (40 mg total) by mouth daily. 180 capsule 3  . meloxicam (MOBIC) 7.5 MG tablet TAKE 1 TABLET BY MOUTH DAILY 90 tablet 0  . pravastatin (PRAVACHOL) 40 MG tablet Take 1 tablet (40 mg total) by mouth daily. 90 tablet 3   No current facility-administered medications on file prior to visit.    Allergies  Allergen Reactions  . Oxycodone Other (See Comments)    Does not work- Hydrocodone does work      Review of Systems:  Constitutional: No recent illness  Cardiac: No  chest pain, No  pressure,  +palpitations  Respiratory:  No  shortness of breath. No  Cough  Neurologic: No  weakness, No  Dizziness   Exam:  BP 121/87   Pulse 71   Ht '5\' 9"'$  (1.753 m)   Wt 173 lb (78.5 kg)   BMI 25.55 kg/m   Constitutional: VS see above. General Appearance: alert, well-developed, well-nourished, NAD  Eyes: Normal lids and conjunctive, non-icteric sclera  Ears, Nose, Mouth, Throat: MMM, Normal external inspection ears/nares/mouth/lips/gums.  Neck: No masses, trachea midline.   Respiratory: Normal respiratory effort. no wheeze, no rhonchi, no rales  Cardiovascular: S1/S2 normal, no murmur, no rub/gallop auscultated. RRR.   Musculoskeletal: Gait normal. Symmetric and independent movement of all extremities  Neurological: Normal balance/coordination. No tremor.  Skin: warm, dry, intact.   Psychiatric: Normal judgment/insight. Normal mood and affect. Oriented x3.    EKG interpretation: Rate: 75 Rhythm: sinus Q waves II No ST/T changes concerning for acute ischemia/infarct    ASSESSMENT/PLAN:   Heart palpitations - Plan: EKG 12-Lead, Holter monitor - 48 hour, ECHOCARDIOGRAM COMPLETE    Follow-up plan: Return in about 6 weeks (around 09/05/2016) for discuss results from heart workup .  Visit summary with medication list and pertinent instructions was printed for patient to review, alert [place] if any changes needed. All questions at time of visit were answered - patient instructed to contact office with any additional concerns. ER/RTC precautions were reviewed with the patient and understanding verbalized.

## 2016-07-26 LAB — CBC WITH DIFFERENTIAL/PLATELET
Basophils Absolute: 0 cells/uL (ref 0–200)
Basophils Relative: 0 %
Eosinophils Absolute: 300 cells/uL (ref 15–500)
Eosinophils Relative: 4 %
HCT: 38.6 % (ref 35.0–45.0)
HEMOGLOBIN: 12.6 g/dL (ref 11.7–15.5)
LYMPHS ABS: 2025 {cells}/uL (ref 850–3900)
Lymphocytes Relative: 27 %
MCH: 30.5 pg (ref 27.0–33.0)
MCHC: 32.6 g/dL (ref 32.0–36.0)
MCV: 93.5 fL (ref 80.0–100.0)
MONO ABS: 525 {cells}/uL (ref 200–950)
MPV: 10.5 fL (ref 7.5–12.5)
Monocytes Relative: 7 %
NEUTROS PCT: 62 %
Neutro Abs: 4650 cells/uL (ref 1500–7800)
Platelets: 336 10*3/uL (ref 140–400)
RBC: 4.13 MIL/uL (ref 3.80–5.10)
RDW: 13.3 % (ref 11.0–15.0)
WBC: 7.5 10*3/uL (ref 3.8–10.8)

## 2016-07-26 LAB — COMPLETE METABOLIC PANEL WITH GFR
ALBUMIN: 4.3 g/dL (ref 3.6–5.1)
ALK PHOS: 85 U/L (ref 33–130)
ALT: 18 U/L (ref 6–29)
AST: 18 U/L (ref 10–35)
BILIRUBIN TOTAL: 0.5 mg/dL (ref 0.2–1.2)
BUN: 14 mg/dL (ref 7–25)
CO2: 26 mmol/L (ref 20–31)
CREATININE: 0.8 mg/dL (ref 0.50–0.99)
Calcium: 9.5 mg/dL (ref 8.6–10.4)
Chloride: 103 mmol/L (ref 98–110)
GFR, Est African American: 88 mL/min (ref >=60)
GFR, Est Non African American: 76 mL/min (ref >=60)
GLUCOSE: 183 mg/dL — AB (ref 65–99)
POTASSIUM: 4.4 mmol/L (ref 3.5–5.3)
SODIUM: 139 mmol/L (ref 135–146)
TOTAL PROTEIN: 6.9 g/dL (ref 6.1–8.1)

## 2016-07-26 LAB — LIPID PANEL
CHOLESTEROL: 244 mg/dL — AB (ref <200)
HDL: 47 mg/dL — ABNORMAL LOW (ref >50)
LDL Cholesterol: 150 mg/dL — ABNORMAL HIGH (ref <100)
Total CHOL/HDL Ratio: 5.2 Ratio — ABNORMAL HIGH (ref <5.0)
Triglycerides: 237 mg/dL — ABNORMAL HIGH (ref <150)
VLDL: 47 mg/dL — ABNORMAL HIGH (ref <30)

## 2016-07-26 LAB — VITAMIN D 25 HYDROXY (VIT D DEFICIENCY, FRACTURES): VIT D 25 HYDROXY: 35 ng/mL (ref 30–100)

## 2016-07-26 LAB — TSH: TSH: 2.34 m[IU]/L

## 2016-07-26 LAB — HEPATITIS C ANTIBODY: HCV AB: NEGATIVE

## 2016-07-27 MED ORDER — ATORVASTATIN CALCIUM 40 MG PO TABS: 40.0000 mg | ORAL_TABLET | Freq: Every day | ORAL | 1 refills | Status: DC

## 2016-07-27 NOTE — Addendum Note (Signed)
Addended by: Maryla Morrow on: 07/27/2016 03:15 PM   Modules accepted: Orders

## 2016-08-03 ENCOUNTER — Ambulatory Visit (INDEPENDENT_AMBULATORY_CARE_PROVIDER_SITE_OTHER): Payer: PPO | Admitting: Physical Therapy

## 2016-08-03 DIAGNOSIS — M25511 Pain in right shoulder: Secondary | ICD-10-CM

## 2016-08-03 DIAGNOSIS — R293 Abnormal posture: Secondary | ICD-10-CM

## 2016-08-03 NOTE — Therapy (Signed)
[facility], [place], [address] Phone: [phone]   Fax:  [phone]  Physical Therapy Evaluation  Patient Details  Name: Kelli [name] MRN: [id number] Date of Birth: 1946/01/30 Referring Provider: Gregor Hams, MD  Encounter Date: 08/03/2016      PT End of Session - 08/03/16 1017    Visit Number 1   Number of Visits 12   Date for PT Re-Evaluation 09/14/16   Authorization Type [employer]   PT Start Time 1017   PT Stop Time 1101   PT Time Calculation (min) 44 min   Activity Tolerance Patient tolerated treatment well   Behavior During Therapy [facility] for tasks assessed/performed      Past Medical History:  Diagnosis Date  . Anxiety   . Colon cancer ([place])    colon ca dx 02/2009  . Depression   . Depression with anxiety 09/19/2011  . H/O colon cancer, stage II 09/19/2011   T3N0  Cecum Sept 2010  . High cholesterol   . Hyperlipidemia 09/19/2011  . Lung nodules 09/19/2011   granulomas  . Urinary urgency     Past Surgical History:  Procedure Laterality Date  . APPENDECTOMY    . BREAST SURGERY  1978   augmentation  . COLON SURGERY     2010  . CRYO INTERCOSTAL NERVE BLOCK N/A 10/05/2014   Procedure: CRYO INTERCOSTAL NERVE BLOCK;  Surgeon: [name], MD;  Location: [facility];  Service: Thoracic;  Laterality: N/A;  . LYMPH NODE DISSECTION Right 10/05/2014   Procedure: LYMPH NODE DISSECTION;  Surgeon: [name], MD;  Location: [facility];  Service: Thoracic;  Laterality: Right;  . PARTIAL COLECTOMY Right 2010  . SEGMENTECOMY Right 10/05/2014   Procedure: right lower lobe SEGMENTECTOMY;  Surgeon: [name], MD;  Location: [facility];  Service: Thoracic;  Laterality: Right;  [name] VIDEO ASSISTED THORACOSCOPY (VATS)/WEDGE RESECTION Right 10/05/2014   Procedure: Right VIDEO ASSISTED THORACOSCOPY (VATS) with right lower lobe lung nodule WEDGE RESECTION;  Surgeon: [name],  MD;  Location: [place];  Service: Thoracic;  Laterality: Right;    There were no vitals filed for this visit.       Subjective Assessment - 08/03/16 1022    Subjective Pt reports pain started back in Sept 2017 at which time she thought she pulled a muscle. Received a cortisone shot which helped, but then about a month later a new pain surfaced. More recently saw Dr. [name] who gave her another shot which also helped. Pain prior to shot was not too bad when raising arm overhead, but much more painful to lower.   Diagnostic tests R shoulder x-ray 03/09/16: No fracture or dislocation; Mild AC joint osteoarthritis; Small subacromial spur.   Patient Stated Goals "keep the pain from coming back"   Currently in Pain? No/denies   Pain Score 0-No pain  up to 10/10 before injection, resolved after injection   Pain Location Shoulder   Pain Orientation Right   Pain Type Acute pain   Pain Onset More than a month ago   Pain Frequency Intermittent   Aggravating Factors  reaching behind, lowering arm from overhead   Pain Relieving Factors injection   Effect of Pain on Daily Activities "pampers R arm"            [facility] PT Assessment - 08/03/16 1017      Assessment   Medical Diagnosis Acute R shoulder pain   Referring Provider  Gregor Hams, MD   Onset Date/Surgical Date --  Sept 2017   Hand Dominance Right   Next MD Visit 08/22/16   Prior Therapy uncertain - maybe years ago     Balance Screen   Has the patient fallen in the past 6 months No   Has the patient had a decrease in activity level because of a fear of falling?  No   Is the patient reluctant to leave their home because of a fear of falling?  No     Home Environment   Living Environment Private residence   Living Arrangements Alone   Type of [place] Access Level entry   Home Layout One level     Prior Function   Level of [place] Retired   Leisure decorating new house; mostly sedentary      Observation/Other Assessments   Focus on Therapeutic Outcomes (FOTO)  Shoulder - 57% (43% limitation); predicted 66% (34% limitation)     Posture/Postural Control   Posture/Postural Control Postural limitations   Postural Limitations Forward head;Rounded Shoulders;Increased thoracic kyphosis   Posture Comments mild increased kyphosis with B scapula rounded anteriorly on ribcage     ROM / Strength   AROM / PROM / Strength AROM;Strength     AROM   Overall AROM  Within functional limits for tasks performed   Overall AROM Comments tightness reported B at end range with pulling in thoracic spine but no pain   AROM Assessment Site Shoulder     Strength   Strength Assessment Site Shoulder   Right/Left Shoulder Right;Left   Right Shoulder Flexion 4/5   Right Shoulder Extension 4/5   Right Shoulder ABduction 4/5   Right Shoulder Internal Rotation 4/5   Right Shoulder External Rotation 4-/5   Left Shoulder Flexion 4+/5   Left Shoulder Extension 4+/5   Left Shoulder ABduction 4+/5   Left Shoulder Internal Rotation 4+/5   Left Shoulder External Rotation 4+/5     Palpation   Palpation comment increased muscle tension in UT & posterior shoulder girdle                   [facility] Adult PT Treatment/Exercise - 08/03/16 1017      Exercises   Exercises Shoulder     Shoulder Exercises: Seated   Other Seated Exercises axial extension 10x10"     Shoulder Exercises: Standing   Horizontal ABduction Both;10 reps;Theraband   Theraband Level (Shoulder Horizontal ABduction) Level 1 (Yellow)   External Rotation Both;10 reps;Theraband   Theraband Level (Shoulder External Rotation) Level 1 (Yellow)   Retraction Both;10 reps   Retraction Limitations 10" hold against pool noodle                PT Education - 08/03/16 1101    Education provided Yes   Education Details PT eval findings, POC, postural education & initial HEP   Person(s) Educated Patient   Methods  Explanation;Demonstration;Handout   Comprehension Verbalized understanding;Returned demonstration;Need further instruction             PT Long Term Goals - 08/03/16 1101      PT LONG TERM GOAL #1   Title Independent with HEP and/or gym program by 09/14/16   Time 6   Period Weeks   Status New     PT LONG TERM GOAL #2   Title Pt will routinely demonstrate neutral spine and shoulder posture with <25% cues by 09/14/16  Time 6   Period Weeks   Status New     PT LONG TERM GOAL #3   Title R shoulder strength >/= 4+/5 by 09/14/16   Time 6   Period Weeks   Status New     PT LONG TERM GOAL #4   Title Pt will be able to perform all ADLs and household chores with good body mechanics and no limitations due to R shoulder pain by 09/14/16   Time 6   Period Weeks   Status New     PT LONG TERM GOAL #5   Title Improve FOTO =/< 34% limited by 09/14/16   Time 6   Period Weeks   Status New               Plan - 08/03/16 1101    Clinical Impression Statement Shareeka is a 71 y/o female who presents to [facility] for acute R shoulder pain originating in Sept 2017 and reoccurring a few months later. Each time pain relieved by cortisone injections, however after 2nd injection, MD recommended PT to prevent future return of pain. Pt denies pain at time of eval or since most recent injection took effect. Assessment revealed significant postural limitations contributing to increased muscle tension in upper and posterior shoulder bilaterally, poor biomechanics for shoulder motion overhead and high risk for impingement, although ROM currently WNL. Strength mildly decreased on R vs L. POC will focus on improving postural awareness including increasing muscle flexibility along with soft tissue pliability, scapular strengthening/stabilization and R shoulder strengthening, with manual therapy and modalities PRN for pain and normalization of muscle tension.   Rehab Potential Good   PT Frequency 2x / week   PT  Duration 6 weeks   PT Treatment/Interventions Patient/family education;Neuromuscular re-education;Therapeutic exercise;Manual techniques;Dry needling;Taping;ADLs/Self Care Home Management;Electrical Stimulation;Cryotherapy;Vasopneumatic Device;Moist Heat;Ultrasound   PT Next Visit Plan postural correction; scapular strengthening/stabilization; shoulder strengthening   Consulted and Agree with Plan of Care Patient      Patient will benefit from skilled therapeutic intervention in order to improve the following deficits and impairments:  Pain, Postural dysfunction, Decreased strength, Impaired UE functional use, Decreased activity tolerance  Visit Diagnosis: Acute pain of right shoulder  Abnormal posture     Problem List Patient Active Problem List   Diagnosis Date Noted  . Heart palpitations 07/25/2016  . Need for vaccination for pneumococcus 07/13/2016  . Osteoarthritis of hand, right 08/06/2015  . Diabetes mellitus type 2, uncomplicated ([place]) 93/96/8864  . Lung cancer, lower lobe ([place]) 10/09/2014  . Hematochezia 04/01/2013  . H/O colon cancer, stage II 09/19/2011  . Depression with anxiety 09/19/2011  . Hyperlipidemia 09/19/2011    Percival Spanish, PT, MPT 08/03/2016, 6:11 PM  [facility] [place], [place], [address] Phone: [phone]   Fax:  [phone]  Name: QUINTA EIMER MRN: [id number] Date of Birth: 1946/01/06

## 2016-08-03 NOTE — Patient Instructions (Signed)

## 2016-08-04 ENCOUNTER — Ambulatory Visit (INDEPENDENT_AMBULATORY_CARE_PROVIDER_SITE_OTHER): Payer: PPO

## 2016-08-04 DIAGNOSIS — R002 Palpitations: Secondary | ICD-10-CM

## 2016-08-08 ENCOUNTER — Ambulatory Visit (INDEPENDENT_AMBULATORY_CARE_PROVIDER_SITE_OTHER): Payer: PPO | Admitting: Physical Therapy

## 2016-08-08 DIAGNOSIS — M25511 Pain in right shoulder: Secondary | ICD-10-CM

## 2016-08-08 DIAGNOSIS — R293 Abnormal posture: Secondary | ICD-10-CM

## 2016-08-08 NOTE — Therapy (Signed)
[place], [place], [address] Phone: [phone]   Fax:  [phone]  Physical Therapy Treatment  Patient Details  Name: Kelli [name] MRN: [id number] Date of Birth: Oct 17, 1945 Referring Provider: Gregor Hams, MD  Encounter Date: 08/08/2016      PT End of Session - 08/08/16 1236    Visit Number 2   Number of Visits 12   Date for PT Re-Evaluation 09/14/16   PT Start Time 1150   PT Stop Time 1228   PT Time Calculation (min) 38 min   Activity Tolerance Patient tolerated treatment well   Behavior During Therapy [facility] for tasks assessed/performed      Past Medical History:  Diagnosis Date  . Anxiety   . Colon cancer ([place])    colon ca dx 02/2009  . Depression   . Depression with anxiety 09/19/2011  . H/O colon cancer, stage II 09/19/2011   T3N0  Cecum Sept 2010  . High cholesterol   . Hyperlipidemia 09/19/2011  . Lung nodules 09/19/2011   granulomas  . Urinary urgency     Past Surgical History:  Procedure Laterality Date  . APPENDECTOMY    . BREAST SURGERY  1978   augmentation  . COLON SURGERY     2010  . CRYO INTERCOSTAL NERVE BLOCK N/A 10/05/2014   Procedure: CRYO INTERCOSTAL NERVE BLOCK;  Surgeon: [name], MD;  Location: [place];  Service: Thoracic;  Laterality: N/A;  . LYMPH NODE DISSECTION Right 10/05/2014   Procedure: LYMPH NODE DISSECTION;  Surgeon: [name], MD;  Location: [place];  Service: Thoracic;  Laterality: Right;  . PARTIAL COLECTOMY Right 2010  . SEGMENTECOMY Right 10/05/2014   Procedure: right lower lobe SEGMENTECTOMY;  Surgeon: [name], MD;  Location: [place];  Service: Thoracic;  Laterality: Right;  [name] VIDEO ASSISTED THORACOSCOPY (VATS)/WEDGE RESECTION Right 10/05/2014   Procedure: Right VIDEO ASSISTED THORACOSCOPY (VATS) with right lower lobe lung nodule WEDGE RESECTION;  Surgeon: [name], MD;  Location: [place];  Service: Thoracic;   Laterality: Right;    There were no vitals filed for this visit.      Subjective Assessment - 08/08/16 1153    Subjective Pt reports she has not had any pain in Rt shoulder.  She has not done any exercises, as she "didn't know she was supposed to do them".    Patient Stated Goals "keep the pain from coming back"   Currently in Pain? No/denies   Pain Score 0-No pain   Pain Location Shoulder   Pain Orientation Right            [facility] PT Assessment - 08/08/16 0001      Strength   Right/Left Shoulder Right   Right Shoulder Flexion --  5-/5   Right Shoulder Extension 5/5   Right Shoulder ABduction --  5-/5   Right Shoulder Internal Rotation 4+/5   Right Shoulder External Rotation 4+/5          [facility] Adult PT Treatment/Exercise - 08/08/16 0001      Shoulder Exercises: Seated   Other Seated Exercises axial extension 5 x 10"   Other Seated Exercises scap squeeze x 5 sec x 10 reps;  RUE sash with yellow band x 10 reps, red band x 10 reps      Shoulder Exercises: Standing   Horizontal ABduction Both;10 reps;Theraband   Theraband Level (Shoulder Horizontal ABduction) Level 1 (Yellow)   External Rotation  Both;10 reps;Theraband  2 sets   Theraband Level (Shoulder External Rotation) Level 1 (Yellow);Level 2 (Red)   Row Strengthening;Both;10 reps;Theraband   Theraband Level (Shoulder Row) Level 2 (Red)     Shoulder Exercises: Stretch   Other Shoulder Stretches doorway stretch, 3 positions x 30 sec x 2 reps.            PT Long Term Goals - 08/08/16 1236      PT LONG TERM GOAL #1   Title Independent with HEP and/or gym program by 09/14/16   Time 6   Period Weeks   Status On-going     PT LONG TERM GOAL #2   Title Pt will routinely demonstrate neutral spine and shoulder posture with <25% cues by 09/14/16   Time 6   Period Weeks   Status On-going     PT LONG TERM GOAL #3   Title R shoulder strength >/= 4+/5 by 09/14/16   Time 6   Period Weeks   Status On-going      PT LONG TERM GOAL #4   Title Pt will be able to perform all ADLs and household chores with good body mechanics and no limitations due to R shoulder pain by 09/14/16   Time 6   Period Weeks   Status On-going     PT LONG TERM GOAL #5   Title Improve FOTO =/< 34% limited by 09/14/16   Time 6   Period Weeks   Status On-going               Plan - 08/08/16 1238    Clinical Impression Statement Pt tolerated all exercises well, even tolerated increased resistance without any increase in symptoms. She demonstrated improved Rt shoulder strength, despite not completing HEP over last wk.  Pt required multiple VC to stay on task (very talkative).  She also required freq cues for posture.    Rehab Potential Good   PT Frequency 2x / week   PT Duration 6 weeks   PT Treatment/Interventions Patient/family education;Neuromuscular re-education;Therapeutic exercise;Manual techniques;Dry needling;Taping;ADLs/Self Care Home Management;Electrical Stimulation;Cryotherapy;Vasopneumatic Device;Moist Heat;Ultrasound   PT Next Visit Plan postural correction; scapular strengthening/stabilization; shoulder strengthening   Consulted and Agree with Plan of Care Patient      Patient will benefit from skilled therapeutic intervention in order to improve the following deficits and impairments:  Pain, Postural dysfunction, Decreased strength, Impaired UE functional use, Decreased activity tolerance  Visit Diagnosis: Acute pain of right shoulder  Abnormal posture     Problem List Patient Active Problem List   Diagnosis Date Noted  . Heart palpitations 07/25/2016  . Need for vaccination for pneumococcus 07/13/2016  . Osteoarthritis of hand, right 08/06/2015  . Diabetes mellitus type 2, uncomplicated ([place]) 25/95/6387  . Lung cancer, lower lobe ([place]) 10/09/2014  . Hematochezia 04/01/2013  . H/O colon cancer, stage II 09/19/2011  . Depression with anxiety 09/19/2011  . Hyperlipidemia 09/19/2011    Kerin Perna, PTA 08/08/16 12:42 PM  [place], [place], [address] Phone: [phone]   Fax:  [phone]  Name: Kelli [name] MRN: [id number] Date of Birth: 02/29/1946

## 2016-08-10 ENCOUNTER — Ambulatory Visit (INDEPENDENT_AMBULATORY_CARE_PROVIDER_SITE_OTHER): Payer: PPO | Admitting: Physical Therapy

## 2016-08-10 DIAGNOSIS — M25511 Pain in right shoulder: Secondary | ICD-10-CM | POA: Diagnosis not present

## 2016-08-10 DIAGNOSIS — R293 Abnormal posture: Secondary | ICD-10-CM | POA: Diagnosis not present

## 2016-08-10 NOTE — Therapy (Addendum)
[place] [address] [place], [place], [address] Phone: [phone]   Fax:  [phone]  Physical Therapy Treatment  Patient Details  Name: Kelli [name] MRN: [id number] Date of Birth: 1945-12-08 Referring Provider: Gregor Hams, MD  Encounter Date: 08/10/2016      PT End of Session - 08/10/16 1535    Visit Number 3   Number of Visits 12   Date for PT Re-Evaluation 09/14/16   PT Start Time 1532   PT Stop Time 1600   PT Time Calculation (min) 28 min   Activity Tolerance Patient tolerated treatment well   Behavior During Therapy [facility] for tasks assessed/performed      Past Medical History:  Diagnosis Date  . Anxiety   . Colon cancer ([place])    colon ca dx 02/2009  . Depression   . Depression with anxiety 09/19/2011  . H/O colon cancer, stage II 09/19/2011   T3N0  Cecum Sept 2010  . High cholesterol   . Hyperlipidemia 09/19/2011  . Lung nodules 09/19/2011   granulomas  . Urinary urgency     Past Surgical History:  Procedure Laterality Date  . APPENDECTOMY    . BREAST SURGERY  1978   augmentation  . COLON SURGERY     2010  . CRYO INTERCOSTAL NERVE BLOCK N/A 10/05/2014   Procedure: CRYO INTERCOSTAL NERVE BLOCK;  Surgeon: [name], MD;  Location: [place];  Service: Thoracic;  Laterality: N/A;  . LYMPH NODE DISSECTION Right 10/05/2014   Procedure: LYMPH NODE DISSECTION;  Surgeon: [name], MD;  Location: [place];  Service: Thoracic;  Laterality: Right;  . PARTIAL COLECTOMY Right 2010  . SEGMENTECOMY Right 10/05/2014   Procedure: right lower lobe SEGMENTECTOMY;  Surgeon: [name], MD;  Location: [place];  Service: Thoracic;  Laterality: Right;  [name] VIDEO ASSISTED THORACOSCOPY (VATS)/WEDGE RESECTION Right 10/05/2014   Procedure: Right VIDEO ASSISTED THORACOSCOPY (VATS) with right lower lobe lung nodule WEDGE RESECTION;  Surgeon: [name], MD;  Location: [place];  Service: Thoracic;   Laterality: Right;    There were no vitals filed for this visit.      Subjective Assessment - 08/10/16 1535    Subjective "I don't think I'm going to need to come here much longer".  Pt requests repeat copies of HEP.   No new changes since last visit.  She requests to d/c at end of today's session.    Patient Stated Goals "keep the pain from coming back"   Currently in Pain? No/denies   Pain Score 0-No pain            [facility] PT Assessment - 08/10/16 0001      Observation/Other Assessments   Focus on Therapeutic Outcomes (FOTO)  28% limited.      Strength   Right/Left Shoulder Right   Right Shoulder Internal Rotation --  5-/5   Right Shoulder External Rotation --  5-/5           [facility] Adult PT Treatment/Exercise - 08/10/16 0001      Shoulder Exercises: Standing   Horizontal ABduction Strengthening;Both;10 reps   Theraband Level (Shoulder Horizontal ABduction) Level 2 (Red)   External Rotation Strengthening;Both;10 reps   Theraband Level (Shoulder External Rotation) Level 3 (Green)   Row Strengthening;Both;10 reps;Theraband   Theraband Level (Shoulder Row) Level 3 (Green)   Other Standing Exercises Sash with red band x 10 reps .      Shoulder Exercises:  ROM/Strengthening   UBE (Upper Arm Bike) L1: 2 min forward, 1 min backward.      Shoulder Exercises: Stretch   Other Shoulder Stretches doorway stretch, 3 positions x 30 sec x 2 reps.                 PT Education - 08/10/16 1544    Education provided Yes   Education Details HEP    Person(s) Educated Patient   Methods Explanation;Handout;Demonstration   Comprehension Verbalized understanding;Returned demonstration             PT Long Term Goals - 08/10/16 1537      PT LONG TERM GOAL #1   Title Independent with HEP and/or gym program by 09/14/16   Time 6   Period Weeks   Status Achieved     PT LONG TERM GOAL #2   Title Pt will routinely demonstrate neutral spine and shoulder posture with  <25% cues by 09/14/16   Time 6   Period Weeks   Status Not Met  pt required ongoing cues      PT LONG TERM GOAL #3   Title R shoulder strength >/= 4+/5 by 09/14/16   Time 6   Period Weeks   Status Achieved     PT LONG TERM GOAL #4   Title Pt will be able to perform all ADLs and household chores with good body mechanics and no limitations due to R shoulder pain by 09/14/16   Time 6   Period Weeks   Status Achieved     PT LONG TERM GOAL #5   Title Improve FOTO =/< 34% limited by 09/14/16   Time 6   Period Weeks   Status Achieved               Plan - 08/10/16 1605    Clinical Impression Statement Pt tolerated all exercises without any symptoms.  Pt has met all goals except her posture goal.  Pt requests to d/c to HEP at this time.    Rehab Potential Good   PT Frequency 2x / week   PT Duration 6 weeks   PT Treatment/Interventions Patient/family education;Neuromuscular re-education;Therapeutic exercise;Manual techniques;Dry needling;Taping;ADLs/Self Care Home Management;Electrical Stimulation;Cryotherapy;Vasopneumatic Device;Moist Heat;Ultrasound   PT Next Visit Plan spoke to supervising PT regarding pt's progress and desire to d/c.  Will d/c to HEP at this time.    Consulted and Agree with Plan of Care Patient      Patient will benefit from skilled therapeutic intervention in order to improve the following deficits and impairments:  Pain, Postural dysfunction, Decreased strength, Impaired UE functional use, Decreased activity tolerance  Visit Diagnosis: Acute pain of right shoulder  Abnormal posture     Problem List Patient Active Problem List   Diagnosis Date Noted  . Heart palpitations 07/25/2016  . Need for vaccination for pneumococcus 07/13/2016  . Osteoarthritis of hand, right 08/06/2015  . Diabetes mellitus type 2, uncomplicated ([place]) 02/25/3006  . Lung cancer, lower lobe ([place]) 10/09/2014  . Hematochezia 04/01/2013  . H/O colon cancer, stage II 09/19/2011   . Depression with anxiety 09/19/2011  . Hyperlipidemia 09/19/2011   Kerin Perna, PTA 08/10/16 5:06 PM   Celyn P. Helene Kelp PT, MPH 08/10/16 5:06 PM    [facility] [place], [place], [address] Phone: [phone]   Fax:  [phone]  Name: Kelli [name] MRN: [id number] Date of Birth: January 16, 1946  PHYSICAL THERAPY DISCHARGE SUMMARY  Visits from Start of Care:  3  Current functional level related to goals / functional outcomes: See note for discharge status   Remaining deficits: See note    Education / Equipment: HEP Plan: Patient agrees to discharge.  Patient goals were partially met. Patient is being discharged due to being pleased with the current functional level.  ?????    Celyn P. Helene Kelp PT, MPH 08/10/16 5:06 PM

## 2016-08-10 NOTE — Patient Instructions (Addendum)
Posture Tips DO: - stand tall and erect - keep chin tucked in - keep head and shoulders in alignment - check posture regularly in mirror or large window - pull head back against headrest in car seat;  Change your position often.  Sit with lumbar support. DON'T: - slouch or slump while watching TV or reading - sit, stand or lie in one position  for too long;  Sitting is especially hard on the spine so if you sit at a desk/use the computer, then stand up often!   Copyright  VHI. All rights reserved.  Posture - Standing   Good posture is important. Avoid slouching and forward head thrust. Maintain curve in low back and align ears over shoul- ders, hips over ankles.  Pull your belly button in toward your back bone.   Copyright  VHI. All rights reserved.  Posture - Sitting   Sit upright, head facing forward. Try using a roll to support lower back. Keep shoulders relaxed, and avoid rounded back. Keep hips level with knees. Avoid crossing legs for long periods.   Copyright  VHI. All rights reserved.     Scapula Adduction With Pectorals, Low   (Home) PNF: D2 Flexion - Unilateral    Opposite side toward anchor, right arm down, across body, thumb down, pull arm up and out, rotating to thumb up. Follow hand with head and eyes. Repeat _10___ times per set. Do __1-2__ sets per session. Do __3__ sessions per week.   Scapula Adduction With Pectorals, Mid-Range   Stand in doorframe with palms against frame and arms at 90. Lean forward and squeeze shoulder blades. Hold _30_ seconds. Repeat _2__ times per session. Do __1-2_ sessions per day.

## 2016-08-14 ENCOUNTER — Ambulatory Visit (HOSPITAL_COMMUNITY): Payer: PPO

## 2016-08-14 ENCOUNTER — Other Ambulatory Visit (HOSPITAL_COMMUNITY): Payer: PPO

## 2016-08-15 ENCOUNTER — Encounter: Payer: PPO | Admitting: Physical Therapy

## 2016-08-17 ENCOUNTER — Encounter: Payer: PPO | Admitting: Physical Therapy

## 2016-08-18 ENCOUNTER — Other Ambulatory Visit: Payer: Self-pay

## 2016-08-18 ENCOUNTER — Ambulatory Visit (HOSPITAL_COMMUNITY): Payer: PPO | Attending: Cardiology

## 2016-08-18 DIAGNOSIS — R002 Palpitations: Secondary | ICD-10-CM | POA: Diagnosis not present

## 2016-08-18 DIAGNOSIS — I5189 Other ill-defined heart diseases: Secondary | ICD-10-CM | POA: Diagnosis not present

## 2016-08-21 ENCOUNTER — Encounter: Payer: Self-pay | Admitting: Osteopathic Medicine

## 2016-08-21 DIAGNOSIS — I519 Heart disease, unspecified: Secondary | ICD-10-CM | POA: Insufficient documentation

## 2016-08-21 DIAGNOSIS — I5189 Other ill-defined heart diseases: Secondary | ICD-10-CM | POA: Insufficient documentation

## 2016-08-22 ENCOUNTER — Ambulatory Visit (INDEPENDENT_AMBULATORY_CARE_PROVIDER_SITE_OTHER): Payer: PPO | Admitting: Family Medicine

## 2016-08-22 VITALS — BP 134/64 | HR 76 | Wt 177.0 lb

## 2016-08-22 DIAGNOSIS — Z23 Encounter for immunization: Secondary | ICD-10-CM | POA: Diagnosis not present

## 2016-08-22 DIAGNOSIS — M25511 Pain in right shoulder: Secondary | ICD-10-CM | POA: Diagnosis not present

## 2016-08-22 NOTE — Progress Notes (Signed)
Kelli [name] is a 71 y.o. female who presents to [place] [place] today for follow-up right shoulder pain. Patient was seen last month for right shoulder pain and was referred to physical therapy and had a subacromial bursa injection. She notes complete resolution of her pain and feels great.   Past Medical History:  Diagnosis Date  . Anxiety   . Colon cancer ([place])    colon ca dx 02/2009  . Depression   . Depression with anxiety 09/19/2011  . H/O colon cancer, stage II 09/19/2011   T3N0  Cecum Sept 2010  . High cholesterol   . Hyperlipidemia 09/19/2011  . Lung nodules 09/19/2011   granulomas  . Urinary urgency    Past Surgical History:  Procedure Laterality Date  . APPENDECTOMY    . BREAST SURGERY  1978   augmentation  . COLON SURGERY     2010  . CRYO INTERCOSTAL NERVE BLOCK N/A 10/05/2014   Procedure: CRYO INTERCOSTAL NERVE BLOCK;  Surgeon: [name], MD;  Location: [place];  Service: Thoracic;  Laterality: N/A;  . LYMPH NODE DISSECTION Right 10/05/2014   Procedure: LYMPH NODE DISSECTION;  Surgeon: [name], MD;  Location: [place];  Service: Thoracic;  Laterality: Right;  . PARTIAL COLECTOMY Right 2010  . SEGMENTECOMY Right 10/05/2014   Procedure: right lower lobe SEGMENTECTOMY;  Surgeon: [name], MD;  Location: [place];  Service: Thoracic;  Laterality: Right;  [name] VIDEO ASSISTED THORACOSCOPY (VATS)/WEDGE RESECTION Right 10/05/2014   Procedure: Right VIDEO ASSISTED THORACOSCOPY (VATS) with right lower lobe lung nodule WEDGE RESECTION;  Surgeon: [name], MD;  Location: [place];  Service: Thoracic;  Laterality: Right;   Social History  Substance Use Topics  . Smoking status: Former Smoker    Packs/day: 3.00    Years: 40.00    Types: Cigarettes    Quit date: 02/06/2009  . Smokeless tobacco: Never Used  . Alcohol use 0.0 oz/week   Comment: 3-4 times a week 1-2 beers   family history includes Heart disease in her brother and mother.  ROS as above:  Medications: Current Outpatient Prescriptions  Medication Sig Dispense Refill  . aspirin 81 MG tablet Take 81 mg by mouth daily.    [name] atorvastatin (LIPITOR) 40 MG tablet Take 1 tablet (40 mg total) by mouth daily. 90 tablet 1  . b complex vitamins tablet Take 1 tablet by mouth daily.    . Cholecalciferol (VITAMIN D) 2000 UNITS tablet Take 2,000 Units by mouth daily.    [name] FLUoxetine (PROZAC) 20 MG capsule Take 2 capsules (40 mg total) by mouth daily. 180 capsule 3  . meloxicam (MOBIC) 7.5 MG tablet TAKE 1 TABLET BY MOUTH DAILY 90 tablet 0   No current facility-administered medications for this visit.    Allergies  Allergen Reactions  . Oxycodone Other (See Comments)    Does not work- Hydrocodone does work    Dietitian  Topic Date Due  . FOOT EXAM  01/20/1958  . OPHTHALMOLOGY EXAM  01/20/1958  . MAMMOGRAM  06/05/2012  . DEXA SCAN  01/20/2013  . PNA vac Low Risk Adult (1 of 2 - PCV13) 01/20/2013  . INFLUENZA VACCINE  07/11/2017 (Originally 01/04/2016)  . HEMOGLOBIN A1C  01/08/2017  . URINE MICROALBUMIN  07/11/2017  . TETANUS/TDAP  06/06/2019  . COLONOSCOPY  07/22/2019  . Hepatitis C Screening  Completed     Exam:  BP 134/64   Pulse 76   Wt 177 lb (80.3 kg)   BMI 26.14 kg/m   Gen: Well NAD Right shoulder normal appearing normal motion tenderness.    No results found for this or any previous visit (from the past 72 hour(s)). No results found.    Assessment and Plan: 71 y.o. female with shoulder pain likely resolve subacromial bursitis. Continue home exercise program return as needed.  Patient was given Prevnar pneumonia and 13 vaccine today prior to discharge   Orders Placed This Encounter  Procedures  . Pneumococcal conjugate vaccine 13-valent   No orders of the defined types were placed in this  encounter.    Discussed warning signs or symptoms. Please see discharge instructions. Patient expresses understanding.

## 2016-08-22 NOTE — Patient Instructions (Addendum)
Thank you for coming in today. Continue home exercises.  Return as needed.   Call or go to the emergency room if you get worse, have trouble breathing, have chest pains, or palpitations.

## 2016-09-05 ENCOUNTER — Encounter: Payer: Self-pay | Admitting: Osteopathic Medicine

## 2016-09-05 ENCOUNTER — Ambulatory Visit (INDEPENDENT_AMBULATORY_CARE_PROVIDER_SITE_OTHER): Payer: PPO | Admitting: Osteopathic Medicine

## 2016-09-05 VITALS — BP 113/91 | HR 82 | Ht 69.0 in | Wt 176.0 lb

## 2016-09-05 DIAGNOSIS — I5189 Other ill-defined heart diseases: Secondary | ICD-10-CM

## 2016-09-05 DIAGNOSIS — I519 Heart disease, unspecified: Secondary | ICD-10-CM

## 2016-09-05 DIAGNOSIS — R002 Palpitations: Secondary | ICD-10-CM | POA: Diagnosis not present

## 2016-09-05 DIAGNOSIS — E119 Type 2 diabetes mellitus without complications: Secondary | ICD-10-CM

## 2016-09-05 DIAGNOSIS — E785 Hyperlipidemia, unspecified: Secondary | ICD-10-CM

## 2016-09-05 NOTE — Progress Notes (Signed)
HPI: Kelli [name] is a 71 y.o. female  who presents to [facility] today, 09/06/16,  for chief complaint of:  Chief Complaint  Patient presents with  . Follow-up    LABS    Palpitations - here to follow-up on Echo, Holter studies. Palpitations haven't really changed in character . Quality: Rapid heartbeat, heart pounding . Severity: Mild . Timing: nighttime, intermittent, sometimes once a week sometimes every few days . Modifying factors: Nothing seems to cause it or make it worse/better. . Assoc signs/symptoms: No chest pain or shortness of breath, no dizziness    Echo: diastolic dysfunction but otherwise normal Holter: NSR during feelings of flutter, few extopic beats but no sustained arrhythmia   Past medical history, surgical history, social history and family history reviewed.  Patient Active Problem List   Diagnosis Date Noted  . Echocardiogram shows left ventricular diastolic dysfunction 70/62/3762  . Heart palpitations 07/25/2016  . Need for vaccination for pneumococcus 07/13/2016  . Osteoarthritis of hand, right 08/06/2015  . Diabetes mellitus type 2, uncomplicated ([place]) 83/15/1761  . Lung cancer, lower lobe ([place]) 10/09/2014  . Hematochezia 04/01/2013  . H/O colon cancer, stage II 09/19/2011  . Depression with anxiety 09/19/2011  . Hyperlipidemia 09/19/2011    Current medication list and allergy/intolerance information reviewed.   Current Outpatient Prescriptions on File Prior to Visit  Medication Sig Dispense Refill  . aspirin 81 MG tablet Take 81 mg by mouth daily.    [name] atorvastatin (LIPITOR) 40 MG tablet Take 1 tablet (40 mg total) by mouth daily. 90 tablet 1  . b complex vitamins tablet Take 1 tablet by mouth daily.    . Cholecalciferol (VITAMIN D) 2000 UNITS tablet Take 2,000 Units by mouth daily.    [name] FLUoxetine (PROZAC) 20 MG capsule Take 2 capsules (40 mg total) by mouth daily. 180 capsule 3  . meloxicam (MOBIC) 7.5 MG  tablet TAKE 1 TABLET BY MOUTH DAILY 90 tablet 0   No current facility-administered medications on file prior to visit.    Allergies  Allergen Reactions  . Oxycodone Other (See Comments)    Does not work- Hydrocodone does work      Review of Systems:  Constitutional: No recent illness  Cardiac: No  chest pain, No  pressure, +palpitations  Respiratory:  No  shortness of breath. No  Cough  Neurologic: No  weakness, No  Dizziness   Exam:  BP (!) 113/91   Pulse 82   Ht '5\' 9"'$  (1.753 m)   Wt 176 lb (79.8 kg)   BMI 25.99 kg/m   Constitutional: VS see above. General Appearance: alert, well-developed, well-nourished, NAD  Eyes: Normal lids and conjunctive, non-icteric sclera  Ears, Nose, Mouth, Throat: MMM, Normal external inspection ears/nares/mouth/lips/gums.  Neck: No masses, trachea midline.   Respiratory: Normal respiratory effort. no wheeze, no rhonchi, no rales  Cardiovascular: S1/S2 normal, no murmur, no rub/gallop auscultated. RRR.   Musculoskeletal: Gait normal. Symmetric and independent movement of all extremities  Neurological: Normal balance/coordination. No tremor.  Skin: warm, dry, intact.   Psychiatric: Normal judgment/insight. Normal mood and affect. Oriented x3.    ASSESSMENT/PLAN:   Heart palpitations - discussed no concerning findings on prelim workup, offer cardio referral to consider other workup or long-term rhythm monitor, pt declines at this time, stating the flutters are not too bothersome now. ER precautions reviewed.    Labs ordered for future visit:  Hyperlipidemia, unspecified hyperlipidemia type - Plan: COMPLETE METABOLIC PANEL WITH GFR,  Hemoglobin A1c, Lipid panel  Type 2 diabetes mellitus without complication, without long-term current use of insulin ([place]) - Plan: COMPLETE METABOLIC PANEL WITH GFR, Hemoglobin A1c, Lipid panel    Follow-up plan: Return in about 8 weeks (around 10/31/2016) for recheck labs - sooner if needed  .  Visit summary with medication list and pertinent instructions was printed for patient to review, alert [place] if any changes needed. All questions at time of visit were answered - patient instructed to contact office with any additional concerns. ER/RTC precautions were reviewed with the patient and understanding verbalized.

## 2016-09-15 ENCOUNTER — Other Ambulatory Visit: Payer: Self-pay

## 2016-09-15 ENCOUNTER — Telehealth: Payer: Self-pay

## 2016-09-15 NOTE — Telephone Encounter (Signed)
Kelli [name] called and reports since the start of Atorvastatin 40 mg daily she has had cramps in her legs and feet. She believes it is due to switching to Atorvastatin from Pravastatin. Please advise.

## 2016-09-15 NOTE — Telephone Encounter (Signed)
Pt advised of recommendations. Pt has enough refills.

## 2016-09-15 NOTE — Telephone Encounter (Signed)
Can switch back to pravastatin - atorvastatin is a stronger medication, but pravastatin is okay if she was able to take that medicine without any problems. Does she need refills?

## 2016-10-31 ENCOUNTER — Encounter: Payer: Self-pay | Admitting: Osteopathic Medicine

## 2016-10-31 ENCOUNTER — Ambulatory Visit (INDEPENDENT_AMBULATORY_CARE_PROVIDER_SITE_OTHER): Payer: PPO | Admitting: Osteopathic Medicine

## 2016-10-31 VITALS — BP 136/77 | HR 81 | Ht 69.0 in | Wt 177.0 lb

## 2016-10-31 DIAGNOSIS — R5383 Other fatigue: Secondary | ICD-10-CM

## 2016-10-31 DIAGNOSIS — E119 Type 2 diabetes mellitus without complications: Secondary | ICD-10-CM | POA: Diagnosis not present

## 2016-10-31 DIAGNOSIS — R002 Palpitations: Secondary | ICD-10-CM

## 2016-10-31 DIAGNOSIS — E785 Hyperlipidemia, unspecified: Secondary | ICD-10-CM | POA: Diagnosis not present

## 2016-10-31 LAB — CBC
HCT: 36.9 % (ref 35.0–45.0)
Hemoglobin: 12 g/dL (ref 11.7–15.5)
MCH: 30.9 pg (ref 27.0–33.0)
MCHC: 32.5 g/dL (ref 32.0–36.0)
MCV: 95.1 fL (ref 80.0–100.0)
MPV: 10.3 fL (ref 7.5–12.5)
PLATELETS: 298 10*3/uL (ref 140–400)
RBC: 3.88 MIL/uL (ref 3.80–5.10)
RDW: 13.5 % (ref 11.0–15.0)
WBC: 6.7 10*3/uL (ref 3.8–10.8)

## 2016-10-31 NOTE — Progress Notes (Signed)
HPI: Kelli [name] is a 71 y.o. female  who presents to [facility] today, 10/31/16,  for chief complaint of:  Chief Complaint  Patient presents with  . Follow-up    PALPITATIONS, Sugars, labs    Palpitations: Still feeling on occasion, no sustained symptoms or chest pain/shortness of breath. Patient has declined further workup with cardiologist after fairly unremarkable echocardiogram and Holter monitor. Holter did show some ectopic beats.  Diabetes: Patient is undecided about whether or not she wants to be on medications for this. We discussed metformin. She is eating fairly healthy, getting some exercise.  Hyperlipidemia: Due for recheck, has been on pravastatin, some compliant with low fat diet and exercise.  New concern: Has been expressing some fatigue out of the ordinary over the past few weeks. No polyuria/polydipsia. No dark/bloody stool or other unusual bleeding.    Past medical history, surgical history, social history and family history reviewed.  Patient Active Problem List   Diagnosis Date Noted  . Echocardiogram shows left ventricular diastolic dysfunction 29/56/2130  . Heart palpitations 07/25/2016  . Need for vaccination for pneumococcus 07/13/2016  . Osteoarthritis of hand, right 08/06/2015  . Diabetes mellitus type 2, uncomplicated ([place]) 86/57/8469  . Lung cancer, lower lobe ([place]) 10/09/2014  . Hematochezia 04/01/2013  . H/O colon cancer, stage II 09/19/2011  . Depression with anxiety 09/19/2011  . Hyperlipidemia 09/19/2011    Current medication list and allergy/intolerance information reviewed.   Current Outpatient Prescriptions on File Prior to Visit  Medication Sig Dispense Refill  . aspirin 81 MG tablet Take 81 mg by mouth daily.    [name] b complex vitamins tablet Take 1 tablet by mouth daily.    . Cholecalciferol (VITAMIN D) 2000 UNITS tablet Take 2,000 Units by mouth daily.    [name] FLUoxetine (PROZAC) 20 MG capsule Take 2  capsules (40 mg total) by mouth daily. 180 capsule 3  . meloxicam (MOBIC) 7.5 MG tablet TAKE 1 TABLET BY MOUTH DAILY 90 tablet 0  . pravastatin (PRAVACHOL) 40 MG tablet Take 40 mg by mouth daily.  3   No current facility-administered medications on file prior to visit.    Allergies  Allergen Reactions  . Atorvastatin Other (See Comments)    Muscle cramps  . Oxycodone Other (See Comments)    Does not work- Hydrocodone does work      Review of Systems:  Constitutional: No recent illness, + fatigue as per history of present illness  HEENT: No  headache, no vision change  Cardiac: No  chest pain, No  pressure, No palpitations  Respiratory:  No  shortness of breath. No  Cough  Gastrointestinal: No  abdominal pain, no change on bowel habits  Musculoskeletal: No new myalgia/arthralgia  Skin: No  Rash  Hem/Onc: No  easy bruising/bleeding, No  abnormal lumps/bumps  Neurologic: No  weakness, No  Dizziness  Psychiatric: No  concerns with depression, No  concerns with anxiety  Exam:  BP 136/77   Pulse 81   Ht 5\' 9"  (1.753 m)   Wt 177 lb (80.3 kg)   BMI 26.14 kg/m   Constitutional: VS see above. General Appearance: alert, well-developed, well-nourished, NAD  Eyes: Normal lids and conjunctive, non-icteric sclera  Ears, Nose, Mouth, Throat: MMM, Normal external inspection ears/nares/mouth/lips/gums.  Neck: No masses, trachea midline.   Respiratory: Normal respiratory effort. no wheeze, no rhonchi, no rales  Cardiovascular: S1/S2 normal, no murmur, no rub/gallop auscultated. RRR.   Musculoskeletal: Gait normal. Symmetric and  independent movement of all extremities  Neurological: Normal balance/coordination. No tremor.  Skin: warm, dry, intact.   Psychiatric: Normal judgment/insight. Normal mood and affect. Oriented x3.    Recent Results (from the past 2160 hour(s))  COMPLETE METABOLIC PANEL WITH GFR     Status: Abnormal   Collection Time: 10/31/16  3:26 PM  Result  Value Ref Range   Sodium 141 135 - 146 mmol/L   Potassium 4.1 3.5 - 5.3 mmol/L   Chloride 104 98 - 110 mmol/L   CO2 28 20 - 31 mmol/L   Glucose, Bld 196 (H) 65 - 99 mg/dL   BUN 13 7 - 25 mg/dL   Creat 0.81 0.50 - 0.99 mg/dL    Comment:   For patients > or = 69 years of age: The upper reference limit for Creatinine is approximately 13% higher for people identified as African-American.      Total Bilirubin 0.3 0.2 - 1.2 mg/dL   Alkaline Phosphatase 90 33 - 130 U/L   AST 15 10 - 35 U/L   ALT 17 6 - 29 U/L   Total Protein 6.4 6.1 - 8.1 g/dL   Albumin 4.0 3.6 - 5.1 g/dL   Calcium 9.0 8.6 - 10.4 mg/dL   GFR, Est African American 86 >=60 mL/min   GFR, Est Non African American 75 >=60 mL/min  Hemoglobin A1c     Status: Abnormal   Collection Time: 10/31/16  3:26 PM  Result Value Ref Range   Hgb A1c MFr Bld 7.2 (H) <5.7 %    Comment:   For someone without known diabetes, a hemoglobin A1c value of 6.5% or greater indicates that they may have diabetes and this should be confirmed with a follow-up test.   For someone with known diabetes, a value <7% indicates that their diabetes is well controlled and a value greater than or equal to 7% indicates suboptimal control. A1c targets should be individualized based on duration of diabetes, age, comorbid conditions, and other considerations.   Currently, no consensus exists for use of hemoglobin A1c for diagnosis of diabetes for children.      Mean Plasma Glucose 160 mg/dL  Lipid panel     Status: Abnormal   Collection Time: 10/31/16  3:26 PM  Result Value Ref Range   Cholesterol 231 (H) <200 mg/dL   Triglycerides 326 (H) <150 mg/dL   HDL 39 (L) >50 mg/dL   Total CHOL/HDL Ratio 5.9 (H) <5.0 Ratio   VLDL 65 (H) <30 mg/dL   LDL Cholesterol 127 (H) <100 mg/dL  CBC     Status: None   Collection Time: 10/31/16  3:28 PM  Result Value Ref Range   WBC 6.7 3.8 - 10.8 K/uL   RBC 3.88 3.80 - 5.10 MIL/uL   Hemoglobin 12.0 11.7 - 15.5 g/dL    HCT 36.9 35.0 - 45.0 %   MCV 95.1 80.0 - 100.0 fL   MCH 30.9 27.0 - 33.0 pg   MCHC 32.5 32.0 - 36.0 g/dL   RDW 13.5 11.0 - 15.0 %   Platelets 298 140 - 400 K/uL   MPV 10.3 7.5 - 12.5 fL  TSH     Status: None   Collection Time: 10/31/16  3:28 PM  Result Value Ref Range   TSH 1.48 mIU/L    Comment:   Reference Range   > or = 20 Years  0.40-4.50   Pregnancy Range First trimester  0.26-2.66 Second trimester 0.55-2.73 Third trimester  0.43-2.91  No results found.  No flowsheet data found.  Depression screen [facility] 2/9 09/05/2016 03/09/2016 04/12/2015  Decreased Interest 1 0 0  Down, Depressed, Hopeless 0 0 0  PHQ - 2 Score 1 0 0      ASSESSMENT/PLAN:   Type 2 diabetes mellitus without complication, without long-term current use of insulin (HCC) - A1c stable, see lab results notes, would advise metformin per guidelines  Other fatigue - No concerns on labs, advise optimize diet/exercise, recheck as needed - Plan: CBC, TSH  Heart palpitations - Not particularly bothersome to patient, patient declines cardiology referral, precautions reviewed  Hyperlipidemia, unspecified hyperlipidemia type - See lab results, will discuss higher intensity statin medication      Follow-up plan: Return in about 4 months (around 03/03/2017) for sugar recheck, sooner if needed .  Visit summary with medication list and pertinent instructions was printed for patient to review, alert [place] if any changes needed. All questions at time of visit were answered - patient instructed to contact office with any additional concerns. ER/RTC precautions were reviewed with the patient and understanding verbalized.

## 2016-11-01 LAB — LIPID PANEL
CHOLESTEROL: 231 mg/dL — AB (ref <200)
HDL: 39 mg/dL — ABNORMAL LOW (ref >50)
LDL Cholesterol: 127 mg/dL — ABNORMAL HIGH (ref <100)
TRIGLYCERIDES: 326 mg/dL — AB (ref <150)
Total CHOL/HDL Ratio: 5.9 Ratio — ABNORMAL HIGH (ref <5.0)
VLDL: 65 mg/dL — AB (ref <30)

## 2016-11-01 LAB — COMPLETE METABOLIC PANEL WITH GFR
ALK PHOS: 90 U/L (ref 33–130)
ALT: 17 U/L (ref 6–29)
AST: 15 U/L (ref 10–35)
Albumin: 4 g/dL (ref 3.6–5.1)
BUN: 13 mg/dL (ref 7–25)
CHLORIDE: 104 mmol/L (ref 98–110)
CO2: 28 mmol/L (ref 20–31)
Calcium: 9 mg/dL (ref 8.6–10.4)
Creat: 0.81 mg/dL (ref 0.50–0.99)
GFR, EST NON AFRICAN AMERICAN: 75 mL/min (ref >=60)
GFR, Est African American: 86 mL/min (ref >=60)
GLUCOSE: 196 mg/dL — AB (ref 65–99)
Potassium: 4.1 mmol/L (ref 3.5–5.3)
SODIUM: 141 mmol/L (ref 135–146)
Total Bilirubin: 0.3 mg/dL (ref 0.2–1.2)
Total Protein: 6.4 g/dL (ref 6.1–8.1)

## 2016-11-01 LAB — HEMOGLOBIN A1C
Hgb A1c MFr Bld: 7.2 % — ABNORMAL HIGH (ref <5.7)
Mean Plasma Glucose: 160 mg/dL

## 2016-11-01 LAB — TSH: TSH: 1.48 mIU/L

## 2016-11-03 ENCOUNTER — Other Ambulatory Visit: Payer: Self-pay | Admitting: Osteopathic Medicine

## 2016-11-03 ENCOUNTER — Telehealth: Payer: Self-pay

## 2016-11-03 MED ORDER — METFORMIN HCL ER 500 MG PO TB24: 500.0000 mg | ORAL_TABLET | Freq: Every day | ORAL | 0 refills | Status: DC

## 2016-11-03 MED ORDER — PRAVASTATIN SODIUM 80 MG PO TABS: 80.0000 mg | ORAL_TABLET | Freq: Every day | ORAL | 1 refills | Status: DC

## 2016-11-03 NOTE — Telephone Encounter (Signed)
Spoke to patient gave her lab results. Patient stated that she is willing toi take the metformin but it was not added already. Please add Metformin, dose and instructions so patient can be called back to know that it has been sent to pharmacy. Lorie Cleckley,CMA

## 2016-11-03 NOTE — Telephone Encounter (Signed)
Patient has been informed. Rhonda Cunningham,CMA  

## 2016-11-03 NOTE — Telephone Encounter (Signed)
Per provider, Metformin 500 mg #90 0 refills was sent to  [facility]. Rhonda Cunningham,CMA

## 2016-11-03 NOTE — Telephone Encounter (Signed)
Can order Metformin ER 500 mg 1 P.O. daily #90 refills x0 I can't place orders from mobile app. Thanks!

## 2016-11-03 NOTE — Progress Notes (Signed)
Change pravastatin

## 2016-11-06 ENCOUNTER — Telehealth: Payer: Self-pay

## 2016-11-06 NOTE — Telephone Encounter (Signed)
Pt called and wanted to verify her medication dose. Medication dose was verified with Dr. Sheppard Coil that pt is to take metformin ER 500 mg 1 tablet daily with breakfast as prescribed. Pt wanted to discuss her recent labs with Dr. Sheppard Coil. Pt advised to schedule an appt. Pt stated she already had an appt scheduled and that she would discuss with her then.

## 2017-02-20 ENCOUNTER — Telehealth: Payer: Self-pay | Admitting: Osteopathic Medicine

## 2017-02-20 ENCOUNTER — Other Ambulatory Visit: Payer: Self-pay | Admitting: Osteopathic Medicine

## 2017-02-20 NOTE — Telephone Encounter (Signed)
PT WANTS TO KNOW IF SHE NEEDS TO GET BLOOD WORK DONE FOR HER DIABETES MEDS, IF SO CAN SHE WOULD LIKE A LAB ORDER CREATED TO DO SO, OR IF SHE JUST NEEDS TO CONTINUE TO TAKE THE MEDS FOR A LITTLE LONGER, AND IF THIS IS THE CHOICE SHE WOULD NEED A REFILL CALLED IN TO THE PHARMACY

## 2017-02-21 NOTE — Telephone Encounter (Signed)
See message from Lanae Boast from yesterday regarding labs and metformin.

## 2017-02-21 NOTE — Telephone Encounter (Signed)
Patient was instructed at last visit to follow up around 4 months from then (now-ish - see last progress note) so we can monitor her diabetes and we can check her sugars with a fingerstick A1C in the office - can let her know this is routine for all diabetes patients, that way if the sugars are looking worse we can discuss any necessary changes then and there. Can refill med but she should schedule appointment.

## 2017-02-22 NOTE — Telephone Encounter (Signed)
Notified patient. Transferred to scheduling, and refill for 30 days sent.

## 2017-02-26 ENCOUNTER — Encounter: Payer: Self-pay | Admitting: Osteopathic Medicine

## 2017-02-26 ENCOUNTER — Ambulatory Visit (INDEPENDENT_AMBULATORY_CARE_PROVIDER_SITE_OTHER): Payer: PPO | Admitting: Osteopathic Medicine

## 2017-02-26 VITALS — BP 119/76 | HR 72 | Wt 177.0 lb

## 2017-02-26 DIAGNOSIS — E785 Hyperlipidemia, unspecified: Secondary | ICD-10-CM

## 2017-02-26 DIAGNOSIS — L989 Disorder of the skin and subcutaneous tissue, unspecified: Secondary | ICD-10-CM

## 2017-02-26 DIAGNOSIS — R5383 Other fatigue: Secondary | ICD-10-CM

## 2017-02-26 DIAGNOSIS — E1169 Type 2 diabetes mellitus with other specified complication: Secondary | ICD-10-CM

## 2017-02-26 DIAGNOSIS — E119 Type 2 diabetes mellitus without complications: Secondary | ICD-10-CM | POA: Diagnosis not present

## 2017-02-26 DIAGNOSIS — G4710 Hypersomnia, unspecified: Secondary | ICD-10-CM | POA: Diagnosis not present

## 2017-02-26 LAB — POCT GLYCOSYLATED HEMOGLOBIN (HGB A1C): HEMOGLOBIN A1C: 8.1

## 2017-02-26 NOTE — Progress Notes (Signed)
HPI: Kelli [name] is a 71 y.o. female  who presents to [place] today, 02/26/17,  for chief complaint of:  Chief Complaint  Patient presents with  . Follow-up    DIABETES    Diabetes: Patient Initially was undecided about whether or not she wants to be on medications for this. We discussed metformin. She is eating fairly healthy, getting some exercise. Last visit 10/31/16: 7.2%, metformin advised at that point and started 500 mg daily. Today A1C 8.1%.   Sleep disturbance: has been on Prozac for years, hypersomnia sleeping up to 12-14 hours per day is fairly new over the past few months. No fever/night sweats, no unintentional weight changes. She does not set an alarm or go to bed at any specific time every night.  Skin concern: Lump on left leg has been bothering her for the past few months.    Past medical history, surgical history, social history and family history reviewed.  Patient Active Problem List   Diagnosis Date Noted  . Echocardiogram shows left ventricular diastolic dysfunction 56/21/3086  . Heart palpitations 07/25/2016  . Need for vaccination for pneumococcus 07/13/2016  . Osteoarthritis of hand, right 08/06/2015  . Diabetes mellitus type 2, uncomplicated ([place]) 57/84/6962  . Lung cancer, lower lobe ([place]) 10/09/2014  . Hematochezia 04/01/2013  . H/O colon cancer, stage II 09/19/2011  . Depression with anxiety 09/19/2011  . Hyperlipidemia 09/19/2011    Current medication list and allergy/intolerance information reviewed.   Current Outpatient Prescriptions on File Prior to Visit  Medication Sig Dispense Refill  . aspirin 81 MG tablet Take 81 mg by mouth daily.    [name] b complex vitamins tablet Take 1 tablet by mouth daily.    . Cholecalciferol (VITAMIN D) 2000 UNITS tablet Take 2,000 Units by mouth daily.    [name] FLUoxetine (PROZAC) 20 MG capsule Take 2 capsules (40 mg total) by mouth daily. 180 capsule 3  . meloxicam (MOBIC) 7.5  MG tablet TAKE 1 TABLET BY MOUTH DAILY 90 tablet 0  . metFORMIN (GLUCOPHAGE-XR) 500 MG 24 hr tablet Take 1 tablet (500 mg total) by mouth daily with breakfast. Due for follow up visit 30 tablet 0  . pravastatin (PRAVACHOL) 80 MG tablet Take 1 tablet (80 mg total) by mouth daily. 90 tablet 1   No current facility-administered medications on file prior to visit.    Allergies  Allergen Reactions  . Atorvastatin Other (See Comments)    Muscle cramps  . Oxycodone Other (See Comments)    Does not work- Hydrocodone does work      Review of Systems:  Constitutional: No recent illness, + fatigue as per history of present illness  HEENT: No  headache, no vision change  Cardiac: No  chest pain, No  pressure, No palpitations  Respiratory:  No  shortness of breath. No  Cough  Gastrointestinal: No  abdominal pain, no change on bowel habits  Musculoskeletal: No new myalgia/arthralgia  Skin: +Rash  Hem/Onc: No  easy bruising/bleeding, No  abnormal lumps/bumps  Neurologic: No  weakness, No  Dizziness  Psychiatric: No  concerns with depression, No  concerns with anxiety, + sleep disturbance as per history of present illness  Exam:  BP 119/76   Pulse 72   Wt 177 lb (80.3 kg)   BMI 26.14 kg/m   Constitutional: VS see above. General Appearance: alert, well-developed, well-nourished, NAD  Eyes: Normal lids and conjunctive, non-icteric sclera  Ears, Nose, Mouth, Throat: MMM, Normal external inspection  ears/nares/mouth/lips/gums.  Neck: No masses, trachea midline.   Respiratory: Normal respiratory effort. no wheeze, no rhonchi, no rales  Cardiovascular: S1/S2 normal, no murmur, no rub/gallop auscultated. RRR.   Musculoskeletal: Gait normal. Symmetric and independent movement of all extremities  Neurological: Normal balance/coordination. No tremor.  Skin: warm, dry, intact. Small pea-sized nodular growth on the left lower extremity, consistent with warty growth versus basal  cell  Psychiatric: Normal judgment/insight. Normal mood and affect. Oriented x3.    Recent Results (from the past 2160 hour(s))  POCT HgB A1C     Status: None   Collection Time: 02/26/17  3:19 PM  Result Value Ref Range   Hemoglobin A1C 8.1     Depression screen [facility] 2/9 02/26/2017 09/05/2016 03/09/2016  Decreased Interest 1 1 0  Down, Depressed, Hopeless 0 0 0  PHQ - 2 Score 1 1 0  Altered sleeping 3 - -  Tired, decreased energy 2 - -  Change in appetite 0 - -  Feeling bad or failure about yourself  0 - -  Trouble concentrating 0 - -  Moving slowly or fidgety/restless 0 - -  Suicidal thoughts 0 - -  PHQ-9 Score 6 - -      ASSESSMENT/PLAN:   Diabetes mellitus without complication (HCC) - Poor A1c control on minimal dose of metoprolol, we will escalate slowly to maximum dose. - Plan: POCT HgB A1C  Hyperlipidemia associated with type 2 diabetes mellitus ([place]) - We'll be due to recheck next few months  Hypersomnia - Advice at schedule of sleep/wake times, recent labs showed no concerns. Optimize diet/exercise, consider further workup if no improvement  Other fatigue  Skin lesion - We'll have her back for excision   Patient Instructions  Plan:  Week 1: Increase the Metformin 500 mg twice per day  Week 2: Metformin 1000 mg in the morning, 500 mg in the evening   Week 3: Metformin 1000 mg tice per day  As long as no problematic GI side effects, continue with  If any problems with gong up on Metformin let me know      Follow-up plan: Return in about 3 months (around 05/28/2017) for recheck diabetes, sooner if needed .  Visit summary with medication list and pertinent instructions was printed for patient to review, alert [place] if any changes needed. All questions at time of visit were answered - patient instructed to contact office with any additional concerns. ER/RTC precautions were reviewed with the patient and understanding verbalized.

## 2017-02-26 NOTE — Patient Instructions (Addendum)
Plan:  Week 1: Increase the Metformin 500 mg twice per day  Week 2: Metformin 1000 mg in the morning, 500 mg in the evening   Week 3: Metformin 1000 mg tice per day  As long as no problematic GI side effects, continue with  If any problems with gong up on Metformin let me know

## 2017-02-28 ENCOUNTER — Ambulatory Visit (INDEPENDENT_AMBULATORY_CARE_PROVIDER_SITE_OTHER): Payer: PPO | Admitting: Osteopathic Medicine

## 2017-02-28 ENCOUNTER — Encounter: Payer: Self-pay | Admitting: Osteopathic Medicine

## 2017-02-28 VITALS — BP 128/70 | HR 76 | Wt 177.0 lb

## 2017-02-28 DIAGNOSIS — L989 Disorder of the skin and subcutaneous tissue, unspecified: Secondary | ICD-10-CM

## 2017-02-28 DIAGNOSIS — C44729 Squamous cell carcinoma of skin of left lower limb, including hip: Secondary | ICD-10-CM | POA: Diagnosis not present

## 2017-02-28 MED ORDER — METFORMIN HCL ER 500 MG PO TB24: 500.0000 mg | ORAL_TABLET | Freq: Every day | ORAL | 1 refills | Status: DC

## 2017-02-28 MED ORDER — CEPHALEXIN 500 MG PO CAPS: 500.0000 mg | ORAL_CAPSULE | Freq: Two times a day (BID) | ORAL | 0 refills | Status: DC

## 2017-02-28 NOTE — Progress Notes (Signed)
HPI: Kelli [name] is a 71 y.o. female  who presents to [place] today, 02/28/17,  for chief complaint of:  Chief Complaint  Patient presents with  . Biopsy    LEFT LEG    Here today for biopsy of skin lesion c/w basal cell carcinoma versus abnormal warty growth  Past medical history, surgical history, social history and family history reviewed.  Patient Active Problem List   Diagnosis Date Noted  . Echocardiogram shows left ventricular diastolic dysfunction 57/84/6962  . Heart palpitations 07/25/2016  . Need for vaccination for pneumococcus 07/13/2016  . Osteoarthritis of hand, right 08/06/2015  . Diabetes mellitus type 2, uncomplicated ([place]) 95/28/4132  . Lung cancer, lower lobe ([place]) 10/09/2014  . Hematochezia 04/01/2013  . H/O colon cancer, stage II 09/19/2011  . Depression with anxiety 09/19/2011  . Hyperlipidemia 09/19/2011    Current medication list and allergy/intolerance information reviewed.   Current Outpatient Prescriptions on File Prior to Visit  Medication Sig Dispense Refill  . aspirin 81 MG tablet Take 81 mg by mouth daily.    [name] b complex vitamins tablet Take 1 tablet by mouth daily.    . Cholecalciferol (VITAMIN D) 2000 UNITS tablet Take 2,000 Units by mouth daily.    [name] FLUoxetine (PROZAC) 20 MG capsule Take 2 capsules (40 mg total) by mouth daily. 180 capsule 3  . metFORMIN (GLUCOPHAGE-XR) 500 MG 24 hr tablet Take 1 tablet (500 mg total) by mouth daily with breakfast. Due for follow up visit 30 tablet 0  . pravastatin (PRAVACHOL) 80 MG tablet Take 1 tablet (80 mg total) by mouth daily. 90 tablet 1   No current facility-administered medications on file prior to visit.    Allergies  Allergen Reactions  . Atorvastatin Other (See Comments)    Muscle cramps  . Oxycodone Other (See Comments)    Does not work- Hydrocodone does work      Review of Systems:  Constitutional: No recent illness, feels well today   Skin:  +skin lesion   Exam:  BP 128/70   Pulse 76   Wt 177 lb (80.3 kg)   BMI 26.14 kg/m   Constitutional: VS see above. General Appearance: alert, well-developed, well-nourished, NAD  Neurological: Normal balance/coordination. No tremor.  Skin: warm, dry, intact. Small pea-sized nodular growth on the left lower extremity, consistent with warty growth versus basal cell  Psychiatric: Normal judgment/insight. Normal mood and affect. Oriented x3.      PRE-OP DIAGNOSIS: Abnormal Skin Lesion - consistent with basal cell carcinoma POST-OP DIAGNOSIS: Same  PROCEDURE: Excisional skin biopsy Performing Physician: Emeterio Reeve    The area surrounding the skin lesion was prepared and draped in the usual sterile manner. The lesion was removed in the usual manner by the biopsy method noted above, allowing for 5 mm margins on all sides of the lesion.. Hemostasis was assured. 3 simple interrupted subcutaneous layers were used to approximate the fascia. Running horizontal mattress suture and DermaBond was used to close dermis & epidermis. The patient tolerated the procedure well.   Followup: The patient tolerated the procedure well without complications.  Standard post-procedure care is explained and return precautions are given.    ASSESSMENT/PLAN:   Leg lesion - Plan: Dermatology pathology    Follow-up plan: Return in about 14 days (around 03/14/2017) for suture removal - sooner if needed .  Visit summary with medication list and pertinent instructions was printed for patient to review, alert [place] if any changes needed.  All questions at time of visit were answered - patient instructed to contact office with any additional concerns. ER/RTC precautions were reviewed with the patient and understanding verbalized.

## 2017-03-05 ENCOUNTER — Other Ambulatory Visit: Payer: Self-pay | Admitting: Osteopathic Medicine

## 2017-03-05 DIAGNOSIS — IMO0002 Reserved for concepts with insufficient information to code with codable children: Secondary | ICD-10-CM | POA: Insufficient documentation

## 2017-03-05 MED ORDER — METFORMIN HCL ER 500 MG PO TB24: 500.0000 mg | ORAL_TABLET | Freq: Two times a day (BID) | ORAL | 1 refills | Status: DC

## 2017-03-05 NOTE — Progress Notes (Signed)
Doing well - wound nonpainful and intact. No signs of infection. Squamous cell w/ clear margins!

## 2017-03-15 ENCOUNTER — Ambulatory Visit: Payer: Medicare Other | Admitting: Osteopathic Medicine

## 2017-03-15 VITALS — BP 122/73 | HR 87

## 2017-03-15 DIAGNOSIS — E782 Mixed hyperlipidemia: Secondary | ICD-10-CM

## 2017-03-15 DIAGNOSIS — E119 Type 2 diabetes mellitus without complications: Secondary | ICD-10-CM

## 2017-03-15 DIAGNOSIS — IMO0002 Reserved for concepts with insufficient information to code with codable children: Secondary | ICD-10-CM

## 2017-03-16 NOTE — Progress Notes (Signed)
HPI: Kelli [name] is a 71 y.o. female  who presents to [place] today, 03/16/17,  for chief complaint of:  Chief Complaint  Patient presents with  . Suture / Staple Removal    Recent excisional biopsy squamous cell - clear margins.    Past medical history, surgical history, social history and family history reviewed.  Patient Active Problem List   Diagnosis Date Noted  . Squamous cell carcinoma 03/05/2017  . Echocardiogram shows left ventricular diastolic dysfunction 31/42/7670  . Heart palpitations 07/25/2016  . Need for vaccination for pneumococcus 07/13/2016  . Osteoarthritis of hand, right 08/06/2015  . Diabetes mellitus type 2, uncomplicated ([place]) 11/00/3496  . Lung cancer, lower lobe ([place]) 10/09/2014  . Hematochezia 04/01/2013  . H/O colon cancer, stage II 09/19/2011  . Depression with anxiety 09/19/2011  . Hyperlipidemia 09/19/2011    Current medication list and allergy/intolerance information reviewed.   Current Outpatient Prescriptions on File Prior to Visit  Medication Sig Dispense Refill  . aspirin 81 MG tablet Take 81 mg by mouth daily.    [name] b complex vitamins tablet Take 1 tablet by mouth daily.    . Cholecalciferol (VITAMIN D) 2000 UNITS tablet Take 2,000 Units by mouth daily.    [name] FLUoxetine (PROZAC) 20 MG capsule Take 2 capsules (40 mg total) by mouth daily. 180 capsule 3  . metFORMIN (GLUCOPHAGE-XR) 500 MG 24 hr tablet Take 1 tablet (500 mg total) by mouth 2 (two) times daily. 180 tablet 1  . pravastatin (PRAVACHOL) 80 MG tablet Take 1 tablet (80 mg total) by mouth daily. 90 tablet 1   No current facility-administered medications on file prior to visit.    Allergies  Allergen Reactions  . Atorvastatin Other (See Comments)    Muscle cramps  . Oxycodone Other (See Comments)    Does not work- Hydrocodone does work      Review of Systems:  Constitutional: No recent illness, no fever/chills  Musculoskeletal: No  new myalgia/arthralgia  Skin: Healing well, nonpainful   Hem/Onc: No  easy bruising/bleeding  Exam:  BP 122/73   Pulse 87   Constitutional: VS see above. General Appearance: alert, well-developed, well-nourished, NAD  Musculoskeletal: Gait normal.   Skin: warm, dry.   Psychiatric: Normal judgment/insight. Normal mood and affect. Oriented x3.     Suture removal: Cleaned skin with alcohol Suture cut and gently removed - was continuous horizontal mattress suture, 2 pieces removed without residual.  Patient tolerated procedure well No erythema/drainage Slight incomplete healing centrally, patient advised on wound care   ASSESSMENT/PLAN:   Requests labs for add-on when drawn at [place] f/u  Squamous cell carcinoma  Mixed hyperlipidemia - Plan: Lipid panel, COMPLETE METABOLIC PANEL WITH GFR, LTE43+DTPN, Lipid panel  Diabetes mellitus without complication ([place])      Follow-up plan: Return for 05/2017 diabetes sugar recheck .  Visit summary with medication list and pertinent instructions was printed for patient to review, alert [place] if any changes needed. All questions at time of visit were answered - patient instructed to contact office with any additional concerns. ER/RTC precautions were reviewed with the patient and understanding verbalized.

## 2017-03-19 ENCOUNTER — Other Ambulatory Visit: Payer: Self-pay | Admitting: *Deleted

## 2017-03-19 DIAGNOSIS — IMO0002 Reserved for concepts with insufficient information to code with codable children: Secondary | ICD-10-CM

## 2017-03-20 ENCOUNTER — Other Ambulatory Visit (HOSPITAL_BASED_OUTPATIENT_CLINIC_OR_DEPARTMENT_OTHER): Payer: Medicare Other

## 2017-03-20 ENCOUNTER — Ambulatory Visit (HOSPITAL_COMMUNITY)
Admission: RE | Admit: 2017-03-20 | Discharge: 2017-03-20 | Disposition: A | Discharge status: Home / Self Care | Payer: Medicare Other | Source: Ambulatory Visit | Attending: Oncology | Admitting: Oncology

## 2017-03-20 ENCOUNTER — Encounter (HOSPITAL_COMMUNITY): Payer: Self-pay

## 2017-03-20 DIAGNOSIS — I2510 Atherosclerotic heart disease of native coronary artery without angina pectoris: Secondary | ICD-10-CM | POA: Diagnosis not present

## 2017-03-20 DIAGNOSIS — C3431 Malignant neoplasm of lower lobe, right bronchus or lung: Secondary | ICD-10-CM | POA: Diagnosis not present

## 2017-03-20 DIAGNOSIS — C3491 Malignant neoplasm of unspecified part of right bronchus or lung: Secondary | ICD-10-CM | POA: Diagnosis not present

## 2017-03-20 DIAGNOSIS — I700 Atherosclerosis of aorta: Secondary | ICD-10-CM | POA: Diagnosis not present

## 2017-03-20 DIAGNOSIS — M5134 Other intervertebral disc degeneration, thoracic region: Secondary | ICD-10-CM | POA: Diagnosis not present

## 2017-03-20 DIAGNOSIS — C4492 Squamous cell carcinoma of skin, unspecified: Secondary | ICD-10-CM

## 2017-03-20 DIAGNOSIS — Z9882 Breast implant status: Secondary | ICD-10-CM | POA: Insufficient documentation

## 2017-03-20 DIAGNOSIS — IMO0002 Reserved for concepts with insufficient information to code with codable children: Secondary | ICD-10-CM

## 2017-03-20 LAB — BASIC METABOLIC PANEL
Anion Gap: 8 mEq/L (ref 3–11)
BUN: 10.5 mg/dL (ref 7.0–26.0)
CALCIUM: 9 mg/dL (ref 8.4–10.4)
CHLORIDE: 106 meq/L (ref 98–109)
CO2: 25 mEq/L (ref 22–29)
CREATININE: 0.9 mg/dL (ref 0.6–1.1)
EGFR: >60 mL/min/{1.73_m2} (ref >60)
Glucose: 229 mg/dl — ABNORMAL HIGH (ref 70–140)
Potassium: 4.2 mEq/L (ref 3.5–5.1)
Sodium: 139 mEq/L (ref 136–145)

## 2017-03-22 ENCOUNTER — Ambulatory Visit (HOSPITAL_BASED_OUTPATIENT_CLINIC_OR_DEPARTMENT_OTHER): Payer: Medicare Other | Admitting: Oncology

## 2017-03-22 ENCOUNTER — Telehealth: Payer: Self-pay | Admitting: Oncology

## 2017-03-22 VITALS — BP 122/66 | HR 69 | Temp 97.6°F | Resp 17 | Ht 69.0 in | Wt 176.4 lb

## 2017-03-22 DIAGNOSIS — Z85038 Personal history of other malignant neoplasm of large intestine: Secondary | ICD-10-CM | POA: Diagnosis not present

## 2017-03-22 DIAGNOSIS — C3431 Malignant neoplasm of lower lobe, right bronchus or lung: Secondary | ICD-10-CM

## 2017-03-22 DIAGNOSIS — Z85118 Personal history of other malignant neoplasm of bronchus and lung: Secondary | ICD-10-CM | POA: Diagnosis not present

## 2017-03-22 DIAGNOSIS — Z85828 Personal history of other malignant neoplasm of skin: Secondary | ICD-10-CM

## 2017-03-22 DIAGNOSIS — R918 Other nonspecific abnormal finding of lung field: Secondary | ICD-10-CM

## 2017-03-22 NOTE — Telephone Encounter (Signed)
Gave avs and calendar for October 2019

## 2017-03-22 NOTE — Progress Notes (Signed)
[facility] OFFICE PROGRESS NOTE   Diagnosis: Colon cancer, non-small cell lung cancer  INTERVAL HISTORY:   Ms. Geraci returns as scheduled. She feels well. No complaint. She recently had a basal cell carcinoma removed from a leg.  Objective:  Vital signs in last 24 hours:  Blood pressure 122/66, pulse 69, temperature 97.6 F (36.4 C), temperature source Oral, resp. rate 17, height 5' 9"  (1.753 m), weight 176 lb 6.4 oz (80 kg), SpO2 98 %.    HEENT: Neck without mass Lymphatics: No cervical, supraclavicular, axillary, or inguinal nodes Resp: Lungs clear bilaterally with decreased breath sounds at the right base, no respiratory distress Cardio: Regular rate and rhythm GI: No hepatosplenomegaly, no mass Vascular: No leg edema   Lab Results:  Lab Results  Component Value Date   WBC 6.7 10/31/2016   HGB 12.0 10/31/2016   HCT 36.9 10/31/2016   MCV 95.1 10/31/2016   PLT 298 10/31/2016   NEUTROABS 4,650 07/25/2016    CMP     Component Value Date/Time   NA 139 03/20/2017 1209   K 4.2 03/20/2017 1209   CL 104 10/31/2016 1526   CL 104 03/12/2012 0924   CO2 25 03/20/2017 1209   GLUCOSE 229 (H) 03/20/2017 1209   GLUCOSE 156 (H) 03/12/2012 0924   BUN 10.5 03/20/2017 1209   CREATININE 0.9 03/20/2017 1209   CALCIUM 9.0 03/20/2017 1209   PROT 6.4 10/31/2016 1526   PROT 6.6 08/28/2014 0942   ALBUMIN 4.0 10/31/2016 1526   ALBUMIN 3.8 08/28/2014 0942   AST 15 10/31/2016 1526   AST 19 08/28/2014 0942   ALT 17 10/31/2016 1526   ALT 25 08/28/2014 0942   ALKPHOS 90 10/31/2016 1526   ALKPHOS 92 08/28/2014 0942   BILITOT 0.3 10/31/2016 1526   BILITOT 0.27 08/28/2014 0942   GFRNONAA 75 10/31/2016 1526   GFRAA 86 10/31/2016 1526    No results found for: CEA1  Lab Results  Component Value Date   INR 0.96 10/01/2014    Imaging:  Ct Chest Wo Contrast  Result Date: 03/20/2017 CLINICAL DATA:  Malignant neoplasm of the right lung. EXAM: CT CHEST WITHOUT  CONTRAST TECHNIQUE: Multidetector CT imaging of the chest was performed following the standard protocol without IV contrast. COMPARISON:  06/19/2016 FINDINGS: Cardiovascular: Normal heart size. No pericardial effusion identified. Aortic atherosclerosis. Calcification within the RCA coronary artery noted. Mediastinum/Nodes: No enlarged mediastinal or axillary lymph nodes. Thyroid gland, trachea, and esophagus demonstrate no significant findings. Lungs/Pleura: Moderate changes of emphysema. Solid subpleural nodule overlying the anterior left upper lobe measures 8 mm and is unchanged from previous exam. Adjacent sub solid nodule measures 1.1 cm, image 72 of series 5. Also unchanged from previous exam. Solid subpleural nodule overlying the posterior left lower lobe measures 3 mm and is unchanged from previous exam, image 88 of series 5. 4 mm subpleural nodule in the left lung base is unchanged, image 117 of series 5. 5 mm right lower lobe lung nodule is unchanged, image 81 of series 5. Perifissural nodule within the anterior right lung measures 5 mm and is unchanged from previous exam, image number 84 of series 5. Upper Abdomen: Stable tiny cyst in the lateral segment of left lobe of liver measuring 5 mm, image 128 of series 2. Musculoskeletal: Degenerative disc disease is identified within the thoracic spine. Status post bilateral breast augmentation with calcification of the implant capsules. IMPRESSION: 1. Stable bilateral pulmonary nodules. 2. No mediastinal or hilar adenopathy. 3. Aortic Atherosclerosis (  ICD10-I70.0). RCA coronary artery calcifications noted. Electronically Signed   By: Kerby Moors M.D.   On: 03/20/2017 15:55    Medications: I have reviewed the patient's current medications.  Assessment/Plan: 1. 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 History of colonic polyps-status post a surveillance  colonoscopy in February 2016 with a tubular adenoma removed 3. History of heavy tobacco use, quit in 2010 4. History of lung nodules-last imaging was an abdomen CT in October 2013 prior to repeat imaging 09/03/2014  CT 09/03/2014 revealed enlargement of a spiculated right lower lobe nodule  PET scan 09/16/2014 revealed low-level FDG activity associated with the right lower lobe spiculated nodule  Status post a right lower lobe basilar segmentectomy and mediastinal lymph node dissection on 10/06/2014 with the pathology confirming a minimally invasive well-differentiated adenocarcinoma,pT1a,N0, invasive component measured less than 0.3 cm, with negative surgical margins  CT chest 09/15/2015 with no evidence of recurrent lung cancer, stable nodules except for a probable new 4 mm right lower lobe subpleural nodule  CT chest 06/19/2016-no evidence of recurrent lung cancer, stable lung nodules  CT chest 03/20/2017-stable lung nodules  5. Post thoracotomy pain-resolved    Disposition:  Kelli [name] remains in clinical remission from colon cancer and lung cancer. She will be scheduled for an office visit and surveillance chest CT in one year. She will obtain an influenza vaccine via her primary physician.  15 minutes were spent with the patient today. The majority of the time was used for counseling and coordination of care.  03/22/2017  12:53 PM

## 2017-05-09 DIAGNOSIS — C4090 Malignant neoplasm of unspecified bones and articular cartilage of unspecified limb: Secondary | ICD-10-CM | POA: Diagnosis not present

## 2017-05-09 DIAGNOSIS — C189 Malignant neoplasm of colon, unspecified: Secondary | ICD-10-CM | POA: Diagnosis not present

## 2017-05-09 DIAGNOSIS — Z809 Family history of malignant neoplasm, unspecified: Secondary | ICD-10-CM | POA: Diagnosis not present

## 2017-05-09 DIAGNOSIS — Z85038 Personal history of other malignant neoplasm of large intestine: Secondary | ICD-10-CM | POA: Diagnosis not present

## 2017-05-30 ENCOUNTER — Other Ambulatory Visit: Payer: Self-pay | Admitting: Osteopathic Medicine

## 2017-05-31 ENCOUNTER — Ambulatory Visit (INDEPENDENT_AMBULATORY_CARE_PROVIDER_SITE_OTHER): Payer: Medicare Other | Admitting: Osteopathic Medicine

## 2017-05-31 ENCOUNTER — Encounter: Payer: Self-pay | Admitting: Osteopathic Medicine

## 2017-05-31 VITALS — BP 137/63 | HR 67 | Wt 178.0 lb

## 2017-05-31 DIAGNOSIS — L723 Sebaceous cyst: Secondary | ICD-10-CM | POA: Diagnosis not present

## 2017-05-31 DIAGNOSIS — E119 Type 2 diabetes mellitus without complications: Secondary | ICD-10-CM

## 2017-05-31 DIAGNOSIS — G4710 Hypersomnia, unspecified: Secondary | ICD-10-CM

## 2017-05-31 DIAGNOSIS — L918 Other hypertrophic disorders of the skin: Secondary | ICD-10-CM | POA: Diagnosis not present

## 2017-05-31 LAB — POCT GLYCOSYLATED HEMOGLOBIN (HGB A1C): HEMOGLOBIN A1C: 7.7

## 2017-05-31 NOTE — Progress Notes (Signed)
HPI: Kelli [name] is a 70 y.o. female  who presents to [facility] today, 05/31/17,  for chief complaint of:  Chief Complaint  Patient presents with  . Diabetes  . Medication Refill    Diabetes: She is eating fairly healthy, getting some exercise.  10/31/16: 7.2%, metformin advised at that point and started 500 mg daily.  02/26/17: A1C 8.1%.  05/31/17: Today 7.7%   Sleep disturbance: hypersomnia sleeping up to 12-14 hours per day but she is not particularly bothered by it, no daytime somnolence and declines sleep study.                     Skin concern: lump on back and skin tag bothersome and would like the lump removed d/t irritation     Past medical history, surgical history, social history and family history reviewed.  Patient Active Problem List   Diagnosis Date Noted  . Squamous cell carcinoma 03/05/2017  . Echocardiogram shows left ventricular diastolic dysfunction 16/03/9603  . Heart palpitations 07/25/2016  . Need for vaccination for pneumococcus 07/13/2016  . Osteoarthritis of hand, right 08/06/2015  . Diabetes mellitus type 2, uncomplicated ([place]) 54/02/8118  . Lung cancer, lower lobe ([place]) 10/09/2014  . Hematochezia 04/01/2013  . H/O colon cancer, stage II 09/19/2011  . Depression with anxiety 09/19/2011  . Hyperlipidemia 09/19/2011    Current medication list and allergy/intolerance information reviewed.   Current Outpatient Medications on File Prior to Visit  Medication Sig Dispense Refill  . aspirin 81 MG tablet Take 81 mg by mouth daily.    [name] b complex vitamins tablet Take 1 tablet by mouth daily.    . Cholecalciferol (VITAMIN D) 2000 UNITS tablet Take 2,000 Units by mouth daily.    [name] FLUoxetine (PROZAC) 20 MG capsule Take 2 capsules (40 mg total) by mouth daily. 180 capsule 3  . metFORMIN (GLUCOPHAGE-XR) 500 MG 24 hr tablet Take 1 tablet (500 mg total) by mouth 2 (two) times daily. 180 tablet 1  . pravastatin  (PRAVACHOL) 80 MG tablet TAKE 1 TABLET(80 MG) BY MOUTH DAILY 90 tablet 0   No current facility-administered medications on file prior to visit.    Allergies  Allergen Reactions  . Atorvastatin Other (See Comments)    Muscle cramps  . Oxycodone Other (See Comments)    Does not work- Hydrocodone does work      Review of Systems:  Constitutional: No recent illness  HEENT: No  headache, no vision change  Cardiac: No  chest pain, No  pressure, No palpitations  Respiratory:  No  shortness of breath. No  Cough  Musculoskeletal: No new myalgia/arthralgia  Skin: +lump on back would like checked out   Neurologic: No  weakness, No  Dizziness  Psychiatric: No  concerns with depression, No  concerns with anxiety, + sleep disturbance as per history of present illness  Exam:  BP 137/63   Pulse 67   Wt 178 lb (80.7 kg)   BMI 26.29 kg/m   Constitutional: VS see above. General Appearance: alert, well-developed, well-nourished, NAD  Eyes: Normal lids and conjunctive, non-icteric sclera  Ears, Nose, Mouth, Throat: MMM, Normal external inspection ears/nares/mouth/lips/gums.  Neck: No masses, trachea midline.   Respiratory: Normal respiratory effort. no wheeze, no rhonchi, no rales  Cardiovascular: S1/S2 normal, no murmur, no rub/gallop auscultated. RRR.   Musculoskeletal: Gait normal. Symmetric and independent movement of all extremities  Neurological: Normal balance/coordination. No tremor.  Skin: warm, dry, intact. (+)sebaceous  cyst R upper back, skin tag L lower back   Psychiatric: Normal judgment/insight. Normal mood and affect. Oriented x3.     Results for orders placed or performed in visit on 05/31/17 (from the past 24 hour(s))  POCT HgB A1C     Status: None   Collection Time: 05/31/17  2:54 PM  Result Value Ref Range   Hemoglobin A1C 7.7      ASSESSMENT/PLAN:   Type 2 diabetes mellitus without complication, without long-term current use of insulin (HCC) -  declines medication adjustment at this time, would like to try more diligence w/ diet/exercise  - Plan: POCT HgB A1C  Hypersomnia - If would like to order sleep study let me know   Skin tag  Sebaceous cyst     Patient Instructions  Plan:  Watch diet/exercise   If A1C not looking a little better, may increase Metformin      Follow-up plan: Return for cyst removal - FYI for scheduler 40 minute end-of-day appt please and thanks .  Visit summary with medication list and pertinent instructions was printed for patient to review, alert [place] if any changes needed. All questions at time of visit were answered - patient instructed to contact office with any additional concerns. ER/RTC precautions were reviewed with the patient and understanding verbalized.

## 2017-05-31 NOTE — Patient Instructions (Addendum)
Plan:  Watch diet/exercise   If A1C not looking a little better, may increase Metformin

## 2017-06-20 ENCOUNTER — Ambulatory Visit (INDEPENDENT_AMBULATORY_CARE_PROVIDER_SITE_OTHER): Payer: Medicare HMO | Admitting: Osteopathic Medicine

## 2017-06-20 ENCOUNTER — Encounter: Payer: Self-pay | Admitting: Osteopathic Medicine

## 2017-06-20 VITALS — BP 133/57 | HR 79 | Temp 97.9°F | Wt 177.0 lb

## 2017-06-20 DIAGNOSIS — L723 Sebaceous cyst: Secondary | ICD-10-CM

## 2017-06-20 DIAGNOSIS — L918 Other hypertrophic disorders of the skin: Secondary | ICD-10-CM | POA: Diagnosis not present

## 2017-06-20 NOTE — Progress Notes (Signed)
HPI: Kelli [name] is a 72 y.o. female who  has a past medical history of Anxiety, Colon cancer ([place]), Depression, Depression with anxiety (09/19/2011), H/O colon cancer, stage II (09/19/2011), High cholesterol, Hyperlipidemia (09/19/2011), Lung nodules (09/19/2011), and Urinary urgency.  she presents to [facility] today, 06/20/17,  for chief complaint of:  Chief Complaint  Patient presents with  . Mass    subq mass on R upper back c/w cyst Skin tag on L lower back would like removed Both irritate her, catch on clothing. Cyst area becoming sore.    Past medical, surgical, social and family history reviewed:  Patient Active Problem List   Diagnosis Date Noted  . Squamous cell carcinoma 03/05/2017  . Echocardiogram shows left ventricular diastolic dysfunction 01/77/9390  . Heart palpitations 07/25/2016  . Need for vaccination for pneumococcus 07/13/2016  . Osteoarthritis of hand, right 08/06/2015  . Diabetes mellitus type 2, uncomplicated ([place]) 30/02/2329  . Lung cancer, lower lobe ([place]) 10/09/2014  . Hematochezia 04/01/2013  . H/O colon cancer, stage II 09/19/2011  . Depression with anxiety 09/19/2011  . Hyperlipidemia 09/19/2011     Current medication list and allergy/intolerance information reviewed:    Current Outpatient Medications  Medication Sig Dispense Refill  . aspirin 81 MG tablet Take 81 mg by mouth daily.    [name] b complex vitamins tablet Take 1 tablet by mouth daily.    . Cholecalciferol (VITAMIN D) 2000 UNITS tablet Take 2,000 Units by mouth daily.    [name] FLUoxetine (PROZAC) 20 MG capsule Take 2 capsules (40 mg total) by mouth daily. 180 capsule 3  . metFORMIN (GLUCOPHAGE-XR) 500 MG 24 hr tablet Take 1 tablet (500 mg total) by mouth 2 (two) times daily. 180 tablet 1  . pravastatin (PRAVACHOL) 80 MG tablet TAKE 1 TABLET(80 MG) BY MOUTH DAILY 90 tablet 0   No current facility-administered medications for this visit.     Allergies   Allergen Reactions  . Atorvastatin Other (See Comments)    Muscle cramps  . Oxycodone Other (See Comments)    Does not work- Hydrocodone does work      Review of Systems:  Constitutional:  No  fever, no chills, No recent illness,  Skin: No  Rash, +other wounds/concerning lesions as per HPI  Exam:  BP (!) 133/57   Pulse 79   Temp 97.9 F (36.6 C) (Oral)   Wt 177 lb (80.3 kg)   BMI 26.14 kg/m   Constitutional: VS see above. General Appearance: alert, well-developed, well-nourished, NAD  Skin: warm, dry, intact. No rash/ulcer. Subq mass on R upper back c/w sebaceous cyst, skin tag L lower back, no other concerning nevi or subq nodules on limited exam.    Psychiatric: Normal judgment/insight. Normal mood and affect. Oriented x3.    R upper back:  PRE-OP DIAGNOSIS: Sebaceous cyst  POST-OP DIAGNOSIS: Same  PROCEDURE: skin biopsy Performing Physician: Emeterio Reeve   PROCEDURE: Excisional biopsy attempted to remove entire cyst capsule unsuccessfully but achieved incision and evacuation while removing the capsule remnants    The area surrounding the skin lesion was prepared and draped in the usual sterile manner using 2 cm incision and evacuation of sebaceous material, nonpurulent. The lesion was removed in the usual manner by the biopsy method noted above. Hemostasis was assured. The patient tolerated the procedure well.   Closure:      4.0 vicryl x2 in subq tissue, 4.0 prolene x4 dermis/epidermis. Bandage overlying     Followup:  The patient tolerated the procedure well without complications.  Standard post-procedure care is explained and return precautions are given.  L Lower back    PRE-OP DIAGNOSIS: Skin tag POST-OP DIAGNOSIS: Same  PROCEDURE: skin biopsy Performing Physician: Emeterio Reeve   PROCEDURE:  Shave Biopsy    The area surrounding the skin lesion was prepared and draped in the usual sterile manner. The lesion was removed in the usual manner by  the biopsy method noted above by grasping with forceps anc cutting tag of with sterile scissors.. Hemostasis was assured. The patient tolerated the procedure well.   Closure:      None, bandage    Followup: The patient tolerated the procedure well without complications.  Standard post-procedure care is explained and return precautions are given.    ASSESSMENT/PLAN: The primary encounter diagnosis was Sebaceous cyst. A diagnosis of Skin tag was also pertinent to this visit.     Visit summary with medication list and pertinent instructions was printed for patient to review. All questions at time of visit were answered - patient instructed to contact office with any additional concerns. ER/RTC precautions were reviewed with the patient.   Follow-up plan: Return for remove sutures 10-14 days .   Please note: voice recognition software was used to produce this document, and typos may escape review. Please contact Dr. Sheppard Coil for any needed clarifications.

## 2017-06-29 ENCOUNTER — Encounter: Payer: Self-pay | Admitting: Osteopathic Medicine

## 2017-06-29 ENCOUNTER — Ambulatory Visit: Payer: Medicare HMO | Admitting: Osteopathic Medicine

## 2017-06-29 VITALS — BP 132/94 | HR 90 | Temp 98.1°F | Wt 177.1 lb

## 2017-06-29 DIAGNOSIS — L723 Sebaceous cyst: Secondary | ICD-10-CM

## 2017-06-29 DIAGNOSIS — Z4802 Encounter for removal of sutures: Secondary | ICD-10-CM

## 2017-06-29 NOTE — Progress Notes (Signed)
S: No concerns today. Skin a bit itchy but healing  O: Sutures removed - area cleaned and 4 simple interrupted sutures removed without difficulty. Skin healing well on R shoulder blade area.  A/P: The primary encounter diagnosis was Encounter for removal of sutures. A diagnosis of Sebaceous cyst was also pertinent to this visit. Site healing well, any questions/probems, come see me   Return for sugar recheck aroung 08/29/17.

## 2017-07-17 ENCOUNTER — Ambulatory Visit: Payer: Medicare HMO | Admitting: Osteopathic Medicine

## 2017-07-17 DIAGNOSIS — Z0189 Encounter for other specified special examinations: Secondary | ICD-10-CM

## 2017-08-29 ENCOUNTER — Encounter: Payer: Self-pay | Admitting: Osteopathic Medicine

## 2017-08-29 ENCOUNTER — Ambulatory Visit (INDEPENDENT_AMBULATORY_CARE_PROVIDER_SITE_OTHER): Payer: Medicare HMO | Admitting: Osteopathic Medicine

## 2017-08-29 VITALS — BP 136/47 | HR 75 | Temp 97.6°F | Wt 174.1 lb

## 2017-08-29 DIAGNOSIS — E119 Type 2 diabetes mellitus without complications: Secondary | ICD-10-CM | POA: Diagnosis not present

## 2017-08-29 DIAGNOSIS — F418 Other specified anxiety disorders: Secondary | ICD-10-CM | POA: Diagnosis not present

## 2017-08-29 DIAGNOSIS — L723 Sebaceous cyst: Secondary | ICD-10-CM

## 2017-08-29 DIAGNOSIS — E782 Mixed hyperlipidemia: Secondary | ICD-10-CM | POA: Diagnosis not present

## 2017-08-29 DIAGNOSIS — R69 Illness, unspecified: Secondary | ICD-10-CM | POA: Diagnosis not present

## 2017-08-29 LAB — POCT GLYCOSYLATED HEMOGLOBIN (HGB A1C): Hemoglobin A1C: 7.6

## 2017-08-29 MED ORDER — FLUOXETINE HCL 40 MG PO CAPS: 40.0000 mg | ORAL_CAPSULE | Freq: Every day | ORAL | 3 refills | Status: DC

## 2017-08-29 MED ORDER — PRAVASTATIN SODIUM 80 MG PO TABS: 80.0000 mg | ORAL_TABLET | Freq: Every day | ORAL | 3 refills | Status: DC

## 2017-08-29 MED ORDER — METFORMIN HCL ER 500 MG PO TB24: 500.0000 mg | ORAL_TABLET | Freq: Three times a day (TID) | ORAL | 3 refills | Status: DC

## 2017-08-29 NOTE — Progress Notes (Signed)
HPI: Kelli [name] is a 72 y.o. female  who presents to [facility] today, 08/29/17,  for chief complaint of:  Diabetes follow-up  Diabetes: She is eating fairly healthy, getting some exercise.   10/31/16: 7.2%, metformin advised at that point and started 500 mg daily.   02/26/17: A1C 8.1%. Advised titration up on metformin to 1000 bid  05/31/17: 7.7% we discussed increasing metformin if no better, she was on Metfromin XR taking 500 mg bid  Today 08/29/17: 7.6%                     Skin concern: recently drained sebaceous cyst on back but it's itching a bit.     Past medical history, surgical history, social history and family history reviewed.  Patient Active Problem List   Diagnosis Date Noted  . Squamous cell carcinoma 03/05/2017  . Echocardiogram shows left ventricular diastolic dysfunction 62/37/6283  . Heart palpitations 07/25/2016  . Need for vaccination for pneumococcus 07/13/2016  . Osteoarthritis of hand, right 08/06/2015  . Diabetes mellitus type 2, uncomplicated ([place]) 15/17/6160  . Lung cancer, lower lobe ([place]) 10/09/2014  . Hematochezia 04/01/2013  . H/O colon cancer, stage II 09/19/2011  . Depression with anxiety 09/19/2011  . Hyperlipidemia 09/19/2011    Current medication list and allergy/intolerance information reviewed.   Current Outpatient Medications on File Prior to Visit  Medication Sig Dispense Refill  . aspirin 81 MG tablet Take 81 mg by mouth daily.    [name] b complex vitamins tablet Take 1 tablet by mouth daily.    . Cholecalciferol (VITAMIN D) 2000 UNITS tablet Take 2,000 Units by mouth daily.    [name] FLUoxetine (PROZAC) 20 MG capsule Take 2 capsules (40 mg total) by mouth daily. 180 capsule 3  . metFORMIN (GLUCOPHAGE-XR) 500 MG 24 hr tablet Take 1 tablet (500 mg total) by mouth 2 (two) times daily. 180 tablet 1  . pravastatin (PRAVACHOL) 80 MG tablet TAKE 1 TABLET(80 MG) BY MOUTH DAILY 90 tablet 0   No current  facility-administered medications on file prior to visit.    Allergies  Allergen Reactions  . Atorvastatin Other (See Comments)    Muscle cramps  . Oxycodone Other (See Comments)    Does not work- Hydrocodone does work      Review of Systems:  Constitutional: No recent illness  HEENT: No  headache, no vision change  Cardiac: No  chest pain, No  pressure, No palpitations  Respiratory:  No  shortness of breath. No  Cough  Musculoskeletal: No new myalgia/arthralgia  Skin: +lump on back where we drained cyst   Neurologic: No  weakness, No  Dizziness  Psychiatric: No  concerns with depression, No  concerns with anxiety  Exam:  BP (!) 136/47 (BP Location: Left Arm, Patient Position: Sitting, Cuff Size: Normal)   Pulse 75   Temp 97.6 F (36.4 C) (Oral)   Wt 174 lb 1.3 oz (79 kg)   BMI 25.71 kg/m   Constitutional: VS see above. General Appearance: alert, well-developed, well-nourished, NAD  Eyes: Normal lids and conjunctive, non-icteric sclera  Ears, Nose, Mouth, Throat: MMM, Normal external inspection ears/nares/mouth/lips/gums.  Neck: No masses, trachea midline.   Respiratory: Normal respiratory effort. no wheeze, no rhonchi, no rales  Cardiovascular: S1/S2 normal, no murmur, no rub/gallop auscultated. RRR.   Musculoskeletal: Gait normal. Symmetric and independent movement of all extremities  Neurological: Normal balance/coordination. No tremor.  Skin: warm, dry, intact. (+)sebaceous cyst R upper  back material drained easily once scab removed, no blood/pus  Psychiatric: Normal judgment/insight. Normal mood and affect. Oriented x3.     Results for orders placed or performed in visit on 08/29/17 (from the past 24 hour(s))  POCT HgB A1C     Status: None   Collection Time: 08/29/17 11:54 AM  Result Value Ref Range   Hemoglobin A1C 7.6      ASSESSMENT/PLAN: Increase metformin. Consider max dose or combo drug if A1C not closer to 7 next time. 1 year refill all  other meds at this time   Type 2 diabetes mellitus without complication, without long-term current use of insulin (HCC) - Plan: POCT HgB A1C  Depression with anxiety - Plan: FLUoxetine (PROZAC) 40 MG capsule  Sebaceous cyst  Mixed hyperlipidemia   Meds ordered this encounter  Medications  . metFORMIN (GLUCOPHAGE-XR) 500 MG 24 hr tablet    Sig: Take 1 tablet (500 mg total) by mouth 3 (three) times daily. Two in the morning and one in the evening    Dispense:  270 tablet    Refill:  3    Cancel the previous 180 / bid dose thanks  . FLUoxetine (PROZAC) 40 MG capsule    Sig: Take 1 capsule (40 mg total) by mouth daily.    Dispense:  90 capsule    Refill:  3  . pravastatin (PRAVACHOL) 80 MG tablet    Sig: Take 1 tablet (80 mg total) by mouth daily.    Dispense:  90 tablet    Refill:  3        Follow-up plan: Return in about 3 months (around 11/29/2017) for recheck A1C/diabetes .  Visit summary with medication list and pertinent instructions was printed for patient to review, alert [place] if any changes needed. All questions at time of visit were answered - patient instructed to contact office with any additional concerns. ER/RTC precautions were reviewed with the patient and understanding verbalized.

## 2017-09-08 ENCOUNTER — Other Ambulatory Visit: Payer: Self-pay | Admitting: Osteopathic Medicine

## 2017-09-11 ENCOUNTER — Telehealth: Payer: Self-pay

## 2017-09-11 NOTE — Telephone Encounter (Signed)
Pt has been updated with provider's note. No other inquiries asked during call.

## 2017-09-11 NOTE — Telephone Encounter (Signed)
Can go back to original dose of metformin, I will send an alternative medication to her pharmacy

## 2017-09-11 NOTE — Telephone Encounter (Signed)
Pt called stating that with increased dose of metformin, she has been having severe diarrhea that has not resolved. She is also getting sick to her stomach every time she has a meal. Pt states that stomach issues passes after some time, but it continues to happen whenever she eats. Pt wants to know if she should continue the metformin medication. Pls advise, thanks.

## 2017-09-18 ENCOUNTER — Ambulatory Visit (INDEPENDENT_AMBULATORY_CARE_PROVIDER_SITE_OTHER): Payer: Medicare HMO | Admitting: Family Medicine

## 2017-09-18 ENCOUNTER — Encounter: Payer: Self-pay | Admitting: Family Medicine

## 2017-09-18 VITALS — BP 138/71 | HR 80 | Temp 97.8°F | Ht 68.0 in | Wt 176.0 lb

## 2017-09-18 DIAGNOSIS — E119 Type 2 diabetes mellitus without complications: Secondary | ICD-10-CM

## 2017-09-18 DIAGNOSIS — Z85038 Personal history of other malignant neoplasm of large intestine: Secondary | ICD-10-CM | POA: Diagnosis not present

## 2017-09-18 DIAGNOSIS — M25511 Pain in right shoulder: Secondary | ICD-10-CM

## 2017-09-18 NOTE — Patient Instructions (Signed)
Thank you for coming in today. Call or go to the ER if you develop a large red swollen joint with extreme pain or oozing puss.  Continue home exercises.    Shoulder Impingement Syndrome Rehab Ask your health care provider which exercises are safe for you. Do exercises exactly as told by your health care provider and adjust them as directed. It is normal to feel mild stretching, pulling, tightness, or discomfort as you do these exercises, but you should stop right away if you feel sudden pain or your pain gets worse.Do not begin these exercises until told by your health care provider. Stretching and range of motion exercise This exercise warms up your muscles and joints and improves the movement and flexibility of your shoulder. This exercise also helps to relieve pain and stiffness. Exercise A: Passive horizontal adduction  1. Sit or stand and pull your left / right elbow across your chest, toward your other shoulder. Stop when you feel a gentle stretch in the back of your shoulder and upper arm. ? Keep your arm at shoulder height. ? Keep your arm as close to your body as you comfortably can. 2. Hold for __________ seconds. 3. Slowly return to the starting position. Repeat __________ times. Complete this exercise __________ times a day. Strengthening exercises These exercises build strength and endurance in your shoulder. Endurance is the ability to use your muscles for a long time, even after they get tired. Exercise B: External rotation, isometric 1. Stand or sit in a doorway, facing the door frame. 2. Bend your left / right elbow and place the back of your wrist against the door frame. Only your wrist should be touching the frame. Keep your upper arm at your side. 3. Gently press your wrist against the door frame, as if you are trying to push your arm away from your abdomen. ? Avoid shrugging your shoulder while you press your hand against the door frame. Keep your shoulder blade tucked  down toward the middle of your back. 4. Hold for __________ seconds. 5. Slowly release the tension, and relax your muscles completely before you do the exercise again. Repeat __________ times. Complete this exercise __________ times a day. Exercise C: Internal rotation, isometric  1. Stand or sit in a doorway, facing the door frame. 2. Bend your left / right elbow and place the inside of your wrist against the door frame. Only your wrist should be touching the frame. Keep your upper arm at your side. 3. Gently press your wrist against the door frame, as if you are trying to push your arm toward your abdomen. ? Avoid shrugging your shoulder while you press your hand against the door frame. Keep your shoulder blade tucked down toward the middle of your back. 4. Hold for __________ seconds. 5. Slowly release the tension, and relax your muscles completely before you do the exercise again. Repeat __________ times. Complete this exercise __________ times a day. Exercise D: Scapular protraction, supine  1. Lie on your back on a firm surface. Hold a __________ weight in your left / right hand. 2. Raise your left / right arm straight into the air so your hand is directly above your shoulder joint. 3. Push the weight into the air so your shoulder lifts off of the surface that you are lying on. Do not move your head, neck, or back. 4. Hold for __________ seconds. 5. Slowly return to the starting position. Let your muscles relax completely before you repeat this exercise.  Repeat __________ times. Complete this exercise __________ times a day. Exercise E: Scapular retraction  1. Sit in a stable chair without armrests, or stand. 2. Secure an exercise band to a stable object in front of you so the band is at shoulder height. 3. Hold one end of the exercise band in each hand. Your palms should face down. 4. Squeeze your shoulder blades together and move your elbows slightly behind you. Do not shrug your  shoulders while you do this. 5. Hold for __________ seconds. 6. Slowly return to the starting position. Repeat __________ times. Complete this exercise __________ times a day. Exercise F: Shoulder extension  1. Sit in a stable chair without armrests, or stand. 2. Secure an exercise band to a stable object in front of you where the band is above shoulder height. 3. Hold one end of the exercise band in each hand. 4. Straighten your elbows and lift your hands up to shoulder height. 5. Squeeze your shoulder blades together and pull your hands down to the sides of your thighs. Stop when your hands are straight down by your sides. Do not let your hands go behind your body. 6. Hold for __________ seconds. 7. Slowly return to the starting position. Repeat __________ times. Complete this exercise __________ times a day. This information is not intended to replace advice given to you by your health care provider. Make sure you discuss any questions you have with your health care provider. Document Released: 05/22/2005 Document Revised: 01/27/2016 Document Reviewed: 04/24/2015 Elsevier Interactive Patient Education  [employer].

## 2017-09-18 NOTE — Progress Notes (Signed)
Kelli [name] is a 72 y.o. female who presents to [facility] today for right shoulder pain.   Kelli [name] notes a long history of right shoulder pain.she was seen in February 2018 where she received a subacromial injection providing excellent pain control recently.  .  She denies any recent injury. She notes that the pain occurs in the lateral upper arm and is worse with overhead motion and reaching back and at night. She has not tried much treatment.  She no longer is continuing her typical home exercise program.  No chest pain palpitations shortness of breath night sweats or weight loss.  Pertinent history for colon cancer in remission since 2010 and Lung Cancer in remission since 2016   See Dr Benay Spice note from 03/22/17 for detailed history.   Past Medical History:  Diagnosis Date  . Anxiety   . Colon cancer ([place])    colon ca dx 02/2009  . Depression   . Depression with anxiety 09/19/2011  . H/O colon cancer, stage II 09/19/2011   T3N0  [place] Sept 2010  . High cholesterol   . Hyperlipidemia 09/19/2011  . Lung nodules 09/19/2011   granulomas  . Urinary urgency    Past Surgical History:  Procedure Laterality Date  . APPENDECTOMY    . BREAST SURGERY  1978   augmentation  . COLON SURGERY     2010  . CRYO INTERCOSTAL NERVE BLOCK N/A 10/05/2014   Procedure: CRYO INTERCOSTAL NERVE BLOCK;  Surgeon: [name], MD;  Location: [facility];  Service: Thoracic;  Laterality: N/A;  . LYMPH NODE DISSECTION Right 10/05/2014   Procedure: LYMPH NODE DISSECTION;  Surgeon: [name], MD;  Location: [facility];  Service: Thoracic;  Laterality: Right;  . PARTIAL COLECTOMY Right 2010  . SEGMENTECOMY Right 10/05/2014   Procedure: right lower lobe SEGMENTECTOMY;  Surgeon: [name], MD;  Location: [facility];  Service: Thoracic;  Laterality: Right;  [name] VIDEO ASSISTED THORACOSCOPY (VATS)/WEDGE RESECTION Right 10/05/2014   Procedure: Right VIDEO ASSISTED  THORACOSCOPY (VATS) with right lower lobe lung nodule WEDGE RESECTION;  Surgeon: [name], MD;  Location: [facility];  Service: Thoracic;  Laterality: Right;   Social History   Tobacco Use  . Smoking status: Former Smoker    Packs/day: 3.00    Years: 40.00    Pack years: 120.00    Types: Cigarettes    Last attempt to quit: 02/06/2009    Years since quitting: 8.6  . Smokeless tobacco: Never Used  Substance Use Topics  . Alcohol use: Yes    Alcohol/week: 0.0 oz    Comment: 3-4 times a week 1-2 beers     ROS:  As above   Medications: Current Outpatient Medications  Medication Sig Dispense Refill  . aspirin 81 MG tablet Take 81 mg by mouth daily.    [name] b complex vitamins tablet Take 1 tablet by mouth daily.    . Cholecalciferol (VITAMIN D) 2000 UNITS tablet Take 2,000 Units by mouth daily.    [name] FLUoxetine (PROZAC) 40 MG capsule Take 1 capsule (40 mg total) by mouth daily. 90 capsule 3  . metFORMIN (GLUCOPHAGE-XR) 500 MG 24 hr tablet Take 1 tablet (500 mg total) by mouth 3 (three) times daily. Two in the morning and one in the evening 270 tablet 3  . pravastatin (PRAVACHOL) 80 MG tablet Take 1 tablet (80 mg total) by mouth daily. 90 tablet 3   No current facility-administered medications for  this visit.    Allergies  Allergen Reactions  . Atorvastatin Other (See Comments)    Muscle cramps  . Oxycodone Other (See Comments)    Does not work- Hydrocodone does work     Exam:  BP 138/71   Pulse 80   Temp 97.8 F (36.6 C) (Oral)   Ht 5\' 8"  (1.727 m)   Wt 176 lb (79.8 kg)   BMI 26.76 kg/m  General: Well Developed, well nourished, and in no acute distress.  Neuro/Psych: Alert and oriented x3, extra-ocular muscles intact, able to move all 4 extremities, sensation grossly intact. Skin: Warm and dry, no rashes noted.  Respiratory: Not using accessory muscles, speaking in full sentences, trachea midline.  Cardiovascular: Pulses palpable, no extremity edema. Abdomen: Does  not appear distended. MSK: Right shoulder is normal-appearing. Tender palpation overlying the acromioclavicular joint. Normal motion but painful arc. Positive Hawkins and Neer's test. Negative Yergason's and speeds test. Positive crossover arm compression test. Intact strength but pain with resisted abduction and external rotation present.  Procedure: Real-time Ultrasound Guided Injection of right subacromial space  Device: GE Logiq E  Images permanently stored and available for review in the ultrasound unit. Verbal informed consent obtained. Discussed risks and benefits of procedure. Warned about infection bleeding damage to structures skin hypopigmentation and fat atrophy among others. Patient expresses understanding and agreement Time-out conducted.  Noted no overlying erythema, induration, or other signs of local infection.  Skin prepped in a sterile fashion.  Local anesthesia: Topical Ethyl chloride.  With sterile technique and under real time ultrasound guidance: 40mg  kenalog and 72ml marcaine injected easily.  Completed without difficulty  Pain immediately resolved suggesting accurate placement of the medication.  Advised to call if fevers/chills, erythema, induration, drainage, or persistent bleeding.  Images permanently stored and available for review in the ultrasound unit.  Impression: Technically successful ultrasound guided injection.     Study Result   CLINICAL DATA:  Rt Shoulder pain from lifting a lot of heavy books. She strained her right shoulder. It has gradually gotten worse rather than better  EXAM: RIGHT SHOULDER - 2+ VIEW  COMPARISON:  None.  FINDINGS: No fracture.  No bone lesion.  Mild AC joint osteoarthritis reflected by mild joint space narrowing and small marginal osteophytes.  The glenohumeral joint is normally spaced and aligned. No significant arthropathic change.  There is a small subacromial spur.  Bones are  demineralized.  Soft tissues are unremarkable.  IMPRESSION: 1. No fracture or dislocation. 2. Mild AC joint osteoarthritis. 3. Small subacromial spur.   Electronically Signed   By: Lajean Manes M.D.   On: 03/09/2016 15:57   I personally (independently) visualized and performed the interpretation of the images attached in this note.     No results found for this or any previous visit (from the past 48 hour(s)). No results found.    Assessment and Plan: 72 y.o. female with  Right shoulder pain very likely due to rotator cuff tendinopathy subacromial bursitis or impingement. There also is a component of acromioclavicular DJD. Patient had considerable improvement following subacromial injection today.  Recheck PRN.  Doubtful that her cancer history has anything to do with her current pain however she is not improving will proceed with MRI with contrast to further evaluate potential metastatic disease as a cause of pain.  Additionally discussed DM with patient. Doubtful of contributory but could be better controlled.  Follow up with PCP.     Discussed warning signs or symptoms. Please see  discharge instructions. Patient expresses understanding.

## 2017-09-30 ENCOUNTER — Other Ambulatory Visit: Payer: Self-pay | Admitting: Osteopathic Medicine

## 2017-10-30 ENCOUNTER — Encounter: Payer: Self-pay | Admitting: Family Medicine

## 2017-11-29 ENCOUNTER — Encounter: Payer: Self-pay | Admitting: Osteopathic Medicine

## 2017-11-29 ENCOUNTER — Ambulatory Visit (INDEPENDENT_AMBULATORY_CARE_PROVIDER_SITE_OTHER): Payer: Medicare HMO | Admitting: Osteopathic Medicine

## 2017-11-29 VITALS — BP 136/74 | HR 71 | Temp 97.9°F | Wt 173.0 lb

## 2017-11-29 DIAGNOSIS — G4710 Hypersomnia, unspecified: Secondary | ICD-10-CM

## 2017-11-29 DIAGNOSIS — E119 Type 2 diabetes mellitus without complications: Secondary | ICD-10-CM | POA: Diagnosis not present

## 2017-11-29 LAB — POCT GLYCOSYLATED HEMOGLOBIN (HGB A1C): Hemoglobin A1C: 7.2 % — AB (ref 4.0–5.6)

## 2017-11-29 NOTE — Patient Instructions (Signed)
Plan:  Get better with diet! Exercise as you're able. A1C is ok for now.   Labs today

## 2017-11-29 NOTE — Progress Notes (Signed)
HPI: Kelli [name] is a 72 y.o. female  who presents to [facility] today, 11/29/17,  for chief complaint of:  Diabetes follow-up  Diabetes: She is eating fairly healthy, getting some exercise.   10/31/16: 7.2%, metformin advised at that point and started 500 mg daily.  02/26/17: A1C 8.1%. Advised titration up on metformin to 1000 bid  05/31/17: 7.7% Never went up on metformin but did improve A1C though not to goal yet - we discussed increasing metformin if no better, she was only on Metfromin XR taking 500 mg bid  08/29/17: 7.6% increased Metformin to 500 mg tid (2 in am ok) but this caused diarrhea issues so went back to twice per day   Today 11/29/17: 7.2% has been bad about diet lately, could exercise more as well. Overall doing well though.   Sleeping an awful lot lately. Similar complaint last fall, never got labs done. No night sweats/fever. No weight change.     Past medical history, surgical history, social history and family history reviewed.  Patient Active Problem List   Diagnosis Date Noted  . Squamous cell carcinoma 03/05/2017  . Echocardiogram shows left ventricular diastolic dysfunction 66/11/3014  . Heart palpitations 07/25/2016  . Need for vaccination for pneumococcus 07/13/2016  . Osteoarthritis of hand, right 08/06/2015  . Diabetes mellitus type 2, uncomplicated ([place]) 06/13/3233  . Lung cancer, lower lobe ([place]) 10/09/2014  . Hematochezia 04/01/2013  . H/O colon cancer, stage II 09/19/2011  . Depression with anxiety 09/19/2011  . Hyperlipidemia 09/19/2011    Current medication list and allergy/intolerance information reviewed.   Current Outpatient Medications on File Prior to Visit  Medication Sig Dispense Refill  . aspirin 81 MG tablet Take 81 mg by mouth daily.    [name] b complex vitamins tablet Take 1 tablet by mouth daily.    . Cholecalciferol (VITAMIN D) 2000 UNITS tablet Take 2,000 Units by mouth daily.    [name]  FLUoxetine (PROZAC) 40 MG capsule Take 1 capsule (40 mg total) by mouth daily. 90 capsule 3  . metFORMIN (GLUCOPHAGE-XR) 500 MG 24 hr tablet Take 1 tablet (500 mg total) by mouth 3 (three) times daily. Two in the morning and one in the evening 270 tablet 3  . pravastatin (PRAVACHOL) 80 MG tablet Take 1 tablet (80 mg total) by mouth daily. 90 tablet 3   No current facility-administered medications on file prior to visit.    Allergies  Allergen Reactions  . Atorvastatin Other (See Comments)    Muscle cramps  . Oxycodone Other (See Comments)    Does not work- Hydrocodone does work      Review of Systems:  Constitutional: No recent illness  HEENT: No  headache, no vision change  Cardiac: No  chest pain, No  pressure  Respiratory:  No  shortness of breath. No  Cough  Musculoskeletal: No new myalgia/arthralgia  Neurologic: No  weakness, No  Dizziness  Psychiatric: No  concerns with depression, No  concerns with anxiety  Exam:  BP 136/74 (BP Location: Left Arm, Patient Position: Sitting, Cuff Size: Normal)   Pulse 71   Temp 97.9 F (36.6 C) (Oral)   Wt 173 lb (78.5 kg)   BMI 26.30 kg/m   Constitutional: VS see above. General Appearance: alert, well-developed, well-nourished, NAD  Eyes: Normal lids and conjunctive, non-icteric sclera  Ears, Nose, Mouth, Throat: MMM, Normal external inspection ears/nares/mouth/lips/gums.  Neck: No masses, trachea midline.   Respiratory: Normal respiratory effort. no wheeze, no  rhonchi, no rales  Cardiovascular: S1/S2 normal, no murmur, no rub/gallop auscultated. RRR.   Musculoskeletal: Gait normal. Symmetric and independent movement of all extremities  Neurological: Normal balance/coordination. No tremor.  Psychiatric: Normal judgment/insight. Normal mood and affect. Oriented x3.     Results for orders placed or performed in visit on 11/29/17 (from the past 24 hour(s))  POCT HgB A1C     Status: Abnormal   Collection Time: 11/29/17   1:38 PM  Result Value Ref Range   Hemoglobin A1C 7.2 (A) 4.0 - 5.6 %   HbA1c POC (<> result, manual entry)  4.0 - 5.6 %   HbA1c, POC (prediabetic range)  5.7 - 6.4 %   HbA1c, POC (controlled diabetic range)  0.0 - 7.0 %     ASSESSMENT/PLAN:   Type 2 diabetes mellitus without complication, without long-term current use of insulin (H[facility]) - diet/exercise needs to be optimized. Discussed A1C not quite to goal, pt would like to hold off on med change for now  - Plan: POCT HgB A1C, COMPLETE METABOLIC PANEL WITH GFR, CBC with Differential/Platelet, Lipid panel, TSH, VITAMIN D 25 Hydroxy (Vit-D Deficiency, Fractures), Magnesium, Urinalysis, Routine w reflex microscopic, Vitamin B12  Hypersomnolence - consider sleep med referral  - Plan: COMPLETE METABOLIC PANEL WITH GFR, CBC with Differential/Platelet, Lipid panel, TSH, VITAMIN D 25 Hydroxy (Vit-D Deficiency, Fractures), Magnesium, Urinalysis, Routine w reflex microscopic, Vitamin B12    Follow-up plan: Return in about 3 months (around 03/01/2018) for recheck A1C, sooner if needed .  Visit summary with medication list and pertinent instructions was printed for patient to review, alert [place] if any changes needed. All questions at time of visit were answered - patient instructed to contact office with any additional concerns. ER/RTC precautions were reviewed with the patient and understanding verbalized.

## 2017-11-30 LAB — URINALYSIS, ROUTINE W REFLEX MICROSCOPIC
BILIRUBIN URINE: NEGATIVE
GLUCOSE, UA: NEGATIVE
Hgb urine dipstick: NEGATIVE
Ketones, ur: NEGATIVE
LEUKOCYTES UA: NEGATIVE
Nitrite: NEGATIVE
PROTEIN: NEGATIVE
Specific Gravity, Urine: 1.028 (ref 1.001–1.03)
pH: <=5 (ref 5.0–8.0)

## 2017-11-30 LAB — COMPLETE METABOLIC PANEL WITH GFR
AG Ratio: 1.9 (calc) (ref 1.0–2.5)
ALBUMIN MSPROF: 4.3 g/dL (ref 3.6–5.1)
ALKALINE PHOSPHATASE (APISO): 85 U/L (ref 33–130)
ALT: 17 U/L (ref 6–29)
AST: 15 U/L (ref 10–35)
BILIRUBIN TOTAL: 0.3 mg/dL (ref 0.2–1.2)
BUN: 14 mg/dL (ref 7–25)
CHLORIDE: 103 mmol/L (ref 98–110)
CO2: 27 mmol/L (ref 20–32)
CREATININE: 0.73 mg/dL (ref 0.50–0.99)
Calcium: 9.5 mg/dL (ref 8.6–10.4)
GFR, Est African American: 97 mL/min/{1.73_m2} (ref >=60)
GFR, Est Non African American: 84 mL/min/{1.73_m2} (ref >=60)
GLOBULIN: 2.3 g/dL (ref 1.9–3.7)
GLUCOSE: 219 mg/dL — AB (ref 65–139)
Potassium: 4.3 mmol/L (ref 3.5–5.3)
SODIUM: 138 mmol/L (ref 135–146)
Total Protein: 6.6 g/dL (ref 6.1–8.1)

## 2017-11-30 LAB — CBC WITH DIFFERENTIAL/PLATELET
Basophils Absolute: 80 cells/uL (ref 0–200)
Basophils Relative: 0.9 %
EOS PCT: 4.2 %
Eosinophils Absolute: 374 cells/uL (ref 15–500)
HCT: 36.6 % (ref 35.0–45.0)
HEMOGLOBIN: 12.4 g/dL (ref 11.7–15.5)
Lymphs Abs: 2305 cells/uL (ref 850–3900)
MCH: 30.8 pg (ref 27.0–33.0)
MCHC: 33.9 g/dL (ref 32.0–36.0)
MCV: 91 fL (ref 80.0–100.0)
MONOS PCT: 5.1 %
MPV: 10.5 fL (ref 7.5–12.5)
NEUTROS PCT: 63.9 %
Neutro Abs: 5687 cells/uL (ref 1500–7800)
Platelets: 340 10*3/uL (ref 140–400)
RBC: 4.02 10*6/uL (ref 3.80–5.10)
RDW: 12.7 % (ref 11.0–15.0)
Total Lymphocyte: 25.9 %
WBC mixed population: 454 cells/uL (ref 200–950)
WBC: 8.9 10*3/uL (ref 3.8–10.8)

## 2017-11-30 LAB — TSH: TSH: 2.46 mIU/L (ref 0.40–4.50)

## 2017-11-30 LAB — LIPID PANEL
CHOL/HDL RATIO: 4.3 (calc) (ref <5.0)
CHOLESTEROL: 220 mg/dL — AB (ref <200)
HDL: 51 mg/dL (ref >50)
LDL Cholesterol (Calc): 136 mg/dL (calc) — ABNORMAL HIGH
NON-HDL CHOLESTEROL (CALC): 169 mg/dL — AB (ref <130)
Triglycerides: 191 mg/dL — ABNORMAL HIGH (ref <150)

## 2017-11-30 LAB — VITAMIN D 25 HYDROXY (VIT D DEFICIENCY, FRACTURES): VIT D 25 HYDROXY: 34 ng/mL (ref 30–100)

## 2017-11-30 LAB — MAGNESIUM: MAGNESIUM: 1.8 mg/dL (ref 1.5–2.5)

## 2018-01-10 DIAGNOSIS — Z7984 Long term (current) use of oral hypoglycemic drugs: Secondary | ICD-10-CM | POA: Diagnosis not present

## 2018-01-10 DIAGNOSIS — Z8249 Family history of ischemic heart disease and other diseases of the circulatory system: Secondary | ICD-10-CM | POA: Diagnosis not present

## 2018-01-10 DIAGNOSIS — E1151 Type 2 diabetes mellitus with diabetic peripheral angiopathy without gangrene: Secondary | ICD-10-CM | POA: Diagnosis not present

## 2018-01-10 DIAGNOSIS — Z820 Family history of epilepsy and other diseases of the nervous system: Secondary | ICD-10-CM | POA: Diagnosis not present

## 2018-01-10 DIAGNOSIS — R69 Illness, unspecified: Secondary | ICD-10-CM | POA: Diagnosis not present

## 2018-01-10 DIAGNOSIS — Z809 Family history of malignant neoplasm, unspecified: Secondary | ICD-10-CM | POA: Diagnosis not present

## 2018-01-10 DIAGNOSIS — Z801 Family history of malignant neoplasm of trachea, bronchus and lung: Secondary | ICD-10-CM | POA: Diagnosis not present

## 2018-01-10 DIAGNOSIS — E785 Hyperlipidemia, unspecified: Secondary | ICD-10-CM | POA: Diagnosis not present

## 2018-01-10 DIAGNOSIS — R32 Unspecified urinary incontinence: Secondary | ICD-10-CM | POA: Diagnosis not present

## 2018-01-10 DIAGNOSIS — F419 Anxiety disorder, unspecified: Secondary | ICD-10-CM | POA: Diagnosis not present

## 2018-02-27 ENCOUNTER — Telehealth: Payer: Self-pay | Admitting: Oncology

## 2018-02-27 NOTE — Telephone Encounter (Signed)
GBS PAL 10/18 - moved f/u to 11/1. Spoke with and per patient moved from 11/1 to 11/4 - needs later appointment.

## 2018-02-28 ENCOUNTER — Ambulatory Visit: Payer: Medicare HMO | Admitting: Osteopathic Medicine

## 2018-03-11 ENCOUNTER — Encounter: Payer: Self-pay | Admitting: Osteopathic Medicine

## 2018-03-11 ENCOUNTER — Ambulatory Visit (INDEPENDENT_AMBULATORY_CARE_PROVIDER_SITE_OTHER): Payer: Medicare HMO | Admitting: Osteopathic Medicine

## 2018-03-11 VITALS — BP 120/55 | HR 83 | Temp 98.0°F | Wt 178.3 lb

## 2018-03-11 DIAGNOSIS — E119 Type 2 diabetes mellitus without complications: Secondary | ICD-10-CM

## 2018-03-11 LAB — POCT GLYCOSYLATED HEMOGLOBIN (HGB A1C): Hemoglobin A1C: 8.9 % — AB (ref 4.0–5.6)

## 2018-03-11 MED ORDER — EMPAGLIFLOZIN 25 MG PO TABS: 25.0000 mg | ORAL_TABLET | Freq: Every day | ORAL | 1 refills | Status: DC

## 2018-03-11 NOTE — Patient Instructions (Signed)
Plan: Continue Metformin Add Jardiance  Watch the carbs!

## 2018-03-11 NOTE — Progress Notes (Signed)
HPI: Kelli [name] is a 72 y.o. female who  has a past medical history of Anxiety, Colon cancer ([place]), Depression, Depression with anxiety (09/19/2011), H/O colon cancer, stage II (09/19/2011), High cholesterol, Hyperlipidemia (09/19/2011), Lung nodules (09/19/2011), and Urinary urgency.  she presents to [facility] today, 03/11/18,  for chief complaint of:  DM2 follow-up  Diabetes: She is eating fairly healthy, getting some exercise.   10/31/16: 7.2%, metformin advised at that point and started 500 mg daily.  02/26/17: A1C 8.1%. Advised titration up on metformin to 1000 bid  05/31/17: 7.7% Never went up on metformin but did improve A1C though not to goal yet - we discussed increasing metformin if no better, she was only on Metfromin XR taking 500 mg bid  08/29/17: 7.6% increased Metformin to 500 mg tid (2 in am ok) but this caused diarrhea issues so went back to twice per day   11/29/17: 7.2% has been bad about diet lately, could exercise more as well. Overall doing well though.   Today 03/11/18: 8.9% was eating high protein and limiting carbs but noted some weight gain so she's back to just general lower-carb diet.   Results for orders placed or performed in visit on 03/11/18 (from the past 24 hour(s))  POCT HgB A1C     Status: Abnormal   Collection Time: 03/11/18  1:42 PM  Result Value Ref Range   Hemoglobin A1C 8.9 (A) 4.0 - 5.6 %   HbA1c POC (<> result, manual entry)     HbA1c, POC (prediabetic range)     HbA1c, POC (controlled diabetic range)           Past medical history, surgical history, and family history reviewed.  Current medication list and allergy/intolerance information reviewed.   (See remainder of HPI, ROS, Phys Exam below)  No results found.  Results for orders placed or performed in visit on 03/11/18 (from the past 72 hour(s))  POCT HgB A1C     Status: Abnormal   Collection Time: 03/11/18  1:42 PM  Result Value Ref  Range   Hemoglobin A1C 8.9 (A) 4.0 - 5.6 %   HbA1c POC (<> result, manual entry)     HbA1c, POC (prediabetic range)     HbA1c, POC (controlled diabetic range)       ASSESSMENT/PLAN:   Type 2 diabetes mellitus without complication, without long-term current use of insulin ([place]) - Plan: POCT HgB A1C   Meds ordered this encounter  Medications  . empagliflozin (JARDIANCE) 25 MG TABS tablet    Sig: Take 25 mg by mouth daily.    Dispense:  90 tablet    Refill:  1    Patient Instructions  Plan: Continue Metformin Add Jardiance  Watch the carbs!    Follow-up plan: Return in about 3 months (around 06/11/2018) for A1C recheck, sooner if needed.                   ############################################ ############################################ ############################################ ############################################    Outpatient Encounter Medications as of 03/11/2018  Medication Sig Note  . aspirin 81 MG tablet Take 81 mg by mouth daily. 11/03/2014: .  . b complex vitamins tablet Take 1 tablet by mouth daily.   . Cholecalciferol (VITAMIN D) 2000 UNITS tablet Take 2,000 Units by mouth daily.   [name] FLUoxetine (PROZAC) 40 MG capsule Take 1 capsule (40 mg total) by mouth daily.   . pravastatin (PRAVACHOL) 80 MG tablet Take 1 tablet (80 mg total) by  mouth daily.   . metFORMIN (GLUCOPHAGE-XR) 500 MG 24 hr tablet Take 1 tablet (500 mg total) by mouth 3 (three) times daily. Two in the morning and one in the evening 03/11/2018: As per pt, taking 2 tabs daily am   No facility-administered encounter medications on file as of 03/11/2018.    Allergies  Allergen Reactions  . Atorvastatin Other (See Comments)    Muscle cramps  . Oxycodone Other (See Comments)    Does not work- Hydrocodone does work      Review of Systems:  Constitutional: No recent illness  Cardiac: No  chest pain, No  pressure, No palpitations  Respiratory:  No  shortness of  breath. No  Cough  Gastrointestinal: No  abdominal pain, no change on bowel habits  Musculoskeletal: No new myalgia/arthralgia  Skin: No  Rash  Neurologic: No  weakness, No  Dizziness  Psychiatric: No  concerns with depression, No  concerns with anxiety  Exam:  BP (!) 120/55 (BP Location: Left Arm, Patient Position: Sitting, Cuff Size: Normal)   Pulse 83   Temp 98 F (36.7 C) (Oral)   Wt 178 lb 4.8 oz (80.9 kg)   BMI 27.11 kg/m   Constitutional: VS see above. General Appearance: alert, well-developed, well-nourished, NAD  Eyes: Normal lids and conjunctive, non-icteric sclera  Ears, Nose, Mouth, Throat: MMM, Normal external inspection ears/nares/mouth/lips/gums.  Neck: No masses, trachea midline.   Respiratory: Normal respiratory effort. no wheeze, no rhonchi, no rales  Cardiovascular: S1/S2 normal, no murmur, no rub/gallop auscultated. RRR.   Musculoskeletal: Gait normal. Symmetric and independent movement of all extremities  Neurological: Normal balance/coordination. No tremor.  Skin: warm, dry, intact.   Psychiatric: Normal judgment/insight. Normal mood and affect. Oriented x3.   Visit summary with medication list and pertinent instructions was printed for patient to review, advised to alert [place] if any changes needed. All questions at time of visit were answered - patient instructed to contact office with any additional concerns. ER/RTC precautions were reviewed with the patient and understanding verbalized.   Follow-up plan: Return in about 3 months (around 06/11/2018) for A1C recheck, sooner if needed.    Please note: voice recognition software was used to produce this document, and typos may escape review. Please contact Dr. Sheppard Coil for any needed clarifications.

## 2018-03-20 ENCOUNTER — Ambulatory Visit (INDEPENDENT_AMBULATORY_CARE_PROVIDER_SITE_OTHER): Payer: Medicare HMO | Admitting: Osteopathic Medicine

## 2018-03-20 ENCOUNTER — Telehealth: Payer: Self-pay | Admitting: Osteopathic Medicine

## 2018-03-20 DIAGNOSIS — Z23 Encounter for immunization: Secondary | ICD-10-CM | POA: Diagnosis not present

## 2018-03-20 NOTE — Telephone Encounter (Signed)
FYI: Pt stopped by check out. She states med you prescribed is expensive(she bought it), it also makes her extremely thirsty but that's okay.   Thanks!

## 2018-03-20 NOTE — Telephone Encounter (Signed)
If it's overly expensive for her, can try different medicine, would recommend looking at [email or web address] coupon if needed. Let me know if anything I can do!

## 2018-03-21 NOTE — Telephone Encounter (Signed)
Thanks

## 2018-03-21 NOTE — Telephone Encounter (Signed)
Pt has been updated. As per pt, [facility] had medicine for $1400. She paid OOP $250 for a 3 mths supply.

## 2018-03-22 ENCOUNTER — Ambulatory Visit: Payer: Medicare Other | Admitting: Oncology

## 2018-03-25 ENCOUNTER — Ambulatory Visit (HOSPITAL_COMMUNITY)
Admission: RE | Admit: 2018-03-25 | Discharge: 2018-03-25 | Disposition: A | Discharge status: Home / Self Care | Payer: Medicare HMO | Source: Ambulatory Visit | Attending: Oncology | Admitting: Oncology

## 2018-03-25 DIAGNOSIS — R918 Other nonspecific abnormal finding of lung field: Secondary | ICD-10-CM | POA: Diagnosis not present

## 2018-03-25 DIAGNOSIS — I700 Atherosclerosis of aorta: Secondary | ICD-10-CM | POA: Insufficient documentation

## 2018-03-25 DIAGNOSIS — C3431 Malignant neoplasm of lower lobe, right bronchus or lung: Secondary | ICD-10-CM | POA: Diagnosis not present

## 2018-03-25 DIAGNOSIS — K760 Fatty (change of) liver, not elsewhere classified: Secondary | ICD-10-CM | POA: Diagnosis not present

## 2018-03-25 DIAGNOSIS — I2510 Atherosclerotic heart disease of native coronary artery without angina pectoris: Secondary | ICD-10-CM | POA: Diagnosis not present

## 2018-03-29 NOTE — Telephone Encounter (Signed)
OK, will reevaluate at next visit if cost is worth it!

## 2018-04-05 ENCOUNTER — Ambulatory Visit: Payer: Medicare HMO | Admitting: Oncology

## 2018-04-08 ENCOUNTER — Inpatient Hospital Stay: Payer: Medicare HMO | Attending: Oncology | Admitting: Oncology

## 2018-04-08 VITALS — BP 127/82 | HR 74 | Temp 98.0°F | Resp 17 | Ht 68.0 in | Wt 172.3 lb

## 2018-04-08 DIAGNOSIS — Z85038 Personal history of other malignant neoplasm of large intestine: Secondary | ICD-10-CM | POA: Diagnosis not present

## 2018-04-08 DIAGNOSIS — Z9221 Personal history of antineoplastic chemotherapy: Secondary | ICD-10-CM | POA: Insufficient documentation

## 2018-04-08 DIAGNOSIS — Z85118 Personal history of other malignant neoplasm of bronchus and lung: Secondary | ICD-10-CM | POA: Diagnosis not present

## 2018-04-08 DIAGNOSIS — C3431 Malignant neoplasm of lower lobe, right bronchus or lung: Secondary | ICD-10-CM

## 2018-04-08 NOTE — Progress Notes (Signed)
  [facility] OFFICE PROGRESS NOTE   Diagnosis: Colon cancer, non-small cell lung cancer  INTERVAL HISTORY:   Kelli [name] returns for a scheduled visit.  No dyspnea.  Good appetite.  She feels well.  Objective:  Vital signs in last 24 hours:  Blood pressure 127/82, pulse 74, temperature 98 F (36.7 C), temperature source Oral, resp. rate 17, height '5\' 8"'$  (1.727 m), weight 172 lb 4.8 oz (78.2 kg), SpO2 99 %.    HEENT: Neck without mass Lymphatics: No cervical, supraclavicular, axillary, or inguinal nodes Resp: Lungs clear bilaterally Cardio: Regular rate and rhythm GI: No hepatosplenomegaly, no mass, nontender Vascular: No leg edema  Lab Results:  Lab Results  Component Value Date   WBC 8.9 11/29/2017   HGB 12.4 11/29/2017   HCT 36.6 11/29/2017   MCV 91.0 11/29/2017   PLT 340 11/29/2017   NEUTROABS 5,687 11/29/2017    CMP  Lab Results  Component Value Date   NA 138 11/29/2017   K 4.3 11/29/2017   CL 103 11/29/2017   CO2 27 11/29/2017   GLUCOSE 219 (H) 11/29/2017   BUN 14 11/29/2017   CREATININE 0.73 11/29/2017   CALCIUM 9.5 11/29/2017   PROT 6.6 11/29/2017   ALBUMIN 4.0 10/31/2016   AST 15 11/29/2017   ALT 17 11/29/2017   ALKPHOS 90 10/31/2016   BILITOT 0.3 11/29/2017   GFRNONAA 84 11/29/2017   GFRAA 97 11/29/2017      Medications: I have reviewed the patient's current medications.   Assessment/Plan: 1. 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 History of colonic polyps-status post a surveillance colonoscopy in February 2016 with a tubular adenoma removed 3. History of heavy tobacco use, quit in 2010 4. History of lung nodules-last imaging was an abdomen CT in October 2013 prior to repeat imaging 09/03/2014  CT 09/03/2014 revealed enlargement of a spiculated right lower lobe nodule  PET scan 09/16/2014 revealed low-level FDG activity  associated with the right lower lobe spiculated nodule  Status post a right lower lobe basilar segmentectomy and mediastinal lymph node dissection on 10/06/2014 with the pathology confirming a minimally invasive well-differentiated adenocarcinoma,pT1a,N0, invasive component measured less than 0.3 cm, with negative surgical margins  CT chest 09/15/2015 with no evidence of recurrent lung cancer, stable nodules except for a probable new 4 mm right lower lobe subpleural nodule  CT chest 06/19/2016-no evidence of recurrent lung cancer, stable lung nodules  CT chest 03/20/2017-stable lung nodules  CT chest 03/26/2018- stable lung nodules  5. Post thoracotomy pain-resolved    Disposition: Kelli [name] remains in clinical remission from colon cancer and lung cancer.  She will return for an office visit and surveillance chest CT in 1 year.  She will continue colonoscopy surveillance with Dr. Ardis Hughs.  She will follow-up with Dr. Sheppard Coil for evaluation of the atherosclerotic coronary disease noted on the chest CTs.  15 minutes were spent with the patient today.  The majority of the time was used for counseling and coordination of care.  Betsy Coder, MD  04/08/2018  4:15 PM

## 2018-04-09 ENCOUNTER — Telehealth: Payer: Self-pay | Admitting: Oncology

## 2018-04-09 NOTE — Telephone Encounter (Signed)
Verified appointment with patient.

## 2018-05-01 ENCOUNTER — Ambulatory Visit (INDEPENDENT_AMBULATORY_CARE_PROVIDER_SITE_OTHER): Payer: Medicare HMO | Admitting: Osteopathic Medicine

## 2018-05-01 ENCOUNTER — Encounter: Payer: Self-pay | Admitting: Osteopathic Medicine

## 2018-05-01 VITALS — BP 125/55 | HR 75 | Temp 97.8°F | Wt 170.6 lb

## 2018-05-01 DIAGNOSIS — E119 Type 2 diabetes mellitus without complications: Secondary | ICD-10-CM | POA: Diagnosis not present

## 2018-05-01 NOTE — Progress Notes (Signed)
HPI: Kelli [name] is a 72 y.o. female who  has a past medical history of Anxiety, Colon cancer ([place]), Depression, Depression with anxiety (09/19/2011), H/O colon cancer, stage II (09/19/2011), High cholesterol, Hyperlipidemia (09/19/2011), Lung nodules (09/19/2011), and Urinary urgency.  she presents to [facility] today, 05/01/18,  for chief complaint of:  Diabetes follow-up, eval medication change 03/2018    08/29/17: 7.6%increased Metformin to 500 mg tid (2 in am ok) but this caused diarrhea issues so went back to twice per day   11/29/17: 7.2%has been bad about diet lately, could exercise more as well. Overall doing well though.   Last visit 03/11/18: 8.9% was eating high protein and limiting carbs but noted some weight gain so she's back to just general lower-carb diet. We tried adding jardiance, this was expensive but she did get it  Doing well on the medicine Hasn't changed diet "so we will know if the meds are working" haha   Nurse and I mis-communicated - I asked for POC Glc, we got A1C which was 7.9, will not charge patient for our error but good to know sugars are improving     At today's visit... Past medical history, surgical history, and family history reviewed and updated as needed.  Current medication list and allergy/intolerance information reviewed and updated as needed. (See remainder of HPI, ROS, Phys Exam below)           ASSESSMENT/PLAN: The encounter diagnosis was Type 2 diabetes mellitus without complication, without long-term current use of insulin ([place]).   Patient Instructions  Call your insurance and ask what is on their formulary for diabetes medicines - they can mail it to you and/or fax it to me [phone]  Watch the sweets!    Follow-up plan: Return for recheck A1C around  06/12/2018.                             ############################################ ############################################ ############################################ ############################################    No outpatient medications have been marked as taking for the 05/01/18 encounter (Appointment) with Emeterio Reeve, DO.    Allergies  Allergen Reactions  . Atorvastatin Other (See Comments)    Muscle cramps  . Oxycodone Other (See Comments)    Does not work- Hydrocodone does work       Review of Systems:  Constitutional: No recent illness  Cardiac: No  chest pain,  Respiratory:  No  shortness of breath. No  Cough  Gastrointestinal: No  abdominal pain, no change on bowel habits  Musculoskeletal: No new myalgia/arthralgia  Neurologic: No  weakness, No  Dizziness   Exam:  BP (!) 125/55 (BP Location: Left Arm, Patient Position: Sitting, Cuff Size: Normal)   Pulse 75   Temp 97.8 F (36.6 C) (Oral)   Wt 170 lb 9.6 oz (77.4 kg)   BMI 25.94 kg/m   Constitutional: VS see above. General Appearance: alert, well-developed, well-nourished, NAD  Eyes: Normal lids and conjunctive, non-icteric sclera  Ears, Nose, Mouth, Throat: MMM, Normal external inspection ears/nares/mouth/lips/gums.  Neck: No masses, trachea midline.   Respiratory: Normal respiratory effort.   Neurological: Normal balance/coordination. No tremor.  Skin: warm, dry, intact.   Psychiatric: Normal judgment/insight. Normal mood and affect. Oriented x3.       Visit summary with medication list and pertinent instructions was printed for patient to review, patient was advised to alert [place] if any updates are needed. All questions at time of visit were  answered - patient instructed to contact office with any additional concerns. ER/RTC precautions were reviewed with the patient and understanding verbalized.   Note: Total time spent 10 minutes, greater than  50% of the visit was spent face-to-face counseling and coordinating care for the following: The encounter diagnosis was Type 2 diabetes mellitus without complication, without long-term current use of insulin ([place])..  Please note: voice recognition software was used to produce this document, and typos may escape review. Please contact Dr. Sheppard Coil for any needed clarifications.    Follow up plan: No follow-ups on file.

## 2018-05-01 NOTE — Patient Instructions (Signed)
Call your insurance and ask what is on their formulary for diabetes medicines - they can mail it to you and/or fax it to me [phone]

## 2018-05-07 DIAGNOSIS — H5213 Myopia, bilateral: Secondary | ICD-10-CM | POA: Diagnosis not present

## 2018-05-07 DIAGNOSIS — Z7984 Long term (current) use of oral hypoglycemic drugs: Secondary | ICD-10-CM | POA: Diagnosis not present

## 2018-05-07 DIAGNOSIS — E119 Type 2 diabetes mellitus without complications: Secondary | ICD-10-CM | POA: Diagnosis not present

## 2018-05-07 DIAGNOSIS — H524 Presbyopia: Secondary | ICD-10-CM | POA: Diagnosis not present

## 2018-05-07 DIAGNOSIS — H2513 Age-related nuclear cataract, bilateral: Secondary | ICD-10-CM | POA: Diagnosis not present

## 2018-06-12 ENCOUNTER — Ambulatory Visit (INDEPENDENT_AMBULATORY_CARE_PROVIDER_SITE_OTHER): Payer: Medicare HMO | Admitting: Osteopathic Medicine

## 2018-06-12 ENCOUNTER — Encounter: Payer: Self-pay | Admitting: Osteopathic Medicine

## 2018-06-12 VITALS — BP 129/54 | HR 74 | Temp 97.5°F | Wt 171.0 lb

## 2018-06-12 DIAGNOSIS — E119 Type 2 diabetes mellitus without complications: Secondary | ICD-10-CM

## 2018-06-12 LAB — POCT GLYCOSYLATED HEMOGLOBIN (HGB A1C): HEMOGLOBIN A1C: 7 % — AB (ref 4.0–5.6)

## 2018-06-12 MED ORDER — GLIPIZIDE ER 10 MG PO TB24: 10.0000 mg | ORAL_TABLET | Freq: Every day | ORAL | 1 refills | Status: DC

## 2018-06-12 MED ORDER — METFORMIN HCL ER 500 MG PO TB24
1000.0000 mg | ORAL_TABLET | Freq: Every day | ORAL | 3 refills | Status: DC
Start: 2018-06-12 — End: 2019-03-07

## 2018-06-12 NOTE — Patient Instructions (Signed)
Will trial Glipizide once daily, STOP Jardiance. If A1C worse, would consider gong back to the [place].

## 2018-06-12 NOTE — Progress Notes (Signed)
HPI: Kelli [name] is a 73 y.o. female who  has a past medical history of Anxiety, Colon cancer ([place]), Depression, Depression with anxiety (09/19/2011), H/O colon cancer, stage II (09/19/2011), High cholesterol, Hyperlipidemia (09/19/2011), Lung nodules (09/19/2011), and Urinary urgency.  she presents to [facility] today, 06/12/18,  for chief complaint of:  Diabetes follow-up, eval medication change 03/2018    08/29/17: 7.6%increased Metformin to 500 mg tid (2 in am ok) but this caused diarrhea issues so went back to twice per day   11/29/17: 7.2%has been bad about diet lately, could exercise more as well. Overall doing well though.   03/11/18: 8.9% was eating high protein and limiting carbs but noted some weight gain so she's back to just general lower-carb diet. We tried adding jardiance, this was expensive but she did get it  Today, 06/12/18: 7.0% progress! Would like to try something cheaper than the jardiance      At today's visit... Past medical history, surgical history, and family history reviewed and updated as needed.  Current medication list and allergy/intolerance information reviewed and updated as needed. (See remainder of HPI, ROS, Phys Exam below)           ASSESSMENT/PLAN: The encounter diagnosis was Type 2 diabetes mellitus without complication, without long-term current use of insulin ([place]).  Meds ordered this encounter  Medications  . metFORMIN (GLUCOPHAGE-XR) 500 MG 24 hr tablet    Sig: Take 2 tablets (1,000 mg total) by mouth daily with breakfast.    Dispense:  180 tablet    Refill:  3    Cancel the previous 180 / bid dose thanks  . glipiZIDE (GLUCOTROL XL) 10 MG 24 hr tablet    Sig: Take 1 tablet (10 mg total) by mouth daily with breakfast.    Dispense:  90 tablet    Refill:  1     Patient Instructions  Will trial Glipizide once daily, STOP Jardiance. If A1C worse, would consider gong back to the [employer].         Follow-up plan: Return in about 3 months (around 09/11/2018) for A1C Diabetes follow-up .                             ############################################ ############################################ ############################################ ############################################    Current Meds  Medication Sig  . aspirin 81 MG tablet Take 81 mg by mouth daily.  [name] b complex vitamins tablet Take 1 tablet by mouth daily.  . Cholecalciferol (VITAMIN D) 2000 UNITS tablet Take 2,000 Units by mouth daily.  . empagliflozin (JARDIANCE) 25 MG TABS tablet Take 25 mg by mouth daily.  [name] FLUoxetine (PROZAC) 40 MG capsule Take 1 capsule (40 mg total) by mouth daily.  . pravastatin (PRAVACHOL) 80 MG tablet Take 1 tablet (80 mg total) by mouth daily.    Allergies  Allergen Reactions  . Atorvastatin Other (See Comments)    Muscle cramps  . Oxycodone Other (See Comments)    Does not work- Hydrocodone does work       Review of Systems:  Constitutional: No recent illness  Cardiac: No  chest pain  Respiratory:  No  shortness of breath. No  Cough  Gastrointestinal: No  abdominal pain, no change on bowel habits  Musculoskeletal: No new myalgia/arthralgia  Neurologic: No  weakness, No  Dizziness   Exam:  BP (!) 129/54 (BP Location: Left Arm, Patient Position: Sitting, Cuff Size: Normal)   Pulse  74   Temp (!) 97.5 F (36.4 C) (Oral)   Wt 171 lb (77.6 kg)   BMI 26.00 kg/m   Constitutional: VS see above. General Appearance: alert, well-developed, well-nourished, NAD  Eyes: Normal lids and conjunctive, non-icteric sclera  Ears, Nose, Mouth, Throat: MMM, Normal external inspection ears/nares/mouth/lips/gums.  Neck: No masses, trachea midline.   Respiratory: Normal respiratory effort.   Neurological: Normal balance/coordination. No tremor.  Skin: warm, dry, intact.   Psychiatric: Normal judgment/insight. Normal mood and  affect. Oriented x3.       Visit summary with medication list and pertinent instructions was printed for patient to review, patient was advised to alert [place] if any updates are needed. All questions at time of visit were answered - patient instructed to contact office with any additional concerns. ER/RTC precautions were reviewed with the patient and understanding verbalized.   Note: Total time spent 25 minutes, greater than 50% of the visit was spent face-to-face counseling and coordinating care for the following: The encounter diagnosis was Type 2 diabetes mellitus without complication, without long-term current use of insulin ([place])..  Please note: voice recognition software was used to produce this document, and typos may escape review. Please contact Dr. Sheppard Coil for any needed clarifications.    Follow up plan: Return in about 3 months (around 09/11/2018) for A1C Diabetes follow-up .

## 2018-07-10 ENCOUNTER — Encounter: Payer: Self-pay | Admitting: Family Medicine

## 2018-07-10 ENCOUNTER — Ambulatory Visit (INDEPENDENT_AMBULATORY_CARE_PROVIDER_SITE_OTHER): Payer: Medicare HMO | Admitting: Family Medicine

## 2018-07-10 ENCOUNTER — Telehealth: Payer: Self-pay | Admitting: Osteopathic Medicine

## 2018-07-10 VITALS — BP 94/74 | HR 76 | Wt 177.0 lb

## 2018-07-10 DIAGNOSIS — E119 Type 2 diabetes mellitus without complications: Secondary | ICD-10-CM | POA: Diagnosis not present

## 2018-07-10 DIAGNOSIS — E785 Hyperlipidemia, unspecified: Secondary | ICD-10-CM | POA: Diagnosis not present

## 2018-07-10 DIAGNOSIS — R69 Illness, unspecified: Secondary | ICD-10-CM | POA: Diagnosis not present

## 2018-07-10 DIAGNOSIS — M25511 Pain in right shoulder: Secondary | ICD-10-CM

## 2018-07-10 DIAGNOSIS — M25519 Pain in unspecified shoulder: Secondary | ICD-10-CM | POA: Diagnosis not present

## 2018-07-10 DIAGNOSIS — I252 Old myocardial infarction: Secondary | ICD-10-CM | POA: Diagnosis not present

## 2018-07-10 DIAGNOSIS — R269 Unspecified abnormalities of gait and mobility: Secondary | ICD-10-CM | POA: Diagnosis not present

## 2018-07-10 DIAGNOSIS — K08109 Complete loss of teeth, unspecified cause, unspecified class: Secondary | ICD-10-CM | POA: Diagnosis not present

## 2018-07-10 DIAGNOSIS — F419 Anxiety disorder, unspecified: Secondary | ICD-10-CM | POA: Diagnosis not present

## 2018-07-10 DIAGNOSIS — G8929 Other chronic pain: Secondary | ICD-10-CM | POA: Diagnosis not present

## 2018-07-10 DIAGNOSIS — I499 Cardiac arrhythmia, unspecified: Secondary | ICD-10-CM | POA: Diagnosis not present

## 2018-07-10 MED ORDER — ZOSTER VAC RECOMB ADJUVANTED 50 MCG/0.5ML IM SUSR: 0.5000 mL | Freq: Once | INTRAMUSCULAR | 1 refills | Status: AC

## 2018-07-10 MED ORDER — AMBULATORY NON FORMULARY MEDICATION: 0 refills | Status: DC

## 2018-07-10 MED ORDER — GLIPIZIDE ER 5 MG PO TB24: 5.0000 mg | ORAL_TABLET | Freq: Every day | ORAL | 0 refills | Status: DC

## 2018-07-10 NOTE — Progress Notes (Signed)
Kelli [name] is a 73 y.o. female who presents to [facility] today for right shoulder pain, and hypoglycemia.  Anel has a history of right shoulder pain thought to be due to rotator cuff tendinitis.  She has had steroid injections in the past most recently in April.  She notes this worked until recently with the pain returning in the last several days.  She notes pain in the lateral upper arm worse with overhead motion and reaching back.  Pain is consistent with previous episodes of rotator cuff tendinitis.  She is continued home exercise program and would like to proceed with repeat injection if possible.  Additionally she notes she is been having episodes of jitteriness in the afternoons.  About a month ago her diabetes medications were changed by her primary care provider.  She previously was well controlled on Jardiance however it was expensive.  She was started on glipizide extended release 10 mg daily.  She does not check her blood sugar.  She notes that she feels better if she eats something with carbohydrates after feeling jittery.   ROS as above:  Exam:  BP 94/74   Pulse 76   Wt 177 lb (80.3 kg)   BMI 26.91 kg/m  Wt Readings from Last 5 Encounters:  07/10/18 177 lb (80.3 kg)  06/12/18 171 lb (77.6 kg)  05/01/18 170 lb 9.6 oz (77.4 kg)  04/08/18 172 lb 4.8 oz (78.2 kg)  03/11/18 178 lb 4.8 oz (80.9 kg)    Gen: Well NAD HEENT: EOMI,  MMM Lungs: Normal work of breathing. CTABL Heart: RRR no MRG Abd: NABS, Soft. Nondistended, Nontender Exts: Brisk capillary refill, warm and well perfused.  MSK: Right shoulder normal-appearing normal range of motion pain with abduction and internal rotation.  Positive Hawkins test.  Positive Neer's test.  Intact strength.    Lab and Radiology Results Procedure: Real-time Ultrasound Guided Injection of right subacromial  bursa Device: GE Logiq E   Images permanently stored and available for review in the ultrasound unit. Verbal informed consent obtained.  Discussed risks and benefits of procedure. Warned about infection bleeding damage to structures skin hypopigmentation and fat atrophy among others. Patient expresses understanding and agreement Time-out conducted.   Noted no overlying erythema, induration, or other signs of local infection.   Skin prepped in a sterile fashion.   Local anesthesia: Topical Ethyl chloride.   With sterile technique and under real time ultrasound guidance:  40 mg of Kenalog and 3 mL of Marcaine injected easily.   Completed without difficulty   Pain immediately resolved suggesting accurate placement of the medication.   Advised to call if fevers/chills, erythema, induration, drainage, or persistent bleeding.   Images permanently stored and available for review in the ultrasound unit.  Impression: Technically successful ultrasound guided injection.   Lab Results  Component Value Date   HGBA1C 7.0 (A) 06/12/2018         Assessment and Plan: 73 y.o. female with  Right shoulder pain: Subacromial bursitis.  Continue home exercise program and subacromial injection as above.  Recheck as needed.  Jitteriness in the afternoons.  Very likely hypoglycemia with new glipizide.  Plan to decrease glipizide from 10 mg daily to 5 mg daily.  Additionally provide glucometer so patient can check blood sugar and know what her blood sugar is.  Additionally consider going back to Jardiance with PCP in the near future.  Patient assistance program for  Jardiance provided.   Meds ordered this encounter  Medications  . glipiZIDE (GLUCOTROL XL) 5 MG 24 hr tablet    Sig: Take 1 tablet (5 mg total) by mouth daily with breakfast.    Dispense:  90 tablet    Refill:  0    Replaces 10mg  dose  . AMBULATORY NON FORMULARY MEDICATION    Sig: Single glucometer with lancets, test strips. Test daily.    Disp qs 3 months E11.9    Dispense:  1 each    Refill:  0     Historical information moved to improve visibility of documentation.  Past Medical History:  Diagnosis Date  . Anxiety   . Colon cancer ([place])    colon ca dx 02/2009  . Depression   . Depression with anxiety 09/19/2011  . H/O colon cancer, stage II 09/19/2011   T3N0  Cecum Sept 2010  . High cholesterol   . Hyperlipidemia 09/19/2011  . Lung nodules 09/19/2011   granulomas  . Urinary urgency    Past Surgical History:  Procedure Laterality Date  . APPENDECTOMY    . BREAST SURGERY  1978   augmentation  . COLON SURGERY     2010  . CRYO INTERCOSTAL NERVE BLOCK N/A 10/05/2014   Procedure: CRYO INTERCOSTAL NERVE BLOCK;  Surgeon: [name], MD;  Location: [facility];  Service: Thoracic;  Laterality: N/A;  . LYMPH NODE DISSECTION Right 10/05/2014   Procedure: LYMPH NODE DISSECTION;  Surgeon: [name], MD;  Location: [place];  Service: Thoracic;  Laterality: Right;  . PARTIAL COLECTOMY Right 2010  . SEGMENTECOMY Right 10/05/2014   Procedure: right lower lobe SEGMENTECTOMY;  Surgeon: [name], MD;  Location: [place];  Service: Thoracic;  Laterality: Right;  [name] VIDEO ASSISTED THORACOSCOPY (VATS)/WEDGE RESECTION Right 10/05/2014   Procedure: Right VIDEO ASSISTED THORACOSCOPY (VATS) with right lower lobe lung nodule WEDGE RESECTION;  Surgeon: [name], MD;  Location: [place];  Service: Thoracic;  Laterality: Right;   Social History   Tobacco Use  . Smoking status: Former Smoker    Packs/day: 3.00    Years: 40.00    Pack years: 120.00    Types: Cigarettes    Last attempt to quit: 02/06/2009    Years since quitting: 9.4  . Smokeless tobacco: Never Used  Substance Use Topics  . Alcohol use: Yes    Alcohol/week: 0.0 standard drinks    Comment: 3-4 times a week 1-2 beers   family history includes Heart disease in her brother and mother.  Medications: Current Outpatient Medications  Medication  Sig Dispense Refill  . aspirin 81 MG tablet Take 81 mg by mouth daily.    [name] b complex vitamins tablet Take 1 tablet by mouth daily.    . Cholecalciferol (VITAMIN D) 2000 UNITS tablet Take 2,000 Units by mouth daily.    [name] FLUoxetine (PROZAC) 40 MG capsule Take 1 capsule (40 mg total) by mouth daily. 90 capsule 3  . metFORMIN (GLUCOPHAGE-XR) 500 MG 24 hr tablet Take 2 tablets (1,000 mg total) by mouth daily with breakfast. 180 tablet 3  . pravastatin (PRAVACHOL) 80 MG tablet Take 1 tablet (80 mg total) by mouth daily. 90 tablet 3  . AMBULATORY NON FORMULARY MEDICATION Single glucometer with lancets, test strips. Test daily.  Disp qs 3 months E11.9 1 each 0  . glipiZIDE (GLUCOTROL XL) 5 MG 24 hr tablet Take 1 tablet (5 mg total) by mouth daily with breakfast. 90 tablet 0  .  Zoster Vaccine Adjuvanted [facility]) injection Inject 0.5 mLs into the muscle once for 1 dose. Repeat in 2 to 6 months. Fax immunization record to [phone] 0.5 mL 1   No current facility-administered medications for this visit.    Allergies  Allergen Reactions  . Atorvastatin Other (See Comments)    Muscle cramps  . Oxycodone Other (See Comments)    Does not work- Hydrocodone does work     Discussed warning signs or symptoms. Please see discharge instructions. Patient expresses understanding.

## 2018-07-10 NOTE — Patient Instructions (Addendum)
Thank you for coming in today.  Call or go to the ER if you develop a large red swollen joint with extreme pain or oozing puss.   Continue home exercises for the shoulder.   Recheck with me as needed for shoulder.   For diabetes STOP glipizide 10 daily.   Start Glipizide 51m daily.   We may consider going back to [facility] call about patient assistance program   [employer] Patient Assistance Program (includes a number of medicines) [phone] Check sugars if you feel bad.   Follow up with Dr ASheppard Coilin April unless not doing well.    Hypoglycemia Hypoglycemia occurs when the level of sugar (glucose) in the blood is too low. Hypoglycemia can happen in people who do or do not have diabetes. It can develop quickly, and it can be a medical emergency. For most people with diabetes, a blood glucose level below 70 mg/dL (3.9 mmol/L) is considered hypoglycemia. Glucose is a type of sugar that provides the body's main source of energy. Certain hormones (insulin and glucagon) control the level of glucose in the blood. Insulin lowers blood glucose, and glucagon raises blood glucose. Hypoglycemia can result from having too much insulin in the bloodstream, or from not eating enough food that contains glucose. You may also have reactive hypoglycemia, which happens within 4 hours after eating a meal. What are the causes? Hypoglycemia occurs most often in people who have diabetes and may be caused by:  Diabetes medicine.  Not eating enough, or not eating often enough.  Increased physical activity.  Drinking alcohol on an empty stomach. If you do not have diabetes, hypoglycemia may be caused by:  A tumor in the pancreas.  Not eating enough, or not eating for long periods at a time (fasting).  A severe infection or illness.  Certain medicines. What increases the risk? Hypoglycemia is more likely to develop in:  People who have diabetes and take medicines to lower blood  glucose.  People who abuse alcohol.  People who have a severe illness. What are the signs or symptoms? Mild symptoms Mild hypoglycemia may not cause any symptoms. If you do have symptoms, they may include:  Hunger.  Anxiety.  Sweating and feeling clammy.  Dizziness or feeling light-headed.  Sleepiness.  Nausea.  Increased heart rate.  Headache.  Blurry vision.  Irritability.  Tingling or numbness around the mouth, lips, or tongue.  A change in coordination.  Restless sleep. Moderate symptoms Moderate hypoglycemia can cause:  Mental confusion and poor judgment.  Behavior changes.  Weakness.  Irregular heartbeat. Severe symptoms Severe hypoglycemia is a medical emergency. It can cause:  Fainting.  Seizures.  Loss of consciousness (coma).  Death. How is this diagnosed? Hypoglycemia is diagnosed with a blood test to measure your blood glucose level. This blood test is done while you are having symptoms. Your health care provider may also do a physical exam and review your medical history. How is this treated? This condition can often be treated by immediately eating or drinking something that contains sugar, such as:  Fruit juice, 4-6 oz (120-150 mL).  Regular soda (not diet soda), 4-6 oz (120-150 mL).  Low-fat milk, 4 oz (120 mL).  Several pieces of hard candy.  Sugar or honey, 1 Tbsp (15 mL). Treating hypoglycemia if you have diabetes If you are alert and able to swallow safely, follow the 15:15 rule:  Take 15 grams of a rapid-acting carbohydrate. Talk with your health care provider about  how much you should take.  Rapid-acting options include: ? Glucose pills (take 15 grams). ? 6-8 pieces of hard candy. ? 4-6 oz (120-150 mL) of fruit juice. ? 4-6 oz (120-150 mL) of regular (not diet) soda. ? 1 Tbsp (15 mL) honey or sugar.  Check your blood glucose 15 minutes after you take the carbohydrate.  If the repeat blood glucose level is still at  or below 70 mg/dL (3.9 mmol/L), take 15 grams of a carbohydrate again.  If your blood glucose level does not increase above 70 mg/dL (3.9 mmol/L) after 3 tries, seek emergency medical care.  After your blood glucose level returns to normal, eat a meal or a snack within 1 hour.  Treating severe hypoglycemia Severe hypoglycemia is when your blood glucose level is at or below 54 mg/dL (3 mmol/L). Severe hypoglycemia is a medical emergency. Get medical help right away. If you have severe hypoglycemia and you cannot eat or drink, you may need an injection of glucagon. A family member or close friend should learn how to check your blood glucose and how to give you a glucagon injection. Ask your health care provider if you need to have an emergency glucagon injection kit available. Severe hypoglycemia may need to be treated in a hospital. The treatment may include getting glucose through an IV. You may also need treatment for the cause of your hypoglycemia. Follow these instructions at home:  General instructions  Take over-the-counter and prescription medicines only as told by your health care provider.  Monitor your blood glucose as told by your health care provider.  Limit alcohol intake to no more than 1 drink a day for nonpregnant women and 2 drinks a day for men. One drink equals 12 oz of beer (355 mL), 5 oz of wine (148 mL), or 1 oz of hard liquor (44 mL).  Keep all follow-up visits as told by your health care provider. This is important. If you have diabetes:  Always have a rapid-acting carbohydrate snack with you to treat low blood glucose.  Follow your diabetes management plan as directed. Make sure you: ? Know the symptoms of hypoglycemia. It is important to treat it right away to prevent it from becoming severe. ? Take your medicines as directed. ? Follow your exercise plan. ? Follow your meal plan. Eat on time, and do not skip meals. ? Check your blood glucose as often as  directed. Always check before and after exercise. ? Follow your sick day plan whenever you cannot eat or drink normally. Make this plan in advance with your health care provider.  Share your diabetes management plan with people in your workplace, school, and household.  Check your urine for ketones when you are ill and as told by your health care provider.  Carry a medical alert card or wear medical alert jewelry. Contact a health care provider if:  You have problems keeping your blood glucose in your target range.  You have frequent episodes of hypoglycemia. Get help right away if:  You continue to have hypoglycemia symptoms after eating or drinking something containing glucose.  Your blood glucose is at or below 54 mg/dL (3 mmol/L).  You have a seizure.  You faint. These symptoms may represent a serious problem that is an emergency. Do not wait to see if the symptoms will go away. Get medical help right away. Call your local emergency services (911 in the [place] Summary  Hypoglycemia occurs when the level of sugar (glucose) in the  blood is too low.  Hypoglycemia can happen in people who do or do not have diabetes. It can develop quickly, and it can be a medical emergency.  Make sure you know the symptoms of hypoglycemia and how to treat it.  Always have a rapid-acting carbohydrate snack with you to treat low blood sugar. This information is not intended to replace advice given to you by your health care provider. Make sure you discuss any questions you have with your health care provider. Document Released: 05/22/2005 Document Revised: 11/13/2017 Document Reviewed: 06/25/2015 Elsevier Interactive Patient Education  2019 [employer].

## 2018-07-10 NOTE — Telephone Encounter (Signed)
shingrix vaccine need

## 2018-08-09 ENCOUNTER — Telehealth: Payer: Self-pay

## 2018-08-09 NOTE — Telephone Encounter (Signed)
To clarify, is she having these shaking episodes even on the lower dose of glipizide?  I would have her check her sugars when she is having these symptoms.  Please confirm that she has a glucometer and testing supplies at home, let me know if she needs these sent to pharmacy.  If sugars are low, she should eat something immediately or drink a small glass of juice.  If sugars are high, it really depends how severe the elevation is, I do have her keep track of them over the next week or 2 and write these down, can come see me and we can review these.

## 2018-08-09 NOTE — Telephone Encounter (Signed)
I think she can stop the low-dose. With A1c above goal, she either needs to be really diligent about carbohydrate avoidance and increasing exercise, or we will need to start a different medication, which I know her insurance has not been great about covering.  If she wants me to send alternative, let me know.   Usually for medication changes or side effects, we ask patients come into the office to discuss such things, that way there is no confusion with the phone tag and notes back and forth.  We are doing her favor by trying to get this settled over the phone.  If she has any additional concerns, let's have her schedule a visit

## 2018-08-09 NOTE — Telephone Encounter (Signed)
Pt advised and scheduled for OV next week to discuss Rx changes. She does report at last OV she brought in a "book of everything insurance covers" and left it with Provider. She is going to reference that list at [place]. She does not have another copy.

## 2018-08-09 NOTE — Telephone Encounter (Addendum)
Pt called stating that she is having "shaking" after taking glipizide rx. As per pt, she saw Dr. [name] and medication dose was cut in half to 5 mg. Pt wants to know what should she do. She's also asking for recommendations on what can she do about her sugars levels being high or low. Pls advise.

## 2018-08-09 NOTE — Telephone Encounter (Signed)
From the previous conversation - pt stated she was shaking even on the low dose. I did ask patient if she does bs checks at home and she stated no and was never told to do this. Pls note, pt became slightly irritated at original call. I informed her I was just trying to get as much info to properly send a note to provider regarding pt's concern. Pls advise, thanks.

## 2018-08-13 ENCOUNTER — Ambulatory Visit (INDEPENDENT_AMBULATORY_CARE_PROVIDER_SITE_OTHER): Payer: Medicare HMO | Admitting: Osteopathic Medicine

## 2018-08-13 ENCOUNTER — Encounter: Payer: Self-pay | Admitting: Osteopathic Medicine

## 2018-08-13 VITALS — BP 138/50 | HR 75 | Temp 98.1°F | Wt 179.7 lb

## 2018-08-13 DIAGNOSIS — R251 Tremor, unspecified: Secondary | ICD-10-CM

## 2018-08-13 DIAGNOSIS — R5383 Other fatigue: Secondary | ICD-10-CM

## 2018-08-13 NOTE — Patient Instructions (Addendum)
Will hold glipizide If still tremor after 2 weeks, let me know! Will be due for A1C 09/11/2018 - come see me

## 2018-08-13 NOTE — Progress Notes (Signed)
HPI: Kelli [name] is a 73 y.o. female who  has a past medical history of Anxiety, Colon cancer ([place]), Depression, Depression with anxiety (09/19/2011), H/O colon cancer, stage II (09/19/2011), High cholesterol, Hyperlipidemia (09/19/2011), Lung nodules (09/19/2011), and Urinary urgency.  she presents to [facility] today, 08/13/18,  for chief complaint of: Concern for tremors, as potential side effect of diabetes medicine.  Noticing hand tremors, typically in early afternoon, mild, rest/intention, no pill-rolling, no gait disturbance, no confusion or other mental status change.  She is concerned about this as well as occasional waves of fatigue and hot flashes, when she checks her sugar during these episodes it is normal.  Glipizide was recently decreased to half dose when she was here to see Dr. [name] for shoulder issue about a month ago, she was on glipizide 10 mg daily, we decreased down to 5 but patient is still noticing these symptoms.  To Dr. [name], she reported that she was having some jitteriness, improving with carbohydrate intake.    Past medical, surgical, social and family history reviewed:  Patient Active Problem List   Diagnosis Date Noted  . Squamous cell carcinoma 03/05/2017  . Echocardiogram shows left ventricular diastolic dysfunction 72/53/6644  . Heart palpitations 07/25/2016  . Need for vaccination for pneumococcus 07/13/2016  . Osteoarthritis of hand, right 08/06/2015  . Diabetes mellitus type 2, uncomplicated ([place]) 03/47/4259  . Lung cancer, lower lobe ([place]) 10/09/2014  . Hematochezia 04/01/2013  . H/O colon cancer, stage II 09/19/2011  . Depression with anxiety 09/19/2011  . Hyperlipidemia 09/19/2011    Past Surgical History:  Procedure Laterality Date  . APPENDECTOMY    . BREAST SURGERY  1978   augmentation  . COLON SURGERY     2010  . CRYO INTERCOSTAL NERVE BLOCK N/A 10/05/2014   Procedure: CRYO INTERCOSTAL NERVE BLOCK;   Surgeon: [name], MD;  Location: [facility];  Service: Thoracic;  Laterality: N/A;  . LYMPH NODE DISSECTION Right 10/05/2014   Procedure: LYMPH NODE DISSECTION;  Surgeon: [name], MD;  Location: [facility];  Service: Thoracic;  Laterality: Right;  . PARTIAL COLECTOMY Right 2010  . SEGMENTECOMY Right 10/05/2014   Procedure: right lower lobe SEGMENTECTOMY;  Surgeon: [name], MD;  Location: [facility];  Service: Thoracic;  Laterality: Right;  [name] VIDEO ASSISTED THORACOSCOPY (VATS)/WEDGE RESECTION Right 10/05/2014   Procedure: Right VIDEO ASSISTED THORACOSCOPY (VATS) with right lower lobe lung nodule WEDGE RESECTION;  Surgeon: [name], MD;  Location: [facility];  Service: Thoracic;  Laterality: Right;    Social History   Tobacco Use  . Smoking status: Former Smoker    Packs/day: 3.00    Years: 40.00    Pack years: 120.00    Types: Cigarettes    Last attempt to quit: 02/06/2009    Years since quitting: 9.5  . Smokeless tobacco: Never Used  Substance Use Topics  . Alcohol use: Yes    Alcohol/week: 0.0 standard drinks    Comment: 3-4 times a week 1-2 beers    Family History  Problem Relation Age of Onset  . Heart disease Mother   . Heart disease Brother      Current medication list and allergy/intolerance information reviewed:    Current Outpatient Medications  Medication Sig Dispense Refill  . AMBULATORY NON FORMULARY MEDICATION Single glucometer with lancets, test strips. Test daily.  Disp qs 3 months E11.9 1 each 0  . aspirin 81 MG tablet Take 81 mg  by mouth daily.    [name] b complex vitamins tablet Take 1 tablet by mouth daily.    . Cholecalciferol (VITAMIN D) 2000 UNITS tablet Take 2,000 Units by mouth daily.    [name] FLUoxetine (PROZAC) 40 MG capsule Take 1 capsule (40 mg total) by mouth daily. 90 capsule 3  . glipiZIDE (GLUCOTROL XL) 5 MG 24 hr tablet Take 1 tablet (5 mg total) by mouth daily with breakfast. 90 tablet 0  . metFORMIN (GLUCOPHAGE-XR) 500 MG  24 hr tablet Take 2 tablets (1,000 mg total) by mouth daily with breakfast. 180 tablet 3  . pravastatin (PRAVACHOL) 80 MG tablet Take 1 tablet (80 mg total) by mouth daily. 90 tablet 3   No current facility-administered medications for this visit.     Allergies  Allergen Reactions  . Atorvastatin Other (See Comments)    Muscle cramps  . Oxycodone Other (See Comments)    Does not work- Hydrocodone does work      Review of Systems:  Constitutional:  No  fever, no chills, No recent illness, No unintentional weight changes. +significant fatigue.   HEENT: No  headache, no vision change  Cardiac: No  chest pain, No  pressure, No palpitations  Respiratory:  No  shortness of breath. No  Cough  Gastrointestinal: No  abdominal pain, +nausea, No  vomiting,  No  blood in stool, No  diarrhea, No  constipation   Musculoskeletal: No new myalgia/arthralgia  Skin: No  Rash  Hem/Onc: No  easy bruising/bleeding  Endocrine: No cold intolerance,  No heat intolerance. No polyuria/polydipsia/polyphagia   Neurologic: No  weakness, No  dizziness, No  slurred speech/focal weakness/facial droop  Psychiatric: No  concerns with depression, No  concerns with anxiety, No sleep problems, No mood problems  Exam:  BP (!) 138/50 (BP Location: Left Arm, Patient Position: Sitting, Cuff Size: Normal)   Pulse 75   Temp 98.1 F (36.7 C) (Oral)   Wt 179 lb 11.2 oz (81.5 kg)   BMI 27.32 kg/m   Constitutional: VS see above. General Appearance: alert, well-developed, well-nourished, NAD  Eyes: Normal lids and conjunctive, non-icteric sclera  Ears, Nose, Mouth, Throat: MMM, Normal external inspection ears/nares/mouth/lips/gums.  Neck: No masses, trachea midline. No thyroid enlargement. No tenderness/mass appreciated. No lymphadenopathy  Respiratory: Normal respiratory effort. no wheeze, no rhonchi, no rales  Cardiovascular: S1/S2 normal, no murmur, no rub/gallop auscultated. RRR.  Musculoskeletal:  Gait normal. No clubbing/cyanosis of digits.   Neurological: Normal balance/coordination. +mild tremor. No cranial nerve deficit on limited exam. Motor and sensation intact and symmetric. Cerebellar reflexes intact. EOMI, PERRL   Skin: warm, dry, intact.  Psychiatric: Normal judgment/insight. Normal mood and affect. Oriented x3.      ASSESSMENT/PLAN: The primary encounter diagnosis was Other fatigue. A diagnosis of Tremor was also pertinent to this visit.   Patient would like to try stopping the glipizide for now, closely watching what she eats, monitoring blood sugars.  I am okay with this, can continue metformin.  Patient is advised to check fasting blood sugars daily, and as needed if any ill feeling  Orders Placed This Encounter  Procedures  . CBC with Differential/Platelet  . TSH  . T4, free  . COMPLETE METABOLIC PANEL WITH GFR  . Vitamin B12      Patient Instructions  Will hold glipizide If still tremor after 2 weeks, let me know! Will be due for A1C 09/11/2018 - come see me         Visit summary with  medication list and pertinent instructions was printed for patient to review. All questions at time of visit were answered - patient instructed to contact office with any additional concerns or updates. ER/RTC precautions were reviewed with the patient.   Note: Total time spent 25 minutes, greater than 50% of the visit was spent face-to-face counseling and coordinating care for the above diagnoses listed in assessment/plan.   Please note: voice recognition software was used to produce this document, and typos may escape review. Please contact Dr. Sheppard Coil for any needed clarifications.     Follow-up plan: Return for A1C approximately 09/11/2018.

## 2018-08-13 NOTE — Telephone Encounter (Signed)
I sill have it. Will discuss at visit.

## 2018-08-14 LAB — CBC WITH DIFFERENTIAL/PLATELET
ABSOLUTE MONOCYTES: 364 {cells}/uL (ref 200–950)
Basophils Absolute: 42 cells/uL (ref 0–200)
Basophils Relative: 0.6 %
EOS ABS: 343 {cells}/uL (ref 15–500)
Eosinophils Relative: 4.9 %
HCT: 35.6 % (ref 35.0–45.0)
Hemoglobin: 11.9 g/dL (ref 11.7–15.5)
LYMPHS ABS: 1932 {cells}/uL (ref 850–3900)
MCH: 30.4 pg (ref 27.0–33.0)
MCHC: 33.4 g/dL (ref 32.0–36.0)
MCV: 91 fL (ref 80.0–100.0)
MPV: 11.2 fL (ref 7.5–12.5)
Monocytes Relative: 5.2 %
Neutro Abs: 4319 cells/uL (ref 1500–7800)
Neutrophils Relative %: 61.7 %
Platelets: 326 10*3/uL (ref 140–400)
RBC: 3.91 10*6/uL (ref 3.80–5.10)
RDW: 13.1 % (ref 11.0–15.0)
Total Lymphocyte: 27.6 %
WBC: 7 10*3/uL (ref 3.8–10.8)

## 2018-08-14 LAB — COMPLETE METABOLIC PANEL WITH GFR
AG Ratio: 2 (calc) (ref 1.0–2.5)
ALT: 17 U/L (ref 6–29)
AST: 17 U/L (ref 10–35)
Albumin: 4.2 g/dL (ref 3.6–5.1)
Alkaline phosphatase (APISO): 82 U/L (ref 37–153)
BUN: 13 mg/dL (ref 7–25)
CO2: 27 mmol/L (ref 20–32)
CREATININE: 0.67 mg/dL (ref 0.60–0.93)
Calcium: 9.4 mg/dL (ref 8.6–10.4)
Chloride: 102 mmol/L (ref 98–110)
GFR, Est African American: 103 mL/min/{1.73_m2} (ref >=60)
GFR, Est Non African American: 89 mL/min/{1.73_m2} (ref >=60)
Globulin: 2.1 g/dL (calc) (ref 1.9–3.7)
Glucose, Bld: 196 mg/dL — ABNORMAL HIGH (ref 65–139)
Potassium: 4 mmol/L (ref 3.5–5.3)
Sodium: 138 mmol/L (ref 135–146)
Total Bilirubin: 0.4 mg/dL (ref 0.2–1.2)
Total Protein: 6.3 g/dL (ref 6.1–8.1)

## 2018-08-14 LAB — T4, FREE: Free T4: 1.3 ng/dL (ref 0.8–1.8)

## 2018-08-14 LAB — VITAMIN B12: Vitamin B-12: 578 pg/mL (ref 200–1100)

## 2018-08-14 LAB — TSH: TSH: 2.36 mIU/L (ref 0.40–4.50)

## 2018-08-24 ENCOUNTER — Other Ambulatory Visit: Payer: Self-pay | Admitting: Osteopathic Medicine

## 2018-08-24 DIAGNOSIS — F418 Other specified anxiety disorders: Secondary | ICD-10-CM

## 2018-09-06 ENCOUNTER — Other Ambulatory Visit: Payer: Self-pay | Admitting: Osteopathic Medicine

## 2018-09-11 ENCOUNTER — Encounter: Payer: Self-pay | Admitting: Osteopathic Medicine

## 2018-09-11 ENCOUNTER — Ambulatory Visit (INDEPENDENT_AMBULATORY_CARE_PROVIDER_SITE_OTHER): Payer: Medicare HMO | Admitting: Osteopathic Medicine

## 2018-09-11 ENCOUNTER — Other Ambulatory Visit: Payer: Self-pay

## 2018-09-11 VITALS — BP 134/57 | HR 74 | Temp 98.0°F | Wt 181.2 lb

## 2018-09-11 DIAGNOSIS — E119 Type 2 diabetes mellitus without complications: Secondary | ICD-10-CM | POA: Diagnosis not present

## 2018-09-11 DIAGNOSIS — F418 Other specified anxiety disorders: Secondary | ICD-10-CM

## 2018-09-11 DIAGNOSIS — E785 Hyperlipidemia, unspecified: Secondary | ICD-10-CM

## 2018-09-11 DIAGNOSIS — R69 Illness, unspecified: Secondary | ICD-10-CM | POA: Diagnosis not present

## 2018-09-11 LAB — POCT UA - MICROALBUMIN
Albumin/Creatinine Ratio, Urine, POC: NORMAL
Creatinine, POC: 50 mg/dL
Microalbumin Ur, POC: 10 mg/L

## 2018-09-11 LAB — POCT GLYCOSYLATED HEMOGLOBIN (HGB A1C): Hemoglobin A1C: 7.2 % — AB (ref 4.0–5.6)

## 2018-09-11 MED ORDER — FLUOXETINE HCL 40 MG PO CAPS: 40.0000 mg | ORAL_CAPSULE | Freq: Every day | ORAL | 3 refills | Status: DC

## 2018-09-11 MED ORDER — PRAVASTATIN SODIUM 80 MG PO TABS: 80.0000 mg | ORAL_TABLET | Freq: Every day | ORAL | 3 refills | Status: DC

## 2018-09-11 NOTE — Progress Notes (Signed)
HPI: Kelli [name] is a 73 y.o. female who  has a past medical history of Anxiety, Colon cancer ([place]), Depression, Depression with anxiety (09/19/2011), H/O colon cancer, stage II (09/19/2011), High cholesterol, Hyperlipidemia (09/19/2011), Lung nodules (09/19/2011), and Urinary urgency.  she presents to [facility] today, 09/11/18,  for chief complaint of:  DM2 follow-up  A1C and Rx management notes   03/11/18: 8.9% was eating high protein and limiting carbs but noted some weight gain so she's back to just general lower-carb diet.We tried adding jardiance, this was expensive but she did get it  06/12/18: 7.0% progress! Would like to try something cheaper than the jardiance. We tried Glipizide 10 mg daily, this was cut for a time d/t concern for hypoglycemia symptoms (tremors, fatigue episodes) but pt noted no serious difference w/ the change. She wanted to stop this Rx altogether and try strict diet/exercise + metformin.   09/11/18 today: 7.2% has been more careful about sugars, exercising seems to have made the most different. Doing ok off the Glipizide.    DIABETES SCREENING/PREVENTIVE CARE: BP goal <130/80: borderline diastolic  BP Readings from Last 3 Encounters:  08/13/18 (!) 138/50  07/10/18 94/74  06/12/18 (!) 129/54   LDL goal <70: No  Eye exam annually: none on file, importance discussed with patient, pt reports done in 04/2018 Foot exam: needs Microalbuminuria:negative Metformin: Yes  ACE/ARB: No  Antiplatelet if ASCVD Risk >10%: No  Statin: Yes  Pneumovax: Yes      Depression/anxiety - controlled on Prozac.   HLD - due for check lipids by next visit, watching her diet re: fats       At today's visit 09/11/18 ... PMH, PSH, FH reviewed and updated as needed.  Current medication list and allergy/intolerance hx reviewed and updated as needed. (See remainder of HPI, ROS, Phys Exam below)   No results found.  Results for  orders placed or performed in visit on 09/11/18 (from the past 72 hour(s))  POCT UA - Microalbumin     Status: None   Collection Time: 09/11/18  1:24 PM  Result Value Ref Range   Microalbumin Ur, POC 10 mg/L   Creatinine, POC 50 mg/dL   Albumin/Creatinine Ratio, Urine, POC Normal     Comment: <30 mg/g  POCT HgB A1C     Status: Abnormal   Collection Time: 09/11/18  1:25 PM  Result Value Ref Range   Hemoglobin A1C 7.2 (A) 4.0 - 5.6 %   HbA1c POC (<> result, manual entry)     HbA1c, POC (prediabetic range)     HbA1c, POC (controlled diabetic range)            ASSESSMENT/PLAN: The primary encounter diagnosis was Type 2 diabetes mellitus without complication, without long-term current use of insulin ([place]). Diagnoses of Depression with anxiety and Hyperlipidemia, unspecified hyperlipidemia type were also pertinent to this visit.   Doing well, no changes to meds   Orders Placed This Encounter  Procedures  . CBC  . COMPLETE METABOLIC PANEL WITH GFR  . Lipid panel  . Hemoglobin A1c  . POCT UA - Microalbumin  . POCT HgB A1C     Meds ordered this encounter  Medications  . pravastatin (PRAVACHOL) 80 MG tablet    Sig: Take 1 tablet (80 mg total) by mouth daily.    Dispense:  90 tablet    Refill:  3  . FLUoxetine (PROZAC) 40 MG capsule    Sig: Take 1 capsule (40  mg total) by mouth daily.    Dispense:  90 capsule    Refill:  3    DX Code Needed  .    There are no Patient Instructions on file for this visit.    Follow-up plan: Return in about 3 months (around 12/11/2018) for routine labs and physical exam w/ Dr A.                                                 ################################################# ################################################# ################################################# #################################################    Current Meds  Medication Sig  . AMBULATORY NON FORMULARY  MEDICATION Single glucometer with lancets, test strips. Test daily.  Disp qs 3 months E11.9  . aspirin 81 MG tablet Take 81 mg by mouth daily.  [name] b complex vitamins tablet Take 1 tablet by mouth daily.  . Cholecalciferol (VITAMIN D) 2000 UNITS tablet Take 2,000 Units by mouth daily.  [name] FLUoxetine (PROZAC) 40 MG capsule Take 1 capsule (40 mg total) by mouth daily.  . pravastatin (PRAVACHOL) 80 MG tablet Take 1 tablet (80 mg total) by mouth daily.  . [DISCONTINUED] FLUoxetine (PROZAC) 40 MG capsule TAKE 1 CAPSULE BY MOUTH EVERY DAY  . [DISCONTINUED] glipiZIDE (GLUCOTROL XL) 5 MG 24 hr tablet Take 1 tablet (5 mg total) by mouth daily with breakfast.  . [DISCONTINUED] pravastatin (PRAVACHOL) 80 MG tablet Take 1 tablet (80 mg total) by mouth daily.    Allergies  Allergen Reactions  . Atorvastatin Other (See Comments)    Muscle cramps  . Oxycodone Other (See Comments)    Does not work- Hydrocodone does work       Review of Systems:  Constitutional: No recent illness  Cardiac: No  chest pain, No  pressure, No palpitations  Respiratory:  No  shortness of breath.   Gastrointestinal: No  abdominal pain  Musculoskeletal: No new myalgia/arthralgia  Psychiatric: No  concerns with depression, No  concerns with anxiety  Exam:  BP (!) 134/57 (BP Location: Left Arm, Patient Position: Sitting, Cuff Size: Normal)   Pulse 74   Temp 98 F (36.7 C) (Oral)   Wt 181 lb 3.2 oz (82.2 kg)   BMI 27.55 kg/m   Constitutional: VS see above. General Appearance: alert, well-developed, well-nourished, NAD  Eyes: Normal lids and conjunctive, non-icteric sclera  Ears, Nose, Mouth, Throat: MMM, Normal external inspection ears/nares/mouth/lips/gums.  Neck: No masses, trachea midline.   Respiratory: Normal respiratory effort.  Musculoskeletal: Gait normal. Symmetric and independent movement of all extremities  Abdominal: non-tender, non-distended, no appreciable organomegaly, neg Murphy's, BS  [id number]  Neurological: Normal balance/coordination. No tremor.  Skin: warm, dry, intact.   Psychiatric: Normal judgment/insight. Normal mood and affect. Oriented x3.       Visit summary with medication list and pertinent instructions was printed for patient to review, patient was advised to alert [place] if any updates are needed. All questions at time of visit were answered - patient instructed to contact office with any additional concerns. ER/RTC precautions were reviewed with the patient and understanding verbalized.     Please note: voice recognition software was used to produce this document, and typos may escape review. Please contact Dr. Sheppard Coil for any needed clarifications.    Follow up plan: Return in about 3 months (around 12/11/2018) for routine labs and physical exam w/ Dr A.

## 2018-09-23 DIAGNOSIS — S199XXA Unspecified injury of neck, initial encounter: Secondary | ICD-10-CM | POA: Diagnosis not present

## 2018-09-23 DIAGNOSIS — E119 Type 2 diabetes mellitus without complications: Secondary | ICD-10-CM | POA: Diagnosis not present

## 2018-09-23 DIAGNOSIS — G8911 Acute pain due to trauma: Secondary | ICD-10-CM | POA: Diagnosis not present

## 2018-09-23 DIAGNOSIS — S42302A Unspecified fracture of shaft of humerus, left arm, initial encounter for closed fracture: Secondary | ICD-10-CM | POA: Diagnosis not present

## 2018-09-23 DIAGNOSIS — Z79899 Other long term (current) drug therapy: Secondary | ICD-10-CM | POA: Diagnosis not present

## 2018-09-23 DIAGNOSIS — S0990XA Unspecified injury of head, initial encounter: Secondary | ICD-10-CM | POA: Diagnosis not present

## 2018-09-23 DIAGNOSIS — S0993XA Unspecified injury of face, initial encounter: Secondary | ICD-10-CM | POA: Diagnosis not present

## 2018-09-23 DIAGNOSIS — Z85038 Personal history of other malignant neoplasm of large intestine: Secondary | ICD-10-CM | POA: Diagnosis not present

## 2018-09-23 DIAGNOSIS — S42202A Unspecified fracture of upper end of left humerus, initial encounter for closed fracture: Secondary | ICD-10-CM | POA: Diagnosis not present

## 2018-09-23 DIAGNOSIS — W010XXA Fall on same level from slipping, tripping and stumbling without subsequent striking against object, initial encounter: Secondary | ICD-10-CM | POA: Diagnosis not present

## 2018-09-23 DIAGNOSIS — S42355A Nondisplaced comminuted fracture of shaft of humerus, left arm, initial encounter for closed fracture: Secondary | ICD-10-CM | POA: Diagnosis not present

## 2018-09-24 ENCOUNTER — Telehealth (INDEPENDENT_AMBULATORY_CARE_PROVIDER_SITE_OTHER): Payer: Medicare HMO | Admitting: Osteopathic Medicine

## 2018-09-24 DIAGNOSIS — S42291A Other displaced fracture of upper end of right humerus, initial encounter for closed fracture: Secondary | ICD-10-CM | POA: Diagnosis not present

## 2018-09-24 NOTE — Telephone Encounter (Signed)
Please send referral to [facility] in [place].

## 2018-09-24 NOTE — Telephone Encounter (Signed)
Pt called clinic stating she was evaluated at [facility] ED last night and was diagnosed with a fractured shoulder. Reports they want to refer her to a Insurance risk surveyor, but she would prefer to stay within [facility] network since that is where all her records are and that is where she completed her cancer treatment.  She would like to see if PCP or Dr [name] would be willing to place a referral for her. Routing.

## 2018-09-24 NOTE — Telephone Encounter (Signed)
Referral sent - CF

## 2018-09-24 NOTE — Telephone Encounter (Signed)
Based on description of fracture from radiology read this fracture probably should be evaluated by an orthopedic surgeon.  I have referred you to [facility] in [place] who are in the [facility] and can see your records.  They will have a better or higher quality visit if you can get images on a CD and bring it with you to your appointment.  If they cannot see you in the near future or if you are having problems please schedule an appointment with me in clinic so that we can address any acute issues.  Total time 5 minutes.

## 2018-09-24 NOTE — Telephone Encounter (Signed)
Pt advised of referral, verbalized understanding.

## 2018-09-25 ENCOUNTER — Telehealth: Payer: Self-pay | Admitting: Family Medicine

## 2018-09-25 ENCOUNTER — Ambulatory Visit (INDEPENDENT_AMBULATORY_CARE_PROVIDER_SITE_OTHER): Payer: Medicare HMO | Admitting: Family Medicine

## 2018-09-25 DIAGNOSIS — S42291A Other displaced fracture of upper end of right humerus, initial encounter for closed fracture: Secondary | ICD-10-CM | POA: Diagnosis not present

## 2018-09-25 MED ORDER — HYDROCODONE-ACETAMINOPHEN 10-325 MG PO TABS: 1.0000 | ORAL_TABLET | Freq: Four times a day (QID) | ORAL | 0 refills | Status: DC | PRN

## 2018-09-25 NOTE — Progress Notes (Signed)
Virtual Visit  I connected with      [name]  by a telemedicine application and verified that I am speaking with the correct person using two identifiers.   I discussed the limitations of evaluation and management by telemedicine and the availability of in person appointments. The patient expressed understanding and agreed to proceed.  History of Present Illness: Kelli [name] is a 73 y.o. female who would like to discuss significant shoulder pain and fracture.   Patient tripped and fell landing on her left shoulder on April 20.  She was seen at [facility] emergency department here in [place].  While evaluated x-ray and then subsequent CT scan of the shoulder showed comminuted fracture of the left humeral head impacted fracture of the humeral neck.  In the emergency department she was referred to a [facility] orthopedic surgeon however she notes that the majority of her care is at [facility] health and she would like to see a [facility] orthopedic or sports medicine provider if possible.  After discharge she was provided with a shoulder immobilizer and hydrocodone for pain control.  She notes that she finds the shoulder immobilizer difficult to work and notes that 5 mg of hydrocodone is usually not sufficient to control her pain.  She notes significant shoulder pain especially with motion.  After contacting my office yesterday she was referred to [place] and has an appointment with Dr. Erlinda Hong tomorrow morning.   Patient notes that she denies any numbness or loss of sensation or motion to her distal left arm  Observations/Objective: Exam: Normal Speech.  Able to complete sentences.  No shortness of breath.   Lab and Radiology Results CT Shoulder LeftWO IV Contrast4/20/2020 [facility] Result Impression  Impression:   There is a comminuted fracture of the humeral head. There is an impacted fracture of the humeral neck. There is no joint dislocation.   Electronically  Signed by: Virl Cagey  Result Narrative   CT of the left shoulder.  Indication: left humeral head fracture.  Comparison: Plain x-ray for 20 2020.  Technique: Non-contrast axial images of the left shoulder were acquired with 1.610 mm slice thickness. Sagittal and coronal reformatted images were created from the data, and this examination was interpreted using these multiplanar reconstructions.  Findings: There is an old healed fracture of the clavicle.    There is a comminuted fracture of the humeral head. There is an impacted fracture of the humeral neck. There is no joint dislocation. There is degenerative arthrosis of the glenoid humeral joint.   Soft tissues are within normal limits.     Assessment and Plan: 73 y.o. female with  Left shoulder fracture.  Based on CT scan report description her shoulder injury may require surgical repair.  Certainly she would benefit from evaluation with orthopedic surgery.  She already has an appointment scheduled tomorrow but does not have images on a CD to take with her to the appointment.  Plan: Recommended calling the [facility] hospital and having images provided on a CD that a friend can pick up so that she can take to the appointment with Dr. Erlinda Hong tomorrow.   Prescribe a limited amount of hydrocodone 10/325 for temporary pain control.  Discussed how to use the shoulder immobilizer correctly and or using other methods of shoulder and arm immobilization including Ace wrap.  If nonsurgical approach is referred to surgery I am happy to proceed with that care here in [place] to avoid long distance travel if  possible.  PDMP reviewed during this encounter. No orders of the defined types were placed in this encounter.  Meds ordered this encounter  Medications  . HYDROcodone-acetaminophen (NORCO) 10-325 MG tablet    Sig: Take 1 tablet by mouth every 6 (six) hours as needed for up to 5 days.    Dispense:  15 tablet    Refill:  0     Follow Up Instructions:    I discussed the assessment and treatment plan with the patient. The patient was provided an opportunity to ask questions and all were answered. The patient agreed with the plan and demonstrated an understanding of the instructions.   The patient was advised to call back or seek an in-person evaluation if the symptoms worsen or if the condition fails to improve as anticipated.  Time: 15 minutes of intraservice time, with >22 minutes of total time during today's visit.      Historical information moved to improve visibility of documentation.  Past Medical History:  Diagnosis Date  . Anxiety   . Colon cancer ([place])    colon ca dx 02/2009  . Depression   . Depression with anxiety 09/19/2011  . H/O colon cancer, stage II 09/19/2011   T3N0  Cecum Sept 2010  . High cholesterol   . Hyperlipidemia 09/19/2011  . Lung nodules 09/19/2011   granulomas  . Urinary urgency    Past Surgical History:  Procedure Laterality Date  . APPENDECTOMY    . BREAST SURGERY  1978   augmentation  . COLON SURGERY     2010  . CRYO INTERCOSTAL NERVE BLOCK N/A 10/05/2014   Procedure: CRYO INTERCOSTAL NERVE BLOCK;  Surgeon: [name], MD;  Location: [place];  Service: Thoracic;  Laterality: N/A;  . LYMPH NODE DISSECTION Right 10/05/2014   Procedure: LYMPH NODE DISSECTION;  Surgeon: [name], MD;  Location: [place];  Service: Thoracic;  Laterality: Right;  . PARTIAL COLECTOMY Right 2010  . SEGMENTECOMY Right 10/05/2014   Procedure: right lower lobe SEGMENTECTOMY;  Surgeon: [name], MD;  Location: [place];  Service: Thoracic;  Laterality: Right;  [name] VIDEO ASSISTED THORACOSCOPY (VATS)/WEDGE RESECTION Right 10/05/2014   Procedure: Right VIDEO ASSISTED THORACOSCOPY (VATS) with right lower lobe lung nodule WEDGE RESECTION;  Surgeon: [name], MD;  Location: [place];  Service: Thoracic;  Laterality: Right;   Social History   Tobacco Use  . Smoking  status: Former Smoker    Packs/day: 3.00    Years: 40.00    Pack years: 120.00    Types: Cigarettes    Last attempt to quit: 02/06/2009    Years since quitting: 9.6  . Smokeless tobacco: Never Used  Substance Use Topics  . Alcohol use: Yes    Alcohol/week: 0.0 standard drinks    Comment: 3-4 times a week 1-2 beers   family history includes Heart disease in her brother and mother.  Medications: Current Outpatient Medications  Medication Sig Dispense Refill  . AMBULATORY NON FORMULARY MEDICATION Single glucometer with lancets, test strips. Test daily.  Disp qs 3 months E11.9 1 each 0  . aspirin 81 MG tablet Take 81 mg by mouth daily.    [name] b complex vitamins tablet Take 1 tablet by mouth daily.    . Cholecalciferol (VITAMIN D) 2000 UNITS tablet Take 2,000 Units by mouth daily.    [name] FLUoxetine (PROZAC) 40 MG capsule Take 1 capsule (40 mg total) by mouth daily. 90 capsule 3  . HYDROcodone-acetaminophen (NORCO) 10-325  MG tablet Take 1 tablet by mouth every 6 (six) hours as needed for up to 5 days. 15 tablet 0  . metFORMIN (GLUCOPHAGE-XR) 500 MG 24 hr tablet Take 2 tablets (1,000 mg total) by mouth daily with breakfast. 180 tablet 3  . pravastatin (PRAVACHOL) 80 MG tablet Take 1 tablet (80 mg total) by mouth daily. 90 tablet 3   No current facility-administered medications for this visit.    Allergies  Allergen Reactions  . Atorvastatin Other (See Comments)    Muscle cramps  . Oxycodone Other (See Comments)    Does not work- Hydrocodone does work

## 2018-09-25 NOTE — Telephone Encounter (Signed)
Pt advised. She is willing to do a telephone call today, does not have a smart phone. She is scheduled to see ortho tomorrow am.

## 2018-09-25 NOTE — Telephone Encounter (Signed)
Spoke with patient. Pt states she is crucial pain. She has a referral placed, but has not heard from [place] for appt. yet. Pt only has 1 pain pill left and is in need until she can see Ortho . Pharmacy verified. Advised pt she can call to check on appt. At [phone]. Advised patient I would call her back at [phone]

## 2018-09-25 NOTE — Telephone Encounter (Signed)
Schedule virtual visit with me today to go over situation and refill pain medicine.  Ok to also be seen in person if she would like that better

## 2018-09-26 ENCOUNTER — Other Ambulatory Visit: Payer: Self-pay

## 2018-09-26 ENCOUNTER — Ambulatory Visit (INDEPENDENT_AMBULATORY_CARE_PROVIDER_SITE_OTHER): Payer: Medicare HMO | Admitting: Orthopaedic Surgery

## 2018-09-26 ENCOUNTER — Encounter (INDEPENDENT_AMBULATORY_CARE_PROVIDER_SITE_OTHER): Payer: Self-pay | Admitting: Orthopaedic Surgery

## 2018-09-26 DIAGNOSIS — S42202A Unspecified fracture of upper end of left humerus, initial encounter for closed fracture: Secondary | ICD-10-CM | POA: Diagnosis not present

## 2018-09-26 MED ORDER — HYDROCODONE-ACETAMINOPHEN 5-325 MG PO TABS: 1.0000 | ORAL_TABLET | Freq: Four times a day (QID) | ORAL | 0 refills | Status: DC | PRN

## 2018-09-26 MED ORDER — ONDANSETRON HCL 4 MG PO TABS: 4.0000 mg | ORAL_TABLET | Freq: Four times a day (QID) | ORAL | 0 refills | Status: DC | PRN

## 2018-09-26 MED ORDER — METHOCARBAMOL 500 MG PO TABS: 500.0000 mg | ORAL_TABLET | Freq: Every evening | ORAL | 0 refills | Status: DC | PRN

## 2018-09-26 NOTE — Progress Notes (Signed)
Office Visit Note   Patient: Kelli [name]           Date of Birth: 1945-06-10           MRN: [id number] Visit Date: 09/26/2018              Requested by: Gregor Hams, MD [address] [place], [place] [address] PCP: Emeterio Reeve, DO   Assessment & Plan: Visit Diagnoses:  1. Closed fracture of proximal end of left humerus, unspecified fracture morphology, initial encounter     Plan: Impression is left proximal humerus fracture with a superiorly displaced greater tuberosity component.  After long discussion patient is a highly functional and active 73 year old female.  If the greater tuberosity is left in place this may cause impingement later on and result in decreased function.  Therefore I have recommended that she have a discussion with Dr. [name] to discuss the option of surgical treatment later today.  She is in agreement and she will await for Dr. Randel Pigg phone call today. Total face to face encounter time was greater than 45 minutes and over half of this time was spent in counseling and/or coordination of care.  Follow-Up Instructions: Return in about 1 week (around 10/03/2018).   Orders:  No orders of the defined types were placed in this encounter.  Meds ordered this encounter  Medications  . ondansetron (ZOFRAN) 4 MG tablet    Sig: Take 1 tablet (4 mg total) by mouth every 6 (six) hours as needed for nausea or vomiting.    Dispense:  20 tablet    Refill:  0  . methocarbamol (ROBAXIN) 500 MG tablet    Sig: Take 1 tablet (500 mg total) by mouth at bedtime as needed for muscle spasms.    Dispense:  20 tablet    Refill:  0  . HYDROcodone-acetaminophen (NORCO) 5-325 MG tablet    Sig: Take 1-2 tablets by mouth every 6 (six) hours as needed for moderate pain.    Dispense:  30 tablet    Refill:  0      Procedures: No procedures performed   Clinical Data: No additional findings.   Subjective: Chief Complaint  Patient presents with  . Left  Shoulder - Fracture    HPI patient is a pleasant 73 year old right-hand-dominant female who presents our clinic today with an injury to her left shoulder.  Approximately 3 days ago, she sustained a mechanical fall into a door jam subsequently hitting the floor.  She was seen at a [facility] facility where x-rays were obtained.  X-rays demonstrated a displaced proximal humerus fracture.  She was placed in a sling nonweightbearing.  She comes in today for further evaluation treatment recommendation.  She is having significant pain to the left shoulder.  This is constant in nature.  She has been taking 10 to 20 mg of Norco every 6-8 hours.  She denies any radicular symptoms.  Review of Systems as detailed in HPI.  All others reviewed and are negative.   Objective: Vital Signs: There were no vitals taken for this visit.  Physical Exam well-developed and well-nourished female no acute distress.  Alert and oriented x3.  Ortho Exam examination of her left shoulder reveals marked diffuse tenderness.  She does have ecchymosis to the upper arm.  She is neurovascularly intact distally.  Specialty Comments:  No specialty comments available.  Imaging: No new imaging   PMFS History: Patient Active Problem List   Diagnosis Date Noted  .  Closed fracture of left proximal humerus 09/26/2018  . Squamous cell carcinoma 03/05/2017  . Echocardiogram shows left ventricular diastolic dysfunction 37/09/8887  . Heart palpitations 07/25/2016  . Need for vaccination for pneumococcus 07/13/2016  . Osteoarthritis of hand, right 08/06/2015  . Diabetes mellitus type 2, uncomplicated ([place]) 16/94/5038  . Lung cancer, lower lobe ([place]) 10/09/2014  . Hematochezia 04/01/2013  . H/O colon cancer, stage II 09/19/2011  . Depression with anxiety 09/19/2011  . Hyperlipidemia 09/19/2011   Past Medical History:  Diagnosis Date  . Anxiety   . Colon cancer ([place])    colon ca dx 02/2009  . Depression   . Depression with  anxiety 09/19/2011  . H/O colon cancer, stage II 09/19/2011   T3N0  Cecum Sept 2010  . High cholesterol   . Hyperlipidemia 09/19/2011  . Lung nodules 09/19/2011   granulomas  . Urinary urgency     Family History  Problem Relation Age of Onset  . Heart disease Mother   . Heart disease Brother     Past Surgical History:  Procedure Laterality Date  . APPENDECTOMY    . BREAST SURGERY  1978   augmentation  . COLON SURGERY     2010  . CRYO INTERCOSTAL NERVE BLOCK N/A 10/05/2014   Procedure: CRYO INTERCOSTAL NERVE BLOCK;  Surgeon: [name], MD;  Location: [place];  Service: Thoracic;  Laterality: N/A;  . LYMPH NODE DISSECTION Right 10/05/2014   Procedure: LYMPH NODE DISSECTION;  Surgeon: [name], MD;  Location: [place];  Service: Thoracic;  Laterality: Right;  . PARTIAL COLECTOMY Right 2010  . SEGMENTECOMY Right 10/05/2014   Procedure: right lower lobe SEGMENTECTOMY;  Surgeon: [name], MD;  Location: [place];  Service: Thoracic;  Laterality: Right;  [name] VIDEO ASSISTED THORACOSCOPY (VATS)/WEDGE RESECTION Right 10/05/2014   Procedure: Right VIDEO ASSISTED THORACOSCOPY (VATS) with right lower lobe lung nodule WEDGE RESECTION;  Surgeon: [name], MD;  Location: [place];  Service: Thoracic;  Laterality: Right;   Social History   Occupational History  . Not on file  Tobacco Use  . Smoking status: Former Smoker    Packs/day: 3.00    Years: 40.00    Pack years: 120.00    Types: Cigarettes    Last attempt to quit: 02/06/2009    Years since quitting: 9.6  . Smokeless tobacco: Never Used  Substance and Sexual Activity  . Alcohol use: Yes    Alcohol/week: 0.0 standard drinks    Comment: 3-4 times a week 1-2 beers  . Drug use: No  . Sexual activity: Never

## 2018-09-27 ENCOUNTER — Other Ambulatory Visit (INDEPENDENT_AMBULATORY_CARE_PROVIDER_SITE_OTHER): Payer: Self-pay | Admitting: Orthopedic Surgery

## 2018-09-27 ENCOUNTER — Telehealth (INDEPENDENT_AMBULATORY_CARE_PROVIDER_SITE_OTHER): Payer: Self-pay

## 2018-09-27 ENCOUNTER — Telehealth (INDEPENDENT_AMBULATORY_CARE_PROVIDER_SITE_OTHER): Payer: Self-pay | Admitting: Orthopaedic Surgery

## 2018-09-27 MED ORDER — HYDROCODONE-ACETAMINOPHEN 10-325 MG PO TABS: 1.0000 | ORAL_TABLET | Freq: Four times a day (QID) | ORAL | 0 refills | Status: DC | PRN

## 2018-09-27 NOTE — Telephone Encounter (Signed)
I do not see a message.

## 2018-09-27 NOTE — Telephone Encounter (Signed)
Dr [name] called and wanted patient posted for surgery on Tuesday. Kelli [name] posted case but patient mentioned that she had been taking Norco 10/325 then when seen by Dr Erlinda Hong he changed her to [place] but it was not helping the pain so she had been taking 2 at a time and wanted to know if she could increase to 3 at a time.  I called and s/w Dr [name]. Advised patient not to take 3 at a time and he sent in rx for [place] 10/325 so patient would not be taking so much tylenol prior to surgery. Discussed with patient difficult to manage pain after surgery since she has already been taking high doses of narcotic pre-op

## 2018-09-30 ENCOUNTER — Other Ambulatory Visit: Payer: Self-pay

## 2018-09-30 ENCOUNTER — Encounter (HOSPITAL_COMMUNITY): Payer: Self-pay | Admitting: *Deleted

## 2018-09-30 NOTE — Progress Notes (Signed)
Spoke with pt for pre-op call. Pt denies cardiac history, HTN or diabetes.    Coronavirus Screening  Have you experienced the following symptoms:  Cough NO Fever (>100.20F)  NO Runny nose NO Sore throat NO Difficulty breathing/shortness of breath  NO  Have you or a family member traveled in the last 14 days and where? NO     Patient and cousin informed that hospital visitation restrictions are in effect and the importance of the restrictions.

## 2018-09-30 NOTE — H&P (Signed)
Kelli [name] is an 73 y.o. female.   Chief Complaint: Left shoulder pain HPI: Kelli [name] is a 73 year old female with left shoulder pain.  She is active and she enjoys swimming in her backyard.  She does do overhead activities with both arms.  She had a proximal humerus fracture from a fall and CT scanning demonstrates some migration of the tuberosity fragments into the subacromial space.  She presents now for operative management after explanation of risks and benefits.  Past Medical History:  Diagnosis Date  . Anxiety   . Colon cancer ([place])    colon ca dx 02/2009  . Depression   . Depression with anxiety 09/19/2011  . H/O colon cancer, stage II 09/19/2011   T3N0  Cecum Sept 2010  . High cholesterol   . Hyperlipidemia 09/19/2011  . Lung nodules 09/19/2011   granulomas  . Urinary urgency     Past Surgical History:  Procedure Laterality Date  . APPENDECTOMY    . BREAST SURGERY  1978   augmentation  . COLON SURGERY     2010  . CRYO INTERCOSTAL NERVE BLOCK N/A 10/05/2014   Procedure: CRYO INTERCOSTAL NERVE BLOCK;  Surgeon: [name], MD;  Location: [facility];  Service: Thoracic;  Laterality: N/A;  . LYMPH NODE DISSECTION Right 10/05/2014   Procedure: LYMPH NODE DISSECTION;  Surgeon: [name], MD;  Location: [facility];  Service: Thoracic;  Laterality: Right;  . PARTIAL COLECTOMY Right 2010  . SEGMENTECOMY Right 10/05/2014   Procedure: right lower lobe SEGMENTECTOMY;  Surgeon: [name], MD;  Location: [facility];  Service: Thoracic;  Laterality: Right;  [name] VIDEO ASSISTED THORACOSCOPY (VATS)/WEDGE RESECTION Right 10/05/2014   Procedure: Right VIDEO ASSISTED THORACOSCOPY (VATS) with right lower lobe lung nodule WEDGE RESECTION;  Surgeon: [name], MD;  Location: [facility];  Service: Thoracic;  Laterality: Right;    Family History  Problem Relation Age of Onset  . Heart disease Mother   . Heart disease Brother    Social History:  reports that she quit smoking about 9  years ago. Her smoking use included cigarettes. She has a 120.00 pack-year smoking history. She has never used smokeless tobacco. She reports current alcohol use. She reports that she does not use drugs.  Allergies:  Allergies  Allergen Reactions  . Atorvastatin Other (See Comments)    Muscle cramps  . Glipizide Er     Shaking / tingling in fingers   . Oxycodone Other (See Comments)    Does not work- Hydrocodone does work    No medications prior to admission.    No results found for this or any previous visit (from the past 48 hour(s)). No results found.  Review of Systems  Musculoskeletal: Positive for joint pain.    There were no vitals taken for this visit. Physical Exam  Constitutional: She appears well-developed.  HENT:  Head: Normocephalic.  Eyes: Pupils are equal, round, and reactive to light.  Neck: Normal range of motion.  Respiratory: Effort normal.  Neurological: She is alert.  Skin: Skin is warm.  Psychiatric: She has a normal mood and affect.  Examination of the left shoulder demonstrates ecchymosis and bruising with functional deltoid.  Motor or sensory function to the hand is intact.  Radial pulses intact.  Range of motion predictably limited.  Assessment/Plan Impression is displaced left proximal humerus fracture with migration of the tuberosity fragments into the subacromial space.  Plan is open reduction internal fixation with plate fixation and attempt  to decompress a subacromial space with better alignment of the tuberosities.  Risk and benefits are discussed including but not limited to infection nerve vessel damage loss of motion and loss of function.  Potential need for more surgery also discussed possibility relating to loss of blood supply to that humeral head.  Patient understands the risk and benefits and wishes to proceed.  All in all the decision point today is to try to get her a little bit better function particularly overhead motion.  I think that  would be limited with fracture healing in its current location.  Patient understands and wishes to proceed.  Anderson Malta, MD 09/30/2018, 10:08 AM

## 2018-10-01 ENCOUNTER — Encounter (HOSPITAL_COMMUNITY)
Admission: RE | Disposition: A | Discharge status: Home / Self Care | Payer: Self-pay | Source: Home / Self Care | Attending: Orthopedic Surgery

## 2018-10-01 ENCOUNTER — Telehealth: Payer: Self-pay | Admitting: Family Medicine

## 2018-10-01 ENCOUNTER — Ambulatory Visit (HOSPITAL_COMMUNITY): Payer: Medicare HMO | Admitting: Anesthesiology

## 2018-10-01 ENCOUNTER — Other Ambulatory Visit: Payer: Self-pay

## 2018-10-01 ENCOUNTER — Ambulatory Visit (HOSPITAL_COMMUNITY): Payer: Medicare HMO

## 2018-10-01 ENCOUNTER — Ambulatory Visit (HOSPITAL_COMMUNITY)
Admission: RE | Admit: 2018-10-01 | Discharge: 2018-10-01 | Disposition: A | Discharge status: Home / Self Care | Payer: Medicare HMO | Attending: Orthopedic Surgery | Admitting: Orthopedic Surgery

## 2018-10-01 ENCOUNTER — Encounter (HOSPITAL_COMMUNITY): Payer: Self-pay | Admitting: *Deleted

## 2018-10-01 DIAGNOSIS — W19XXXA Unspecified fall, initial encounter: Secondary | ICD-10-CM | POA: Diagnosis not present

## 2018-10-01 DIAGNOSIS — Z79899 Other long term (current) drug therapy: Secondary | ICD-10-CM | POA: Insufficient documentation

## 2018-10-01 DIAGNOSIS — Z9049 Acquired absence of other specified parts of digestive tract: Secondary | ICD-10-CM | POA: Diagnosis not present

## 2018-10-01 DIAGNOSIS — C3430 Malignant neoplasm of lower lobe, unspecified bronchus or lung: Secondary | ICD-10-CM | POA: Diagnosis not present

## 2018-10-01 DIAGNOSIS — S42202A Unspecified fracture of upper end of left humerus, initial encounter for closed fracture: Secondary | ICD-10-CM | POA: Insufficient documentation

## 2018-10-01 DIAGNOSIS — E785 Hyperlipidemia, unspecified: Secondary | ICD-10-CM | POA: Diagnosis not present

## 2018-10-01 DIAGNOSIS — Z85038 Personal history of other malignant neoplasm of large intestine: Secondary | ICD-10-CM | POA: Insufficient documentation

## 2018-10-01 DIAGNOSIS — Z87891 Personal history of nicotine dependence: Secondary | ICD-10-CM | POA: Insufficient documentation

## 2018-10-01 DIAGNOSIS — F418 Other specified anxiety disorders: Secondary | ICD-10-CM | POA: Diagnosis not present

## 2018-10-01 DIAGNOSIS — S42202D Unspecified fracture of upper end of left humerus, subsequent encounter for fracture with routine healing: Secondary | ICD-10-CM

## 2018-10-01 DIAGNOSIS — T148XXA Other injury of unspecified body region, initial encounter: Secondary | ICD-10-CM | POA: Diagnosis not present

## 2018-10-01 DIAGNOSIS — Z7984 Long term (current) use of oral hypoglycemic drugs: Secondary | ICD-10-CM | POA: Insufficient documentation

## 2018-10-01 DIAGNOSIS — E7800 Pure hypercholesterolemia, unspecified: Secondary | ICD-10-CM | POA: Insufficient documentation

## 2018-10-01 DIAGNOSIS — E119 Type 2 diabetes mellitus without complications: Secondary | ICD-10-CM | POA: Diagnosis not present

## 2018-10-01 DIAGNOSIS — R69 Illness, unspecified: Secondary | ICD-10-CM | POA: Diagnosis not present

## 2018-10-01 DIAGNOSIS — Z419 Encounter for procedure for purposes other than remedying health state, unspecified: Secondary | ICD-10-CM

## 2018-10-01 HISTORY — DX: Prediabetes: R73.03

## 2018-10-01 HISTORY — DX: Unspecified osteoarthritis, unspecified site: M19.90

## 2018-10-01 HISTORY — PX: ORIF HUMERUS FRACTURE: SHX2126

## 2018-10-01 LAB — BASIC METABOLIC PANEL
Anion gap: 12 (ref 5–15)
BUN: 14 mg/dL (ref 8–23)
CO2: 22 mmol/L (ref 22–32)
Calcium: 9.3 mg/dL (ref 8.9–10.3)
Chloride: 104 mmol/L (ref 98–111)
Creatinine, Ser: 0.56 mg/dL (ref 0.44–1.00)
GFR calc Af Amer: >60 mL/min (ref >60)
GFR calc non Af Amer: >60 mL/min (ref >60)
Glucose, Bld: 197 mg/dL — ABNORMAL HIGH (ref 70–99)
Potassium: 3.9 mmol/L (ref 3.5–5.1)
Sodium: 138 mmol/L (ref 135–145)

## 2018-10-01 LAB — CBC
HCT: 33 % — ABNORMAL LOW (ref 36.0–46.0)
Hemoglobin: 10.8 g/dL — ABNORMAL LOW (ref 12.0–15.0)
MCH: 31.4 pg (ref 26.0–34.0)
MCHC: 32.7 g/dL (ref 30.0–36.0)
MCV: 95.9 fL (ref 80.0–100.0)
Platelets: 360 10*3/uL (ref 150–400)
RBC: 3.44 MIL/uL — ABNORMAL LOW (ref 3.87–5.11)
RDW: 13.5 % (ref 11.5–15.5)
WBC: 8.8 10*3/uL (ref 4.0–10.5)
nRBC: 0 % (ref 0.0–0.2)

## 2018-10-01 LAB — GLUCOSE, CAPILLARY
Glucose-Capillary: 180 mg/dL — ABNORMAL HIGH (ref 70–99)
Glucose-Capillary: 182 mg/dL — ABNORMAL HIGH (ref 70–99)

## 2018-10-01 SURGERY — OPEN REDUCTION INTERNAL FIXATION (ORIF) PROXIMAL HUMERUS FRACTURE
Anesthesia: General | Laterality: Left

## 2018-10-01 MED ORDER — ONDANSETRON HCL 4 MG/2ML IJ SOLN
INTRAMUSCULAR | Status: AC
  Filled 2018-10-01: qty 2

## 2018-10-01 MED ORDER — PHENYLEPHRINE 40 MCG/ML (10ML) SYRINGE FOR IV PUSH (FOR BLOOD PRESSURE SUPPORT)
PREFILLED_SYRINGE | INTRAVENOUS | Status: AC
  Filled 2018-10-01: qty 10

## 2018-10-01 MED ORDER — FENTANYL CITRATE (PF) 250 MCG/5ML IJ SOLN
INTRAMUSCULAR | Status: DC | PRN
  Administered 2018-10-01 (×6): 50 ug via INTRAVENOUS

## 2018-10-01 MED ORDER — FENTANYL CITRATE (PF) 100 MCG/2ML IJ SOLN
INTRAMUSCULAR | Status: AC
  Administered 2018-10-01: 50 ug via INTRAVENOUS
  Filled 2018-10-01: qty 2

## 2018-10-01 MED ORDER — DEXAMETHASONE SODIUM PHOSPHATE 10 MG/ML IJ SOLN
INTRAMUSCULAR | Status: DC | PRN
  Administered 2018-10-01: 5 mg via INTRAVENOUS

## 2018-10-01 MED ORDER — ONDANSETRON HCL 4 MG/2ML IJ SOLN
INTRAMUSCULAR | Status: DC | PRN
  Administered 2018-10-01: 4 mg via INTRAVENOUS

## 2018-10-01 MED ORDER — FENTANYL CITRATE (PF) 250 MCG/5ML IJ SOLN
INTRAMUSCULAR | Status: AC
  Filled 2018-10-01: qty 5

## 2018-10-01 MED ORDER — SODIUM CHLORIDE 0.9 % IV SOLN
INTRAVENOUS | Status: DC | PRN
  Administered 2018-10-01: 15 ug/min via INTRAVENOUS

## 2018-10-01 MED ORDER — VANCOMYCIN HCL 500 MG IV SOLR
INTRAVENOUS | Status: AC
  Filled 2018-10-01: qty 500

## 2018-10-01 MED ORDER — SUCCINYLCHOLINE CHLORIDE 200 MG/10ML IV SOSY
PREFILLED_SYRINGE | INTRAVENOUS | Status: AC
  Filled 2018-10-01: qty 10

## 2018-10-01 MED ORDER — FENTANYL CITRATE (PF) 100 MCG/2ML IJ SOLN
50.0000 ug | Freq: Once | INTRAMUSCULAR | Status: AC
  Administered 2018-10-01: 12:00:00 50 ug via INTRAVENOUS

## 2018-10-01 MED ORDER — PROPOFOL 10 MG/ML IV BOLUS
INTRAVENOUS | Status: DC | PRN
  Administered 2018-10-01: 120 mg via INTRAVENOUS

## 2018-10-01 MED ORDER — PROPOFOL 10 MG/ML IV BOLUS
INTRAVENOUS | Status: AC
  Filled 2018-10-01: qty 20

## 2018-10-01 MED ORDER — CEFAZOLIN SODIUM-DEXTROSE 2-4 GM/100ML-% IV SOLN
INTRAVENOUS | Status: AC
  Administered 2018-10-01: 2000 mg
  Filled 2018-10-01: qty 100

## 2018-10-01 MED ORDER — SUGAMMADEX SODIUM 200 MG/2ML IV SOLN
INTRAVENOUS | Status: DC | PRN
  Administered 2018-10-01: 200 mg via INTRAVENOUS

## 2018-10-01 MED ORDER — MIDAZOLAM HCL 2 MG/2ML IJ SOLN
INTRAMUSCULAR | Status: AC
  Administered 2018-10-01: 12:00:00 1 mg via INTRAVENOUS
  Filled 2018-10-01: qty 2

## 2018-10-01 MED ORDER — VANCOMYCIN HCL 500 MG IV SOLR
INTRAVENOUS | Status: DC | PRN
  Administered 2018-10-01: 500 mg via TOPICAL

## 2018-10-01 MED ORDER — ROCURONIUM BROMIDE 50 MG/5ML IV SOSY
PREFILLED_SYRINGE | INTRAVENOUS | Status: AC
  Filled 2018-10-01: qty 5

## 2018-10-01 MED ORDER — CEFAZOLIN SODIUM-DEXTROSE 2-4 GM/100ML-% IV SOLN
2.0000 g | INTRAVENOUS | Status: AC
  Administered 2018-10-01: 2 g via INTRAVENOUS
  Filled 2018-10-01: qty 100

## 2018-10-01 MED ORDER — 0.9 % SODIUM CHLORIDE (POUR BTL) OPTIME
TOPICAL | Status: DC | PRN
  Administered 2018-10-01 (×3): 1000 mL

## 2018-10-01 MED ORDER — DEXAMETHASONE SODIUM PHOSPHATE 10 MG/ML IJ SOLN
INTRAMUSCULAR | Status: AC
  Filled 2018-10-01: qty 1

## 2018-10-01 MED ORDER — ROCURONIUM BROMIDE 10 MG/ML (PF) SYRINGE
PREFILLED_SYRINGE | INTRAVENOUS | Status: DC | PRN
  Administered 2018-10-01: 15 mg via INTRAVENOUS
  Administered 2018-10-01: 40 mg via INTRAVENOUS
  Administered 2018-10-01: 20 mg via INTRAVENOUS
  Administered 2018-10-01: 10 mg via INTRAVENOUS

## 2018-10-01 MED ORDER — MIDAZOLAM HCL 2 MG/2ML IJ SOLN
INTRAMUSCULAR | Status: AC
  Filled 2018-10-01: qty 2

## 2018-10-01 MED ORDER — CHLORHEXIDINE GLUCONATE 4 % EX LIQD: 60.0000 mL | Freq: Once | CUTANEOUS | Status: DC

## 2018-10-01 MED ORDER — LACTATED RINGERS IV SOLN
INTRAVENOUS | Status: DC
  Administered 2018-10-01: 11:00:00 via INTRAVENOUS

## 2018-10-01 MED ORDER — MIDAZOLAM HCL 2 MG/2ML IJ SOLN
1.0000 mg | Freq: Once | INTRAMUSCULAR | Status: AC
  Administered 2018-10-01: 1 mg via INTRAVENOUS

## 2018-10-01 MED ORDER — EPHEDRINE SULFATE-NACL 50-0.9 MG/10ML-% IV SOSY
PREFILLED_SYRINGE | INTRAVENOUS | Status: DC | PRN
  Administered 2018-10-01: 10 mg via INTRAVENOUS
  Administered 2018-10-01 (×2): 5 mg via INTRAVENOUS
  Administered 2018-10-01: 10 mg via INTRAVENOUS

## 2018-10-01 MED ORDER — LIDOCAINE 2% (20 MG/ML) 5 ML SYRINGE
INTRAMUSCULAR | Status: DC | PRN
  Administered 2018-10-01: 80 mg via INTRAVENOUS

## 2018-10-01 MED ORDER — LIDOCAINE 2% (20 MG/ML) 5 ML SYRINGE
INTRAMUSCULAR | Status: AC
  Filled 2018-10-01: qty 5

## 2018-10-01 MED ORDER — LACTATED RINGERS IV SOLN
INTRAVENOUS | Status: DC | PRN
  Administered 2018-10-01 (×3): via INTRAVENOUS

## 2018-10-01 MED ORDER — EPHEDRINE 5 MG/ML INJ
INTRAVENOUS | Status: AC
  Filled 2018-10-01: qty 10

## 2018-10-01 MED ORDER — HYDROCODONE-ACETAMINOPHEN 10-325 MG PO TABS: 1.0000 | ORAL_TABLET | Freq: Four times a day (QID) | ORAL | 0 refills | Status: DC | PRN

## 2018-10-01 MED ORDER — SUCCINYLCHOLINE CHLORIDE 200 MG/10ML IV SOSY
PREFILLED_SYRINGE | INTRAVENOUS | Status: DC | PRN
  Administered 2018-10-01: 100 mg via INTRAVENOUS

## 2018-10-01 SURGICAL SUPPLY — 70 items
ALCOHOL 70% 16 OZ (MISCELLANEOUS) ×2 IMPLANT
APL SKNCLS STERI-STRIP NONHPOA (GAUZE/BANDAGES/DRESSINGS) ×1
BENZOIN TINCTURE PRP APPL 2/3 (GAUZE/BANDAGES/DRESSINGS) ×2 IMPLANT
BIT DRILL 3.2 (BIT) ×2
BIT DRILL 3.2XCALB NS DISP (BIT) IMPLANT
BIT DRILL CALIBRATED 2.7 (BIT) ×1 IMPLANT
BIT DRL 3.2XCALB NS DISP (BIT) ×1
CABLE CERCLAGE W/NDL GRIP (Cable) ×1 IMPLANT
CHLORAPREP W/TINT 26ML (MISCELLANEOUS) ×4 IMPLANT
CLSR STERI-STRIP ANTIMIC 1/2X4 (GAUZE/BANDAGES/DRESSINGS) ×1 IMPLANT
COVER SURGICAL LIGHT HANDLE (MISCELLANEOUS) ×2 IMPLANT
DRAIN PENROSE 1/2X12 LTX STRL (WOUND CARE) IMPLANT
DRAPE C-ARM 42X72 X-RAY (DRAPES) ×1 IMPLANT
DRAPE U-SHAPE 47X51 STRL (DRAPES) ×4 IMPLANT
DRSG AQUACEL AG ADV 3.5X10 (GAUZE/BANDAGES/DRESSINGS) ×1 IMPLANT
DRSG PAD ABDOMINAL 8X10 ST (GAUZE/BANDAGES/DRESSINGS) ×1 IMPLANT
ELECT REM PT RETURN 9FT ADLT (ELECTROSURGICAL) ×2
ELECTRODE REM PT RTRN 9FT ADLT (ELECTROSURGICAL) ×1 IMPLANT
FACESHIELD WRAPAROUND (MASK) IMPLANT
FACESHIELD WRAPAROUND OR TEAM (MASK) ×1 IMPLANT
GAUZE SPONGE 4X4 12PLY STRL (GAUZE/BANDAGES/DRESSINGS) ×4 IMPLANT
GAUZE XEROFORM 5X9 LF (GAUZE/BANDAGES/DRESSINGS) IMPLANT
GLOVE BIOGEL PI IND STRL 8 (GLOVE) ×1 IMPLANT
GLOVE BIOGEL PI INDICATOR 8 (GLOVE) ×2
GLOVE SURG ORTHO 8.0 STRL STRW (GLOVE) ×2 IMPLANT
GOWN STRL REUS W/ TWL LRG LVL3 (GOWN DISPOSABLE) ×2 IMPLANT
GOWN STRL REUS W/ TWL XL LVL3 (GOWN DISPOSABLE) ×1 IMPLANT
GOWN STRL REUS W/TWL LRG LVL3 (GOWN DISPOSABLE) ×4
GOWN STRL REUS W/TWL XL LVL3 (GOWN DISPOSABLE) ×2
K-WIRE 2X5 SS THRDED S3 (WIRE) ×6
KIT BASIN OR (CUSTOM PROCEDURE TRAY) ×2 IMPLANT
KIT TURNOVER KIT B (KITS) ×2 IMPLANT
KWIRE 2X5 SS THRDED S3 (WIRE) IMPLANT
MANIFOLD NEPTUNE II (INSTRUMENTS) ×2 IMPLANT
NDL TAPERED W/ NITINOL LOOP (MISCELLANEOUS) IMPLANT
NEEDLE 21X1 OR PACK (NEEDLE) IMPLANT
NEEDLE TAPERED W/ NITINOL LOOP (MISCELLANEOUS) ×2 IMPLANT
NS IRRIG 1000ML POUR BTL (IV SOLUTION) ×2 IMPLANT
PACK SHOULDER (CUSTOM PROCEDURE TRAY) ×2 IMPLANT
PAD ARMBOARD 7.5X6 YLW CONV (MISCELLANEOUS) ×3 IMPLANT
PEG LOCKING 3.2X 28MM (Peg) ×1 IMPLANT
PLATE PROX HUMERUS HI LT 4H (Plate) ×1 IMPLANT
SCREW LOCK CORT STAR 3.5X24 (Screw) ×2 IMPLANT
SCREW LOCK CORT STAR 3.5X32 (Screw) ×2 IMPLANT
SCREW LOCK CORT STAR 3.5X36 (Screw) ×2 IMPLANT
SCREW LOCK CORT STAR 3.5X40 (Screw) ×1 IMPLANT
SCREW LOCK CORT STAR 3.5X46 (Screw) ×1 IMPLANT
SCREW LOCK CORT STAR 3.5X50 (Screw) ×1 IMPLANT
SCREW LP NL T15 3.5X24 (Screw) ×1 IMPLANT
SCREW LP NL T15 3.5X26 (Screw) ×1 IMPLANT
SCREW T15 MD 3.5X42MM NS (Screw) ×1 IMPLANT
SCREW T15 MD 3.5X46MM NS (Screw) ×1 IMPLANT
SCREW T15 MD 3.5X50MM NS (Screw) ×1 IMPLANT
SLEEVE MEASURING 3.2 (BIT) ×1 IMPLANT
SLING ARM IMMOBILIZER LRG (SOFTGOODS) ×1 IMPLANT
SPONGE LAP 18X18 RF (DISPOSABLE) ×2 IMPLANT
SPONGE LAP 4X18 RFD (DISPOSABLE) IMPLANT
SUCTION FRAZIER HANDLE 10FR (MISCELLANEOUS) ×1
SUCTION TUBE FRAZIER 10FR DISP (MISCELLANEOUS) IMPLANT
SUT BROADBAND TAPE 2PK 1.5 (SUTURE) ×2 IMPLANT
SUT MNCRL AB 3-0 PS2 18 (SUTURE) ×1 IMPLANT
SUT SILK 2 0 TIES 17X18 (SUTURE) ×2
SUT SILK 2-0 18XBRD TIE BLK (SUTURE) IMPLANT
SUT VIC AB 0 CT1 27 (SUTURE) ×2
SUT VIC AB 0 CT1 27XBRD ANBCTR (SUTURE) IMPLANT
SUT VIC AB 2-0 CT1 27 (SUTURE) ×2
SUT VIC AB 2-0 CT1 TAPERPNT 27 (SUTURE) IMPLANT
SUT VICRYL 0 UR6 27IN ABS (SUTURE) ×2 IMPLANT
TOWEL OR 17X24 6PK STRL BLUE (TOWEL DISPOSABLE) ×2 IMPLANT
TOWEL OR 17X26 10 PK STRL BLUE (TOWEL DISPOSABLE) ×2 IMPLANT

## 2018-10-01 NOTE — Transfer of Care (Signed)
Immediate Anesthesia Transfer of Care Note  Patient: Kelli [name]  Procedure(s) Performed: OPEN REDUCTION INTERNAL FIXATION (ORIF) LEFT PROXIMAL HUMERUS FRACTURE (Left )  Patient Location: PACU  Anesthesia Type:General  Level of Consciousness: awake, alert  and oriented  Airway & Oxygen Therapy: Patient Spontanous Breathing and Patient connected to face mask oxygen  Post-op Assessment: Report given to RN and Post -op Vital signs reviewed and stable  Post vital signs: Reviewed and stable  Last Vitals:  Vitals Value Taken Time  BP 119/70 10/01/2018  6:58 PM  Temp    Pulse 92 10/01/2018  6:58 PM  Resp 21 10/01/2018  6:58 PM  SpO2 99 % 10/01/2018  6:58 PM  Vitals shown include unvalidated device data.  Last Pain:  Vitals:   10/01/18 1220  TempSrc:   PainSc: 0-No pain      Patients Stated Pain Goal: 3 (74/12/87 8676)  Complications: No apparent anesthesia complications

## 2018-10-01 NOTE — Anesthesia Procedure Notes (Signed)
Procedure Name: Intubation Date/Time: 10/01/2018 3:03 PM Performed by: Jearld Pies, CRNA Pre-anesthesia Checklist: Patient identified, Emergency Drugs available, Suction available and Patient being monitored Patient Re-evaluated:Patient Re-evaluated prior to induction Oxygen Delivery Method: Circle System Utilized Preoxygenation: Pre-oxygenation with 100% oxygen Induction Type: IV induction and Rapid sequence Laryngoscope Size: Miller and 2 Grade View: Grade I Tube type: Oral Tube size: 7.0 mm Number of attempts: 1 Airway Equipment and Method: Stylet and Oral airway Placement Confirmation: ETT inserted through vocal cords under direct vision,  positive ETCO2 and breath sounds checked- equal and bilateral Secured at: 21 cm Tube secured with: Tape Dental Injury: Teeth and Oropharynx as per pre-operative assessment

## 2018-10-01 NOTE — Anesthesia Preprocedure Evaluation (Signed)
Anesthesia Evaluation  Patient identified by MRN, date of birth, ID band Patient awake    Reviewed: Allergy & Precautions, NPO status , Patient's Chart, lab work & pertinent test results  Airway Mallampati: II  TM Distance: >3 FB Neck ROM: Full    Dental  (+) Teeth Intact, Dental Advisory Given   Pulmonary former smoker,    breath sounds clear to auscultation       Cardiovascular  Rhythm:Regular Rate:Normal     Neuro/Psych    GI/Hepatic   Endo/Other    Renal/GU      Musculoskeletal   Abdominal   Peds  Hematology   Anesthesia Other Findings   Reproductive/Obstetrics                             Anesthesia Physical Anesthesia Plan  ASA: II  Anesthesia Plan: General   Post-op Pain Management:  Regional for Post-op pain   Induction: Intravenous  PONV Risk Score and Plan: Ondansetron and Dexamethasone  Airway Management Planned: Oral ETT  Additional Equipment:   Intra-op Plan:   Post-operative Plan: Extubation in OR  Informed Consent: I have reviewed the patients History and Physical, chart, labs and discussed the procedure including the risks, benefits and alternatives for the proposed anesthesia with the patient or authorized representative who has indicated his/her understanding and acceptance.     Dental advisory given  Plan Discussed with: CRNA and Anesthesiologist  Anesthesia Plan Comments:         Anesthesia Quick Evaluation

## 2018-10-01 NOTE — Telephone Encounter (Signed)
Will forward to provider for review, looks like it was discontinued last visit.

## 2018-10-01 NOTE — Anesthesia Postprocedure Evaluation (Signed)
Anesthesia Post Note  Patient: Kelli [name]  Procedure(s) Performed: OPEN REDUCTION INTERNAL FIXATION (ORIF) LEFT PROXIMAL HUMERUS FRACTURE (Left )     Patient location during evaluation: PACU Anesthesia Type: General Level of consciousness: awake and alert Pain management: pain level controlled Vital Signs Assessment: post-procedure vital signs reviewed and stable Respiratory status: spontaneous breathing, nonlabored ventilation, respiratory function stable and patient connected to nasal cannula oxygen Cardiovascular status: blood pressure returned to baseline and stable Postop Assessment: no apparent nausea or vomiting Anesthetic complications: no    Last Vitals:  Vitals:   10/01/18 1855 10/01/18 1913  BP: 119/70 (!) 120/59  Pulse: 94   Resp:    Temp:    SpO2: 97% 94%    Last Pain:  Vitals:   10/01/18 1913  TempSrc:   PainSc: 0-No pain                 Tiajuana Amass

## 2018-10-01 NOTE — Interval H&P Note (Signed)
History and Physical Interval Note:  10/01/2018 10:36 AM  Kelli [name]  has presented today for surgery, with the diagnosis of Left Proximal Humerus Fracture.  The various methods of treatment have been discussed with the patient and family. After consideration of risks, benefits and other options for treatment, the patient has consented to  Procedure(s): OPEN REDUCTION INTERNAL FIXATION (ORIF) LEFT PROXIMAL HUMERUS FRACTURE (Left) as a surgical intervention.  The patient's history has been reviewed, patient examined, no change in status, stable for surgery.  I have reviewed the patient's chart and labs.  Questions were answered to the patient's satisfaction.     Anderson Malta

## 2018-10-01 NOTE — Brief Op Note (Signed)
10/01/2018  6:51 PM  PATIENT:  [name]  73 y.o. female  PRE-OPERATIVE DIAGNOSIS:  Left Proximal Humerus Fracture  POST-OPERATIVE DIAGNOSIS:  Left Proximal Humerus Fracture  PROCEDURE:  Procedure(s): OPEN REDUCTION INTERNAL FIXATION (ORIF) LEFT PROXIMAL HUMERUS FRACTURE  SURGEON:  Surgeon(s): [name], Tonna Corner, MD  ASSISTANT: Benita Stabile  ANESTHESIA:   general  EBL: 100 ml    Total I/O In: 1000 [I.V.:1000] Out: 125 [Blood:125]  BLOOD ADMINISTERED: none  DRAINS: none   LOCAL MEDICATIONS USED:  none  SPECIMEN:  No Specimen  COUNTS:  YES  TOURNIQUET:  * No tourniquets in log *  DICTATION: .Other Dictation: Dictation Number [id number]  PLAN OF CARE: Discharge to home after PACU  PATIENT DISPOSITION:  PACU - hemodynamically stable

## 2018-10-01 NOTE — Op Note (Signed)
NAME: Kelli [name], COHICK MEDICAL RECORD MG:[id number] ACCOUNT [id number] DATE OF BIRTH:August 27, 1945 FACILITY: [facility] LOCATION: [facility] PHYSICIAN:GREGORY Randel Pigg, MD  OPERATIVE REPORT  DATE OF PROCEDURE:  10/01/2018  PREOPERATIVE DIAGNOSIS:  Left proximal humerus fracture with migration of the tuberosities.  POSTOPERATIVE DIAGNOSIS:  Left proximal humerus fracture with migration of the tuberosities.  PROCEDURE:  Left proximal humerus fracture open reduction internal fixation with plating using Biomet plate.  SURGEON:  [name], MD  ASSISTANT:  Benita Stabile, PA-C  ANESTHESIA:  General.  INDICATIONS:  This is a 73 year old female with a left proximal humerus fracture.  She presents now for operative management of that fracture after explanation of risks and benefits.  PROCEDURE IN DETAIL:  The patient was brought to the operating room where general endotracheal anesthesia was induced.  Preoperative antibiotics were administered.  Timeout was called.  The patient was placed in reverse Trendelenburg position on a  fracture-type bed.  Left shoulder, arm and hand prescrubbed with alcohol and Betadine and allowed to air dry, prepped with DuraPrep solution, and draped in a sterile manner.  Charlie Pitter was used to cover the operative field.  Timeout was called.   Deltopectoral approach was made.  Cephalic vein mobilized medially.  The deltoid was elevated distally.  Fracture was identified.  There was some compression of the humeral head fragment.  This was elevated to a mild degree, but not too much in order to  preserve the blood supply.  Stay sutures were placed around the tuberosity fragments.  There was a metaphyseal extension, and using careful blunt dissection, a cable was placed around the metaphysis in order to stabilize that part of the fracture.  This  gave a very nice job of stabilizing the fracture.  Blood supply was preserved.  The plate was then applied to the reduced fracture, and the  tuberosities were sutured and then pulled into and through the plate.  Correct placement of the plate and screw  length was confirmed in the AP and lateral planes under fluoroscopy.  After screws and pegs were placed, the tuberosities were sutured to the plate using Biomet suture tape.  This gave very secure fixation.  At this time, thorough irrigation was  performed and the deltopectoral split was closed using #1 Vicryl suture followed by interrupted inverted 0 Vicryl suture, 2-0 Vicryl suture, and a 3-0 Monocryl.  The Aquacel dressing and shoulder immobilizer were placed.  The patient tolerated the  procedure well without immediate complications.  He was transferred to the recovery room in stable condition.  LN/NUANCE  D:10/01/2018 T:10/01/2018 JOB:[id number]

## 2018-10-02 ENCOUNTER — Encounter (HOSPITAL_COMMUNITY): Payer: Self-pay | Admitting: Orthopedic Surgery

## 2018-10-02 ENCOUNTER — Telehealth: Payer: Self-pay | Admitting: Orthopedic Surgery

## 2018-10-02 DIAGNOSIS — S42202A Unspecified fracture of upper end of left humerus, initial encounter for closed fracture: Secondary | ICD-10-CM | POA: Diagnosis not present

## 2018-10-02 NOTE — Telephone Encounter (Signed)
FYI see below.

## 2018-10-02 NOTE — Telephone Encounter (Signed)
Ruby Cola with [employer] [facility] [phone] calling to find out if there is a special protocol for this patient, or is it standard.  Also machine, proline abduction pillow, or both?  This was the OPEN REDUCTION INTERNAL FIXATION (ORIF) LEFT PROXIMAL HUMERUS FRACTURE done yesterday.

## 2018-10-02 NOTE — Telephone Encounter (Signed)
UPDATE FROM BRENT:    "Patient declined services because she was in too much pain and did not want the brace.  She initially declined the CPM discounted self pay rate and we went to her house to set her up on the brace. That's when she declined the mobility brace due to pain"

## 2018-10-02 NOTE — Telephone Encounter (Signed)
Declned! Yes this Rx was d/c

## 2018-10-03 NOTE — Telephone Encounter (Signed)
ok 

## 2018-10-04 ENCOUNTER — Ambulatory Visit (INDEPENDENT_AMBULATORY_CARE_PROVIDER_SITE_OTHER): Payer: Medicare HMO | Admitting: Orthopedic Surgery

## 2018-10-04 ENCOUNTER — Encounter (INDEPENDENT_AMBULATORY_CARE_PROVIDER_SITE_OTHER): Payer: Self-pay | Admitting: Orthopedic Surgery

## 2018-10-04 ENCOUNTER — Other Ambulatory Visit: Payer: Self-pay

## 2018-10-04 ENCOUNTER — Ambulatory Visit (INDEPENDENT_AMBULATORY_CARE_PROVIDER_SITE_OTHER): Payer: Medicare HMO | Admitting: Orthopaedic Surgery

## 2018-10-04 DIAGNOSIS — S42202A Unspecified fracture of upper end of left humerus, initial encounter for closed fracture: Secondary | ICD-10-CM

## 2018-10-04 MED ORDER — OXYCODONE HCL 5 MG PO TABS: ORAL_TABLET | ORAL | 0 refills | Status: DC

## 2018-10-04 NOTE — Progress Notes (Signed)
Post-Op Visit Note   Patient: Kelli [name]           Date of Birth: 1945/10/12           MRN: [id number] Visit Date: 10/04/2018 PCP: Emeterio Reeve, DO   Assessment & Plan:  Chief Complaint:  Chief Complaint  Patient presents with  . Left Shoulder - Routine Post Op   Visit Diagnoses:  1. Closed fracture of proximal end of left humerus, unspecified fracture morphology, initial encounter     Plan: Dannya is now 3 days out left proximal humerus fracture fixation.  She has been using the CPM machine.  On exam the incision is intact.  Motor or sensory function to the hand is intact.  I will have her continue with CPM machine for passive range of motion only of that left shoulder.  See her back in a week and we will do x-rays and look at the incision.  I am to change her over from [place] to oxycodone because the Norco is not working quite as well for her as she would anticipate.  Follow-Up Instructions: Return in about 1 week (around 10/11/2018).   Orders:  No orders of the defined types were placed in this encounter.  Meds ordered this encounter  Medications  . oxyCODONE (ROXICODONE) 5 MG immediate release tablet    Sig: 1-2 po q 4-6 hrs prn pain    Dispense:  30 tablet    Refill:  0    Imaging: No results found.  PMFS History: Patient Active Problem List   Diagnosis Date Noted  . Closed fracture of left proximal humerus 09/26/2018  . Squamous cell carcinoma 03/05/2017  . Echocardiogram shows left ventricular diastolic dysfunction 38/46/6599  . Heart palpitations 07/25/2016  . Need for vaccination for pneumococcus 07/13/2016  . Osteoarthritis of hand, right 08/06/2015  . Diabetes mellitus type 2, uncomplicated ([place]) 35/70/1779  . Lung cancer, lower lobe ([place]) 10/09/2014  . Hematochezia 04/01/2013  . H/O colon cancer, stage II 09/19/2011  . Depression with anxiety 09/19/2011  . Hyperlipidemia 09/19/2011   Past Medical History:  Diagnosis Date  . Anxiety   .  Arthritis   . Borderline diabetic   . Colon cancer ([place])    colon ca dx 02/2009, lung cancer  . Depression   . Depression with anxiety 09/19/2011  . H/O colon cancer, stage II 09/19/2011   T3N0  Cecum Sept 2010  . High cholesterol   . Hyperlipidemia 09/19/2011  . Lung nodules 09/19/2011   granulomas  . Urinary urgency     Family History  Problem Relation Age of Onset  . Heart disease Mother   . Heart disease Brother     Past Surgical History:  Procedure Laterality Date  . APPENDECTOMY    . BREAST SURGERY  1978   augmentation  . COLON SURGERY     2010  . CRYO INTERCOSTAL NERVE BLOCK N/A 10/05/2014   Procedure: CRYO INTERCOSTAL NERVE BLOCK;  Surgeon: [name], MD;  Location: [facility];  Service: Thoracic;  Laterality: N/A;  . LYMPH NODE DISSECTION Right 10/05/2014   Procedure: LYMPH NODE DISSECTION;  Surgeon: [name], MD;  Location: [facility];  Service: Thoracic;  Laterality: Right;  . ORIF HUMERUS FRACTURE Left 10/01/2018   Procedure: OPEN REDUCTION INTERNAL FIXATION (ORIF) LEFT PROXIMAL HUMERUS FRACTURE;  Surgeon: [name], MD;  Location: [facility];  Service: Orthopedics;  Laterality: Left;  . PARTIAL COLECTOMY Right 2010  . SEGMENTECOMY Right 10/05/2014  Procedure: right lower lobe SEGMENTECTOMY;  Surgeon: [name], MD;  Location: [facility];  Service: Thoracic;  Laterality: Right;  [name] VIDEO ASSISTED THORACOSCOPY (VATS)/WEDGE RESECTION Right 10/05/2014   Procedure: Right VIDEO ASSISTED THORACOSCOPY (VATS) with right lower lobe lung nodule WEDGE RESECTION;  Surgeon: [name], MD;  Location: [facility];  Service: Thoracic;  Laterality: Right;   Social History   Occupational History  . Not on file  Tobacco Use  . Smoking status: Former Smoker    Packs/day: 3.00    Years: 40.00    Pack years: 120.00    Types: Cigarettes    Last attempt to quit: 02/06/2009    Years since quitting: 9.6  . Smokeless tobacco: Never Used  Substance and Sexual Activity   . Alcohol use: Yes    Alcohol/week: 0.0 standard drinks    Comment: 3-4 times a week 1-2 beers  . Drug use: No  . Sexual activity: Never    Birth control/protection: Post-menopausal

## 2018-10-11 ENCOUNTER — Other Ambulatory Visit: Payer: Self-pay

## 2018-10-11 ENCOUNTER — Encounter: Payer: Self-pay | Admitting: Orthopedic Surgery

## 2018-10-11 ENCOUNTER — Ambulatory Visit (INDEPENDENT_AMBULATORY_CARE_PROVIDER_SITE_OTHER): Payer: Medicare HMO

## 2018-10-11 ENCOUNTER — Ambulatory Visit (INDEPENDENT_AMBULATORY_CARE_PROVIDER_SITE_OTHER): Payer: Medicare HMO | Admitting: Orthopedic Surgery

## 2018-10-11 DIAGNOSIS — S42202A Unspecified fracture of upper end of left humerus, initial encounter for closed fracture: Secondary | ICD-10-CM | POA: Diagnosis not present

## 2018-10-11 NOTE — Progress Notes (Signed)
Post-Op Visit Note   Patient: Kelli [name]           Date of Birth: 01/17/1946           MRN: [id number] Visit Date: 10/11/2018 PCP: Emeterio Reeve, DO   Assessment & Plan:  Chief Complaint: No chief complaint on file.  Visit Diagnoses:  1. Closed fracture of proximal end of left humerus, unspecified fracture morphology, initial encounter     Plan: Auriana is a 73 year old patient with proximal humerus fracture fixed with open reduction internal fixation a week and a half ago.  On exam the incision is intact.  Deltoid fires.  Shoulder is predictably stiff.  X-rays look reasonable.  I will have Ovid Curd with CPM machine call Lilliane to try to get everything worked out with her CPM machine.  Need to do it 2 hours a day for a week and then 3 hours a day for the second week.  No lifting with the left arm.  DC the sling next week.  Come back in 2 weeks for repeat radiographs and clinical recheck.  I think there is a possibility that she could develop avascular necrosis and would require reverse shoulder replacement.  Follow-Up Instructions: Return in about 2 weeks (around 10/25/2018).   Orders:  Orders Placed This Encounter  Procedures  . XR Shoulder Left   No orders of the defined types were placed in this encounter.   Imaging: Xr Shoulder Left  Result Date: 10/11/2018 2 views left shoulder reveal hardware in good position alignment with no evidence of complication.  Proximal humerus fracture in reasonably good position.  Tuberosity is approximately at the level of the superior aspect of the humeral head.  No evidence of AVN.   PMFS History: Patient Active Problem List   Diagnosis Date Noted  . Closed fracture of left proximal humerus 09/26/2018  . Squamous cell carcinoma 03/05/2017  . Echocardiogram shows left ventricular diastolic dysfunction 24/40/1027  . Heart palpitations 07/25/2016  . Need for vaccination for pneumococcus 07/13/2016  . Osteoarthritis of hand, right  08/06/2015  . Diabetes mellitus type 2, uncomplicated ([place]) 25/36/6440  . Lung cancer, lower lobe ([place]) 10/09/2014  . Hematochezia 04/01/2013  . H/O colon cancer, stage II 09/19/2011  . Depression with anxiety 09/19/2011  . Hyperlipidemia 09/19/2011   Past Medical History:  Diagnosis Date  . Anxiety   . Arthritis   . Borderline diabetic   . Colon cancer ([place])    colon ca dx 02/2009, lung cancer  . Depression   . Depression with anxiety 09/19/2011  . H/O colon cancer, stage II 09/19/2011   T3N0  Cecum Sept 2010  . High cholesterol   . Hyperlipidemia 09/19/2011  . Lung nodules 09/19/2011   granulomas  . Urinary urgency     Family History  Problem Relation Age of Onset  . Heart disease Mother   . Heart disease Brother     Past Surgical History:  Procedure Laterality Date  . APPENDECTOMY    . BREAST SURGERY  1978   augmentation  . COLON SURGERY     2010  . CRYO INTERCOSTAL NERVE BLOCK N/A 10/05/2014   Procedure: CRYO INTERCOSTAL NERVE BLOCK;  Surgeon: [name], MD;  Location: [facility];  Service: Thoracic;  Laterality: N/A;  . LYMPH NODE DISSECTION Right 10/05/2014   Procedure: LYMPH NODE DISSECTION;  Surgeon: [name], MD;  Location: [facility];  Service: Thoracic;  Laterality: Right;  . ORIF HUMERUS FRACTURE Left 10/01/2018  Procedure: OPEN REDUCTION INTERNAL FIXATION (ORIF) LEFT PROXIMAL HUMERUS FRACTURE;  Surgeon: [name], MD;  Location: [facility];  Service: Orthopedics;  Laterality: Left;  . PARTIAL COLECTOMY Right 2010  . SEGMENTECOMY Right 10/05/2014   Procedure: right lower lobe SEGMENTECTOMY;  Surgeon: [name], MD;  Location: [facility];  Service: Thoracic;  Laterality: Right;  [name] VIDEO ASSISTED THORACOSCOPY (VATS)/WEDGE RESECTION Right 10/05/2014   Procedure: Right VIDEO ASSISTED THORACOSCOPY (VATS) with right lower lobe lung nodule WEDGE RESECTION;  Surgeon: [name], MD;  Location: [facility];  Service: Thoracic;  Laterality: Right;    Social History   Occupational History  . Not on file  Tobacco Use  . Smoking status: Former Smoker    Packs/day: 3.00    Years: 40.00    Pack years: 120.00    Types: Cigarettes    Last attempt to quit: 02/06/2009    Years since quitting: 9.6  . Smokeless tobacco: Never Used  Substance and Sexual Activity  . Alcohol use: Yes    Alcohol/week: 0.0 standard drinks    Comment: 3-4 times a week 1-2 beers  . Drug use: No  . Sexual activity: Never    Birth control/protection: Post-menopausal

## 2018-10-22 NOTE — Telephone Encounter (Signed)
Patient called in wanting a refill on her oxyCODONE (ROXICODONE) 5 MG immediate release tablet

## 2018-10-23 ENCOUNTER — Other Ambulatory Visit: Payer: Self-pay | Admitting: Orthopedic Surgery

## 2018-10-23 ENCOUNTER — Telehealth: Payer: Self-pay | Admitting: Orthopedic Surgery

## 2018-10-23 MED ORDER — HYDROCODONE-ACETAMINOPHEN 10-325 MG PO TABS: ORAL_TABLET | ORAL | 0 refills | Status: DC

## 2018-10-23 MED ORDER — OXYCODONE HCL 5 MG PO TABS: ORAL_TABLET | ORAL | 0 refills | Status: DC

## 2018-10-23 NOTE — Telephone Encounter (Signed)
Patient called advised she went to the pharmacy and the Rx was written for (Oxycodone) instead of (Hydrocodone) Patient said she have  The Rx for pills for Oxycodone. Patient asked if the Rx can be sent to [facility] on [address]. The number to contact patient is [phone]

## 2018-10-23 NOTE — Telephone Encounter (Signed)
y

## 2018-10-23 NOTE — Telephone Encounter (Signed)
Ok to rf? 

## 2018-10-23 NOTE — Telephone Encounter (Signed)
Ok for norco 10 325 1 po q 4 - 6 times a day# 40

## 2018-10-23 NOTE — Telephone Encounter (Signed)
Submitted the norco. Patient advised.

## 2018-10-23 NOTE — Telephone Encounter (Signed)
IC patient she said that the oxycodone does not help the pain. Wants the hydrocodone sent instead. Please advise if ok to refill the hydrocodone? If so how much and sig?

## 2018-10-23 NOTE — Telephone Encounter (Signed)
I will call and discuss with patient once I have reviewed with Dr [name]. Dr [name] prescribed the oxy.

## 2018-10-23 NOTE — Telephone Encounter (Signed)
Waiting for Dr [name] to sign Will call patient to advise done.

## 2018-10-25 ENCOUNTER — Telehealth: Payer: Self-pay | Admitting: Orthopedic Surgery

## 2018-10-25 ENCOUNTER — Ambulatory Visit (INDEPENDENT_AMBULATORY_CARE_PROVIDER_SITE_OTHER): Payer: Medicare HMO

## 2018-10-25 ENCOUNTER — Other Ambulatory Visit: Payer: Self-pay

## 2018-10-25 ENCOUNTER — Encounter: Payer: Self-pay | Admitting: Orthopedic Surgery

## 2018-10-25 ENCOUNTER — Ambulatory Visit (INDEPENDENT_AMBULATORY_CARE_PROVIDER_SITE_OTHER): Payer: Medicare HMO | Admitting: Orthopedic Surgery

## 2018-10-25 DIAGNOSIS — S42202D Unspecified fracture of upper end of left humerus, subsequent encounter for fracture with routine healing: Secondary | ICD-10-CM | POA: Diagnosis not present

## 2018-10-25 NOTE — Telephone Encounter (Signed)
Patient called asked if she suppose to continue not picking up anything with her lt hand? The number to contact patient is [phone]

## 2018-10-25 NOTE — Progress Notes (Signed)
Post-Op Visit Note   Patient: Kelli [name]           Date of Birth: February 12, 1946           MRN: [id number] Visit Date: 10/25/2018 PCP: Emeterio Reeve, DO   Assessment & Plan:  Chief Complaint:  Chief Complaint  Patient presents with  . Left Shoulder - Follow-up   Visit Diagnoses:  1. Closed fracture of proximal end of left humerus with routine healing, unspecified fracture morphology, subsequent encounter     Plan: Timiko is a patient is now about a month out left proximal humerus fracture fixation.  On exam she is predictably stiff.  Deltoid fires.  Radiographs look unchanged.  I would like for her to continue with range of motion exercises.  She did not tolerate the CPM so I am going to send her to therapy instead.  Come back in 4 weeks for repeat x-rays and assessment of range of motion.  Follow-Up Instructions: Return in about 4 weeks (around 11/22/2018).   Orders:  Orders Placed This Encounter  Procedures  . XR Shoulder Left   No orders of the defined types were placed in this encounter.   Imaging: Xr Shoulder Left  Result Date: 10/25/2018 2 views left shoulder reviewed.  Proximal humerus fracture plate fixation in good position alignment with no complicating features.  Shoulder is located.  No change from prior radiographs   PMFS History: Patient Active Problem List   Diagnosis Date Noted  . Closed fracture of left proximal humerus 09/26/2018  . Squamous cell carcinoma 03/05/2017  . Echocardiogram shows left ventricular diastolic dysfunction 41/66/0630  . Heart palpitations 07/25/2016  . Need for vaccination for pneumococcus 07/13/2016  . Osteoarthritis of hand, right 08/06/2015  . Diabetes mellitus type 2, uncomplicated ([place]) 16/06/930  . Lung cancer, lower lobe ([place]) 10/09/2014  . Hematochezia 04/01/2013  . H/O colon cancer, stage II 09/19/2011  . Depression with anxiety 09/19/2011  . Hyperlipidemia 09/19/2011   Past Medical History:  Diagnosis Date   . Anxiety   . Arthritis   . Borderline diabetic   . Colon cancer ([place])    colon ca dx 02/2009, lung cancer  . Depression   . Depression with anxiety 09/19/2011  . H/O colon cancer, stage II 09/19/2011   T3N0  Cecum Sept 2010  . High cholesterol   . Hyperlipidemia 09/19/2011  . Lung nodules 09/19/2011   granulomas  . Urinary urgency     Family History  Problem Relation Age of Onset  . Heart disease Mother   . Heart disease Brother     Past Surgical History:  Procedure Laterality Date  . APPENDECTOMY    . BREAST SURGERY  1978   augmentation  . COLON SURGERY     2010  . CRYO INTERCOSTAL NERVE BLOCK N/A 10/05/2014   Procedure: CRYO INTERCOSTAL NERVE BLOCK;  Surgeon: [name], MD;  Location: [place];  Service: Thoracic;  Laterality: N/A;  . LYMPH NODE DISSECTION Right 10/05/2014   Procedure: LYMPH NODE DISSECTION;  Surgeon: [name], MD;  Location: [place];  Service: Thoracic;  Laterality: Right;  . ORIF HUMERUS FRACTURE Left 10/01/2018   Procedure: OPEN REDUCTION INTERNAL FIXATION (ORIF) LEFT PROXIMAL HUMERUS FRACTURE;  Surgeon: [name], MD;  Location: [place];  Service: Orthopedics;  Laterality: Left;  . PARTIAL COLECTOMY Right 2010  . SEGMENTECOMY Right 10/05/2014   Procedure: right lower lobe SEGMENTECTOMY;  Surgeon: [name], MD;  Location: [place];  Service: Thoracic;  Laterality: Right;  [name] VIDEO ASSISTED THORACOSCOPY (VATS)/WEDGE RESECTION Right 10/05/2014   Procedure: Right VIDEO ASSISTED THORACOSCOPY (VATS) with right lower lobe lung nodule WEDGE RESECTION;  Surgeon: [name], MD;  Location: [place];  Service: Thoracic;  Laterality: Right;   Social History   Occupational History  . Not on file  Tobacco Use  . Smoking status: Former Smoker    Packs/day: 3.00    Years: 40.00    Pack years: 120.00    Types: Cigarettes    Last attempt to quit: 02/06/2009    Years since quitting: 9.7  . Smokeless tobacco: Never Used  Substance and  Sexual Activity  . Alcohol use: Yes    Alcohol/week: 0.0 standard drinks    Comment: 3-4 times a week 1-2 beers  . Drug use: No  . Sexual activity: Never    Birth control/protection: Post-menopausal

## 2018-10-29 ENCOUNTER — Ambulatory Visit: Payer: Medicare HMO | Admitting: Rehabilitative and Restorative Service Providers"

## 2018-10-29 ENCOUNTER — Other Ambulatory Visit: Payer: Self-pay

## 2018-10-29 ENCOUNTER — Encounter: Payer: Self-pay | Admitting: Rehabilitative and Restorative Service Providers"

## 2018-10-29 DIAGNOSIS — R29898 Other symptoms and signs involving the musculoskeletal system: Secondary | ICD-10-CM

## 2018-10-29 DIAGNOSIS — M6281 Muscle weakness (generalized): Secondary | ICD-10-CM | POA: Diagnosis not present

## 2018-10-29 DIAGNOSIS — M25512 Pain in left shoulder: Secondary | ICD-10-CM | POA: Diagnosis not present

## 2018-10-29 DIAGNOSIS — R293 Abnormal posture: Secondary | ICD-10-CM

## 2018-10-29 NOTE — Therapy (Signed)
[facility] [place], [place], [address] Phone: [phone]   Fax:  [phone]  Physical Therapy Evaluation  Patient Details  Name: Kelli [name] MRN: [id number] Date of Birth: Mar 24, 1946 Referring Provider (PT): Dr [name]    Encounter Date: 10/29/2018  PT End of Session - 10/29/18 1612    Visit Number  1    Number of Visits  24    Date for PT Re-Evaluation  01/21/19    PT Start Time  1505    PT Stop Time  1555    PT Time Calculation (min)  50 min    Activity Tolerance  Patient tolerated treatment well       Past Medical History:  Diagnosis Date  . Anxiety   . Arthritis   . Borderline diabetic   . Colon cancer ([place])    colon ca dx 02/2009, lung cancer  . Depression   . Depression with anxiety 09/19/2011  . H/O colon cancer, stage II 09/19/2011   T3N0  Cecum Sept 2010  . High cholesterol   . Hyperlipidemia 09/19/2011  . Lung nodules 09/19/2011   granulomas  . Urinary urgency     Past Surgical History:  Procedure Laterality Date  . APPENDECTOMY    . BREAST SURGERY  1978   augmentation  . COLON SURGERY     2010  . CRYO INTERCOSTAL NERVE BLOCK N/A 10/05/2014   Procedure: CRYO INTERCOSTAL NERVE BLOCK;  Surgeon: [name], MD;  Location: [place];  Service: Thoracic;  Laterality: N/A;  . LYMPH NODE DISSECTION Right 10/05/2014   Procedure: LYMPH NODE DISSECTION;  Surgeon: [name], MD;  Location: [place];  Service: Thoracic;  Laterality: Right;  . ORIF HUMERUS FRACTURE Left 10/01/2018   Procedure: OPEN REDUCTION INTERNAL FIXATION (ORIF) LEFT PROXIMAL HUMERUS FRACTURE;  Surgeon: [name], MD;  Location: [place];  Service: Orthopedics;  Laterality: Left;  . PARTIAL COLECTOMY Right 2010  . SEGMENTECOMY Right 10/05/2014   Procedure: right lower lobe SEGMENTECTOMY;  Surgeon: [name], MD;  Location: [place];  Service: Thoracic;  Laterality: Right;  [name] VIDEO  ASSISTED THORACOSCOPY (VATS)/WEDGE RESECTION Right 10/05/2014   Procedure: Right VIDEO ASSISTED THORACOSCOPY (VATS) with right lower lobe lung nodule WEDGE RESECTION;  Surgeon: [name], MD;  Location: [facility];  Service: Thoracic;  Laterality: Right;    There were no vitals filed for this visit.   Subjective Assessment - 10/29/18 1517    Subjective  Patient reports sustaining fx Lt shoulder 09/23/2018. She underwent ORIF 10/01/2018    Pertinent History  arthritis; AODM; MI; lung and colon cancer - now cancer free    Diagnostic tests  xrays     Patient Stated Goals  use arm Lt UE and get it moving again     Currently in Pain?  No/denies    Pain Score  --   with movement 5-6/10    Pain Location  Shoulder    Pain Orientation  Left    Pain Descriptors / Indicators  Shooting;Aching;Nagging    Pain Type  Acute pain    Pain Onset  More than a month ago    Pain Frequency  Intermittent    Aggravating Factors   using Lt LE; worse at the end of the day     Pain Relieving Factors  pain meds          [facility] PT Assessment - 10/29/18 0001  Assessment   Medical Diagnosis  Lt proximal humeral fracture     Referring Provider (PT)  Dr [name]     Onset Date/Surgical Date  10/01/18   fx - 09/23/18    Hand Dominance  Right    Next MD Visit  11/2018    Prior Therapy  here for Rt shoulder       Precautions   Precautions  None      Balance Screen   Has the patient fallen in the past 6 months  Yes    How many times?  1    Has the patient had a decrease in activity level because of a fear of falling?   No    Is the patient reluctant to leave their home because of a fear of falling?   No      Prior Function   Level of Independence  Independent    Vocation  Retired    Chief Strategy Officer retired ~ 2 yrs ago     Leisure  some household chores; sedentary; on line       Observation/Other Assessments   Focus on Therapeutic Outcomes (FOTO)   68% limitation        Sensation   Additional Comments  WFL's       Posture/Postural Control   Posture Comments  head forward; shoulders rounded; increased thoracic kyphosis; scapulae abducted and rotated along the thoracic wall       AROM   Right/Left Shoulder  --   Rt IR/ER assessed w/shd at 90 abd; Lt w/ UE at side    Right Shoulder Extension  69 Degrees    Right Shoulder Flexion  147 Degrees    Right Shoulder ABduction  150 Degrees    Right Shoulder Internal Rotation  35 Degrees    Right Shoulder External Rotation  78 Degrees    Left Shoulder Extension  21 Degrees    Left Shoulder Flexion  38 Degrees    Left Shoulder ABduction  45 Degrees    Left Shoulder Internal Rotation  --   hand across abdomen    Left Shoulder External Rotation  --   unable to achieve neutral w/elbow at side      Strength   Overall Strength Comments  strength not tested resistively - moves UE against gravity through aailable range       Palpation   Spinal mobility  hypomobility thoracic spine with CPA mobs and lateral mobs     Palpation comment  muscular tightness through cervical musculature; pecs; biceps; deltoid; teres                 Objective measurements completed on examination: See above findings.              PT Education - 10/29/18 1556    Education Details  HEP     Person(s) Educated  Patient    Methods  Explanation;Demonstration;Tactile cues;Verbal cues;Handout    Comprehension  Verbalized understanding;Returned demonstration;Verbal cues required;Tactile cues required       PT Short Term Goals - 10/29/18 1613      PT SHORT TERM GOAL #1   Title  Instruct patient in improved posture and alignment with pt to demonstrate activation of posterior shoulder girdle musculature 12/10/2018    Time  6    Period  Weeks    Status  New      PT SHORT TERM GOAL #2   Title  Independent in initial HEP  12/10/2018    Time  6    Period  Weeks    Status  New      PT SHORT TERM GOAL #3   Title   Instruct patient in appropriate exercises for home pool 12/10/2018    Time  6    Period  Weeks    Status  New        PT Long Term Goals - 10/29/18 1607      PT LONG TERM GOAL #1   Title  Improve posture and alignment with patient to demonstrate improved upright posture with posterior shoulder girdle engaged 01/21/2019    Time  12    Period  Weeks    Status  New      PT LONG TERM GOAL #2   Title  Increase AROM Lt shoulder to ~ 120 degrees shoulder elevation and 50-60 degrees ER and hand to sacrum IR 01/21/2019    Time  12    Period  Weeks    Status  New      PT LONG TERM GOAL #3   Title  Patient reports using Lt UE for functional activities including washing face; turning on/off light switch; etc 01/21/2019    Time  12    Period  Weeks    Status  New      PT LONG TERM GOAL #4   Title  Independent in HEP 01/21/2019    Time  12    Period  Weeks    Status  New      PT LONG TERM GOAL #5   Title  Improve FOTO =/< 42% limited by 01/21/2019    Time  12    Period  Weeks    Status  New             Plan - 10/29/18 1556    Clinical Impression Statement  Brittiney presents s/p Lt proximal humeral fracture 09/23/2018 with ORIF 10/01/2018. She has poor posture and alignment; significant limitations in ROM Lt shoulder; scapular dyskinesis; limited functional ability w/ Lt UE; pain. She will benefit from PT to address problems identified and restore normal function for Lt UE.     Stability/Clinical Decision Making  Stable/Uncomplicated    Clinical Decision Making  Low    Rehab Potential  Good    PT Frequency  2x / week    PT Duration  12 weeks    PT Treatment/Interventions  Patient/family education;ADLs/Self Care Home Management;Cryotherapy;Moist Heat;Iontophoresis 4mg /ml Dexamethasone;Therapeutic activities;Therapeutic exercise;Neuromuscular re-education;Manual techniques;Dry needling    PT Next Visit Plan  review HEP; progress with AAROM; PROM; AROM as tolerated; work on thoracic  extension and postural alignment; manual work including scar massage; moist heat or ice as indicated; NO MODALITIES     PT Home Exercise Plan  Access Code: [email or web address]     patient has a pool at her home     Recommended Other Services  NO MODALITIES other than heat/cold d/t history of cancer     Consulted and Agree with Plan of Care  Patient       Patient will benefit from skilled therapeutic intervention in order to improve the following deficits and impairments:  Postural dysfunction, Improper body mechanics, Pain, Increased fascial restricitons, Increased muscle spasms, Hypomobility, Decreased strength, Decreased mobility, Decreased range of motion, Decreased activity tolerance, Impaired UE functional use  Visit Diagnosis: Acute pain of left shoulder - Plan: PT plan of care cert/re-cert  Other symptoms and signs involving the musculoskeletal system - Plan: PT  plan of care cert/re-cert  Abnormal posture - Plan: PT plan of care cert/re-cert  Muscle weakness (generalized) - Plan: PT plan of care cert/re-cert     Problem List Patient Active Problem List   Diagnosis Date Noted  . Closed fracture of left proximal humerus 09/26/2018  . Squamous cell carcinoma 03/05/2017  . Echocardiogram shows left ventricular diastolic dysfunction 02/72/5366  . Heart palpitations 07/25/2016  . Need for vaccination for pneumococcus 07/13/2016  . Osteoarthritis of hand, right 08/06/2015  . Diabetes mellitus type 2, uncomplicated ([place]) 44/08/4740  . Lung cancer, lower lobe ([place]) 10/09/2014  . Hematochezia 04/01/2013  . H/O colon cancer, stage II 09/19/2011  . Depression with anxiety 09/19/2011  . Hyperlipidemia 09/19/2011    Odeth Bry Nilda Simmer PT, MPH  10/29/2018, 5:35 PM  [facility] [place], [place], [address] Phone: [phone]   Fax:  [phone]  Name: Kelli [name] MRN: [id number] Date of Birth: 1946-01-03

## 2018-10-29 NOTE — Telephone Encounter (Signed)
Not more than 5 lbs

## 2018-10-29 NOTE — Patient Instructions (Signed)
Access Code: [email or web address]  URL: [email or web address]  Date: 10/29/2018  Prepared by: Gillermo Murdoch   Exercises  Seated Cervical Retraction - 10 reps - 1 sets - 3x daily - 7x weekly  Standing Scapular Retraction - 10 reps - 1 sets - 10 hold - 3x daily - 7x weekly  Shoulder External Rotation and Scapular Retraction - 10 reps - 1 sets - hold - 3x daily - 7x weekly  Seated Shoulder External Rotation AAROM with Cane and Hand in Neutral - 10 reps - 1 sets - 10 sec hold - 3x daily - 7x weekly  Seated Shoulder Flexion Slide at Table Top with Forearm in Neutral - 5-10 reps - 1 sets - 10 sec hold - 3x daily - 7x weekly  Circular Shoulder Pendulum with Table Support - 3 reps - 1 sets - 30 sec hold - 2x daily - 7x weekly

## 2018-10-29 NOTE — Telephone Encounter (Signed)
Please advise. Thanks.  

## 2018-10-29 NOTE — Telephone Encounter (Signed)
IC s/w patient and advised  

## 2018-11-01 ENCOUNTER — Other Ambulatory Visit: Payer: Self-pay

## 2018-11-01 ENCOUNTER — Ambulatory Visit: Payer: Medicare HMO | Admitting: Physical Therapy

## 2018-11-01 DIAGNOSIS — M25512 Pain in left shoulder: Secondary | ICD-10-CM

## 2018-11-01 DIAGNOSIS — M6281 Muscle weakness (generalized): Secondary | ICD-10-CM

## 2018-11-01 DIAGNOSIS — R29898 Other symptoms and signs involving the musculoskeletal system: Secondary | ICD-10-CM

## 2018-11-01 DIAGNOSIS — R293 Abnormal posture: Secondary | ICD-10-CM | POA: Diagnosis not present

## 2018-11-01 NOTE — Therapy (Signed)
[place], [place], [address] Phone: [phone]   Fax:  [phone]  Physical Therapy Treatment  Patient Details  Name: Kelli [name] MRN: [id number] Date of Birth: May 14, 1946 Referring Provider (PT): Dr [name]    Encounter Date: 11/01/2018  PT End of Session - 11/01/18 1440    Visit Number  2    Number of Visits  24    Date for PT Re-Evaluation  01/21/19    PT Start Time  7342    PT Stop Time  1420    PT Time Calculation (min)  43 min    Behavior During Therapy  [facility] for tasks assessed/performed       Past Medical History:  Diagnosis Date  . Anxiety   . Arthritis   . Borderline diabetic   . Colon cancer ([place])    colon ca dx 02/2009, lung cancer  . Depression   . Depression with anxiety 09/19/2011  . H/O colon cancer, stage II 09/19/2011   T3N0  Cecum Sept 2010  . High cholesterol   . Hyperlipidemia 09/19/2011  . Lung nodules 09/19/2011   granulomas  . Urinary urgency     Past Surgical History:  Procedure Laterality Date  . APPENDECTOMY    . BREAST SURGERY  1978   augmentation  . COLON SURGERY     2010  . CRYO INTERCOSTAL NERVE BLOCK N/A 10/05/2014   Procedure: CRYO INTERCOSTAL NERVE BLOCK;  Surgeon: [name], MD;  Location: [place];  Service: Thoracic;  Laterality: N/A;  . LYMPH NODE DISSECTION Right 10/05/2014   Procedure: LYMPH NODE DISSECTION;  Surgeon: [name], MD;  Location: [place];  Service: Thoracic;  Laterality: Right;  . ORIF HUMERUS FRACTURE Left 10/01/2018   Procedure: OPEN REDUCTION INTERNAL FIXATION (ORIF) LEFT PROXIMAL HUMERUS FRACTURE;  Surgeon: [name], MD;  Location: [place];  Service: Orthopedics;  Laterality: Left;  . PARTIAL COLECTOMY Right 2010  . SEGMENTECOMY Right 10/05/2014   Procedure: right lower lobe SEGMENTECTOMY;  Surgeon: [name], MD;  Location: [facility];  Service: Thoracic;  Laterality: Right;  [name] VIDEO  ASSISTED THORACOSCOPY (VATS)/WEDGE RESECTION Right 10/05/2014   Procedure: Right VIDEO ASSISTED THORACOSCOPY (VATS) with right lower lobe lung nodule WEDGE RESECTION;  Surgeon: [name], MD;  Location: [place];  Service: Thoracic;  Laterality: Right;    There were no vitals filed for this visit.  Subjective Assessment - 11/01/18 1342    Subjective  Kelli [name] reports she hasn't done as much of the HEP as she should have.     Pertinent History  arthritis; AODM; MI; lung and colon cancer - now cancer free    Patient Stated Goals  use arm Lt UE and get it moving again     Currently in Pain?  No/denies    Pain Score  0-No pain         [facility] PT Assessment - 11/01/18 0001      Assessment   Medical Diagnosis  Lt proximal humeral fracture     Referring Provider (PT)  Dr [name]     Onset Date/Surgical Date  10/01/18   fx - 09/23/18    Hand Dominance  Right    Next MD Visit  11/2018    Prior Therapy  here for Rt shoulder       [facility] Adult PT Treatment/Exercise - 11/01/18 0001      Shoulder Exercises: Supine  External Rotation  AAROM;12 reps   cane   Flexion  AAROM;Both;10 reps   press up with cane, towards ceiling     Shoulder Exercises: Seated   Retraction  Both;5 reps   5 sec hold   External Rotation Limitations  2 reps of ER AAROM with cane, changed position to supine for improved form/ tolerance    Other Seated Exercises  chin tucks x 3 sec x 5 reps      Shoulder Exercises: Standing   Internal Rotation  10 reps;AAROM   side to side; up and over buttocks, with cane   Extension  AAROM;Both;12 reps   cane   Other Standing Exercises  pendulum with LUE - guarded; multiple cues for technique      Shoulder Exercises: Stretch   Table Stretch - Flexion  5 reps    Table Stretch -Flexion Limitations  trial with BUE on rolling table x 5 reps; (other 5 reps single arm sliding on mat)       Modalities   Modalities  --   declined; will use at home.      Manual Therapy    Manual Therapy  Passive ROM;Taping;Soft tissue mobilization    Manual therapy comments  pt in hooklying     Soft tissue mobilization  gentle STM around Lt shoulder incision, bicep and pec with elbow in neutral    Passive ROM  Lt shoulder ER (with elbow supported into scaption); scaption, flexion - all to tissue limits and tolerance    Kinesiotex  --   sensitive skin in zig zag over scar Lt shoulder              PT Short Term Goals - 10/29/18 1613      PT SHORT TERM GOAL #1   Title  Instruct patient in improved posture and alignment with pt to demonstrate activation of posterior shoulder girdle musculature 12/10/2018    Time  6    Period  Weeks    Status  New      PT SHORT TERM GOAL #2   Title  Independent in initial HEP 12/10/2018    Time  6    Period  Weeks    Status  New      PT SHORT TERM GOAL #3   Title  Instruct patient in appropriate exercises for home pool 12/10/2018    Time  6    Period  Weeks    Status  New        PT Long Term Goals - 10/29/18 1607      PT LONG TERM GOAL #1   Title  Improve posture and alignment with patient to demonstrate improved upright posture with posterior shoulder girdle engaged 01/21/2019    Time  12    Period  Weeks    Status  New      PT LONG TERM GOAL #2   Title  Increase AROM Lt shoulder to ~ 120 degrees shoulder elevation and 50-60 degrees ER and hand to sacrum IR 01/21/2019    Time  12    Period  Weeks    Status  New      PT LONG TERM GOAL #3   Title  Patient reports using Lt UE for functional activities including washing face; turning on/off light switch; etc 01/21/2019    Time  12    Period  Weeks    Status  New      PT LONG TERM GOAL #4   Title  Independent in HEP 01/21/2019    Time  12    Period  Weeks    Status  New      PT LONG TERM GOAL #5   Title  Improve FOTO =/< 42% limited by 01/21/2019    Time  12    Period  Weeks    Status  New            Plan - 11/01/18 1441    Clinical Impression Statement   Pt guarded with ROM exercises (AAROM/AROM) and PROM.  PROM performed within tissue limits and to pt tolerance.  Encouraged pt to be more diligent with HEP to assist with return of functional mobility of arm.  Trial of Rock tape applied to incision for scar management and sensitivity.   Goals are ongoing at this time.     Rehab Potential  Good    PT Frequency  2x / week    PT Duration  12 weeks    PT Treatment/Interventions  Patient/family education;ADLs/Self Care Home Management;Cryotherapy;Moist Heat;Iontophoresis 4mg /ml Dexamethasone;Therapeutic activities;Therapeutic exercise;Neuromuscular re-education;Manual techniques;Dry needling    PT Next Visit Plan  progress with AAROM; PROM; AROM as tolerated; work on thoracic extension and postural alignment; manual work    PT Home Exercise Plan  Access Code: [email or web address]    ( patient has a pool at her home )    Recommended Other Services  NO MODALITIES other than cold/heat due to history of cancer    Consulted and Agree with Plan of Care  Patient       Patient will benefit from skilled therapeutic intervention in order to improve the following deficits and impairments:  Postural dysfunction, Improper body mechanics, Pain, Increased fascial restricitons, Increased muscle spasms, Hypomobility, Decreased strength, Decreased mobility, Decreased range of motion, Decreased activity tolerance, Impaired UE functional use  Visit Diagnosis: Acute pain of left shoulder  Other symptoms and signs involving the musculoskeletal system  Abnormal posture  Muscle weakness (generalized)     Problem List Patient Active Problem List   Diagnosis Date Noted  . Closed fracture of left proximal humerus 09/26/2018  . Squamous cell carcinoma 03/05/2017  . Echocardiogram shows left ventricular diastolic dysfunction 54/49/2010  . Heart palpitations 07/25/2016  . Need for vaccination for pneumococcus 07/13/2016  . Osteoarthritis of hand, right 08/06/2015  . Diabetes  mellitus type 2, uncomplicated ([place]) 12/14/1973  . Lung cancer, lower lobe ([place]) 10/09/2014  . Hematochezia 04/01/2013  . H/O colon cancer, stage II 09/19/2011  . Depression with anxiety 09/19/2011  . Hyperlipidemia 09/19/2011   Kerin Perna, PTA 11/01/18 2:45 PM  [place], [place], [address] Phone: [phone]   Fax:  [phone]  Name: Kelli [name] MRN: [id number] Date of Birth: 1945/07/14

## 2018-11-03 ENCOUNTER — Telehealth: Payer: Self-pay | Admitting: Cardiology

## 2018-11-03 ENCOUNTER — Other Ambulatory Visit: Payer: Medicare HMO

## 2018-11-03 DIAGNOSIS — Z20822 Contact with and (suspected) exposure to covid-19: Secondary | ICD-10-CM

## 2018-11-03 NOTE — Telephone Encounter (Signed)
Spoke with patient regarding recent office visit 5/22 and possible Covid exposure.  Covid test ordered appt 5/31 Sunday 1430

## 2018-11-04 LAB — NOVEL CORONAVIRUS, NAA: SARS-CoV-2, NAA: NOT DETECTED

## 2018-11-05 ENCOUNTER — Encounter: Payer: Medicare HMO | Admitting: Physical Therapy

## 2018-11-08 ENCOUNTER — Other Ambulatory Visit: Payer: Self-pay

## 2018-11-08 ENCOUNTER — Encounter: Payer: Self-pay | Admitting: Rehabilitative and Restorative Service Providers"

## 2018-11-08 ENCOUNTER — Ambulatory Visit: Payer: Medicare HMO | Admitting: Rehabilitative and Restorative Service Providers"

## 2018-11-08 DIAGNOSIS — R29898 Other symptoms and signs involving the musculoskeletal system: Secondary | ICD-10-CM | POA: Diagnosis not present

## 2018-11-08 DIAGNOSIS — M6281 Muscle weakness (generalized): Secondary | ICD-10-CM

## 2018-11-08 DIAGNOSIS — R293 Abnormal posture: Secondary | ICD-10-CM | POA: Diagnosis not present

## 2018-11-08 DIAGNOSIS — M25512 Pain in left shoulder: Secondary | ICD-10-CM | POA: Diagnosis not present

## 2018-11-08 NOTE — Patient Instructions (Signed)
POOL EXERCISES    Warm up - can be 15-20 reps  Swing arms at side   Push arms forward and back alternating   Flap wings - elbows in   Open book bringing hands out like to get a hug - keep shoulder relaxed    Stretches - hold at least 10 sec and do at least 10 reps  Place hands on edge of pool and step back to stretch hold at least 10 sec  (can bend knees to increase stretch or bend hips pushing hips back)   Clasp hands behind back and slide left hand across hips to the right  Clasp hands behind and slide left hand up back

## 2018-11-08 NOTE — Therapy (Signed)
[facility] [place], [place], [address] Phone: [phone]   Fax:  [phone]  Physical Therapy Treatment  Patient Details  Name: Kelli [name] MRN: [id number] Date of Birth: 07-10-1945 Referring Provider (PT): Dr [name]    Encounter Date: 11/08/2018  PT End of Session - 11/08/18 1351    Visit Number  3    Number of Visits  24    Date for PT Re-Evaluation  01/21/19    PT Start Time  6962    PT Stop Time  1430   patient 21 monutes late for appointment    PT Time Calculation (min)  39 min       Past Medical History:  Diagnosis Date  . Anxiety   . Arthritis   . Borderline diabetic   . Colon cancer ([place])    colon ca dx 02/2009, lung cancer  . Depression   . Depression with anxiety 09/19/2011  . H/O colon cancer, stage II 09/19/2011   T3N0  Cecum Sept 2010  . High cholesterol   . Hyperlipidemia 09/19/2011  . Lung nodules 09/19/2011   granulomas  . Urinary urgency     Past Surgical History:  Procedure Laterality Date  . APPENDECTOMY    . BREAST SURGERY  1978   augmentation  . COLON SURGERY     2010  . CRYO INTERCOSTAL NERVE BLOCK N/A 10/05/2014   Procedure: CRYO INTERCOSTAL NERVE BLOCK;  Surgeon: [name], MD;  Location: [place];  Service: Thoracic;  Laterality: N/A;  . LYMPH NODE DISSECTION Right 10/05/2014   Procedure: LYMPH NODE DISSECTION;  Surgeon: [name], MD;  Location: [place];  Service: Thoracic;  Laterality: Right;  . ORIF HUMERUS FRACTURE Left 10/01/2018   Procedure: OPEN REDUCTION INTERNAL FIXATION (ORIF) LEFT PROXIMAL HUMERUS FRACTURE;  Surgeon: [name], MD;  Location: [place];  Service: Orthopedics;  Laterality: Left;  . PARTIAL COLECTOMY Right 2010  . SEGMENTECOMY Right 10/05/2014   Procedure: right lower lobe SEGMENTECTOMY;  Surgeon: [name], MD;  Location: [place];  Service: Thoracic;  Laterality: Right;  [name] VIDEO ASSISTED THORACOSCOPY  (VATS)/WEDGE RESECTION Right 10/05/2014   Procedure: Right VIDEO ASSISTED THORACOSCOPY (VATS) with right lower lobe lung nodule WEDGE RESECTION;  Surgeon: [name], MD;  Location: [place];  Service: Thoracic;  Laterality: Right;    There were no vitals filed for this visit.  Subjective Assessment - 11/08/18 1355    Subjective  Patient reports that she has not been consisitent with HEP. She has not been taking her pain meds - afraid that she was becoming dependent on meds to sleep.     Currently in Pain?  No/denies                       [facility] Adult PT Treatment/Exercise - 11/08/18 0001      Exercises   Exercises  --   pt instructed in/practiced ex for her pool(in instructions)     Shoulder Exercises: Seated   Retraction Limitations  scap squeeze and work on postural control     Other Seated Exercises  using noodle to work on AROM Lt shoulder pushing noodle frd/back; rolling noodle on thighs; noodle out and in; noodle circles CW/CCW      Shoulder Exercises: Pulleys   Flexion  --   10 sec hold x 10 reps    Other Pulley Exercises  trial of gentle movement bringing  arm to side using pulley to support wt of arm       Shoulder Exercises: Therapy Ball   Flexion Limitations  rolling large green ball out on Rx table to work on shd flexion 10 sec x 10       Shoulder Exercises: Stretch   External Rotation Stretch  --   w/ cane towel at side 10 sec x 10   Other Shoulder Stretches  extension with cane assist with Rt UE 5-10 sec hold x 10    Other Shoulder Stretches  IR sliding cane up back 5-10 sec hold x 10              PT Education - 11/08/18 1420    Education Details  pool exercises     Person(s) Educated  Patient    Methods  Explanation;Demonstration;Tactile cues;Verbal cues;Handout    Comprehension  Verbalized understanding;Returned demonstration;Verbal cues required;Tactile cues required       PT Short Term Goals - 10/29/18 1613      PT SHORT TERM  GOAL #1   Title  Instruct patient in improved posture and alignment with pt to demonstrate activation of posterior shoulder girdle musculature 12/10/2018    Time  6    Period  Weeks    Status  New      PT SHORT TERM GOAL #2   Title  Independent in initial HEP 12/10/2018    Time  6    Period  Weeks    Status  New      PT SHORT TERM GOAL #3   Title  Instruct patient in appropriate exercises for home pool 12/10/2018    Time  6    Period  Weeks    Status  New        PT Long Term Goals - 10/29/18 1607      PT LONG TERM GOAL #1   Title  Improve posture and alignment with patient to demonstrate improved upright posture with posterior shoulder girdle engaged 01/21/2019    Time  12    Period  Weeks    Status  New      PT LONG TERM GOAL #2   Title  Increase AROM Lt shoulder to ~ 120 degrees shoulder elevation and 50-60 degrees ER and hand to sacrum IR 01/21/2019    Time  12    Period  Weeks    Status  New      PT LONG TERM GOAL #3   Title  Patient reports using Lt UE for functional activities including washing face; turning on/off light switch; etc 01/21/2019    Time  12    Period  Weeks    Status  New      PT LONG TERM GOAL #4   Title  Independent in HEP 01/21/2019    Time  12    Period  Weeks    Status  New      PT LONG TERM GOAL #5   Title  Improve FOTO =/< 42% limited by 01/21/2019    Time  12    Period  Weeks    Status  New            Plan - 11/08/18 1355    Clinical Impression Statement  Patient was late for appointment. Focus was on exercise insturction for pt for use with her home pool which is now open. Patient also continued with AAROM exercises in the clinic. Kelli [name] demonstrates significant associated movement patterns and abnormal  movement patterns with active movement if Lt LE with poor disassociation of scapula and GH joint.     Rehab Potential  Good    PT Frequency  2x / week    PT Duration  12 weeks    PT Treatment/Interventions  Patient/family  education;ADLs/Self Care Home Management;Cryotherapy;Moist Heat;Iontophoresis 4mg /ml Dexamethasone;Therapeutic activities;Therapeutic exercise;Neuromuscular re-education;Manual techniques;Dry needling    PT Next Visit Plan  progress with AAROM; PROM; AROM as tolerated; work on thoracic extension and postural alignment; manual work    PT Home Exercise Plan  Access Code: [email or web address]    ( patient has a pool at her home )    Consulted and Agree with Plan of Care  Patient       Patient will benefit from skilled therapeutic intervention in order to improve the following deficits and impairments:  Postural dysfunction, Improper body mechanics, Pain, Increased fascial restricitons, Increased muscle spasms, Hypomobility, Decreased strength, Decreased mobility, Decreased range of motion, Decreased activity tolerance, Impaired UE functional use  Visit Diagnosis: Acute pain of left shoulder  Other symptoms and signs involving the musculoskeletal system  Abnormal posture  Muscle weakness (generalized)     Problem List Patient Active Problem List   Diagnosis Date Noted  . Closed fracture of left proximal humerus 09/26/2018  . Squamous cell carcinoma 03/05/2017  . Echocardiogram shows left ventricular diastolic dysfunction 39/53/2023  . Heart palpitations 07/25/2016  . Need for vaccination for pneumococcus 07/13/2016  . Osteoarthritis of hand, right 08/06/2015  . Diabetes mellitus type 2, uncomplicated ([place]) 34/35/6861  . Lung cancer, lower lobe ([place]) 10/09/2014  . Hematochezia 04/01/2013  . H/O colon cancer, stage II 09/19/2011  . Depression with anxiety 09/19/2011  . Hyperlipidemia 09/19/2011     Nilda Simmer PT, MPH  11/08/2018, 3:32 PM  [facility] [place], [place], [address] Phone: [phone]   Fax:  [phone]  Name: Kelli [name] MRN: [id number] Date of Birth: 1945/07/27

## 2018-11-11 ENCOUNTER — Other Ambulatory Visit: Payer: Self-pay | Admitting: Orthopedic Surgery

## 2018-11-11 ENCOUNTER — Telehealth: Payer: Self-pay | Admitting: Orthopedic Surgery

## 2018-11-11 MED ORDER — HYDROCODONE-ACETAMINOPHEN 5-325 MG PO TABS: ORAL_TABLET | ORAL | 0 refills | Status: DC

## 2018-11-11 NOTE — Telephone Encounter (Signed)
Pt called in left vm said she needs a refill of her hydrocodone.  [phone]

## 2018-11-11 NOTE — Telephone Encounter (Signed)
Dr [name] sent to pharmacy. Patient advised done.

## 2018-11-11 NOTE — Telephone Encounter (Signed)
y

## 2018-11-11 NOTE — Telephone Encounter (Signed)
Ok to rf? 

## 2018-11-12 ENCOUNTER — Other Ambulatory Visit: Payer: Self-pay

## 2018-11-12 ENCOUNTER — Ambulatory Visit: Payer: Medicare HMO | Admitting: Physical Therapy

## 2018-11-12 DIAGNOSIS — M25512 Pain in left shoulder: Secondary | ICD-10-CM | POA: Diagnosis not present

## 2018-11-12 DIAGNOSIS — R293 Abnormal posture: Secondary | ICD-10-CM

## 2018-11-12 DIAGNOSIS — R29898 Other symptoms and signs involving the musculoskeletal system: Secondary | ICD-10-CM | POA: Diagnosis not present

## 2018-11-12 NOTE — Therapy (Signed)
[place], [place], [address] Phone: [phone]   Fax:  [phone]  Physical Therapy Treatment  Patient Details  Name: Kelli [name] MRN: [id number] Date of Birth: 01/26/1946 Referring Provider (PT): Dr [name]    Encounter Date: 11/12/2018  PT End of Session - 11/12/18 1209    Visit Number  4    Number of Visits  24    Date for PT Re-Evaluation  01/21/19    PT Start Time  1205    PT Stop Time  1247    PT Time Calculation (min)  42 min    Activity Tolerance  Patient tolerated treatment well    Behavior During Therapy  [facility] for tasks assessed/performed       Past Medical History:  Diagnosis Date  . Anxiety   . Arthritis   . Borderline diabetic   . Colon cancer ([place])    colon ca dx 02/2009, lung cancer  . Depression   . Depression with anxiety 09/19/2011  . H/O colon cancer, stage II 09/19/2011   T3N0  Cecum Sept 2010  . High cholesterol   . Hyperlipidemia 09/19/2011  . Lung nodules 09/19/2011   granulomas  . Urinary urgency     Past Surgical History:  Procedure Laterality Date  . APPENDECTOMY    . BREAST SURGERY  1978   augmentation  . COLON SURGERY     2010  . CRYO INTERCOSTAL NERVE BLOCK N/A 10/05/2014   Procedure: CRYO INTERCOSTAL NERVE BLOCK;  Surgeon: [name], MD;  Location: [place];  Service: Thoracic;  Laterality: N/A;  . LYMPH NODE DISSECTION Right 10/05/2014   Procedure: LYMPH NODE DISSECTION;  Surgeon: [name], MD;  Location: [place];  Service: Thoracic;  Laterality: Right;  . ORIF HUMERUS FRACTURE Left 10/01/2018   Procedure: OPEN REDUCTION INTERNAL FIXATION (ORIF) LEFT PROXIMAL HUMERUS FRACTURE;  Surgeon: [name], MD;  Location: [place];  Service: Orthopedics;  Laterality: Left;  . PARTIAL COLECTOMY Right 2010  . SEGMENTECOMY Right 10/05/2014   Procedure: right lower lobe SEGMENTECTOMY;  Surgeon: [name], MD;  Location: [place];  Service: Thoracic;  Laterality: Right;  [name] VIDEO ASSISTED THORACOSCOPY (VATS)/WEDGE RESECTION Right 10/05/2014   Procedure: Right VIDEO ASSISTED THORACOSCOPY (VATS) with right lower lobe lung nodule WEDGE RESECTION;  Surgeon: [name], MD;  Location: [place];  Service: Thoracic;  Laterality: Right;    There were no vitals filed for this visit.  Subjective Assessment - 11/12/18 1209    Subjective  Pt reports she got in her pool 2 days ago and did the exercises she could remember.  She was in for an hour, but has only been in 1x.  She quit taking pain medicine at night and was unable to sleep, so she has returned to taking meds at night.     Pertinent History  arthritis; AODM; MI; lung and colon cancer - now cancer free    Patient Stated Goals  use arm Lt UE and get it moving again     Currently in Pain?  Yes   medicine prior to session    Pain Score  1     Pain Location  Shoulder    Pain Orientation  Left    Pain Descriptors / Indicators  Aching    Aggravating Factors   any use of LUE    Pain Relieving Factors  pain medication  [facility] PT Assessment - 11/12/18 0001      Assessment   Medical Diagnosis  Lt proximal humeral fracture     Referring Provider (PT)  Dr [name]     Onset Date/Surgical Date  10/01/18   fx - 09/23/18    Hand Dominance  Right    Next MD Visit  11/2018    Prior Therapy  here for Rt shoulder       AROM   Left Shoulder Extension  25 Degrees    Left Shoulder Flexion  52 Degrees    Left Shoulder ABduction  52 Degrees    Left Shoulder External Rotation  --   unable to achieve neutral w/elbow at side       [facility] Adult PT Treatment/Exercise - 11/12/18 0001      Shoulder Exercises: Supine   External Rotation  AAROM;12 reps   cane   Flexion  AAROM;Both;10 reps   press up with cane, towards ceiling     Shoulder Exercises: Seated   Retraction Limitations  scap squeeze and work on postural control     Other Seated Exercises  using  noodle to work on AROM Lt shoulder pushing noodle frd/back; rolling noodle on thighs; noodle out and in; noodle circles CW/CCW; pendulum with LUE between exercises.       Shoulder Exercises: Standing   Internal Rotation  10 reps;AAROM   side to side; up and over buttocks, with cane   Extension  AAROM;Both;12 reps   cane     Modalities   Modalities  --   declined; will use at home.      Manual Therapy   Manual therapy comments  pt in hooklying     Soft tissue mobilization  gentle STM around Lt shoulder incision, bicep and pec with elbow in neutral    Passive ROM  Lt shoulder ER (with elbow supported into scaption); scaption, flexion, ext - all to tissue limits and tolerance               PT Short Term Goals - 11/12/18 1305      PT SHORT TERM GOAL #1   Title  Instruct patient in improved posture and alignment with pt to demonstrate activation of posterior shoulder girdle musculature 12/10/2018    Period  Weeks    Status  On-going      PT SHORT TERM GOAL #2   Title  Independent in initial HEP 12/10/2018    Baseline  encouraged compliance    Time  6    Period  Weeks    Status  On-going      PT SHORT TERM GOAL #3   Title  Instruct patient in appropriate exercises for home pool 12/10/2018    Time  6    Period  Weeks    Status  Achieved        PT Long Term Goals - 10/29/18 1607      PT LONG TERM GOAL #1   Title  Improve posture and alignment with patient to demonstrate improved upright posture with posterior shoulder girdle engaged 01/21/2019    Time  12    Period  Weeks    Status  New      PT LONG TERM GOAL #2   Title  Increase AROM Lt shoulder to ~ 120 degrees shoulder elevation and 50-60 degrees ER and hand to sacrum IR 01/21/2019    Time  12    Period  Weeks    Status  New      PT LONG TERM GOAL #3   Title  Patient reports using Lt UE for functional activities including washing face; turning on/off light switch; etc 01/21/2019    Time  12    Period  Weeks     Status  New      PT LONG TERM GOAL #4   Title  Independent in HEP 01/21/2019    Time  12    Period  Weeks    Status  New      PT LONG TERM GOAL #5   Title  Improve FOTO =/< 42% limited by 01/21/2019    Time  12    Period  Weeks    Status  New            Plan - 11/12/18 1235    Clinical Impression Statement  Pt demonstrate slight improvement in Lt shoulder AROM.  She was able to lift LUE to 90 deg flexion with cane (AAROM) in supine.  Pt tolerated treatment with mild increase in pain; reduced with rest breaks.  She continues to demonstrate abnormal movement patterns with poor disassociation of scapula and GH joint; will benefit from continued PT intervention to maximize functional mobility.     Rehab Potential  Good    PT Frequency  2x / week    PT Duration  12 weeks    PT Treatment/Interventions  Patient/family education;ADLs/Self Care Home Management;Cryotherapy;Moist Heat;Iontophoresis 4mg /ml Dexamethasone;Therapeutic activities;Therapeutic exercise;Neuromuscular re-education;Manual techniques;Dry needling    PT Next Visit Plan  progress with AAROM; PROM; AROM as tolerated; work on thoracic extension and postural alignment; manual work    PT Home Exercise Plan  Access Code: [email or web address]    ( patient has a pool at her home )    Consulted and Agree with Plan of Care  Patient       Patient will benefit from skilled therapeutic intervention in order to improve the following deficits and impairments:  Postural dysfunction, Improper body mechanics, Pain, Increased fascial restricitons, Increased muscle spasms, Hypomobility, Decreased strength, Decreased mobility, Decreased range of motion, Decreased activity tolerance, Impaired UE functional use  Visit Diagnosis: Acute pain of left shoulder  Other symptoms and signs involving the musculoskeletal system  Abnormal posture     Problem List Patient Active Problem List   Diagnosis Date Noted  . Closed fracture of left proximal  humerus 09/26/2018  . Squamous cell carcinoma 03/05/2017  . Echocardiogram shows left ventricular diastolic dysfunction 62/83/1517  . Heart palpitations 07/25/2016  . Need for vaccination for pneumococcus 07/13/2016  . Osteoarthritis of hand, right 08/06/2015  . Diabetes mellitus type 2, uncomplicated ([place]) 61/60/7371  . Lung cancer, lower lobe ([place]) 10/09/2014  . Hematochezia 04/01/2013  . H/O colon cancer, stage II 09/19/2011  . Depression with anxiety 09/19/2011  . Hyperlipidemia 09/19/2011   Kerin Perna, PTA 11/12/18 1:06 PM  [place], [place], [address] Phone: [phone]   Fax:  [phone]  Name: Kelli [name] MRN: [id number] Date of Birth: 1945/07/24

## 2018-11-14 ENCOUNTER — Encounter: Payer: Medicare HMO | Admitting: Physical Therapy

## 2018-11-15 ENCOUNTER — Other Ambulatory Visit: Payer: Self-pay

## 2018-11-15 ENCOUNTER — Ambulatory Visit: Payer: Medicare HMO | Admitting: Physical Therapy

## 2018-11-15 DIAGNOSIS — R29898 Other symptoms and signs involving the musculoskeletal system: Secondary | ICD-10-CM | POA: Diagnosis not present

## 2018-11-15 DIAGNOSIS — M25512 Pain in left shoulder: Secondary | ICD-10-CM

## 2018-11-15 DIAGNOSIS — R293 Abnormal posture: Secondary | ICD-10-CM

## 2018-11-15 DIAGNOSIS — M6281 Muscle weakness (generalized): Secondary | ICD-10-CM

## 2018-11-15 NOTE — Therapy (Addendum)
[place], [place], [address] Phone: [phone]   Fax:  [phone]  Physical Therapy Treatment  Patient Details  Name: Kelli [name] MRN: [id number] Date of Birth: [birth year] Referring Provider (PT): Dr [name]    Encounter Date: 11/15/2018  PT End of Session - 11/15/18 1340    Visit Number  5   Number of Visits  24    Date for PT Re-Evaluation  01/21/19    PT Start Time  1333    PT Stop Time  1422    PT Time Calculation (min)  49 min    Activity Tolerance  Patient tolerated treatment well    Behavior During Therapy  [facility] for tasks assessed/performed       Past Medical History:  Diagnosis Date  . Anxiety   . Arthritis   . Borderline diabetic   . Colon cancer ([place])    colon ca dx 02/2009, lung cancer  . Depression   . Depression with anxiety 09/19/2011  . H/O colon cancer, stage II 09/19/2011   T3N0  Cecum Sept 2010  . High cholesterol   . Hyperlipidemia 09/19/2011  . Lung nodules 09/19/2011   granulomas  . Urinary urgency     Past Surgical History:  Procedure Laterality Date  . APPENDECTOMY    . BREAST SURGERY  1978   augmentation  . COLON SURGERY     2010  . CRYO INTERCOSTAL NERVE BLOCK N/A 10/05/2014   Procedure: CRYO INTERCOSTAL NERVE BLOCK;  Surgeon: [name], MD;  Location: [place];  Service: Thoracic;  Laterality: N/A;  . LYMPH NODE DISSECTION Right 10/05/2014   Procedure: LYMPH NODE DISSECTION;  Surgeon: [name], MD;  Location: [place];  Service: Thoracic;  Laterality: Right;  . ORIF HUMERUS FRACTURE Left 10/01/2018   Procedure: OPEN REDUCTION INTERNAL FIXATION (ORIF) LEFT PROXIMAL HUMERUS FRACTURE;  Surgeon: [name], MD;  Location: [place];  Service: Orthopedics;  Laterality: Left;  . PARTIAL COLECTOMY Right 2010  . SEGMENTECOMY Right 10/05/2014   Procedure: right lower lobe SEGMENTECTOMY;  Surgeon: [name], MD;  Location: [place];  Service: Thoracic;  Laterality: Right;  [name] VIDEO ASSISTED THORACOSCOPY (VATS)/WEDGE RESECTION Right 10/05/2014   Procedure: Right VIDEO ASSISTED THORACOSCOPY (VATS) with right lower lobe lung nodule WEDGE RESECTION;  Surgeon: [name], MD;  Location: [place];  Service: Thoracic;  Laterality: Right;    There were no vitals filed for this visit.  Subjective Assessment - 11/15/18 1343    Subjective  Pt reports no new changes since last visit.  She is now taking pain meds during day / night with improved sleep and mobility.    Pertinent History  arthritis; AODM; MI; lung and colon cancer - now cancer free    Patient Stated Goals  use arm Lt UE and get it moving again     Currently in Pain?  Yes    Pain Score  2     Pain Location  Shoulder    Pain Orientation  Left    Pain Descriptors / Indicators  Aching    Aggravating Factors   using LUE    Pain Relieving Factors  rest, using pool         [facility] PT Assessment - 11/15/18 0001      Assessment   Medical Diagnosis  Lt proximal humeral fracture     Referring Provider (PT)  Dr [name]  Onset Date/Surgical Date  10/01/18   fx - 09/23/18    Hand Dominance  Right    Next MD Visit  11/22/18    Prior Therapy  here for Rt shoulder       AROM   Left Shoulder External Rotation  5 Degrees   45 deg of supported scaption in supine      [facility] Adult PT Treatment/Exercise - 11/15/18 0001      Elbow Exercises   Elbow Flexion  10 reps   2 sets, cane with 1#, 2#     Shoulder Exercises: Supine   External Rotation  AAROM;10 reps   cane   Flexion  AAROM;Both;5 reps   press up with cane, towards ceiling   Other Supine Exercises  scap squeeze/thoracic lift x 5 sec x 5 reps      Shoulder Exercises: Seated   Row  AAROM;10 reps (2 sets) Cues to not elevate shoulder   with unilateral row then bilat row with cane.    Other Seated Exercises  circumduction holding cane in Lt hand x 10 each direction      Shoulder Exercises:  Pulleys   Flexion  --   10 sec hold x 10 reps    Scaption  --   10 sec hold x 5 reps      Shoulder Exercises: ROM/Strengthening   UBE (Upper Arm Bike)  L1: 45 sec each direction, RUE doing the work, LUE going for a ride       Shoulder Exercises: Chief Strategy Officer  --   w/ cane towel at side 10 sec x 10   Other Shoulder Stretches  extension with cane assist with Rt UE 5-10 sec hold x 10    Other Shoulder Stretches  IR sliding cane up back 5-10 sec hold x 10       Manual Therapy   Manual Therapy  Scapular mobilization;Soft tissue mobilization;Passive ROM;Myofascial release    Manual therapy comments  pt in hookliyng and Rt sidelying     Soft tissue mobilization  gentle STM to Lt subscap, lat, bicep and pec with elbow in neutral    Myofascial Release  Lt pec release    Scapular Mobilization  Lt scapula in all directions     Passive ROM  Lt shoulder ER (with elbow supported into scaption); scaption, flexion, ext - all to tissue limits and tolerance               PT Short Term Goals - 11/12/18 1305      PT SHORT TERM GOAL #1   Title  Instruct patient in improved posture and alignment with pt to demonstrate activation of posterior shoulder girdle musculature 12/10/2018    Period  Weeks    Status  On-going      PT SHORT TERM GOAL #2   Title  Independent in initial HEP 12/10/2018    Baseline  encouraged compliance    Time  6    Period  Weeks    Status  On-going      PT SHORT TERM GOAL #3   Title  Instruct patient in appropriate exercises for home pool 12/10/2018    Time  6    Period  Weeks    Status  Achieved        PT Long Term Goals - 10/29/18 1607      PT LONG TERM GOAL #1   Title  Improve posture and alignment with patient to demonstrate improved  upright posture with posterior shoulder girdle engaged 01/21/2019    Time  12    Period  Weeks    Status  New      PT LONG TERM GOAL #2   Title  Increase AROM Lt shoulder to ~ 120 degrees shoulder  elevation and 50-60 degrees ER and hand to sacrum IR 01/21/2019    Time  12    Period  Weeks    Status  New      PT LONG TERM GOAL #3   Title  Patient reports using Lt UE for functional activities including washing face; turning on/off light switch; etc 01/21/2019    Time  12    Period  Weeks    Status  New      PT LONG TERM GOAL #4   Title  Independent in HEP 01/21/2019    Time  12    Period  Weeks    Status  New      PT LONG TERM GOAL #5   Title  Improve FOTO =/< 42% limited by 01/21/2019    Time  12    Period  Weeks    Status  New            Plan - 11/15/18 1341    Clinical Impression Statement  Good progression of Lt shoulder ROM; improved tolerance of exercises and manual therapy.  Making gradual progress towards therapy goals.    Rehab Potential  Good    PT Frequency  2x / week    PT Duration  12 weeks    PT Treatment/Interventions  Patient/family education;ADLs/Self Care Home Management;Cryotherapy;Moist Heat;Iontophoresis 4mg /ml Dexamethasone;Therapeutic activities;Therapeutic exercise;Neuromuscular re-education;Manual techniques;Dry needling    PT Next Visit Plan  progress with AAROM; PROM; AROM as tolerated. MD note for upcoming visit.    PT Home Exercise Plan  Access Code: [email or web address]    ( patient has a pool at her home )    Consulted and Agree with Plan of Care  Patient       Patient will benefit from skilled therapeutic intervention in order to improve the following deficits and impairments:  Postural dysfunction, Improper body mechanics, Pain, Increased fascial restricitons, Increased muscle spasms, Hypomobility, Decreased strength, Decreased mobility, Decreased range of motion, Decreased activity tolerance, Impaired UE functional use  Visit Diagnosis: Acute pain of left shoulder  Other symptoms and signs involving the musculoskeletal system  Abnormal posture  Muscle weakness (generalized)     Problem List Patient Active Problem List   Diagnosis Date  Noted  . Closed fracture of left proximal humerus 09/26/2018  . Squamous cell carcinoma 03/05/2017  . Echocardiogram shows left ventricular diastolic dysfunction 62/26/3335  . Heart palpitations 07/25/2016  . Need for vaccination for pneumococcus 07/13/2016  . Osteoarthritis of hand, right 08/06/2015  . Diabetes mellitus type 2, uncomplicated ([place]) 45/62/5638  . Lung cancer, lower lobe ([place]) 10/09/2014  . Hematochezia 04/01/2013  . H/O colon cancer, stage II 09/19/2011  . Depression with anxiety 09/19/2011  . Hyperlipidemia 09/19/2011   Kerin Perna, PTA 11/15/18 2:38 PM  [place], [place], [address] Phone: [phone]   Fax:  [phone]  Name: GEORJEAN TOYA MRN: [id number] Date of Birth: 1946/06/05

## 2018-11-19 ENCOUNTER — Other Ambulatory Visit: Payer: Self-pay

## 2018-11-19 ENCOUNTER — Ambulatory Visit: Payer: Medicare HMO | Admitting: Physical Therapy

## 2018-11-19 DIAGNOSIS — R293 Abnormal posture: Secondary | ICD-10-CM | POA: Diagnosis not present

## 2018-11-19 DIAGNOSIS — M25512 Pain in left shoulder: Secondary | ICD-10-CM | POA: Diagnosis not present

## 2018-11-19 DIAGNOSIS — R29898 Other symptoms and signs involving the musculoskeletal system: Secondary | ICD-10-CM | POA: Diagnosis not present

## 2018-11-19 DIAGNOSIS — M6281 Muscle weakness (generalized): Secondary | ICD-10-CM

## 2018-11-19 NOTE — Therapy (Signed)
[place], [place], [address] Phone: [phone]   Fax:  [phone]  Physical Therapy Treatment  Patient Details  Name: Kelli [name] MRN: [id number] Date of Birth: 03/31/1946 Referring Provider (PT): Dr [name]    Encounter Date: 11/19/2018  PT End of Session - 11/19/18 1257    Visit Number  6    Number of Visits  24    Date for PT Re-Evaluation  01/21/19    PT Start Time  1205    PT Stop Time  1250    PT Time Calculation (min)  45 min    Activity Tolerance  Patient tolerated treatment well    Behavior During Therapy  [facility] for tasks assessed/performed       Past Medical History:  Diagnosis Date  . Anxiety   . Arthritis   . Borderline diabetic   . Colon cancer ([place])    colon ca dx 02/2009, lung cancer  . Depression   . Depression with anxiety 09/19/2011  . H/O colon cancer, stage II 09/19/2011   T3N0  Cecum Sept 2010  . High cholesterol   . Hyperlipidemia 09/19/2011  . Lung nodules 09/19/2011   granulomas  . Urinary urgency     Past Surgical History:  Procedure Laterality Date  . APPENDECTOMY    . BREAST SURGERY  1978   augmentation  . COLON SURGERY     2010  . CRYO INTERCOSTAL NERVE BLOCK N/A 10/05/2014   Procedure: CRYO INTERCOSTAL NERVE BLOCK;  Surgeon: [name], MD;  Location: [place];  Service: Thoracic;  Laterality: N/A;  . LYMPH NODE DISSECTION Right 10/05/2014   Procedure: LYMPH NODE DISSECTION;  Surgeon: [name], MD;  Location: [place];  Service: Thoracic;  Laterality: Right;  . ORIF HUMERUS FRACTURE Left 10/01/2018   Procedure: OPEN REDUCTION INTERNAL FIXATION (ORIF) LEFT PROXIMAL HUMERUS FRACTURE;  Surgeon: [name], MD;  Location: [place];  Service: Orthopedics;  Laterality: Left;  . PARTIAL COLECTOMY Right 2010  . SEGMENTECOMY Right 10/05/2014   Procedure: right lower lobe SEGMENTECTOMY;  Surgeon: [name], MD;  Location: [place];  Service: Thoracic;  Laterality: Right;  [name] VIDEO ASSISTED THORACOSCOPY (VATS)/WEDGE RESECTION Right 10/05/2014   Procedure: Right VIDEO ASSISTED THORACOSCOPY (VATS) with right lower lobe lung nodule WEDGE RESECTION;  Surgeon: [name], MD;  Location: [place];  Service: Thoracic;  Laterality: Right;    There were no vitals filed for this visit.  Subjective Assessment - 11/19/18 1303    Subjective  "I'm not feeling great today."  Pt reports increased soreness in Lt shoulder with rain.  She has tried to be more diligent with exercises.    Pertinent History  arthritis; AODM; MI; lung and colon cancer - now cancer free    Patient Stated Goals  use arm Lt UE and get it moving again     Currently in Pain?  No/denies    Pain Score  0-No pain   up to 6/10 with movement        [facility] PT Assessment - 11/19/18 1200      Assessment   Medical Diagnosis  Lt proximal humeral fracture     Referring Provider (PT)  Dr [name]     Onset Date/Surgical Date  10/01/18    Hand Dominance  Right    Next MD Visit  11/22/18    Prior Therapy  here for Rt shoulder  ROM / Strength   AROM / PROM / Strength  PROM;AROM      AROM   Right/Left Shoulder  Left   standing; except supine for ER   Right Shoulder Extension  31 Degrees    Right Shoulder Flexion  70 Degrees   standing   Right Shoulder ABduction  68 Degrees    Right Shoulder External Rotation  5 Degrees   supported scaption     PROM   PROM Assessment Site  Shoulder    Right/Left Shoulder  Left    Left Shoulder Flexion  102 Degrees      [facility] Adult PT Treatment/Exercise - 11/19/18 0001      Shoulder Exercises: Supine   External Rotation  AAROM;12 reps   cane   Flexion  AAROM;Both;5 reps   press up with cane, towards ceiling   Other Supine Exercises  scap squeeze/thoracic lift x 5 sec x 5 reps    Other Supine Exercises  AAROM Lt hand to forehead, Rt arm assisting x 8 reps       Shoulder Exercises: Seated   Row   AAROM;10 reps   with unilateral row then bilat row with cane.    Other Seated Exercises  pendulum x 10       Shoulder Exercises: Standing   Internal Rotation  10 reps;AAROM   side to side; up and over buttocks, with cane. 10 each   Flexion  Left;10 reps;AROM   mirror for feedback.    ABduction  Left;10 reps;AROM   mirror for feedback    Extension  AAROM;Both;12 reps   cane     Shoulder Exercises: Pulleys   Flexion  --   10 sec hold x 10 reps    Scaption  --   10 sec hold x 10 reps      Manual Therapy   Manual Therapy  Scapular mobilization;Soft tissue mobilization;Passive ROM;Myofascial release    Manual therapy comments  pt in hookliyng and Rt sidelying     Soft tissue mobilization  gentle STM to Lt subscap, lat, bicep and pec with elbow in neutral    Myofascial Release  Lt pec release    Scapular Mobilization  Lt scapula in all directions     Passive ROM  Lt shoulder ER (with elbow supported into scaption); scaption, flexion, ext - all to tissue limits and tolerance               PT Short Term Goals - 11/12/18 1305      PT SHORT TERM GOAL #1   Title  Instruct patient in improved posture and alignment with pt to demonstrate activation of posterior shoulder girdle musculature 12/10/2018    Period  Weeks    Status  On-going      PT SHORT TERM GOAL #2   Title  Independent in initial HEP 12/10/2018    Baseline  encouraged compliance    Time  6    Period  Weeks    Status  On-going      PT SHORT TERM GOAL #3   Title  Instruct patient in appropriate exercises for home pool 12/10/2018    Time  6    Period  Weeks    Status  Achieved        PT Long Term Goals - 10/29/18 1607      PT LONG TERM GOAL #1   Title  Improve posture and alignment with patient to demonstrate improved upright posture with posterior shoulder girdle  engaged 01/21/2019    Time  12    Period  Weeks    Status  New      PT LONG TERM GOAL #2   Title  Increase AROM Lt shoulder to ~ 120 degrees  shoulder elevation and 50-60 degrees ER and hand to sacrum IR 01/21/2019    Time  12    Period  Weeks    Status  New      PT LONG TERM GOAL #3   Title  Patient reports using Lt UE for functional activities including washing face; turning on/off light switch; etc 01/21/2019    Time  12    Period  Weeks    Status  New      PT LONG TERM GOAL #4   Title  Independent in HEP 01/21/2019    Time  12    Period  Weeks    Status  New      PT LONG TERM GOAL #5   Title  Improve FOTO =/< 42% limited by 01/21/2019    Time  12    Period  Weeks    Status  New            Plan - 11/19/18 1259    Clinical Impression Statement  Pt making steady gains in ROM each visit.  She reported pain up to 5/10 with AAROM exercises with Lt shoulder.  Pain resolved with rest.  Pt continues to be limited with functional ROM in Lt shoulder ROM; will benefit from continued PT intervention to max functional mobility and independence.    Rehab Potential  Good    PT Frequency  2x / week    PT Duration  12 weeks    PT Treatment/Interventions  Patient/family education;ADLs/Self Care Home Management;Cryotherapy;Moist Heat;Iontophoresis 4mg /ml Dexamethasone;Therapeutic activities;Therapeutic exercise;Neuromuscular re-education;Manual techniques;Dry needling    PT Next Visit Plan  progress with AAROM; PROM; AROM as tolerated. MD note for upcoming visit.    PT Home Exercise Plan  Access Code: [email or web address]    ( patient has a pool at her home )    Consulted and Agree with Plan of Care  Patient       Patient will benefit from skilled therapeutic intervention in order to improve the following deficits and impairments:  Postural dysfunction, Improper body mechanics, Pain, Increased fascial restricitons, Increased muscle spasms, Hypomobility, Decreased strength, Decreased mobility, Decreased range of motion, Decreased activity tolerance, Impaired UE functional use  Visit Diagnosis: 1. Acute pain of left shoulder   2. Other  symptoms and signs involving the musculoskeletal system   3. Abnormal posture   4. Muscle weakness (generalized)        Problem List Patient Active Problem List   Diagnosis Date Noted  . Closed fracture of left proximal humerus 09/26/2018  . Squamous cell carcinoma 03/05/2017  . Echocardiogram shows left ventricular diastolic dysfunction 52/77/8242  . Heart palpitations 07/25/2016  . Need for vaccination for pneumococcus 07/13/2016  . Osteoarthritis of hand, right 08/06/2015  . Diabetes mellitus type 2, uncomplicated ([place]) 35/36/1443  . Lung cancer, lower lobe ([place]) 10/09/2014  . Hematochezia 04/01/2013  . H/O colon cancer, stage II 09/19/2011  . Depression with anxiety 09/19/2011  . Hyperlipidemia 09/19/2011   Kerin Perna, PTA 11/19/18 1:06 PM  [place], [place], [address] Phone: [phone]   Fax:  [phone]  Name: Kelli [name] MRN: [id number] Date of Birth: May 27, 1946

## 2018-11-22 ENCOUNTER — Ambulatory Visit: Payer: Medicare HMO | Admitting: Physical Therapy

## 2018-11-22 ENCOUNTER — Ambulatory Visit (INDEPENDENT_AMBULATORY_CARE_PROVIDER_SITE_OTHER): Payer: Medicare HMO

## 2018-11-22 ENCOUNTER — Other Ambulatory Visit: Payer: Self-pay

## 2018-11-22 ENCOUNTER — Encounter: Payer: Self-pay | Admitting: Orthopedic Surgery

## 2018-11-22 ENCOUNTER — Ambulatory Visit (INDEPENDENT_AMBULATORY_CARE_PROVIDER_SITE_OTHER): Payer: Medicare HMO | Admitting: Orthopedic Surgery

## 2018-11-22 DIAGNOSIS — S42202D Unspecified fracture of upper end of left humerus, subsequent encounter for fracture with routine healing: Secondary | ICD-10-CM

## 2018-11-22 DIAGNOSIS — M25512 Pain in left shoulder: Secondary | ICD-10-CM

## 2018-11-22 DIAGNOSIS — S42202A Unspecified fracture of upper end of left humerus, initial encounter for closed fracture: Secondary | ICD-10-CM

## 2018-11-22 DIAGNOSIS — R293 Abnormal posture: Secondary | ICD-10-CM | POA: Diagnosis not present

## 2018-11-22 DIAGNOSIS — R29898 Other symptoms and signs involving the musculoskeletal system: Secondary | ICD-10-CM | POA: Diagnosis not present

## 2018-11-22 DIAGNOSIS — M6281 Muscle weakness (generalized): Secondary | ICD-10-CM | POA: Diagnosis not present

## 2018-11-22 NOTE — Progress Notes (Signed)
Post-Op Visit Note   Patient: Kelli [name]           Date of Birth: [birth year]           MRN: [id number] Visit Date: 11/22/2018 PCP: Emeterio Reeve, DO   Assessment & Plan:  Chief Complaint:  Chief Complaint  Patient presents with  . Left Shoulder - Fracture   Visit Diagnoses:  1. Closed fracture of proximal end of left humerus with routine healing, unspecified fracture morphology, subsequent encounter   2. Closed fracture of proximal end of left humerus, unspecified fracture morphology, initial encounter     Plan: Kelli [name] is a patient is a patient with left proximal humerus fracture 8 weeks out.  She had open reduction internal fixation of the injury.  She has been in physical therapy 2 times a week.  On exam she has improving range of motion but still below 90 degrees of abduction and forward flexion.  Incision is intact.  Plan is continue physical therapy 2 times a week for 4 more weeks and I will see her back for final check in 4 weeks just to check on range of motion.  Radiographs look good today.  Follow-Up Instructions: No follow-ups on file.   Orders:  Orders Placed This Encounter  Procedures  . XR Shoulder Left   No orders of the defined types were placed in this encounter.   Imaging: Xr Shoulder Left  Result Date: 11/22/2018 AP internal rotation external rotation left shoulder reviewed.  Greater tuberosity fracture without displacement is noted.  Otherwise no change from prior radiographs   PMFS History: Patient Active Problem List   Diagnosis Date Noted  . Closed fracture of left proximal humerus 09/26/2018  . Squamous cell carcinoma 03/05/2017  . Echocardiogram shows left ventricular diastolic dysfunction 11/94/1740  . Heart palpitations 07/25/2016  . Need for vaccination for pneumococcus 07/13/2016  . Osteoarthritis of hand, right 08/06/2015  . Diabetes mellitus type 2, uncomplicated ([place]) 81/44/8185  . Lung cancer, lower lobe ([place]) 10/09/2014  .  Hematochezia 04/01/2013  . H/O colon cancer, stage II 09/19/2011  . Depression with anxiety 09/19/2011  . Hyperlipidemia 09/19/2011   Past Medical History:  Diagnosis Date  . Anxiety   . Arthritis   . Borderline diabetic   . Colon cancer ([place])    colon ca dx 02/2009, lung cancer  . Depression   . Depression with anxiety 09/19/2011  . H/O colon cancer, stage II 09/19/2011   T3N0  Cecum Sept 2010  . High cholesterol   . Hyperlipidemia 09/19/2011  . Lung nodules 09/19/2011   granulomas  . Urinary urgency     Family History  Problem Relation Age of Onset  . Heart disease Mother   . Heart disease Brother     Past Surgical History:  Procedure Laterality Date  . APPENDECTOMY    . BREAST SURGERY  1978   augmentation  . COLON SURGERY     2010  . CRYO INTERCOSTAL NERVE BLOCK N/A 10/05/2014   Procedure: CRYO INTERCOSTAL NERVE BLOCK;  Surgeon: [name], MD;  Location: [place];  Service: Thoracic;  Laterality: N/A;  . LYMPH NODE DISSECTION Right 10/05/2014   Procedure: LYMPH NODE DISSECTION;  Surgeon: [name], MD;  Location: [place];  Service: Thoracic;  Laterality: Right;  . ORIF HUMERUS FRACTURE Left 10/01/2018   Procedure: OPEN REDUCTION INTERNAL FIXATION (ORIF) LEFT PROXIMAL HUMERUS FRACTURE;  Surgeon: [name], MD;  Location: [place];  Service: Orthopedics;  Laterality: Left;  . PARTIAL COLECTOMY Right 2010  . SEGMENTECOMY Right 10/05/2014   Procedure: right lower lobe SEGMENTECTOMY;  Surgeon: [name], MD;  Location: [place];  Service: Thoracic;  Laterality: Right;  [name] VIDEO ASSISTED THORACOSCOPY (VATS)/WEDGE RESECTION Right 10/05/2014   Procedure: Right VIDEO ASSISTED THORACOSCOPY (VATS) with right lower lobe lung nodule WEDGE RESECTION;  Surgeon: [name], MD;  Location: [place];  Service: Thoracic;  Laterality: Right;   Social History   Occupational History  . Not on file  Tobacco Use  . Smoking status: Former Smoker    Packs/day:  3.00    Years: 40.00    Pack years: 120.00    Types: Cigarettes    Quit date: 02/06/2009    Years since quitting: 9.7  . Smokeless tobacco: Never Used  Substance and Sexual Activity  . Alcohol use: Yes    Alcohol/week: 0.0 standard drinks    Comment: 3-4 times a week 1-2 beers  . Drug use: No  . Sexual activity: Never    Birth control/protection: Post-menopausal

## 2018-11-22 NOTE — Therapy (Signed)
[place], [place], [address] Phone: [phone]   Fax:  [phone]  Physical Therapy Treatment  Patient Details  Name: Kelli [name] MRN: [id number] Date of Birth: Jan 07, 1946 Referring Provider (PT): Dr [name]    Encounter Date: 11/22/2018  PT End of Session - 11/22/18 1453    Visit Number  7    Number of Visits  24    Date for PT Re-Evaluation  01/21/19    PT Start Time  6962    PT Stop Time  1420    PT Time Calculation (min)  45 min    Activity Tolerance  Patient tolerated treatment well    Behavior During Therapy  [facility] for tasks assessed/performed       Past Medical History:  Diagnosis Date  . Anxiety   . Arthritis   . Borderline diabetic   . Colon cancer ([place])    colon ca dx 02/2009, lung cancer  . Depression   . Depression with anxiety 09/19/2011  . H/O colon cancer, stage II 09/19/2011   T3N0  Cecum Sept 2010  . High cholesterol   . Hyperlipidemia 09/19/2011  . Lung nodules 09/19/2011   granulomas  . Urinary urgency     Past Surgical History:  Procedure Laterality Date  . APPENDECTOMY    . BREAST SURGERY  1978   augmentation  . COLON SURGERY     2010  . CRYO INTERCOSTAL NERVE BLOCK N/A 10/05/2014   Procedure: CRYO INTERCOSTAL NERVE BLOCK;  Surgeon: [name], MD;  Location: [facility];  Service: Thoracic;  Laterality: N/A;  . LYMPH NODE DISSECTION Right 10/05/2014   Procedure: LYMPH NODE DISSECTION;  Surgeon: [name], MD;  Location: [facility];  Service: Thoracic;  Laterality: Right;  . ORIF HUMERUS FRACTURE Left 10/01/2018   Procedure: OPEN REDUCTION INTERNAL FIXATION (ORIF) LEFT PROXIMAL HUMERUS FRACTURE;  Surgeon: [name], MD;  Location: [facility];  Service: Orthopedics;  Laterality: Left;  . PARTIAL COLECTOMY Right 2010  . SEGMENTECOMY Right 10/05/2014   Procedure: right lower lobe SEGMENTECTOMY;  Surgeon: [name], MD;  Location: [facility];  Service: Thoracic;  Laterality: Right;  [name] VIDEO ASSISTED THORACOSCOPY (VATS)/WEDGE RESECTION Right 10/05/2014   Procedure: Right VIDEO ASSISTED THORACOSCOPY (VATS) with right lower lobe lung nodule WEDGE RESECTION;  Surgeon: [name], MD;  Location: [facility];  Service: Thoracic;  Laterality: Right;    There were no vitals filed for this visit.  Subjective Assessment - 11/22/18 1343    Subjective  Pt had follow up with MD; he was pleased with her progress. she has script for 4 additional weeks of PT.    Pertinent History  arthritis; AODM; MI; lung and colon cancer - now cancer free    Patient Stated Goals  use arm Lt UE and get it moving again     Currently in Pain?  No/denies    Pain Score  0-No pain         [facility] PT Assessment - 11/22/18 0001      Assessment   Medical Diagnosis  Lt proximal humeral fracture     Referring Provider (PT)  Dr [name]     Onset Date/Surgical Date  10/01/18    Hand Dominance  Right    Next MD Visit  11/22/18    Prior Therapy  here for Rt shoulder       AROM   Left Shoulder  External Rotation  10 Degrees   45 deg of supported scaption in supine, cane AAROM        [facility] Adult PT Treatment/Exercise - 11/22/18 0001      Shoulder Exercises: Supine   External Rotation  AAROM;10 reps   cane   Flexion  AAROM;Both;5 reps    ABduction  AAROM;Left;10 reps   PTA assist, cane; scaption   Other Supine Exercises  scap squeeze/thoracic lift x 5 sec x 5 reps    Other Supine Exercises  AAROM Lt hand to forehead, Rt arm assisting x 5 reps       Shoulder Exercises: Pulleys   Flexion  --   10 sec hold x 10 reps    Scaption  --   10 sec hold x 10 reps      Shoulder Exercises: ROM/Strengthening   UBE (Upper Arm Bike)  L1: 45 sec each direction, RUE doing the work, LUE going for a ride       Shoulder Exercises: Stretch   Other Shoulder Stretches  extension with cane assist with Rt UE 5-10 sec hold x 10    Other Shoulder Stretches  Lt  shoulder ext stretch holding door frame x 20 sec x 2 reps (tactile cues to correct form)      Manual Therapy   Manual Therapy  Scapular mobilization;Soft tissue mobilization;Passive ROM;Myofascial release    Manual therapy comments  pt in hookliyng and Rt sidelying     Soft tissue mobilization  gentle STM to Lt subscap,levator, upper trap,  lat, bicep and pec with elbow in neutral    Myofascial Release  Lt pec release    Scapular Mobilization  Lt scapula in all directions     Passive ROM  Lt shoulder ER (with elbow supported into scaption); scaption, flexion, ext - all to tissue limits and tolerance               PT Short Term Goals - 11/12/18 1305      PT SHORT TERM GOAL #1   Title  Instruct patient in improved posture and alignment with pt to demonstrate activation of posterior shoulder girdle musculature 12/10/2018    Period  Weeks    Status  On-going      PT SHORT TERM GOAL #2   Title  Independent in initial HEP 12/10/2018    Baseline  encouraged compliance    Time  6    Period  Weeks    Status  On-going      PT SHORT TERM GOAL #3   Title  Instruct patient in appropriate exercises for home pool 12/10/2018    Time  6    Period  Weeks    Status  Achieved        PT Long Term Goals - 10/29/18 1607      PT LONG TERM GOAL #1   Title  Improve posture and alignment with patient to demonstrate improved upright posture with posterior shoulder girdle engaged 01/21/2019    Time  12    Period  Weeks    Status  New      PT LONG TERM GOAL #2   Title  Increase AROM Lt shoulder to ~ 120 degrees shoulder elevation and 50-60 degrees ER and hand to sacrum IR 01/21/2019    Time  12    Period  Weeks    Status  New      PT LONG TERM GOAL #3   Title  Patient reports using  Lt UE for functional activities including washing face; turning on/off light switch; etc 01/21/2019    Time  12    Period  Weeks    Status  New      PT LONG TERM GOAL #4   Title  Independent in HEP 01/21/2019     Time  12    Period  Weeks    Status  New      PT LONG TERM GOAL #5   Title  Improve FOTO =/< 42% limited by 01/21/2019    Time  12    Period  Weeks    Status  New            Plan - 11/22/18 1349    Clinical Impression Statement  Pt's ROM gradually improving, as well her tolerance for ROM exercises. Pt very point tender in subscap, lat and infraspinatus with STM.   Pt reporting improved compliance with HEP and remains motivated to progress. Pt will benefit from continued PT intervention to maximize functional mobility.    Rehab Potential  Good    PT Frequency  2x / week    PT Duration  12 weeks    PT Treatment/Interventions  Patient/family education;ADLs/Self Care Home Management;Cryotherapy;Moist Heat;Iontophoresis 4mg /ml Dexamethasone;Therapeutic activities;Therapeutic exercise;Neuromuscular re-education;Manual techniques;Dry needling    PT Next Visit Plan  progress with AAROM; PROM; AROM as tolerated. MD note for upcoming visit.    PT Home Exercise Plan  Access Code: [email or web address]    ( patient has a pool at her home )    Consulted and Agree with Plan of Care  Patient       Patient will benefit from skilled therapeutic intervention in order to improve the following deficits and impairments:  Postural dysfunction, Improper body mechanics, Pain, Increased fascial restricitons, Increased muscle spasms, Hypomobility, Decreased strength, Decreased mobility, Decreased range of motion, Decreased activity tolerance, Impaired UE functional use  Visit Diagnosis: 1. Acute pain of left shoulder   2. Other symptoms and signs involving the musculoskeletal system   3. Abnormal posture   4. Muscle weakness (generalized)        Problem List Patient Active Problem List   Diagnosis Date Noted  . Closed fracture of left proximal humerus 09/26/2018  . Squamous cell carcinoma 03/05/2017  . Echocardiogram shows left ventricular diastolic dysfunction 66/44/0347  . Heart palpitations 07/25/2016  .  Need for vaccination for pneumococcus 07/13/2016  . Osteoarthritis of hand, right 08/06/2015  . Diabetes mellitus type 2, uncomplicated ([place]) 42/59/5638  . Lung cancer, lower lobe ([place]) 10/09/2014  . Hematochezia 04/01/2013  . H/O colon cancer, stage II 09/19/2011  . Depression with anxiety 09/19/2011  . Hyperlipidemia 09/19/2011   Kerin Perna, PTA 11/22/18 2:59 PM  [place], [place], [address] Phone: [phone]   Fax:  [phone]  Name: OLUWASEUN BRUYERE MRN: [id number] Date of Birth: 1945-08-16

## 2018-11-26 ENCOUNTER — Encounter: Payer: Self-pay | Admitting: Family Medicine

## 2018-11-26 ENCOUNTER — Ambulatory Visit (INDEPENDENT_AMBULATORY_CARE_PROVIDER_SITE_OTHER): Payer: Medicare HMO | Admitting: Family Medicine

## 2018-11-26 ENCOUNTER — Encounter: Payer: Medicare HMO | Admitting: Rehabilitative and Restorative Service Providers"

## 2018-11-26 VITALS — BP 109/64 | HR 84 | Temp 98.2°F | Wt 174.0 lb

## 2018-11-26 DIAGNOSIS — F19982 Other psychoactive substance use, unspecified with psychoactive substance-induced sleep disorder: Secondary | ICD-10-CM | POA: Diagnosis not present

## 2018-11-26 DIAGNOSIS — R69 Illness, unspecified: Secondary | ICD-10-CM | POA: Diagnosis not present

## 2018-11-26 MED ORDER — CLONIDINE HCL 0.1 MG PO TABS: 0.1000 mg | ORAL_TABLET | Freq: Every day | ORAL | 2 refills | Status: DC

## 2018-11-26 NOTE — Progress Notes (Signed)
Kelli [name] is a 73 y.o. female who presents to [facility] today for jitteriness at night.  Patient suffered a left humeral head fracture repaired with ORIF in April.  She has been slowly improving under the care of orthopedics.  She has been prescribed opiates for pain control.  She notes her pain is largely pretty well controlled.  However at bedtime when she does not take her hydrocodone she feels very jittery and has trouble sleeping.  She is worried that she may be having trouble weaning off of the hydrocodone.  She has largely her pain is pretty well controlled occasionally she will use hydrocodone during the day for pain.  She continues physical therapy and is making slow progress.  She largely does not have much pain at bedtime.  She denies any nausea or sweating.  She denies any history previously of difficulty sleeping.   ROS as above:  Exam:  BP 109/64   Pulse 84   Temp 98.2 F (36.8 C) (Oral)   Wt 174 lb (78.9 kg)   LMP  (LMP Unknown)   BMI 26.46 kg/m  Wt Readings from Last 5 Encounters:  11/26/18 174 lb (78.9 kg)  10/01/18 181 lb 3.2 oz (82.2 kg)  09/11/18 181 lb 3.2 oz (82.2 kg)  08/13/18 179 lb 11.2 oz (81.5 kg)  07/10/18 177 lb (80.3 kg)    Gen: Well NAD HEENT: EOMI,  MMM Lungs: Normal work of breathing. CTABL Heart: RRR no MRG Abd: NABS, Soft. Nondistended, Nontender Exts: Brisk capillary refill, warm and well perfused.  Skin: Left shoulder well-appearing scar. MSK: Decreased shoulder motion with limited abduction and external rotation. Psych: Alert and oriented with speech thought process and affect.  Lab and Radiology Results Dg Humerus Left  Result Date: 10/01/2018 CLINICAL DATA:  ORIF proximal LEFT humerus EXAM: LEFT HUMERUS - 2+ VIEW; DG C-ARM 61-120 MIN COMPARISON:  None FLUOROSCOPY TIME:  1 minutes 15 seconds Images submitted: 4  FINDINGS: Osseous demineralization. AC joint alignment normal. First image demonstrates comminuted fracture of the proximal LEFT humerus, extending from surgical neck into greater tuberosity/head. Subsequent images demonstrate placement of a plate and multiple screws at the proximal LEFT humerus with additional cerclage wire noted. No gross evidence of dislocation. IMPRESSION: Post ORIF of proximal LEFT humerus. Electronically Signed   By: Lavonia Dana M.D.   On: 10/01/2018 19:40   Dg C-arm 1-60 Min  Result Date: 10/01/2018 CLINICAL DATA:  ORIF proximal LEFT humerus EXAM: LEFT HUMERUS - 2+ VIEW; DG C-ARM 61-120 MIN COMPARISON:  None FLUOROSCOPY TIME:  1 minutes 15 seconds Images submitted: 4 FINDINGS: Osseous demineralization. AC joint alignment normal. First image demonstrates comminuted fracture of the proximal LEFT humerus, extending from surgical neck into greater tuberosity/head. Subsequent images demonstrate placement of a plate and multiple screws at the proximal LEFT humerus with additional cerclage wire noted. No gross evidence of dislocation. IMPRESSION: Post ORIF of proximal LEFT humerus. Electronically Signed   By: Lavonia Dana M.D.   On: 10/01/2018 19:40   Xr Shoulder Left  Result Date: 11/22/2018 3 views left proximal humerus fracture and shoulder joint reviewed.  Fracture hardware unchanged in appearance.  No evidence of collapse of the humeral head.  Shoulder is located.  Xr Shoulder Left  Result Date: 10/25/2018 2 views left shoulder reviewed.  Proximal humerus fracture plate fixation in good position alignment with no complicating features.  Shoulder is located.  No change from  prior radiographs  Xr Shoulder Left  Result Date: 10/11/2018 2 views left shoulder reveal hardware in good position alignment with no evidence of complication.  Proximal humerus fracture in reasonably good position.  Tuberosity is approximately at the level of the superior aspect of the humeral head.  No  evidence of AVN.  I personally (independently) visualized and performed the interpretation of the images attached in this note.    Assessment and Plan: 73 y.o. female with jitteriness and insomnia.  Concerning for opiate withdrawal.  Patient has been on opiates now for about a month and 1/2 to 2 months.  She is having jitteriness symptoms especially at bedtime when she does not take her hydrocodone.  She is aware of this and would like to wean off further.  Plan to use limited clonidine at bedtime.  This should help with some of the withdrawal symptoms and possibly help with insomnia as well.  If not sufficient or not improving next up would likely be trazodone.  She will communicate with me if not improving.  CC Dr. [name] orthopedics  PDMP reviewed during this encounter. No orders of the defined types were placed in this encounter.  Meds ordered this encounter  Medications  . cloNIDine (CATAPRES) 0.1 MG tablet    Sig: Take 1 tablet (0.1 mg total) by mouth at bedtime.    Dispense:  30 tablet    Refill:  2     Historical information moved to improve visibility of documentation.  Past Medical History:  Diagnosis Date  . Anxiety   . Arthritis   . Borderline diabetic   . Colon cancer ([place])    colon ca dx 02/2009, lung cancer  . Depression   . Depression with anxiety 09/19/2011  . H/O colon cancer, stage II 09/19/2011   T3N0  Cecum Sept 2010  . High cholesterol   . Hyperlipidemia 09/19/2011  . Lung nodules 09/19/2011   granulomas  . Urinary urgency    Past Surgical History:  Procedure Laterality Date  . APPENDECTOMY    . BREAST SURGERY  1978   augmentation  . COLON SURGERY     2010  . CRYO INTERCOSTAL NERVE BLOCK N/A 10/05/2014   Procedure: CRYO INTERCOSTAL NERVE BLOCK;  Surgeon: [name], MD;  Location: [facility];  Service: Thoracic;  Laterality: N/A;  . LYMPH NODE DISSECTION Right 10/05/2014   Procedure: LYMPH NODE DISSECTION;  Surgeon: [name], MD;   Location: [facility];  Service: Thoracic;  Laterality: Right;  . ORIF HUMERUS FRACTURE Left 10/01/2018   Procedure: OPEN REDUCTION INTERNAL FIXATION (ORIF) LEFT PROXIMAL HUMERUS FRACTURE;  Surgeon: [name], MD;  Location: [facility];  Service: Orthopedics;  Laterality: Left;  . PARTIAL COLECTOMY Right 2010  . SEGMENTECOMY Right 10/05/2014   Procedure: right lower lobe SEGMENTECTOMY;  Surgeon: [name], MD;  Location: [facility];  Service: Thoracic;  Laterality: Right;  [name] VIDEO ASSISTED THORACOSCOPY (VATS)/WEDGE RESECTION Right 10/05/2014   Procedure: Right VIDEO ASSISTED THORACOSCOPY (VATS) with right lower lobe lung nodule WEDGE RESECTION;  Surgeon: [name], MD;  Location: [facility];  Service: Thoracic;  Laterality: Right;   Social History   Tobacco Use  . Smoking status: Former Smoker    Packs/day: 3.00    Years: 40.00    Pack years: 120.00    Types: Cigarettes    Quit date: 02/06/2009    Years since quitting: 9.8  . Smokeless tobacco: Never Used  Substance Use Topics  . Alcohol  use: Yes    Alcohol/week: 0.0 standard drinks    Comment: 3-4 times a week 1-2 beers   family history includes Heart disease in her brother and mother.  Medications: Current Outpatient Medications  Medication Sig Dispense Refill  . AMBULATORY NON FORMULARY MEDICATION Single glucometer with lancets, test strips. Test daily.  Disp qs 3 months E11.9 1 each 0  . aspirin 81 MG tablet Take 81 mg by mouth daily with lunch.     . B Complex-C (B-COMPLEX WITH VITAMIN C) tablet Take 1 tablet by mouth daily with lunch.    . Cholecalciferol (VITAMIN D) 2000 UNITS tablet Take 2,000 Units by mouth daily with lunch.     [name] FLUoxetine (PROZAC) 40 MG capsule Take 1 capsule (40 mg total) by mouth daily. (Patient taking differently: Take 40 mg by mouth daily with lunch. ) 90 capsule 3  . HYDROcodone-acetaminophen (NORCO/VICODIN) 5-325 MG tablet 1 po q 8-12 hrs prn pain 30 tablet 0  . methocarbamol (ROBAXIN) 500  MG tablet Take 1 tablet (500 mg total) by mouth at bedtime as needed for muscle spasms. 20 tablet 0  . ondansetron (ZOFRAN) 4 MG tablet Take 1 tablet (4 mg total) by mouth every 6 (six) hours as needed for nausea or vomiting. 20 tablet 0  . pravastatin (PRAVACHOL) 80 MG tablet Take 1 tablet (80 mg total) by mouth daily. (Patient taking differently: Take 80 mg by mouth daily with lunch. ) 90 tablet 3  . cloNIDine (CATAPRES) 0.1 MG tablet Take 1 tablet (0.1 mg total) by mouth at bedtime. 30 tablet 2  . metFORMIN (GLUCOPHAGE-XR) 500 MG 24 hr tablet Take 2 tablets (1,000 mg total) by mouth daily with breakfast. (Patient taking differently: Take 1,000 mg by mouth daily with lunch. ) 180 tablet 3   No current facility-administered medications for this visit.    Allergies  Allergen Reactions  . Atorvastatin Other (See Comments)    Muscle cramps  . Glipizide Er Other (See Comments)    Shaking / tingling in fingers      Discussed warning signs or symptoms. Please see discharge instructions. Patient expresses understanding.

## 2018-11-26 NOTE — Patient Instructions (Signed)
Thank you for coming in today.  Take clonidine at night to help with what I think is opiate withdraw and insomnia.  If not working let me know. I will likely prescribe trazodone for insomnia.   Clonidine tablets What is this medicine? CLONIDINE (KLOE ni deen) is used to treat high blood pressure. This medicine may be used for other purposes; ask your health care provider or pharmacist if you have questions. COMMON BRAND NAME(S): Catapres What should I tell my health care provider before I take this medicine? They need to know if you have any of these conditions: -kidney disease -an unusual or allergic reaction to clonidine, other medicines, foods, dyes, or preservatives -pregnant or trying to get pregnant -breast-feeding How should I use this medicine? Take this medicine by mouth with a glass of water. Follow the directions on the prescription label. Take your doses at regular intervals. Do not take your medicine more often than directed. Do not suddenly stop taking this medicine. You must gradually reduce the dose or you may get a dangerous increase in blood pressure. Ask your doctor or health care professional for advice. Talk to your pediatrician regarding the use of this medicine in children. Special care may be needed. Overdosage: If you think you have taken too much of this medicine contact a poison control center or emergency room at once. NOTE: This medicine is only for you. Do not share this medicine with others. What if I miss a dose? If you miss a dose, take it as soon as you can. If it is almost time for your next dose, take only that dose. Do not take double or extra doses. What may interact with this medicine? Do not take this medicine with any of the following medications: -MAOIs like Carbex, Eldepryl, Marplan, Nardil, and Parnate This medicine may also interact with the following medications: -barbiturate medicines for inducing sleep or treating seizures like  phenobarbital -certain medicines for blood pressure, heart disease, irregular heart beat -certain medicines for depression, anxiety, or psychotic disturbances -prescription pain medicines This list may not describe all possible interactions. Give your health care provider a list of all the medicines, herbs, non-prescription drugs, or dietary supplements you use. Also tell them if you smoke, drink alcohol, or use illegal drugs. Some items may interact with your medicine. What should I watch for while using this medicine? Visit your doctor or health care professional for regular checks on your progress. Check your heart rate and blood pressure regularly while you are taking this medicine. Ask your doctor or health care professional what your heart rate should be and when you should contact him or her. You may get drowsy or dizzy. Do not drive, use machinery, or do anything that needs mental alertness until you know how this medicine affects you. To avoid dizzy or fainting spells, do not stand or sit up quickly, especially if you are an older person. Alcohol can make you more drowsy and dizzy. Avoid alcoholic drinks. Your mouth may get dry. Chewing sugarless gum or sucking hard candy, and drinking plenty of water will help. Do not treat yourself for coughs, colds, or pain while you are taking this medicine without asking your doctor or health care professional for advice. Some ingredients may increase your blood pressure. If you are going to have surgery tell your doctor or health care professional that you are taking this medicine. What side effects may I notice from receiving this medicine? Side effects that you should report to your  doctor or health care professional as soon as possible: -allergic reactions like skin rash, itching or hives, swelling of the face, lips, or tongue -anxiety, nervousness -chest pain -depression -fast, irregular heartbeat -swelling of feet or legs -unusually weak or  tired Side effects that usually do not require medical attention (report to your doctor or health care professional if they continue or are bothersome): -change in sex drive or performance -constipation -headache This list may not describe all possible side effects. Call your doctor for medical advice about side effects. You may report side effects to FDA at 1-800-FDA-1088. Where should I keep my medicine? Keep out of the reach of children. Store at room temperature between 15 and 30 degrees C (59 and 86 degrees F). Protect from light. Keep container tightly closed. Throw away any unused medicine after the expiration date. NOTE: This sheet is a summary. It may not cover all possible information. If you have questions about this medicine, talk to your doctor, pharmacist, or health care provider.  2019 Elsevier/Gold Standard (2010-11-16 13:01:28)

## 2018-11-27 ENCOUNTER — Encounter: Payer: Self-pay | Admitting: Family Medicine

## 2018-11-27 MED ORDER — TRAZODONE HCL 50 MG PO TABS: 25.0000 mg | ORAL_TABLET | Freq: Every evening | ORAL | 3 refills | Status: DC | PRN

## 2018-11-28 ENCOUNTER — Telehealth: Payer: Self-pay

## 2018-11-28 NOTE — Telephone Encounter (Signed)
Courtney from [facility] called regarding bone density testing. Wants to know if a referral order has been placed for the pt or if pt completed testing for this year? Pls advise, thanks.

## 2018-11-29 ENCOUNTER — Encounter: Payer: Medicare HMO | Admitting: Physical Therapy

## 2018-11-29 NOTE — Telephone Encounter (Signed)
Kelli [name] states her insurance company has already set her up for an in home Dexa for July 6th.

## 2018-11-29 NOTE — Telephone Encounter (Signed)
No recrd of recent DEXA Can we call patient and see if she's ok with getting bone density test? Recommended for anyone over 50 w/ fracture

## 2018-12-02 MED ORDER — QUETIAPINE FUMARATE 25 MG PO TABS: 25.0000 mg | ORAL_TABLET | Freq: Every evening | ORAL | 1 refills | Status: DC | PRN

## 2018-12-02 NOTE — Telephone Encounter (Signed)
Didn't know they did those. Thanks

## 2018-12-02 NOTE — Addendum Note (Signed)
Addended by: Gregor Hams on: 12/02/2018 10:57 AM   Modules accepted: Orders

## 2018-12-03 ENCOUNTER — Other Ambulatory Visit: Payer: Self-pay

## 2018-12-03 ENCOUNTER — Ambulatory Visit: Payer: Medicare HMO | Admitting: Physical Therapy

## 2018-12-03 DIAGNOSIS — M6281 Muscle weakness (generalized): Secondary | ICD-10-CM

## 2018-12-03 DIAGNOSIS — R29898 Other symptoms and signs involving the musculoskeletal system: Secondary | ICD-10-CM

## 2018-12-03 DIAGNOSIS — M25512 Pain in left shoulder: Secondary | ICD-10-CM | POA: Diagnosis not present

## 2018-12-03 DIAGNOSIS — R293 Abnormal posture: Secondary | ICD-10-CM

## 2018-12-03 NOTE — Therapy (Signed)
[place] [address] [place], [place], [address] Phone: [phone]   Fax:  [phone]  Physical Therapy Treatment  Patient Details  Name: Kelli [name] MRN: [id number] Date of Birth: January 17, 1946 Referring Provider (PT): Dr [name]    Encounter Date: 12/03/2018  PT End of Session - 12/03/18 1109    Visit Number  8    Number of Visits  24    Date for PT Re-Evaluation  01/21/19    PT Start Time  1107   pt arrived late   PT Stop Time  1150    PT Time Calculation (min)  43 min    Activity Tolerance  --    Behavior During Therapy  [facility] for tasks assessed/performed       Past Medical History:  Diagnosis Date  . Anxiety   . Arthritis   . Borderline diabetic   . Colon cancer ([place])    colon ca dx 02/2009, lung cancer  . Depression   . Depression with anxiety 09/19/2011  . H/O colon cancer, stage II 09/19/2011   T3N0  Cecum Sept 2010  . High cholesterol   . Hyperlipidemia 09/19/2011  . Lung nodules 09/19/2011   granulomas  . Urinary urgency     Past Surgical History:  Procedure Laterality Date  . APPENDECTOMY    . BREAST SURGERY  1978   augmentation  . COLON SURGERY     2010  . CRYO INTERCOSTAL NERVE BLOCK N/A 10/05/2014   Procedure: CRYO INTERCOSTAL NERVE BLOCK;  Surgeon: [name], MD;  Location: [facility];  Service: Thoracic;  Laterality: N/A;  . LYMPH NODE DISSECTION Right 10/05/2014   Procedure: LYMPH NODE DISSECTION;  Surgeon: [name], MD;  Location: [facility];  Service: Thoracic;  Laterality: Right;  . ORIF HUMERUS FRACTURE Left 10/01/2018   Procedure: OPEN REDUCTION INTERNAL FIXATION (ORIF) LEFT PROXIMAL HUMERUS FRACTURE;  Surgeon: [name], MD;  Location: [facility];  Service: Orthopedics;  Laterality: Left;  . PARTIAL COLECTOMY Right 2010  . SEGMENTECOMY Right 10/05/2014   Procedure: right lower lobe SEGMENTECTOMY;  Surgeon: [name], MD;  Location: [facility];   Service: Thoracic;  Laterality: Right;  [name] VIDEO ASSISTED THORACOSCOPY (VATS)/WEDGE RESECTION Right 10/05/2014   Procedure: Right VIDEO ASSISTED THORACOSCOPY (VATS) with right lower lobe lung nodule WEDGE RESECTION;  Surgeon: [name], MD;  Location: [facility];  Service: Thoracic;  Laterality: Right;    There were no vitals filed for this visit.  Subjective Assessment - 12/03/18 1116    Subjective  Pt reports she has not been able to sleep well for last 2 wks. She has stopped pain medication because she feels it may be limiting her sleep. She states she hasn't done her exercises because she hasn't been up to it.    Pertinent History  arthritis; AODM; MI; lung and colon cancer - now cancer free    Currently in Pain?  No/denies    Pain Score  0-No pain    Pain Location  Shoulder    Pain Orientation  Left         [facility] PT Assessment - 12/03/18 0001      Assessment   Medical Diagnosis  Lt proximal humeral fracture     Referring Provider (PT)  Dr [name]     Onset Date/Surgical Date  10/01/18    Hand Dominance  Right    Next MD Visit  7/20  Prior Therapy  here for Rt shoulder       AROM   Right/Left Shoulder  Left    Left Shoulder External Rotation  7 Degrees   45 deg of supported scaption in supine, cane AAROM     PROM   Right/Left Shoulder  Left    Left Shoulder Flexion  95 Degrees       [facility] Adult PT Treatment/Exercise - 12/03/18 0001      Shoulder Exercises: Supine   External Rotation  AAROM;10 reps   cane   Flexion  AAROM;Both;10 reps   cane   ABduction  AAROM;Left;10 reps   PTA assist, cane; scaption   Other Supine Exercises  scap squeeze/thoracic lift x 5 sec x 5 reps    Other Supine Exercises  AAROM Lt hand to forehead, Rt arm assisting x 5 reps       Shoulder Exercises: Pulleys   Flexion  --   10 sec hold x 10 reps    Scaption  --   10 sec hold x 10 reps      Shoulder Exercises: ROM/Strengthening   UBE (Upper Arm Bike)  L1: 60 sec each  direction, RUE doing the work, LUE going for a ride       Shoulder Exercises: Stretch   Other Shoulder Stretches  Lt shoulder ext stretch holding counter x 20 sec x 4 reps (tactile cues to correct form)      Modalities   Modalities  --   declined; will use at home.      Manual Therapy   Manual therapy comments  pt in hookliyng and Rt sidelying     Soft tissue mobilization  gentle STM to Lt subscap,levator, upper trap,  lat, bicep and pec with elbow in neutral    Myofascial Release  Lt pec release    Scapular Mobilization  Lt scapula in all directions                PT Short Term Goals - 11/12/18 1305      PT SHORT TERM GOAL #1   Title  Instruct patient in improved posture and alignment with pt to demonstrate activation of posterior shoulder girdle musculature 12/10/2018    Period  Weeks    Status  On-going      PT SHORT TERM GOAL #2   Title  Independent in initial HEP 12/10/2018    Baseline  encouraged compliance    Time  6    Period  Weeks    Status  On-going      PT SHORT TERM GOAL #3   Title  Instruct patient in appropriate exercises for home pool 12/10/2018    Time  6    Period  Weeks    Status  Achieved        PT Long Term Goals - 10/29/18 1607      PT LONG TERM GOAL #1   Title  Improve posture and alignment with patient to demonstrate improved upright posture with posterior shoulder girdle engaged 01/21/2019    Time  12    Period  Weeks    Status  New      PT LONG TERM GOAL #2   Title  Increase AROM Lt shoulder to ~ 120 degrees shoulder elevation and 50-60 degrees ER and hand to sacrum IR 01/21/2019    Time  12    Period  Weeks    Status  New      PT LONG TERM GOAL #  3   Title  Patient reports using Lt UE for functional activities including washing face; turning on/off light switch; etc 01/21/2019    Time  12    Period  Weeks    Status  New      PT LONG TERM GOAL #4   Title  Independent in HEP 01/21/2019    Time  12    Period  Weeks    Status  New       PT LONG TERM GOAL #5   Title  Improve FOTO =/< 42% limited by 01/21/2019    Time  12    Period  Weeks    Status  New            Plan - 12/03/18 1119    Clinical Impression Statement  Pt reporting "9/10" pain with AAROM exercises today; reduced/eliminated with rest in between exercises.  Lt shoulder ROM slightly less than last visit. Pt encouraged to be more compliant with HEP to assist with improved ROM and mobility.  Goals are ongoing.    Rehab Potential  Good    PT Frequency  2x / week    PT Duration  12 weeks    PT Treatment/Interventions  Patient/family education;ADLs/Self Care Home Management;Cryotherapy;Moist Heat;Iontophoresis 4mg /ml Dexamethasone;Therapeutic activities;Therapeutic exercise;Neuromuscular re-education;Manual techniques;Dry needling    PT Next Visit Plan  progress with AAROM; PROM; AROM as tolerated. MD note for upcoming visit.    PT Home Exercise Plan  Access Code: [email or web address]    ( patient has a pool at her home )    Consulted and Agree with Plan of Care  Patient       Patient will benefit from skilled therapeutic intervention in order to improve the following deficits and impairments:  Postural dysfunction, Improper body mechanics, Pain, Increased fascial restricitons, Increased muscle spasms, Hypomobility, Decreased strength, Decreased mobility, Decreased range of motion, Decreased activity tolerance, Impaired UE functional use  Visit Diagnosis: 1. Acute pain of left shoulder   2. Other symptoms and signs involving the musculoskeletal system   3. Abnormal posture   4. Muscle weakness (generalized)        Problem List Patient Active Problem List   Diagnosis Date Noted  . Closed fracture of left proximal humerus 09/26/2018  . Squamous cell carcinoma 03/05/2017  . Echocardiogram shows left ventricular diastolic dysfunction 62/69/4854  . Heart palpitations 07/25/2016  . Need for vaccination for pneumococcus 07/13/2016  . Osteoarthritis of hand,  right 08/06/2015  . Diabetes mellitus type 2, uncomplicated ([place]) 62/70/3500  . Lung cancer, lower lobe ([place]) 10/09/2014  . Hematochezia 04/01/2013  . H/O colon cancer, stage II 09/19/2011  . Depression with anxiety 09/19/2011  . Hyperlipidemia 09/19/2011   Kerin Perna, PTA 12/03/18 12:54 PM  [place], [place], [address] Phone: [phone]   Fax:  [phone]  Name: Kelli [name] MRN: [id number] Date of Birth: 08-04-1945

## 2018-12-05 ENCOUNTER — Encounter: Payer: Self-pay | Admitting: Rehabilitative and Restorative Service Providers"

## 2018-12-05 ENCOUNTER — Ambulatory Visit: Payer: Medicare HMO | Admitting: Rehabilitative and Restorative Service Providers"

## 2018-12-05 ENCOUNTER — Other Ambulatory Visit: Payer: Self-pay

## 2018-12-05 DIAGNOSIS — M6281 Muscle weakness (generalized): Secondary | ICD-10-CM

## 2018-12-05 DIAGNOSIS — M25512 Pain in left shoulder: Secondary | ICD-10-CM | POA: Diagnosis not present

## 2018-12-05 DIAGNOSIS — R293 Abnormal posture: Secondary | ICD-10-CM

## 2018-12-05 DIAGNOSIS — R29898 Other symptoms and signs involving the musculoskeletal system: Secondary | ICD-10-CM

## 2018-12-05 MED ORDER — GABAPENTIN 300 MG PO CAPS: 300.0000 mg | ORAL_CAPSULE | Freq: Every evening | ORAL | 2 refills | Status: DC | PRN

## 2018-12-05 NOTE — Therapy (Signed)
[place], [place], [address] Phone: [phone]   Fax:  [phone]  Physical Therapy Treatment  Patient Details  Name: Kelli [name] MRN: [id number] Date of Birth: [birth year] Referring Provider (PT): Dr [name]    Encounter Date: 12/05/2018  PT End of Session - 12/05/18 1359    Visit Number  9    Number of Visits  24    Date for PT Re-Evaluation  01/21/19    PT Start Time  7622    PT Stop Time  1447    PT Time Calculation (min)  48 min    Activity Tolerance  Patient tolerated treatment well       Past Medical History:  Diagnosis Date  . Anxiety   . Arthritis   . Borderline diabetic   . Colon cancer ([place])    colon ca dx 02/2009, lung cancer  . Depression   . Depression with anxiety 09/19/2011  . H/O colon cancer, stage II 09/19/2011   T3N0  Cecum Sept 2010  . High cholesterol   . Hyperlipidemia 09/19/2011  . Lung nodules 09/19/2011   granulomas  . Urinary urgency     Past Surgical History:  Procedure Laterality Date  . APPENDECTOMY    . BREAST SURGERY  1978   augmentation  . COLON SURGERY     2010  . CRYO INTERCOSTAL NERVE BLOCK N/A 10/05/2014   Procedure: CRYO INTERCOSTAL NERVE BLOCK;  Surgeon: [name], MD;  Location: [facility];  Service: Thoracic;  Laterality: N/A;  . LYMPH NODE DISSECTION Right 10/05/2014   Procedure: LYMPH NODE DISSECTION;  Surgeon: [name], MD;  Location: [facility];  Service: Thoracic;  Laterality: Right;  . ORIF HUMERUS FRACTURE Left 10/01/2018   Procedure: OPEN REDUCTION INTERNAL FIXATION (ORIF) LEFT PROXIMAL HUMERUS FRACTURE;  Surgeon: [name], MD;  Location: [facility];  Service: Orthopedics;  Laterality: Left;  . PARTIAL COLECTOMY Right 2010  . SEGMENTECOMY Right 10/05/2014   Procedure: right lower lobe SEGMENTECTOMY;  Surgeon: [name], MD;  Location: [facility];  Service: Thoracic;  Laterality: Right;  [name] VIDEO ASSISTED  THORACOSCOPY (VATS)/WEDGE RESECTION Right 10/05/2014   Procedure: Right VIDEO ASSISTED THORACOSCOPY (VATS) with right lower lobe lung nodule WEDGE RESECTION;  Surgeon: [name], MD;  Location: [facility];  Service: Thoracic;  Laterality: Right;    There were no vitals filed for this visit.  Subjective Assessment - 12/05/18 1400    Subjective  Patient reports that her shoulder is hurting less - does not hurt all the time now, just when she is using arm or doing exercises.She is working on her shoulder in the pool. She is still not sleeping well at all.    Currently in Pain?  Yes    Pain Score  3     Pain Location  Shoulder    Pain Orientation  Left    Pain Descriptors / Indicators  Aching    Pain Type  Acute pain    Pain Onset  More than a month ago    Pain Frequency  Intermittent                       [facility] Adult PT Treatment/Exercise - 12/05/18 0001      Shoulder Exercises: Supine   External Rotation  AAROM;10 reps   cane   Flexion  AAROM;Both;10 reps   cane   Other Supine Exercises  scap squeeze/thoracic lift x 5 sec x 5 reps    Other Supine Exercises  AAROM Lt hand to forehead, Rt arm assisting x 5 reps       Shoulder Exercises: Standing   Internal Rotation  AAROM;Left   assisting w/ cane then clasping hands bringing hands up back   Extension  AAROM;Left;Right;10 reps   cane behind back - bringing hands closer together     Shoulder Exercises: Pulleys   Flexion  --   10 sec hold x 10 reps    Scaption  --   10 sec hold x 10 reps      Shoulder Exercises: Therapy Ball   Flexion  10 reps;Both   rolling ball forward on floor to work on shoulder flexion      Shoulder Exercises: Stretch   External Rotation Stretch  5 reps;10 seconds   2 sets - sitting elbow at side using cane    Table Stretch - Flexion  5 reps;10 seconds   facing table 10 sec    Table Stretch - Abduction  5 reps;10 seconds   2 sets table at Lt side      Manual Therapy   Manual  therapy comments  pt supine LE on bolster     Soft tissue mobilization  working through the Lt shoulder girdle - anterior deltoid/pecs; deltoid; posterior shoulder/teres     Passive ROM  Lt shoulder flexion; ER (in scaption); scaption, - to tissue limits and tolerance             PT Education - 12/05/18 1455    Education Details  HEP    Person(s) Educated  Patient    Methods  Explanation;Demonstration;Tactile cues;Verbal cues;Handout    Comprehension  Verbalized understanding;Returned demonstration;Verbal cues required;Tactile cues required       PT Short Term Goals - 11/12/18 1305      PT SHORT TERM GOAL #1   Title  Instruct patient in improved posture and alignment with pt to demonstrate activation of posterior shoulder girdle musculature 12/10/2018    Period  Weeks    Status  On-going      PT SHORT TERM GOAL #2   Title  Independent in initial HEP 12/10/2018    Baseline  encouraged compliance    Time  6    Period  Weeks    Status  On-going      PT SHORT TERM GOAL #3   Title  Instruct patient in appropriate exercises for home pool 12/10/2018    Time  6    Period  Weeks    Status  Achieved        PT Long Term Goals - 10/29/18 1607      PT LONG TERM GOAL #1   Title  Improve posture and alignment with patient to demonstrate improved upright posture with posterior shoulder girdle engaged 01/21/2019    Time  12    Period  Weeks    Status  New      PT LONG TERM GOAL #2   Title  Increase AROM Lt shoulder to ~ 120 degrees shoulder elevation and 50-60 degrees ER and hand to sacrum IR 01/21/2019    Time  12    Period  Weeks    Status  New      PT LONG TERM GOAL #3   Title  Patient reports using Lt UE for functional activities including washing face; turning on/off light switch; etc 01/21/2019    Time  12  Period  Weeks    Status  New      PT LONG TERM GOAL #4   Title  Independent in HEP 01/21/2019    Time  12    Period  Weeks    Status  New      PT LONG TERM GOAL  #5   Title  Improve FOTO =/< 42% limited by 01/21/2019    Time  12    Period  Weeks    Status  New            Plan - 12/05/18 1359    Clinical Impression Statement  Patient reports that she si still not sleeping well making it difficult to do her exercises. She has been doing some exercises in the pool. Patient has limited mobility/ROM; limited tolerance for manual work and P/AAROM Lt shoulder.    Rehab Potential  Good    PT Frequency  2x / week    PT Duration  12 weeks    PT Treatment/Interventions  Patient/family education;ADLs/Self Care Home Management;Cryotherapy;Moist Heat;Iontophoresis 4mg /ml Dexamethasone;Therapeutic activities;Therapeutic exercise;Neuromuscular re-education;Manual techniques;Dry needling    PT Next Visit Plan  progress with AAROM; PROM; AROM as tolerated    PT Home Exercise Plan  Access Code: [email or web address]    ( patient has a pool at her home )    Consulted and Agree with Plan of Care  Patient       Patient will benefit from skilled therapeutic intervention in order to improve the following deficits and impairments:  Postural dysfunction, Improper body mechanics, Pain, Increased fascial restricitons, Increased muscle spasms, Hypomobility, Decreased strength, Decreased mobility, Decreased range of motion, Decreased activity tolerance, Impaired UE functional use  Visit Diagnosis: 1. Acute pain of left shoulder   2. Other symptoms and signs involving the musculoskeletal system   3. Abnormal posture   4. Muscle weakness (generalized)        Problem List Patient Active Problem List   Diagnosis Date Noted  . Closed fracture of left proximal humerus 09/26/2018  . Squamous cell carcinoma 03/05/2017  . Echocardiogram shows left ventricular diastolic dysfunction 67/67/2094  . Heart palpitations 07/25/2016  . Need for vaccination for pneumococcus 07/13/2016  . Osteoarthritis of hand, right 08/06/2015  . Diabetes mellitus type 2, uncomplicated ([place]) 70/96/2836  .  Lung cancer, lower lobe ([place]) 10/09/2014  . Hematochezia 04/01/2013  . H/O colon cancer, stage II 09/19/2011  . Depression with anxiety 09/19/2011  . Hyperlipidemia 09/19/2011    America Sandall Nilda Simmer PT, MPH  12/05/2018, 2:57 PM  [facility] [place], [place], [address] Phone: [phone]   Fax:  [phone]  Name: Kelli [name] MRN: [id number] Date of Birth: [birth year]

## 2018-12-05 NOTE — Patient Instructions (Signed)
Access Code: [email or web address]  URL: [email or web address]  Date: 12/05/2018  Prepared by: Gillermo Murdoch   Exercises  Seated Cervical Retraction - 10 reps - 1 sets - 3x daily - 7x weekly  Standing Scapular Retraction - 10 reps - 1 sets - 10 hold - 3x daily - 7x weekly  Shoulder External Rotation and Scapular Retraction - 10 reps - 1 sets - hold - 3x daily - 7x weekly  Seated Shoulder External Rotation AAROM with Cane and Hand in Neutral - 10 reps - 1 sets - 10 sec hold - 3x daily - 7x weekly  Seated Shoulder Flexion Slide at Table Top with Forearm in Neutral - 5-10 reps - 1 sets - 10 sec hold - 3x daily - 7x weekly  Circular Shoulder Pendulum with Table Support - 3 reps - 1 sets - 30 sec hold - 2x daily - 7x weekly   Today  Seated Shoulder Flexion Towel Slide at Table Top - 10 reps - 1 sets - 10 sec hold - 2x daily - 7x weekly  Seated Shoulder Abduction Towel Slide at Table Top - 10 reps - 1 sets - 10 sec hold - 2x daily - 7x weekly  Seated Thoracic Lumbar Extension - 10 reps - 1 sets - 10 sec hold - 2x daily - 7x weekly

## 2018-12-05 NOTE — Addendum Note (Signed)
Addended by: Gregor Hams on: 12/05/2018 01:45 PM   Modules accepted: Orders

## 2018-12-09 ENCOUNTER — Other Ambulatory Visit: Payer: Self-pay | Admitting: Family Medicine

## 2018-12-09 DIAGNOSIS — Z13820 Encounter for screening for osteoporosis: Secondary | ICD-10-CM | POA: Diagnosis not present

## 2018-12-10 ENCOUNTER — Other Ambulatory Visit: Payer: Self-pay

## 2018-12-10 ENCOUNTER — Encounter: Payer: Self-pay | Admitting: Physical Therapy

## 2018-12-10 ENCOUNTER — Ambulatory Visit: Payer: Medicare HMO | Admitting: Physical Therapy

## 2018-12-10 DIAGNOSIS — M6281 Muscle weakness (generalized): Secondary | ICD-10-CM

## 2018-12-10 DIAGNOSIS — R293 Abnormal posture: Secondary | ICD-10-CM

## 2018-12-10 DIAGNOSIS — R29898 Other symptoms and signs involving the musculoskeletal system: Secondary | ICD-10-CM

## 2018-12-10 DIAGNOSIS — M25512 Pain in left shoulder: Secondary | ICD-10-CM | POA: Diagnosis not present

## 2018-12-10 NOTE — Therapy (Signed)
[place], [place], [address] Phone: [phone]   Fax:  [phone]  Physical Therapy Treatment  Patient Details  Name: Kelli [name] MRN: [id number] Date of Birth: 1945/08/08 Referring Provider (PT): Dr [name]   Progress Note Reporting Period 10/29/2018 to 12/10/2018  See note below for Objective Data and Assessment of Progress/Goals.      Encounter Date: 12/10/2018  PT End of Session - 12/10/18 1106    Visit Number  10    Number of Visits  24    Date for PT Re-Evaluation  01/21/19    PT Start Time  1107    PT Stop Time  1147    PT Time Calculation (min)  40 min    Activity Tolerance  Patient tolerated treatment well    Behavior During Therapy  [facility] for tasks assessed/performed       Past Medical History:  Diagnosis Date  . Anxiety   . Arthritis   . Borderline diabetic   . Colon cancer ([place])    colon ca dx 02/2009, lung cancer  . Depression   . Depression with anxiety 09/19/2011  . H/O colon cancer, stage II 09/19/2011   T3N0  [place] Sept 2010  . High cholesterol   . Hyperlipidemia 09/19/2011  . Lung nodules 09/19/2011   granulomas  . Urinary urgency     Past Surgical History:  Procedure Laterality Date  . APPENDECTOMY    . BREAST SURGERY  1978   augmentation  . COLON SURGERY     2010  . CRYO INTERCOSTAL NERVE BLOCK N/A 10/05/2014   Procedure: CRYO INTERCOSTAL NERVE BLOCK;  Surgeon: [name], MD;  Location: [place];  Service: Thoracic;  Laterality: N/A;  . LYMPH NODE DISSECTION Right 10/05/2014   Procedure: LYMPH NODE DISSECTION;  Surgeon: [name], MD;  Location: [place];  Service: Thoracic;  Laterality: Right;  . ORIF HUMERUS FRACTURE Left 10/01/2018   Procedure: OPEN REDUCTION INTERNAL FIXATION (ORIF) LEFT PROXIMAL HUMERUS FRACTURE;  Surgeon: [name], MD;  Location: [place];  Service: Orthopedics;  Laterality: Left;  . PARTIAL COLECTOMY Right  2010  . SEGMENTECOMY Right 10/05/2014   Procedure: right lower lobe SEGMENTECTOMY;  Surgeon: [name], MD;  Location: [place];  Service: Thoracic;  Laterality: Right;  [name] VIDEO ASSISTED THORACOSCOPY (VATS)/WEDGE RESECTION Right 10/05/2014   Procedure: Right VIDEO ASSISTED THORACOSCOPY (VATS) with right lower lobe lung nodule WEDGE RESECTION;  Surgeon: [name], MD;  Location: [place];  Service: Thoracic;  Laterality: Right;    There were no vitals filed for this visit.  Subjective Assessment - 12/10/18 1110    Subjective  Pt reports she is getting some sleep now that she is taking different medications.  She has tried not to favor her Lt shoulder and continues to do some exercises in the pool when she can.    Pertinent History  arthritis; AODM; MI; lung and colon cancer - now cancer free    Patient Stated Goals  use arm Lt UE and get it moving again     Currently in Pain?  No/denies         [facility] PT Assessment - 12/10/18 0001      Assessment   Medical Diagnosis  Lt proximal humeral fracture     Referring Provider (PT)  Dr [name]     Onset Date/Surgical Date  10/01/18    Hand Dominance  Right  Next MD Visit  12/23/18    Prior Therapy  here for Rt shoulder       Observation/Other Assessments   Focus on Therapeutic Outcomes (FOTO)   46% limitation,  42% goal.       AROM   Right/Left Shoulder  Left    Left Shoulder Extension  27 Degrees    Left Shoulder Flexion  78 Degrees    Left Shoulder ABduction  72 Degrees    Left Shoulder Internal Rotation  --   back of hand to side of buttocks   Left Shoulder External Rotation  12 Degrees      PROM   Right/Left Shoulder  Left    Left Shoulder Flexion  106 Degrees    Left Shoulder ABduction  90 Degrees   scaption   Left Shoulder External Rotation  18 Degrees   arm in supported scaption, with cane       [facility] Adult PT Treatment/Exercise - 12/10/18 0001      Shoulder Exercises: Supine   External  Rotation  AAROM;5 reps   cane   Flexion  AAROM;Both;5 reps   cane   Other Supine Exercises  scap squeeze/thoracic lift x 5 sec x 5 reps      Shoulder Exercises: Seated   External Rotation  AROM;Left;10 reps   arm resting on table, lifting forearm up into ER   Internal Rotation  AROM;AAROM;Left;5 reps      Shoulder Exercises: Standing   Internal Rotation  AAROM;Left   assisting w/ cane then clasping hands bringing hands up back   Extension  AAROM;Left;Right;10 reps   cane behind back - bringing hands closer together   Other Standing Exercises  wall ladder in Lt shoulder flexion x 5 reps       Shoulder Exercises: Pulleys   Flexion  --   10 sec hold x 10 reps    Flexion Limitations  cues to hold stretch, cues for not elevating shoulder    Scaption  --   10 sec hold x 10 reps      Shoulder Exercises: Stretch   External Rotation Stretch  --   15 reps of 5 sec, arm on table   Table Stretch - Flexion  5 reps;10 seconds   facing table 10 sec    Table Stretch - Abduction  5 reps;10 seconds   2 sets table at Lt side    Other Shoulder Stretches  lat stretch holding railing and walking back x 10 sec hold x 5 reps.       Modalities   Modalities  --   declined; will use at home.               PT Short Term Goals - 12/10/18 1158      PT SHORT TERM GOAL #1   Title  Instruct patient in improved posture and alignment with pt to demonstrate activation of posterior shoulder girdle musculature 12/10/2018    Time  6    Period  Weeks    Status  Partially Met      PT SHORT TERM GOAL #2   Title  Independent in initial HEP 12/10/2018    Baseline  encouraged compliance    Time  6    Period  Weeks    Status  Partially Met      PT SHORT TERM GOAL #3   Title  Instruct patient in appropriate exercises for home pool 12/10/2018    Time  6  Period  Weeks    Status  Achieved        PT Long Term Goals - 12/10/18 1158      PT LONG TERM GOAL #1   Title  Improve posture and alignment  with patient to demonstrate improved upright posture with posterior shoulder girdle engaged 01/21/2019    Time  12    Period  Weeks    Status  On-going      PT LONG TERM GOAL #2   Title  Increase AROM Lt shoulder to ~ 120 degrees shoulder elevation and 50-60 degrees ER and hand to sacrum IR 01/21/2019    Time  12    Period  Weeks    Status  On-going      PT LONG TERM GOAL #3   Title  Patient reports using Lt UE for functional activities including washing face; turning on/off light switch; etc 01/21/2019    Baseline  improved functional use reported    Time  12    Period  Weeks    Status  Partially Met      PT LONG TERM GOAL #4   Title  Independent in HEP 01/21/2019    Time  12    Period  Weeks    Status  On-going      PT LONG TERM GOAL #5   Title  Improve FOTO =/< 42% limited by 01/21/2019    Time  12    Period  Weeks    Status  On-going            Plan - 12/10/18 1249    Clinical Impression Statement  Pt making gradual progress with ROM.  Continued encouragement for compliance with HEP to assist in further mobility with LUE.  Pt's FOTO score improved to 46%.  Pt will benefit from continued PT intervention to maximize functional mobility.    Rehab Potential  Good    PT Frequency  2x / week    PT Duration  12 weeks    PT Treatment/Interventions  Patient/family education;ADLs/Self Care Home Management;Cryotherapy;Moist Heat;Iontophoresis 73m/ml Dexamethasone;Therapeutic activities;Therapeutic exercise;Neuromuscular re-education;Manual techniques;Dry needling    PT Next Visit Plan  progress with AAROM; PROM; AROM as tolerated    PT Home Exercise Plan  Access Code: [email or web address]   ( patient has a pool at her home )    Consulted and Agree with Plan of Care  Patient       Patient will benefit from skilled therapeutic intervention in order to improve the following deficits and impairments:  Postural dysfunction, Improper body mechanics, Pain, Increased fascial restricitons, Increased  muscle spasms, Hypomobility, Decreased strength, Decreased mobility, Decreased range of motion, Decreased activity tolerance, Impaired UE functional use  Visit Diagnosis: 1. Acute pain of left shoulder   2. Other symptoms and signs involving the musculoskeletal system   3. Abnormal posture   4. Muscle weakness (generalized)        Problem List Patient Active Problem List   Diagnosis Date Noted  . Closed fracture of left proximal humerus 09/26/2018  . Squamous cell carcinoma 03/05/2017  . Echocardiogram shows left ventricular diastolic dysfunction 024/46/9507 . Heart palpitations 07/25/2016  . Need for vaccination for pneumococcus 07/13/2016  . Osteoarthritis of hand, right 08/06/2015  . Diabetes mellitus type 2, uncomplicated ([place] 022/57/5051 . Lung cancer, [place] lobe ([place] 10/09/2014  . Hematochezia 04/01/2013  . H/O colon cancer, stage II 09/19/2011  . Depression with anxiety 09/19/2011  . Hyperlipidemia 09/19/2011  Kerin Perna, PTA 12/10/18 12:54 PM  Celyn P. Helene Kelp PT, MPH 12/10/18 1:28 PM   [facility] [place], [place], [address] Phone: [phone]   Fax:  [phone]  Name: ZIMAL WEISENSEL MRN: [id number] Date of Birth: 01-13-1946

## 2018-12-11 ENCOUNTER — Encounter: Payer: Self-pay | Admitting: Osteopathic Medicine

## 2018-12-11 ENCOUNTER — Ambulatory Visit (INDEPENDENT_AMBULATORY_CARE_PROVIDER_SITE_OTHER): Payer: Medicare HMO | Admitting: Osteopathic Medicine

## 2018-12-11 VITALS — BP 135/67 | HR 74 | Temp 97.4°F | Wt 176.9 lb

## 2018-12-11 DIAGNOSIS — Z0000 Encounter for general adult medical examination without abnormal findings: Secondary | ICD-10-CM | POA: Diagnosis not present

## 2018-12-11 DIAGNOSIS — E119 Type 2 diabetes mellitus without complications: Secondary | ICD-10-CM

## 2018-12-11 DIAGNOSIS — G4709 Other insomnia: Secondary | ICD-10-CM | POA: Diagnosis not present

## 2018-12-11 LAB — POCT GLYCOSYLATED HEMOGLOBIN (HGB A1C): Hemoglobin A1C: 9.9 % — AB (ref 4.0–5.6)

## 2018-12-11 MED ORDER — GABAPENTIN 300 MG PO CAPS: 300.0000 mg | ORAL_CAPSULE | Freq: Every evening | ORAL | 3 refills | Status: DC | PRN

## 2018-12-11 MED ORDER — CLONIDINE HCL 0.1 MG PO TABS: 0.1000 mg | ORAL_TABLET | Freq: Every day | ORAL | 3 refills | Status: DC

## 2018-12-11 MED ORDER — BYDUREON BCISE 2 MG/0.85ML ~~LOC~~ AUIJ: 2.0000 mg | AUTO-INJECTOR | SUBCUTANEOUS | 5 refills | Status: DC

## 2018-12-11 NOTE — Progress Notes (Signed)
HPI: Kelli [name] is a 73 y.o. female who  has a past medical history of Anxiety, Arthritis, Borderline diabetic, Colon cancer ([place]), Depression, Depression with anxiety (09/19/2011), H/O colon cancer, stage II (09/19/2011), High cholesterol, Hyperlipidemia (09/19/2011), Lung nodules (09/19/2011), and Urinary urgency.  she [facility] to [facility], 12/11/18,  for chief complaint of: Annual physical  Diabetes check-up   A1C and Rx management notes   03/11/18: 8.9% was eating high protein and limiting carbs but noted some weight gain so she's back to just general lower-carb diet.We tried adding jardiance, this was expensive but she did get it  06/12/18: 7.0% progress! Would like to try something cheaper than the jardiance. We tried Glipizide 10 mg daily, this was cut for a time d/t concern for hypoglycemia symptoms (tremors, fatigue episodes) but pt noted no serious difference w/ the change. She wanted to stop this Rx altogether and try strict diet/exercise + metformin.   09/11/18: 7.2% has been more careful about sugars, exercising seems to have made the most different. Doing ok off the Glipizide.   Today 12/11/18: 9.9% oh no! No changes in diet or activity.    DIABETES SCREENING/PREVENTIVE CARE: BP goal <130/80: borderline diastolic  BP Readings from Last 3 Encounters:  12/11/18 135/67  11/26/18 109/64  10/01/18 (!) 120/59    LDL goal <70: No  Eye exam annually: none on file, importance discussed with patient, pt reports done in 04/2018 Foot exam: needs  Microalbuminuria: negative Metformin: Yes  ACE/ARB: No  Antiplatelet if ASCVD Risk >10%: No  Statin: Yes  Pneumovax: Yes           Past medical, surgical, social and family history reviewed:  Patient Active Problem List   Diagnosis Date Noted  . Closed fracture of left proximal humerus 09/26/2018  . Squamous cell carcinoma 03/05/2017  . Echocardiogram shows left  ventricular diastolic dysfunction 38/75/6433  . Heart palpitations 07/25/2016  . Need for vaccination for pneumococcus 07/13/2016  . Osteoarthritis of hand, right 08/06/2015  . Diabetes mellitus type 2, uncomplicated ([place]) 29/51/8841  . Lung cancer, lower lobe ([place]) 10/09/2014  . Hematochezia 04/01/2013  . H/O colon cancer, stage II 09/19/2011  . Depression with anxiety 09/19/2011  . Hyperlipidemia 09/19/2011    Past Surgical History:  Procedure Laterality Date  . APPENDECTOMY    . BREAST SURGERY  1978   augmentation  . COLON SURGERY     2010  . CRYO INTERCOSTAL NERVE BLOCK N/A 10/05/2014   Procedure: CRYO INTERCOSTAL NERVE BLOCK;  Surgeon: [name], MD;  Location: [place];  Service: Thoracic;  Laterality: N/A;  . LYMPH NODE DISSECTION Right 10/05/2014   Procedure: LYMPH NODE DISSECTION;  Surgeon: [name], MD;  Location: [place];  Service: Thoracic;  Laterality: Right;  . ORIF HUMERUS FRACTURE Left 10/01/2018   Procedure: OPEN REDUCTION INTERNAL FIXATION (ORIF) LEFT PROXIMAL HUMERUS FRACTURE;  Surgeon: [name], MD;  Location: [place];  Service: Orthopedics;  Laterality: Left;  . PARTIAL COLECTOMY Right 2010  . SEGMENTECOMY Right 10/05/2014   Procedure: right lower lobe SEGMENTECTOMY;  Surgeon: [name], MD;  Location: [place];  Service: Thoracic;  Laterality: Right;  [name] VIDEO ASSISTED THORACOSCOPY (VATS)/WEDGE RESECTION Right 10/05/2014   Procedure: Right VIDEO ASSISTED THORACOSCOPY (VATS) with right lower lobe lung nodule WEDGE RESECTION;  Surgeon: [name], MD;  Location: [place];  Service: Thoracic;  Laterality: Right;    Social History   Tobacco Use  .  Smoking status: Former Smoker    Packs/day: 3.00    Years: 40.00    Pack years: 120.00    Types: Cigarettes    Quit date: 02/06/2009    Years since quitting: 9.8  . Smokeless tobacco: Never Used  Substance Use Topics  . Alcohol use: Yes    Alcohol/week: 0.0 standard drinks     Comment: 3-4 times a week 1-2 beers    Family History  Problem Relation Age of Onset  . Heart disease Mother   . Heart disease Brother      Current medication list and allergy/intolerance information reviewed:    Current Outpatient Medications  Medication Sig Dispense Refill  . AMBULATORY NON FORMULARY MEDICATION Single glucometer with lancets, test strips. Test daily.  Disp qs 3 months E11.9 1 each 0  . aspirin 81 MG tablet Take 81 mg by mouth daily with lunch.     . B Complex-C (B-COMPLEX WITH VITAMIN C) tablet Take 1 tablet by mouth daily with lunch.    . Cholecalciferol (VITAMIN D) 2000 UNITS tablet Take 2,000 Units by mouth daily with lunch.     [name] FLUoxetine (PROZAC) 40 MG capsule Take 1 capsule (40 mg total) by mouth daily. (Patient taking differently: Take 40 mg by mouth daily with lunch. ) 90 capsule 3  . gabapentin (NEURONTIN) 300 MG capsule Take 1 capsule (300 mg total) by mouth at bedtime as needed. 30 capsule 2  . HYDROcodone-acetaminophen (NORCO/VICODIN) 5-325 MG tablet 1 po q 8-12 hrs prn pain 30 tablet 0  . metFORMIN (GLUCOPHAGE-XR) 500 MG 24 hr tablet Take 2 tablets (1,000 mg total) by mouth daily with breakfast. (Patient taking differently: Take 1,000 mg by mouth daily with lunch. ) 180 tablet 3  . methocarbamol (ROBAXIN) 500 MG tablet Take 1 tablet (500 mg total) by mouth at bedtime as needed for muscle spasms. 20 tablet 0  . ondansetron (ZOFRAN) 4 MG tablet Take 1 tablet (4 mg total) by mouth every 6 (six) hours as needed for nausea or vomiting. 20 tablet 0  . pravastatin (PRAVACHOL) 80 MG tablet Take 1 tablet (80 mg total) by mouth daily. (Patient taking differently: Take 80 mg by mouth daily with lunch. ) 90 tablet 3  . QUEtiapine (SEROQUEL) 25 MG tablet Take 1-2 tablets (25-50 mg total) by mouth at bedtime as needed (insomnia). 30 tablet 1   No current facility-administered medications for this visit.     Allergies  Allergen Reactions  . Atorvastatin Other (See  Comments)    Muscle cramps  . Glipizide Er Other (See Comments)    Shaking / tingling in fingers       Review of Systems:  Constitutional:  No  fever, no chills, No recent illness, No unintentional weight changes. No significant fatigue.   HEENT: No  headache, no vision change, no hearing change, No sore throat, No  sinus pressure   Cardiac: No  chest pain, No  pressure, No palpitations, No  Orthopnea  Respiratory:  No  shortness of breath. No  Cough  Gastrointestinal: No  abdominal pain, No  nausea, No  vomiting,  No  blood in stool, No  diarrhea, No  constipation   Musculoskeletal: No new myalgia/arthralgia  Skin: No  Rash, No other wounds/concerning lesions  Genitourinary: No  Incontinence  Hem/Onc: No  easy bruising/bleeding  Neurologic: No  weakness, No  dizziness  Psychiatric: No  concerns with depression, No  concerns with anxiety, No sleep problems, No mood problems  Exam:  BP 135/67 (BP Location: Left Arm, Patient Position: Sitting, Cuff Size: Normal)   Pulse 74   Temp (!) 97.4 F (36.3 C) (Oral)   Wt 176 lb 14.4 oz (80.2 kg)   LMP  (LMP Unknown)   BMI 26.90 kg/m   Constitutional: VS see above. General Appearance: alert, well-developed, well-nourished, NAD  Eyes: Normal lids and conjunctive, non-icteric sclera  Neck: No masses, trachea midline. No thyroid enlargement. No tenderness/mass appreciated. No lymphadenopathy  Respiratory: Normal respiratory effort. no wheeze, no rhonchi, no rales  Cardiovascular: S1/S2 normal, no murmur, no rub/gallop auscultated. RRR. No lower extremity edema.   Gastrointestinal: Nontender, no masses.   Musculoskeletal: Gait normal. No clubbing/cyanosis of digits.   Neurological: Normal balance/coordination. No tremor.   Skin: warm, dry, intact. No rash/ulcer.   Psychiatric: Normal judgment/insight. Normal mood and affect. Oriented x3.    Results for orders placed or performed in visit on 12/11/18 (from the past 72  hour(s))  POCT HgB A1C     Status: Abnormal   Collection Time: 12/11/18  2:51 PM  Result Value Ref Range   Hemoglobin A1C 9.9 (A) 4.0 - 5.6 %   HbA1c POC (<> result, manual entry)     HbA1c, POC (prediabetic range)     HbA1c, POC (controlled diabetic range)        ASSESSMENT/PLAN: The primary encounter diagnosis was Annual physical exam. Diagnoses of Type 2 diabetes mellitus without complication, without long-term current use of insulin (HCC) and Other insomnia were also pertinent to this visit.  Declined labs today Orders Placed This Encounter  Procedures  . CBC  . COMPLETE METABOLIC PANEL WITH GFR  . Lipid panel  . TSH  . Hemoglobin A1c  . POCT HgB A1C    No orders of the defined types were placed in this encounter.   Patient Instructions  General Preventive Care  Most recent routine screening lipids/other labs: ordered today!   Everyone should have blood pressure checked once per year.   Tobacco: don't!  Alcohol: responsible moderation is ok for most adults - if you have concerns about your alcohol intake, please talk to me!   Exercise: as tolerated to reduce risk of cardiovascular disease and diabetes. Strength training will also prevent osteoporosis.   Mental health: if need for mental health care (medicines, counseling, other), or concerns about moods, please let me know!   Sexual health: if need for STD testing, or if concerns with libido/pain problems, please let me know!   Advanced Directive: Living Will and/or Healthcare Power of Attorney recommended for all adults, regardless of age or health.  Vaccines  Flu vaccine: recommended for almost everyone, every fall.   Shingles vaccine: Shingrix recommended after age 62. Medicare will NOT let us administer this vaccine in the office - if you'd like this vaccine please ask your pharmacist!   Pneumonia vaccines: done!  Tetanus booster: Tdap recommended every 10 years.  Medicare will NOT let us administer this  vaccine in the office - if you'd like this vaccine please ask your pharmacist!  Cancer screenings   Colon cancer screening: recommended for everyone age 68-75  Breast cancer screening: mammogram recommended annually, optional after age 57  Cervical cancer screening: Can usually stop Pap smears at age 73 or w/ hysterectomy.   Lung cancer screening: CT chest every year for those age 53 to 71 years old with ?30 pack year smoking history, who either currently smoke or have quit within the past 15 years. Infection screenings . HIV,  Gonorrhea/Chlamydia: screening as needed . Hepatitis C: recommended once for anyone born 38-1963 (done in 2018 and negative!) . TB: certain at-risk populations, or depending on work requirements and/or travel history Other . Bone Density Test: recommended every other year for women at age 67         Visit summary with medication list and pertinent instructions was printed for patient to review. All questions at time of visit were answered - patient instructed to contact office with any additional concerns or updates. ER/RTC precautions were reviewed with the patient.    Please note: voice recognition software was used to produce this document, and typos may escape review. Please contact Dr. Sheppard Coil for any needed clarifications.     Follow-up plan: Return in about 3 months (around 03/13/2019) for diabetes follow-up, labs ahead of visit (orders are in). OK for nurse visit for pen injection ed .

## 2018-12-11 NOTE — Patient Instructions (Addendum)
General Preventive Care  Most recent routine screening lipids/other labs: ordered today!   Everyone should have blood pressure checked once per year.   Tobacco: don't!  Alcohol: responsible moderation is ok for most adults - if you have concerns about your alcohol intake, please talk to me!   Exercise: as tolerated to reduce risk of cardiovascular disease and diabetes. Strength training will also prevent osteoporosis.   Mental health: if need for mental health care (medicines, counseling, other), or concerns about moods, please let me know!   Sexual health: if need for STD testing, or if concerns with libido/pain problems, please let me know!   Advanced Directive: Living Will and/or Healthcare Power of Attorney recommended for all adults, regardless of age or health.  Vaccines  Flu vaccine: recommended for almost everyone, every fall.   Shingles vaccine: Shingrix recommended after age 83. Medicare will NOT let us administer this vaccine in the office - if you'd like this vaccine please ask your pharmacist!   Pneumonia vaccines: done!  Tetanus booster: Tdap recommended every 10 years.  Medicare will NOT let us administer this vaccine in the office - if you'd like this vaccine please ask your pharmacist!  Cancer screenings   Colon cancer screening: recommended for everyone age 109-75  Breast cancer screening: mammogram recommended annually, optional after age 66  Cervical cancer screening: Can usually stop Pap smears at age 51 or w/ hysterectomy.   Lung cancer screening: CT chest every year for those age 25 to 71 years old with ?30 pack year smoking history, who either currently smoke or have quit within the past 15 years. Infection screenings . HIV, Gonorrhea/Chlamydia: screening as needed . Hepatitis C: recommended once for anyone born 80-1963 (done in 2018 and negative!) . TB: certain at-risk populations, or depending on work requirements and/or travel history Other . Bone  Density Test: recommended every other year for women at age 43

## 2018-12-13 ENCOUNTER — Other Ambulatory Visit: Payer: Self-pay

## 2018-12-13 ENCOUNTER — Encounter: Payer: Self-pay | Admitting: Rehabilitative and Restorative Service Providers"

## 2018-12-13 ENCOUNTER — Ambulatory Visit: Payer: Medicare HMO | Admitting: Rehabilitative and Restorative Service Providers"

## 2018-12-13 DIAGNOSIS — M25512 Pain in left shoulder: Secondary | ICD-10-CM

## 2018-12-13 DIAGNOSIS — R293 Abnormal posture: Secondary | ICD-10-CM | POA: Diagnosis not present

## 2018-12-13 DIAGNOSIS — M6281 Muscle weakness (generalized): Secondary | ICD-10-CM

## 2018-12-13 DIAGNOSIS — R29898 Other symptoms and signs involving the musculoskeletal system: Secondary | ICD-10-CM | POA: Diagnosis not present

## 2018-12-13 NOTE — Therapy (Signed)
[place], [place], [address] Phone: [phone]   Fax:  [phone]  Physical Therapy Treatment  Patient Details  Name: Kelli [name] MRN: [id number] Date of Birth: 1946/02/24 Referring Provider (PT): Dr [name]    Encounter Date: 12/13/2018  PT End of Session - 12/13/18 1334    Visit Number  11    Number of Visits  24    Date for PT Re-Evaluation  01/21/19    PT Start Time  7858    PT Stop Time  1420    PT Time Calculation (min)  47 min    Activity Tolerance  Patient tolerated treatment well       Past Medical History:  Diagnosis Date  . Anxiety   . Arthritis   . Borderline diabetic   . Colon cancer ([place])    colon ca dx 02/2009, lung cancer  . Depression   . Depression with anxiety 09/19/2011  . H/O colon cancer, stage II 09/19/2011   T3N0  Cecum Sept 2010  . High cholesterol   . Hyperlipidemia 09/19/2011  . Lung nodules 09/19/2011   granulomas  . Urinary urgency     Past Surgical History:  Procedure Laterality Date  . APPENDECTOMY    . BREAST SURGERY  1978   augmentation  . COLON SURGERY     2010  . CRYO INTERCOSTAL NERVE BLOCK N/A 10/05/2014   Procedure: CRYO INTERCOSTAL NERVE BLOCK;  Surgeon: [name], MD;  Location: [facility];  Service: Thoracic;  Laterality: N/A;  . LYMPH NODE DISSECTION Right 10/05/2014   Procedure: LYMPH NODE DISSECTION;  Surgeon: [name], MD;  Location: [facility];  Service: Thoracic;  Laterality: Right;  . ORIF HUMERUS FRACTURE Left 10/01/2018   Procedure: OPEN REDUCTION INTERNAL FIXATION (ORIF) LEFT PROXIMAL HUMERUS FRACTURE;  Surgeon: [name], MD;  Location: [facility];  Service: Orthopedics;  Laterality: Left;  . PARTIAL COLECTOMY Right 2010  . SEGMENTECOMY Right 10/05/2014   Procedure: right lower lobe SEGMENTECTOMY;  Surgeon: [name], MD;  Location: [facility];  Service: Thoracic;  Laterality: Right;  [name] VIDEO  ASSISTED THORACOSCOPY (VATS)/WEDGE RESECTION Right 10/05/2014   Procedure: Right VIDEO ASSISTED THORACOSCOPY (VATS) with right lower lobe lung nodule WEDGE RESECTION;  Surgeon: [name], MD;  Location: [facility];  Service: Thoracic;  Laterality: Right;    There were no vitals filed for this visit.  Subjective Assessment - 12/13/18 1335    Subjective  Patient reports that her shoulder is more painful today. Tried to do some of her exercises like she should and was proud that she did some exercises at home. She also slept on her shoulder "funny"    Currently in Pain?  No/denies   some pain when she moves shoulder                      [facility] Adult PT Treatment/Exercise - 12/13/18 0001      Therapeutic Activites    Therapeutic Activities  --   myofacial ball release work Lt shd girdle standing      Shoulder Exercises: Supine   Flexion  AAROM;Both;5 reps   assisting with Rt UE    Other Supine Exercises  scap squeeze/thoracic lift x 5 sec x 10 reps    Other Supine Exercises  rhythmic stabilization shoulder at 90 deg flexion - working on       Shoulder Exercises: Pulleys  Flexion  --   10 sec hold x 10 reps    Scaption  --   10 sec hold x 10 reps      Shoulder Exercises: Therapy Ball   Flexion  10 reps;Both    Flexion Limitations  rolling large green ball out on floor to work on shd flexion 10 sec x 10     Other Therapy Ball Exercises  scapular depression pressing into the large green ball 5 sec x 10     Other Therapy Ball Exercises  ball exercises with ~8 inch ball holding ball in both hands and working with functional movements - inc. elbow flex/ext; press out; wood chopping; pushing ball overhead working on shoulder flexion all in sitting.   shoulder at 90 deg rolling ball up and down and side to side on wall       Manual Therapy   Manual therapy comments  pt supine     Soft tissue mobilization  working through the Lt shoulder girdle - anterior deltoid/pecs      Passive ROM  Lt shoulder flexion; ER in scaption - to tissue limits and tolerance               PT Short Term Goals - 12/10/18 1158      PT SHORT TERM GOAL #1   Title  Instruct patient in improved posture and alignment with pt to demonstrate activation of posterior shoulder girdle musculature 12/10/2018    Time  6    Period  Weeks    Status  Partially Met      PT SHORT TERM GOAL #2   Title  Independent in initial HEP 12/10/2018    Baseline  encouraged compliance    Time  6    Period  Weeks    Status  Partially Met      PT SHORT TERM GOAL #3   Title  Instruct patient in appropriate exercises for home pool 12/10/2018    Time  6    Period  Weeks    Status  Achieved        PT Long Term Goals - 12/10/18 1158      PT LONG TERM GOAL #1   Title  Improve posture and alignment with patient to demonstrate improved upright posture with posterior shoulder girdle engaged 01/21/2019    Time  12    Period  Weeks    Status  On-going      PT LONG TERM GOAL #2   Title  Increase AROM Lt shoulder to ~ 120 degrees shoulder elevation and 50-60 degrees ER and hand to sacrum IR 01/21/2019    Time  12    Period  Weeks    Status  On-going      PT LONG TERM GOAL #3   Title  Patient reports using Lt UE for functional activities including washing face; turning on/off light switch; etc 01/21/2019    Baseline  improved functional use reported    Time  12    Period  Weeks    Status  Partially Met      PT LONG TERM GOAL #4   Title  Independent in HEP 01/21/2019    Time  12    Period  Weeks    Status  On-going      PT LONG TERM GOAL #5   Title  Improve FOTO =/< 42% limited by 01/21/2019    Time  12    Period  Weeks  Status  On-going            Plan - 12/13/18 1334    Clinical Impression Statement  Increased soreness reported today. Continues to be limited in all planes shoulder motion. Added supine stabilization. Continued work on ROM and stabilization.    Rehab Potential  Good     PT Frequency  2x / week    PT Duration  12 weeks    PT Treatment/Interventions  Patient/family education;ADLs/Self Care Home Management;Cryotherapy;Moist Heat;Iontophoresis 70m/ml Dexamethasone;Therapeutic activities;Therapeutic exercise;Neuromuscular re-education;Manual techniques;Dry needling    PT Next Visit Plan  progress with AAROM; PROM; AROM as tolerated    PT Home Exercise Plan  Access Code: [email or web address]   ( patient has a pool at her home )    Consulted and Agree with Plan of Care  Patient       Patient will benefit from skilled therapeutic intervention in order to improve the following deficits and impairments:  Postural dysfunction, Improper body mechanics, Pain, Increased fascial restricitons, Increased muscle spasms, Hypomobility, Decreased strength, Decreased mobility, Decreased range of motion, Decreased activity tolerance, Impaired UE functional use  Visit Diagnosis: 1. Acute pain of left shoulder   2. Other symptoms and signs involving the musculoskeletal system   3. Abnormal posture   4. Muscle weakness (generalized)        Problem List Patient Active Problem List   Diagnosis Date Noted  . Closed fracture of left proximal humerus 09/26/2018  . Squamous cell carcinoma 03/05/2017  . Echocardiogram shows left ventricular diastolic dysfunction 008/65/7846 . Heart palpitations 07/25/2016  . Need for vaccination for pneumococcus 07/13/2016  . Osteoarthritis of hand, right 08/06/2015  . Diabetes mellitus type 2, uncomplicated ([facility] 096/29/5284 . Lung cancer, [place] lobe ([facility] 10/09/2014  . Hematochezia 04/01/2013  . H/O colon cancer, stage II 09/19/2011  . Depression with anxiety 09/19/2011  . Hyperlipidemia 09/19/2011    Branch Pacitti PNilda SimmerPT, MPH  12/13/2018, 2:22 PM  [facility] [address]Phone: [phone]  Fax:  [phone] Name: Kelli SOLLERSMRN: [id number] of  Birth: [birth year]

## 2018-12-16 ENCOUNTER — Telehealth: Payer: Self-pay

## 2018-12-16 NOTE — Telephone Encounter (Signed)
Pt reported to me she had this completed at home, insurance set this up for her (seemed weird but ok). I don't think iv'e received results.

## 2018-12-16 NOTE — Telephone Encounter (Signed)
Unable to contact RN Loma Sousa to clarified request for referral. Unable to leave a vm. Phone keeps ringing, then disconnects. Will attempt to contact her tomorrow.

## 2018-12-16 NOTE — Telephone Encounter (Signed)
RN Daleen Bo called requesting a referral order for Bone Density testing. As per RN, order is needed for pt to keep in compliance with insurance. Wants to know if pt has completed one for the year or if an order will be placed? Pls advise, thanks.

## 2018-12-17 ENCOUNTER — Encounter: Payer: Self-pay | Admitting: Osteopathic Medicine

## 2018-12-17 ENCOUNTER — Ambulatory Visit (INDEPENDENT_AMBULATORY_CARE_PROVIDER_SITE_OTHER): Payer: Medicare HMO | Admitting: Osteopathic Medicine

## 2018-12-17 ENCOUNTER — Ambulatory Visit: Payer: Medicare HMO | Admitting: Physical Therapy

## 2018-12-17 ENCOUNTER — Other Ambulatory Visit: Payer: Self-pay

## 2018-12-17 DIAGNOSIS — M6281 Muscle weakness (generalized): Secondary | ICD-10-CM | POA: Diagnosis not present

## 2018-12-17 DIAGNOSIS — E119 Type 2 diabetes mellitus without complications: Secondary | ICD-10-CM

## 2018-12-17 DIAGNOSIS — R29898 Other symptoms and signs involving the musculoskeletal system: Secondary | ICD-10-CM | POA: Diagnosis not present

## 2018-12-17 DIAGNOSIS — M25512 Pain in left shoulder: Secondary | ICD-10-CM | POA: Diagnosis not present

## 2018-12-17 DIAGNOSIS — R293 Abnormal posture: Secondary | ICD-10-CM

## 2018-12-17 NOTE — Telephone Encounter (Signed)
Attempted to call nurse, phone rang several times then busy tone

## 2018-12-17 NOTE — Progress Notes (Signed)
Established Patient Office Visit  Subjective:  Patient ID: Kelli [name], female    DOB: 13-Jul-1945  Age: 73 y.o. MRN: [id number]  CC:  Chief Complaint  Patient presents with  . Patient Education    HPI [name] presents to learn how to give herself Bydureon injections.   Past Medical History:  Diagnosis Date  . Anxiety   . Arthritis   . Borderline diabetic   . Colon cancer ([place])    colon ca dx 02/2009, lung cancer  . Depression   . Depression with anxiety 09/19/2011  . H/O colon cancer, stage II 09/19/2011   T3N0  Cecum Sept 2010  . High cholesterol   . Hyperlipidemia 09/19/2011  . Lung nodules 09/19/2011   granulomas  . Urinary urgency     Past Surgical History:  Procedure Laterality Date  . APPENDECTOMY    . BREAST SURGERY  1978   augmentation  . COLON SURGERY     2010  . CRYO INTERCOSTAL NERVE BLOCK N/A 10/05/2014   Procedure: CRYO INTERCOSTAL NERVE BLOCK;  Surgeon: [name], MD;  Location: [facility];  Service: Thoracic;  Laterality: N/A;  . LYMPH NODE DISSECTION Right 10/05/2014   Procedure: LYMPH NODE DISSECTION;  Surgeon: [name], MD;  Location: [facility];  Service: Thoracic;  Laterality: Right;  . ORIF HUMERUS FRACTURE Left 10/01/2018   Procedure: OPEN REDUCTION INTERNAL FIXATION (ORIF) LEFT PROXIMAL HUMERUS FRACTURE;  Surgeon: [name], MD;  Location: [facility];  Service: Orthopedics;  Laterality: Left;  . PARTIAL COLECTOMY Right 2010  . SEGMENTECOMY Right 10/05/2014   Procedure: right lower lobe SEGMENTECTOMY;  Surgeon: [name], MD;  Location: [facility];  Service: Thoracic;  Laterality: Right;  [name] VIDEO ASSISTED THORACOSCOPY (VATS)/WEDGE RESECTION Right 10/05/2014   Procedure: Right VIDEO ASSISTED THORACOSCOPY (VATS) with right lower lobe lung nodule WEDGE RESECTION;  Surgeon: [name], MD;  Location: [facility];  Service: Thoracic;  Laterality: Right;    Family History  Problem Relation Age of Onset  . Heart disease  Mother   . Heart disease Brother     Social History   Socioeconomic History  . Marital status: Divorced    Spouse name: Not on file  . Number of children: Not on file  . Years of education: Not on file  . Highest education level: Not on file  Occupational History  . Not on file  Social Needs  . Financial resource strain: Not on file  . Food insecurity    Worry: Not on file    Inability: Not on file  . Transportation needs    Medical: Not on file    Non-medical: Not on file  Tobacco Use  . Smoking status: Former Smoker    Packs/day: 3.00    Years: 40.00    Pack years: 120.00    Types: Cigarettes    Quit date: 02/06/2009    Years since quitting: 9.8  . Smokeless tobacco: Never Used  Substance and Sexual Activity  . Alcohol use: Yes    Alcohol/week: 0.0 standard drinks    Comment: 3-4 times a week 1-2 beers  . Drug use: No  . Sexual activity: Never    Birth control/protection: Post-menopausal  Lifestyle  . Physical activity    Days per week: Not on file    Minutes per session: Not on file  . Stress: Not on file  Relationships  . Social connections    Talks on phone: Not on  file    Gets together: Not on file    Attends religious service: Not on file    Active member of club or organization: Not on file    Attends meetings of clubs or organizations: Not on file    Relationship status: Not on file  . Intimate partner violence    Fear of current or ex partner: Not on file    Emotionally abused: Not on file    Physically abused: Not on file    Forced sexual activity: Not on file  Other Topics Concern  . Not on file  Social History Narrative  . Not on file    Outpatient Medications Prior to Visit  Medication Sig Dispense Refill  . AMBULATORY NON FORMULARY MEDICATION Single glucometer with lancets, test strips. Test daily.  Disp qs 3 months E11.9 1 each 0  . aspirin 81 MG tablet Take 81 mg by mouth daily.    . B Complex-C (B-COMPLEX WITH VITAMIN C) tablet Take  1 tablet by mouth daily.    . Cholecalciferol (VITAMIN D) 2000 UNITS tablet Take 2,000 Units by mouth daily.    . cloNIDine (CATAPRES) 0.1 MG tablet Take 1 tablet (0.1 mg total) by mouth at bedtime. 90 tablet 3  . Exenatide ER (BYDUREON BCISE) 2 MG/0.85ML AUIJ Inject 2 mg into the skin once a week. 4 pen 5  . FLUoxetine (PROZAC) 40 MG capsule Take 1 capsule (40 mg total) by mouth daily. (Patient taking differently: Take 40 mg by mouth daily with lunch. ) 90 capsule 3  . gabapentin (NEURONTIN) 300 MG capsule Take 1 capsule (300 mg total) by mouth at bedtime as needed (insomnia). 90 capsule 3  . HYDROcodone-acetaminophen (NORCO/VICODIN) 5-325 MG tablet 1 po q 8-12 hrs prn pain 30 tablet 0  . pravastatin (PRAVACHOL) 80 MG tablet Take 1 tablet (80 mg total) by mouth daily. (Patient taking differently: Take 80 mg by mouth daily with lunch. ) 90 tablet 3  . metFORMIN (GLUCOPHAGE-XR) 500 MG 24 hr tablet Take 2 tablets (1,000 mg total) by mouth daily with breakfast. (Patient taking differently: Take 1,000 mg by mouth daily with lunch. ) 180 tablet 3   No facility-administered medications prior to visit.     Allergies  Allergen Reactions  . Atorvastatin Other (See Comments)    Muscle cramps  . Glipizide Er Other (See Comments)    Shaking / tingling in fingers     ROS Review of Systems    Objective:    Physical Exam  LMP  (LMP Unknown)  Wt Readings from Last 3 Encounters:  12/11/18 176 lb 14.4 oz (80.2 kg)  11/26/18 174 lb (78.9 kg)  10/01/18 181 lb 3.2 oz (82.2 kg)     Health Maintenance Due  Topic Date Due  . FOOT EXAM  01/20/1958  . OPHTHALMOLOGY EXAM  01/20/1958  . MAMMOGRAM  06/05/2012  . DEXA SCAN  01/20/2013  . PNA vac Low Risk Adult (2 of 2 - PPSV23) 08/22/2017    There are no preventive care reminders to display for this patient.  Lab Results  Component Value Date   TSH 2.36 08/13/2018   Lab Results  Component Value Date   WBC 8.8 10/01/2018   HGB 10.8 (L)  10/01/2018   HCT 33.0 (L) 10/01/2018   MCV 95.9 10/01/2018   PLT 360 10/01/2018   Lab Results  Component Value Date   NA 138 10/01/2018   K 3.9 10/01/2018   CHLORIDE 106 03/20/2017   CO2  22 10/01/2018   GLUCOSE 197 (H) 10/01/2018   BUN 14 10/01/2018   CREATININE 0.56 10/01/2018   BILITOT 0.4 08/13/2018   ALKPHOS 90 10/31/2016   AST 17 08/13/2018   ALT 17 08/13/2018   PROT 6.3 08/13/2018   ALBUMIN 4.0 10/31/2016   CALCIUM 9.3 10/01/2018   ANIONGAP 12 10/01/2018   EGFR >60 03/20/2017   Lab Results  Component Value Date   CHOL 220 (H) 11/29/2017   Lab Results  Component Value Date   HDL 51 11/29/2017   Lab Results  Component Value Date   LDLCALC 136 (H) 11/29/2017   Lab Results  Component Value Date   TRIG 191 (H) 11/29/2017   Lab Results  Component Value Date   CHOLHDL 4.3 11/29/2017   Lab Results  Component Value Date   HGBA1C 9.9 (A) 12/11/2018      Assessment & Plan:  Injection Education - Patient understood and preformed injection well without complications. The cost of the medication is $250 a month.    Problem List Items Addressed This Visit    Diabetes mellitus type 2, uncomplicated ([place]) - Primary      No orders of the defined types were placed in this encounter.   Follow-up: No follow-ups on file.    Lavell Luster, [place]

## 2018-12-17 NOTE — Therapy (Signed)
[facility] [address] [place], , [address] Phone: [phone]   Fax:  [phone]  Physical Therapy Treatment  Patient Details  Name: Kelli [name] MRN: [id number] Date of Birth: 07/03/1945 Referring Provider (PT): Dr Gregory Scott Dean    Encounter Date: 12/17/2018  PT End of Session - 12/17/18 1154    Visit Number  12    Number of Visits  24    Date for PT Re-Evaluation  01/21/19    PT Start Time  1058    PT Stop Time  1145    PT Time Calculation (min)  47 min       Past Medical History:  Diagnosis Date  . Anxiety   . Arthritis   . Borderline diabetic   . Colon cancer ([facility])    colon ca dx 02/2009, lung cancer  . Depression   . Depression with anxiety 09/19/2011  . H/O colon cancer, stage II 09/19/2011   T3N0  Cecum Sept 2010  . High cholesterol   . Hyperlipidemia 09/19/2011  . Lung nodules 09/19/2011   granulomas  . Urinary urgency     Past Surgical History:  Procedure Laterality Date  . APPENDECTOMY    . BREAST SURGERY  1978   augmentation  . COLON SURGERY     2010  . CRYO INTERCOSTAL NERVE BLOCK N/A 10/05/2014   Procedure: CRYO INTERCOSTAL NERVE BLOCK;  Surgeon: Steven C Hendrickson, MD;  [facility];  Service: Thoracic;  Laterality: N/A;  . LYMPH NODE DISSECTION Right 10/05/2014   Procedure: LYMPH NODE DISSECTION;  Surgeon: Steven C Hendrickson, MD;  [facility];  Service: Thoracic;  Laterality: Right;  . ORIF HUMERUS FRACTURE Left 10/01/2018   Procedure: OPEN REDUCTION INTERNAL FIXATION (ORIF) LEFT PROXIMAL HUMERUS FRACTURE;  Surgeon: Dean, Gregory Scott, MD;  [facility];  Service: Orthopedics;  Laterality: Left;  . PARTIAL COLECTOMY Right 2010  . SEGMENTECOMY Right 10/05/2014   Procedure: right lower lobe SEGMENTECTOMY;  Surgeon: Steven C Hendrickson, MD;  [facility];  Service: Thoracic;  Laterality: Right;  . VIDEO ASSISTED THORACOSCOPY (VATS)/WEDGE RESECTION Right 10/05/2014    Procedure: Right VIDEO ASSISTED THORACOSCOPY (VATS) with right lower lobe lung nodule WEDGE RESECTION;  Surgeon: Steven C Hendrickson, MD;  [facility];  Service: Thoracic;  Laterality: Right;    There were no vitals filed for this visit.  Subjective Assessment - 12/17/18 1127    Subjective  Pt reports her Lt shoulder is very stiff. She believes it is from doing so much with her arm in the pool yesterday.    Pertinent History  arthritis; AODM; MI; lung and colon cancer - now cancer free    Patient Stated Goals  use arm Lt UE and get it moving again     Currently in Pain?  No/denies    Pain Score  0-No pain         [facility] PT Assessment - 12/17/18 0001      Assessment   Medical Diagnosis  Lt proximal humeral fracture     Referring Provider (PT)  Dr Gregory Scott Dean     Onset Date/Surgical Date  10/01/18    Hand Dominance  Right    Next MD Visit  12/23/18    Prior Therapy  here for Rt shoulder       PROM   Left Shoulder Flexion  118 Degrees    Left Shoulder External Rotation  20 Degrees   arm in supported   scaption, with cane      [facility] Adult PT Treatment/Exercise - 12/17/18 0001      Shoulder Exercises: Supine   External Rotation  AAROM;10 reps   cane   Internal Rotation  AAROM;Left;10 reps   strap assist behind back   Flexion  AAROM;Both;5 reps   cane with Rt UE    ABduction  AAROM;Left;10 reps   scaption with cane   Other Supine Exercises  scap squeeze/thoracic lift x 5 sec x 10 reps;  AAROM (assist from Rt hand) for Lt hand to forehead     Other Supine Exercises  rhythmic stabilization shoulder at 90 deg flexion, x 30 sec, 45 deg x 15 sec;  Lt shoulder flexion to 70 deg and ABCs with arm x 1 rep      Shoulder Exercises: Standing   Other Standing Exercises  wall ladder in Lt shoulder flexion x 5 reps (up to #23)      Shoulder Exercises: Pulleys   Flexion  --   10 sec hold x 10 reps    Scaption  --   10 sec hold x 10 reps    Other Pulley Exercises  trial of  gentle movement bringing arm into internal rotation using pulley to support wt of arm       Manual Therapy   Manual therapy comments  pt supine     Soft tissue mobilization  working through the Lt shoulder girdle - anterior deltoid/pecs     Passive ROM  Lt shoulder flexion; scaption, and ER in scaption - to tissue limits and tolerance               PT Short Term Goals - 12/10/18 1158      PT SHORT TERM GOAL #1   Title  Instruct patient in improved posture and alignment with pt to demonstrate activation of posterior shoulder girdle musculature 12/10/2018    Time  6    Period  Weeks    Status  Partially Met      PT SHORT TERM GOAL #2   Title  Independent in initial HEP 12/10/2018    Baseline  encouraged compliance    Time  6    Period  Weeks    Status  Partially Met      PT SHORT TERM GOAL #3   Title  Instruct patient in appropriate exercises for home pool 12/10/2018    Time  6    Period  Weeks    Status  Achieved        PT Long Term Goals - 12/10/18 1158      PT LONG TERM GOAL #1   Title  Improve posture and alignment with patient to demonstrate improved upright posture with posterior shoulder girdle engaged 01/21/2019    Time  12    Period  Weeks    Status  On-going      PT LONG TERM GOAL #2   Title  Increase AROM Lt shoulder to ~ 120 degrees shoulder elevation and 50-60 degrees ER and hand to sacrum IR 01/21/2019    Time  12    Period  Weeks    Status  On-going      PT LONG TERM GOAL #3   Title  Patient reports using Lt UE for functional activities including washing face; turning on/off light switch; etc 01/21/2019    Baseline  improved functional use reported    Time  12    Period  Weeks      Status  Partially Met      PT LONG TERM GOAL #4   Title  Independent in HEP 01/21/2019    Time  12    Period  Weeks    Status  On-going      PT LONG TERM GOAL #5   Title  Improve FOTO =/< 42% limited by 01/21/2019    Time  12    Period  Weeks    Status  On-going             Plan - 12/17/18 1152    Clinical Impression Statement  Gradual improvement to Lt shoulder ROM, however continues to be limited in all planes.  Pt tolerated all exercises and manual therapy well, with min increase in discomfort.  Pt returns to MD on Monday.    Rehab Potential  Good    PT Frequency  2x / week    PT Duration  12 weeks    PT Treatment/Interventions  Patient/family education;ADLs/Self Care Home Management;Cryotherapy;Moist Heat;Iontophoresis 4mg/ml Dexamethasone;Therapeutic activities;Therapeutic exercise;Neuromuscular re-education;Manual techniques;Dry needling    PT Next Visit Plan  progress with AAROM; PROM; AROM as tolerated - MD note    PT Home Exercise Plan  Access Code: [email or web address]    ( patient has a pool at her home )    Consulted and Agree with Plan of Care  Patient       Patient will benefit from skilled therapeutic intervention in order to improve the following deficits and impairments:  Postural dysfunction, Improper body mechanics, Pain, Increased fascial restricitons, Increased muscle spasms, Hypomobility, Decreased strength, Decreased mobility, Decreased range of motion, Decreased activity tolerance, Impaired UE functional use  Visit Diagnosis: 1. Acute pain of left shoulder   2. Other symptoms and signs involving the musculoskeletal system   3. Abnormal posture   4. Muscle weakness (generalized)        Problem List Patient Active Problem List   Diagnosis Date Noted  . Closed fracture of left proximal humerus 09/26/2018  . Squamous cell carcinoma 03/05/2017  . Echocardiogram shows left ventricular diastolic dysfunction 08/21/2016  . Heart palpitations 07/25/2016  . Need for vaccination for pneumococcus 07/13/2016  . Osteoarthritis of hand, right 08/06/2015  . Diabetes mellitus type 2, uncomplicated ([facility]) 12/31/2014  . Lung cancer, lower lobe ([facility]) 10/09/2014  . Hematochezia 04/01/2013  . H/O colon cancer, stage II 09/19/2011  . Depression  with anxiety 09/19/2011  . Hyperlipidemia 09/19/2011    Carlson-Long, PTA 12/17/18 12:08 PM  [facility] [address] [place], [place], [address] Phone: [phone]   Fax:  [phone]  Name: Peni A Seaberry MRN: [id number] Date of Birth: 03/10/1946   

## 2018-12-18 NOTE — Telephone Encounter (Signed)
Attempted a second call - no answer, phone just keeps ringing. Unable to leave a vm msg.

## 2018-12-19 MED ORDER — TRULICITY 0.75 MG/0.5ML ~~LOC~~ SOAJ: 0.7500 mg | SUBCUTANEOUS | 5 refills | Status: DC

## 2018-12-20 ENCOUNTER — Other Ambulatory Visit: Payer: Self-pay

## 2018-12-20 ENCOUNTER — Ambulatory Visit: Payer: Medicare HMO | Admitting: Physical Therapy

## 2018-12-20 DIAGNOSIS — M25512 Pain in left shoulder: Secondary | ICD-10-CM

## 2018-12-20 DIAGNOSIS — R29898 Other symptoms and signs involving the musculoskeletal system: Secondary | ICD-10-CM | POA: Diagnosis not present

## 2018-12-20 DIAGNOSIS — M6281 Muscle weakness (generalized): Secondary | ICD-10-CM | POA: Diagnosis not present

## 2018-12-20 DIAGNOSIS — R293 Abnormal posture: Secondary | ICD-10-CM

## 2018-12-20 NOTE — Therapy (Signed)
[facility] [place], [place], [address] Phone: [phone]   Fax:  [phone]  Physical Therapy Treatment  Patient Details  Name: Kelli [name] MRN: [id number] Date of Birth: 1946-03-09 Referring Provider (PT): Dr [name]    Encounter Date: 12/20/2018  PT End of Session - 12/20/18 1340    Visit Number  13    Number of Visits  24    Date for PT Re-Evaluation  01/21/19    PT Start Time  1335    PT Stop Time  1415    PT Time Calculation (min)  40 min    Activity Tolerance  Patient tolerated treatment well    Behavior During Therapy  [facility] for tasks assessed/performed       Past Medical History:  Diagnosis Date  . Anxiety   . Arthritis   . Borderline diabetic   . Colon cancer ([place])    colon ca dx 02/2009, lung cancer  . Depression   . Depression with anxiety 09/19/2011  . H/O colon cancer, stage II 09/19/2011   T3N0  Cecum Sept 2010  . High cholesterol   . Hyperlipidemia 09/19/2011  . Lung nodules 09/19/2011   granulomas  . Urinary urgency     Past Surgical History:  Procedure Laterality Date  . APPENDECTOMY    . BREAST SURGERY  1978   augmentation  . COLON SURGERY     2010  . CRYO INTERCOSTAL NERVE BLOCK N/A 10/05/2014   Procedure: CRYO INTERCOSTAL NERVE BLOCK;  Surgeon: [name], MD;  Location: [place];  Service: Thoracic;  Laterality: N/A;  . LYMPH NODE DISSECTION Right 10/05/2014   Procedure: LYMPH NODE DISSECTION;  Surgeon: [name], MD;  Location: [place];  Service: Thoracic;  Laterality: Right;  . ORIF HUMERUS FRACTURE Left 10/01/2018   Procedure: OPEN REDUCTION INTERNAL FIXATION (ORIF) LEFT PROXIMAL HUMERUS FRACTURE;  Surgeon: [name], MD;  Location: [place];  Service: Orthopedics;  Laterality: Left;  . PARTIAL COLECTOMY Right 2010  . SEGMENTECOMY Right 10/05/2014   Procedure: right lower lobe SEGMENTECTOMY;  Surgeon: [name], MD;  Location:  [place];  Service: Thoracic;  Laterality: Right;  [name] VIDEO ASSISTED THORACOSCOPY (VATS)/WEDGE RESECTION Right 10/05/2014   Procedure: Right VIDEO ASSISTED THORACOSCOPY (VATS) with right lower lobe lung nodule WEDGE RESECTION;  Surgeon: [name], MD;  Location: [place];  Service: Thoracic;  Laterality: Right;    There were no vitals filed for this visit.  Subjective Assessment - 12/20/18 1341    Subjective  Jajaira reports continued stiffness in Lt shoulder; she attributes it to working in the pool and not using heat on shoulder prior to arrival.    Pertinent History  arthritis; AODM; MI; lung and colon cancer - now cancer free    Currently in Pain?  No/denies    Pain Score  0-No pain         [facility] PT Assessment - 12/20/18 0001      Assessment   Medical Diagnosis  Lt proximal humeral fracture     Referring Provider (PT)  Dr [name]     Onset Date/Surgical Date  10/01/18    Hand Dominance  Right    Next MD Visit  12/23/18    Prior Therapy  here for Rt shoulder       AROM   Left Shoulder Extension  45 Degrees    Left Shoulder Flexion  80 Degrees  Left Shoulder ABduction  78 Degrees    Left Shoulder Internal Rotation  --   palm to Lt buttocks     PROM   Left Shoulder Flexion  110 Degrees   AAROM with cane in supine   Left Shoulder External Rotation  20 Degrees   arm in supported scaption, AAROM with cane      [facility] Adult PT Treatment/Exercise - 12/20/18 0001      Shoulder Exercises: Supine   External Rotation  AAROM;10 reps   cane   Flexion  AAROM;Both;5 reps   cane with Rt UE    ABduction  AAROM;Left;10 reps   scaption with cane   Other Supine Exercises  scap squeeze/thoracic lift x 5 sec x 10 reps;  AAROM (assist from Rt hand) for Lt hand to forehead       Shoulder Exercises: Standing   Internal Rotation  AAROM;Left;5 reps   assisting w/ cane then clasping hands bringing hands up back   Flexion  Left;AROM;5 reps    ABduction  AROM;Left;5 reps     Extension Limitations  Lt shoulder ext stretch holding countertop and stepping forward x 15 sec x 3 reps     Other Standing Exercises  wall ladder in Lt shoulder flexion and scaption x 5 reps (up to #23)- cues to decrease compensation in scapula.    Other Standing Exercises  pendulum with Lt arm       Shoulder Exercises: Pulleys   Flexion  --   10 sec hold x 10 reps    Scaption  --   10 sec hold x 10 reps      Shoulder Exercises: Stretch   Other Shoulder Stretches  lat stretch holding railing and walking back x 10 sec hold x 5 reps.       Manual Therapy   Manual therapy comments  pt supine     Soft tissue mobilization  working through the Lt shoulder girdle - anterior deltoid/pecs     Passive ROM  Lt shoulder flexion; scaption, and ER in scaption - to tissue limits and tolerance               PT Short Term Goals - 12/10/18 1158      PT SHORT TERM GOAL #1   Title  Instruct patient in improved posture and alignment with pt to demonstrate activation of posterior shoulder girdle musculature 12/10/2018    Time  6    Period  Weeks    Status  Partially Met      PT SHORT TERM GOAL #2   Title  Independent in initial HEP 12/10/2018    Baseline  encouraged compliance    Time  6    Period  Weeks    Status  Partially Met      PT SHORT TERM GOAL #3   Title  Instruct patient in appropriate exercises for home pool 12/10/2018    Time  6    Period  Weeks    Status  Achieved        PT Long Term Goals - 12/20/18 1439      PT LONG TERM GOAL #1   Title  Improve posture and alignment with patient to demonstrate improved upright posture with posterior shoulder girdle engaged 01/21/2019    Time  12    Period  Weeks    Status  On-going      PT LONG TERM GOAL #2   Title  Increase AROM Lt shoulder to ~  120 degrees shoulder elevation and 50-60 degrees ER and hand to sacrum IR 01/21/2019    Baseline  pt able to get hand to sacrum; continued limitation with flexion/ ER    Time  12    Period   Weeks    Status  Partially Met      PT LONG TERM GOAL #3   Title  Patient reports using Lt UE for functional activities including washing face; turning on/off light switch; etc 01/21/2019    Baseline  improved functional use reported    Time  12    Period  Weeks    Status  Partially Met      PT LONG TERM GOAL #4   Title  Independent in HEP 01/21/2019    Time  12    Period  Weeks    Status  On-going      PT LONG TERM GOAL #5   Title  Improve FOTO =/< 42% limited by 01/21/2019    Time  12    Period  Weeks    Status  On-going            Plan - 12/20/18 1445    Clinical Impression Statement  Pt's Lt shoulder ROM has made gradual gains.  Pt reports increased functional use of LUE.  Pt able to sleep better since adjustments in medication, therefore tolerating therapy sessions better and reporting improved compliance of HEP outside of sessions.  Pt has partially met her goals and will benefit from continued PT intervention to maximize rehab potential.    Rehab Potential  Good    PT Frequency  2x / week    PT Duration  12 weeks    PT Treatment/Interventions  Patient/family education;ADLs/Self Care Home Management;Cryotherapy;Moist Heat;Iontophoresis 14m/ml Dexamethasone;Therapeutic activities;Therapeutic exercise;Neuromuscular re-education;Manual techniques;Dry needling    PT Next Visit Plan  progress with AAROM; PROM; AROM as tolerated. Pt to decrease freq to 1x/wk to make most of remaining visits.    PT Home Exercise Plan  Access Code: [email or web address]   ( patient has a pool at her home )    Recommended Other Services  NO MODALITIES other than cold/heat due to history of cancer.    Consulted and Agree with Plan of Care  Patient       Patient will benefit from skilled therapeutic intervention in order to improve the following deficits and impairments:  Postural dysfunction, Improper body mechanics, Pain, Increased fascial restricitons, Increased muscle spasms, Hypomobility, Decreased  strength, Decreased mobility, Decreased range of motion, Decreased activity tolerance, Impaired UE functional use  Visit Diagnosis: 1. Acute pain of left shoulder   2. Other symptoms and signs involving the musculoskeletal system   3. Abnormal posture   4. Muscle weakness (generalized)        Problem List Patient Active Problem List   Diagnosis Date Noted  . Closed fracture of left proximal humerus 09/26/2018  . Squamous cell carcinoma 03/05/2017  . Echocardiogram shows left ventricular diastolic dysfunction 014/48/1856 . Heart palpitations 07/25/2016  . Need for vaccination for pneumococcus 07/13/2016  . Osteoarthritis of hand, right 08/06/2015  . Diabetes mellitus type 2, uncomplicated ([place] 031/49/7026 . [place] 10/09/2014  . Hematochezia 04/01/2013  . H/O colon cancer, stage II 09/19/2011  . Depression with anxiety 09/19/2011  . Hyperlipidemia 09/19/2011   JKerin Perna PTA 12/20/18 2:49 PM  [address] [address]Phone: [phone]  Fax:  [phone]  Name: Kelli [name] MRN: [id number] Date of Birth: 04-10-1946

## 2018-12-23 ENCOUNTER — Ambulatory Visit (INDEPENDENT_AMBULATORY_CARE_PROVIDER_SITE_OTHER): Payer: Medicare HMO

## 2018-12-23 ENCOUNTER — Encounter: Payer: Self-pay | Admitting: Orthopedic Surgery

## 2018-12-23 ENCOUNTER — Ambulatory Visit (INDEPENDENT_AMBULATORY_CARE_PROVIDER_SITE_OTHER): Payer: Medicare HMO | Admitting: Orthopedic Surgery

## 2018-12-23 DIAGNOSIS — S42202D Unspecified fracture of upper end of left humerus, subsequent encounter for fracture with routine healing: Secondary | ICD-10-CM

## 2018-12-23 NOTE — Addendum Note (Signed)
Addended byLaurann Montana on: 12/23/2018 02:08 PM   Modules accepted: Orders

## 2018-12-23 NOTE — Progress Notes (Signed)
Post-Op Visit Note   Patient: Kelli [name]           Date of Birth: 1946/04/26           MRN: [id number] Visit Date: 12/23/2018 PCP: Emeterio Reeve, DO   Assessment & Plan:  Chief Complaint:  Chief Complaint  Patient presents with  . Left Shoulder - Follow-up, Fracture  . Constipation   Visit Diagnoses:  1. Closed fracture of proximal end of left humerus with routine healing, unspecified fracture morphology, subsequent encounter     Plan: Patient is now almost 3 months out left shoulder proximal humerus fracture production internal fixation.  She has been doing reasonably well and is making some gains in therapy.  On exam she still does not have forward flexion abduction above 90 but it is improving.  I like her to continue with therapy 1 time a week for 8 weeks as well as spending at least 1 to 2 hours a day at home working on stretching.  The stretching we both active assisted as well as passive.  Radiographs today look good in terms of sphericity maintenance of the humeral head with no AVN as well as bone healing.  Follow-Up Instructions: Return in about 8 weeks (around 02/17/2019).   Orders:  Orders Placed This Encounter  Procedures  . XR Shoulder Left   No orders of the defined types were placed in this encounter.   Imaging: Xr Shoulder Left  Result Date: 12/23/2018 AP axillary and outlet view left shoulder reviewed left shoulder reviewed.  Hardware in good position alignment.  No evidence of complication.  Bone healing is in progress.    PMFS History: Patient Active Problem List   Diagnosis Date Noted  . Closed fracture of left proximal humerus 09/26/2018  . Squamous cell carcinoma 03/05/2017  . Echocardiogram shows left ventricular diastolic dysfunction 31/51/7616  . Heart palpitations 07/25/2016  . Need for vaccination for pneumococcus 07/13/2016  . Osteoarthritis of hand, right 08/06/2015  . Diabetes mellitus type 2, uncomplicated ([place]) 07/37/1062  .  Lung cancer, lower lobe ([place]) 10/09/2014  . Hematochezia 04/01/2013  . H/O colon cancer, stage II 09/19/2011  . Depression with anxiety 09/19/2011  . Hyperlipidemia 09/19/2011   Past Medical History:  Diagnosis Date  . Anxiety   . Arthritis   . Borderline diabetic   . Colon cancer ([place])    colon ca dx 02/2009, lung cancer  . Depression   . Depression with anxiety 09/19/2011  . H/O colon cancer, stage II 09/19/2011   T3N0  Cecum Sept 2010  . High cholesterol   . Hyperlipidemia 09/19/2011  . Lung nodules 09/19/2011   granulomas  . Urinary urgency     Family History  Problem Relation Age of Onset  . Heart disease Mother   . Heart disease Brother     Past Surgical History:  Procedure Laterality Date  . APPENDECTOMY    . BREAST SURGERY  1978   augmentation  . COLON SURGERY     2010  . CRYO INTERCOSTAL NERVE BLOCK N/A 10/05/2014   Procedure: CRYO INTERCOSTAL NERVE BLOCK;  Surgeon: [name], MD;  Location: [place];  Service: Thoracic;  Laterality: N/A;  . LYMPH NODE DISSECTION Right 10/05/2014   Procedure: LYMPH NODE DISSECTION;  Surgeon: [name], MD;  Location: [place];  Service: Thoracic;  Laterality: Right;  . ORIF HUMERUS FRACTURE Left 10/01/2018   Procedure: OPEN REDUCTION INTERNAL FIXATION (ORIF) LEFT PROXIMAL HUMERUS FRACTURE;  Surgeon: [name],  Tonna Corner, MD;  Location: [place];  Service: Orthopedics;  Laterality: Left;  . PARTIAL COLECTOMY Right 2010  . SEGMENTECOMY Right 10/05/2014   Procedure: right lower lobe SEGMENTECTOMY;  Surgeon: [name], MD;  Location: [place];  Service: Thoracic;  Laterality: Right;  [name] VIDEO ASSISTED THORACOSCOPY (VATS)/WEDGE RESECTION Right 10/05/2014   Procedure: Right VIDEO ASSISTED THORACOSCOPY (VATS) with right lower lobe lung nodule WEDGE RESECTION;  Surgeon: [name], MD;  Location: [place];  Service: Thoracic;  Laterality: Right;   Social History   Occupational History  . Not on file  Tobacco Use  .  Smoking status: Former Smoker    Packs/day: 3.00    Years: 40.00    Pack years: 120.00    Types: Cigarettes    Quit date: 02/06/2009    Years since quitting: 9.8  . Smokeless tobacco: Never Used  Substance and Sexual Activity  . Alcohol use: Yes    Alcohol/week: 0.0 standard drinks    Comment: 3-4 times a week 1-2 beers  . Drug use: No  . Sexual activity: Never    Birth control/protection: Post-menopausal

## 2018-12-24 ENCOUNTER — Telehealth: Payer: Self-pay

## 2018-12-24 NOTE — Telephone Encounter (Signed)
Ms. Kelli [name] left a vm msg requesting an update on bone density testing / referral for pt. Attempted to return a call back, no answer. Left her a detailed vm msg of the following from provider - " Pt reported to me she had this completed at home, insurance set this up for her (seemed weird but ok). I don't think iv'e received results". Direct call back info provided if she has add'l inquiries.

## 2018-12-25 ENCOUNTER — Ambulatory Visit (INDEPENDENT_AMBULATORY_CARE_PROVIDER_SITE_OTHER): Payer: Medicare HMO | Admitting: Rehabilitative and Restorative Service Providers"

## 2018-12-25 ENCOUNTER — Encounter: Payer: Self-pay | Admitting: Rehabilitative and Restorative Service Providers"

## 2018-12-25 ENCOUNTER — Other Ambulatory Visit: Payer: Self-pay

## 2018-12-25 DIAGNOSIS — R293 Abnormal posture: Secondary | ICD-10-CM | POA: Diagnosis not present

## 2018-12-25 DIAGNOSIS — R29898 Other symptoms and signs involving the musculoskeletal system: Secondary | ICD-10-CM

## 2018-12-25 DIAGNOSIS — M25512 Pain in left shoulder: Secondary | ICD-10-CM | POA: Diagnosis not present

## 2018-12-25 DIAGNOSIS — M6281 Muscle weakness (generalized): Secondary | ICD-10-CM | POA: Diagnosis not present

## 2018-12-25 NOTE — Therapy (Signed)
[facility] [place], [place], [address] Phone: [phone]   Fax:  [phone]  Physical Therapy Treatment  Patient Details  Name: Kelli [name] MRN: [id number] Date of Birth: June 03, 1946 Referring Provider (PT): Dr [name]    Encounter Date: 12/25/2018  PT End of Session - 12/25/18 1011    Visit Number  14    Number of Visits  24    Date for PT Re-Evaluation  01/21/19    PT Start Time  1000    PT Stop Time  1047    PT Time Calculation (min)  47 min    Activity Tolerance  Patient tolerated treatment well       Past Medical History:  Diagnosis Date  . Anxiety   . Arthritis   . Borderline diabetic   . Colon cancer ([place])    colon ca dx 02/2009, lung cancer  . Depression   . Depression with anxiety 09/19/2011  . H/O colon cancer, stage II 09/19/2011   T3N0  Cecum Sept 2010  . High cholesterol   . Hyperlipidemia 09/19/2011  . Lung nodules 09/19/2011   granulomas  . Urinary urgency     Past Surgical History:  Procedure Laterality Date  . APPENDECTOMY    . BREAST SURGERY  1978   augmentation  . COLON SURGERY     2010  . CRYO INTERCOSTAL NERVE BLOCK N/A 10/05/2014   Procedure: CRYO INTERCOSTAL NERVE BLOCK;  Surgeon: [name], MD;  Location: [place];  Service: Thoracic;  Laterality: N/A;  . LYMPH NODE DISSECTION Right 10/05/2014   Procedure: LYMPH NODE DISSECTION;  Surgeon: [name], MD;  Location: [place];  Service: Thoracic;  Laterality: Right;  . ORIF HUMERUS FRACTURE Left 10/01/2018   Procedure: OPEN REDUCTION INTERNAL FIXATION (ORIF) LEFT PROXIMAL HUMERUS FRACTURE;  Surgeon: [name], MD;  Location: [facility] [place];  Service: Orthopedics;  Laterality: Left;  . PARTIAL COLECTOMY Right 2010  . SEGMENTECOMY Right 10/05/2014   Procedure: right lower lobe SEGMENTECTOMY;  Surgeon: [name], MD;  Location: [place];  Service: Thoracic;  Laterality: Right;  [name] VIDEO  ASSISTED THORACOSCOPY (VATS)/WEDGE RESECTION Right 10/05/2014   Procedure: Right VIDEO ASSISTED THORACOSCOPY (VATS) with right lower lobe lung nodule WEDGE RESECTION;  Surgeon: [name], MD;  Location: [place];  Service: Thoracic;  Laterality: Right;    There were no vitals filed for this visit.  Subjective Assessment - 12/25/18 1014    Subjective  Saw the doctor this week and he moved her shoulder and arm and it is really sore. MD wants her to continue with PT for another month.    Currently in Pain?  No/denies    Pain Location  Shoulder    Pain Orientation  Left    Pain Descriptors / Indicators  Sore    Pain Type  Acute pain                       [facility] Adult PT Treatment/Exercise - 12/25/18 0001      Shoulder Exercises: Standing   Extension  Strengthening;Both;10 reps;Theraband    Theraband Level (Shoulder Extension)  Level 1 (Yellow)    Row  Strengthening;Both;10 reps;Theraband    Theraband Level (Shoulder Row)  Level 1 (Yellow)    Retraction  Strengthening;Both;10 reps;Theraband   minimal range Lt      Shoulder Exercises: Pulleys   Flexion  --   10 sec hold  x 10 reps    Scaption  --   10 sec hold x 10 reps      Shoulder Exercises: Therapy Ball   Flexion  Both;15 reps   rolling ball out on large red ball    Other Therapy Ball Exercises  scapular depression pressing into the large green ball 5 sec x 10     Other Therapy Ball Exercises  ball exercises with ~8 inch ball holding ball in both hands and working with functional movements - inc. elbow flex/ext; press out; wood chopping; pushing ball overhead working on shoulder flexion all in sitting.   shoulder at 90 deg rolling ball up and down and side to side on wall       Shoulder Exercises: Stretch   External Rotation Stretch  5 reps;10 seconds   standing with cane      Manual Therapy   Manual therapy comments  pt supine hooklying supported on bolster     Soft tissue mobilization  deep tissue work  to pt tolerance Lt shoulder girdle anterior/deltoid/biceps/upper trap/leveator/teres     Passive ROM  Lt shoulder flexion; scaption, and ER in scaption - to tissue limits and tolerance             PT Education - 12/25/18 1030    Education Details  HEP    Person(s) Educated  Patient    Methods  Explanation;Demonstration;Tactile cues;Verbal cues;Handout    Comprehension  Verbalized understanding;Returned demonstration;Verbal cues required;Tactile cues required       PT Short Term Goals - 12/10/18 1158      PT SHORT TERM GOAL #1   Title  Instruct patient in improved posture and alignment with pt to demonstrate activation of posterior shoulder girdle musculature 12/10/2018    Time  6    Period  Weeks    Status  Partially Met      PT SHORT TERM GOAL #2   Title  Independent in initial HEP 12/10/2018    Baseline  encouraged compliance    Time  6    Period  Weeks    Status  Partially Met      PT SHORT TERM GOAL #3   Title  Instruct patient in appropriate exercises for home pool 12/10/2018    Time  6    Period  Weeks    Status  Achieved        PT Long Term Goals - 12/20/18 1439      PT LONG TERM GOAL #1   Title  Improve posture and alignment with patient to demonstrate improved upright posture with posterior shoulder girdle engaged 01/21/2019    Time  12    Period  Weeks    Status  On-going      PT LONG TERM GOAL #2   Title  Increase AROM Lt shoulder to ~ 120 degrees shoulder elevation and 50-60 degrees ER and hand to sacrum IR 01/21/2019    Baseline  pt able to get hand to sacrum; continued limitation with flexion/ ER    Time  12    Period  Weeks    Status  Partially Met      PT LONG TERM GOAL #3   Title  Patient reports using Lt UE for functional activities including washing face; turning on/off light switch; etc 01/21/2019    Baseline  improved functional use reported    Time  12    Period  Weeks    Status  Partially Met  PT LONG TERM GOAL #4   Title   Independent in HEP 01/21/2019    Time  12    Period  Weeks    Status  On-going      PT LONG TERM GOAL #5   Title  Improve FOTO =/< 42% limited by 01/21/2019    Time  12    Period  Weeks    Status  On-going            Plan - 12/25/18 1011    Clinical Impression Statement  Patient reports that her shoulder is sore and tight. MD pleased with progress. She is working on exercises at home and exercising in the pool. Added light resistive exercise with yellow TB. Gradually progressing toward goals.    Rehab Potential  Good    PT Frequency  2x / week    PT Duration  12 weeks    PT Treatment/Interventions  Patient/family education;ADLs/Self Care Home Management;Cryotherapy;Moist Heat;Iontophoresis 29m/ml Dexamethasone;Therapeutic activities;Therapeutic exercise;Neuromuscular re-education;Manual techniques;Dry needling    PT Next Visit Plan  progress with AAROM; PROM; AROM as tolerated. Continue with 2x/wk to progress with ROM and strengthening .    PT Home Exercise Plan  Access Code: [email or web address]   ( patient has a pool at her home )    Consulted and Agree with Plan of Care  Patient       Patient will benefit from skilled therapeutic intervention in order to improve the following deficits and impairments:  Postural dysfunction, Improper body mechanics, Pain, Increased fascial restricitons, Increased muscle spasms, Hypomobility, Decreased strength, Decreased mobility, Decreased range of motion, Decreased activity tolerance, Impaired UE functional use  Visit Diagnosis: 1. Acute pain of left shoulder   2. Other symptoms and signs involving the musculoskeletal system   3. Abnormal posture   4. Muscle weakness (generalized)        Problem List Patient Active Problem List   Diagnosis Date Noted  . Closed fracture of left proximal humerus 09/26/2018  . Squamous cell carcinoma 03/05/2017  . Echocardiogram shows left ventricular diastolic dysfunction 038/17/7116 . Heart palpitations  07/25/2016  . Need for vaccination for pneumococcus 07/13/2016  . Osteoarthritis of hand, right 08/06/2015  . Diabetes mellitus type 2, uncomplicated ([place] 057/90/3833 . Lung cancer, [place] lobe ([place] 10/09/2014  . Hematochezia 04/01/2013  . H/O colon cancer, stage II 09/19/2011  . Depression with anxiety 09/19/2011  . Hyperlipidemia 09/19/2011    Priest Lockridge PNilda SimmerPT, MPH  12/25/2018, 11:05 AM  [facility] [address]Phone: [phone]  Fax:  [phone] Name: Kelli GAMMMRN: [id number] of Birth: [birth year]

## 2018-12-25 NOTE — Patient Instructions (Signed)
Access Code: [email or web address]  URL: [email or web address]  Date: 12/25/2018  Prepared by: Gillermo Murdoch   Exercises  Seated Cervical Retraction - 10 reps - 1 sets - 3x daily - 7x weekly  Standing Scapular Retraction - 10 reps - 1 sets - 10 hold - 3x daily - 7x weekly  Shoulder External Rotation and Scapular Retraction - 10 reps - 1 sets - hold - 3x daily - 7x weekly  Seated Shoulder External Rotation AAROM with Cane and Hand in Neutral - 10 reps - 1 sets - 10 sec hold - 3x daily - 7x weekly  Seated Shoulder Flexion Slide at Table Top with Forearm in Neutral - 5-10 reps - 1 sets - 10 sec hold - 3x daily - 7x weekly  Circular Shoulder Pendulum with Table Support - 3 reps - 1 sets - 30 sec hold - 2x daily - 7x weekly  Seated Shoulder Flexion Towel Slide at Table Top - 10 reps - 1 sets - 10 sec hold - 2x daily - 7x weekly  Seated Shoulder Abduction Towel Slide at Table Top - 10 reps - 1 sets - 10 sec hold - 2x daily - 7x weekly  Seated Thoracic Lumbar Extension - 10 reps - 1 sets - 10 sec hold - 2x daily - 7x weekly  Shoulder External Rotation and Scapular Retraction with Resistance - 10 reps - 3 sets - 2x daily - 7x weekly  Scapular Retraction with Resistance - 10 reps - 3 sets - 2x daily - 7x weekly  Scapular Retraction with Resistance Advanced - 10 reps - 3 sets - 2x daily - 7x weekly

## 2018-12-26 ENCOUNTER — Other Ambulatory Visit: Payer: Self-pay | Admitting: Family Medicine

## 2018-12-26 DIAGNOSIS — R69 Illness, unspecified: Secondary | ICD-10-CM | POA: Diagnosis not present

## 2019-01-01 ENCOUNTER — Other Ambulatory Visit: Payer: Self-pay

## 2019-01-01 ENCOUNTER — Encounter: Payer: Self-pay | Admitting: Rehabilitative and Restorative Service Providers"

## 2019-01-01 ENCOUNTER — Ambulatory Visit (INDEPENDENT_AMBULATORY_CARE_PROVIDER_SITE_OTHER): Payer: Medicare HMO | Admitting: Rehabilitative and Restorative Service Providers"

## 2019-01-01 DIAGNOSIS — R29898 Other symptoms and signs involving the musculoskeletal system: Secondary | ICD-10-CM

## 2019-01-01 DIAGNOSIS — M25512 Pain in left shoulder: Secondary | ICD-10-CM

## 2019-01-01 DIAGNOSIS — R293 Abnormal posture: Secondary | ICD-10-CM | POA: Diagnosis not present

## 2019-01-01 DIAGNOSIS — M6281 Muscle weakness (generalized): Secondary | ICD-10-CM | POA: Diagnosis not present

## 2019-01-01 NOTE — Therapy (Signed)
[place]  [place], [place], [address] Phone: [phone]   Fax:  [phone]  Physical Therapy Treatment  Patient Details  Name: Kelli [name] MRN: [id number] Date of Birth: December 25, 1945 Referring Provider (PT): Dr [name]    Encounter Date: 01/01/2019  PT End of Session - 01/01/19 1120    Visit Number  15    Number of Visits  24    Date for PT Re-Evaluation  01/21/19    PT Start Time  1105    PT Stop Time  1151    PT Time Calculation (min)  46 min    Activity Tolerance  Patient tolerated treatment well       Past Medical History:  Diagnosis Date  . Anxiety   . Arthritis   . Borderline diabetic   . Colon cancer ([place])    colon ca dx 02/2009, lung cancer  . Depression   . Depression with anxiety 09/19/2011  . H/O colon cancer, stage II 09/19/2011   T3N0  Cecum Sept 2010  . High cholesterol   . Hyperlipidemia 09/19/2011  . Lung nodules 09/19/2011   granulomas  . Urinary urgency     Past Surgical History:  Procedure Laterality Date  . APPENDECTOMY    . BREAST SURGERY  1978   augmentation  . COLON SURGERY     2010  . CRYO INTERCOSTAL NERVE BLOCK N/A 10/05/2014   Procedure: CRYO INTERCOSTAL NERVE BLOCK;  Surgeon: [name], MD;  Location: [facility];  Service: Thoracic;  Laterality: N/A;  . LYMPH NODE DISSECTION Right 10/05/2014   Procedure: LYMPH NODE DISSECTION;  Surgeon: [name], MD;  Location: [facility];  Service: Thoracic;  Laterality: Right;  . ORIF HUMERUS FRACTURE Left 10/01/2018   Procedure: OPEN REDUCTION INTERNAL FIXATION (ORIF) LEFT PROXIMAL HUMERUS FRACTURE;  Surgeon: [name], MD;  Location: [facility];  Service: Orthopedics;  Laterality: Left;  . PARTIAL COLECTOMY Right 2010  . SEGMENTECOMY Right 10/05/2014   Procedure: right lower lobe SEGMENTECTOMY;  Surgeon: [name], MD;  Location: [facility];  Service: Thoracic;  Laterality: Right;  [name] VIDEO  ASSISTED THORACOSCOPY (VATS)/WEDGE RESECTION Right 10/05/2014   Procedure: Right VIDEO ASSISTED THORACOSCOPY (VATS) with right lower lobe lung nodule WEDGE RESECTION;  Surgeon: [name], MD;  Location: [facility];  Service: Thoracic;  Laterality: Right;    There were no vitals filed for this visit.                    [facility] Adult PT Treatment/Exercise - 01/01/19 0001      Shoulder Exercises: Prone   Other Prone Exercises  quadraped wt bearing through Lt UE; sit back toward child's pose for stretch into Lt shoulder flexion 30sec hold x 3 reps       Shoulder Exercises: Sidelying   Other Sidelying Exercises  sliding Lt hand toward back pocket with elbow relaxed for prolonged stretch 20-40 sec x 3 reps       Shoulder Exercises: Standing   Other Standing Exercises  pendulum with Lt arm       Shoulder Exercises: Pulleys   Flexion  --   10 sec hold x 10 reps    Scaption  --   10 sec hold x 10 reps    Other Pulley Exercises  IR/ER shoulders at 90 deg bringing arms in and out in horzontal ab/adduction       Shoulder Exercises: Stretch  External Rotation Stretch  5 reps;10 seconds   2 sets standing with cane    Other Shoulder Stretches  ER standing at doorway elbow 90 deg flexion and at side - t turning body to Rt 30 sec hold x 3 reps      Manual Therapy   Manual therapy comments  pt supine hooklying supported on bolster - Rt sidelying     Soft tissue mobilization  deep tissue work to pt tolerance Lt shoulder girdle anterior/deltoid/biceps/upper trap/leveator/teres     Myofascial Release  Lt pec release    Scapular Mobilization  Lt scapula in all directions     Passive ROM  Lt shoulder flexion; scaption, and ER in scaption in supine and shoulder extension and IR in sidelying  - to tissue limits and tolerance               PT Short Term Goals - 12/10/18 1158      PT SHORT TERM GOAL #1   Title  Instruct patient in improved posture and alignment with pt to  demonstrate activation of posterior shoulder girdle musculature 12/10/2018    Time  6    Period  Weeks    Status  Partially Met      PT SHORT TERM GOAL #2   Title  Independent in initial HEP 12/10/2018    Baseline  encouraged compliance    Time  6    Period  Weeks    Status  Partially Met      PT SHORT TERM GOAL #3   Title  Instruct patient in appropriate exercises for home pool 12/10/2018    Time  6    Period  Weeks    Status  Achieved        PT Long Term Goals - 12/20/18 1439      PT LONG TERM GOAL #1   Title  Improve posture and alignment with patient to demonstrate improved upright posture with posterior shoulder girdle engaged 01/21/2019    Time  12    Period  Weeks    Status  On-going      PT LONG TERM GOAL #2   Title  Increase AROM Lt shoulder to ~ 120 degrees shoulder elevation and 50-60 degrees ER and hand to sacrum IR 01/21/2019    Baseline  pt able to get hand to sacrum; continued limitation with flexion/ ER    Time  12    Period  Weeks    Status  Partially Met      PT LONG TERM GOAL #3   Title  Patient reports using Lt UE for functional activities including washing face; turning on/off light switch; etc 01/21/2019    Baseline  improved functional use reported    Time  12    Period  Weeks    Status  Partially Met      PT LONG TERM GOAL #4   Title  Independent in HEP 01/21/2019    Time  12    Period  Weeks    Status  On-going      PT LONG TERM GOAL #5   Title  Improve FOTO =/< 42% limited by 01/21/2019    Time  12    Period  Weeks    Status  On-going            Plan - 01/01/19 1121    Clinical Impression Statement  Patient reports that she is working in her pool with her exercises. She can do  almost everything she needs to do - but can't reach arm to the side or overhead. Patient remains limited at all end ranges with tight shoulder capsule. She will benefit from continued PT to address capsular and joint tightness thus gaining mobility and therefore  function of Lt UE.    Rehab Potential  Good    PT Frequency  2x / week    PT Duration  12 weeks    PT Treatment/Interventions  Patient/family education;ADLs/Self Care Home Management;Cryotherapy;Moist Heat;Iontophoresis 25m/ml Dexamethasone;Therapeutic activities;Therapeutic exercise;Neuromuscular re-education;Manual techniques;Dry needling    PT Next Visit Plan  progress with AAROM; PROM; AROM as tolerated. Continue with 2x/wk to progress with ROM and strengthening .    PT Home Exercise Plan  Access Code: [email or web address]   ( patient has a pool at her home )    Consulted and Agree with Plan of Care  Patient       Patient will benefit from skilled therapeutic intervention in order to improve the following deficits and impairments:  Postural dysfunction, Improper body mechanics, Pain, Increased fascial restricitons, Increased muscle spasms, Hypomobility, Decreased strength, Decreased mobility, Decreased range of motion, Decreased activity tolerance, Impaired UE functional use  Visit Diagnosis: 1. Acute pain of left shoulder   2. Other symptoms and signs involving the musculoskeletal system   3. Abnormal posture   4. Muscle weakness (generalized)        Problem List Patient Active Problem List   Diagnosis Date Noted  . Closed fracture of left proximal humerus 09/26/2018  . Squamous cell carcinoma 03/05/2017  . Echocardiogram shows left ventricular diastolic dysfunction 098/33/8250 . Heart palpitations 07/25/2016  . Need for vaccination for pneumococcus 07/13/2016  . Osteoarthritis of hand, right 08/06/2015  . Diabetes mellitus type 2, uncomplicated ([facility] 053/97/6734 . Lung cancer, lower lobe ([place] 10/09/2014  . Hematochezia 04/01/2013  . H/O colon cancer, stage II 09/19/2011  . Depression with anxiety 09/19/2011  . Hyperlipidemia 09/19/2011     PNilda SimmerPT, MPH  01/01/2019, 1:18 PM  [facility] [address]Phone: [phone]  Fax:  [phone] Name: Kelli DEWBERRYMRN: [id number] of Birth: 813-May-1947

## 2019-01-07 ENCOUNTER — Ambulatory Visit: Payer: Medicare HMO | Admitting: Rehabilitative and Restorative Service Providers"

## 2019-01-07 ENCOUNTER — Other Ambulatory Visit: Payer: Self-pay

## 2019-01-07 ENCOUNTER — Encounter: Payer: Self-pay | Admitting: Rehabilitative and Restorative Service Providers"

## 2019-01-07 DIAGNOSIS — M6281 Muscle weakness (generalized): Secondary | ICD-10-CM | POA: Diagnosis not present

## 2019-01-07 DIAGNOSIS — M25512 Pain in left shoulder: Secondary | ICD-10-CM

## 2019-01-07 DIAGNOSIS — R29898 Other symptoms and signs involving the musculoskeletal system: Secondary | ICD-10-CM

## 2019-01-07 DIAGNOSIS — R293 Abnormal posture: Secondary | ICD-10-CM

## 2019-01-07 NOTE — Therapy (Signed)
[place], [place], [address] Phone: [phone]   Fax:  [phone]  Physical Therapy Treatment  Patient Details  Name: Kelli [name] MRN: [id number] Date of Birth: Oct 23, 1945 Referring Provider (PT): Dr [name]    Encounter Date: 01/07/2019  PT End of Session - 01/07/19 1103    Visit Number  16    Number of Visits  24    Date for PT Re-Evaluation  01/21/19    PT Start Time  1102    PT Stop Time  1148    PT Time Calculation (min)  46 min    Activity Tolerance  Patient tolerated treatment well       Past Medical History:  Diagnosis Date  . Anxiety   . Arthritis   . Borderline diabetic   . Colon cancer ([place])    colon ca dx 02/2009, lung cancer  . Depression   . Depression with anxiety 09/19/2011  . H/O colon cancer, stage II 09/19/2011   T3N0  Cecum Sept 2010  . High cholesterol   . Hyperlipidemia 09/19/2011  . Lung nodules 09/19/2011   granulomas  . Urinary urgency     Past Surgical History:  Procedure Laterality Date  . APPENDECTOMY    . BREAST SURGERY  1978   augmentation  . COLON SURGERY     2010  . CRYO INTERCOSTAL NERVE BLOCK N/A 10/05/2014   Procedure: CRYO INTERCOSTAL NERVE BLOCK;  Surgeon: [name], MD;  Location: [facility];  Service: Thoracic;  Laterality: N/A;  . LYMPH NODE DISSECTION Right 10/05/2014   Procedure: LYMPH NODE DISSECTION;  Surgeon: [name], MD;  Location: [facility];  Service: Thoracic;  Laterality: Right;  . ORIF HUMERUS FRACTURE Left 10/01/2018   Procedure: OPEN REDUCTION INTERNAL FIXATION (ORIF) LEFT PROXIMAL HUMERUS FRACTURE;  Surgeon: [name], MD;  Location: [facility];  Service: Orthopedics;  Laterality: Left;  . PARTIAL COLECTOMY Right 2010  . SEGMENTECOMY Right 10/05/2014   Procedure: right lower lobe SEGMENTECTOMY;  Surgeon: [name], MD;  Location: [facility];  Service: Thoracic;  Laterality: Right;  [name] VIDEO  ASSISTED THORACOSCOPY (VATS)/WEDGE RESECTION Right 10/05/2014   Procedure: Right VIDEO ASSISTED THORACOSCOPY (VATS) with right lower lobe lung nodule WEDGE RESECTION;  Surgeon: [name], MD;  Location: [facility];  Service: Thoracic;  Laterality: Right;    There were no vitals filed for this visit.  Subjective Assessment - 01/07/19 1104    Subjective  Kelli [name] reports that she has a new way to exercise her shoulder - stepping into the doorway and then squatting down.    Currently in Pain?  No/denies                       [facility] Adult PT Treatment/Exercise - 01/07/19 0001      Shoulder Exercises: Prone   Other Prone Exercises  quadraped wt bearing through Lt UE; sit back toward child's pose for stretch into Lt shoulder flexion 30sec hold x 3 reps - added partial press up x 5; trial of alternate shd flexion x 10 each UE - difficulty lifting Lt UE       Shoulder Exercises: Sidelying   Other Sidelying Exercises  hands together working on reaching and protraction of shoulder as PT stabalizes shoulder x 8 reps       Shoulder Exercises: Standing   Flexion  Left;AROM;5 reps   reaching up on wall then  squatting to inc shd flex    Other Standing Exercises  hand resting on wall then partially lifting hand/arm from wall to work on strengthening and scapular stabilization       Shoulder Exercises: Pulleys   Flexion  --   10 sec hold x 10 reps    Scaption  --   10 sec hold x 10 reps    Other Pulley Exercises  IR/ER shoulders at 90 deg bringing arms in and out in horzontal ab/adduction       Shoulder Exercises: Therapy Ball   Right/Left Limitations  stabilization with ball on wall in various positions; ER with ball at dorsum of hand varying positions for circles CW/CCW x 10-20 reps Lt UE       Manual Therapy   Manual therapy comments  pt supine hooklying supported on bolster - Rt sidelying     Soft tissue mobilization  deep tissue work to pt tolerance Lt shoulder girdle teres  and lateral scapular border; anterior/deltoid/biceps/upper trap/leveator/teres     Scapular Mobilization  Lt scapula in all directions     Passive ROM  Lt shoulder flexion; scaption, and ER in scaption in supine and shoulder extension and IR in sidelying  - to tissue limits and tolerance               PT Short Term Goals - 12/10/18 1158      PT SHORT TERM GOAL #1   Title  Instruct patient in improved posture and alignment with pt to demonstrate activation of posterior shoulder girdle musculature 12/10/2018    Time  6    Period  Weeks    Status  Partially Met      PT SHORT TERM GOAL #2   Title  Independent in initial HEP 12/10/2018    Baseline  encouraged compliance    Time  6    Period  Weeks    Status  Partially Met      PT SHORT TERM GOAL #3   Title  Instruct patient in appropriate exercises for home pool 12/10/2018    Time  6    Period  Weeks    Status  Achieved        PT Long Term Goals - 12/20/18 1439      PT LONG TERM GOAL #1   Title  Improve posture and alignment with patient to demonstrate improved upright posture with posterior shoulder girdle engaged 01/21/2019    Time  12    Period  Weeks    Status  On-going      PT LONG TERM GOAL #2   Title  Increase AROM Lt shoulder to ~ 120 degrees shoulder elevation and 50-60 degrees ER and hand to sacrum IR 01/21/2019    Baseline  pt able to get hand to sacrum; continued limitation with flexion/ ER    Time  12    Period  Weeks    Status  Partially Met      PT LONG TERM GOAL #3   Title  Patient reports using Lt UE for functional activities including washing face; turning on/off light switch; etc 01/21/2019    Baseline  improved functional use reported    Time  12    Period  Weeks    Status  Partially Met      PT LONG TERM GOAL #4   Title  Independent in HEP 01/21/2019    Time  12    Period  Weeks    Status  On-going      PT LONG TERM GOAL #5   Title  Improve FOTO =/< 42% limited by 01/21/2019    Time  12     Period  Weeks    Status  On-going            Plan - 01/07/19 1104    Clinical Impression Statement  Continued gradual progress. Patient is now working on stretching more consistently. She continues to have significant tightness in the shoulder girdle with scapular dyskinesis with elevation. Worked today to free the scapula with deep tissue work and passive stretch whild stabalizing the scapula. Good improvement in mobilty and range following treatment. May benefit from trial of DN to scapular/shoulder musculture. she will consider.    Rehab Potential  Good    PT Frequency  2x / week    PT Duration  12 weeks    PT Treatment/Interventions  Patient/family education;ADLs/Self Care Home Management;Cryotherapy;Moist Heat;Iontophoresis 42m/ml Dexamethasone;Therapeutic activities;Therapeutic exercise;Neuromuscular re-education;Manual techniques;Dry needling    PT Next Visit Plan  progress with AAROM; PROM; AROM as tolerated. Continue with 2x/wk to progress with ROM and strengthening; working on disassociation of scapula from shoulder; consider trail of dn    PT Home Exercise Plan  Access Code: [email or web address]    ( patient has a pool at her home )    Consulted and Agree with Plan of Care  Patient       Patient will benefit from skilled therapeutic intervention in order to improve the following deficits and impairments:  Postural dysfunction, Improper body mechanics, Pain, Increased fascial restricitons, Increased muscle spasms, Hypomobility, Decreased strength, Decreased mobility, Decreased range of motion, Decreased activity tolerance, Impaired UE functional use  Visit Diagnosis: 1. Acute pain of left shoulder   2. Other symptoms and signs involving the musculoskeletal system   3. Abnormal posture   4. Muscle weakness (generalized)        Problem List Patient Active Problem List   Diagnosis Date Noted  . Closed fracture of left proximal humerus 09/26/2018  . Squamous cell carcinoma 03/05/2017   . Echocardiogram shows left ventricular diastolic dysfunction 035/57/3220 . Heart palpitations 07/25/2016  . Need for vaccination for pneumococcus 07/13/2016  . Osteoarthritis of hand, right 08/06/2015  . Diabetes mellitus type 2, uncomplicated ([facility] 025/42/7062 . Lung cancer, lower lobe ([place] 10/09/2014  . Hematochezia 04/01/2013  . H/O colon cancer, stage II 09/19/2011  . Depression with anxiety 09/19/2011  . Hyperlipidemia 09/19/2011     PNilda SimmerPT, MPH  01/07/2019, 12:08 PM  [facility] [address]Phone: [phone]  Fax:  [phone] Name: Kelli ANGMRN: [id number] of Birth: 824-Nov-1947

## 2019-01-08 ENCOUNTER — Encounter: Payer: Medicare HMO | Admitting: Physical Therapy

## 2019-01-08 ENCOUNTER — Telehealth: Payer: Self-pay

## 2019-01-08 NOTE — Telephone Encounter (Signed)
Courtney from [facility] called, she wanted provider to be aware that pt had Bone density testing on 12/09/18 by [facility] in her home. Results may take up to 60 days. No other info was provided.

## 2019-01-09 ENCOUNTER — Encounter: Payer: Self-pay | Admitting: Osteopathic Medicine

## 2019-01-10 ENCOUNTER — Ambulatory Visit (INDEPENDENT_AMBULATORY_CARE_PROVIDER_SITE_OTHER): Payer: Medicare HMO | Admitting: Rehabilitative and Restorative Service Providers"

## 2019-01-10 ENCOUNTER — Ambulatory Visit (INDEPENDENT_AMBULATORY_CARE_PROVIDER_SITE_OTHER): Payer: Medicare HMO | Admitting: Osteopathic Medicine

## 2019-01-10 ENCOUNTER — Other Ambulatory Visit: Payer: Self-pay

## 2019-01-10 ENCOUNTER — Encounter: Payer: Self-pay | Admitting: Rehabilitative and Restorative Service Providers"

## 2019-01-10 DIAGNOSIS — R293 Abnormal posture: Secondary | ICD-10-CM | POA: Diagnosis not present

## 2019-01-10 DIAGNOSIS — M25512 Pain in left shoulder: Secondary | ICD-10-CM | POA: Diagnosis not present

## 2019-01-10 DIAGNOSIS — E119 Type 2 diabetes mellitus without complications: Secondary | ICD-10-CM

## 2019-01-10 DIAGNOSIS — M6281 Muscle weakness (generalized): Secondary | ICD-10-CM | POA: Diagnosis not present

## 2019-01-10 DIAGNOSIS — R29898 Other symptoms and signs involving the musculoskeletal system: Secondary | ICD-10-CM | POA: Diagnosis not present

## 2019-01-10 LAB — GLUCOSE, POCT (MANUAL RESULT ENTRY): POC Glucose: 234 mg/dl — AB (ref 70–99)

## 2019-01-10 NOTE — Therapy (Signed)
[place], [place], [address] Phone: [phone]   Fax:  [phone]  Physical Therapy Treatment  Patient Details  Name: Kelli [name] MRN: [id number] Date of Birth: 02/08/1946 Referring Provider (PT): Dr [name]    Encounter Date: 01/10/2019  PT End of Session - 01/10/19 1114    Visit Number  17    Number of Visits  24    Date for PT Re-Evaluation  01/21/19    PT Start Time  1104    PT Stop Time  1149    PT Time Calculation (min)  45 min    Activity Tolerance  Patient tolerated treatment well       Past Medical History:  Diagnosis Date  . Anxiety   . Arthritis   . Borderline diabetic   . Colon cancer ([place])    colon ca dx 02/2009, lung cancer  . Depression   . Depression with anxiety 09/19/2011  . H/O colon cancer, stage II 09/19/2011   T3N0  Cecum Sept 2010  . High cholesterol   . Hyperlipidemia 09/19/2011  . Lung nodules 09/19/2011   granulomas  . Urinary urgency     Past Surgical History:  Procedure Laterality Date  . APPENDECTOMY    . BREAST SURGERY  1978   augmentation  . COLON SURGERY     2010  . CRYO INTERCOSTAL NERVE BLOCK N/A 10/05/2014   Procedure: CRYO INTERCOSTAL NERVE BLOCK;  Surgeon: [name], MD;  Location: [facility];  Service: Thoracic;  Laterality: N/A;  . LYMPH NODE DISSECTION Right 10/05/2014   Procedure: LYMPH NODE DISSECTION;  Surgeon: [name], MD;  Location: [facility];  Service: Thoracic;  Laterality: Right;  . ORIF HUMERUS FRACTURE Left 10/01/2018   Procedure: OPEN REDUCTION INTERNAL FIXATION (ORIF) LEFT PROXIMAL HUMERUS FRACTURE;  Surgeon: [name], MD;  Location: [facility];  Service: Orthopedics;  Laterality: Left;  . PARTIAL COLECTOMY Right 2010  . SEGMENTECOMY Right 10/05/2014   Procedure: right lower lobe SEGMENTECTOMY;  Surgeon: [name], MD;  Location: [facility];  Service: Thoracic;  Laterality: Right;  [name] VIDEO  ASSISTED THORACOSCOPY (VATS)/WEDGE RESECTION Right 10/05/2014   Procedure: Right VIDEO ASSISTED THORACOSCOPY (VATS) with right lower lobe lung nodule WEDGE RESECTION;  Surgeon: [name], MD;  Location: [facility];  Service: Thoracic;  Laterality: Right;    There were no vitals filed for this visit.  Subjective Assessment - 01/10/19 1114    Subjective  Feels she has kept some increase in the motioin gained at the last visit. Ready to try the DN    Currently in Pain?  No/denies                       [facility] Adult PT Treatment/Exercise - 01/10/19 0001      Shoulder Exercises: Pulleys   Flexion  --   10 sec hold x 10 reps    Scaption  --   10 sec hold x 10 reps    Other Pulley Exercises  IR/ER shoulders at 90 deg bringing arms in and out in horzontal ab/adduction       Manual Therapy   Manual therapy comments  pt supine hooklying supported on bolster    Soft tissue mobilization  deep tissue work to pt tolerance Lt shoulder girdle teres and lateral scapular border; anterior/deltoid/biceps/upper trap/leveator/teres     Myofascial Release  Lt pec release    Scapular  Mobilization  Lt scapula in all directions     Passive ROM  Lt shoulder flexion; scaption, and ER in scaption in supine and shoulder extension and IR in sidelying  - to tissue limits and tolerance       Trigger Point Dry Needling - 01/10/19 0001    Consent Given?  Yes    Education Handout Provided  Yes    Other Dry Needling  Lt    Pectoralis Major Response  Palpable increased muscle length    Pectoralis Minor Response  Palpable increased muscle length    Deltoid Response  Palpable increased muscle length   anterior fibers    Teres major Response  Palpable increased muscle length    Teres minor Response  Palpable increased muscle length    Biceps Response  Palpable increased muscle length           PT Education - 01/10/19 1116    Education Details  DN    Person(s) Educated  Patient    Methods   Explanation    Comprehension  Verbalized understanding       PT Short Term Goals - 12/10/18 1158      PT SHORT TERM GOAL #1   Title  Instruct patient in improved posture and alignment with pt to demonstrate activation of posterior shoulder girdle musculature 12/10/2018    Time  6    Period  Weeks    Status  Partially Met      PT SHORT TERM GOAL #2   Title  Independent in initial HEP 12/10/2018    Baseline  encouraged compliance    Time  6    Period  Weeks    Status  Partially Met      PT SHORT TERM GOAL #3   Title  Instruct patient in appropriate exercises for home pool 12/10/2018    Time  6    Period  Weeks    Status  Achieved        PT Long Term Goals - 12/20/18 1439      PT LONG TERM GOAL #1   Title  Improve posture and alignment with patient to demonstrate improved upright posture with posterior shoulder girdle engaged 01/21/2019    Time  12    Period  Weeks    Status  On-going      PT LONG TERM GOAL #2   Title  Increase AROM Lt shoulder to ~ 120 degrees shoulder elevation and 50-60 degrees ER and hand to sacrum IR 01/21/2019    Baseline  pt able to get hand to sacrum; continued limitation with flexion/ ER    Time  12    Period  Weeks    Status  Partially Met      PT LONG TERM GOAL #3   Title  Patient reports using Lt UE for functional activities including washing face; turning on/off light switch; etc 01/21/2019    Baseline  improved functional use reported    Time  12    Period  Weeks    Status  Partially Met      PT LONG TERM GOAL #4   Title  Independent in HEP 01/21/2019    Time  12    Period  Weeks    Status  On-going      PT LONG TERM GOAL #5   Title  Improve FOTO =/< 42% limited by 01/21/2019    Time  12    Period  Weeks    Status  On-going            Plan - 01/10/19 1114    Clinical Impression Statement  Tolerated trial of DN well with needling through pecs; anterior shoulder into biceps. Significant muscular tightness persists. Patient may  benefit from continued DN treatment.    Rehab Potential  Good    PT Frequency  2x / week    PT Duration  12 weeks    PT Treatment/Interventions  Patient/family education;ADLs/Self Care Home Management;Cryotherapy;Moist Heat;Iontophoresis 52m/ml Dexamethasone;Therapeutic activities;Therapeutic exercise;Neuromuscular re-education;Manual techniques;Dry needling    PT Next Visit Plan  progress with AAROM; PROM; AROM as tolerated. Continue with 2x/wk to progress with ROM and strengthening; working on disassociation of scapula from shoulder; assess response to trail of dn    PT Home Exercise Plan  Access Code: [email or web address]    ( patient has a pool at her home )    Consulted and Agree with Plan of Care  Patient       Patient will benefit from skilled therapeutic intervention in order to improve the following deficits and impairments:  Postural dysfunction, Improper body mechanics, Pain, Increased fascial restricitons, Increased muscle spasms, Hypomobility, Decreased strength, Decreased mobility, Decreased range of motion, Decreased activity tolerance, Impaired UE functional use  Visit Diagnosis: 1. Acute pain of left shoulder   2. Other symptoms and signs involving the musculoskeletal system   3. Abnormal posture   4. Muscle weakness (generalized)        Problem List Patient Active Problem List   Diagnosis Date Noted  . Closed fracture of left proximal humerus 09/26/2018  . Squamous cell carcinoma 03/05/2017  . Echocardiogram shows left ventricular diastolic dysfunction 011/08/1592 . Heart palpitations 07/25/2016  . Need for vaccination for pneumococcus 07/13/2016  . Osteoarthritis of hand, right 08/06/2015  . Diabetes mellitus [place] 2, [place]complicated [place] 058/59/2924 . Lung cancer, [place] lobe [place] 10/09/2014  . Hematochezia 04/01/2013  . H/O colon cancer, stage II 09/19/2011  . Depression with anxiety 09/19/2011  . Hyperlipidemia 09/19/2011    Haddon Fyfe PNilda SimmerPT, MPH  01/10/2019, 11:48  AM  [facility] [address]Phone: [phone]  Fax:  [phone] Name: SMALINI FLEMINGSMRN: [id number] of Birth: [birth year]

## 2019-01-10 NOTE — Patient Instructions (Signed)

## 2019-01-10 NOTE — Progress Notes (Signed)
Pt walked into the clinic requesting a check on her blood sugar. As per pt, she has been having elevated fasting b/s. Reading for b/s today was 234 mg/dl. Recently started injectable insulin. Pt wants to discuss changing current DM meds regimen with provider. Pt was informed to make a virtual/telephone visit next week with provider to discuss new plan of diabetes care. Pt agreed with recommendation. No other inquiries during Nurse visit.

## 2019-01-13 ENCOUNTER — Encounter: Payer: Self-pay | Admitting: Osteopathic Medicine

## 2019-01-13 ENCOUNTER — Ambulatory Visit (INDEPENDENT_AMBULATORY_CARE_PROVIDER_SITE_OTHER): Payer: Medicare HMO | Admitting: Osteopathic Medicine

## 2019-01-13 DIAGNOSIS — E119 Type 2 diabetes mellitus without complications: Secondary | ICD-10-CM

## 2019-01-13 MED ORDER — BASAGLAR KWIKPEN 100 UNIT/ML ~~LOC~~ SOPN: 10.0000 [IU] | PEN_INJECTOR | Freq: Every day | SUBCUTANEOUS | 11 refills | Status: DC

## 2019-01-13 NOTE — Patient Instructions (Signed)
For Diabetes  Continue Metformin as you are taking it  Measure fasting blood sugar every day: goal for now is to get this to 120-150  Increase daily Insulin dose by 2 units at a time, every 3 days, until fasting sugars are consistently 120-150, then continue at that dose  Plan to recheck A1C in 3 months  Plan to follow-up in the office sooner if you experience low sugars   Plan to follow-up in the office sooner if your fasting sugars are consistently high  We are STOPPING the other injectable medications

## 2019-01-13 NOTE — Progress Notes (Signed)
Virtual Visit via Video (App used: Doximity) Note  I connected with      Kelli [name] on 01/13/19 at 10:30 AM by a telemedicine application and verified that I am speaking with the correct person using two identifiers.  Patient is at home I am in office    I discussed the limitations of evaluation and management by telemedicine and the availability of in person appointments. The patient expressed understanding and agreed to proceed.  History of Present Illness: Kelli [name] is a 73 y.o. female who would like to discuss diabetes medication management    Fasting sugars are still up in the 200's-300's Cost of meds have been an issue     Observations/Objective: LMP  (LMP Unknown)  BP Readings from Last 3 Encounters:  12/11/18 135/67  11/26/18 109/64  10/01/18 (!) 120/59   Exam: Normal Speech.  NAD  Lab and Radiology Results Results for orders placed or performed in visit on 01/10/19 (from the past 72 hour(s))  POCT Glucose (CBG)     Status: Abnormal   Collection Time: 01/10/19 12:28 PM  Result Value Ref Range   POC Glucose 234 (A) 70 - 99 mg/dl   No results found.     Assessment and Plan: 73 y.o. female with The encounter diagnosis was Type 2 diabetes mellitus without complication, without long-term current use of insulin ([place]).   PDMP not reviewed this encounter. No orders of the defined types were placed in this encounter.  Meds ordered this encounter  Medications  . Insulin Glargine (BASAGLAR KWIKPEN) 100 UNIT/ML SOPN    Sig: Inject 0.1 mLs (10 Units total) into the skin at bedtime. Increase as directed: increase by 2 units every 3 days t fasting blood sugar goal 120-150. Max dose 60 units    Dispense:  5 pen    Refill:  11   Patient Instructions  For Diabetes  Continue Metformin as you are taking it  Measure fasting blood sugar every day: goal for now is to get this to 120-150  Increase daily Insulin dose by 2 units at a time, every 3 days,  until fasting sugars are consistently 120-150, then continue at that dose  Plan to recheck A1C in 3 months  Plan to follow-up in the office sooner if you experience low sugars   Plan to follow-up in the office sooner if your fasting sugars are consistently high  We are STOPPING the other injectable medications    Instructions sent via [place]. If MyChart not available, pt was given option for info via personal e-mail w/ no guarantee of protected health info over unsecured e-mail communication, and MyChart sign-up instructions were included.   Follow Up Instructions: Return in about 2 weeks (around 01/27/2019) for check on insulin (virtual visit please) .    I discussed the assessment and treatment plan with the patient. The patient was provided an opportunity to ask questions and all were answered. The patient agreed with the plan and demonstrated an understanding of the instructions.   The patient was advised to call back or seek an in-person evaluation if any new concerns, if symptoms worsen or if the condition fails to improve as anticipated.  25 minutes of non-face-to-face time was provided during this encounter.                      Historical information moved to improve visibility of documentation.  Past Medical History:  Diagnosis Date  . Anxiety   .  Arthritis   . Borderline diabetic   . Colon cancer ([place])    colon ca dx 02/2009, lung cancer  . Depression   . Depression with anxiety 09/19/2011  . H/O colon cancer, stage II 09/19/2011   T3N0  Cecum Sept 2010  . High cholesterol   . Hyperlipidemia 09/19/2011  . Lung nodules 09/19/2011   granulomas  . Urinary urgency    Past Surgical History:  Procedure Laterality Date  . APPENDECTOMY    . BREAST SURGERY  1978   augmentation  . COLON SURGERY     2010  . CRYO INTERCOSTAL NERVE BLOCK N/A 10/05/2014   Procedure: CRYO INTERCOSTAL NERVE BLOCK;  Surgeon: [name], MD;  Location: [facility];  Service:  Thoracic;  Laterality: N/A;  . LYMPH NODE DISSECTION Right 10/05/2014   Procedure: LYMPH NODE DISSECTION;  Surgeon: [name], MD;  Location: [facility];  Service: Thoracic;  Laterality: Right;  . ORIF HUMERUS FRACTURE Left 10/01/2018   Procedure: OPEN REDUCTION INTERNAL FIXATION (ORIF) LEFT PROXIMAL HUMERUS FRACTURE;  Surgeon: [name], MD;  Location: [facility];  Service: Orthopedics;  Laterality: Left;  . PARTIAL COLECTOMY Right 2010  . SEGMENTECOMY Right 10/05/2014   Procedure: right lower lobe SEGMENTECTOMY;  Surgeon: [name], MD;  Location: [facility];  Service: Thoracic;  Laterality: Right;  [name] VIDEO ASSISTED THORACOSCOPY (VATS)/WEDGE RESECTION Right 10/05/2014   Procedure: Right VIDEO ASSISTED THORACOSCOPY (VATS) with right lower lobe lung nodule WEDGE RESECTION;  Surgeon: [name], MD;  Location: [facility];  Service: Thoracic;  Laterality: Right;   Social History   Tobacco Use  . Smoking status: Former Smoker    Packs/day: 3.00    Years: 40.00    Pack years: 120.00    Types: Cigarettes    Quit date: 02/06/2009    Years since quitting: 9.9  . Smokeless tobacco: Never Used  Substance Use Topics  . Alcohol use: Yes    Alcohol/week: 0.0 standard drinks    Comment: 3-4 times a week 1-2 beers   family history includes Heart disease in her brother and mother.  Medications: Current Outpatient Medications  Medication Sig Dispense Refill  . AMBULATORY NON FORMULARY MEDICATION Single glucometer with lancets, test strips. Test daily.  Disp qs 3 months E11.9 1 each 0  . aspirin 81 MG tablet Take 81 mg by mouth daily.    . B Complex-C (B-COMPLEX WITH VITAMIN C) tablet Take 1 tablet by mouth daily.    . Cholecalciferol (VITAMIN D) 2000 UNITS tablet Take 2,000 Units by mouth daily.    . cloNIDine (CATAPRES) 0.1 MG tablet Take 1 tablet (0.1 mg total) by mouth at bedtime. 90 tablet 3  . FLUoxetine (PROZAC) 40 MG capsule Take 1 capsule (40 mg total) by mouth daily.  (Patient taking differently: Take 40 mg by mouth daily with lunch. ) 90 capsule 3  . gabapentin (NEURONTIN) 300 MG capsule Take 1 capsule (300 mg total) by mouth at bedtime as needed (insomnia). 90 capsule 3  . HYDROcodone-acetaminophen (NORCO/VICODIN) 5-325 MG tablet 1 po q 8-12 hrs prn pain 30 tablet 0  . ONETOUCH VERIO test strip TEST BLOOD SUGARS DAILY 100 strip 0  . pravastatin (PRAVACHOL) 80 MG tablet Take 1 tablet (80 mg total) by mouth daily. (Patient taking differently: Take 80 mg by mouth daily with lunch. ) 90 tablet 3  . Insulin Glargine (BASAGLAR KWIKPEN) 100 UNIT/ML SOPN Inject 0.1 mLs (10 Units total) into the skin at bedtime. Increase  as directed: increase by 2 units every 3 days t fasting blood sugar goal 120-150. Max dose 60 units 5 pen 11  . metFORMIN (GLUCOPHAGE-XR) 500 MG 24 hr tablet Take 2 tablets (1,000 mg total) by mouth daily with breakfast. (Patient taking differently: Take 1,000 mg by mouth daily with lunch. ) 180 tablet 3   No current facility-administered medications for this visit.    Allergies  Allergen Reactions  . Atorvastatin Other (See Comments)    Muscle cramps  . Glipizide Er Other (See Comments)    Shaking / tingling in fingers     PDMP not reviewed this encounter. No orders of the defined types were placed in this encounter.  Meds ordered this encounter  Medications  . Insulin Glargine (BASAGLAR KWIKPEN) 100 UNIT/ML SOPN    Sig: Inject 0.1 mLs (10 Units total) into the skin at bedtime. Increase as directed: increase by 2 units every 3 days t fasting blood sugar goal 120-150. Max dose 60 units    Dispense:  5 pen    Refill:  11

## 2019-01-14 ENCOUNTER — Other Ambulatory Visit: Payer: Self-pay

## 2019-01-14 ENCOUNTER — Ambulatory Visit: Payer: Medicare HMO | Admitting: Rehabilitative and Restorative Service Providers"

## 2019-01-14 ENCOUNTER — Encounter: Payer: Self-pay | Admitting: Rehabilitative and Restorative Service Providers"

## 2019-01-14 DIAGNOSIS — R293 Abnormal posture: Secondary | ICD-10-CM | POA: Diagnosis not present

## 2019-01-14 DIAGNOSIS — R29898 Other symptoms and signs involving the musculoskeletal system: Secondary | ICD-10-CM

## 2019-01-14 DIAGNOSIS — M25512 Pain in left shoulder: Secondary | ICD-10-CM | POA: Diagnosis not present

## 2019-01-14 DIAGNOSIS — M6281 Muscle weakness (generalized): Secondary | ICD-10-CM

## 2019-01-14 NOTE — Therapy (Signed)
[place]  [facility] [place], [place], [address] Phone: [phone]   Fax:  [phone]  Physical Therapy Treatment  Patient Details  Name: Kelli [name] MRN: [id number] Date of Birth: [birth year] Referring Provider (PT): Dr [name]    Encounter Date: 01/14/2019  PT End of Session - 01/14/19 1108    Visit Number  18    Number of Visits  24    Date for PT Re-Evaluation  01/21/19    PT Start Time  1102    PT Stop Time  1145    PT Time Calculation (min)  43 min    Activity Tolerance  Patient tolerated treatment well       Past Medical History:  Diagnosis Date  . Anxiety   . Arthritis   . Borderline diabetic   . Colon cancer ([place])    colon ca dx 02/2009, lung cancer  . Depression   . Depression with anxiety 09/19/2011  . H/O colon cancer, stage II 09/19/2011   T3N0  Cecum Sept 2010  . High cholesterol   . Hyperlipidemia 09/19/2011  . Lung nodules 09/19/2011   granulomas  . Urinary urgency     Past Surgical History:  Procedure Laterality Date  . APPENDECTOMY    . BREAST SURGERY  1978   augmentation  . COLON SURGERY     2010  . CRYO INTERCOSTAL NERVE BLOCK N/A 10/05/2014   Procedure: CRYO INTERCOSTAL NERVE BLOCK;  Surgeon: [name], MD;  Location: [place];  Service: Thoracic;  Laterality: N/A;  . LYMPH NODE DISSECTION Right 10/05/2014   Procedure: LYMPH NODE DISSECTION;  Surgeon: [name], MD;  Location: [place];  Service: Thoracic;  Laterality: Right;  . ORIF HUMERUS FRACTURE Left 10/01/2018   Procedure: OPEN REDUCTION INTERNAL FIXATION (ORIF) LEFT PROXIMAL HUMERUS FRACTURE;  Surgeon: [name], MD;  Location: [place];  Service: Orthopedics;  Laterality: Left;  . PARTIAL COLECTOMY Right 2010  . SEGMENTECOMY Right 10/05/2014   Procedure: right lower lobe SEGMENTECTOMY;  Surgeon: [name], MD;  Location: [place];  Service: Thoracic;  Laterality: Right;  [name] VIDEO  ASSISTED THORACOSCOPY (VATS)/WEDGE RESECTION Right 10/05/2014   Procedure: Right VIDEO ASSISTED THORACOSCOPY (VATS) with right lower lobe lung nodule WEDGE RESECTION;  Surgeon: [name], MD;  Location: [place];  Service: Thoracic;  Laterality: Right;    There were no vitals filed for this visit.  Subjective Assessment - 01/14/19 1109    Subjective  Patient reports that she thinks the needling has helped. She wants to try to the needling in the shoulder blade area.    Currently in Pain?  No/denies                       [facility] Adult PT Treatment/Exercise - 01/14/19 0001      Shoulder Exercises: Pulleys   Flexion  --   10 sec hold x 10 reps    Scaption  --   10 sec hold x 10 reps    Other Pulley Exercises  IR/ER shoulders at 90 deg bringing arms in and out in horzontal ab/adduction       Manual Therapy   Manual therapy comments  pt prone and supine hooklying supported on bolster    Soft tissue mobilization  deep tissue work to pt tolerance Lt shoulder girdle posterior shoulder girdle through upper trap, leveator, teres and lateral/medial scapular border; posterior deltoid  Myofascial Release  posterior shoulder girdle     Scapular Mobilization  Lt scapula in all planes    Passive ROM  Lt shoulder flexion; scaption, and ER in scaption in supine and shoulder extension and IR in sidelying  - to tissue limits and tolerance       Trigger Point Dry Needling - 01/14/19 0001    Consent Given?  Yes    Other Dry Needling  Lt     Upper Trapezius Response  Palpable increased muscle length;Twitch reponse elicited    Levator Scapulae Response  Palpable increased muscle length;Twitch response elicited    Rhomboids Response  Palpable increased muscle length    Subscapularis Response  Palpable increased muscle length    Lower trapezius Response  Palpable increased muscle length    Middle trapezius Response  Palpable increased muscle length             PT Short Term  Goals - 12/10/18 1158      PT SHORT TERM GOAL #1   Title  Instruct patient in improved posture and alignment with pt to demonstrate activation of posterior shoulder girdle musculature 12/10/2018    Time  6    Period  Weeks    Status  Partially Met      PT SHORT TERM GOAL #2   Title  Independent in initial HEP 12/10/2018    Baseline  encouraged compliance    Time  6    Period  Weeks    Status  Partially Met      PT SHORT TERM GOAL #3   Title  Instruct patient in appropriate exercises for home pool 12/10/2018    Time  6    Period  Weeks    Status  Achieved        PT Long Term Goals - 12/20/18 1439      PT LONG TERM GOAL #1   Title  Improve posture and alignment with patient to demonstrate improved upright posture with posterior shoulder girdle engaged 01/21/2019    Time  12    Period  Weeks    Status  On-going      PT LONG TERM GOAL #2   Title  Increase AROM Lt shoulder to ~ 120 degrees shoulder elevation and 50-60 degrees ER and hand to sacrum IR 01/21/2019    Baseline  pt able to get hand to sacrum; continued limitation with flexion/ ER    Time  12    Period  Weeks    Status  Partially Met      PT LONG TERM GOAL #3   Title  Patient reports using Lt UE for functional activities including washing face; turning on/off light switch; etc 01/21/2019    Baseline  improved functional use reported    Time  12    Period  Weeks    Status  Partially Met      PT LONG TERM GOAL #4   Title  Independent in HEP 01/21/2019    Time  12    Period  Weeks    Status  On-going      PT LONG TERM GOAL #5   Title  Improve FOTO =/< 42% limited by 01/21/2019    Time  12    Period  Weeks    Status  On-going            Plan - 01/14/19 1109    Clinical Impression Statement  Good response to DN - continued work through [employer]  shoudler girdle. Progressing gradually with shoulder movement including the quality of scapular stability and control.    Rehab Potential  Good    PT Frequency  2x / week     PT Duration  12 weeks    PT Treatment/Interventions  Patient/family education;ADLs/Self Care Home Management;Cryotherapy;Moist Heat;Iontophoresis 67m/ml Dexamethasone;Therapeutic activities;Therapeutic exercise;Neuromuscular re-education;Manual techniques;Dry needling    PT Next Visit Plan  progress with AAROM; PROM; AROM as tolerated. Continue with 2x/wk to progress with ROM and strengthening; working on disassociation of scapula from shoulder; continue with DN as indicated    PT Home Exercise Plan  Access Code: [email or web address]    ( patient has a pool at her home )    Consulted and Agree with Plan of Care  Patient       Patient will benefit from skilled therapeutic intervention in order to improve the following deficits and impairments:  Postural dysfunction, Improper body mechanics, Pain, Increased fascial restricitons, Increased muscle spasms, Hypomobility, Decreased strength, Decreased mobility, Decreased range of motion, Decreased activity tolerance, Impaired UE functional use  Visit Diagnosis: 1. Acute pain of left shoulder   2. Other symptoms and signs involving the musculoskeletal system   3. Abnormal posture   4. Muscle weakness (generalized)        Problem List Patient Active Problem List   Diagnosis Date Noted  . Closed fracture of left proximal humerus 09/26/2018  . Squamous cell carcinoma 03/05/2017  . Echocardiogram shows left ventricular diastolic dysfunction 071/29/2909 . Heart palpitations 07/25/2016  . Need for vaccination for pneumococcus 07/13/2016  . Osteoarthritis of hand, right 08/06/2015  . Diabetes mellitus type 2, uncomplicated ([place] 003/06/4994 . Lung cancer, lower lobe ([place] 10/09/2014  . Hematochezia 04/01/2013  . H/O colon cancer, stage II 09/19/2011  . Depression with anxiety 09/19/2011  . Hyperlipidemia 09/19/2011    Amaziah Ghosh PNilda SimmerPT, MPH  01/14/2019, 11:56 AM  [facility] [address]Phone: [phone]  Fax:  [phone] Name: SJENNETT TARBELLMRN: [id number] of Birth: [birth year]

## 2019-01-16 ENCOUNTER — Telehealth: Payer: Self-pay | Admitting: Osteopathic Medicine

## 2019-01-16 NOTE — Telephone Encounter (Signed)
Patient has already been seen for this. No further questions at this time.

## 2019-01-17 ENCOUNTER — Ambulatory Visit: Payer: Medicare HMO | Admitting: Physical Therapy

## 2019-01-17 ENCOUNTER — Encounter: Payer: Self-pay | Admitting: Physical Therapy

## 2019-01-17 ENCOUNTER — Other Ambulatory Visit: Payer: Self-pay

## 2019-01-17 ENCOUNTER — Encounter: Payer: Self-pay | Admitting: Osteopathic Medicine

## 2019-01-17 ENCOUNTER — Telehealth: Payer: Self-pay | Admitting: Physician Assistant

## 2019-01-17 DIAGNOSIS — M6281 Muscle weakness (generalized): Secondary | ICD-10-CM | POA: Diagnosis not present

## 2019-01-17 DIAGNOSIS — M25512 Pain in left shoulder: Secondary | ICD-10-CM

## 2019-01-17 DIAGNOSIS — R29898 Other symptoms and signs involving the musculoskeletal system: Secondary | ICD-10-CM

## 2019-01-17 DIAGNOSIS — R293 Abnormal posture: Secondary | ICD-10-CM

## 2019-01-17 NOTE — Telephone Encounter (Signed)
Patient stopped by the office around lunch time today and had a question about her insulin. She wanted to make sure that she was taking the correct amount of Insulin. I looked in the last note and per PCP recommendations:   Continue Metformin as you are taking it  Measure fasting blood sugar every day: goal for now is to get this to 120-150  Increase daily Insulin dose by 2 units at a time, every 3 days, until fasting sugars are consistently 120-150, then continue at that dose  Plan to recheck A1C in 3 months ? Plan to follow-up in the office sooner if you experience low sugars  ? Plan to follow-up in the office sooner if your fasting sugars are consistently high I went over instructions and she voices understanding. She was advised to keep a record of her sugar readings. Patient was instructed to keep a log of her blood sugars and she will send this as a Pharmacist, community message. Patient did not have any additional questions.  Patient was advised to go to Urgent Care or the ED over the weekend if her blood sugars became worse.

## 2019-01-17 NOTE — Therapy (Signed)
[place], [place], [address] Phone: [phone]   Fax:  [phone]  Physical Therapy Treatment  Patient Details  Name: Kelli [name] MRN: [id number] Date of Birth: 1945/10/26 Referring Provider (PT): Dr [name]    Encounter Date: 01/17/2019  PT End of Session - 01/17/19 1207    Visit Number  19    Number of Visits  24    Date for PT Re-Evaluation  01/21/19    PT Start Time  1104    PT Stop Time  1144    PT Time Calculation (min)  40 min    Activity Tolerance  Patient tolerated treatment well    Behavior During Therapy  [facility] for tasks assessed/performed       Past Medical History:  Diagnosis Date  . Anxiety   . Arthritis   . Borderline diabetic   . Colon cancer ([place])    colon ca dx 02/2009, lung cancer  . Depression   . Depression with anxiety 09/19/2011  . H/O colon cancer, stage II 09/19/2011   T3N0  Cecum Sept 2010  . High cholesterol   . Hyperlipidemia 09/19/2011  . Lung nodules 09/19/2011   granulomas  . Urinary urgency     Past Surgical History:  Procedure Laterality Date  . APPENDECTOMY    . BREAST SURGERY  1978   augmentation  . COLON SURGERY     2010  . CRYO INTERCOSTAL NERVE BLOCK N/A 10/05/2014   Procedure: CRYO INTERCOSTAL NERVE BLOCK;  Surgeon: [name], MD;  Location: [place];  Service: Thoracic;  Laterality: N/A;  . LYMPH NODE DISSECTION Right 10/05/2014   Procedure: LYMPH NODE DISSECTION;  Surgeon: [name], MD;  Location: [place];  Service: Thoracic;  Laterality: Right;  . ORIF HUMERUS FRACTURE Left 10/01/2018   Procedure: OPEN REDUCTION INTERNAL FIXATION (ORIF) LEFT PROXIMAL HUMERUS FRACTURE;  Surgeon: [name], MD;  Location: [place];  Service: Orthopedics;  Laterality: Left;  . PARTIAL COLECTOMY Right 2010  . SEGMENTECOMY Right 10/05/2014   Procedure: right lower lobe SEGMENTECTOMY;  Surgeon: [name], MD;  Location:  [place];  Service: Thoracic;  Laterality: Right;  [name] VIDEO ASSISTED THORACOSCOPY (VATS)/WEDGE RESECTION Right 10/05/2014   Procedure: Right VIDEO ASSISTED THORACOSCOPY (VATS) with right lower lobe lung nodule WEDGE RESECTION;  Surgeon: [name], MD;  Location: [place];  Service: Thoracic;  Laterality: Right;    There were no vitals filed for this visit.  Subjective Assessment - 01/17/19 1110    Subjective  Pt reports she the DN has helped.  She can now turn the steering wheel with Lt hand without pain. She is happy with her progress so far.    Currently in Pain?  No/denies    Pain Score  0-No pain         [facility] PT Assessment - 01/17/19 0001      Assessment   Medical Diagnosis  Lt proximal humeral fracture     Referring Provider (PT)  Dr [name]     Onset Date/Surgical Date  10/01/18    Hand Dominance  Right    Prior Therapy  here for Rt shoulder       AROM   Left Shoulder Extension  42 Degrees    Left Shoulder Flexion  82 Degrees    Left Shoulder ABduction  70 Degrees    Left Shoulder Internal Rotation  --   palm to  Lt buttocks      [facility] Adult PT Treatment/Exercise - 01/17/19 0001      Shoulder Exercises: Supine   External Rotation  AAROM;10 reps   cane   Flexion  AAROM;Both;10 reps   cane with Rt UE      Shoulder Exercises: Standing   Internal Rotation  AAROM;Left;5 reps   assisting w/ cane then clasping hands bringing hands up back   Flexion Limitations  Lt shoulder flex to shoulder height, holding 1# wt to shelf, then eccentric lowering to counter x 6 reps; Lt shoulder flexion AAROM to overhead shelf and eccentric lowering x 5 reps     Extension  AAROM;Both;10 reps   cane   Other Standing Exercises  Lt shoulder flexion x 3 reps, up to #25.       Shoulder Exercises: Pulleys   Flexion  --   10 sec hold x 12 reps    Scaption  --   10 sec hold x 12 reps      Shoulder Exercises: ROM/Strengthening   UBE (Upper Arm Bike)  L1: 60 sec each direction       Manual Therapy   Manual therapy comments  pt supine     Soft tissue mobilization  STM to posterior shoulder (Lt), pec, upper trap, and bicep     Myofascial Release  Lt pec     Scapular Mobilization  Lt scapula in all planes    Passive ROM  Lt shoulder flexion; scaption, ER in scaption in supine, horiz abd, ext- to tissue limits and tolerance               PT Short Term Goals - 12/10/18 1158      PT SHORT TERM GOAL #1   Title  Instruct patient in improved posture and alignment with pt to demonstrate activation of posterior shoulder girdle musculature 12/10/2018    Time  6    Period  Weeks    Status  Partially Met      PT SHORT TERM GOAL #2   Title  Independent in initial HEP 12/10/2018    Baseline  encouraged compliance    Time  6    Period  Weeks    Status  Partially Met      PT SHORT TERM GOAL #3   Title  Instruct patient in appropriate exercises for home pool 12/10/2018    Time  6    Period  Weeks    Status  Achieved        PT Long Term Goals - 12/20/18 1439      PT LONG TERM GOAL #1   Title  Improve posture and alignment with patient to demonstrate improved upright posture with posterior shoulder girdle engaged 01/21/2019    Time  12    Period  Weeks    Status  On-going      PT LONG TERM GOAL #2   Title  Increase AROM Lt shoulder to ~ 120 degrees shoulder elevation and 50-60 degrees ER and hand to sacrum IR 01/21/2019    Baseline  pt able to get hand to sacrum; continued limitation with flexion/ ER    Time  12    Period  Weeks    Status  Partially Met      PT LONG TERM GOAL #3   Title  Patient reports using Lt UE for functional activities including washing face; turning on/off light switch; etc 01/21/2019    Baseline  improved functional use reported  Time  12    Period  Weeks    Status  Partially Met      PT LONG TERM GOAL #4   Title  Independent in HEP 01/21/2019    Time  12    Period  Weeks    Status  On-going      PT LONG TERM GOAL #5   Title   Improve FOTO =/< 42% limited by 01/21/2019    Time  12    Period  Weeks    Status  On-going            Plan - 01/17/19 1208    Clinical Impression Statement  Pt more stiff today; ROM in Lt shoulder remains limited. Pt tolerted manual therapy/ PROM with greater ease, less sensitivity that in past.  Pt reporting improved functional mobility at home.  Pt progressing gradually towards remaining goals.    Rehab Potential  Good    PT Frequency  2x / week    PT Duration  12 weeks    PT Treatment/Interventions  Patient/family education;ADLs/Self Care Home Management;Cryotherapy;Moist Heat;Iontophoresis 41m/ml Dexamethasone;Therapeutic activities;Therapeutic exercise;Neuromuscular re-education;Manual techniques;Dry needling    PT Next Visit Plan  Continue working on disassociation of scapula from shoulder; continue with DN as indicated. 20th visit note; end of POC.    PT Home Exercise Plan  Access Code: [email or web address]   ( patient has a pool at her home )    Consulted and Agree with Plan of Care  Patient       Patient will benefit from skilled therapeutic intervention in order to improve the following deficits and impairments:  Postural dysfunction, Improper body mechanics, Pain, Increased fascial restricitons, Increased muscle spasms, Hypomobility, Decreased strength, Decreased mobility, Decreased range of motion, Decreased activity tolerance, Impaired UE functional use  Visit Diagnosis: 1. Acute pain of left shoulder   2. Other symptoms and signs involving the musculoskeletal system   3. Abnormal posture   4. Muscle weakness (generalized)        Problem List Patient Active Problem List   Diagnosis Date Noted  . Closed fracture of left proximal humerus 09/26/2018  . Squamous cell carcinoma 03/05/2017  . Echocardiogram shows left ventricular diastolic dysfunction 013/64/3837 . Heart palpitations 07/25/2016  . Need for vaccination for pneumococcus 07/13/2016  . Osteoarthritis of hand,  right 08/06/2015  . Diabetes mellitus type 2, uncomplicated ([facility] 079/39/6886 . Lung cancer, lower lobe ([facility] 10/09/2014  . Hematochezia 04/01/2013  . H/O colon cancer, stage II 09/19/2011  . Depression with anxiety 09/19/2011  . Hyperlipidemia 09/19/2011   JKerin Perna PTA 01/17/19 12:19 PM  [place] [address]Phone: [phone]  Fax:  [phone] Name: SANNELY SLIVAMRN: [id number] of Birth: 827-Jul-1947

## 2019-01-20 ENCOUNTER — Encounter: Payer: Self-pay | Admitting: Rehabilitative and Restorative Service Providers"

## 2019-01-21 ENCOUNTER — Encounter: Payer: Medicare HMO | Admitting: Rehabilitative and Restorative Service Providers"

## 2019-01-22 ENCOUNTER — Ambulatory Visit: Payer: Medicare HMO

## 2019-01-24 ENCOUNTER — Encounter: Payer: Self-pay | Admitting: Physical Therapy

## 2019-01-24 ENCOUNTER — Ambulatory Visit: Payer: Medicare HMO | Admitting: Physical Therapy

## 2019-01-24 ENCOUNTER — Other Ambulatory Visit: Payer: Self-pay

## 2019-01-24 DIAGNOSIS — R293 Abnormal posture: Secondary | ICD-10-CM

## 2019-01-24 DIAGNOSIS — M6281 Muscle weakness (generalized): Secondary | ICD-10-CM | POA: Diagnosis not present

## 2019-01-24 DIAGNOSIS — M25512 Pain in left shoulder: Secondary | ICD-10-CM | POA: Diagnosis not present

## 2019-01-24 DIAGNOSIS — R29898 Other symptoms and signs involving the musculoskeletal system: Secondary | ICD-10-CM

## 2019-01-24 NOTE — Therapy (Addendum)
[facility] [place] [address] [place], [place], [address] Phone: [phone]   Fax:  [phone]  Physical Therapy Treatment  Progress Note Reporting Period from 12/10/2018 to 01/24/2019  See note below for Objective Data and Assessment of Progress/Goals.   Gradually progressing toward goals of therapy including increased level of independence and function.    Patient Details  Name: Kelli [name] MRN: [id number] Date of Birth: December 18, 1945 Referring Provider (PT): Dr [name]    Encounter Date: 01/24/2019  PT End of Session - 01/24/19 1604    Visit Number  20    Number of Visits  24    Date for PT Re-Evaluation  01/21/19    PT Start Time  4970    PT Stop Time  2637    PT Time Calculation (min)  42 min       Past Medical History:  Diagnosis Date  . Anxiety   . Arthritis   . Borderline diabetic   . Colon cancer ([place])    colon ca dx 02/2009, lung cancer  . Depression   . Depression with anxiety 09/19/2011  . H/O colon cancer, stage II 09/19/2011   T3N0  Cecum Sept 2010  . High cholesterol   . Hyperlipidemia 09/19/2011  . Lung nodules 09/19/2011   granulomas  . Urinary urgency     Past Surgical History:  Procedure Laterality Date  . APPENDECTOMY    . BREAST SURGERY  1978   augmentation  . COLON SURGERY     2010  . CRYO INTERCOSTAL NERVE BLOCK N/A 10/05/2014   Procedure: CRYO INTERCOSTAL NERVE BLOCK;  Surgeon: [name], MD;  Location: [facility];  Service: Thoracic;  Laterality: N/A;  . LYMPH NODE DISSECTION Right 10/05/2014   Procedure: LYMPH NODE DISSECTION;  Surgeon: [name], MD;  Location: [facility];  Service: Thoracic;  Laterality: Right;  . ORIF HUMERUS FRACTURE Left 10/01/2018   Procedure: OPEN REDUCTION INTERNAL FIXATION (ORIF) LEFT PROXIMAL HUMERUS FRACTURE;  Surgeon: [name], MD;  Location: [facility];  Service: Orthopedics;  Laterality: Left;  . PARTIAL COLECTOMY Right 2010   . SEGMENTECOMY Right 10/05/2014   Procedure: right lower lobe SEGMENTECTOMY;  Surgeon: [name], MD;  Location: [facility];  Service: Thoracic;  Laterality: Right;  [name] VIDEO ASSISTED THORACOSCOPY (VATS)/WEDGE RESECTION Right 10/05/2014   Procedure: Right VIDEO ASSISTED THORACOSCOPY (VATS) with right lower lobe lung nodule WEDGE RESECTION;  Surgeon: [name], MD;  Location: [facility];  Service: Thoracic;  Laterality: Right;    There were no vitals filed for this visit.  Subjective Assessment - 01/24/19 1605    Subjective  Pt reports her Lt shoulder has been burning more with certain motions (up to 5/10).  She hasn't gotten in pool in last 2 days due to rain and hasn't done as much exercise at home as she probably should.  She reports she doesn't feel like she is making as much gain as she has in the past.    Patient Stated Goals  use arm Lt UE and get it moving again     Currently in Pain?  No/denies    Pain Score  0-No pain         [facility] PT Assessment - 01/24/19 0001      Assessment   Medical Diagnosis  Lt proximal humeral fracture     Referring Provider (PT)  Dr [name]     Onset Date/Surgical Date  10/01/18    Hand Dominance  Right    Prior Therapy  here for Rt shoulder       Observation/Other Assessments   Focus on Therapeutic Outcomes (FOTO)   48% limitation, 42% goal       PROM   Right/Left Shoulder  Left    Left Shoulder Flexion  120 Degrees      [facility] Adult PT Treatment/Exercise - 01/24/19 0001      Shoulder Exercises: Supine   Horizontal ABduction  Strengthening;Both;10 reps;Theraband    Theraband Level (Shoulder Horizontal ABduction)  Level 2 (Red)    External Rotation  Strengthening;Both;10 reps    Theraband Level (Shoulder External Rotation)  Level 2 (Red)    Flexion  Strengthening;Both;10 reps;Theraband;AAROM;5 reps   cane   Diagonals  Left;5 reps;AAROM   cane   Diagonals Limitations  trial of Lt shoulder D2 flexion pattern with yelllow  band and assist from PTA x 5 reps       Shoulder Exercises: Standing   Extension  Strengthening;Both;10 reps    Theraband Level (Shoulder Extension)  Level 2 (Red)    Row  Strengthening;Both    Theraband Level (Shoulder Row)  Level 2 (Red)    Retraction  Strengthening;Both;10 reps    Theraband Level (Shoulder Retraction)  Level 2 (Red)      Shoulder Exercises: Pulleys   Flexion  --   10 sec hold x 10 reps    Scaption  --   10 sec hold x 10 reps      Shoulder Exercises: Stretch   Internal Rotation Stretch  1 rep   10 sec   Table Stretch - Flexion  --   10 reps, 5 sec hold   Table Stretch - External Rotation  3 reps;10 seconds    Other Shoulder Stretches  low level doorway stretch with both arms on door frame, x20 sec x 2 reps     Other Shoulder Stretches  Lt shoulder ext stretch holding counter x 20 sec x 4 reps (tactile cues to correct form)      Manual Therapy   Manual therapy comments  pt seated with Lt arm supported.      Soft tissue mobilization  IASTM and STM to posterior/ant shoulder (Lt), pec, upper trap,tricep,  bicep - to decrease fascial restrictions and improve ROM               PT Short Term Goals - 01/24/19 1614      PT SHORT TERM GOAL #1   Title  Instruct patient in improved posture and alignment with pt to demonstrate activation of posterior shoulder girdle musculature 12/10/2018    Time  6    Period  Weeks    Status  Partially Met      PT SHORT TERM GOAL #2   Title  Independent in initial HEP 12/10/2018    Baseline  encouraged compliance    Time  6    Period  Weeks    Status  Achieved      PT SHORT TERM GOAL #3   Title  Instruct patient in appropriate exercises for home pool 12/10/2018    Time  6    Period  Weeks    Status  Achieved        PT Long Term Goals - 01/24/19 1615      PT LONG TERM GOAL #1   Title  Improve posture and alignment with patient to demonstrate improved upright posture with posterior shoulder girdle  engaged 01/21/2019     Time  12    Period  Weeks    Status  On-going      PT LONG TERM GOAL #2   Title  Increase AROM Lt shoulder to ~ 120 degrees shoulder elevation and 50-60 degrees ER and hand to sacrum IR 01/21/2019    Baseline  pt able to get hand to sacrum; continued limitation with flexion/ ER    Time  12    Period  Weeks    Status  Partially Met      PT LONG TERM GOAL #3   Title  Patient reports using Lt UE for functional activities including washing face; turning on/off light switch; etc 01/21/2019    Baseline  improved functional use reported    Time  12    Period  Weeks    Status  Partially Met      PT LONG TERM GOAL #4   Title  Independent in HEP 01/21/2019    Time  12    Period  Weeks    Status  On-going      PT LONG TERM GOAL #5   Title  Improve FOTO =/< 42% limited by 01/21/2019    Time  12    Period  Weeks    Status  On-going        Recert sent     Plan - 01/24/19 1623    Clinical Impression Statement  Pt continues with limited Lt shoulder ROM, making gradual gains over the course of treatment.  Pt tolerated increased resistance with strengthening exercises as well as current AAROM exercises.  Encouraged pt to be compliant with exercises even when she can't get into her pool so that she continue making functional gains in Lt shoulder; pt agreeable.  Pt has partially met her goals and will benefit from continued PT intervention to maximize rehab potential and functional mobility. Additional visits are reasonable and necessary considering the severity of injury and current progress.    Rehab Potential  Good    PT Frequency  2x / week    PT Duration  12 weeks    PT Treatment/Interventions  Patient/family education;ADLs/Self Care Home Management;Cryotherapy;Moist Heat;Iontophoresis 13m/ml Dexamethasone;Therapeutic activities;Therapeutic exercise;Neuromuscular re-education;Manual techniques;Dry needling    PT Next Visit Plan  Continue working on disassociation of scapula from shoulder,  ROM, functional strengthening; continue with DN as indicated.    PT Home Exercise Plan  Access Code: [email or web address]   ( patient has a pool at her home )    Recommended Other Services  NO MODALITIES other than cold/heat due to history of cancer.    Consulted and Agree with Plan of Care  Patient       Patient will benefit from skilled therapeutic intervention in order to improve the following deficits and impairments:  Postural dysfunction, Improper body mechanics, Pain, Increased fascial restricitons, Increased muscle spasms, Hypomobility, Decreased strength, Decreased mobility, Decreased range of motion, Decreased activity tolerance, Impaired UE functional use  Visit Diagnosis: Acute pain of left shoulder  Other symptoms and signs involving the musculoskeletal system  Abnormal posture  Muscle weakness (generalized)     Problem List Patient Active Problem List   Diagnosis Date Noted  . Closed fracture of left proximal humerus 09/26/2018  . Squamous cell carcinoma 03/05/2017  . Echocardiogram shows left ventricular diastolic dysfunction 081/19/1478 . Heart palpitations 07/25/2016  . Need for vaccination for pneumococcus 07/13/2016  . Osteoarthritis of hand, right 08/06/2015  . Diabetes mellitus type  2, uncomplicated ([facility]) 95/39/6728  . Lung cancer, lower lobe ([facility]) 10/09/2014  . Hematochezia 04/01/2013  . H/O colon cancer, stage II 09/19/2011  . Depression with anxiety 09/19/2011  . Hyperlipidemia 09/19/2011   Kelli [name], PTA 01/24/19 4:26 PM  Kelli [name] PT, MPH 01/24/19 5:25 PM    [facility] [place], [place], [address] Phone: [phone]   Fax:  [phone]  Name: MUREL WIGLE MRN: [id number] Date of Birth: 1946/01/07

## 2019-01-27 ENCOUNTER — Ambulatory Visit (INDEPENDENT_AMBULATORY_CARE_PROVIDER_SITE_OTHER): Payer: Medicare HMO | Admitting: Osteopathic Medicine

## 2019-01-27 ENCOUNTER — Encounter: Payer: Self-pay | Admitting: Osteopathic Medicine

## 2019-01-27 DIAGNOSIS — E119 Type 2 diabetes mellitus without complications: Secondary | ICD-10-CM

## 2019-01-27 NOTE — Progress Notes (Signed)
Virtual Visit via Video (App used: Doximity) Note  I connected with      Kelli [name] on 01/27/19 at 11:00 AM  by a telemedicine application and verified that I am speaking with the correct person using two identifiers.  Patient is at home I am in office    I discussed the limitations of evaluation and management by telemedicine and the availability of in person appointments. The patient expressed understanding and agreed to proceed.  History of Present Illness: Kelli [name] is a 73 y.o. female who would like to discuss follow-up sugars    12 units insulin daily. Glc: Yesterday 138, today 108 after yesterday eating a pretty low-carb diet.  No hypoglycemic episodes.  Wants to know if she can maybe stop the metformin, just 1 less pill to take,      Observations/Objective: LMP  (LMP Unknown)  BP Readings from Last 3 Encounters:  12/11/18 135/67  11/26/18 109/64  10/01/18 (!) 120/59   Exam: Normal Speech.  NAD  Lab and Radiology Results No results found for this or any previous visit (from the past 72 hour(s)). No results found.     Assessment and Plan: 73 y.o. female with The encounter diagnosis was Type 2 diabetes mellitus without complication, without long-term current use of insulin ([place]).  I advised patient okay to come off of the metformin.  If blood sugars continue to be above 130, can increase insulin by 2 units at a time until blood sugars are consistently 100-130.  PDMP not reviewed this encounter. No orders of the defined types were placed in this encounter.  No orders of the defined types were placed in this encounter.  Patient Instructions  Plan: Target fasting blood glucose 100-130.  If sugars are consistently higher than 130, can increase insulin by 2 units and see how the fasting sugars respond.  I imagine they might go up a bit with stopping the metformin, and if you are not careful about low-carb diet.  These let me know if any sugars are  dropping.  Otherwise, let us plan to follow-up in the office around early October to repeat the A1c!    Instructions sent via MyChart. If MyChart not available, pt was given option for info via personal e-mail w/ no guarantee of protected health info over unsecured e-mail communication, and MyChart sign-up instructions were included.   Follow Up Instructions: Return for A1c recheck around 03/13/2019.    I discussed the assessment and treatment plan with the patient. The patient was provided an opportunity to ask questions and all were answered. The patient agreed with the plan and demonstrated an understanding of the instructions.   The patient was advised to call back or seek an in-person evaluation if any new concerns, if symptoms worsen or if the condition fails to improve as anticipated.  25 minutes of non-face-to-face time was provided during this encounter.                      Historical information moved to improve visibility of documentation.  Past Medical History:  Diagnosis Date  . Anxiety   . Arthritis   . Borderline diabetic   . Colon cancer ([facility])    colon ca dx 02/2009, lung cancer  . Depression   . Depression with anxiety 09/19/2011  . H/O colon cancer, stage II 09/19/2011   T3N0  Cecum Sept 2010  . High cholesterol   . Hyperlipidemia 09/19/2011  . Lung nodules  09/19/2011   granulomas  . Urinary urgency    Past Surgical History:  Procedure Laterality Date  . APPENDECTOMY    . BREAST SURGERY  1978   augmentation  . COLON SURGERY     2010  . CRYO INTERCOSTAL NERVE BLOCK N/A 10/05/2014   Procedure: CRYO INTERCOSTAL NERVE BLOCK;  Surgeon: [name], MD;  Location: [facility];  Service: Thoracic;  Laterality: N/A;  . LYMPH NODE DISSECTION Right 10/05/2014   Procedure: LYMPH NODE DISSECTION;  Surgeon: [name], MD;  Location: [facility];  Service: Thoracic;  Laterality: Right;  . ORIF HUMERUS FRACTURE Left 10/01/2018   Procedure: OPEN  REDUCTION INTERNAL FIXATION (ORIF) LEFT PROXIMAL HUMERUS FRACTURE;  Surgeon: [name], MD;  Location: [facility];  Service: Orthopedics;  Laterality: Left;  . PARTIAL COLECTOMY Right 2010  . SEGMENTECOMY Right 10/05/2014   Procedure: right lower lobe SEGMENTECTOMY;  Surgeon: [name], MD;  Location: [facility];  Service: Thoracic;  Laterality: Right;  [name] VIDEO ASSISTED THORACOSCOPY (VATS)/WEDGE RESECTION Right 10/05/2014   Procedure: Right VIDEO ASSISTED THORACOSCOPY (VATS) with right lower lobe lung nodule WEDGE RESECTION;  Surgeon: [name], MD;  Location: [facility];  Service: Thoracic;  Laterality: Right;   Social History   Tobacco Use  . Smoking status: Former Smoker    Packs/day: 3.00    Years: 40.00    Pack years: 120.00    Types: Cigarettes    Quit date: 02/06/2009    Years since quitting: 9.9  . Smokeless tobacco: Never Used  Substance Use Topics  . Alcohol use: Yes    Alcohol/week: 0.0 standard drinks    Comment: 3-4 times a week 1-2 beers   family history includes Heart disease in her brother and mother.  Medications: Current Outpatient Medications  Medication Sig Dispense Refill  . AMBULATORY NON FORMULARY MEDICATION Single glucometer with lancets, test strips. Test daily.  Disp qs 3 months E11.9 1 each 0  . aspirin 81 MG tablet Take 81 mg by mouth daily.    . B Complex-C (B-COMPLEX WITH VITAMIN C) tablet Take 1 tablet by mouth daily.    . Cholecalciferol (VITAMIN D) 2000 UNITS tablet Take 2,000 Units by mouth daily.    . cloNIDine (CATAPRES) 0.1 MG tablet Take 1 tablet (0.1 mg total) by mouth at bedtime. 90 tablet 3  . FLUoxetine (PROZAC) 40 MG capsule Take 1 capsule (40 mg total) by mouth daily. (Patient taking differently: Take 40 mg by mouth daily with lunch. ) 90 capsule 3  . gabapentin (NEURONTIN) 300 MG capsule Take 1 capsule (300 mg total) by mouth at bedtime as needed (insomnia). 90 capsule 3  . HYDROcodone-acetaminophen (NORCO/VICODIN) 5-325 MG  tablet 1 po q 8-12 hrs prn pain 30 tablet 0  . Insulin Glargine (BASAGLAR KWIKPEN) 100 UNIT/ML SOPN Inject 0.1 mLs (10 Units total) into the skin at bedtime. Increase as directed: increase by 2 units every 3 days t fasting blood sugar goal 120-150. Max dose 60 units 5 pen 11  . ONETOUCH VERIO test strip TEST BLOOD SUGARS DAILY 100 strip 0  . pravastatin (PRAVACHOL) 80 MG tablet Take 1 tablet (80 mg total) by mouth daily. (Patient taking differently: Take 80 mg by mouth daily with lunch. ) 90 tablet 3  . metFORMIN (GLUCOPHAGE-XR) 500 MG 24 hr tablet Take 2 tablets (1,000 mg total) by mouth daily with breakfast. (Patient taking differently: Take 1,000 mg by mouth daily with lunch. ) 180 tablet 3  No current facility-administered medications for this visit.    Allergies  Allergen Reactions  . Atorvastatin Other (See Comments)    Muscle cramps  . Glipizide Er Other (See Comments)    Shaking / tingling in fingers     PDMP not reviewed this encounter. No orders of the defined types were placed in this encounter.  No orders of the defined types were placed in this encounter.

## 2019-01-27 NOTE — Patient Instructions (Signed)
Plan: Target fasting blood glucose 100-130.  If sugars are consistently higher than 130, can increase insulin by 2 units and see how the fasting sugars respond.  I imagine they might go up a bit with stopping the metformin, and if you are not careful about low-carb diet.  These let me know if any sugars are dropping.  Otherwise, let us plan to follow-up in the office around early October to repeat the A1c!

## 2019-01-28 ENCOUNTER — Ambulatory Visit: Payer: Medicare HMO | Admitting: Physical Therapy

## 2019-01-28 ENCOUNTER — Other Ambulatory Visit: Payer: Self-pay

## 2019-01-28 DIAGNOSIS — M6281 Muscle weakness (generalized): Secondary | ICD-10-CM

## 2019-01-28 DIAGNOSIS — M25512 Pain in left shoulder: Secondary | ICD-10-CM | POA: Diagnosis not present

## 2019-01-28 DIAGNOSIS — R293 Abnormal posture: Secondary | ICD-10-CM

## 2019-01-28 DIAGNOSIS — R29898 Other symptoms and signs involving the musculoskeletal system: Secondary | ICD-10-CM

## 2019-01-28 NOTE — Telephone Encounter (Signed)
Appointment has been made. No further questions at this time.  

## 2019-01-28 NOTE — Therapy (Signed)
[facility] [address] [place], [place], [address] Phone: [phone]   Fax:  [phone]  Physical Therapy Treatment  Patient Details  Name: Kelli [name] MRN: [id number] Date of Birth: 10/01/1945 Referring Provider (PT): Dr Gregory Scott Dean    Encounter Date: 01/28/2019  PT End of Session - 01/28/19 1155    Visit Number  21    Number of Visits  24    Authorization Time Period  10/29/18-01/29/19    PT Start Time  1104    PT Stop Time  1144    PT Time Calculation (min)  40 min    Activity Tolerance  Patient tolerated treatment well    Behavior During Therapy  WFL for tasks assessed/performed       Past Medical History:  Diagnosis Date  . Anxiety   . Arthritis   . Borderline diabetic   . Colon cancer (HCC)    colon ca dx 02/2009, lung cancer  . Depression   . Depression with anxiety 09/19/2011  . H/O colon cancer, stage II 09/19/2011   T3N0  Cecum Sept 2010  . High cholesterol   . Hyperlipidemia 09/19/2011  . Lung nodules 09/19/2011   granulomas  . Urinary urgency     Past Surgical History:  Procedure Laterality Date  . APPENDECTOMY    . BREAST SURGERY  1978   augmentation  . COLON SURGERY     2010  . CRYO INTERCOSTAL NERVE BLOCK N/A 10/05/2014   Procedure: CRYO INTERCOSTAL NERVE BLOCK;  Surgeon: Steven C Hendrickson, MD;  Location: [facility];  Service: Thoracic;  Laterality: N/A;  . LYMPH NODE DISSECTION Right 10/05/2014   Procedure: LYMPH NODE DISSECTION;  Surgeon: Steven C Hendrickson, MD;  Location: [facility];  Service: Thoracic;  Laterality: Right;  . ORIF HUMERUS FRACTURE Left 10/01/2018   Procedure: OPEN REDUCTION INTERNAL FIXATION (ORIF) LEFT PROXIMAL HUMERUS FRACTURE;  Surgeon: Dean, Gregory Scott, MD;  Location: [facility];  Service: Orthopedics;  Laterality: Left;  . PARTIAL COLECTOMY Right 2010  . SEGMENTECOMY Right 10/05/2014   Procedure: right lower lobe SEGMENTECTOMY;  Surgeon: Steven C Hendrickson, MD;   Location: [facility];  Service: Thoracic;  Laterality: Right;  . VIDEO ASSISTED THORACOSCOPY (VATS)/WEDGE RESECTION Right 10/05/2014   Procedure: Right VIDEO ASSISTED THORACOSCOPY (VATS) with right lower lobe lung nodule WEDGE RESECTION;  Surgeon: Steven C Hendrickson, MD;  Location: [facility];  Service: Thoracic;  Laterality: Right;    There were no vitals filed for this visit.  Subjective Assessment - 01/28/19 1226    Subjective  "I did too much yesterday, my shoulder got sore".    Patient Stated Goals  use arm Lt UE and get it moving again     Currently in Pain?  No/denies   up to 5/10 burning with certain motions   Pain Score  0-No pain         [facility] PT Assessment - 01/28/19 0001      PROM   Left Shoulder External Rotation  30 Degrees   arm in supported scaption, AAROM with cane      [facility] Adult PT Treatment/Exercise - 01/28/19 0001      Shoulder Exercises: Supine   Flexion  AAROM;Both;10 reps   10 sec hold   Other Supine Exercises  hand to forehead x 2 reps       Shoulder Exercises: Standing   Other Standing Exercises  Lt arm flex with IR/ER moving rings over arch (shoulder to 90+   deg of flexion) with cues to avoid compensatory strategies with scapula hiking.  x 5 reps each direction, 2 sets    Other Standing Exercises  pendulum with Lt arm       Shoulder Exercises: Stretch   Internal Rotation Stretch  4 reps   10 sec   Internal Rotation Stretch Limitations  Rt hand assisting Lt hand above beltline     Wall Stretch - Flexion  1 rep;20 seconds    Table Stretch - Flexion  5 reps   10 sec hold, hands on railing walking back   Other Shoulder Stretches  low level doorway stretch with both arms on door frame, x20 sec x 2 reps     Other Shoulder Stretches  Lt shoulder ext stretch holding counter x 20 sec x 4 reps (tactile cues to correct form); Lt shoulder horiz abdct holding door frame x 20 sec       Manual Therapy   Soft tissue mobilization  STM to pec, lat, subscap, upper trap,  levator, rhomboid     Scapular Mobilization  Lt scapula in all planes    Passive ROM  Lt shoulder flexion, scaption, ER (with arm supported in scaption), ext, horiz abdct - to tissue limits and no pain.                PT Short Term Goals - 01/24/19 1614      PT SHORT TERM GOAL #1   Title  Instruct patient in improved posture and alignment with pt to demonstrate activation of posterior shoulder girdle musculature 12/10/2018    Time  6    Period  Weeks    Status  Partially Met      PT SHORT TERM GOAL #2   Title  Independent in initial HEP 12/10/2018    Baseline  encouraged compliance    Time  6    Period  Weeks    Status  Achieved      PT SHORT TERM GOAL #3   Title  Instruct patient in appropriate exercises for home pool 12/10/2018    Time  6    Period  Weeks    Status  Achieved        PT Long Term Goals - 01/24/19 1615      PT LONG TERM GOAL #1   Title  Improve posture and alignment with patient to demonstrate improved upright posture with posterior shoulder girdle engaged 01/21/2019    Time  12    Period  Weeks    Status  On-going      PT LONG TERM GOAL #2   Title  Increase AROM Lt shoulder to ~ 120 degrees shoulder elevation and 50-60 degrees ER and hand to sacrum IR 01/21/2019    Baseline  pt able to get hand to sacrum; continued limitation with flexion/ ER    Time  12    Period  Weeks    Status  Partially Met      PT LONG TERM GOAL #3   Title  Patient reports using Lt UE for functional activities including washing face; turning on/off light switch; etc 01/21/2019    Baseline  improved functional use reported    Time  12    Period  Weeks    Status  Partially Met      PT LONG TERM GOAL #4   Title  Independent in HEP 01/21/2019    Time  12    Period  Weeks    Status    On-going      PT LONG TERM GOAL #5   Title  Improve FOTO =/< 42% limited by 01/21/2019    Time  12    Period  Weeks    Status  On-going            Plan - 01/28/19 1227    Clinical  Impression Statement  Pt's Lt shoulder PROM gradually improving; ER now up to 30 deg.  Pt initially stiff at beginning of session.  Pt much less guarded with manual therapy than she initially was.  She continues with some compensatory scapular motions with use of LUE; given cues throughout session to avoid this.  Pt progressing gradually towards remaining goals.    Rehab Potential  Good    PT Frequency  2x / week    PT Duration  12 weeks    PT Treatment/Interventions  Patient/family education;ADLs/Self Care Home Management;Cryotherapy;Moist Heat;Iontophoresis 4mg/ml Dexamethasone;Therapeutic activities;Therapeutic exercise;Neuromuscular re-education;Manual techniques;Dry needling    PT Next Visit Plan  Continue working on disassociation of scapula from shoulder; continue with DN as indicated.    PT Home Exercise Plan  Access Code: [email or web address]    ( patient has a pool at her home )    Consulted and Agree with Plan of Care  Patient       Patient will benefit from skilled therapeutic intervention in order to improve the following deficits and impairments:  Postural dysfunction, Improper body mechanics, Pain, Increased fascial restricitons, Increased muscle spasms, Hypomobility, Decreased strength, Decreased mobility, Decreased range of motion, Decreased activity tolerance, Impaired UE functional use  Visit Diagnosis: Acute pain of left shoulder  Other symptoms and signs involving the musculoskeletal system  Abnormal posture  Muscle weakness (generalized)     Problem List Patient Active Problem List   Diagnosis Date Noted  . Closed fracture of left proximal humerus 09/26/2018  . Squamous cell carcinoma 03/05/2017  . Echocardiogram shows left ventricular diastolic dysfunction 08/21/2016  . Heart palpitations 07/25/2016  . Need for vaccination for pneumococcus 07/13/2016  . Osteoarthritis of hand, right 08/06/2015  . Diabetes mellitus type 2, uncomplicated (HCC) 12/31/2014  . Lung cancer,  lower lobe (HCC) 10/09/2014  . Hematochezia 04/01/2013  . H/O colon cancer, stage II 09/19/2011  . Depression with anxiety 09/19/2011  . Hyperlipidemia 09/19/2011    Carlson-Long, PTA 01/28/19 12:31 PM  [facility] [address] [place], [place], [address] Phone: [phone]   Fax:  [phone]  Name: Kelli [name] MRN: [id number] Date of Birth: 09/16/1945   

## 2019-01-28 NOTE — Telephone Encounter (Signed)
-----   Message from Emeterio Reeve, DO sent at 01/27/2019  5:58 PM EDT ----- Due to follow-up recheck A1c in office in early October, around 03/13/2019

## 2019-01-29 NOTE — Addendum Note (Signed)
Addended by: Everardo All on: 01/29/2019 10:51 AM   Modules accepted: Orders

## 2019-01-29 NOTE — Telephone Encounter (Signed)
To triage! Can let patient know - Couldn't say for sure without evaluation. For really severe headaches (especially if headaches are not common for you) I'd recommend going to the emergency room, the life-threatening thing to rule out is an aneurysm!

## 2019-01-31 ENCOUNTER — Ambulatory Visit (INDEPENDENT_AMBULATORY_CARE_PROVIDER_SITE_OTHER): Payer: Medicare HMO | Admitting: Rehabilitative and Restorative Service Providers"

## 2019-01-31 ENCOUNTER — Encounter: Payer: Self-pay | Admitting: Rehabilitative and Restorative Service Providers"

## 2019-01-31 ENCOUNTER — Other Ambulatory Visit: Payer: Self-pay

## 2019-01-31 DIAGNOSIS — R293 Abnormal posture: Secondary | ICD-10-CM | POA: Diagnosis not present

## 2019-01-31 DIAGNOSIS — M25512 Pain in left shoulder: Secondary | ICD-10-CM

## 2019-01-31 DIAGNOSIS — M6281 Muscle weakness (generalized): Secondary | ICD-10-CM

## 2019-01-31 DIAGNOSIS — R29898 Other symptoms and signs involving the musculoskeletal system: Secondary | ICD-10-CM | POA: Diagnosis not present

## 2019-01-31 NOTE — Therapy (Signed)
[place] [address] [place], [place], [address] Phone: [phone]   Fax:  [phone]  Physical Therapy Treatment  Patient Details  Name: Kelli [name] MRN: [id number] Date of Birth: May 15, 1946 Referring Provider (PT): Dr [name]    Encounter Date: 01/31/2019  PT End of Session - 01/31/19 1111    Visit Number  22    Number of Visits  24    Date for PT Re-Evaluation  03/03/19    PT Start Time  1110    PT Stop Time  1145    PT Time Calculation (min)  35 min    Activity Tolerance  Patient tolerated treatment well       Past Medical History:  Diagnosis Date  . Anxiety   . Arthritis   . Borderline diabetic   . Colon cancer ([place])    colon ca dx 02/2009, lung cancer  . Depression   . Depression with anxiety 09/19/2011  . H/O colon cancer, stage II 09/19/2011   T3N0  Cecum Sept 2010  . High cholesterol   . Hyperlipidemia 09/19/2011  . Lung nodules 09/19/2011   granulomas  . Urinary urgency     Past Surgical History:  Procedure Laterality Date  . APPENDECTOMY    . BREAST SURGERY  1978   augmentation  . COLON SURGERY     2010  . CRYO INTERCOSTAL NERVE BLOCK N/A 10/05/2014   Procedure: CRYO INTERCOSTAL NERVE BLOCK;  Surgeon: [name], MD;  Location: [facility];  Service: Thoracic;  Laterality: N/A;  . LYMPH NODE DISSECTION Right 10/05/2014   Procedure: LYMPH NODE DISSECTION;  Surgeon: [name], MD;  Location: [facility];  Service: Thoracic;  Laterality: Right;  . ORIF HUMERUS FRACTURE Left 10/01/2018   Procedure: OPEN REDUCTION INTERNAL FIXATION (ORIF) LEFT PROXIMAL HUMERUS FRACTURE;  Surgeon: [name], MD;  Location: [facility];  Service: Orthopedics;  Laterality: Left;  . PARTIAL COLECTOMY Right 2010  . SEGMENTECOMY Right 10/05/2014   Procedure: right lower lobe SEGMENTECTOMY;  Surgeon: [name], MD;  Location: [facility];  Service: Thoracic;  Laterality: Right;  [name] VIDEO  ASSISTED THORACOSCOPY (VATS)/WEDGE RESECTION Right 10/05/2014   Procedure: Right VIDEO ASSISTED THORACOSCOPY (VATS) with right lower lobe lung nodule WEDGE RESECTION;  Surgeon: [name], MD;  Location: [facility] [place];  Service: Thoracic;  Laterality: Right;    There were no vitals filed for this visit.  Subjective Assessment - 01/31/19 1112    Subjective  Patient reports that she is working on her exercises at home and feels she is making progress.    Currently in Pain?  No/denies                       [facility] Adult PT Treatment/Exercise - 01/31/19 0001      Shoulder Exercises: Pulleys   Flexion  2 minutes    Scaption  2 minutes    Other Pulley Exercises  IR/ER shoulders at 90 deg bringing arms in and out in horzontal ab/adduction       Shoulder Exercises: Stretch   Wall Stretch - Flexion  20 seconds;2 reps    Table Stretch -Flexion Limitations  quadraped sitting back to stretch Lt shoulder into flexion - 20 sec x 6 reps       Manual Therapy   Manual therapy comments  pt supine     Soft tissue mobilization  working through [employer], upper trap, levator, rhomboid  Myofascial Release  Lt pec     Scapular Mobilization  Lt scapula in all planes    Passive ROM  Lt shoulder flexion, scaption, ER (with arm supported in scaption), ext, horiz abdct; extension  - to tissue limits and no pain.                PT Short Term Goals - 01/24/19 1614      PT SHORT TERM GOAL #1   Title  Instruct patient in improved posture and alignment with pt to demonstrate activation of posterior shoulder girdle musculature 12/10/2018    Time  6    Period  Weeks    Status  Partially Met      PT SHORT TERM GOAL #2   Title  Independent in initial HEP 12/10/2018    Baseline  encouraged compliance    Time  6    Period  Weeks    Status  Achieved      PT SHORT TERM GOAL #3   Title  Instruct patient in appropriate exercises for home pool 12/10/2018    Time  6    Period  Weeks    Status   Achieved        PT Long Term Goals - 01/24/19 1615      PT LONG TERM GOAL #1   Title  Improve posture and alignment with patient to demonstrate improved upright posture with posterior shoulder girdle engaged    Time  18    Period  Weeks    Status  On-going    Target Date  03/03/19      PT LONG TERM GOAL #2   Title  Increase AROM Lt shoulder to ~ 120 degrees shoulder elevation and 50-60 degrees ER and hand to sacrum IR    Baseline  pt able to get hand to sacrum; continued limitation with flexion/ ER    Time  18    Period  Weeks    Status  Partially Met    Target Date  03/03/19      PT LONG TERM GOAL #3   Title  Patient reports using Lt UE for functional activities including washing face; turning on/off light switch; etc    Baseline  improved functional use reported    Time  18    Period  Weeks    Status  Partially Met    Target Date  03/03/19      PT LONG TERM GOAL #4   Title  Independent in HEP    Time  18    Period  Weeks    Status  On-going    Target Date  03/03/19      PT LONG TERM GOAL #5   Title  Improve FOTO =/< 42% limited by    Time  18    Period  Weeks    Status  On-going    Target Date  03/03/19            Plan - 01/31/19 1112    Clinical Impression Statement  Working on ther ex; manual work; PROM; stretching Lt shoulder    Rehab Potential  Good    PT Frequency  2x / week    PT Duration  12 weeks    PT Treatment/Interventions  Patient/family education;ADLs/Self Care Home Management;Cryotherapy;Moist Heat;Iontophoresis 17m/ml Dexamethasone;Therapeutic activities;Therapeutic exercise;Neuromuscular re-education;Manual techniques;Dry needling    PT Next Visit Plan  Continue working on disassociation of scapula from shoulder; continue with DN as indicated.  PT Home Exercise Plan  Access Code: [email or web address]    ( patient has a pool at her home )    Consulted and Agree with Plan of Care  Patient       Patient will benefit from skilled therapeutic  intervention in order to improve the following deficits and impairments:  Postural dysfunction, Improper body mechanics, Pain, Increased fascial restricitons, Increased muscle spasms, Hypomobility, Decreased strength, Decreased mobility, Decreased range of motion, Decreased activity tolerance, Impaired UE functional use  Visit Diagnosis: Acute pain of left shoulder  Other symptoms and signs involving the musculoskeletal system  Abnormal posture  Muscle weakness (generalized)     Problem List Patient Active Problem List   Diagnosis Date Noted  . Closed fracture of left proximal humerus 09/26/2018  . Squamous cell carcinoma 03/05/2017  . Echocardiogram shows left ventricular diastolic dysfunction 92/33/0076  . Heart palpitations 07/25/2016  . Need for vaccination for pneumococcus 07/13/2016  . Osteoarthritis of hand, right 08/06/2015  . Diabetes mellitus type 2, uncomplicated ([place]) 22/63/3354  . Lung cancer, lower lobe ([place]) 10/09/2014  . Hematochezia 04/01/2013  . H/O colon cancer, stage II 09/19/2011  . Depression with anxiety 09/19/2011  . Hyperlipidemia 09/19/2011    Birtha Hatler Nilda Simmer PT, MPH  01/31/2019, 11:48 AM  [facility] [place] [address] [place], [place], [address] Phone: [phone]   Fax:  [phone]  Name: OLIWIA BERZINS MRN: [id number] Date of Birth: 22-Oct-1945

## 2019-02-05 ENCOUNTER — Other Ambulatory Visit: Payer: Self-pay

## 2019-02-05 ENCOUNTER — Encounter: Payer: Self-pay | Admitting: Rehabilitative and Restorative Service Providers"

## 2019-02-05 ENCOUNTER — Ambulatory Visit: Payer: Medicare HMO | Admitting: Rehabilitative and Restorative Service Providers"

## 2019-02-05 DIAGNOSIS — R293 Abnormal posture: Secondary | ICD-10-CM

## 2019-02-05 DIAGNOSIS — M25512 Pain in left shoulder: Secondary | ICD-10-CM

## 2019-02-05 DIAGNOSIS — R29898 Other symptoms and signs involving the musculoskeletal system: Secondary | ICD-10-CM | POA: Diagnosis not present

## 2019-02-05 DIAGNOSIS — M6281 Muscle weakness (generalized): Secondary | ICD-10-CM | POA: Diagnosis not present

## 2019-02-05 NOTE — Therapy (Signed)
[place], [place], [address] Phone: [phone]   Fax:  [phone]  Physical Therapy Treatment  Patient Details  Name: Kelli [name] MRN: [id number] Date of Birth: [birth year] Referring Provider (PT): Dr [name]    Encounter Date: 02/05/2019  PT End of Session - 02/05/19 1108    Visit Number  23    Number of Visits  24    Date for PT Re-Evaluation  03/03/19    PT Start Time  1104    PT Stop Time  1145    PT Time Calculation (min)  41 min    Activity Tolerance  Patient tolerated treatment well       Past Medical History:  Diagnosis Date  . Anxiety   . Arthritis   . Borderline diabetic   . Colon cancer ([place])    colon ca dx 02/2009, lung cancer  . Depression   . Depression with anxiety 09/19/2011  . H/O colon cancer, stage II 09/19/2011   T3N0  Cecum Sept 2010  . High cholesterol   . Hyperlipidemia 09/19/2011  . Lung nodules 09/19/2011   granulomas  . Urinary urgency     Past Surgical History:  Procedure Laterality Date  . APPENDECTOMY    . BREAST SURGERY  1978   augmentation  . COLON SURGERY     2010  . CRYO INTERCOSTAL NERVE BLOCK N/A 10/05/2014   Procedure: CRYO INTERCOSTAL NERVE BLOCK;  Surgeon: [name], MD;  Location: [facility];  Service: Thoracic;  Laterality: N/A;  . LYMPH NODE DISSECTION Right 10/05/2014   Procedure: LYMPH NODE DISSECTION;  Surgeon: [name], MD;  Location: [facility];  Service: Thoracic;  Laterality: Right;  . ORIF HUMERUS FRACTURE Left 10/01/2018   Procedure: OPEN REDUCTION INTERNAL FIXATION (ORIF) LEFT PROXIMAL HUMERUS FRACTURE;  Surgeon: [name], MD;  Location: [facility];  Service: Orthopedics;  Laterality: Left;  . PARTIAL COLECTOMY Right 2010  . SEGMENTECOMY Right 10/05/2014   Procedure: right lower lobe SEGMENTECTOMY;  Surgeon: [name], MD;  Location: [facility];  Service: Thoracic;  Laterality: Right;  [name] VIDEO  ASSISTED THORACOSCOPY (VATS)/WEDGE RESECTION Right 10/05/2014   Procedure: Right VIDEO ASSISTED THORACOSCOPY (VATS) with right lower lobe lung nodule WEDGE RESECTION;  Surgeon: [name], MD;  Location: [facility];  Service: Thoracic;  Laterality: Right;    There were no vitals filed for this visit.  Subjective Assessment - 02/05/19 1109    Subjective  Patient reports that he returns to MD next week. She does not want to stop therapy. Patient states that her shoulder is "not perfect". She can use Lt UE for functional activities.    Currently in Pain?  No/denies         [facility] PT Assessment - 02/05/19 0001      Assessment   Medical Diagnosis  Lt proximal humeral fracture     Referring Provider (PT)  Dr [name]     Onset Date/Surgical Date  10/01/18    Hand Dominance  Right    Next MD Visit  02/12/2019    Prior Therapy  here for Rt shoulder       AROM   Right Shoulder Extension  69 Degrees    Right Shoulder Flexion  147 Degrees    Right Shoulder ABduction  150 Degrees    Right Shoulder Internal Rotation  35 Degrees    Right Shoulder External Rotation  78 Degrees  Left Shoulder Extension  52 Degrees    Left Shoulder Flexion  95 Degrees    Left Shoulder ABduction  84 Degrees    Left Shoulder Internal Rotation  --   thumb to waist   Left Shoulder External Rotation  --   17     PROM   Left Shoulder Extension  58 Degrees    Left Shoulder Flexion  124 Degrees    Left Shoulder ABduction  116 Degrees   in scaption    Left Shoulder Internal Rotation  44 Degrees    Left Shoulder External Rotation  40 Degrees   arm in supported scaption, AAROM with cane     Strength   Overall Strength Comments  accepts resistance in all planes Lt shoulder                    [facility] Adult PT Treatment/Exercise - 02/05/19 0001      Shoulder Exercises: Standing   Flexion  Strengthening;Both;10 reps;Weights    Shoulder Flexion Weight (lbs)  2    Medical sales representative;Both     Theraband Level (Shoulder Row)  Level 4 (Blue)    Row Limitations  bow and arrow blue TB x 10 each UE     Retraction  Strengthening;Both;10 reps    Theraband Level (Shoulder Retraction)  Level 2 (Red)    Other Standing Exercises  scaption back at noodle 2 # wt x qo       Shoulder Exercises: Pulleys   Flexion  2 minutes    Scaption  2 minutes    Other Pulley Exercises  IR/ER shoulders at 90 deg bringing arms in and out in horzontal ab/adduction       Shoulder Exercises: Stretch   Internal Rotation Stretch  5 reps   standing with strap 10 sec hold    External Rotation Stretch  5 reps;10 seconds   standing with cane    Table Stretch -Flexion Limitations  quadraped sitting back to stretch Lt shoulder into flexion - 20 sec x 6 reps       Manual Therapy   Manual therapy comments  pt supine     Soft tissue mobilization  working through [employer], upper trap, levator, rhomboid     Myofascial Release  Lt pec     Scapular Mobilization  Lt scapula in all planes    Passive ROM  Lt shoulder flexion, scaption, ER (with arm supported in scaption), ext, horiz abdct; extension  - to tissue limits and no pain.                PT Short Term Goals - 02/05/19 1246      PT SHORT TERM GOAL #1   Title  Instruct patient in improved posture and alignment with pt to demonstrate activation of posterior shoulder girdle musculature 12/10/2018    Baseline  With verbal cues    Time  6    Period  Weeks    Status  Achieved      PT SHORT TERM GOAL #2   Title  Independent in initial HEP 12/10/2018    Baseline  encouraged compliance    Time  6    Period  Weeks    Status  Achieved      PT SHORT TERM GOAL #3   Title  Instruct patient in appropriate exercises for home pool 12/10/2018    Time  6    Period  Weeks    Status  Achieved  PT Long Term Goals - 02/05/19 1247      PT LONG TERM GOAL #1   Title  Improve posture and alignment with patient to demonstrate improved upright posture with posterior  shoulder girdle engaged    Time  18    Period  Weeks    Status  On-going    Target Date  03/03/19      PT LONG TERM GOAL #2   Title  Increase AROM Lt shoulder to ~ 120 degrees shoulder elevation and 50-60 degrees ER and hand to sacrum IR    Baseline  pt able to get hand to sacrum; continued limitation with flexion/ ER    Time  18    Period  Weeks    Status  Partially Met    Target Date  03/03/19      PT LONG TERM GOAL #3   Title  Patient reports using Lt UE for functional activities including washing face; turning on/off light switch; etc    Baseline  improved functional use reported    Time  18    Period  Weeks    Status  Partially Met    Target Date  03/03/19      PT LONG TERM GOAL #4   Title  Independent in HEP    Time  18    Period  Weeks    Status  On-going    Target Date  03/03/19      PT LONG TERM GOAL #5   Title  Improve FOTO =/< 42% limited by    Time  18    Status  On-going    Target Date  03/03/19            Plan - 02/05/19 1109    Clinical Impression Statement  Kelli [name] reports increased mobility and functional activities with Lt UE. She has pain with certain movements but not at rest or with functional activities. She demonstrates gradually increasing Lt shoulder AROM and PROM as well as improved strength. Kelli [name] has been instructed in a variety of ROM and strengthening exercises for home. She is nearing a point of independence with HEP and should be ready for discharge from Physical Therapy in the next 1-2 visits.    Rehab Potential  Good    PT Frequency  2x / week    PT Duration  12 weeks    PT Treatment/Interventions  Patient/family education;ADLs/Self Care Home Management;Cryotherapy;Moist Heat;Iontophoresis 11m/ml Dexamethasone;Therapeutic activities;Therapeutic exercise;Neuromuscular re-education;Manual techniques;Dry needling    PT Next Visit Plan  Continue working on disassociation of scapula from shoulder; continue with DN as indicated. Note to MD     PT Home Exercise Plan  Access Code: [email or web address]   ( patient has a pool at her home )    Consulted and Agree with Plan of Care  Patient       Patient will benefit from skilled therapeutic intervention in order to improve the following deficits and impairments:  Postural dysfunction, Improper body mechanics, Pain, Increased fascial restricitons, Increased muscle spasms, Hypomobility, Decreased strength, Decreased mobility, Decreased range of motion, Decreased activity tolerance, Impaired UE functional use  Visit Diagnosis: Acute pain of left shoulder  Other symptoms and signs involving the musculoskeletal system  Abnormal posture  Muscle weakness (generalized)     Problem List Patient Active Problem List   Diagnosis Date Noted  . Closed fracture of left proximal humerus 09/26/2018  . Squamous cell carcinoma 03/05/2017  . Echocardiogram shows left ventricular diastolic dysfunction 047/42/5956 .  Heart palpitations 07/25/2016  . Need for vaccination for pneumococcus 07/13/2016  . Osteoarthritis of hand, right 08/06/2015  . Diabetes mellitus type 2, uncomplicated ([place]) 02/21/8021  . Lung cancer, lower lobe ([place]) 10/09/2014  . Hematochezia 04/01/2013  . H/O colon cancer, stage II 09/19/2011  . Depression with anxiety 09/19/2011  . Hyperlipidemia 09/19/2011     Nilda Simmer PT, MPH  02/05/2019, 12:49 PM  [facility] [place], [place], [address] Phone: [phone]   Fax:  [phone]  Name: Kelli [name] MRN: [id number] Date of Birth: 10-22-1945

## 2019-02-12 ENCOUNTER — Encounter: Payer: Medicare HMO | Admitting: Rehabilitative and Restorative Service Providers"

## 2019-02-13 ENCOUNTER — Encounter: Payer: Self-pay | Admitting: Rehabilitative and Restorative Service Providers"

## 2019-02-13 ENCOUNTER — Other Ambulatory Visit: Payer: Self-pay

## 2019-02-13 ENCOUNTER — Ambulatory Visit: Payer: Medicare HMO | Admitting: Rehabilitative and Restorative Service Providers"

## 2019-02-13 DIAGNOSIS — R293 Abnormal posture: Secondary | ICD-10-CM

## 2019-02-13 DIAGNOSIS — R29898 Other symptoms and signs involving the musculoskeletal system: Secondary | ICD-10-CM | POA: Diagnosis not present

## 2019-02-13 DIAGNOSIS — M25512 Pain in left shoulder: Secondary | ICD-10-CM

## 2019-02-13 DIAGNOSIS — M6281 Muscle weakness (generalized): Secondary | ICD-10-CM

## 2019-02-13 NOTE — Therapy (Signed)
[place], [place], [address] Phone: [phone]   Fax:  [phone]  Physical Therapy Treatment  Patient Details  Name: Kelli [name] MRN: [id number] Date of Birth: 30-Apr-1946 Referring Provider (PT): Dr [name]    Encounter Date: 02/13/2019  PT End of Session - 02/13/19 1404    Visit Number  24    Number of Visits  24    Date for PT Re-Evaluation  03/03/19    PT Start Time  1402    PT Stop Time  0145    PT Time Calculation (min)  703 min    Activity Tolerance  Patient tolerated treatment well       Past Medical History:  Diagnosis Date  . Anxiety   . Arthritis   . Borderline diabetic   . Colon cancer ([place])    colon ca dx 02/2009, lung cancer  . Depression   . Depression with anxiety 09/19/2011  . H/O colon cancer, stage II 09/19/2011   T3N0  Cecum Sept 2010  . High cholesterol   . Hyperlipidemia 09/19/2011  . Lung nodules 09/19/2011   granulomas  . Urinary urgency     Past Surgical History:  Procedure Laterality Date  . APPENDECTOMY    . BREAST SURGERY  1978   augmentation  . COLON SURGERY     2010  . CRYO INTERCOSTAL NERVE BLOCK N/A 10/05/2014   Procedure: CRYO INTERCOSTAL NERVE BLOCK;  Surgeon: [name], MD;  Location: [facility];  Service: Thoracic;  Laterality: N/A;  . LYMPH NODE DISSECTION Right 10/05/2014   Procedure: LYMPH NODE DISSECTION;  Surgeon: [name], MD;  Location: [facility];  Service: Thoracic;  Laterality: Right;  . ORIF HUMERUS FRACTURE Left 10/01/2018   Procedure: OPEN REDUCTION INTERNAL FIXATION (ORIF) LEFT PROXIMAL HUMERUS FRACTURE;  Surgeon: [name], MD;  Location: [facility];  Service: Orthopedics;  Laterality: Left;  . PARTIAL COLECTOMY Right 2010  . SEGMENTECOMY Right 10/05/2014   Procedure: right lower lobe SEGMENTECTOMY;  Surgeon: [name], MD;  Location: [facility];  Service: Thoracic;  Laterality: Right;  [name] VIDEO  ASSISTED THORACOSCOPY (VATS)/WEDGE RESECTION Right 10/05/2014   Procedure: Right VIDEO ASSISTED THORACOSCOPY (VATS) with right lower lobe lung nodule WEDGE RESECTION;  Surgeon: [name], MD;  Location: [facility];  Service: Thoracic;  Laterality: Right;    There were no vitals filed for this visit.  Subjective Assessment - 02/13/19 1405    Subjective  Patient reports that she is working at home - she is using the arm more for functional activites.    Pain Score  0-No pain                       [facility] Adult PT Treatment/Exercise - 02/13/19 0001      Shoulder Exercises: Pulleys   Flexion  2 minutes    Scaption  2 minutes    Other Pulley Exercises  IR/ER shoulders at 90 deg bringing arms in and out in horzontal ab/adduction       Shoulder Exercises: Stretch   External Rotation Stretch  5 reps;10 seconds   standing with cane      Manual Therapy   Manual therapy comments  pt supine and sidelying     Soft tissue mobilization  working through [employer], upper trap, levator, rhomboid     Myofascial Release  Lt pec     Scapular Mobilization  Lt  scapula in all planes    Passive ROM  Lt shoulder flexion, scaption, ER (with arm supported in scaption), ext, horiz abdct; extension  - to tissue limits and no pain.        Trigger Point Dry Needling - 02/13/19 0001    Consent Given?  Yes    Education Handout Provided  Previously provided    Other Dry Needling  Lt     Upper Trapezius Response  Palpable increased muscle length;Twitch reponse elicited           PT Education - 02/13/19 1447    Education Details  Reviewed HEP encouraged consistent work at home    Person(s) Educated  Patient    Methods  Explanation    Comprehension  Verbalized understanding       PT Short Term Goals - 02/05/19 1246      PT SHORT TERM GOAL #1   Title  Instruct patient in improved posture and alignment with pt to demonstrate activation of posterior shoulder girdle musculature 12/10/2018     Baseline  With verbal cues    Time  6    Period  Weeks    Status  Achieved      PT SHORT TERM GOAL #2   Title  Independent in initial HEP 12/10/2018    Baseline  encouraged compliance    Time  6    Period  Weeks    Status  Achieved      PT SHORT TERM GOAL #3   Title  Instruct patient in appropriate exercises for home pool 12/10/2018    Time  6    Period  Weeks    Status  Achieved        PT Long Term Goals - 02/13/19 1539      PT LONG TERM GOAL #1   Title  Improve posture and alignment with patient to demonstrate improved upright posture with posterior shoulder girdle engaged    Time  18    Period  Weeks    Status  Partially Met      PT LONG TERM GOAL #2   Title  Increase AROM Lt shoulder to ~ 120 degrees shoulder elevation and 50-60 degrees ER and hand to sacrum IR    Baseline  pt able to get hand to sacrum; continued limitation with flexion/ ER    Time  18    Period  Weeks    Status  Partially Met      PT LONG TERM GOAL #3   Title  Patient reports using Lt UE for functional activities including washing face; turning on/off light switch; etc    Baseline  improved functional use reported    Time  18    Period  Weeks    Status  Achieved      PT LONG TERM GOAL #4   Title  Independent in HEP    Time  18    Period  Weeks    Status  Achieved      PT LONG TERM GOAL #5   Title  Improve FOTO =/< 42% limited by    Time  18    Period  Weeks    Status  Partially Met            Plan - 02/13/19 1405    Clinical Impression Statement  Kelli [name] reports continued functional improvement with Lt shoulder ROM and strength. She can use the Lt UE to wash the top of her head and is  cutting off corn, lifting several pounds of corn with both UE's. Kelli [name] has a variety of exercises and activities at home. She demonstrates increased ROM, mobility and strength Lt UE. Kelli [name] will continue with independent HEP and call with any questions or concerns.    Rehab Potential  Good    PT Frequency   2x / week    PT Duration  12 weeks    PT Treatment/Interventions  Patient/family education;ADLs/Self Care Home Management;Cryotherapy;Moist Heat;Iontophoresis 46m/ml Dexamethasone;Therapeutic activities;Therapeutic exercise;Neuromuscular re-education;Manual techniques;Dry needling    PT Next Visit Plan  d/c to independent HEP    PT Home Exercise Plan  Access Code: [email or web address]   ( patient has a pool at her home )    Consulted and Agree with Plan of Care  Patient       Patient will benefit from skilled therapeutic intervention in order to improve the following deficits and impairments:  Postural dysfunction, Improper body mechanics, Pain, Increased fascial restricitons, Increased muscle spasms, Hypomobility, Decreased strength, Decreased mobility, Decreased range of motion, Decreased activity tolerance, Impaired UE functional use  Visit Diagnosis: Acute pain of left shoulder  Other symptoms and signs involving the musculoskeletal system  Abnormal posture  Muscle weakness (generalized)     Problem List Patient Active Problem List   Diagnosis Date Noted  . Closed fracture of left proximal humerus 09/26/2018  . Squamous cell carcinoma 03/05/2017  . Echocardiogram shows left ventricular diastolic dysfunction 010/31/5945 . Heart palpitations 07/25/2016  . Need for vaccination for pneumococcus 07/13/2016  . Osteoarthritis of hand, right 08/06/2015  . Diabetes mellitus type 2, uncomplicated ([place] 085/92/9244 . Lung cancer, lower lobe ([place] 10/09/2014  . Hematochezia 04/01/2013  . H/O colon cancer, stage II 09/19/2011  . Depression with anxiety 09/19/2011  . Hyperlipidemia 09/19/2011    Violette Morneault PNilda SimmerPT, MPH  02/13/2019, 3:43 PM  [facility] [address]Phone: [phone]  Fax:  [phone] Name: Kelli TALSMAMRN: [id number] of Birth: 8Feb 13, 1947  PHYSICAL THERAPY DISCHARGE  SUMMARY  Visits from Start of Care: 24  Current functional level related to goals / functional outcomes: See progress note for discharge status    Remaining deficits: Continued end range tightness    Education / Equipment: HEP  Plan: Patient agrees to discharge.  Patient goals were partially met. Patient is being discharged due to                                                     ?????    Pt to continue with independent HEP  \Skylor Schnapp P. HHelene KelpPT, MPH 02/13/19 3:45 PM

## 2019-02-24 ENCOUNTER — Other Ambulatory Visit: Payer: Self-pay

## 2019-02-24 ENCOUNTER — Ambulatory Visit: Payer: Self-pay

## 2019-02-24 ENCOUNTER — Encounter: Payer: Self-pay | Admitting: Orthopedic Surgery

## 2019-02-24 ENCOUNTER — Ambulatory Visit (INDEPENDENT_AMBULATORY_CARE_PROVIDER_SITE_OTHER): Payer: Medicare HMO | Admitting: Orthopedic Surgery

## 2019-02-24 DIAGNOSIS — S42202D Unspecified fracture of upper end of left humerus, subsequent encounter for fracture with routine healing: Secondary | ICD-10-CM

## 2019-02-24 NOTE — Progress Notes (Signed)
Post-Op Visit Note   Patient: Kelli [name]           Date of Birth: 22-Jun-1945           MRN: [id number] Visit Date: 02/24/2019 PCP: Emeterio Reeve, DO   Assessment & Plan:  Chief Complaint:  Chief Complaint  Patient presents with  . Follow-up   Visit Diagnoses:  1. Closed fracture of proximal end of left humerus with routine healing, unspecified fracture morphology, subsequent encounter     Plan: Pt is a 73 y.o. Female who presents to the clinic s/p left proximal humerus ORIF on 10/01/2018.  She is doing well aside from some burning pain that she experiences lateral to the surgical incision.  Denies any numbness or tingling further down the arm or any neck pain.  Her incision is healing well.  She has good strength on exam.  She has 90 degrees of forward flexion, 5 degrees of external rotation, 70 degrees of abduction.  Patient finished her physical therapy 2 weeks ago and states it helped, especially the dry needling.  X-rays in the clinic today look good, with no evidence of avascular necrosis of the humeral head or further fracture/dislocation.  Patient is doing well and will follow-up with the office PRN.  Follow-Up Instructions: No follow-ups on file.   Orders:  Orders Placed This Encounter  Procedures  . XR Shoulder Left   No orders of the defined types were placed in this encounter.   Imaging: No results found.  PMFS History: Patient Active Problem List   Diagnosis Date Noted  . Closed fracture of left proximal humerus 09/26/2018  . Squamous cell carcinoma 03/05/2017  . Echocardiogram shows left ventricular diastolic dysfunction 56/81/2751  . Heart palpitations 07/25/2016  . Need for vaccination for pneumococcus 07/13/2016  . Osteoarthritis of hand, right 08/06/2015  . Diabetes mellitus type 2, uncomplicated ([place]) 70/06/7492  . Lung cancer, lower lobe ([place]) 10/09/2014  . Hematochezia 04/01/2013  . H/O colon cancer, stage II 09/19/2011  . Depression with  anxiety 09/19/2011  . Hyperlipidemia 09/19/2011   Past Medical History:  Diagnosis Date  . Anxiety   . Arthritis   . Borderline diabetic   . Colon cancer ([place])    colon ca dx 02/2009, lung cancer  . Depression   . Depression with anxiety 09/19/2011  . H/O colon cancer, stage II 09/19/2011   T3N0  Cecum Sept 2010  . High cholesterol   . Hyperlipidemia 09/19/2011  . Lung nodules 09/19/2011   granulomas  . Urinary urgency     Family History  Problem Relation Age of Onset  . Heart disease Mother   . Heart disease Brother     Past Surgical History:  Procedure Laterality Date  . APPENDECTOMY    . BREAST SURGERY  1978   augmentation  . COLON SURGERY     2010  . CRYO INTERCOSTAL NERVE BLOCK N/A 10/05/2014   Procedure: CRYO INTERCOSTAL NERVE BLOCK;  Surgeon: [name], MD;  Location: [facility];  Service: Thoracic;  Laterality: N/A;  . LYMPH NODE DISSECTION Right 10/05/2014   Procedure: LYMPH NODE DISSECTION;  Surgeon: [name], MD;  Location: [facility];  Service: Thoracic;  Laterality: Right;  . ORIF HUMERUS FRACTURE Left 10/01/2018   Procedure: OPEN REDUCTION INTERNAL FIXATION (ORIF) LEFT PROXIMAL HUMERUS FRACTURE;  Surgeon: [name], MD;  Location: [facility];  Service: Orthopedics;  Laterality: Left;  . PARTIAL COLECTOMY Right 2010  . SEGMENTECOMY Right 10/05/2014  Procedure: right lower lobe SEGMENTECTOMY;  Surgeon: [name], MD;  Location: [facility];  Service: Thoracic;  Laterality: Right;  [name] VIDEO ASSISTED THORACOSCOPY (VATS)/WEDGE RESECTION Right 10/05/2014   Procedure: Right VIDEO ASSISTED THORACOSCOPY (VATS) with right lower lobe lung nodule WEDGE RESECTION;  Surgeon: [name], MD;  Location: [facility];  Service: Thoracic;  Laterality: Right;   Social History   Occupational History  . Not on file  Tobacco Use  . Smoking status: Former Smoker    Packs/day: 3.00    Years: 40.00    Pack years: 120.00    Types: Cigarettes    Quit date:  02/06/2009    Years since quitting: 10.0  . Smokeless tobacco: Never Used  Substance and Sexual Activity  . Alcohol use: Yes    Alcohol/week: 0.0 standard drinks    Comment: 3-4 times a week 1-2 beers  . Drug use: No  . Sexual activity: Never    Birth control/protection: Post-menopausal

## 2019-02-28 ENCOUNTER — Encounter: Payer: Self-pay | Admitting: Orthopedic Surgery

## 2019-03-03 ENCOUNTER — Other Ambulatory Visit: Payer: Self-pay | Admitting: Family Medicine

## 2019-03-04 ENCOUNTER — Other Ambulatory Visit: Payer: Self-pay | Admitting: Family Medicine

## 2019-03-07 ENCOUNTER — Encounter: Payer: Self-pay | Admitting: Family Medicine

## 2019-03-07 ENCOUNTER — Ambulatory Visit (INDEPENDENT_AMBULATORY_CARE_PROVIDER_SITE_OTHER): Payer: Medicare HMO | Admitting: Family Medicine

## 2019-03-07 ENCOUNTER — Other Ambulatory Visit: Payer: Self-pay

## 2019-03-07 VITALS — BP 129/79 | HR 89 | Wt 178.0 lb

## 2019-03-07 DIAGNOSIS — M25511 Pain in right shoulder: Secondary | ICD-10-CM

## 2019-03-07 DIAGNOSIS — R69 Illness, unspecified: Secondary | ICD-10-CM | POA: Diagnosis not present

## 2019-03-07 DIAGNOSIS — G4709 Other insomnia: Secondary | ICD-10-CM

## 2019-03-07 DIAGNOSIS — E119 Type 2 diabetes mellitus without complications: Secondary | ICD-10-CM | POA: Diagnosis not present

## 2019-03-07 MED ORDER — CLONIDINE HCL 0.1 MG PO TABS: 0.1000 mg | ORAL_TABLET | Freq: Every day | ORAL | 3 refills | Status: DC

## 2019-03-07 MED ORDER — GABAPENTIN 300 MG PO CAPS: 300.0000 mg | ORAL_CAPSULE | Freq: Every evening | ORAL | 3 refills | Status: DC | PRN

## 2019-03-07 NOTE — Progress Notes (Signed)
ANALEESE [name] is a 73 y.o. female who presents to [facility] today for right shoulder pain.  Patient has a two-week history of right shoulder pain.  She has a history of rotator cuff tendinopathy or subacromial bursitis last treated in February.  She had subacromial injection which worked until about 2 weeks ago.  She denies any injury or change in activity.  Pain is worse with overhead motion reaching back and at bedtime.  She tries over-the-counter medications for pain which help a little.  Additionally she has a history of insomnia that I seen her for previously.  She was pretty well managed with clonidine and gabapentin.  She would like a refill if possible.  Lastly she has been seeing her primary care provider my partner Dr. Sheppard Coil for diabetes and had titration of her glargine insulin starting at 10 currently on 16 units.  She notes her blood sugar still elevated into the 190s.  She has follow-up with her PCP next week.    ROS:  As above  Exam:  BP 129/79   Pulse 89   Wt 178 lb (80.7 kg)   LMP  (LMP Unknown)   BMI 27.06 kg/m  Wt Readings from Last 5 Encounters:  03/07/19 178 lb (80.7 kg)  12/11/18 176 lb 14.4 oz (80.2 kg)  11/26/18 174 lb (78.9 kg)  10/01/18 181 lb 3.2 oz (82.2 kg)  09/11/18 181 lb 3.2 oz (82.2 kg)   General: Well Developed, well nourished, and in no acute distress.  Neuro/Psych: Alert and oriented x3, extra-ocular muscles intact, able to move all 4 extremities, sensation grossly intact. Skin: Warm and dry, no rashes noted.  Respiratory: Not using accessory muscles, speaking in full sentences, trachea midline.  Cardiovascular: Pulses palpable, no extremity edema. Abdomen: Does not appear distended. MSK: Right shoulder normal-appearing Decreased range of motion to abduction external/internal rotation. Positive Hawkins and Neer's test.  Intact strength.  Pulses cap refill and sensation are intact distally.    Lab and Radiology Results Procedure: Real-time Ultrasound Guided Injection of right subacromial bursa Device: GE Logiq E   Images permanently stored and available for review in the ultrasound unit. Verbal informed consent obtained.  Discussed risks and benefits of procedure. Warned about infection bleeding damage to structures skin hypopigmentation and fat atrophy among others. Patient expresses understanding and agreement Time-out conducted.   Noted no overlying erythema, induration, or other signs of local infection.   Skin prepped in a sterile fashion.   Local anesthesia: Topical Ethyl chloride.   With sterile technique and under real time ultrasound guidance:  40 mg of Kenalog and 2 mL of Marcaine injected easily.   Completed without difficulty   Pain immediately resolved suggesting accurate placement of the medication.   Advised to call if fevers/chills, erythema, induration, drainage, or persistent bleeding.   Images permanently stored and available for review in the ultrasound unit.  Impression: Technically successful ultrasound guided injection.         Assessment and Plan: 73 y.o. female with  Right shoulder pain: Subacromial bursitis.  Plan for injection as above as well as home exercise program.  Recheck if not improving.  Insomnia: Refilled gabapentin and clonidine.  This unusual regimen has been quite effective and seems safe for her.  Diabetes: Blood sugar still elevated.  Plan to increase to 20 units now and follow back up with PCP as scheduled next week.  Advised her to bring her insulin pen with her so that  she can show Dr. Sheppard Coil how she is doing the injections.  Is possible that she is performing injections incorrectly.   PDMP not reviewed this encounter. No orders of the defined types were placed in this encounter.  Meds ordered this encounter  Medications  . cloNIDine (CATAPRES) 0.1 MG tablet    Sig: Take 1 tablet (0.1 mg total) by mouth at bedtime.     Dispense:  90 tablet    Refill:  3  . gabapentin (NEURONTIN) 300 MG capsule    Sig: Take 1 capsule (300 mg total) by mouth at bedtime as needed (insomnia).    Dispense:  90 capsule    Refill:  3    Historical information moved to improve visibility of documentation.  Past Medical History:  Diagnosis Date  . Anxiety   . Arthritis   . Borderline diabetic   . Colon cancer ([place])    colon ca dx 02/2009, lung cancer  . Depression   . Depression with anxiety 09/19/2011  . H/O colon cancer, stage II 09/19/2011   T3N0  Cecum Sept 2010  . High cholesterol   . Hyperlipidemia 09/19/2011  . Lung nodules 09/19/2011   granulomas  . Urinary urgency    Past Surgical History:  Procedure Laterality Date  . APPENDECTOMY    . BREAST SURGERY  1978   augmentation  . COLON SURGERY     2010  . CRYO INTERCOSTAL NERVE BLOCK N/A 10/05/2014   Procedure: CRYO INTERCOSTAL NERVE BLOCK;  Surgeon: [name], MD;  Location: [facility];  Service: Thoracic;  Laterality: N/A;  . LYMPH NODE DISSECTION Right 10/05/2014   Procedure: LYMPH NODE DISSECTION;  Surgeon: [name], MD;  Location: [place] [facility];  Service: Thoracic;  Laterality: Right;  . ORIF HUMERUS FRACTURE Left 10/01/2018   Procedure: OPEN REDUCTION INTERNAL FIXATION (ORIF) LEFT PROXIMAL HUMERUS FRACTURE;  Surgeon: [name], MD;  Location: [facility];  Service: Orthopedics;  Laterality: Left;  . PARTIAL COLECTOMY Right 2010  . SEGMENTECOMY Right 10/05/2014   Procedure: right lower lobe SEGMENTECTOMY;  Surgeon: [name], MD;  Location: [place];  Service: Thoracic;  Laterality: Right;  [name] VIDEO ASSISTED THORACOSCOPY (VATS)/WEDGE RESECTION Right 10/05/2014   Procedure: Right VIDEO ASSISTED THORACOSCOPY (VATS) with right lower lobe lung nodule WEDGE RESECTION;  Surgeon: [name], MD;  Location: [facility];  Service: Thoracic;  Laterality: Right;   Social History   Tobacco Use  . Smoking status: Former Smoker    Packs/day: 3.00     Years: 40.00    Pack years: 120.00    Types: Cigarettes    Quit date: 02/06/2009    Years since quitting: 10.0  . Smokeless tobacco: Never Used  Substance Use Topics  . Alcohol use: Yes    Alcohol/week: 0.0 standard drinks    Comment: 3-4 times a week 1-2 beers   family history includes Heart disease in her brother and mother.  Medications: Current Outpatient Medications  Medication Sig Dispense Refill  . AMBULATORY NON FORMULARY MEDICATION Single glucometer with lancets, test strips. Test daily.  Disp qs 3 months E11.9 1 each 0  . aspirin 81 MG tablet Take 81 mg by mouth daily.    . B Complex-C (B-COMPLEX WITH VITAMIN C) tablet Take 1 tablet by mouth daily.    . Cholecalciferol (VITAMIN D) 2000 UNITS tablet Take 2,000 Units by mouth daily.    . cloNIDine (CATAPRES) 0.1 MG tablet Take 1 tablet (0.1 mg total) by mouth at  bedtime. 90 tablet 3  . FLUoxetine (PROZAC) 40 MG capsule Take 1 capsule (40 mg total) by mouth daily. (Patient taking differently: Take 40 mg by mouth daily with lunch. ) 90 capsule 3  . gabapentin (NEURONTIN) 300 MG capsule Take 1 capsule (300 mg total) by mouth at bedtime as needed (insomnia). 90 capsule 3  . HYDROcodone-acetaminophen (NORCO/VICODIN) 5-325 MG tablet 1 po q 8-12 hrs prn pain 30 tablet 0  . Insulin Glargine (BASAGLAR KWIKPEN) 100 UNIT/ML SOPN Inject 0.1 mLs (10 Units total) into the skin at bedtime. Increase as directed: increase by 2 units every 3 days t fasting blood sugar goal 120-150. Max dose 60 units 5 pen 11  . Lancets (ONETOUCH DELICA PLUS [id number]) MISC TEST BLOOD SUGARS DAILY 100 each 0  . ONETOUCH VERIO test strip TEST BLOOD SUGARS DAILY 100 strip 0  . pravastatin (PRAVACHOL) 80 MG tablet Take 1 tablet (80 mg total) by mouth daily. (Patient taking differently: Take 80 mg by mouth daily with lunch. ) 90 tablet 3   No current facility-administered medications for this visit.    Allergies  Allergen Reactions  . Atorvastatin Other (See  Comments)    Muscle cramps  . Glipizide Er Other (See Comments)    Shaking / tingling in fingers       Discussed warning signs or symptoms. Please see discharge instructions. Patient expresses understanding.

## 2019-03-07 NOTE — Patient Instructions (Signed)
Thank you for coming in today.  Call or go to the ER if you develop a large red swollen joint with extreme pain or oozing puss.   Continue home exercise for shoulder pain.   For blood sugar ok to increase the insulin form 16 units to 20 units daily now and follow up with Dr Sheppard Coil next week.  Try doing the injection in the belly skin.

## 2019-03-12 DIAGNOSIS — E119 Type 2 diabetes mellitus without complications: Secondary | ICD-10-CM | POA: Diagnosis not present

## 2019-03-12 DIAGNOSIS — Z0000 Encounter for general adult medical examination without abnormal findings: Secondary | ICD-10-CM | POA: Diagnosis not present

## 2019-03-12 DIAGNOSIS — G4709 Other insomnia: Secondary | ICD-10-CM | POA: Diagnosis not present

## 2019-03-13 ENCOUNTER — Ambulatory Visit (INDEPENDENT_AMBULATORY_CARE_PROVIDER_SITE_OTHER): Payer: Medicare HMO | Admitting: Osteopathic Medicine

## 2019-03-13 ENCOUNTER — Other Ambulatory Visit: Payer: Self-pay

## 2019-03-13 ENCOUNTER — Encounter: Payer: Self-pay | Admitting: Osteopathic Medicine

## 2019-03-13 VITALS — BP 129/63 | HR 79 | Temp 98.3°F | Wt 181.3 lb

## 2019-03-13 DIAGNOSIS — Z794 Long term (current) use of insulin: Secondary | ICD-10-CM | POA: Diagnosis not present

## 2019-03-13 DIAGNOSIS — E1169 Type 2 diabetes mellitus with other specified complication: Secondary | ICD-10-CM

## 2019-03-13 DIAGNOSIS — Z23 Encounter for immunization: Secondary | ICD-10-CM | POA: Diagnosis not present

## 2019-03-13 LAB — CBC
HCT: 37 % (ref 35.0–45.0)
Hemoglobin: 12.3 g/dL (ref 11.7–15.5)
MCH: 30.9 pg (ref 27.0–33.0)
MCHC: 33.2 g/dL (ref 32.0–36.0)
MCV: 93 fL (ref 80.0–100.0)
MPV: 11.1 fL (ref 7.5–12.5)
Platelets: 333 10*3/uL (ref 140–400)
RBC: 3.98 10*6/uL (ref 3.80–5.10)
RDW: 13.5 % (ref 11.0–15.0)
WBC: 9.1 10*3/uL (ref 3.8–10.8)

## 2019-03-13 LAB — LIPID PANEL
Cholesterol: 221 mg/dL — ABNORMAL HIGH (ref <200)
HDL: 53 mg/dL (ref >=50)
LDL Cholesterol (Calc): 141 mg/dL (calc) — ABNORMAL HIGH
Non-HDL Cholesterol (Calc): 168 mg/dL (calc) — ABNORMAL HIGH (ref <130)
Total CHOL/HDL Ratio: 4.2 (calc) (ref <5.0)
Triglycerides: 143 mg/dL (ref <150)

## 2019-03-13 LAB — COMPLETE METABOLIC PANEL WITH GFR
AG Ratio: 1.7 (calc) (ref 1.0–2.5)
ALT: 16 U/L (ref 6–29)
AST: 13 U/L (ref 10–35)
Albumin: 4.2 g/dL (ref 3.6–5.1)
Alkaline phosphatase (APISO): 91 U/L (ref 37–153)
BUN: 18 mg/dL (ref 7–25)
CO2: 27 mmol/L (ref 20–32)
Calcium: 9.5 mg/dL (ref 8.6–10.4)
Chloride: 105 mmol/L (ref 98–110)
Creat: 0.81 mg/dL (ref 0.60–0.93)
GFR, Est African American: 85 mL/min/{1.73_m2} (ref >=60)
GFR, Est Non African American: 73 mL/min/{1.73_m2} (ref >=60)
Globulin: 2.5 g/dL (calc) (ref 1.9–3.7)
Glucose, Bld: 176 mg/dL — ABNORMAL HIGH (ref 65–99)
Potassium: 4.4 mmol/L (ref 3.5–5.3)
Sodium: 140 mmol/L (ref 135–146)
Total Bilirubin: 0.3 mg/dL (ref 0.2–1.2)
Total Protein: 6.7 g/dL (ref 6.1–8.1)

## 2019-03-13 LAB — HEMOGLOBIN A1C
Hgb A1c MFr Bld: 7.9 % of total Hgb — ABNORMAL HIGH (ref <5.7)
Mean Plasma Glucose: 180 (calc)
eAG (mmol/L): 10 (calc)

## 2019-03-13 LAB — TSH: TSH: 3.63 mIU/L (ref 0.40–4.50)

## 2019-03-13 NOTE — Progress Notes (Signed)
HPI: Kelli [name] is a 73 y.o. female who  has a past medical history of Anxiety, Arthritis, Borderline diabetic, Colon cancer ([place]), Depression, Depression with anxiety (09/19/2011), H/O colon cancer, stage II (09/19/2011), High cholesterol, Hyperlipidemia (09/19/2011), Lung nodules (09/19/2011), and Urinary urgency.  she presents to [facility] today, 03/13/19,  for chief complaint of: follow up diabetes/A1C check  Type 2 Diabetes - Patient here for follow up of diabetes. Patient was seen by Dr. [name] on 10/2 where her Basaglar was increased from 12 units to 20 units qhs.  Fasting blood glucose on 03/12/19 was 176. Patient reports blood sugars in AM at home in the late 200s. This morning sugars were in the 100s. Patient has stopped drinking beer and eating extra sugar. Previous A1cs from 3 months ago: 9.9. On 03/12/19 A1C was 7.9. - Patient reports frequent fatigue. Denies chest pain, vision changes, leg swelling.   At today's visit 03/13/19 ... PMH, PSH, FH reviewed and updated as needed.  Current medication list and allergy/intolerance hx reviewed and updated as needed. (See remainder of HPI, ROS, Phys Exam below)   No results found.  Results for orders placed or performed in visit on 12/11/18 (from the past 72 hour(s))  CBC     Status: None   Collection Time: 03/12/19 11:23 AM  Result Value Ref Range   WBC 9.1 3.8 - 10.8 Thousand/uL   RBC 3.98 3.80 - 5.10 Million/uL   Hemoglobin 12.3 11.7 - 15.5 g/dL   HCT 37.0 35.0 - 45.0 %   MCV 93.0 80.0 - 100.0 fL   MCH 30.9 27.0 - 33.0 pg   MCHC 33.2 32.0 - 36.0 g/dL   RDW 13.5 11.0 - 15.0 %   Platelets 333 140 - 400 Thousand/uL   MPV 11.1 7.5 - 12.5 fL  COMPLETE METABOLIC PANEL WITH GFR     Status: Abnormal   Collection Time: 03/12/19 11:23 AM  Result Value Ref Range   Glucose, Bld 176 (H) 65 - 99 mg/dL    Comment: .            Fasting reference interval . For someone without known diabetes, a  glucose value >125 mg/dL indicates that they may have diabetes and this should be confirmed with a follow-up test. .    BUN 18 7 - 25 mg/dL   Creat 0.81 0.60 - 0.93 mg/dL    Comment: For patients >38 years of age, the reference limit for Creatinine is approximately 13% higher for people identified as African-American. .    GFR, Est Non African American 73 > OR = 60 mL/min/1.27m2   GFR, Est African American 85 > OR = 60 mL/min/1.61m2   BUN/Creatinine Ratio NOT APPLICABLE 6 - 22 (calc)   Sodium 140 135 - 146 mmol/L   Potassium 4.4 3.5 - 5.3 mmol/L   Chloride 105 98 - 110 mmol/L   CO2 27 20 - 32 mmol/L   Calcium 9.5 8.6 - 10.4 mg/dL   Total Protein 6.7 6.1 - 8.1 g/dL   Albumin 4.2 3.6 - 5.1 g/dL   Globulin 2.5 1.9 - 3.7 g/dL (calc)   AG Ratio 1.7 1.0 - 2.5 (calc)   Total Bilirubin 0.3 0.2 - 1.2 mg/dL   Alkaline phosphatase (APISO) 91 37 - 153 U/L   AST 13 10 - 35 U/L   ALT 16 6 - 29 U/L  Lipid panel     Status: Abnormal   Collection Time: 03/12/19 11:23 AM  Result Value Ref Range   Cholesterol 221 (H) <200 mg/dL   HDL 53 > OR = 50 mg/dL   Triglycerides 143 <150 mg/dL   LDL Cholesterol (Calc) 141 (H) mg/dL (calc)    Comment: Reference range: <100 . Desirable range <100 mg/dL for primary prevention;   <70 mg/dL for patients with CHD or diabetic patients  with > or = 2 CHD risk factors. [name] LDL-C is now calculated using the Martin-Hopkins  calculation, which is a validated novel method providing  better accuracy than the Friedewald equation in the  estimation of LDL-C.  Cresenciano Genre et al. Annamaria Helling. 1275;170(01): 2061-2068  (http://education.[employer].com/faq/FAQ164)    Total CHOL/HDL Ratio 4.2 <5.0 (calc)   Non-HDL Cholesterol (Calc) 168 (H) <130 mg/dL (calc)    Comment: For patients with diabetes plus 1 major ASCVD risk  factor, treating to a non-HDL-C goal of <100 mg/dL  (LDL-C of <70 mg/dL) is considered a therapeutic  option.   TSH     Status: None   Collection  Time: 03/12/19 11:23 AM  Result Value Ref Range   TSH 3.63 0.40 - 4.50 mIU/L  Hemoglobin A1c     Status: Abnormal   Collection Time: 03/12/19 11:23 AM  Result Value Ref Range   Hgb A1c MFr Bld 7.9 (H) <5.7 % of total Hgb    Comment: For someone without known diabetes, a hemoglobin A1c value of 6.5% or greater indicates that they may have  diabetes and this should be confirmed with a follow-up  test. . For someone with known diabetes, a value <7% indicates  that their diabetes is well controlled and a value  greater than or equal to 7% indicates suboptimal  control. A1c targets should be individualized based on  duration of diabetes, age, comorbid conditions, and  other considerations. . Currently, no consensus exists regarding use of hemoglobin A1c for diagnosis of diabetes for children. .    Mean Plasma Glucose 180 (calc)   eAG (mmol/L) 10.0 (calc)          ASSESSMENT/PLAN: The primary encounter diagnosis was Type 2 diabetes mellitus with other specified complication, with long-term current use of insulin ([place]). A diagnosis of Need for influenza vaccination was also pertinent to this visit.   Kelli [name] is a 73 y.o. female being seen for a follow up of Diabetes. Will continue Basaglar at 20 units qhs given improving A1C and recent blood sugars. If sugars drop below 100 or patient begins to experience symptoms of hypoglycemia, advised patient to titrate down to 16 units and call me! Plan to follow up in 3 months for A1C recheck.    Orders Placed This Encounter  Procedures  . Flu Vaccine QUAD High Dose(Fluad)         Follow-up plan: Return in about 3 months (around 06/13/2019) for recheck A1C - sooner if needed!  .                                                 ################################################# ################################################# ################################################# #################################################    No outpatient medications have been marked as taking for the 03/13/19 encounter (Appointment) with Emeterio Reeve, DO.    Allergies  Allergen Reactions  . Atorvastatin Other (See Comments)    Muscle cramps  . Glipizide Er Other (See Comments)    Shaking / tingling in fingers  Review of Systems:  Constitutional: Fatigue. No recent illness  HEENT: Regular headaches but not worsening  Cardiac: No  chest pain, No  pressure, No palpitations  Respiratory:  No  shortness of breath.   Neurologic: No  weakness, No  Dizziness. Some tingling in arm likely due to shoulder surgery this year.   Exam:  BP 129/63 (BP Location: Left Arm, Patient Position: Sitting, Cuff Size: Normal)   Pulse 79   Temp 98.3 F (36.8 C) (Oral)   Wt 181 lb 4.8 oz (82.2 kg)   LMP  (LMP Unknown)   BMI 27.57 kg/m   Constitutional: VS see above. General Appearance: alert, well-developed, well-nourished, NAD  Neck: No masses, trachea midline.   Respiratory: Normal respiratory effort. no wheeze, no rhonchi, no rales  Cardiovascular: S1/S2 normal, no murmur, no rub/gallop auscultated. RRR.   Neurological: Normal balance/coordination. No tremor.  Skin: warm, dry, intact.   Psychiatric: Normal judgment/insight. Normal mood and affect. Oriented x3.       Visit summary with medication list and pertinent instructions was printed for patient to review, patient was advised to alert [place] if any updates are needed. All questions at time of visit were answered - patient instructed to contact office with any additional concerns. ER/RTC precautions were reviewed with the patient and understanding verbalized.    Note: Total time spent 25 minutes, greater than 50% of the visit was spent face-to-face counseling and coordinating care for the following: The primary encounter diagnosis was Type 2 diabetes mellitus with other specified complication, with long-term current use of insulin ([place]). A diagnosis of Need for influenza vaccination was also pertinent to this visit.[name]  Please note: voice recognition software was used to produce this document, and typos may escape review. Please contact Dr. Sheppard Coil for any needed clarifications.    Follow up plan: Return in about 3 months (around 06/13/2019) for recheck A1C - sooner if needed! [name]

## 2019-03-14 ENCOUNTER — Encounter: Payer: Self-pay | Admitting: Osteopathic Medicine

## 2019-03-19 ENCOUNTER — Other Ambulatory Visit: Payer: Self-pay | Admitting: Osteopathic Medicine

## 2019-04-09 ENCOUNTER — Ambulatory Visit (HOSPITAL_COMMUNITY)
Admission: RE | Admit: 2019-04-09 | Discharge: 2019-04-09 | Disposition: A | Discharge status: Home / Self Care | Payer: Medicare HMO | Source: Ambulatory Visit | Attending: Oncology | Admitting: Oncology

## 2019-04-09 ENCOUNTER — Other Ambulatory Visit: Payer: Self-pay

## 2019-04-09 DIAGNOSIS — C3490 Malignant neoplasm of unspecified part of unspecified bronchus or lung: Secondary | ICD-10-CM | POA: Diagnosis not present

## 2019-04-09 DIAGNOSIS — C3431 Malignant neoplasm of lower lobe, right bronchus or lung: Secondary | ICD-10-CM

## 2019-04-09 DIAGNOSIS — C189 Malignant neoplasm of colon, unspecified: Secondary | ICD-10-CM | POA: Diagnosis not present

## 2019-04-11 ENCOUNTER — Other Ambulatory Visit: Payer: Self-pay

## 2019-04-11 ENCOUNTER — Inpatient Hospital Stay: Payer: Medicare HMO | Attending: Oncology | Admitting: Oncology

## 2019-04-11 VITALS — BP 101/51 | HR 75 | Temp 97.9°F | Resp 17 | Ht 68.0 in | Wt 177.9 lb

## 2019-04-11 DIAGNOSIS — Z85118 Personal history of other malignant neoplasm of bronchus and lung: Secondary | ICD-10-CM | POA: Diagnosis not present

## 2019-04-11 DIAGNOSIS — Z87891 Personal history of nicotine dependence: Secondary | ICD-10-CM | POA: Diagnosis not present

## 2019-04-11 DIAGNOSIS — Z86010 Personal history of colonic polyps: Secondary | ICD-10-CM | POA: Diagnosis not present

## 2019-04-11 DIAGNOSIS — Z85038 Personal history of other malignant neoplasm of large intestine: Secondary | ICD-10-CM | POA: Diagnosis not present

## 2019-04-11 DIAGNOSIS — C3431 Malignant neoplasm of lower lobe, right bronchus or lung: Secondary | ICD-10-CM | POA: Diagnosis not present

## 2019-04-11 DIAGNOSIS — R918 Other nonspecific abnormal finding of lung field: Secondary | ICD-10-CM | POA: Diagnosis not present

## 2019-04-11 NOTE — Progress Notes (Signed)
[facility] OFFICE PROGRESS NOTE   Diagnosis: Non-small cell lung cancer, colon cancer  INTERVAL HISTORY:   Kelli [name] returns as scheduled.  She feels well.  She fell earlier this year and developed a left proximal humerus fracture.  She underwent surgical repair on 10/01/2018.  She continues to have limited range of motion at the shoulder.  Objective:  Vital signs in last 24 hours:  Blood pressure (!) 101/51, pulse 75, temperature 97.9 F (36.6 C), temperature source Temporal, resp. rate 17, height 5' 8" (1.727 m), weight 177 lb 14.4 oz (80.7 kg), SpO2 100 %.    HEENT: Neck without mass Lymphatics: No cervical, supraclavicular, axillary, or inguinal nodes Resp: Decreased breath sounds with end inspiratory rales at the right lower posterior chest, no respiratory distress Cardio: Distant heart sounds, regular rate and rhythm GI: No hepatosplenomegaly, nontender, no mass Vascular: No leg edema   Lab Results:  Lab Results  Component Value Date   WBC 9.1 03/12/2019   HGB 12.3 03/12/2019   HCT 37.0 03/12/2019   MCV 93.0 03/12/2019   PLT 333 03/12/2019   NEUTROABS 4,319 08/13/2018    CMP  Lab Results  Component Value Date   NA 140 03/12/2019   K 4.4 03/12/2019   CL 105 03/12/2019   CO2 27 03/12/2019   GLUCOSE 176 (H) 03/12/2019   BUN 18 03/12/2019   CREATININE 0.81 03/12/2019   CALCIUM 9.5 03/12/2019   PROT 6.7 03/12/2019   ALBUMIN 4.0 10/31/2016   AST 13 03/12/2019   ALT 16 03/12/2019   ALKPHOS 90 10/31/2016   BILITOT 0.3 03/12/2019   GFRNONAA 73 03/12/2019   GFRAA 85 03/12/2019    Imaging:  Ct Chest Wo Contrast  Result Date: 04/09/2019 CLINICAL DATA:  Restaging lung and colon cancer. EXAM: CT CHEST WITHOUT CONTRAST TECHNIQUE: Multidetector CT imaging of the chest was performed following the standard protocol without IV contrast. COMPARISON:  03/25/2018 FINDINGS: Cardiovascular: The heart size appears normal. Aortic atherosclerosis. Lad  coronary artery calcifications. Mediastinum/Nodes: No enlarged mediastinal or axillary lymph nodes. Thyroid gland, trachea, and esophagus demonstrate no significant findings. Lungs/Pleura: Chronic right posterior pleural thickening is again noted. Wedge resection has been performed involving the anterior right lower lobe. Scattered, bilateral pulmonary nodules are again identified. These appear similar in size, number and distribution to the previous exam. The index nodule within the subpleural left upper lobe measures 6 mm, image 58/7. Unchanged. No new or enlarging lung nodules identified. Upper Abdomen: 6 mm low-density structure in lateral segment of left lobe of liver noted. Unchanged. No acute findings. Musculoskeletal: No chest wall mass or suspicious bone lesions identified. IMPRESSION: 1. Stable postsurgical changes from right lower lobe wedge resection 2. Scattered bilateral pulmonary nodules in both lungs appears stable compared to prior exam and are favored to represent a benign process. 3. Coronary artery calcifications Aortic Atherosclerosis (ICD10-I70.0). Electronically Signed   By: Kerby Moors M.D.   On: 04/09/2019 14:51    Medications: I have reviewed the patient's current medications.   Assessment/Plan: 1. 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 History of colonic polyps-status post a surveillance colonoscopy in February 2016 with a tubular adenoma removed 3. History of heavy tobacco use, quit in 2010 4. History of lung nodules-last imaging was an abdomen CT in October 2013 prior to repeat imaging 09/03/2014  CT 09/03/2014 revealed enlargement of a spiculated right lower lobe nodule  PET  scan 09/16/2014 revealed low-level FDG activity associated with the right lower lobe spiculated nodule  Status post a right lower lobe basilar segmentectomy and mediastinal lymph node dissection on  10/06/2014 with the pathology confirming a minimally invasive well-differentiated adenocarcinoma,pT1a,N0, invasive component measured less than 0.3 cm, with negative surgical margins  CT chest 09/15/2015 with no evidence of recurrent lung cancer, stable nodules except for a probable new 4 mm right lower lobe subpleural nodule  CT chest 06/19/2016-no evidence of recurrent lung cancer, stable lung nodules  CT chest 03/20/2017-stable lung nodules  CT chest 03/26/2018- stable lung nodules  CT chest 04/09/2019-stable lung nodules  5. Post thoracotomy pain-resolved  6.  Left humerus fracture following a fall, status post surgical repair 10/01/2018     Disposition: Kelli [name] is in clinical remission from colon cancer and lung cancer.  She will continue colonoscopy surveillance with Dr. Jacobs.  She will return for an office visit and repeat chest CT in 1 year.   , MD  04/11/2019  11:58 AM   

## 2019-04-14 ENCOUNTER — Telehealth: Payer: Self-pay | Admitting: Oncology

## 2019-04-14 NOTE — Telephone Encounter (Signed)
Scheduled per los. Called and left msg. Mailed printout  °

## 2019-04-19 DIAGNOSIS — R69 Illness, unspecified: Secondary | ICD-10-CM | POA: Diagnosis not present

## 2019-06-16 ENCOUNTER — Other Ambulatory Visit: Payer: Self-pay

## 2019-06-16 ENCOUNTER — Ambulatory Visit (INDEPENDENT_AMBULATORY_CARE_PROVIDER_SITE_OTHER): Payer: Medicare HMO | Admitting: Osteopathic Medicine

## 2019-06-16 ENCOUNTER — Encounter: Payer: Self-pay | Admitting: Osteopathic Medicine

## 2019-06-16 VITALS — BP 123/76 | HR 80 | Temp 98.1°F | Wt 182.0 lb

## 2019-06-16 DIAGNOSIS — Z794 Long term (current) use of insulin: Secondary | ICD-10-CM | POA: Diagnosis not present

## 2019-06-16 DIAGNOSIS — E1169 Type 2 diabetes mellitus with other specified complication: Secondary | ICD-10-CM

## 2019-06-16 LAB — POCT GLYCOSYLATED HEMOGLOBIN (HGB A1C): Hemoglobin A1C: 8.5 % — AB (ref 4.0–5.6)

## 2019-06-16 NOTE — Patient Instructions (Addendum)
For Diabetes  Measure fasting blood sugar every day: goal for now is to get this to 90-150  Increase daily Insulin dose by 3 units at a time, twice per week, until fasting sugars are consistently 90-150, then continue at that dose  Plan to recheck A1C in 3 months

## 2019-06-16 NOTE — Progress Notes (Signed)
Kelli [name] is a 74 y.o. female who presents to  [place] at [facility]  today, 06/18/19, seeking care for the following:  The encounter diagnosis was Type 2 diabetes mellitus with other specified complication, with long-term current use of insulin ([place]).   Chronic, stable: [name] Diabetes type 2 - no new concerns, but frustrated she's eating well and sugars still high. A1C above goal today but not terrible.    DIABETES SCREENING/PREVENTIVE CARE: A1C past 3-6 mos: Yes    12/11/18: 9.9  03/12/19: 7.9  Today 06/16/19: 8.5 Medications:   Basaglar to titrate up to target fasting glucose   BP goal <130/80: Yes   BP Readings from Last 3 Encounters:  06/16/19 123/76  04/11/19 (!) 101/51  03/13/19 129/63    LDL goal <70: No    03/12/19: 141  Pravastatin 80 mg, higher potency statins caused myalgia  Eye exam annually: none on file, importance discussed with patient Foot exam: needs Microalbuminuria:n/a Metformin: No  ACE/ARB: No  Antiplatelet if ASCVD Risk >10%: Yes  Statin: Yes  Pneumovax: Yes    ASSESSMENT & PLAN with other pertinent history/findings:  Type 2 diabetes mellitus with other specified complication, with long-term current use of insulin ([place]) - Plan: POCT HgB A1C  Patient Instructions  For Diabetes  Measure fasting blood sugar every day: goal for now is to get this to 90-150  Increase daily Insulin dose by 3 units at a time, twice per week, until fasting sugars are consistently 90-150, then continue at that dose  Plan to recheck A1C in 3 months        Follow-up instructions: Return in about 3 months (around 09/14/2019) for recheck A1C in office - see me sooner .         BP 123/76 (BP Location: Left Arm, Patient Position: Sitting, Cuff Size: Normal)   Pulse 80   Temp 98.1 F (36.7 C) (Oral)   Wt 182 lb (82.6 kg)   LMP  (LMP Unknown)   BMI 27.67 kg/m   Current Meds  Medication Sig  .  AMBULATORY NON FORMULARY MEDICATION Single glucometer with lancets, test strips. Test daily.  Disp qs 3 months E11.9  . aspirin 81 MG tablet Take 81 mg by mouth daily.  . B Complex-C (B-COMPLEX WITH VITAMIN C) tablet Take 1 tablet by mouth daily.  . Cholecalciferol (VITAMIN D) 2000 UNITS tablet Take 2,000 Units by mouth daily.  . cloNIDine (CATAPRES) 0.1 MG tablet Take 1 tablet (0.1 mg total) by mouth at bedtime.  [name] FLUoxetine (PROZAC) 40 MG capsule Take 1 capsule (40 mg total) by mouth daily. (Patient taking differently: Take 40 mg by mouth daily with lunch. )  . gabapentin (NEURONTIN) 300 MG capsule Take 1 capsule (300 mg total) by mouth at bedtime as needed (insomnia).  . Insulin Glargine (BASAGLAR KWIKPEN) 100 UNIT/ML SOPN Inject 0.1 mLs (10 Units total) into the skin at bedtime. Increase as directed: increase by 2 units every 3 days t fasting blood sugar goal 120-150. Max dose 60 units  . Lancets (ONETOUCH DELICA PLUS [id number]) MISC TEST BLOOD SUGARS DAILY  . ONETOUCH VERIO test strip TEST BLOOD SUGARS DAILY  . pravastatin (PRAVACHOL) 80 MG tablet Take 1 tablet (80 mg total) by mouth daily. (Patient taking differently: Take 80 mg by mouth daily with lunch. )    Results for orders placed or performed in visit on 06/16/19 (from the past 72 hour(s))  POCT HgB A1C  Status: Abnormal   Collection Time: 06/16/19  3:52 PM  Result Value Ref Range   Hemoglobin A1C 8.5 (A) 4.0 - 5.6 %   HbA1c POC (<> result, manual entry)     HbA1c, POC (prediabetic range)     HbA1c, POC (controlled diabetic range)      No results found.  Depression screen [facility] 2/9 12/11/2018 06/12/2018 11/29/2017  Decreased Interest 1 2 2   Down, Depressed, Hopeless 0 0 0  PHQ - 2 Score 1 2 2   Altered sleeping 3 0 3  Tired, decreased energy 2 1 2   Change in appetite 0 0 0  Feeling bad or failure about yourself  0 0 0  Trouble concentrating 0 0 1  Moving slowly or fidgety/restless 0 0 0  Suicidal thoughts 0 0 0  PHQ-9  Score 6 3 8   Difficult doing work/chores Not difficult at all Not difficult at all Somewhat difficult  Some recent data might be hidden    GAD 7 : Generalized Anxiety Score 12/11/2018 06/12/2018 11/29/2017  Nervous, Anxious, on Edge 0 0 0  Control/stop worrying 0 0 0  Worry too much - different things 0 0 0  Trouble relaxing 0 0 0  Restless 0 0 0  Easily annoyed or irritable 1 1 1   Afraid - awful might happen 0 1 0  Total GAD 7 Score 1 2 1   Anxiety Difficulty Somewhat difficult - Somewhat difficult      All questions at time of visit were answered - patient instructed to contact office with any additional concerns or updates.  ER/RTC precautions were reviewed with the patient.  Please note: voice recognition software was used to produce this document, and typos may escape review. Please contact Dr. Sheppard Coil for any needed clarifications.

## 2019-06-25 ENCOUNTER — Other Ambulatory Visit: Payer: Self-pay | Admitting: Family Medicine

## 2019-06-26 DIAGNOSIS — R69 Illness, unspecified: Secondary | ICD-10-CM | POA: Diagnosis not present

## 2019-07-11 ENCOUNTER — Other Ambulatory Visit: Payer: Self-pay | Admitting: Osteopathic Medicine

## 2019-07-24 DIAGNOSIS — R32 Unspecified urinary incontinence: Secondary | ICD-10-CM | POA: Diagnosis not present

## 2019-07-24 DIAGNOSIS — Z794 Long term (current) use of insulin: Secondary | ICD-10-CM | POA: Diagnosis not present

## 2019-07-24 DIAGNOSIS — F419 Anxiety disorder, unspecified: Secondary | ICD-10-CM | POA: Diagnosis not present

## 2019-07-24 DIAGNOSIS — R69 Illness, unspecified: Secondary | ICD-10-CM | POA: Diagnosis not present

## 2019-07-24 DIAGNOSIS — G8929 Other chronic pain: Secondary | ICD-10-CM | POA: Diagnosis not present

## 2019-07-24 DIAGNOSIS — M2550 Pain in unspecified joint: Secondary | ICD-10-CM | POA: Diagnosis not present

## 2019-07-24 DIAGNOSIS — E785 Hyperlipidemia, unspecified: Secondary | ICD-10-CM | POA: Diagnosis not present

## 2019-07-24 DIAGNOSIS — E119 Type 2 diabetes mellitus without complications: Secondary | ICD-10-CM | POA: Diagnosis not present

## 2019-07-24 DIAGNOSIS — I10 Essential (primary) hypertension: Secondary | ICD-10-CM | POA: Diagnosis not present

## 2019-07-24 DIAGNOSIS — R269 Unspecified abnormalities of gait and mobility: Secondary | ICD-10-CM | POA: Diagnosis not present

## 2019-08-08 ENCOUNTER — Ambulatory Visit: Payer: Medicare HMO | Attending: Internal Medicine

## 2019-08-08 DIAGNOSIS — R69 Illness, unspecified: Secondary | ICD-10-CM | POA: Diagnosis not present

## 2019-08-08 DIAGNOSIS — Z23 Encounter for immunization: Secondary | ICD-10-CM | POA: Insufficient documentation

## 2019-08-08 NOTE — Progress Notes (Signed)
   Covid-19 Vaccination Clinic  Name:  Kelli [name]    MRN: [id number] DOB: Dec 26, 1945  08/08/2019  Kelli [name] was observed post Covid-19 immunization for 15 minutes without incident. She was provided with Vaccine Information Sheet and instruction to access the V-Safe system.   Kelli [name] was instructed to call 911 with any severe reactions post vaccine: [name] Difficulty breathing  . Swelling of face and throat  . A fast heartbeat  . A bad rash all over body  . Dizziness and weakness

## 2019-08-14 ENCOUNTER — Ambulatory Visit: Payer: Self-pay

## 2019-08-14 ENCOUNTER — Other Ambulatory Visit: Payer: Self-pay

## 2019-08-14 ENCOUNTER — Encounter: Payer: Self-pay | Admitting: Family Medicine

## 2019-08-14 ENCOUNTER — Ambulatory Visit: Payer: Medicare HMO | Admitting: Family Medicine

## 2019-08-14 VITALS — BP 130/80 | HR 80 | Ht 68.0 in | Wt 184.0 lb

## 2019-08-14 DIAGNOSIS — M25511 Pain in right shoulder: Secondary | ICD-10-CM

## 2019-08-14 NOTE — Progress Notes (Signed)
   Rito Ehrlich, am serving as a Education administrator for Dr. [name].  Kelli [name] is a 74 y.o. female who presents to [place] at [facility] today for f/u R shoulder and arm pain.  She was last seen by Dr. [name] on 03/07/19 and had a R subacromial bursal injection.  She has been shown a HEP in the past by Dr. [name].  Since her last visit, pt reports that she has been having R shoulder pain that radiates down her arm slightly and is having a hard time lifting her arms up. States that the shoulder injections help a lot and last for a couple months.   Patient notes that her left shoulder is hurting in a similar fashion and is interested in left-sided injection as well.  Pertinent review of systems: No fevers or chills  Relevant historical information: Diabetes.  Blood sugars typically 160 or so fasting.   Exam:  BP 130/80 (BP Location: Left Arm, Patient Position: Sitting, Cuff Size: Normal)   Pulse 80   Ht 5\' 8"  (1.727 m)   Wt 184 lb (83.5 kg)   LMP  (LMP Unknown)   SpO2 95%   BMI 27.98 kg/m  General: Well Developed, well nourished, and in no acute distress.   MSK: Right shoulder normal-appearing nontender normal motion pain with abduction.  Intact strength.    Lab and Radiology Results  Procedure: Real-time Ultrasound Guided Injection of right subacromial bursa Device: Philips Affiniti 50G Images permanently stored and available for review in the ultrasound unit. Verbal informed consent obtained.  Discussed risks and benefits of procedure. Warned about infection bleeding damage to structures skin hypopigmentation and fat atrophy among others. Patient expresses understanding and agreement Time-out conducted.   Noted no overlying erythema, induration, or other signs of local infection.   Skin prepped in a sterile fashion.   Local anesthesia: Topical Ethyl chloride.  Examination of structures under ultrasound show increased partial thickness consistent with  bursitis.  With sterile technique and under real time ultrasound guidance:  40 mg Kenalog and 2 mL Marcaine injected easily.   Completed without difficulty   Pain immediately resolved suggesting accurate placement of the medication.   Advised to call if fevers/chills, erythema, induration, drainage, or persistent bleeding.   Images permanently stored and available for review in the ultrasound unit.  Impression: Technically successful ultrasound guided injection.      Assessment and Plan: 74 y.o. female with right shoulder pain subacromial bursitis.  Last injection was about a half a year ago.  Patient is done well until recently.  Plan for repeat injection today along with continued home exercise program.  Recheck in 1 week for dedicated evaluation of left shoulder pain and probable injection.   PDMP not reviewed this encounter. Orders Placed This Encounter  Procedures  . [place] LIMITED JOINT SPACE STRUCTURES UP RIGHT(NO LINKED CHARGES)    Order Specific Question:   Reason for Exam (SYMPTOM  OR DIAGNOSIS REQUIRED)    Answer:   rt shouler sub ac inj    Order Specific Question:   Preferred imaging location?    Answer:   [place]   No orders of the defined types were placed in this encounter.    Discussed warning signs or symptoms. Please see discharge instructions. Patient expresses understanding.   The above documentation has been reviewed and is accurate and complete [name]

## 2019-08-14 NOTE — Patient Instructions (Signed)
Thank you for coming in today. Call or go to the ER if you develop a large red swollen joint with extreme pain or oozing puss.  Your blood sugar may increase with the shot.  Recheck as early as 1 week for left sided injection.  Return or contact me sooner if needed.

## 2019-08-21 ENCOUNTER — Other Ambulatory Visit: Payer: Self-pay

## 2019-08-21 ENCOUNTER — Encounter: Payer: Self-pay | Admitting: Family Medicine

## 2019-08-21 ENCOUNTER — Ambulatory Visit (INDEPENDENT_AMBULATORY_CARE_PROVIDER_SITE_OTHER): Payer: Medicare HMO | Admitting: Family Medicine

## 2019-08-21 VITALS — BP 120/60 | HR 72 | Ht 68.0 in | Wt 181.2 lb

## 2019-08-21 DIAGNOSIS — M25512 Pain in left shoulder: Secondary | ICD-10-CM

## 2019-08-21 NOTE — Patient Instructions (Signed)
Thank you for coming in today. Continue home exercises.  Recheck sooner if needed.

## 2019-08-21 NOTE — Progress Notes (Signed)
   I, Kelli [name], LAT, ATC, am serving as scribe for Dr. [name].  Kelli [name] is a 74 y.o. female who presents to [place] at [facility] today for f/u of B shoulder pain.  She was last seen by Dr. [name] on 08/14/19 and had a R subacromial injection.  Since her last visit, pt reports that her R shoulder dramatically improved after her R shoulder injection.  She also reports that her L shoulder pain has resolved after having her R shoulder injected. She feels great.   Pertinent review of systems: No fevers or chills  Relevant historical information: History left shoulder surgery following fracture. Persistent lack of range of motion   Exam:  BP 120/60 (BP Location: Right Arm, Patient Position: Sitting, Cuff Size: Normal)   Pulse 72   Ht 5\' 8"  (1.727 m)   Wt 181 lb 3.2 oz (82.2 kg)   LMP  (LMP Unknown)   SpO2 98%   BMI 27.55 kg/m  General: Well Developed, well nourished, and in no acute distress.   MSK: Left shoulder normal-appearing Decreased abduction otherwise rotation range of motion normal. Intact strength. Negative Hawkins and Neer's test.       Assessment and Plan: 74 y.o. female with resolved left shoulder pain. Patient had right shoulder injection a week ago and the systemic component of the steroid seems to have worked quite well. Plan to continue home exercise program and recheck as needed. But the patient myself agree there is no need for injection if she is not hurting.     Discussed warning signs or symptoms. Please see discharge instructions. Patient expresses understanding.   The above documentation has been reviewed and is accurate and complete [name]

## 2019-09-03 ENCOUNTER — Ambulatory Visit: Payer: Medicare HMO | Attending: Internal Medicine

## 2019-09-03 DIAGNOSIS — Z23 Encounter for immunization: Secondary | ICD-10-CM

## 2019-09-03 NOTE — Progress Notes (Signed)
   Covid-19 Vaccination Clinic  Name:  Kelli [name]    MRN: [id number] DOB: [birth year]  09/03/2019  [name] was observed post Covid-19 immunization for 15 minutes without incident. She was provided with Vaccine Information Sheet and instruction to access the V-Safe system.   [name] was instructed to call 911 with any severe reactions post vaccine: [name] Difficulty breathing  . Swelling of face and throat  . A fast heartbeat  . A bad rash all over body  . Dizziness and weakness   Immunizations Administered    Name Date Dose VIS Date Route   Pfizer COVID-19 Vaccine 09/03/2019  1:22 PM 0.3 mL 05/16/2019 Intramuscular   Manufacturer: [employer], [employer]   Lot: [id number]   [place]: [id number]

## 2019-09-16 ENCOUNTER — Encounter: Payer: Self-pay | Admitting: Osteopathic Medicine

## 2019-09-16 ENCOUNTER — Ambulatory Visit (INDEPENDENT_AMBULATORY_CARE_PROVIDER_SITE_OTHER): Payer: Medicare HMO | Admitting: Osteopathic Medicine

## 2019-09-16 ENCOUNTER — Other Ambulatory Visit: Payer: Self-pay

## 2019-09-16 VITALS — BP 117/61 | HR 70 | Wt 186.0 lb

## 2019-09-16 DIAGNOSIS — F418 Other specified anxiety disorders: Secondary | ICD-10-CM

## 2019-09-16 DIAGNOSIS — E1169 Type 2 diabetes mellitus with other specified complication: Secondary | ICD-10-CM

## 2019-09-16 DIAGNOSIS — Z85118 Personal history of other malignant neoplasm of bronchus and lung: Secondary | ICD-10-CM | POA: Diagnosis not present

## 2019-09-16 DIAGNOSIS — L729 Follicular cyst of the skin and subcutaneous tissue, unspecified: Secondary | ICD-10-CM

## 2019-09-16 DIAGNOSIS — Z85038 Personal history of other malignant neoplasm of large intestine: Secondary | ICD-10-CM | POA: Diagnosis not present

## 2019-09-16 DIAGNOSIS — R69 Illness, unspecified: Secondary | ICD-10-CM | POA: Diagnosis not present

## 2019-09-16 DIAGNOSIS — Z1231 Encounter for screening mammogram for malignant neoplasm of breast: Secondary | ICD-10-CM

## 2019-09-16 DIAGNOSIS — Z780 Asymptomatic menopausal state: Secondary | ICD-10-CM

## 2019-09-16 DIAGNOSIS — Z794 Long term (current) use of insulin: Secondary | ICD-10-CM

## 2019-09-16 LAB — POCT GLYCOSYLATED HEMOGLOBIN (HGB A1C): Hemoglobin A1C: 8.1 % — AB (ref 4.0–5.6)

## 2019-09-16 MED ORDER — PRAVASTATIN SODIUM 80 MG PO TABS: 80.0000 mg | ORAL_TABLET | Freq: Every day | ORAL | 3 refills | Status: DC

## 2019-09-16 MED ORDER — FLUOXETINE HCL 40 MG PO CAPS: 40.0000 mg | ORAL_CAPSULE | Freq: Every day | ORAL | 3 refills | Status: DC

## 2019-09-16 MED ORDER — CLONIDINE HCL 0.1 MG PO TABS: 0.1000 mg | ORAL_TABLET | Freq: Every day | ORAL | 3 refills | Status: DC

## 2019-09-16 NOTE — Progress Notes (Signed)
Kelli [name] is a 74 y.o. female who presents to  [place] at [facility]  today, 09/16/19, seeking care for the following: . DM2 . Skin problem - cyst or something on face for a few weeks, sore, not bothering otherwise, would like this lanced, would like to avoid injected lidocaine if possible       ASSESSMENT & PLAN with other pertinent history/findings:  The primary encounter diagnosis was Type 2 diabetes mellitus with other specified complication, with long-term current use of insulin ([place]). Diagnoses of Mucous retention skin cyst, Depression with anxiety, Encounter for screening mammogram for malignant neoplasm of breast, Postmenopausal, H/O colon cancer, stage II, and History of lung cancer were also pertinent to this visit.    Meds refilled  Cold spray applied to nodule below R eye, small incision w/ scalpel, tissue is thick, no pus/cystic material expressed, I advised pt to come back for excision procedure, sorry but we will have to do lidocaine if we want this thing off! Appt TBD, pt will call to schedule (OV40 pre-lunch or late PM, ok to open an 11:30 slot if needed)   DM2 under decent control, continue current meds     DIABETES SCREENING/PREVENTIVE CARE: A1C past 3-6 mos: Yes    12/11/18: 9.9  03/12/19: 7.9  06/16/19: 8.5 --> titrate up on insulin   Today 09/16/19: 8.1  Medications:   Basaglar to titrate up to target fasting glucose   BP goal <130/80: Yes   BP Readings from Last 3 Encounters:  09/16/19 117/61  08/21/19 120/60  08/14/19 130/80   LDL goal <70: No    03/12/19: 141  Pravastatin 80 mg, higher potency statins caused myalgia  Eye exam annually: none on file, importance discussed with patient Foot exam: needs Microalbuminuria: routine Metformin: No - intolerant  ACE/ARB: No - BP has been good  Antiplatelet if ASCVD Risk >10%: Yes  Statin: Yes  Pneumovax: Yes    .     There are no  Patient Instructions on file for this visit.   Orders Placed This Encounter  Procedures  . POCT HgB A1C    No orders of the defined types were placed in this encounter.   Past Surgical History:  Procedure Laterality Date  . APPENDECTOMY    . BREAST SURGERY  1978   augmentation  . COLON SURGERY     2010  . CRYO INTERCOSTAL NERVE BLOCK N/A 10/05/2014   Procedure: CRYO INTERCOSTAL NERVE BLOCK;  Surgeon: [name], MD;  Location: [place];  Service: Thoracic;  Laterality: N/A;  . LYMPH NODE DISSECTION Right 10/05/2014   Procedure: LYMPH NODE DISSECTION;  Surgeon: [name], MD;  Location: [place];  Service: Thoracic;  Laterality: Right;  . ORIF HUMERUS FRACTURE Left 10/01/2018   Procedure: OPEN REDUCTION INTERNAL FIXATION (ORIF) LEFT PROXIMAL HUMERUS FRACTURE;  Surgeon: [name], MD;  Location: [place];  Service: Orthopedics;  Laterality: Left;  . PARTIAL COLECTOMY Right 2010  . SEGMENTECOMY Right 10/05/2014   Procedure: right lower lobe SEGMENTECTOMY;  Surgeon: [name], MD;  Location: [place];  Service: Thoracic;  Laterality: Right;  [name] VIDEO ASSISTED THORACOSCOPY (VATS)/WEDGE RESECTION Right 10/05/2014   Procedure: Right VIDEO ASSISTED THORACOSCOPY (VATS) with right lower lobe lung nodule WEDGE RESECTION;  Surgeon: [name], MD;  Location: [place];  Service: Thoracic;  Laterality: Right;      Follow-up instructions: No follow-ups on file.  BP 117/61 (BP Location: Right Arm, Patient Position: Sitting, Cuff Size: Normal)   Pulse 70   Wt 186 lb (84.4 kg)   LMP  (LMP Unknown)   SpO2 98%   BMI 28.28 kg/m   Current Meds  Medication Sig  . AMBULATORY NON FORMULARY MEDICATION Single glucometer with lancets, test strips. Test daily.  Disp qs 3 months E11.9  . aspirin 81 MG tablet Take 81 mg by mouth daily.  . B Complex-C (B-COMPLEX WITH VITAMIN C) tablet Take 1  tablet by mouth daily.  . B-D UF III MINI PEN NEEDLES 31G X 5 MM MISC See admin instructions.  . Cholecalciferol (VITAMIN D) 2000 UNITS tablet Take 2,000 Units by mouth daily.  . cloNIDine (CATAPRES) 0.1 MG tablet Take 1 tablet (0.1 mg total) by mouth at bedtime.  [name] FLUoxetine (PROZAC) 40 MG capsule Take 1 capsule (40 mg total) by mouth daily. (Patient taking differently: Take 40 mg by mouth daily with lunch. )  . gabapentin (NEURONTIN) 300 MG capsule Take 1 capsule (300 mg total) by mouth at bedtime as needed (insomnia).  . Insulin Glargine (BASAGLAR KWIKPEN) 100 UNIT/ML SOPN Inject 0.1 mLs (10 Units total) into the skin at bedtime. Increase as directed: increase by 2 units every 3 days t fasting blood sugar goal 120-150. Max dose 60 units  . OneTouch Delica Lancets 71I MISC TEST BLOOD SUGARS DAILY  . ONETOUCH VERIO test strip TEST BLOOD SUGARS DAILY  . pravastatin (PRAVACHOL) 80 MG tablet Take 1 tablet (80 mg total) by mouth daily. (Patient taking differently: Take 80 mg by mouth daily with lunch. )    No results found for this or any previous visit (from the past 72 hour(s)).  No results found.  Depression screen [facility] 2/9 12/11/2018 06/12/2018 11/29/2017  Decreased Interest 1 2 2   Down, Depressed, Hopeless 0 0 0  PHQ - 2 Score 1 2 2   Altered sleeping 3 0 3  Tired, decreased energy 2 1 2   Change in appetite 0 0 0  Feeling bad or failure about yourself  0 0 0  Trouble concentrating 0 0 1  Moving slowly or fidgety/restless 0 0 0  Suicidal thoughts 0 0 0  PHQ-9 Score 6 3 8   Difficult doing work/chores Not difficult at all Not difficult at all Somewhat difficult  Some recent data might be hidden    GAD 7 : Generalized Anxiety Score 12/11/2018 06/12/2018 11/29/2017  Nervous, Anxious, on Edge 0 0 0  Control/stop worrying 0 0 0  Worry too much - different things 0 0 0  Trouble relaxing 0 0 0  Restless 0 0 0  Easily annoyed or irritable 1 1 1   Afraid - awful might happen 0 1 0  Total GAD 7  Score 1 2 1   Anxiety Difficulty Somewhat difficult - Somewhat difficult      All questions at time of visit were answered - patient instructed to contact office with any additional concerns or updates.  ER/RTC precautions were reviewed with the patient.  Please note: voice recognition software was used to produce this document, and typos may escape review. Please contact Dr. Sheppard Coil for any needed clarifications.

## 2019-09-26 ENCOUNTER — Encounter: Payer: Self-pay | Admitting: Osteopathic Medicine

## 2019-09-26 ENCOUNTER — Other Ambulatory Visit: Payer: Self-pay

## 2019-09-26 ENCOUNTER — Ambulatory Visit (INDEPENDENT_AMBULATORY_CARE_PROVIDER_SITE_OTHER): Payer: Medicare HMO | Admitting: Osteopathic Medicine

## 2019-09-26 ENCOUNTER — Other Ambulatory Visit: Payer: Self-pay | Admitting: Osteopathic Medicine

## 2019-09-26 VITALS — BP 114/71 | HR 74 | Temp 98.1°F | Wt 185.0 lb

## 2019-09-26 DIAGNOSIS — L989 Disorder of the skin and subcutaneous tissue, unspecified: Secondary | ICD-10-CM | POA: Diagnosis not present

## 2019-09-26 DIAGNOSIS — C44329 Squamous cell carcinoma of skin of other parts of face: Secondary | ICD-10-CM | POA: Diagnosis not present

## 2019-09-26 MED ORDER — CEPHALEXIN 500 MG PO CAPS: 500.0000 mg | ORAL_CAPSULE | Freq: Two times a day (BID) | ORAL | 0 refills | Status: AC

## 2019-09-26 NOTE — Progress Notes (Signed)
  PRE-OP DIAGNOSIS: Abnormal Skin Lesion - suspect neoplasm vs calcified cyst vs other  POST-OP DIAGNOSIS: Same  PROCEDURE: excisional skin biopsy Performing Physician: Emeterio Reeve   PROCEDURE:  Excisional Biopsy    The area surrounding the skin lesion was prepared and draped in the usual sterile manner. Elliptical incision was made surrounding the lesion, following skin lines. The lesion was removed in the usual manner by the biopsy method noted above. Hemostasis was assured. The patient tolerated the procedure well.   Closure:      Dissolvable suture subq x2 Simple interrupted for skin closure X5 Dermabond     Followup: The patient tolerated the procedure well without complications.  Standard post-procedure care is explained and return precautions are given.

## 2019-10-02 ENCOUNTER — Ambulatory Visit (INDEPENDENT_AMBULATORY_CARE_PROVIDER_SITE_OTHER): Payer: Medicare HMO | Admitting: Nurse Practitioner

## 2019-10-02 ENCOUNTER — Encounter: Payer: Self-pay | Admitting: Nurse Practitioner

## 2019-10-02 VITALS — BP 109/70 | HR 80 | Temp 98.2°F | Ht 68.0 in | Wt 186.0 lb

## 2019-10-02 DIAGNOSIS — Z4802 Encounter for removal of sutures: Secondary | ICD-10-CM

## 2019-10-02 NOTE — Progress Notes (Signed)
Established Patient Office Visit  Subjective:  Patient ID: Kelli [name], female    DOB: 1946-05-27  Age: 74 y.o. MRN: [id number]  CC:  Chief Complaint  Patient presents with  . Facial Laceration    evaluate and remove stitches    HPI Kelli [name] presents for evaluation and removal of stitches on her right cheek from removal of skin lesion approximately 6 days ago. She denies redness, inflammation, or discharge from the area.   Past Medical History:  Diagnosis Date  . Anxiety   . Arthritis   . Borderline diabetic   . Colon cancer ([place])    colon ca dx 02/2009, lung cancer  . Depression   . Depression with anxiety 09/19/2011  . H/O colon cancer, stage II 09/19/2011   T3N0  Cecum Sept 2010  . High cholesterol   . Hyperlipidemia 09/19/2011  . Lung nodules 09/19/2011   granulomas  . Urinary urgency     Past Surgical History:  Procedure Laterality Date  . APPENDECTOMY    . BREAST SURGERY  1978   augmentation  . COLON SURGERY     2010  . CRYO INTERCOSTAL NERVE BLOCK N/A 10/05/2014   Procedure: CRYO INTERCOSTAL NERVE BLOCK;  Surgeon: [name], MD;  Location: [place];  Service: Thoracic;  Laterality: N/A;  . LYMPH NODE DISSECTION Right 10/05/2014   Procedure: LYMPH NODE DISSECTION;  Surgeon: [name], MD;  Location: [place];  Service: Thoracic;  Laterality: Right;  . ORIF HUMERUS FRACTURE Left 10/01/2018   Procedure: OPEN REDUCTION INTERNAL FIXATION (ORIF) LEFT PROXIMAL HUMERUS FRACTURE;  Surgeon: [name], MD;  Location: [place];  Service: Orthopedics;  Laterality: Left;  . PARTIAL COLECTOMY Right 2010  . SEGMENTECOMY Right 10/05/2014   Procedure: right lower lobe SEGMENTECTOMY;  Surgeon: [name], MD;  Location: [place];  Service: Thoracic;  Laterality: Right;  [name] VIDEO ASSISTED THORACOSCOPY (VATS)/WEDGE RESECTION Right 10/05/2014   Procedure: Right VIDEO ASSISTED THORACOSCOPY (VATS) with right lower lobe lung nodule WEDGE RESECTION;  Surgeon:  [name], MD;  Location: [place];  Service: Thoracic;  Laterality: Right;    Family History  Problem Relation Age of Onset  . Heart disease Mother   . Heart disease Brother     Social History   Socioeconomic History  . Marital status: Divorced    Spouse name: Not on file  . Number of children: Not on file  . Years of education: Not on file  . Highest education level: Not on file  Occupational History  . Not on file  Tobacco Use  . Smoking status: Former Smoker    Packs/day: 3.00    Years: 40.00    Pack years: 120.00    Types: Cigarettes    Quit date: 02/06/2009    Years since quitting: 10.6  . Smokeless tobacco: Never Used  Substance and Sexual Activity  . Alcohol use: Yes    Alcohol/week: 0.0 standard drinks    Comment: 3-4 times a week 1-2 beers  . Drug use: No  . Sexual activity: Never    Birth control/protection: Post-menopausal  Other Topics Concern  . Not on file  Social History Narrative  . Not on file   Social Determinants of Health   Financial Resource Strain:   . Difficulty of Paying Living Expenses:   Food Insecurity:   . Worried About Charity fundraiser in the Last Year:   . [place] in the Last Year:  Transportation Needs:   . Film/video editor (Medical):   [name] Lack of Transportation (Non-Medical):   Physical Activity:   . Days of Exercise per Week:   . Minutes of Exercise per Session:   Stress:   . Feeling of Stress :   Social Connections:   . Frequency of Communication with Friends and Family:   . Frequency of Social Gatherings with Friends and Family:   . Attends Religious Services:   . Active Member of Clubs or Organizations:   . Attends Archivist Meetings:   [name] Marital Status:   Intimate Partner Violence:   . Fear of Current or Ex-Partner:   . Emotionally Abused:   [name] Physically Abused:   . Sexually Abused:     Outpatient Medications Prior to Visit  Medication Sig Dispense Refill  . AMBULATORY NON  FORMULARY MEDICATION Single glucometer with lancets, test strips. Test daily.  Disp qs 3 months E11.9 1 each 0  . aspirin 81 MG tablet Take 81 mg by mouth daily.    . B Complex-C (B-COMPLEX WITH VITAMIN C) tablet Take 1 tablet by mouth daily.    . B-D UF III MINI PEN NEEDLES 31G X 5 MM MISC See admin instructions.    . Cholecalciferol (VITAMIN D) 2000 UNITS tablet Take 2,000 Units by mouth daily.    . cloNIDine (CATAPRES) 0.1 MG tablet Take 1 tablet (0.1 mg total) by mouth at bedtime. 90 tablet 3  . FLUoxetine (PROZAC) 40 MG capsule Take 1 capsule (40 mg total) by mouth daily. 90 capsule 3  . gabapentin (NEURONTIN) 300 MG capsule Take 1 capsule (300 mg total) by mouth at bedtime as needed (insomnia). 90 capsule 3  . Insulin Glargine (BASAGLAR KWIKPEN) 100 UNIT/ML SOPN Inject 0.1 mLs (10 Units total) into the skin at bedtime. Increase as directed: increase by 2 units every 3 days t fasting blood sugar goal 120-150. Max dose 60 units 5 pen 11  . OneTouch Delica Lancets 00F MISC TEST BLOOD SUGARS DAILY 100 each 0  . ONETOUCH VERIO test strip TEST BLOOD SUGARS DAILY 100 strip 1  . pravastatin (PRAVACHOL) 80 MG tablet Take 1 tablet (80 mg total) by mouth daily. 90 tablet 3   No facility-administered medications prior to visit.    Allergies  Allergen Reactions  . Atorvastatin Other (See Comments)    Muscle cramps  . Glipizide Er Other (See Comments)    Shaking / tingling in fingers     ROS Review of Systems  Constitutional: Negative for chills, fatigue and fever.  HENT: Negative for facial swelling.   Skin: Negative for color change, pallor, rash and wound.  Neurological: Negative for headaches.      Objective:    Physical Exam  Constitutional: She appears well-developed and well-nourished.  HENT:  Head: Normocephalic.  Eyes: Pupils are equal, round, and reactive to light. Conjunctivae and EOM are normal.  Cardiovascular: Normal rate.  Pulmonary/Chest: Effort normal.   Musculoskeletal:        General: Normal range of motion.     Cervical back: Normal range of motion.  Neurological: She is alert.  Skin: Skin is warm and dry.  Psychiatric: She has a normal mood and affect. Her behavior is normal. Judgment and thought content normal.    BP 109/70   Pulse 80   Temp 98.2 F (36.8 C) (Oral)   Ht 5' 8"  (1.727 m)   Wt 186 lb (84.4 kg)   LMP  (LMP Unknown)  SpO2 96%   BMI 28.28 kg/m  Wt Readings from Last 3 Encounters:  10/02/19 186 lb (84.4 kg)  09/26/19 185 lb (83.9 kg)  09/16/19 186 lb (84.4 kg)     Health Maintenance Due  Topic Date Due  . FOOT EXAM  Never done  . OPHTHALMOLOGY EXAM  Never done  . MAMMOGRAM  06/05/2012  . DEXA SCAN  01/20/2013  . PNA vac Low Risk Adult (2 of 2 - PPSV23) 08/22/2017  . COLONOSCOPY  07/22/2019    There are no preventive care reminders to display for this patient.  Lab Results  Component Value Date   TSH 3.63 03/12/2019   Lab Results  Component Value Date   WBC 9.1 03/12/2019   HGB 12.3 03/12/2019   HCT 37.0 03/12/2019   MCV 93.0 03/12/2019   PLT 333 03/12/2019   Lab Results  Component Value Date   NA 140 03/12/2019   K 4.4 03/12/2019   CHLORIDE 106 03/20/2017   CO2 27 03/12/2019   GLUCOSE 176 (H) 03/12/2019   BUN 18 03/12/2019   CREATININE 0.81 03/12/2019   BILITOT 0.3 03/12/2019   ALKPHOS 90 10/31/2016   AST 13 03/12/2019   ALT 16 03/12/2019   PROT 6.7 03/12/2019   ALBUMIN 4.0 10/31/2016   CALCIUM 9.5 03/12/2019   ANIONGAP 12 10/01/2018   EGFR >60 03/20/2017   Lab Results  Component Value Date   CHOL 221 (H) 03/12/2019   Lab Results  Component Value Date   HDL 53 03/12/2019   Lab Results  Component Value Date   LDLCALC 141 (H) 03/12/2019   Lab Results  Component Value Date   TRIG 143 03/12/2019   Lab Results  Component Value Date   CHOLHDL 4.2 03/12/2019   Lab Results  Component Value Date   HGBA1C 8.1 (A) 09/16/2019      Assessment & Plan:   1. Encounter  for removal of sutures Five sutures removed. Wound edges well approximated and healing well.  Patient provided with instructions to keep area cleansed with gentle cleanser and pat dry.  Follow-up if area becomes red, inflamed, discharge is present, or area opens.   Orma Render, NP

## 2019-10-30 ENCOUNTER — Encounter: Payer: Self-pay | Admitting: Osteopathic Medicine

## 2019-10-31 NOTE — Telephone Encounter (Signed)
Appt was scheduled.

## 2019-11-04 ENCOUNTER — Ambulatory Visit (INDEPENDENT_AMBULATORY_CARE_PROVIDER_SITE_OTHER): Payer: Medicare HMO | Admitting: Osteopathic Medicine

## 2019-11-04 ENCOUNTER — Encounter: Payer: Self-pay | Admitting: Osteopathic Medicine

## 2019-11-04 VITALS — BP 112/72 | HR 72 | Temp 98.0°F | Wt 183.0 lb

## 2019-11-04 DIAGNOSIS — L729 Follicular cyst of the skin and subcutaneous tissue, unspecified: Secondary | ICD-10-CM | POA: Diagnosis not present

## 2019-11-06 ENCOUNTER — Encounter: Payer: Self-pay | Admitting: Osteopathic Medicine

## 2019-11-06 ENCOUNTER — Ambulatory Visit (INDEPENDENT_AMBULATORY_CARE_PROVIDER_SITE_OTHER): Payer: Medicare HMO | Admitting: Osteopathic Medicine

## 2019-11-06 DIAGNOSIS — L0291 Cutaneous abscess, unspecified: Secondary | ICD-10-CM

## 2019-11-06 NOTE — Progress Notes (Signed)
Kelli [name] is a 74 y.o. female who presents to  [facility]  today, 11/04/19, seeking care for the following: . Skin concern - spot on face healing well, she has a lump on upper R back she's like looked at      [place] with other pertinent history/findings:  The encounter diagnosis was Subcutaneous cyst.  Will schedule removal       Follow-up instructions: Return for 3:20 slot on 11/06/19.                                         BP 112/72 (BP Location: Right Arm, Patient Position: Sitting)   Pulse 72   Temp 98 F (36.7 C)   Wt 183 lb (83 kg)   LMP  (LMP Unknown)   BMI 27.83 kg/m   Current Meds  Medication Sig  . AMBULATORY NON FORMULARY MEDICATION Single glucometer with lancets, test strips. Test daily.  Disp qs 3 months E11.9  . aspirin 81 MG tablet Take 81 mg by mouth daily.  . B Complex-C (B-COMPLEX WITH VITAMIN C) tablet Take 1 tablet by mouth daily.  . B-D UF III MINI PEN NEEDLES 31G X 5 MM MISC See admin instructions.  . Cholecalciferol (VITAMIN D) 2000 UNITS tablet Take 2,000 Units by mouth daily.  . cloNIDine (CATAPRES) 0.1 MG tablet Take 1 tablet (0.1 mg total) by mouth at bedtime.  [name] FLUoxetine (PROZAC) 40 MG capsule Take 1 capsule (40 mg total) by mouth daily.  [name] gabapentin (NEURONTIN) 300 MG capsule Take 1 capsule (300 mg total) by mouth at bedtime as needed (insomnia).  . Insulin Glargine (BASAGLAR KWIKPEN) 100 UNIT/ML SOPN Inject 0.1 mLs (10 Units total) into the skin at bedtime. Increase as directed: increase by 2 units every 3 days t fasting blood sugar goal 120-150. Max dose 60 units  . OneTouch Delica Lancets 24P MISC TEST BLOOD SUGARS DAILY  . ONETOUCH VERIO test strip TEST BLOOD SUGARS DAILY  . pravastatin (PRAVACHOL) 80 MG tablet Take 1 tablet (80 mg total) by mouth daily.    No results found for this or any previous visit (from the past 72  hour(s)).  No results found.  Depression screen [facility] 2/9 12/11/2018 06/12/2018 11/29/2017  Decreased Interest 1 2 2   Down, Depressed, Hopeless 0 0 0  PHQ - 2 Score 1 2 2   Altered sleeping 3 0 3  Tired, decreased energy 2 1 2   Change in appetite 0 0 0  Feeling bad or failure about yourself  0 0 0  Trouble concentrating 0 0 1  Moving slowly or fidgety/restless 0 0 0  Suicidal thoughts 0 0 0  PHQ-9 Score 6 3 8   Difficult doing work/chores Not difficult at all Not difficult at all Somewhat difficult  Some recent data might be hidden    GAD 7 : Generalized Anxiety Score 12/11/2018 06/12/2018 11/29/2017  Nervous, Anxious, on Edge 0 0 0  Control/stop worrying 0 0 0  Worry too much - different things 0 0 0  Trouble relaxing 0 0 0  Restless 0 0 0  Easily annoyed or irritable 1 1 1   Afraid - awful might happen 0 1 0  Total GAD 7 Score 1 2 1   Anxiety Difficulty Somewhat difficult - Somewhat difficult      All questions at time of visit  were answered - patient instructed to contact office with any additional concerns or updates.  ER/RTC precautions were reviewed with the patient.  Please note: voice recognition software was used to produce this document, and typos may escape review. Please contact Dr. Sheppard Coil for any needed clarifications.   Total encounter time: 15 minutes.

## 2019-11-07 NOTE — Progress Notes (Signed)
Patient seen a few days ago for somewhat tender subcutaneous lump, on palpation difficult to tell if cyst versus lipoma, had her schedule II days for removal.  She reports last night/this morning she noticed some drainage from the area, feels a bit more tender and red now.  Looks more like deep abscess, incision and drainage successful as below.  PRE-OP DIAGNOSIS: Abscess of R upper back  POST-OP DIAGNOSIS: Same  PROCEDURE: incision and drainage of abscess Performing Physician: Sheppard Coil    PROCEDURE:   Patient informed consent obtained verbally after discussion of risks (including but not limited to pain, infection, bleeding, damage to surrounding tissues, incomplete evacuation/treatment of infection, recurrence)  and benefits (adequate treatment and diagnosis, relief of pain). All questions answered prior to procedure.   A timeout protocol was performed prior to initiating the procedure.  The area was prepared and draped in the usual, sterile manner. The site was anesthetized with 4 cc 1% lidocaine with epinephrine. A linear incision along the local skin lines was made and the purulent material expressed. The abcess was explored thoroughly and sequestered pockets were evacuated. Bleeding was minimal.   Packing: none  Given the relatively deep nature of the abscess, and location on the skin, I did place 2 simple interrupted sutures with 0 Prolene, relatively loosely to allow for potential additional drainage, patient to follow-up in 8 days for suture removal, sooner if needed   Followup: The patient tolerated the procedure well without complications. Standard post-procedure care is explained and return precautions are given, patient was provided with printed information & instructions.

## 2019-11-08 DIAGNOSIS — R69 Illness, unspecified: Secondary | ICD-10-CM | POA: Diagnosis not present

## 2019-11-14 ENCOUNTER — Encounter: Payer: Self-pay | Admitting: Medical-Surgical

## 2019-11-14 ENCOUNTER — Ambulatory Visit (INDEPENDENT_AMBULATORY_CARE_PROVIDER_SITE_OTHER): Payer: Medicare HMO | Admitting: Medical-Surgical

## 2019-11-14 VITALS — BP 102/69 | HR 73 | Temp 97.8°F | Ht 68.0 in | Wt 183.6 lb

## 2019-11-14 DIAGNOSIS — L0291 Cutaneous abscess, unspecified: Secondary | ICD-10-CM

## 2019-11-14 MED ORDER — DOXYCYCLINE HYCLATE 100 MG PO TABS: 100.0000 mg | ORAL_TABLET | Freq: Two times a day (BID) | ORAL | 0 refills | Status: DC

## 2019-11-14 NOTE — Progress Notes (Signed)
Subjective:    CC: Wound check  HPI: Very pleasant 74 year old female presenting today for a wound check on the abscess that was drained by Dr. Sheppard Coil on 11/06/2019.  She reports that the pain around the wound has persisted since having her I&D done.  She has also experienced intermittent bleeding and seepage of fluid from the incision.  She has been using Neosporin topically and keeping the incision covered with Band-Aids.  Pain is worse with movement of her head, neck, and shoulder.  Rates pain at 7/10 at its worst.  Has tried taking over-the-counter pain medications without relief.  Has not tried heat or ice.  Denies fever, chills, and myalgias.  I reviewed the past medical history, family history, social history, surgical history, and allergies today and no changes were needed.  Please see the problem list section below in epic for further details.  Past Medical History: Past Medical History:  Diagnosis Date   Anxiety    Arthritis    Borderline diabetic    Colon cancer ([place])    colon ca dx 02/2009, lung cancer   Depression    Depression with anxiety 09/19/2011   H/O colon cancer, stage II 09/19/2011   T3N0  Cecum Sept 2010   High cholesterol    Hyperlipidemia 09/19/2011   Lung nodules 09/19/2011   granulomas   Urinary urgency    Past Surgical History: Past Surgical History:  Procedure Laterality Date   APPENDECTOMY     BREAST SURGERY  1978   augmentation   COLON SURGERY     2010   CRYO INTERCOSTAL NERVE BLOCK N/A 10/05/2014   Procedure: CRYO INTERCOSTAL NERVE BLOCK;  Surgeon: [name], MD;  Location: [place];  Service: Thoracic;  Laterality: N/A;   LYMPH NODE DISSECTION Right 10/05/2014   Procedure: LYMPH NODE DISSECTION;  Surgeon: [name], MD;  Location: [place];  Service: Thoracic;  Laterality: Right;   ORIF HUMERUS FRACTURE Left 10/01/2018   Procedure: OPEN REDUCTION INTERNAL FIXATION (ORIF) LEFT PROXIMAL HUMERUS FRACTURE;  Surgeon: [name], MD;  Location: [place];  Service: Orthopedics;  Laterality: Left;   PARTIAL COLECTOMY Right 2010   SEGMENTECOMY Right 10/05/2014   Procedure: right lower lobe SEGMENTECTOMY;  Surgeon: [name], MD;  [facility];  Service: Thoracic;  Laterality: Right;   VIDEO ASSISTED THORACOSCOPY (VATS)/WEDGE RESECTION Right 10/05/2014   Procedure: Right VIDEO ASSISTED THORACOSCOPY (VATS) with right lower lobe lung nodule WEDGE RESECTION;  Surgeon: [name], MD;  Location: [place];  Service: Thoracic;  Laterality: Right;   Social History: Social History   Socioeconomic History   Marital status: Divorced    Spouse name: Not on file   Number of children: Not on file   Years of education: Not on file   Highest education level: Not on file  Occupational History   Not on file  Tobacco Use   Smoking status: Former Smoker    Packs/day: 3.00    Years: 40.00    Pack years: 120.00    Types: Cigarettes    Quit date: 02/06/2009    Years since quitting: 10.7   Smokeless tobacco: Never Used  Vaping Use   Vaping Use: Never used  Substance and Sexual Activity   Alcohol use: Yes    Alcohol/week: 0.0 standard drinks    Comment: 3-4 times a week 1-2 beers   Drug use: No   Sexual activity: Never    Birth control/protection: Post-menopausal  Other Topics Concern  Not on file  Social History Narrative   Not on file   Social Determinants of Health   Financial Resource Strain:    Difficulty of Paying Living Expenses:   Food Insecurity:    Worried About Charity fundraiser in the Last Year:    Arboriculturist in the Last Year:   Transportation Needs:    Film/video editor (Medical):    Lack of Transportation (Non-Medical):   Physical Activity:    Days of Exercise per Week:    Minutes of Exercise per Session:   Stress:    Feeling of Stress :   Social Connections:    Frequency of Communication with Friends and Family:    Frequency of  Social Gatherings with Friends and Family:    Attends Religious Services:    Active Member of Clubs or Organizations:    Attends Music therapist:    Marital Status:    Family History: Family History  Problem Relation Age of Onset   Heart disease Mother    Heart disease Brother    Allergies: Allergies  Allergen Reactions   Atorvastatin Other (See Comments)    Muscle cramps   Glipizide Er Other (See Comments)    Shaking / tingling in fingers    Medications: See med rec.  Review of Systems: See HPI for pertinent positives and negatives.   Objective:    General: Well Developed, well nourished, and in no acute distress.  Neuro: Alert and oriented x3.  HEENT: Normocephalic, atraumatic.  Skin: Warm and dry.  1 cm incision with 2 intact sutures to the right upper back medial to the scapula.  3.5- 4 cm x 2.5 cm area of induration, extremely tender.  0.5 cm area of circumscribed erythema surrounding the incision.  Small amount of serosanguineous drainage noted on the Band-Aid.  Drainage crusted around sutures is hard, light green.  Cardiac: Regular rate and rhythm, no murmurs rubs or gallops, no lower extremity edema.  Respiratory: Clear to auscultation bilaterally. Not using accessory muscles, speaking in full sentences.   Impression and Recommendations:    1. Abscess Sutures removed without difficulty, patient experienced significant discomfort with removal due to tenderness of the area involved.  Majority of the incision has closed but she does have a small area that is still open and allowing drainage.  We will continue to allow it to drain.  Advised patient to keep area clean and dry, covered with a sterile gauze or Band-Aid.  She is okay to use topical antibiotic cream/ointment.  Given no improvement since I&D completed and continued pain, starting doxycycline twice daily x7 days.  May use Tylenol/ibuprofen as well as heat or ice for pain control.  Return in  about 3 days (around 11/17/2019) for abscess check. ___________________________________________ Clearnce Sorrel, DNP, APRN, FNP-BC [facility] [place]

## 2019-11-17 ENCOUNTER — Ambulatory Visit (INDEPENDENT_AMBULATORY_CARE_PROVIDER_SITE_OTHER): Payer: Medicare HMO | Admitting: Osteopathic Medicine

## 2019-11-17 DIAGNOSIS — L0291 Cutaneous abscess, unspecified: Secondary | ICD-10-CM | POA: Diagnosis not present

## 2019-11-19 ENCOUNTER — Encounter: Payer: Self-pay | Admitting: Gastroenterology

## 2019-11-19 ENCOUNTER — Encounter: Payer: Self-pay | Admitting: Osteopathic Medicine

## 2019-11-19 ENCOUNTER — Ambulatory Visit (INDEPENDENT_AMBULATORY_CARE_PROVIDER_SITE_OTHER): Payer: Medicare HMO | Admitting: Osteopathic Medicine

## 2019-11-19 ENCOUNTER — Other Ambulatory Visit: Payer: Self-pay

## 2019-11-19 VITALS — BP 112/64 | HR 73 | Temp 97.0°F | Wt 182.0 lb

## 2019-11-19 DIAGNOSIS — L0291 Cutaneous abscess, unspecified: Secondary | ICD-10-CM

## 2019-11-20 LAB — WOUND CULTURE
MICRO NUMBER:: 10588380
RESULT:: NO GROWTH
SPECIMEN QUALITY:: ADEQUATE

## 2019-11-20 NOTE — Progress Notes (Signed)
PRE-OP DIAGNOSIS: Abscess of L upper back   POST-OP DIAGNOSIS: Same  PROCEDURE: incision and drainage of abscess Performing Physician: Sheppard Coil    PROCEDURE:   Patient informed consent obtained verbally after discussion of risks (including but not limited to pain, infection, bleeding, damage to surrounding tissues, incomplete evacuation/treatment of infection, recurrence)  and benefits (adequate treatment and diagnosis, relief of pain). All questions answered prior to procedure.   A timeout protocol was performed prior to initiating the procedure.  The area was prepared and draped in the usual, sterile manner. The site was anesthetized with 4 cc 1% lidocaine with epinephrine. A linear incision along the local skin lines was made and the purulent material expressed. The abcess was explored thoroughly and sequestered pockets were evacuated. Bleeding was minimal.   Packing: medicated packing   Followup: The patient tolerated the procedure well without complications. Standard post-procedure care is explained and return precautions are given, patient was provided with printed information & instructions.

## 2019-11-20 NOTE — Progress Notes (Signed)
Patient here today for wound check, packing still in place and was removed.  Patient states that she is feeling vastly improved, will finish out antibiotics.

## 2019-11-21 ENCOUNTER — Ambulatory Visit (INDEPENDENT_AMBULATORY_CARE_PROVIDER_SITE_OTHER): Payer: Medicare HMO

## 2019-11-21 ENCOUNTER — Ambulatory Visit: Payer: Self-pay

## 2019-11-21 ENCOUNTER — Other Ambulatory Visit: Payer: Self-pay

## 2019-11-21 ENCOUNTER — Ambulatory Visit (INDEPENDENT_AMBULATORY_CARE_PROVIDER_SITE_OTHER): Payer: Medicare HMO | Admitting: Family Medicine

## 2019-11-21 ENCOUNTER — Encounter: Payer: Self-pay | Admitting: Family Medicine

## 2019-11-21 VITALS — BP 114/68 | HR 75 | Ht 68.0 in | Wt 183.8 lb

## 2019-11-21 DIAGNOSIS — M25511 Pain in right shoulder: Secondary | ICD-10-CM | POA: Diagnosis not present

## 2019-11-21 DIAGNOSIS — G8929 Other chronic pain: Secondary | ICD-10-CM | POA: Diagnosis not present

## 2019-11-21 DIAGNOSIS — H5213 Myopia, bilateral: Secondary | ICD-10-CM | POA: Diagnosis not present

## 2019-11-21 DIAGNOSIS — E119 Type 2 diabetes mellitus without complications: Secondary | ICD-10-CM | POA: Diagnosis not present

## 2019-11-21 DIAGNOSIS — H2513 Age-related nuclear cataract, bilateral: Secondary | ICD-10-CM | POA: Diagnosis not present

## 2019-11-21 DIAGNOSIS — H524 Presbyopia: Secondary | ICD-10-CM | POA: Diagnosis not present

## 2019-11-21 DIAGNOSIS — Z7984 Long term (current) use of oral hypoglycemic drugs: Secondary | ICD-10-CM | POA: Diagnosis not present

## 2019-11-21 NOTE — Progress Notes (Signed)
I, [name], LAT, ATC, am serving as scribe for Dr. [name].  Kelli [name] is a 74 y.o. female who presents to [place] at [facility] today for f/u of B shoulder pain, particularly the R shoulder.  She was last seen by Dr. [name] on 08/21/19 for her L shoulder and before that on 08/14/19 and had a R subacromial injection.  Since her last visit, pt reports that her last R shoulder injection only lasted for about 2 weeks and has progressively worsened.  She is having pain at her R lateral shoulder and into her R upper arm.  She denies any mechanical symptoms.    Diagnostic testing: R shoulder XR- 03/09/16; L shoulder XR- 02/24/19, 12/23/18, 11/22/18   Pertinent review of systems: No fevers or chills  Relevant historical information: Diabetes   Exam:  BP 114/68 (BP Location: Left Arm, Patient Position: Sitting, Cuff Size: Normal)   Pulse 75   Ht 5\' 8"  (1.727 m)   Wt 183 lb 12.8 oz (83.4 kg)   LMP  (LMP Unknown)   SpO2 96%   BMI 27.95 kg/m  General: Well Developed, well nourished, and in no acute distress.   MSK: Right shoulder Range of motion abduction 140 degrees external rotation full internal rotation lumbar spine. Strength 4/5 abduction 4/5 external rotation 5/5 internal rotation. Positive Hawkins and Neer's test. Negative Yergason's and speeds test.    Lab and Radiology Results  Procedure: Real-time Ultrasound Guided Injection of right shoulder subacromial bursa Device: Philips Affiniti 50G Images permanently stored and available for review in the ultrasound unit. Verbal informed consent obtained.  Discussed risks and benefits of procedure. Warned about infection bleeding damage to structures skin hypopigmentation and fat atrophy among others. Patient expresses understanding and agreement Time-out conducted.   Noted no overlying erythema, induration, or other signs of local infection.   Skin prepped in a sterile fashion.   Local anesthesia:  Topical Ethyl chloride.   With sterile technique and under real time ultrasound guidance:  40 mg of Kenalog and 2 mL of Marcaine injected easily.   Completed without difficulty   Pain immediately resolved suggesting accurate placement of the medication.   Advised to call if fevers/chills, erythema, induration, drainage, or persistent bleeding.   Images permanently stored and available for review in the ultrasound unit.  Impression: Technically successful ultrasound guided injection.    X-ray images right shoulder obtained today personally and independently reviewed AC DJD.  And dizzy optic changes present at supraspinatus insertion on the humerus.  No acute fractures. Await formal radiology review    Assessment and Plan: 73 y.o. female with persistent right shoulder pain.  Patient did not have good benefit from steroid injection last visit.  She had immediate benefit but it only lasted about 2 weeks.  After discussion with patient today plan for repeat injection and x-ray.  If injection does not last very long next step would be physical therapy which I am happy to arrange for her over the phone.  Could consider MRI for potential surgical planning.   PDMP not reviewed this encounter. Orders Placed This Encounter  Procedures  . [place] LIMITED JOINT SPACE STRUCTURES UP RIGHT(NO LINKED CHARGES)    Order Specific Question:   Reason for Exam (SYMPTOM  OR DIAGNOSIS REQUIRED)    Answer:   R shoulder pain    Order Specific Question:   Preferred imaging location?    Answer:   [facility]  . DG Shoulder  Right    Standing Status:   Future    Number of Occurrences:   1    Standing Expiration Date:   11/20/2020    Order Specific Question:   Reason for Exam (SYMPTOM  OR DIAGNOSIS REQUIRED)    Answer:   eval right shoulder pain    Order Specific Question:   Preferred imaging location?    Answer:   [facility]    Order Specific Question:   Radiology Contrast Protocol  - do NOT remove file path    Answer:   [email or web address]   No orders of the defined types were placed in this encounter.    Discussed warning signs or symptoms. Please see discharge instructions. Patient expresses understanding.   The above documentation has been reviewed and is accurate and complete [name], M.D.

## 2019-11-21 NOTE — Patient Instructions (Signed)
Thank you for coming in today. Plan for xray today on the way out.  I will get the results to you on Monday likely.  If not improved let me know and I will order physical therapy in [place].  Next step after that if needed is MRI.  Keep me updated. Mychart is good for that.   Call or go to the ER if you develop a large red swollen joint with extreme pain or oozing puss.

## 2019-11-24 NOTE — Progress Notes (Signed)
X-ray right shoulder shows mild arthritis with no fractures.

## 2019-11-27 ENCOUNTER — Other Ambulatory Visit: Payer: Self-pay | Admitting: Osteopathic Medicine

## 2019-11-27 DIAGNOSIS — Z1231 Encounter for screening mammogram for malignant neoplasm of breast: Secondary | ICD-10-CM

## 2019-12-04 ENCOUNTER — Ambulatory Visit (INDEPENDENT_AMBULATORY_CARE_PROVIDER_SITE_OTHER): Payer: Medicare HMO

## 2019-12-04 ENCOUNTER — Other Ambulatory Visit: Payer: Self-pay

## 2019-12-04 DIAGNOSIS — Z1231 Encounter for screening mammogram for malignant neoplasm of breast: Secondary | ICD-10-CM

## 2019-12-12 ENCOUNTER — Other Ambulatory Visit: Payer: Self-pay | Admitting: Osteopathic Medicine

## 2019-12-12 DIAGNOSIS — R928 Other abnormal and inconclusive findings on diagnostic imaging of breast: Secondary | ICD-10-CM

## 2019-12-24 ENCOUNTER — Encounter: Payer: Self-pay | Admitting: Osteopathic Medicine

## 2019-12-24 ENCOUNTER — Other Ambulatory Visit: Payer: Self-pay

## 2019-12-24 ENCOUNTER — Ambulatory Visit (INDEPENDENT_AMBULATORY_CARE_PROVIDER_SITE_OTHER): Payer: Medicare HMO | Admitting: Osteopathic Medicine

## 2019-12-24 VITALS — BP 132/80 | HR 72 | Wt 182.0 lb

## 2019-12-24 DIAGNOSIS — E785 Hyperlipidemia, unspecified: Secondary | ICD-10-CM

## 2019-12-24 DIAGNOSIS — Z794 Long term (current) use of insulin: Secondary | ICD-10-CM | POA: Diagnosis not present

## 2019-12-24 DIAGNOSIS — E1169 Type 2 diabetes mellitus with other specified complication: Secondary | ICD-10-CM

## 2019-12-24 LAB — POCT GLYCOSYLATED HEMOGLOBIN (HGB A1C)
HbA1c POC (<> result, manual entry): 6.2 % (ref 4.0–5.6)
HbA1c, POC (controlled diabetic range): 6.2 % (ref 0.0–7.0)
HbA1c, POC (prediabetic range): 6.2 % (ref 5.7–6.4)
Hemoglobin A1C: 6.2 % — AB (ref 4.0–5.6)

## 2019-12-24 NOTE — Progress Notes (Signed)
Kelli [name] is a 74 y.o. female who presents to  [place] at [facility]  today, 12/24/19, seeking care for the following:  . DM2 recheck - last A1C 09/16/19  DIABETES SCREENING/PREVENTIVE CARE: A1C past 3-6 mos:Yes  12/11/18: 9.9  03/12/19: 7.9  06/16/19: 8.5   09/16/19: 8.1   Today 12/24/19: 6.2 yay!  Medications:   Basaglar to titrate up to target fasting glucose = currently at 30 units per day    BP goal <130/80:Yes BP Readings from Last 3 Encounters:  12/24/19 132/80  11/21/19 114/68  11/19/19 112/64   LDL goal <70:No  03/12/19: 141  Pravastatin 80 mg, higher potency statins caused myalgia  Eye exam annually: none on file, importance discussed with patient Foot exam: needs Microalbuminuria: routine Metformin:No- intolerant  ACE/ARB:No- BP has been good  Antiplatelet if ASCVD Risk >10%:Yes Statin:Yes Pneumovax:Yes    ASSESSMENT & PLAN with other pertinent findings:  The primary encounter diagnosis was Type 2 diabetes mellitus with other specified complication, with long-term current use of insulin ([place]). A diagnosis of Hyperlipidemia, unspecified hyperlipidemia type was also pertinent to this visit.   Results for orders placed or performed in visit on 12/24/19 (from the past 24 hour(s))  POCT HgB A1C     Status: Abnormal   Collection Time: 12/24/19  3:26 PM  Result Value Ref Range   Hemoglobin A1C 6.2 (A) 4.0 - 5.6 %   HbA1c POC (<> result, manual entry) 6.2 4.0 - 5.6 %   HbA1c, POC (prediabetic range) 6.2 5.7 - 6.4 %   HbA1c, POC (controlled diabetic range) 6.2 0.0 - 7.0 %   Doing great! No concerns Continue current Rx Will be due for labs around next check-up      There are no Patient Instructions on file for this visit.  Orders Placed This Encounter  Procedures  . CBC  . COMPLETE METABOLIC PANEL WITH GFR  . Lipid panel  . Hemoglobin A1c  . POCT HgB A1C    No orders  of the defined types were placed in this encounter.      Follow-up instructions: Return in about 3 months (around 04/02/2020) for Follow up A1C , ANNUAL (get labs few days prior to visit, orders are in).                                         BP 132/80 (BP Location: Left Arm, Patient Position: Sitting)   Pulse 72   Wt 182 lb (82.6 kg)   LMP  (LMP Unknown)   SpO2 97%   BMI 27.67 kg/m   Current Meds  Medication Sig  . AMBULATORY NON FORMULARY MEDICATION Single glucometer with lancets, test strips. Test daily.  Disp qs 3 months E11.9  . aspirin 81 MG tablet Take 81 mg by mouth daily.  . B Complex-C (B-COMPLEX WITH VITAMIN C) tablet Take 1 tablet by mouth daily.  . B-D UF III MINI PEN NEEDLES 31G X 5 MM MISC See admin instructions.  . Cholecalciferol (VITAMIN D) 2000 UNITS tablet Take 2,000 Units by mouth daily.  . cloNIDine (CATAPRES) 0.1 MG tablet Take 1 tablet (0.1 mg total) by mouth at bedtime.  [name] FLUoxetine (PROZAC) 40 MG capsule Take 1 capsule (40 mg total) by mouth daily.  [name] gabapentin (NEURONTIN) 300 MG capsule Take 1 capsule (300 mg total) by mouth at bedtime  as needed (insomnia).  . Insulin Glargine (BASAGLAR KWIKPEN) 100 UNIT/ML SOPN Inject 0.1 mLs (10 Units total) into the skin at bedtime. Increase as directed: increase by 2 units every 3 days t fasting blood sugar goal 120-150. Max dose 60 units  . OneTouch Delica Lancets 42L MISC TEST BLOOD SUGARS DAILY  . ONETOUCH VERIO test strip TEST BLOOD SUGARS DAILY  . pravastatin (PRAVACHOL) 80 MG tablet Take 1 tablet (80 mg total) by mouth daily.    Results for orders placed or performed in visit on 12/24/19 (from the past 72 hour(s))  POCT HgB A1C     Status: Abnormal   Collection Time: 12/24/19  3:26 PM  Result Value Ref Range   Hemoglobin A1C 6.2 (A) 4.0 - 5.6 %   HbA1c POC (<> result, manual entry) 6.2 4.0 - 5.6 %   HbA1c, POC (prediabetic range) 6.2 5.7 - 6.4 %   HbA1c, POC  (controlled diabetic range) 6.2 0.0 - 7.0 %    No results found.     All questions at time of visit were answered - patient instructed to contact office with any additional concerns or updates.  ER/RTC precautions were reviewed with the patient as applicable.   Please note: voice recognition software was used to produce this document, and typos may escape review. Please contact Dr. Sheppard Coil for any needed clarifications.   Total encounter time: 20 minutes.

## 2019-12-25 ENCOUNTER — Ambulatory Visit
Admission: RE | Admit: 2019-12-25 | Discharge: 2019-12-25 | Disposition: A | Discharge status: Home / Self Care | Payer: Medicare HMO | Source: Ambulatory Visit | Attending: Osteopathic Medicine | Admitting: Osteopathic Medicine

## 2019-12-25 ENCOUNTER — Other Ambulatory Visit: Payer: Self-pay | Admitting: Osteopathic Medicine

## 2019-12-25 DIAGNOSIS — R928 Other abnormal and inconclusive findings on diagnostic imaging of breast: Secondary | ICD-10-CM

## 2019-12-25 DIAGNOSIS — N6021 Fibroadenosis of right breast: Secondary | ICD-10-CM | POA: Diagnosis not present

## 2019-12-25 DIAGNOSIS — N6022 Fibroadenosis of left breast: Secondary | ICD-10-CM | POA: Diagnosis not present

## 2020-01-06 ENCOUNTER — Other Ambulatory Visit: Payer: Self-pay | Admitting: Osteopathic Medicine

## 2020-01-16 ENCOUNTER — Ambulatory Visit (AMBULATORY_SURGERY_CENTER): Payer: Self-pay | Admitting: *Deleted

## 2020-01-16 ENCOUNTER — Encounter: Payer: Self-pay | Admitting: Gastroenterology

## 2020-01-16 ENCOUNTER — Other Ambulatory Visit: Payer: Self-pay

## 2020-01-16 VITALS — Ht 68.0 in | Wt 183.0 lb

## 2020-01-16 DIAGNOSIS — Z85038 Personal history of other malignant neoplasm of large intestine: Secondary | ICD-10-CM

## 2020-01-16 NOTE — Progress Notes (Signed)

## 2020-01-21 ENCOUNTER — Other Ambulatory Visit: Payer: Self-pay | Admitting: Osteopathic Medicine

## 2020-01-30 ENCOUNTER — Encounter: Payer: Self-pay | Admitting: Gastroenterology

## 2020-01-30 ENCOUNTER — Other Ambulatory Visit: Payer: Self-pay

## 2020-01-30 ENCOUNTER — Ambulatory Visit (AMBULATORY_SURGERY_CENTER): Payer: Medicare HMO | Admitting: Gastroenterology

## 2020-01-30 ENCOUNTER — Other Ambulatory Visit: Payer: Self-pay | Admitting: Osteopathic Medicine

## 2020-01-30 VITALS — BP 92/56 | HR 58 | Temp 97.0°F | Resp 16 | Ht 68.0 in | Wt 183.0 lb

## 2020-01-30 DIAGNOSIS — Z86010 Personal history of colonic polyps: Secondary | ICD-10-CM | POA: Diagnosis not present

## 2020-01-30 DIAGNOSIS — K621 Rectal polyp: Secondary | ICD-10-CM

## 2020-01-30 DIAGNOSIS — K635 Polyp of colon: Secondary | ICD-10-CM

## 2020-01-30 DIAGNOSIS — Z85038 Personal history of other malignant neoplasm of large intestine: Secondary | ICD-10-CM

## 2020-01-30 DIAGNOSIS — Z800 Family history of malignant neoplasm of digestive organs: Secondary | ICD-10-CM | POA: Diagnosis not present

## 2020-01-30 DIAGNOSIS — D124 Benign neoplasm of descending colon: Secondary | ICD-10-CM

## 2020-01-30 DIAGNOSIS — D128 Benign neoplasm of rectum: Secondary | ICD-10-CM

## 2020-01-30 MED ORDER — SODIUM CHLORIDE 0.9 % IV SOLN: 500.0000 mL | Freq: Once | INTRAVENOUS | Status: DC

## 2020-01-30 NOTE — Patient Instructions (Signed)
Discharge instructions given. Handout on polyps. Resume previous medications. YOU HAD AN ENDOSCOPIC PROCEDURE TODAY AT THE [facility]:   Refer to the procedure report that was given to you for any specific questions about what was found during the examination.  If the procedure report does not answer your questions, please call your gastroenterologist to clarify.  If you requested that your care partner not be given the details of your procedure findings, then the procedure report has been included in a sealed envelope for you to review at your convenience later.  YOU SHOULD EXPECT: Some feelings of bloating in the abdomen. Passage of more gas than usual.  Walking can help get rid of the air that was put into your GI tract during the procedure and reduce the bloating. If you had a lower endoscopy (such as a colonoscopy or flexible sigmoidoscopy) you may notice spotting of blood in your stool or on the toilet paper. If you underwent a bowel prep for your procedure, you may not have a normal bowel movement for a few days.  Please Note:  You might notice some irritation and congestion in your nose or some drainage.  This is from the oxygen used during your procedure.  There is no need for concern and it should clear up in a day or so.  SYMPTOMS TO REPORT IMMEDIATELY:  Following lower endoscopy (colonoscopy or flexible sigmoidoscopy):  Excessive amounts of blood in the stool  Significant tenderness or worsening of abdominal pains  Swelling of the abdomen that is new, acute  Fever of 100F or higher   For urgent or emergent issues, a gastroenterologist can be reached at any hour by calling [phone]. Do not use MyChart messaging for urgent concerns.    DIET:  We do recommend a small meal at first, but then you may proceed to your regular diet.  Drink plenty of fluids but you should avoid alcoholic beverages for 24 hours.  ACTIVITY:  You should plan to take it easy for the rest  of today and you should NOT DRIVE or use heavy machinery until tomorrow (because of the sedation medicines used during the test).    FOLLOW UP: Our staff will call the number listed on your records 48-72 hours following your procedure to check on you and address any questions or concerns that you may have regarding the information given to you following your procedure. If we do not reach you, we will leave a message.  We will attempt to reach you two times.  During this call, we will ask if you have developed any symptoms of COVID 19. If you develop any symptoms (ie: fever, flu-like symptoms, shortness of breath, cough etc.) before then, please call [phone].  If you test positive for Covid 19 in the 2 weeks post procedure, please call and report this information to us.    If any biopsies were taken you will be contacted by phone or by letter within the next 1-3 weeks.  Please call us at [phone] if you have not heard about the biopsies in 3 weeks.    SIGNATURES/CONFIDENTIALITY: You and/or your care partner have signed paperwork which will be entered into your electronic medical record.  These signatures attest to the fact that that the information above on your After Visit Summary has been reviewed and is understood.  Full responsibility of the confidentiality of this discharge information lies with you and/or your care-partner.  

## 2020-01-30 NOTE — Progress Notes (Signed)
Report given to PACU, vss 

## 2020-01-30 NOTE — Progress Notes (Signed)
Called to room to assist during endoscopic procedure.  Patient ID and intended procedure confirmed with present staff. Received instructions for my participation in the procedure from the performing physician.  

## 2020-01-30 NOTE — Op Note (Signed)
[place] Patient Name: Kelli [name] Procedure Date: 01/30/2020 1:27 PM MRN: [id number] Endoscopist: Milus Banister , MD Age: 74 Referring MD:  Date of Birth: [birth year] Gender: Female Account #: [id number] Procedure:                Colonoscopy Indications:              High risk colon cancer surveillance: Personal                            history of colon cancer; T3N0M0 cecal cancer                            resected 2010; +adjuvant chemo; 02/2010 colonoscopy                            Ardis Hughs several small polyps (all were HPs except                            one TA), anastomosis normal. Colonoscopy 2016 two                            subCM polyps, one was a TA Medicines:                Monitored Anesthesia Care Procedure:                Pre-Anesthesia Assessment:                           - Prior to the procedure, a History and Physical                            was performed, and patient medications and                            allergies were reviewed. The patient's tolerance of                            previous anesthesia was also reviewed. The risks                            and benefits of the procedure and the sedation                            options and risks were discussed with the patient.                            All questions were answered, and informed consent                            was obtained. Prior Anticoagulants: The patient has                            taken no previous anticoagulant or antiplatelet  agents. ASA Grade Assessment: II - A patient with                            mild systemic disease. After reviewing the risks                            and benefits, the patient was deemed in                            satisfactory condition to undergo the procedure.                           After obtaining informed consent, the colonoscope                            was passed under direct vision. Throughout  the                            procedure, the patient's blood pressure, pulse, and                            oxygen saturations were monitored continuously. The                            Colonoscope was introduced through the anus and                            advanced to the the cecum, identified by                            appendiceal orifice and ileocecal valve. The                            colonoscopy was performed without difficulty. The                            patient tolerated the procedure well. The quality                            of the bowel preparation was good. The ileocecal                            valve, appendiceal orifice, and rectum were                            photographed. Scope In: 1:29:24 PM Scope Out: 1:43:30 PM Scope Withdrawal Time: 0 hours 7 minutes 57 seconds  Total Procedure Duration: 0 hours 14 minutes 6 seconds  Findings:                 Normal appearing ileocolonic anastomosis in the                            right colon.  Two sessile polyps were found in the rectum and                            descending colon. The polyps were 2 to 3 mm in                            size. These polyps were removed with a cold snare.                            Resection and retrieval were complete.                           The exam was otherwise without abnormality on                            direct and retroflexion views. Complications:            No immediate complications. Estimated blood loss:                            None. Estimated Blood Loss:     Estimated blood loss: none. Impression:               - Two 2 to 3 mm polyps in the rectum and in the                            descending colon, removed with a cold snare.                            Resected and retrieved.                           - Normal ileocolonic anastomosis.                           - The examination was otherwise normal on direct                             and retroflexion views. Recommendation:           - Patient has a contact number available for                            emergencies. The signs and symptoms of potential                            delayed complications were discussed with the                            patient. Return to normal activities tomorrow.                            Written discharge instructions were provided to the  patient.                           - Resume previous diet.                           - Continue present medications.                           - Await pathology results. Milus Banister, MD 01/30/2020 1:47:09 PM This report has been signed electronically.

## 2020-01-30 NOTE — Progress Notes (Signed)
VS by JK  Pt's states no medical or surgical changes since previsit or office visit.

## 2020-02-03 ENCOUNTER — Telehealth: Payer: Self-pay

## 2020-02-03 NOTE — Telephone Encounter (Signed)
  Follow up Call-  Call back number 01/30/2020  Post procedure Call Back phone  # [phone]  Permission to leave phone message Yes  Some recent data might be hidden     Patient questions:  Do you have a fever, pain , or abdominal swelling? No. Pain Score  0 *  Have you tolerated food without any problems? No.  Have you been able to return to your normal activities? Yes.    Do you have any questions about your discharge instructions: Diet   No. Medications  No. Follow up visit  No.  Do you have questions or concerns about your Care? No.  Actions: * If pain score is 4 or above: No action needed, pain <4.  1. Have you developed a fever since your procedure? no  2.   Have you had an respiratory symptoms (SOB or cough) since your procedure? no  3.   Have you tested positive for COVID 19 since your procedure no  4.   Have you had any family members/close contacts diagnosed with the COVID 19 since your procedure?  no   If yes to any of these questions please route to Joylene John, RN and Joella Prince, RN

## 2020-02-06 ENCOUNTER — Encounter: Payer: Self-pay | Admitting: Gastroenterology

## 2020-02-17 ENCOUNTER — Other Ambulatory Visit: Payer: Self-pay | Admitting: Family Medicine

## 2020-02-17 DIAGNOSIS — M19041 Primary osteoarthritis, right hand: Secondary | ICD-10-CM

## 2020-02-18 NOTE — Telephone Encounter (Signed)
Last refill-03/25/19  Last ov-12/24/19

## 2020-03-17 ENCOUNTER — Encounter: Payer: Self-pay | Admitting: Osteopathic Medicine

## 2020-03-29 ENCOUNTER — Other Ambulatory Visit: Payer: Self-pay | Admitting: Osteopathic Medicine

## 2020-03-29 DIAGNOSIS — E785 Hyperlipidemia, unspecified: Secondary | ICD-10-CM | POA: Diagnosis not present

## 2020-03-29 DIAGNOSIS — E1169 Type 2 diabetes mellitus with other specified complication: Secondary | ICD-10-CM | POA: Diagnosis not present

## 2020-03-29 DIAGNOSIS — Z794 Long term (current) use of insulin: Secondary | ICD-10-CM | POA: Diagnosis not present

## 2020-03-30 LAB — CBC
HCT: 39.2 % (ref 35.0–45.0)
Hemoglobin: 12.8 g/dL (ref 11.7–15.5)
MCH: 30.8 pg (ref 27.0–33.0)
MCHC: 32.7 g/dL (ref 32.0–36.0)
MCV: 94.2 fL (ref 80.0–100.0)
MPV: 11.5 fL (ref 7.5–12.5)
Platelets: 306 10*3/uL (ref 140–400)
RBC: 4.16 10*6/uL (ref 3.80–5.10)
RDW: 12.6 % (ref 11.0–15.0)
WBC: 7.7 10*3/uL (ref 3.8–10.8)

## 2020-03-30 LAB — COMPLETE METABOLIC PANEL WITH GFR
AG Ratio: 1.6 (calc) (ref 1.0–2.5)
ALT: 15 U/L (ref 6–29)
AST: 15 U/L (ref 10–35)
Albumin: 4.1 g/dL (ref 3.6–5.1)
Alkaline phosphatase (APISO): 91 U/L (ref 37–153)
BUN: 11 mg/dL (ref 7–25)
CO2: 29 mmol/L (ref 20–32)
Calcium: 9.2 mg/dL (ref 8.6–10.4)
Chloride: 104 mmol/L (ref 98–110)
Creat: 0.68 mg/dL (ref 0.60–0.93)
GFR, Est African American: 101 mL/min/{1.73_m2} (ref >=60)
GFR, Est Non African American: 87 mL/min/{1.73_m2} (ref >=60)
Globulin: 2.5 g/dL (calc) (ref 1.9–3.7)
Glucose, Bld: 193 mg/dL — ABNORMAL HIGH (ref 65–99)
Potassium: 4 mmol/L (ref 3.5–5.3)
Sodium: 139 mmol/L (ref 135–146)
Total Bilirubin: 0.6 mg/dL (ref 0.2–1.2)
Total Protein: 6.6 g/dL (ref 6.1–8.1)

## 2020-03-30 LAB — LIPID PANEL
Cholesterol: 259 mg/dL — ABNORMAL HIGH (ref <200)
HDL: 41 mg/dL — ABNORMAL LOW (ref >=50)
LDL Cholesterol (Calc): 179 mg/dL (calc) — ABNORMAL HIGH
Non-HDL Cholesterol (Calc): 218 mg/dL (calc) — ABNORMAL HIGH (ref <130)
Total CHOL/HDL Ratio: 6.3 (calc) — ABNORMAL HIGH (ref <5.0)
Triglycerides: 229 mg/dL — ABNORMAL HIGH (ref <150)

## 2020-03-30 LAB — HEMOGLOBIN A1C
Hgb A1c MFr Bld: 8.5 % of total Hgb — ABNORMAL HIGH (ref <5.7)
Mean Plasma Glucose: 197 (calc)
eAG (mmol/L): 10.9 (calc)

## 2020-04-01 ENCOUNTER — Ambulatory Visit (INDEPENDENT_AMBULATORY_CARE_PROVIDER_SITE_OTHER): Payer: Medicare HMO | Admitting: Osteopathic Medicine

## 2020-04-01 ENCOUNTER — Encounter: Payer: Self-pay | Admitting: Osteopathic Medicine

## 2020-04-01 ENCOUNTER — Other Ambulatory Visit: Payer: Self-pay | Admitting: Osteopathic Medicine

## 2020-04-01 VITALS — BP 129/73 | HR 73 | Temp 97.5°F | Wt 183.0 lb

## 2020-04-01 DIAGNOSIS — E782 Mixed hyperlipidemia: Secondary | ICD-10-CM | POA: Diagnosis not present

## 2020-04-01 DIAGNOSIS — E1169 Type 2 diabetes mellitus with other specified complication: Secondary | ICD-10-CM

## 2020-04-01 DIAGNOSIS — Z794 Long term (current) use of insulin: Secondary | ICD-10-CM | POA: Diagnosis not present

## 2020-04-01 DIAGNOSIS — I700 Atherosclerosis of aorta: Secondary | ICD-10-CM | POA: Diagnosis not present

## 2020-04-01 DIAGNOSIS — Z23 Encounter for immunization: Secondary | ICD-10-CM | POA: Diagnosis not present

## 2020-04-01 DIAGNOSIS — E119 Type 2 diabetes mellitus without complications: Secondary | ICD-10-CM

## 2020-04-01 DIAGNOSIS — E785 Hyperlipidemia, unspecified: Secondary | ICD-10-CM | POA: Diagnosis not present

## 2020-04-01 MED ORDER — REPATHA 140 MG/ML ~~LOC~~ SOSY: 140.0000 mg | PREFILLED_SYRINGE | SUBCUTANEOUS | 3 refills | Status: DC

## 2020-04-01 NOTE — Patient Instructions (Addendum)
Plan:  Diabetes: Exercise as able  Limit carbohydrates as able  Can increase insulin by 2-3 units every few days if not able to do much in the way of exercise/diet changes. Goal fasting Glc 110-140   Cholesterol:  Will add on Rx to help get LDL better an reduce risk of heart attack/stroke

## 2020-04-01 NOTE — Progress Notes (Signed)
HPI: Kelli [name] is a 74 y.o. female who  has a past medical history of Anxiety, Arthritis, Borderline diabetic, Colon cancer ([place]), Depression, Depression with anxiety (09/19/2011), H/O colon cancer, stage II (09/19/2011), High cholesterol, Hyperlipidemia (09/19/2011), Lung nodules (09/19/2011), and Urinary urgency.  she presents to [facility] today, 04/01/20,  for chief complaint of:  DM2 Check  Annual Physical   Doing well, no complaints.   A1C has increased from 6.2 to 8.5% since 12/2019. Reports decreased exercise since the weather has gotten colder and she cannot swim. She recently started walking some for exercise. 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   LDL is increased and HDL has decreased. Overall feels related to diet.       Past medical, surgical, social and family history reviewed:  Patient Active Problem List   Diagnosis Date Noted  . Closed fracture of left proximal humerus 09/26/2018  . Squamous cell carcinoma 03/05/2017  . Echocardiogram shows left ventricular diastolic dysfunction 60/63/0160  . Heart palpitations 07/25/2016  . Need for vaccination for pneumococcus 07/13/2016  . Osteoarthritis of hand, right 08/06/2015  . Diabetes mellitus type 2, uncomplicated ([place]) 10/93/2355  . Lung cancer, lower lobe ([place]) 10/09/2014  . Hematochezia 04/01/2013  . H/O colon cancer, stage II 09/19/2011  . Depression with anxiety 09/19/2011  . Hyperlipidemia 09/19/2011    Past Surgical History:  Procedure Laterality Date  . APPENDECTOMY    . AUGMENTATION MAMMAPLASTY    . BREAST SURGERY  1978   augmentation  . COLON SURGERY     2010  . CRYO INTERCOSTAL NERVE BLOCK N/A 10/05/2014   Procedure: CRYO INTERCOSTAL NERVE BLOCK;  Surgeon: [name], MD;  Location: [place];  Service: Thoracic;  Laterality: N/A;  . LYMPH NODE  DISSECTION Right 10/05/2014   Procedure: LYMPH NODE DISSECTION;  Surgeon: [name], MD;  Location: [place];  Service: Thoracic;  Laterality: Right;  . ORIF HUMERUS FRACTURE Left 10/01/2018   Procedure: OPEN REDUCTION INTERNAL FIXATION (ORIF) LEFT PROXIMAL HUMERUS FRACTURE;  Surgeon: [name], MD;  Location: [place];  Service: Orthopedics;  Laterality: Left;  . PARTIAL COLECTOMY Right 2010  . SEGMENTECOMY Right 10/05/2014   Procedure: right lower lobe SEGMENTECTOMY;  Surgeon: [name], MD;  Location: [place];  Service: Thoracic;  Laterality: Right;  [name] VIDEO ASSISTED THORACOSCOPY (VATS)/WEDGE RESECTION Right 10/05/2014   Procedure: Right VIDEO ASSISTED THORACOSCOPY (VATS) with right lower lobe lung nodule WEDGE RESECTION;  Surgeon: [name], MD;  Location: [place];  Service: Thoracic;  Laterality: Right;    Social History   Tobacco Use  . Smoking status: Former Smoker    Packs/day: 3.00    Years: 40.00    Pack years: 120.00    Types: Cigarettes    Quit date: 02/06/2009    Years since quitting: 11.1  . Smokeless tobacco: Never Used  Substance Use Topics  . Alcohol use: Yes    Alcohol/week: 0.0 standard drinks    Comment: 3-4 times a week 1-2 beers    Family History  Problem Relation Age of Onset  . Heart disease Mother   . Colon polyps Sister   . Heart disease Brother   . Colon cancer Neg Hx   . Esophageal cancer Neg Hx   . Stomach cancer Neg Hx   . Rectal cancer Neg Hx  Current medication list and allergy/intolerance information reviewed:    Current Outpatient Medications  Medication Sig Dispense Refill  . gabapentin (NEURONTIN) 300 MG capsule TAKE 1 CAPSULE (300 MG TOTAL) BY MOUTH AT BEDTIME AS NEEDED (INSOMNIA). 90 capsule 3  . AMBULATORY NON FORMULARY MEDICATION Single glucometer with lancets, test strips. Test daily.  Disp qs 3 months E11.9 1 each 0  . aspirin 81 MG tablet Take 81 mg by mouth daily.    . B Complex-C (B-COMPLEX WITH  VITAMIN C) tablet Take 1 tablet by mouth daily.    . B-D UF III MINI PEN NEEDLES 31G X 5 MM MISC USE AS DIRECTED 100 each 12  . Cholecalciferol (VITAMIN D) 2000 UNITS tablet Take 2,000 Units by mouth daily.    . cloNIDine (CATAPRES) 0.1 MG tablet Take 1 tablet (0.1 mg total) by mouth at bedtime. 90 tablet 3  . FLUoxetine (PROZAC) 40 MG capsule Take 1 capsule (40 mg total) by mouth daily. 90 capsule 3  . Insulin Glargine (BASAGLAR KWIKPEN) 100 UNIT/ML 10 UNITS AT BED INCREASE BY 2 UNITS EVERY 3 DAYS TIL FASTING SUGAR GOAL 120-150 MAX 60 UNITS PER DAY 10 mL 11  . OneTouch Delica Lancets 21H MISC TEST BLOOD SUGARS DAILY 100 each 0  . ONETOUCH VERIO test strip TEST BLOOD SUGARS DAILY 100 strip 1  . pravastatin (PRAVACHOL) 80 MG tablet Take 1 tablet (80 mg total) by mouth daily. 90 tablet 3   No current facility-administered medications for this visit.    Allergies  Allergen Reactions  . Atorvastatin Other (See Comments)    Muscle cramps  . Glipizide Er Other (See Comments)    Shaking / tingling in fingers       Review of Systems:  Constitutional:  No recent illness, No unintentional weight changes.  Cardiac: No  chest pain  Respiratory:  No  shortness of breath.   Gastrointestinal: No  abdominal pain, no changes in bowel movments   Musculoskeletal: No new myalgia/arthralgia  Psychiatric: No  concerns with depression, No  concerns with anxiety,  No mood problems  Exam:  BP 129/73 (BP Location: Left Arm, Patient Position: Sitting, Cuff Size: Normal)   Pulse 73   Temp (!) 97.5 F (36.4 C) (Oral)   Wt 183 lb (83 kg)   LMP  (LMP Unknown)   BMI 27.83 kg/m   Constitutional: VS see above. General Appearance: alert, well-developed, well-nourished, NAD  Eyes: Normal lids and conjunctive, non-icteric sclera  Ears, Nose, Mouth, Throat: MMM, Normal external inspection ears/nares/mouth/lips/gums.  Pharynx/tonsils no erythema, no exudate.   Neck: No masses, trachea midline. No  thyroid enlargement. No tenderness/mass appreciated. No lymphadenopathy  Respiratory: Normal respiratory effort. no wheeze, no rhonchi, no rales  Cardiovascular: S1/S2 normal, no murmur, no rub/gallop auscultated. RRR. No lower extremity edema.Radial pulses equal and strong bilaterally.   Gastrointestinal: Nontender, no masses. No hepatomegaly, no splenomegaly. No hernia appreciated. Bowel sounds normal. Rectal exam deferred.   Musculoskeletal: Gait normal. No clubbing/cyanosis of digits.   Neurological: No cranial nerve deficit on limited exam. Motor and sensation intact and symmetric. Cerebellar reflexes intact.   Skin: warm, dry, intact. No rash/ulcer.   Psychiatric: Normal judgment/insight. Normal mood and affect.    No results found for this or any previous visit (from the past 72 hour(s)).  No results found.      ASSESSMENT/PLAN: The primary encounter diagnosis was Type 2 diabetes mellitus without complication, with long-term current use of insulin ([place]). Diagnoses of Need for influenza vaccination,  Mixed hyperlipidemia, Atherosclerosis of aorta ([place]), and Hyperlipidemia associated with type 2 diabetes mellitus ([place]) were also pertinent to this visit.  #DM2  - A1c has increased from 6.2 to 8.5% since 12/2019. Likely due to decreased exercise and poor diet.  - will up titrate insulin 2 units at a time until fasting blood glucose is 110-140  - A1C before next visit   #mixed HLD - LDL increased from 141 (12/2019) to 179.  - likely due to decreased exercise and poor diet -unable to tolerate high potency statins, continue pravastatin  - will add PSCK-9 inhibitor for better LDL control  - repeat lipid panel before next visit    # Simple cyst identified on mammogram in 12/2019 - scheduled for f/u mammogram in 06/2020  #influenza vaccine due - administered today    Orders Placed This Encounter  Procedures  . Flu Vaccine QUAD High Dose(Fluad)  . Hemoglobin A1c  . Lipid  panel    No orders of the defined types were placed in this encounter.   Patient Instructions  Plan:  Diabetes: Exercise as able  Limit carbohydrates as able  Can increase insulin by 2-3 units every few days if not able to do much in the way of exercise/diet changes. Goal fasting Glc 110-140   Cholesterol:  Will add on Rx to help get LDL better an reduce risk of heart attack/stroke           Visit summary with medication list and pertinent instructions was printed for patient to review. All questions at time of visit were answered - patient instructed to contact office with any additional concerns or updates. ER/RTC precautions were reviewed with the patient.      Follow-up plan: Return in about 3 months (around 07/02/2020) for recheck labs - blood draw few days before visit! [name]

## 2020-04-02 ENCOUNTER — Telehealth: Payer: Self-pay | Admitting: *Deleted

## 2020-04-02 NOTE — Telephone Encounter (Signed)
PA submitted for Praluent.  Awaiting response.

## 2020-04-02 NOTE — Telephone Encounter (Signed)
Praluent approved, pharmacy notified.

## 2020-04-04 ENCOUNTER — Encounter: Payer: Self-pay | Admitting: Osteopathic Medicine

## 2020-04-05 ENCOUNTER — Encounter: Payer: Self-pay | Admitting: Osteopathic Medicine

## 2020-04-07 NOTE — Telephone Encounter (Signed)
See notes -  Praluent coverage should be good to go w/ intolerance to high potency statins and LDL >70 in diabetic, can we check on this? Thanks!

## 2020-04-09 ENCOUNTER — Ambulatory Visit (HOSPITAL_COMMUNITY)
Admission: RE | Admit: 2020-04-09 | Discharge: 2020-04-09 | Disposition: A | Discharge status: Home / Self Care | Payer: Medicare HMO | Source: Ambulatory Visit | Attending: Oncology | Admitting: Oncology

## 2020-04-09 ENCOUNTER — Other Ambulatory Visit: Payer: Self-pay

## 2020-04-09 ENCOUNTER — Encounter (HOSPITAL_COMMUNITY): Payer: Self-pay

## 2020-04-09 DIAGNOSIS — I700 Atherosclerosis of aorta: Secondary | ICD-10-CM | POA: Diagnosis not present

## 2020-04-09 DIAGNOSIS — J432 Centrilobular emphysema: Secondary | ICD-10-CM | POA: Diagnosis not present

## 2020-04-09 DIAGNOSIS — C3431 Malignant neoplasm of lower lobe, right bronchus or lung: Secondary | ICD-10-CM | POA: Insufficient documentation

## 2020-04-09 DIAGNOSIS — C3490 Malignant neoplasm of unspecified part of unspecified bronchus or lung: Secondary | ICD-10-CM | POA: Diagnosis not present

## 2020-04-09 DIAGNOSIS — Z85038 Personal history of other malignant neoplasm of large intestine: Secondary | ICD-10-CM | POA: Diagnosis not present

## 2020-04-12 ENCOUNTER — Inpatient Hospital Stay: Payer: Medicare HMO

## 2020-04-12 ENCOUNTER — Other Ambulatory Visit: Payer: Self-pay

## 2020-04-12 ENCOUNTER — Inpatient Hospital Stay: Payer: Medicare HMO | Attending: Oncology | Admitting: Oncology

## 2020-04-12 VITALS — BP 114/53 | HR 73 | Temp 97.9°F | Resp 16 | Ht 68.0 in | Wt 183.1 lb

## 2020-04-12 DIAGNOSIS — C3490 Malignant neoplasm of unspecified part of unspecified bronchus or lung: Secondary | ICD-10-CM | POA: Insufficient documentation

## 2020-04-12 DIAGNOSIS — C3431 Malignant neoplasm of lower lobe, right bronchus or lung: Secondary | ICD-10-CM

## 2020-04-12 DIAGNOSIS — Z23 Encounter for immunization: Secondary | ICD-10-CM | POA: Diagnosis not present

## 2020-04-12 DIAGNOSIS — C189 Malignant neoplasm of colon, unspecified: Secondary | ICD-10-CM | POA: Diagnosis not present

## 2020-04-12 NOTE — Progress Notes (Signed)
[facility] OFFICE PROGRESS NOTE   Diagnosis: Colon cancer, non-small cell lung cancer  INTERVAL HISTORY:   Kelli [name] returns as scheduled.  She feels well.  Good appetite and energy level.  No difficulty with bowel function.  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 She underwent a colonoscopy by Dr. Ardis Hughs 01/30/2020.  Polyps were removed from the rectum and descending colon.  The pathology revealed hyperplastic polyps.  Objective:  Vital signs in last 24 hours:  Blood pressure (!) 114/53, pulse 73, temperature 97.9 F (36.6 C), temperature source Tympanic, resp. rate 16, height 5' 8"  (1.727 m), weight 183 lb 1.6 oz (83.1 kg), SpO2 98 %.    HEENT: Neck without mass Lymphatics: No cervical, supraclavicular, axillary, or inguinal nodes Resp: Inspiratory rhonchi at the right lower posterior chest, no respiratory distress Cardio: Regular rate and rhythm, distant heart sounds GI: No hepatosplenomegaly Vascular: No leg edema   Lab Results:  Lab Results  Component Value Date   WBC 7.7 03/29/2020   HGB 12.8 03/29/2020   HCT 39.2 03/29/2020   MCV 94.2 03/29/2020   PLT 306 03/29/2020   NEUTROABS 4,319 08/13/2018       Imaging:  CT Chest Wo Contrast  Result Date: 04/10/2020 CLINICAL DATA:  Lung cancer restaging, status post right lower lobectomy, additional history of colon cancer EXAM: CT CHEST WITHOUT CONTRAST TECHNIQUE: Multidetector CT imaging of the chest was performed following the standard protocol without IV contrast. COMPARISON:  04/09/2019, 03/25/2018, 03/20/2017 FINDINGS: Cardiovascular: Aortic atherosclerosis. Normal heart size. No pericardial effusion. Mediastinum/Nodes: No enlarged mediastinal, hilar, or axillary lymph nodes. Thyroid gland, trachea, and esophagus demonstrate no significant findings. Lungs/Pleura: Stable postoperative findings status post right lower lobectomy. Mild centrilobular emphysema and diffuse bilateral  bronchial wall thickening. Multiple small bilateral pulmonary nodules, both solid and ground-glass composition, are stable compared to prior examinations. An index nodule of the superior segment right lower lobe measures 5 mm (series 7, image 84). A ground-glass opacity of the anterior left upper lobe measures 9 mm (series 7, image 49). No pleural effusion or pneumothorax. Upper Abdomen: No acute abnormality. Musculoskeletal: No chest wall mass or suspicious bone lesions identified. Calcified bilateral breast implants. IMPRESSION: 1. Stable postoperative findings status post right lower lobectomy. No evidence of malignant recurrence. 2. Multiple small bilateral pulmonary nodules, both solid and ground-glass composition, are stable compared to multiple prior examinations and almost certainly benign. Attention on follow-up. 3. Emphysema (ICD10-J43.9). 4. Aortic Atherosclerosis (ICD10-I70.0). Electronically Signed   By: Eddie Candle M.D.   On: 04/10/2020 18:15    Medications: I have reviewed the patient's current medications.   Assessment/Plan: 1. 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 History of colonic polyps-status post a surveillance colonoscopy in February 2016 with a tubular adenoma removed  Colonoscopy 01/30/2020-polyps removed from the rectum and descending colon, hyperplastic polyps 3. History of heavy tobacco use, quit in 2010 4. History of lung nodules-last imaging was an abdomen CT in October 2013 prior to repeat imaging 09/03/2014  CT 09/03/2014 revealed enlargement of a spiculated right lower lobe nodule  PET scan 09/16/2014 revealed low-level FDG activity associated with the right lower lobe spiculated nodule  Status post a right lower lobe basilar segmentectomy and mediastinal lymph node dissection on 10/06/2014 with the pathology confirming a minimally invasive well-differentiated  adenocarcinoma,pT1a,N0, invasive component measured less than 0.3 cm, with negative surgical margins  CT chest  09/15/2015 with no evidence of recurrent lung cancer, stable nodules except for a probable new 4 mm right lower lobe subpleural nodule  CT chest 06/19/2016-no evidence of recurrent lung cancer, stable lung nodules  CT chest 03/20/2017-stable lung nodules  CT chest 03/26/2018- stable lung nodules  CT chest 04/09/2019-stable lung nodules  CT chest 04/10/2019-stable lung nodules  5. Post thoracotomy pain-resolved  6.  Left humerus fracture following a fall, status post surgical repair 10/01/2018      Disposition: [name] is in remission from colon cancer and lung cancer.  She would like to continue follow-up at the Cancer center.  She will be scheduled for an office visit and surveillance chest CT in 1 year.  She received a COVID-19 booster vaccine today.  Betsy Coder, MD  04/12/2020  12:01 PM

## 2020-04-12 NOTE — Progress Notes (Signed)
° °  Covid-19 Vaccination Clinic  Name:  Kelli [name]    MRN: [id number] DOB: 21-Oct-1945  04/12/2020  [name] was observed post Covid-19 immunization for 15 minutes without incident. She was provided with Vaccine Information Sheet and instruction to access the V-Safe system.   [name] was instructed to call 911 with any severe reactions post vaccine:  Difficulty breathing   Swelling of face and throat   A fast heartbeat   A bad rash all over body   Dizziness and weakness

## 2020-04-13 ENCOUNTER — Telehealth: Payer: Self-pay | Admitting: Oncology

## 2020-04-13 NOTE — Telephone Encounter (Signed)
Scheduled appointment per 11/8 los. Called patient, no answer. Left message for patient with appointment date and time.

## 2020-04-19 ENCOUNTER — Other Ambulatory Visit: Payer: Self-pay

## 2020-04-19 ENCOUNTER — Encounter: Payer: Self-pay | Admitting: Family Medicine

## 2020-04-19 ENCOUNTER — Ambulatory Visit (INDEPENDENT_AMBULATORY_CARE_PROVIDER_SITE_OTHER): Payer: Medicare HMO | Admitting: Family Medicine

## 2020-04-19 ENCOUNTER — Ambulatory Visit: Payer: Self-pay

## 2020-04-19 VITALS — BP 134/78 | HR 82 | Ht 68.0 in | Wt 183.6 lb

## 2020-04-19 DIAGNOSIS — M25511 Pain in right shoulder: Secondary | ICD-10-CM

## 2020-04-19 DIAGNOSIS — G8929 Other chronic pain: Secondary | ICD-10-CM

## 2020-04-19 DIAGNOSIS — T50B95A Adverse effect of other viral vaccines, initial encounter: Secondary | ICD-10-CM

## 2020-04-19 DIAGNOSIS — M7551 Bursitis of right shoulder: Secondary | ICD-10-CM

## 2020-04-19 DIAGNOSIS — R52 Pain, unspecified: Secondary | ICD-10-CM

## 2020-04-19 NOTE — Patient Instructions (Signed)
Thank you for coming in today.  Call or go to the ER if you develop a large red swollen joint with extreme pain or oozing puss.  Let me know if this is not working.   Recheck with me as needed.   Please use voltaren gel up to 4x daily for pain as needed.

## 2020-04-19 NOTE — Progress Notes (Signed)
I, [name], LAT, ATC acting as a scribe for [name], MD.  Kelli [name] is a 74 y.o. female who presents to [place] [facility] today for R shoulder/arm pain. Pt was last seen by Dr. [name] on 11/21/19 and was given a repeat injection and advised to try PT, possible MRI, and surgery planning. No PT was completed. Today, pt reports arthritis in R shoulder. MOI: Pt reports she had the flu shot a few wks ago and 10 days after had COVID booster.  Covid vaccine was November 8.  Pt c/o shoulder pn increasing since these immunizations. Aggravates: Shoulder movt, Pt reports pn is waking her up several times throughout the night.  Rx tried: none Numbness/tingling: none   Dx imaging: 11/21/19 R shoulder  Pertinent review of systems: No fevers or chills  Relevant historical information: History left shoulder fracture   Exam:  BP 134/78 (BP Location: Right Arm, Patient Position: Sitting, Cuff Size: Normal)   Pulse 82   Ht 5\' 8"  (1.727 m)   Wt 183 lb 9.6 oz (83.3 kg)   LMP  (LMP Unknown)   SpO2 98%   BMI 27.92 kg/m  General: Well Developed, well nourished, and in no acute distress.   MSK: Right shoulder normal-appearing no erythema.  Mildly tender palpation at deltoid.  No nodules palpated. Decreased shoulder range of motion to abduction.    Lab and Radiology Results  Procedure: Real-time Ultrasound Guided Injection of right shoulder subacromial bursa Device: Philips Affiniti 50G Images permanently stored and available for review in PACS Inspection with ultrasound prior to injection reveals no significant abnormality around deltoid region of tenderness. Patient has a large subacromial/subdeltoid bursitis visible Verbal informed consent obtained.  Discussed risks and benefits of procedure. Warned about infection bleeding damage to structures skin hypopigmentation and fat atrophy among others. Patient expresses understanding and agreement Time-out  conducted.   Noted no overlying erythema, induration, or other signs of local infection.   Skin prepped in a sterile fashion.   Local anesthesia: Topical Ethyl chloride.   With sterile technique and under real time ultrasound guidance:  40 mg of Kenalog and 2 mL of Marcaine injected into subacromial bursa. Fluid seen entering the bursa.   Completed without difficulty   Pain partially resolved suggesting accurate placement of the medication.   Advised to call if fevers/chills, erythema, induration, drainage, or persistent bleeding.   Images permanently stored and available for review in the ultrasound unit.  Impression: Technically successful ultrasound guided injection.      Assessment and Plan: 74 y.o. female with right shoulder pain.  Patient had a recent flu and then Covid vaccine in the right arm.  Additionally she has a long history of shoulder rotator cuff dysfunction of subacromial bursitis.  On physical exam she does have some tenderness in the deltoid region where she had her vaccination however on ultrasound has a large subacromial bursitis which is likely the main cause of her shoulder pain.  I am not sure if the Covid vaccine is linked to the subacromial bursitis however there certainly is some association with time.  Plan for subacromial injection as she is had great results with that in the past and the systemic steroid component would likely help some of the deltoid pain that she is experiencing.  Resume home exercise program recheck back as needed.   PDMP not reviewed this encounter. Orders Placed This Encounter  Procedures  . [place] LIMITED JOINT SPACE STRUCTURES UP RIGHT(NO LINKED  CHARGES)    Standing Status:   Future    Number of Occurrences:   1    Standing Expiration Date:   10/17/2020    Order Specific Question:   Reason for Exam (SYMPTOM  OR DIAGNOSIS REQUIRED)    Answer:   chronic right shoulder pain    Order Specific Question:   Preferred imaging location?     Answer:   [facility]   No orders of the defined types were placed in this encounter.    Discussed warning signs or symptoms. Please see discharge instructions. Patient expresses understanding.   The above documentation has been reviewed and is accurate and complete [name], M.D.

## 2020-05-03 ENCOUNTER — Other Ambulatory Visit: Payer: Self-pay | Admitting: Osteopathic Medicine

## 2020-05-31 ENCOUNTER — Encounter: Payer: Self-pay | Admitting: Osteopathic Medicine

## 2020-06-01 ENCOUNTER — Telehealth (INDEPENDENT_AMBULATORY_CARE_PROVIDER_SITE_OTHER): Payer: Medicare HMO | Admitting: Osteopathic Medicine

## 2020-06-01 ENCOUNTER — Encounter: Payer: Self-pay | Admitting: Osteopathic Medicine

## 2020-06-01 VITALS — Temp 98.9°F

## 2020-06-01 DIAGNOSIS — Z20822 Contact with and (suspected) exposure to covid-19: Secondary | ICD-10-CM

## 2020-06-01 LAB — POCT INFLUENZA A/B
Influenza A, POC: NEGATIVE
Influenza B, POC: NEGATIVE

## 2020-06-01 NOTE — Progress Notes (Signed)
Telemedicine Visit via  Audio only - telephone (patient preference /  technical difficulty with MyChart video application)  I connected with Kelli [name] on 06/01/20 at 1:15 PM  by phone or  telemedicine application as noted above  I verified that I am speaking with or regarding  the correct patient using two identifiers.  Participants: Myself, Dr Emeterio Reeve DO Patient: Kelli [name] Patient proxy if applicable: none Other, if applicable: none  Patient is in separate location from myself  I am in office at [facility]    I discussed the limitations of evaluation and management  by telemedicine and the availability of in person appointments.  The participant(s) above expressed understanding and  agreed to proceed with this appointment via telemedicine.       History of Present Illness: Kelli [name] is a 74 y.o. female who would like to discuss feeling sick   Has 66 guests for christmas last week! All vaccinated.  Muscle aches Saturday 12/25 Sunday 12/26 fever to 101 Monday 12/27 had some chills Today 12/28 headache and some aches but overall better  Immunization History  Administered Date(s) Administered  . Fluad Quad(high Dose 65+) 03/13/2019, 04/01/2020  . Influenza,inj,Quad PF,6+ Mos 03/20/2018  . PFIZER SARS-COV-2 Vaccination 08/08/2019, 09/03/2019, 04/12/2020  . Pneumococcal Conjugate-13 08/22/2016  . Pneumococcal-Unspecified 02/03/2009  . Tdap 10/02/2018        Observations/Objective: Temp 98.9 F (37.2 C)   LMP  (LMP Unknown)  BP Readings from Last 3 Encounters:  04/19/20 134/78  04/12/20 (!) 114/53  04/01/20 129/73   Exam: Normal Speech.  NAD  Lab and Radiology Results Results for orders placed or performed in visit on 06/01/20 (from the past 72 hour(s))  POCT Influenza A/B     Status: Normal   Collection Time: 06/01/20  1:28 PM  Result Value Ref Range   Influenza A, POC Negative Negative   Influenza B, POC Negative  Negative   No results found.     Assessment and Plan: 74 y.o. female with The primary encounter diagnosis was Exposure to COVID-19 virus. A diagnosis of Suspected COVID-19 virus infection was also pertinent to this visit.  --> COVID and flu swab today, seems likely mild COVID illness in a vaccinated individual (thankfully).   PDMP not reviewed this encounter. Orders Placed This Encounter  Procedures  . Novel Coronavirus, NAA ([facility])    Order Specific Question:   Is this test for diagnosis or screening    Answer:   Diagnosis of ill patient    Order Specific Question:   Symptomatic for COVID-19 as defined by CDC    Answer:   Yes    Order Specific Question:   Date of Symptom Onset    Answer:   06/01/2020    Order Specific Question:   Hospitalized for COVID-19    Answer:   No    Order Specific Question:   Admitted to ICU for COVID-19    Answer:   No    Order Specific Question:   Previously tested for COVID-19    Answer:   Yes    Order Specific Question:   Resident in a congregate (group) care setting    Answer:   No    Order Specific Question:   Is the patient student?    Answer:   No    Order Specific Question:   Employed in healthcare setting    Answer:   No    Order Specific Question:   Pregnant  Answer:   No    Order Specific Question:   Has patient completed COVID vaccination(s) (2 doses of Pfizer/Moderna 1 dose of [employer])    Answer:   Yes    Order Specific Question:   Release to patient    Answer:   Immediate  . POCT Influenza A/B   No orders of the defined types were placed in this encounter.  There are no Patient Instructions on file for this visit.  Instructions sent via MyChart.   Follow Up Instructions: Return for RECHECK PENDING RESULTS / IF WORSE OR CHANGE.    I discussed the assessment and treatment plan with the patient. The patient was provided an opportunity to ask questions and all were answered. The patient agreed with the plan and  demonstrated an understanding of the instructions.   The patient was advised to call back or seek an in-person evaluation if any new concerns, if symptoms worsen or if the condition fails to improve as anticipated.  21 minutes of non-face-to-face time was provided during this encounter.      . . . . . . . . . . . . . [name]                   Historical information moved to improve visibility of documentation.  Past Medical History:  Diagnosis Date  . Anxiety   . Arthritis   . Borderline diabetic   . Colon cancer ([place])    colon ca dx 02/2009, lung cancer  . Depression   . Depression with anxiety 09/19/2011  . H/O colon cancer, stage II 09/19/2011   T3N0  [place] Sept 2010  . High cholesterol   . Hyperlipidemia 09/19/2011  . Lung nodules 09/19/2011   granulomas  . Urinary urgency    Past Surgical History:  Procedure Laterality Date  . APPENDECTOMY    . AUGMENTATION MAMMAPLASTY    . BREAST SURGERY  1978   augmentation  . COLON SURGERY     2010  . CRYO INTERCOSTAL NERVE BLOCK N/A 10/05/2014   Procedure: CRYO INTERCOSTAL NERVE BLOCK;  Surgeon: [name], MD;  Location: [place];  Service: Thoracic;  Laterality: N/A;  . LYMPH NODE DISSECTION Right 10/05/2014   Procedure: LYMPH NODE DISSECTION;  Surgeon: [name], MD;  Location: [place];  Service: Thoracic;  Laterality: Right;  . ORIF HUMERUS FRACTURE Left 10/01/2018   Procedure: OPEN REDUCTION INTERNAL FIXATION (ORIF) LEFT PROXIMAL HUMERUS FRACTURE;  Surgeon: [name], MD;  Location: [place];  Service: Orthopedics;  Laterality: Left;  . PARTIAL COLECTOMY Right 2010  . SEGMENTECOMY Right 10/05/2014   Procedure: right lower lobe SEGMENTECTOMY;  Surgeon: [name], MD;  Location: [place];  Service: Thoracic;  Laterality: Right;  [name] VIDEO ASSISTED THORACOSCOPY (VATS)/WEDGE RESECTION Right 10/05/2014   Procedure: Right VIDEO ASSISTED THORACOSCOPY (VATS) with right lower lobe lung nodule  WEDGE RESECTION;  Surgeon: [name], MD;  Location: [place];  Service: Thoracic;  Laterality: Right;   Social History   Tobacco Use  . Smoking status: Former Smoker    Packs/day: 3.00    Years: 40.00    Pack years: 120.00    Types: Cigarettes    Quit date: 02/06/2009    Years since quitting: 11.3  . Smokeless tobacco: Never Used  Substance Use Topics  . Alcohol use: Yes    Alcohol/week: 0.0 standard drinks    Comment: 3-4 times a week 1-2 beers   family  history includes Colon polyps in her sister; Heart disease in her brother and mother.  Medications: Current Outpatient Medications  Medication Sig Dispense Refill  . AMBULATORY NON FORMULARY MEDICATION Single glucometer with lancets, test strips. Test daily.  Disp qs 3 months E11.9 1 each 0  . B Complex-C (B-COMPLEX WITH VITAMIN C) tablet Take 1 tablet by mouth daily.    . B-D UF III MINI PEN NEEDLES 31G X 5 MM MISC USE AS DIRECTED 100 each 12  . Cholecalciferol (VITAMIN D) 2000 UNITS tablet Take 2,000 Units by mouth daily.    . cloNIDine (CATAPRES) 0.1 MG tablet Take 1 tablet (0.1 mg total) by mouth at bedtime. 90 tablet 3  . FLUoxetine (PROZAC) 40 MG capsule Take 1 capsule (40 mg total) by mouth daily. 90 capsule 3  . gabapentin (NEURONTIN) 300 MG capsule TAKE 1 CAPSULE (300 MG TOTAL) BY MOUTH AT BEDTIME AS NEEDED (INSOMNIA). 90 capsule 3  . Insulin Glargine (BASAGLAR KWIKPEN) 100 UNIT/ML 10 UNITS AT BED INCREASE BY 2 UNITS EVERY 3 DAYS TIL FASTING SUGAR GOAL 120-150 MAX 60 UNITS PER DAY 3 mL 11  . OneTouch Delica Lancets 43X MISC TEST BLOOD SUGARS DAILY 100 each 0  . ONETOUCH VERIO test strip TEST BLOOD SUGARS DAILY 100 strip 1  . pravastatin (PRAVACHOL) 80 MG tablet Take 1 tablet (80 mg total) by mouth daily. 90 tablet 3   No current facility-administered medications for this visit.   Allergies  Allergen Reactions  . Atorvastatin Other (See Comments)    Muscle cramps  . Glipizide Er Other (See Comments)     Shaking / tingling in fingers

## 2020-06-03 LAB — NOVEL CORONAVIRUS, NAA: SARS-CoV-2, NAA: NOT DETECTED

## 2020-06-03 LAB — SARS-COV-2, NAA 2 DAY TAT

## 2020-06-05 DIAGNOSIS — M810 Age-related osteoporosis without current pathological fracture: Secondary | ICD-10-CM

## 2020-06-05 HISTORY — DX: Age-related osteoporosis without current pathological fracture: M81.0

## 2020-06-08 DIAGNOSIS — G4700 Insomnia, unspecified: Secondary | ICD-10-CM | POA: Diagnosis not present

## 2020-06-08 DIAGNOSIS — Z823 Family history of stroke: Secondary | ICD-10-CM | POA: Diagnosis not present

## 2020-06-08 DIAGNOSIS — M1990 Unspecified osteoarthritis, unspecified site: Secondary | ICD-10-CM | POA: Diagnosis not present

## 2020-06-08 DIAGNOSIS — R69 Illness, unspecified: Secondary | ICD-10-CM | POA: Diagnosis not present

## 2020-06-08 DIAGNOSIS — Z008 Encounter for other general examination: Secondary | ICD-10-CM | POA: Diagnosis not present

## 2020-06-08 DIAGNOSIS — Z809 Family history of malignant neoplasm, unspecified: Secondary | ICD-10-CM | POA: Diagnosis not present

## 2020-06-08 DIAGNOSIS — Z794 Long term (current) use of insulin: Secondary | ICD-10-CM | POA: Diagnosis not present

## 2020-06-08 DIAGNOSIS — I252 Old myocardial infarction: Secondary | ICD-10-CM | POA: Diagnosis not present

## 2020-06-08 DIAGNOSIS — E785 Hyperlipidemia, unspecified: Secondary | ICD-10-CM | POA: Diagnosis not present

## 2020-06-08 DIAGNOSIS — E119 Type 2 diabetes mellitus without complications: Secondary | ICD-10-CM | POA: Diagnosis not present

## 2020-06-08 DIAGNOSIS — Z85118 Personal history of other malignant neoplasm of bronchus and lung: Secondary | ICD-10-CM | POA: Diagnosis not present

## 2020-06-28 ENCOUNTER — Other Ambulatory Visit: Payer: Self-pay

## 2020-06-28 ENCOUNTER — Other Ambulatory Visit: Payer: Self-pay | Admitting: Osteopathic Medicine

## 2020-06-28 ENCOUNTER — Ambulatory Visit
Admission: RE | Admit: 2020-06-28 | Discharge: 2020-06-28 | Disposition: A | Discharge status: Home / Self Care | Payer: Medicare HMO | Source: Ambulatory Visit | Attending: Osteopathic Medicine | Admitting: Osteopathic Medicine

## 2020-06-28 DIAGNOSIS — N6322 Unspecified lump in the left breast, upper inner quadrant: Secondary | ICD-10-CM | POA: Diagnosis not present

## 2020-06-28 DIAGNOSIS — R928 Other abnormal and inconclusive findings on diagnostic imaging of breast: Secondary | ICD-10-CM

## 2020-06-28 DIAGNOSIS — N6321 Unspecified lump in the left breast, upper outer quadrant: Secondary | ICD-10-CM | POA: Diagnosis not present

## 2020-06-30 ENCOUNTER — Other Ambulatory Visit: Payer: Self-pay

## 2020-06-30 ENCOUNTER — Encounter: Payer: Self-pay | Admitting: Osteopathic Medicine

## 2020-06-30 ENCOUNTER — Ambulatory Visit (INDEPENDENT_AMBULATORY_CARE_PROVIDER_SITE_OTHER): Payer: Medicare HMO | Admitting: Osteopathic Medicine

## 2020-06-30 VITALS — BP 109/68 | HR 72 | Wt 182.0 lb

## 2020-06-30 DIAGNOSIS — E119 Type 2 diabetes mellitus without complications: Secondary | ICD-10-CM | POA: Diagnosis not present

## 2020-06-30 DIAGNOSIS — Z794 Long term (current) use of insulin: Secondary | ICD-10-CM | POA: Diagnosis not present

## 2020-06-30 DIAGNOSIS — E782 Mixed hyperlipidemia: Secondary | ICD-10-CM | POA: Diagnosis not present

## 2020-06-30 LAB — POCT GLYCOSYLATED HEMOGLOBIN (HGB A1C): Hemoglobin A1C: 8 % — AB (ref 4.0–5.6)

## 2020-06-30 NOTE — Progress Notes (Signed)
HPI: Kelli [name] is a 75 y.o. female who  has a past medical history of Anxiety, Arthritis, Borderline diabetic, Colon cancer ([place]), Depression, Depression with anxiety (09/19/2011), H/O colon cancer, stage II (09/19/2011), High cholesterol, Hyperlipidemia (09/19/2011), Lung nodules (09/19/2011), and Urinary urgency.  she presents to [facility] today, 06/30/20,  for chief complaint of:  Diabetes Follow up  A1C 03/2020 was 8.5%; insulin was up-titrated by 2 units until fasting blood sugar was 110-140. Now taking 30 units nightly, and fasting blood sugars are in 130s unless she has sweets or a beer then it is in 150s.  A1C today 8.0%. Denies episodes of hypoglycemia,   Hyperlipidemia On 80 mg pravastatin. PSCK-9 inhibitor (praluent) was planned to be added in 03/2020, but was not affordable at that time for pt.   Spot on leg Pt has an itchy raise papule on her outer L calf. It has been there for at least a month and has increased in size. It is itchy and hurts when scratched. It has not bled. Consistent with squamous cell carcinoma; has hx of squamous cell carcinoma removal on other leg.    Past medical, surgical, social and family history reviewed:  Patient Active Problem List   Diagnosis Date Noted  . Closed fracture of left proximal humerus 09/26/2018  . Squamous cell carcinoma 03/05/2017  . Echocardiogram shows left ventricular diastolic dysfunction 69/48/5462  . Heart palpitations 07/25/2016  . Need for vaccination for pneumococcus 07/13/2016  . Osteoarthritis of hand, right 08/06/2015  . Diabetes mellitus type 2, uncomplicated ([place]) 70/35/0093  . Lung cancer, lower lobe ([place]) 10/09/2014  . Hematochezia 04/01/2013  . H/O colon cancer, stage II 09/19/2011  . Depression with anxiety 09/19/2011  . Hyperlipidemia 09/19/2011    Past Surgical History:  Procedure Laterality Date  . APPENDECTOMY    . AUGMENTATION MAMMAPLASTY    . BREAST SURGERY   1978   augmentation  . COLON SURGERY     2010  . CRYO INTERCOSTAL NERVE BLOCK N/A 10/05/2014   Procedure: CRYO INTERCOSTAL NERVE BLOCK;  Surgeon: [name], MD;  Location: [place];  Service: Thoracic;  Laterality: N/A;  . LYMPH NODE DISSECTION Right 10/05/2014   Procedure: LYMPH NODE DISSECTION;  Surgeon: [name], MD;  Location: [place];  Service: Thoracic;  Laterality: Right;  . ORIF HUMERUS FRACTURE Left 10/01/2018   Procedure: OPEN REDUCTION INTERNAL FIXATION (ORIF) LEFT PROXIMAL HUMERUS FRACTURE;  Surgeon: [name], MD;  Location: [place];  Service: Orthopedics;  Laterality: Left;  . PARTIAL COLECTOMY Right 2010  . SEGMENTECOMY Right 10/05/2014   Procedure: right lower lobe SEGMENTECTOMY;  Surgeon: [name], MD;  Location: [place];  Service: Thoracic;  Laterality: Right;  [name] VIDEO ASSISTED THORACOSCOPY (VATS)/WEDGE RESECTION Right 10/05/2014   Procedure: Right VIDEO ASSISTED THORACOSCOPY (VATS) with right lower lobe lung nodule WEDGE RESECTION;  Surgeon: [name], MD;  Location: [place];  Service: Thoracic;  Laterality: Right;    Social History   Tobacco Use  . Smoking status: Former Smoker    Packs/day: 3.00    Years: 40.00    Pack years: 120.00    Types: Cigarettes    Quit date: 02/06/2009    Years since quitting: 11.4  . Smokeless tobacco: Never Used  Substance Use Topics  . Alcohol use: Yes    Alcohol/week: 0.0 standard drinks    Comment: 3-4 times a week 1-2 beers    Family History  Problem Relation  Age of Onset  . Heart disease Mother   . Colon polyps Sister   . Heart disease Brother   . Colon cancer Neg Hx   . Esophageal cancer Neg Hx   . Stomach cancer Neg Hx   . Rectal cancer Neg Hx      Current medication list and allergy/intolerance information reviewed:    Current Outpatient Medications  Medication Sig Dispense Refill  . AMBULATORY NON FORMULARY MEDICATION Single glucometer with lancets, test strips. Test daily.   Disp qs 3 months E11.9 1 each 0  . B Complex-C (B-COMPLEX WITH VITAMIN C) tablet Take 1 tablet by mouth daily.    . B-D UF III MINI PEN NEEDLES 31G X 5 MM MISC USE AS DIRECTED 100 each 12  . Cholecalciferol (VITAMIN D) 2000 UNITS tablet Take 2,000 Units by mouth daily.    . cloNIDine (CATAPRES) 0.1 MG tablet Take 1 tablet (0.1 mg total) by mouth at bedtime. 90 tablet 3  . FLUoxetine (PROZAC) 40 MG capsule Take 1 capsule (40 mg total) by mouth daily. 90 capsule 3  . gabapentin (NEURONTIN) 300 MG capsule TAKE 1 CAPSULE (300 MG TOTAL) BY MOUTH AT BEDTIME AS NEEDED (INSOMNIA). 90 capsule 3  . Insulin Glargine (BASAGLAR KWIKPEN) 100 UNIT/ML 10 UNITS AT BED INCREASE BY 2 UNITS EVERY 3 DAYS TIL FASTING SUGAR GOAL 120-150 MAX 60 UNITS PER DAY 3 mL 11  . OneTouch Delica Lancets 16X MISC TEST BLOOD SUGARS DAILY 100 each 0  . ONETOUCH VERIO test strip TEST BLOOD SUGARS DAILY 100 strip 1  . pravastatin (PRAVACHOL) 80 MG tablet Take 1 tablet (80 mg total) by mouth daily. 90 tablet 3   No current facility-administered medications for this visit.    Allergies  Allergen Reactions  . Atorvastatin Other (See Comments)    Muscle cramps  . Glipizide Er Other (See Comments)    Shaking / tingling in fingers       Review of Systems:  Constitutional: No recent illness, No unintentional weight changes.  Cardiac: No  chest pain, No  pressure, No palpitations  Respiratory:  No  shortness of breath.   Endocrine:  No polyuria/polydipsia/polyphagia   Skin: raised lesion on L calf  Psychiatric: No  concerns with depression, No  concerns with anxiety, No sleep problems, No mood problems  Exam:  BP 109/68   Pulse 72   Wt 182 lb 0.6 oz (82.6 kg)   LMP  (LMP Unknown)   SpO2 96%   BMI 27.68 kg/m   Constitutional: VS see above. General Appearance: alert, well-developed, well-nourished, NAD  Respiratory: Normal respiratory effort. no wheeze, no rhonchi, no rales  Cardiovascular: S1/S2 normal, no  murmur, no rub/gallop auscultated. RRR. No lower extremity edema.   Musculoskeletal: Gait normal.   Neurological: Normal balance/coordination. No tremor.   Skin: warm, dry, intact. Raised papule on L outer calf that is consistent with squamous cell carcinoma  Psychiatric: Normal judgment/insight. Normal mood and affect.    Results for orders placed or performed in visit on 06/30/20 (from the past 72 hour(s))  POCT glycosylated hemoglobin (Hb A1C)     Status: Abnormal   Collection Time: 06/30/20  3:11 PM  Result Value Ref Range   Hemoglobin A1C 8.0 (A) 4.0 - 5.6 %   HbA1c POC (<> result, manual entry)     HbA1c, POC (prediabetic range)     HbA1c, POC (controlled diabetic range)      US BREAST LTD UNI LEFT INC AXILLA  Result  Date: 06/28/2020 CLINICAL DATA:  75 year old female for six-month follow-up of LEFT breast mass. EXAM: ULTRASOUND OF THE LEFT BREAST COMPARISON:  Previous exam(s). FINDINGS: Targeted ultrasound is performed, showing a stable 0.6 x 0.3 x 0.7 cm circumscribed oval hypoechoic parallel mass at the 12 o'clock position of the LEFT breast 3 cm from the nipple. IMPRESSION: 1. Stable 0.7 cm likely benign mass within the UPPER LEFT breast. Six-month follow-up recommended to ensure 1 year stability. RECOMMENDATION: Bilateral diagnostic mammogram and LEFT breast ultrasound in 6 months. I have discussed the findings and recommendations with the patient. If applicable, a reminder letter will be sent to the patient regarding the next appointment. BI-RADS CATEGORY  3: Probably benign. Electronically Signed   By: Margarette Canada M.D.   On: 06/28/2020 13:51     ASSESSMENT/PLAN: The primary encounter diagnosis was Type 2 diabetes mellitus without complication, with long-term current use of insulin ([place]). A diagnosis of Mixed hyperlipidemia was also pertinent to this visit.   Skin lesion   Appears consistent with squamous cell   Will remove lesion 07/06/2020  Diabetes  Increase  insulin glargine by 2 units every 2-3 days until taking 35 units nightly  A1C today 8.0%  Hyperlipidemia  Continue pravastatin 80 mg daily - intolerant to higher potency statins   Lipid panel; will consider additional medication pending results   Lifestyle changes discussed   Orders Placed This Encounter  Procedures  . POCT glycosylated hemoglobin (Hb A1C)    No orders of the defined types were placed in this encounter.   There are no Patient Instructions on file for this visit.      Visit summary with medication list and pertinent instructions was printed for patient to review. All questions at time of visit were answered - patient instructed to contact office with any additional concerns or updates. ER/RTC precautions were reviewed with the patient.    Please note: voice recognition software was used to produce this document, and typos may escape review. Please contact Dr. Sheppard Coil for any needed clarifications.     Follow-up plan: Return for skin procedure 07/06/20, recheck A1C / lipids in 3 mos from now .

## 2020-07-06 ENCOUNTER — Other Ambulatory Visit: Payer: Self-pay | Admitting: Osteopathic Medicine

## 2020-07-06 ENCOUNTER — Other Ambulatory Visit: Payer: Self-pay

## 2020-07-06 ENCOUNTER — Ambulatory Visit (INDEPENDENT_AMBULATORY_CARE_PROVIDER_SITE_OTHER): Payer: Medicare HMO | Admitting: Osteopathic Medicine

## 2020-07-06 ENCOUNTER — Encounter: Payer: Self-pay | Admitting: Osteopathic Medicine

## 2020-07-06 VITALS — BP 122/74 | HR 78 | Wt 182.1 lb

## 2020-07-06 DIAGNOSIS — L989 Disorder of the skin and subcutaneous tissue, unspecified: Secondary | ICD-10-CM

## 2020-07-06 DIAGNOSIS — C44729 Squamous cell carcinoma of skin of left lower limb, including hip: Secondary | ICD-10-CM | POA: Diagnosis not present

## 2020-07-06 DIAGNOSIS — C44709 Unspecified malignant neoplasm of skin of left lower limb, including hip: Secondary | ICD-10-CM | POA: Diagnosis not present

## 2020-07-06 NOTE — Progress Notes (Signed)
  PRE-OP DIAGNOSIS: Abnormal Skin Lesion POST-OP DIAGNOSIS: Same  PROCEDURE: skin biopsy Performing Physician: Emeterio Reeve   Excisional Biopsy    The area surrounding the skin lesion was prepared and draped in the usual sterile manner.  Area 2 x 3 cm anesthetized with 3 cc lidocaine/epinephrine.  Diamond-shaped incision was made in the skin allowing for adequate margins around the lesion.  The lesion was removed with scalpel.  3 subcutaneous sutures were placed and skin was closed with Vicryl horizontal mattress and simple interrupted sutures. The patient tolerated the procedure well.   Closure:      suture    Followup: The patient tolerated the procedure well without complications.  Standard post-procedure care is explained and return precautions are given.

## 2020-07-13 ENCOUNTER — Encounter: Payer: Self-pay | Admitting: Osteopathic Medicine

## 2020-07-13 ENCOUNTER — Ambulatory Visit (INDEPENDENT_AMBULATORY_CARE_PROVIDER_SITE_OTHER): Payer: Medicare HMO | Admitting: Osteopathic Medicine

## 2020-07-13 ENCOUNTER — Other Ambulatory Visit: Payer: Self-pay

## 2020-07-13 VITALS — BP 120/64 | HR 80 | Wt 181.0 lb

## 2020-07-13 DIAGNOSIS — E119 Type 2 diabetes mellitus without complications: Secondary | ICD-10-CM

## 2020-07-13 DIAGNOSIS — Z4802 Encounter for removal of sutures: Secondary | ICD-10-CM

## 2020-07-13 NOTE — Progress Notes (Signed)
HPI: Kelli [name] is a 75 y.o. female who  has a past medical history of Anxiety, Arthritis, Borderline diabetic, Colon cancer ([place]), Depression, Depression with anxiety (09/19/2011), H/O colon cancer, stage II (09/19/2011), High cholesterol, Hyperlipidemia (09/19/2011), Lung nodules (09/19/2011), and Urinary urgency.  she presents to [facility] today, 07/13/20,  for chief complaint of:   Wound check and suture removal Pt had excisional biopsy of 2 cm x 3 cm skin lesion on lateral L lower leg on 07/06/2020 (7 days ago). Pathology of surgical specimen still pending. We called today to check up on status, pathologist hasn't yet reviewed specimen   All sutures were removed today and wound dressed with steri strips. Wound showed good healing with no exudate or other evidence of infection.    ASSESSMENT/PLAN: The primary encounter diagnosis was Visit for suture removal. A diagnosis of Type 2 diabetes mellitus without complication, without long-term current use of insulin ([place]) was also pertinent to this visit.   Skin lesion  Will follow up with patient regarding pathology results when pathology completed  Other wise follow up in 2 months (around April 26th) for A1C and lipid panel   Orders Placed This Encounter  Procedures  . CBC  . COMPLETE METABOLIC PANEL WITH GFR  . Lipid panel  . Hemoglobin A1c     No orders of the defined types were placed in this encounter.   There are no Patient Instructions on file for this visit.    Follow-up plan: Return in about 3 months (around 10/10/2020) for 2-3 mos to monitor A1C, labs prior to visit - orders are in! .    Current Meds  Medication Sig  . AMBULATORY NON FORMULARY MEDICATION Single glucometer with lancets, test strips. Test daily.  Disp qs 3 months E11.9  . B Complex-C (B-COMPLEX WITH VITAMIN C) tablet Take 1 tablet by mouth daily.  . B-D UF III MINI PEN NEEDLES 31G X 5 MM MISC USE AS DIRECTED  .  Cholecalciferol (VITAMIN D) 2000 UNITS tablet Take 2,000 Units by mouth daily.  . cloNIDine (CATAPRES) 0.1 MG tablet Take 1 tablet (0.1 mg total) by mouth at bedtime.  [name] FLUoxetine (PROZAC) 40 MG capsule Take 1 capsule (40 mg total) by mouth daily.  [name] gabapentin (NEURONTIN) 300 MG capsule TAKE 1 CAPSULE (300 MG TOTAL) BY MOUTH AT BEDTIME AS NEEDED (INSOMNIA).  . Insulin Glargine (BASAGLAR KWIKPEN) 100 UNIT/ML 10 UNITS AT BED INCREASE BY 2 UNITS EVERY 3 DAYS TIL FASTING SUGAR GOAL 120-150 MAX 60 UNITS PER DAY  . OneTouch Delica Lancets 70W MISC TEST BLOOD SUGARS DAILY  . ONETOUCH VERIO test strip TEST BLOOD SUGARS DAILY  . pravastatin (PRAVACHOL) 80 MG tablet Take 1 tablet (80 mg total) by mouth daily.    Allergies  Allergen Reactions  . Atorvastatin Other (See Comments)    Muscle cramps  . Glipizide Er Other (See Comments)    Shaking / tingling in fingers        Review of Systems: Pertinent (+) and (-) ROS in HPI as above        Visit summary with medication list and pertinent instructions was printed for patient to review, patient was advised to alert [place] if any updates are needed. All questions at time of visit were answered - patient instructed to contact office with any additional concerns. ER/RTC precautions were reviewed with the patient and understanding verbalized.       Please note: voice recognition software was used  to produce this document, and typos may escape review. Please contact Dr. Sheppard Coil for any needed clarifications.    Follow up plan: Return in about 3 months (around 10/10/2020) for 2-3 mos to monitor A1C, labs prior to visit - orders are in! [name]

## 2020-07-19 NOTE — Progress Notes (Unsigned)
I, [name], LAT, ATC acting as a scribe for [name], MD.  Kelli [name] is a 75 y.o. female who presents to [place] at [facility] today for f/u of R shoulder/arm pain. Pt was last seen by Dr. [name] on 04/19/20 for chronic R shoulder pain and had a R subacromial injection. Today, she reports increased pain w/ R shoulder ABD. Pt notes some relief from the last steroid injection, but relief is not lasting as long as it used to.  Radiates: yes- into mid-humerus Numbness/tingling: no  Dx imaging: 11/21/19 R shoulder XR  Pertinent review of systems: No fevers or chills  Relevant historical information: History of lung cancer, diabetes   Exam:  BP 118/76 (BP Location: Right Arm, Patient Position: Sitting, Cuff Size: Normal)   Pulse 72   Ht 5\' 8"  (1.727 m)   Wt 185 lb 6.4 oz (84.1 kg)   LMP  (LMP Unknown)   SpO2 97%   BMI 28.19 kg/m  General: Well Developed, well nourished, and in no acute distress.   MSK: Right shoulder normal-appearing Range of motion decreased abduction. Strength intact. Positive Hawkins and Neer's test.    Lab and Radiology Results  Procedure: Real-time Ultrasound Guided Injection of right shoulder subacromial bursa Device: Philips Affiniti 50G Images permanently stored and available for review in PACS Verbal informed consent obtained.  Discussed risks and benefits of procedure. Warned about infection bleeding damage to structures skin hypopigmentation and fat atrophy among others. Patient expresses understanding and agreement Time-out conducted.   Noted no overlying erythema, induration, or other signs of local infection.   Skin prepped in a sterile fashion.   Local anesthesia: Topical Ethyl chloride.   With sterile technique and under real time ultrasound guidance:  40 mg of Kenalog and 2 mL of Marcaine injected into right shoulder subacromial bursa. Fluid seen entering the bursa.   Completed without difficulty   Pain  immediately resolved suggesting accurate placement of the medication.   Advised to call if fevers/chills, erythema, induration, drainage, or persistent bleeding.   Images permanently stored and available for review in the ultrasound unit.  Impression: Technically successful ultrasound guided injection.     EXAM: RIGHT SHOULDER - 2+ VIEW  COMPARISON:  03/09/2016  FINDINGS: Exam demonstrates mild degenerative changes of the glenohumeral joint and AC joints. No evidence of acute fracture or dislocation.  IMPRESSION: No acute findings.  Mild degenerative changes as described.   Electronically Signed   By: Marin Olp M.D.   On: 11/21/2019 20:31  I, [name], personally (independently) visualized and performed the interpretation of the images attached in this note.      Assessment and Plan: 75 y.o. female with right shoulder pain thought to be due to rotator cuff tendinopathy and subacromial bursitis.  Patient has had trials of conservative management over the last year including injection and home exercise program.  Fundamentally she has worsening.  At this point she is willing to proceed with surgery if needed.  Plan to proceed with MRI for potential surgical planning.  Recheck following MRI.  We will proceed with injection today to buy her some time.   PDMP not reviewed this encounter. Orders Placed This Encounter  Procedures  . [place] LIMITED JOINT SPACE STRUCTURES UP RIGHT(NO LINKED CHARGES)    Standing Status:   Future    Number of Occurrences:   1    Standing Expiration Date:   01/17/2021    Order Specific Question:   Reason  for Exam (SYMPTOM  OR DIAGNOSIS REQUIRED)    Answer:   chronic right shoulder pain    Order Specific Question:   Preferred imaging location?    Answer:   [place]  . MR SHOULDER RIGHT WO CONTRAST    Standing Status:   Future    Standing Expiration Date:   07/20/2021    Order Specific Question:   What is the  patient's sedation requirement?    Answer:   No Sedation    Order Specific Question:   Does the patient have a pacemaker or implanted devices?    Answer:   No    Order Specific Question:   Preferred imaging location?    Answer:   [facility] (table limit-350lbs)   No orders of the defined types were placed in this encounter.    Discussed warning signs or symptoms. Please see discharge instructions. Patient expresses understanding.   The above documentation has been reviewed and is accurate and complete [name], M.D.

## 2020-07-20 ENCOUNTER — Other Ambulatory Visit: Payer: Self-pay

## 2020-07-20 ENCOUNTER — Ambulatory Visit: Payer: Self-pay

## 2020-07-20 ENCOUNTER — Ambulatory Visit: Payer: Medicare HMO | Admitting: Family Medicine

## 2020-07-20 VITALS — BP 118/76 | HR 72 | Ht 68.0 in | Wt 185.4 lb

## 2020-07-20 DIAGNOSIS — G8929 Other chronic pain: Secondary | ICD-10-CM

## 2020-07-20 DIAGNOSIS — M25511 Pain in right shoulder: Secondary | ICD-10-CM

## 2020-07-20 NOTE — Patient Instructions (Signed)
Thank you for coming in today.  Call or go to the ER if you develop a large red swollen joint with extreme pain or oozing puss.   You should hear from MRI scheduling within 1 week. If you do not hear please let me know.   Recheck following MRI.

## 2020-07-26 ENCOUNTER — Other Ambulatory Visit: Payer: Self-pay

## 2020-07-26 ENCOUNTER — Ambulatory Visit (INDEPENDENT_AMBULATORY_CARE_PROVIDER_SITE_OTHER): Payer: Medicare HMO

## 2020-07-26 DIAGNOSIS — S46011A Strain of muscle(s) and tendon(s) of the rotator cuff of right shoulder, initial encounter: Secondary | ICD-10-CM

## 2020-07-26 DIAGNOSIS — G8929 Other chronic pain: Secondary | ICD-10-CM

## 2020-07-26 DIAGNOSIS — M67813 Other specified disorders of tendon, right shoulder: Secondary | ICD-10-CM | POA: Diagnosis not present

## 2020-07-26 DIAGNOSIS — M25511 Pain in right shoulder: Secondary | ICD-10-CM | POA: Diagnosis not present

## 2020-07-27 ENCOUNTER — Encounter: Payer: Self-pay | Admitting: Family Medicine

## 2020-07-28 NOTE — Progress Notes (Signed)
I, Kelli [name], LAT, ATC, am serving as scribe for Dr. Lynne [name].  Kelli [name] is a 75 y.o. female who presents to [place] at [facility] today for f/u chronic R shoulder/arm pain. Pt was last seen by by Dr. Georgina [name] on 07/20/20 and was given a steroid injection and was referred for an MRI. Today, pt reports that her R shoulder is feeling improved after her injection at her last visit.  She demonstrates full R shoulder AROM.  Dx imaging: 07/26/20 R shoulder MRI  11/21/19 R shoulder XR  Pertinent review of systems: No fevers or chills  Relevant historical information: History lung cancer, diabetes   Exam:  BP 138/70 (BP Location: Right Arm, Patient Position: Sitting, Cuff Size: Normal)   Pulse 69   Ht 5\' 8"  (1.727 m)   Wt 183 lb (83 kg)   LMP  (LMP Unknown)   SpO2 99%   BMI 27.83 kg/m  General: Well Developed, well nourished, and in no acute distress.   MSK: Right shoulder normal-appearing normal motion nontender    Lab and Radiology Results No results found for this or any previous visit (from the past 72 hour(s)). MR SHOULDER RIGHT WO CONTRAST  Result Date: 07/27/2020 CLINICAL DATA:  Right shoulder pain EXAM: MRI OF THE RIGHT SHOULDER WITHOUT CONTRAST TECHNIQUE: Multiplanar, multisequence MR imaging of the shoulder was performed. No intravenous contrast was administered. COMPARISON:  None. FINDINGS: Rotator cuff: There is a complete focal full-thickness tear of the superior subscapularis tendon measuring approximately 1.5 cm in length. The inferior portion of the subscapularis tendon appears to be intact. there is increased globular signal thickening seen throughout the remainder of the supraspinatus and infraspinatus tendon. The muscles of the rotator cuff are normal without tear, edema, or atrophy. Muscles: The muscles other than the rotator cuff are normal without tear, edema, or atrophy. Biceps Long Head: The Intraarticular and extraarticular portions of  the biceps tendon are normal in position, size and signal. Acromioclavicular Joint: Moderate to advanced AC joint arthrosis seen with joint space loss capsular hypertrophy and adjacent reactive marrow. Type II acromion. Glenohumeral Joint: The glenohumeral joint alignment is well maintained. The there is mild chondral thinning seen at the superior glenohumeral articulation. A small glenohumeral joint effusion is seen. Labrum: There is attenuation of the superior labrum with blunting of the posterosuperior labrum. No displaced labral tear is seen. Bones: No fracture, osteonecrosis, or pathologic marrow infiltration. Other: A small amount of subacromial-subdeltoid bursal fluid is seen. IMPRESSION: Focal full-thickness tear of the superior subscapularis tendon measuring 1.5 cm in length. Mild to moderate subscapularis, supraspinatus, and infraspinatus tendinosis Mild glenohumeral joint osteoarthritis with superior labral degeneration Moderate to advanced AC joint arthrosis Electronically Signed   By: Prudencio Pair M.D.   On: 07/27/2020 08:51   I, Kelli [name], personally (independently) visualized and performed the interpretation of the images attached in this note.     Assessment and Plan: 75 y.o. female with persistent right shoulder pain.  Pain unfortunately is recurrent and not responding very well to steroid injections.  She has been doing home exercise program for the shoulder pain but has not had trial of physical therapy yet.  MRI eventually was obtained which showed a subscapularis rotator cuff tear that although is full-thickness is not complete.  Additionally she has rotator cuff tendinitis and AC DJD and bursitis.  She had a steroid injection recently which currently is working pretty well.  Because she her pain is currently controlled I  think she is a good candidate for trial of physical therapy prior to proceeding with surgical options.  Will reassess in about a month.  If pain is starting to  return will refer to orthopedic surgery for surgical evaluation.  She has a good relationship with Dr. [name] who treated her for left humerus fracture previously.  That would be first choice referral.   PDMP not reviewed this encounter. Orders Placed This Encounter  Procedures  . Ambulatory referral to Physical Therapy    Referral Priority:   Routine    Referral Type:   Physical Medicine    Referral Reason:   Specialty Services Required    Requested Specialty:   Physical Therapy   No orders of the defined types were placed in this encounter.    Discussed warning signs or symptoms. Please see discharge instructions. Patient expresses understanding.   The above documentation has been reviewed and is accurate and complete Kelli [name], M.D.  Total encounter time 20 minutes including face-to-face time with the patient and, reviewing past medical record, and charting on the date of service.   Discussion MRI results and treatment plan and options.

## 2020-07-28 NOTE — Progress Notes (Signed)
MRI right shoulder shows a focal full-thickness tear of the superior subscapularis tendon at the front of the shoulder measuring 1.5 cm.  However part of the tendon is still intact. The rest of the rotator cuff tendons are intact but do have some tendinitis.  There is some arthritis present as well in the shoulder.  Recommend return to clinic to go over the results in full detail.  This may benefit from surgery.

## 2020-07-29 ENCOUNTER — Other Ambulatory Visit: Payer: Self-pay

## 2020-07-29 ENCOUNTER — Encounter: Payer: Self-pay | Admitting: Family Medicine

## 2020-07-29 ENCOUNTER — Ambulatory Visit: Payer: Medicare HMO | Admitting: Family Medicine

## 2020-07-29 VITALS — BP 138/70 | HR 69 | Ht 68.0 in | Wt 183.0 lb

## 2020-07-29 DIAGNOSIS — G8929 Other chronic pain: Secondary | ICD-10-CM

## 2020-07-29 DIAGNOSIS — M25511 Pain in right shoulder: Secondary | ICD-10-CM | POA: Diagnosis not present

## 2020-07-29 DIAGNOSIS — M75111 Incomplete rotator cuff tear or rupture of right shoulder, not specified as traumatic: Secondary | ICD-10-CM

## 2020-07-29 NOTE — Patient Instructions (Signed)
Thank you for coming in today.  Plan for PT.   If this is not working let and know and I will get you back to Dr [name] there at orthopedics.   Keep me updated.

## 2020-08-02 ENCOUNTER — Ambulatory Visit (INDEPENDENT_AMBULATORY_CARE_PROVIDER_SITE_OTHER): Payer: Medicare HMO | Admitting: Rehabilitative and Restorative Service Providers"

## 2020-08-02 ENCOUNTER — Other Ambulatory Visit: Payer: Self-pay

## 2020-08-02 ENCOUNTER — Encounter: Payer: Self-pay | Admitting: Rehabilitative and Restorative Service Providers"

## 2020-08-02 DIAGNOSIS — M6281 Muscle weakness (generalized): Secondary | ICD-10-CM | POA: Diagnosis not present

## 2020-08-02 DIAGNOSIS — R293 Abnormal posture: Secondary | ICD-10-CM

## 2020-08-02 DIAGNOSIS — G8929 Other chronic pain: Secondary | ICD-10-CM | POA: Diagnosis not present

## 2020-08-02 DIAGNOSIS — M25511 Pain in right shoulder: Secondary | ICD-10-CM

## 2020-08-02 DIAGNOSIS — R29898 Other symptoms and signs involving the musculoskeletal system: Secondary | ICD-10-CM

## 2020-08-02 NOTE — Therapy (Signed)
[place], [place], [address] Phone: [phone]   Fax:  [phone]  Physical Therapy Evaluation  Patient Details  Name: Kelli [name] MRN: [id number] Date of Birth: Jan 08, 1946 Referring Provider (PT): Dr [name]   Encounter Date: 08/02/2020   PT End of Session - 08/02/20 1435    Visit Number 1    Number of Visits 12    Date for PT Re-Evaluation 09/13/20    PT Start Time 2831    PT Stop Time 1430    PT Time Calculation (min) 42 min    Activity Tolerance Patient tolerated treatment well           Past Medical History:  Diagnosis Date  . Anxiety   . Arthritis   . Borderline diabetic   . Colon cancer ([place])    colon ca dx 02/2009, lung cancer  . Depression   . Depression with anxiety 09/19/2011  . H/O colon cancer, stage II 09/19/2011   T3N0  [place] Sept 2010  . High cholesterol   . Hyperlipidemia 09/19/2011  . Lung nodules 09/19/2011   granulomas  . Urinary urgency     Past Surgical History:  Procedure Laterality Date  . APPENDECTOMY    . AUGMENTATION MAMMAPLASTY    . BREAST SURGERY  1978   augmentation  . COLON SURGERY     2010  . CRYO INTERCOSTAL NERVE BLOCK N/A 10/05/2014   Procedure: CRYO INTERCOSTAL NERVE BLOCK;  Surgeon: [name], MD;  Location: [place];  Service: Thoracic;  Laterality: N/A;  . LYMPH NODE DISSECTION Right 10/05/2014   Procedure: LYMPH NODE DISSECTION;  Surgeon: [name], MD;  Location: [facility];  Service: Thoracic;  Laterality: Right;  . ORIF HUMERUS FRACTURE Left 10/01/2018   Procedure: OPEN REDUCTION INTERNAL FIXATION (ORIF) LEFT PROXIMAL HUMERUS FRACTURE;  Surgeon: [name], MD;  Location: [facility];  Service: Orthopedics;  Laterality: Left;  . PARTIAL COLECTOMY Right 2010  . SEGMENTECOMY Right 10/05/2014   Procedure: right lower lobe SEGMENTECTOMY;  Surgeon: [name], MD;  Location: [facility];  Service: Thoracic;  Laterality:  Right;  [name] VIDEO ASSISTED THORACOSCOPY (VATS)/WEDGE RESECTION Right 10/05/2014   Procedure: Right VIDEO ASSISTED THORACOSCOPY (VATS) with right lower lobe lung nodule WEDGE RESECTION;  Surgeon: [name], MD;  Location: [facility];  Service: Thoracic;  Laterality: Right;    There were no vitals filed for this visit.    Subjective Assessment - 08/02/20 1354    Subjective Patient reports that she has been having injections in the Rt shoulder for several years but now shots do not last as long as they did. She has pain in the Rt shoulder but not since injection last week.    Pertinent History AODM; MI; lung and colon cancer ~ 2010/2015 now cancer free; fx Lt humerus 4/20; arthritis; bursitis Lt shoulder    Diagnostic tests Focal full-thickness tear of the superior subscapularis tendon  measuring 1.5 cm in length.     Mild to moderate subscapularis, supraspinatus, and infraspinatus  tendinosis     Mild glenohumeral joint osteoarthritis with superior labral  degeneration     Moderate to advanced AC joint arthrosis    Patient Stated Goals get Rt shoulder stronger and avoid surgery    Currently in Pain? No/denies    Pain Score 0-No pain    Pain Location Shoulder    Pain Orientation Right    Pain Descriptors / Indicators Aching  Pain Type Chronic pain    Pain Onset More than a month ago    Pain Frequency Intermittent              [facility] PT Assessment - 08/02/20 0001      Assessment   Medical Diagnosis Rt shoulder dysfunction    Referring Provider (PT) Dr [name]    Onset Date/Surgical Date 06/19/20   painful Rt shoulder for several years   Hand Dominance Right    Next MD Visit to schedule after 4-6 weeks of therapy    Prior Therapy here for Lt shoulder fracture 4/20      Precautions   Precautions None      Restrictions   Weight Bearing Restrictions No      Balance Screen   Has the patient fallen in the past 6 months No    Has the patient had a decrease in activity level  because of a fear of falling?  No    Is the patient reluctant to leave their home because of a fear of falling?  No      Home Ecologist residence    Living Arrangements Alone      Prior Function   Level of Independence Independent    Vocation Retired    Engineer, materials    Leisure household chores; helping sister with dementia 4 days a week      Observation/Other Assessments   Focus on Therapeutic Outcomes (FOTO)  Functional limitations score 45      Sensation   Additional Comments WFL's      Posture/Postural Control   Posture Comments head forward; shoulders rounded and elevated; scapulae abducted and rotated along the thoracic wall      AROM   Right Shoulder Extension 73 Degrees    Right Shoulder Flexion 147 Degrees    Right Shoulder ABduction 154 Degrees    Right Shoulder Internal Rotation --   thumb to T10   Right Shoulder External Rotation 73 Degrees   some end range tightness - shd 90 deg abd; elbow 90 deg flexion     Strength   Right Shoulder Flexion 4+/5    Right Shoulder Extension 5/5    Right Shoulder ABduction 4+/5    Right Shoulder Internal Rotation 4/5    Right Shoulder External Rotation 4+/5      Palpation   Spinal mobility hypomobility thoracic spine with CPA mobs and lateral mobs     Palpation comment muscular tightness through the anterior shoulder and lateral scapular area Rt shoulder girdle                      Objective measurements completed on examination: See above findings.       [facility] Adult PT Treatment/Exercise - 08/02/20 0001      Shoulder Exercises: Standing   External Rotation Strengthening;Right;10 reps;Theraband   isometric step outs to Rt holding Rt UE at side elbow flexed   Theraband Level (Shoulder External Rotation) Level 2 (Red);Level 3 (Green)    Extension Strengthening;Both;10 reps;Theraband    Theraband Level (Shoulder Extension) Level 3 (Green)    Row  Strengthening;Both;10 reps;Theraband    Theraband Level (Shoulder Row) Level 3 (Green)    Retraction Strengthening;Both;10 reps;Theraband    Theraband Level (Shoulder Retraction) Level 2 (Red)                  PT Education - 08/02/20 1427    Education  Details HEP POC    Person(s) Educated Patient    Methods Explanation;Demonstration;Tactile cues;Verbal cues;Handout    Comprehension Verbalized understanding;Returned demonstration;Verbal cues required;Tactile cues required               PT Long Term Goals - 08/02/20 1629      PT LONG TERM GOAL #1   Title Improve posture and alignment with patient to demonstrate improved upright posture with posterior shoulder girdle engaged    Time 6    Period Weeks    Status New    Target Date 09/13/20      PT LONG TERM GOAL #2   Title Increase strength Rt shoulder to 4+/5 to 5/5    Baseline -    Time 6    Period Weeks    Status New    Target Date 09/13/20      PT LONG TERM GOAL #3   Title Patient reports using Rt UE for functional activities including washing face; turning on/off light switch; etc    Baseline -    Time 6    Period Weeks    Status New    Target Date 09/13/20      PT LONG TERM GOAL #4   Title Independent in HEP    Time 6    Period Weeks    Status New    Target Date 09/13/20      PT LONG TERM GOAL #5   Title Improve functional limitation score to 63    Time 6    Period Weeks    Status New    Target Date 09/13/20                  Plan - 08/02/20 1436    Clinical Impression Statement Patient presents with long standing Rt shoulder dysfunction with pain and limited functional activity. She has received injections for Rt shoulder pain over the course of several years but has had shorter time of improvement post injection in the past year. MRI shows torn Rt subscapularius tear with supraspinitus and infraspinitus tendonosis. She has minimal to no pain following injectional last week. Kelli [name] has  poor posture and alignment; limited shoulder ROM/mobility; decreased strength Rt UE; decreased functional use of Rt UE. She will benefit from trial of PT to address strength and function of Rt shoulder with the goal to avoid shoulder surgery.    Stability/Clinical Decision Making Stable/Uncomplicated    Clinical Decision Making Low    Rehab Potential Good    PT Frequency 2x / week    PT Duration 6 weeks    PT Treatment/Interventions ADLs/Self Care Home Management;Aquatic Therapy;Cryotherapy;Electrical Stimulation;Iontophoresis 4mg /ml Dexamethasone;Moist Heat;Ultrasound;Functional mobility training;Therapeutic activities;Therapeutic exercise;Balance training;Neuromuscular re-education;Patient/family education;Manual techniques;Passive range of motion;Dry needling;Taping    PT Next Visit Plan review HEP; progress with Rt shoulder girdle strengthening    PT Home Exercise Plan [email or web address]    Consulted and Agree with Plan of Care Patient           Patient will benefit from skilled therapeutic intervention in order to improve the following deficits and impairments:  Decreased range of motion,Impaired UE functional use,Decreased activity tolerance,Pain,Impaired flexibility,Improper body mechanics,Decreased mobility,Decreased strength,Postural dysfunction  Visit Diagnosis: Chronic right shoulder pain  Other symptoms and signs involving the musculoskeletal system  Abnormal posture  Muscle weakness (generalized)     Problem List Patient Active Problem List   Diagnosis Date Noted  . Closed fracture of left proximal humerus 09/26/2018  . Squamous cell carcinoma 03/05/2017  .  Echocardiogram shows left ventricular diastolic dysfunction 16/94/5038  . Heart palpitations 07/25/2016  . Need for vaccination for pneumococcus 07/13/2016  . Osteoarthritis of hand, right 08/06/2015  . Diabetes mellitus type 2, uncomplicated ([place]) 88/28/0034  . Lung cancer, lower lobe ([place]) 10/09/2014  . Hematochezia  04/01/2013  . H/O colon cancer, stage II 09/19/2011  . Depression with anxiety 09/19/2011  . Hyperlipidemia 09/19/2011    Kelli [name] Nilda Simmer PT, MPH  08/02/2020, 4:33 PM  [facility] [place], [place], [address] Phone: [phone]   Fax:  [phone]  Name: Kelli [name] MRN: [id number] Date of Birth: 1945/12/20

## 2020-08-02 NOTE — Patient Instructions (Signed)
Access Code: [email or web address]: [email or web address]: 02/28/2022Prepared by: Shawnae Leiva HoltExercises  Standing Shoulder External Rotation with Resistance - 2 x daily - 7 x weekly - 1-3 sets - 10 reps - 2-3 sec hold  Shoulder Extension with Resistance - 2 x daily - 7 x weekly - 1-3 sets - 10 reps - 2-3 sec hold  Standing Bilateral Low Shoulder Row with Anchored Resistance - 2 x daily - 7 x weekly - 1-3 sets - 10 reps - 2-3 sec hold  Shoulder External Rotation Reactive Isometrics - 2 x daily - 7 x weekly - 1 sets - 10 reps - 3 sec hold

## 2020-08-04 ENCOUNTER — Ambulatory Visit (INDEPENDENT_AMBULATORY_CARE_PROVIDER_SITE_OTHER): Payer: Medicare HMO | Admitting: Rehabilitative and Restorative Service Providers"

## 2020-08-04 ENCOUNTER — Encounter: Payer: Self-pay | Admitting: Rehabilitative and Restorative Service Providers"

## 2020-08-04 ENCOUNTER — Other Ambulatory Visit: Payer: Self-pay

## 2020-08-04 DIAGNOSIS — R29898 Other symptoms and signs involving the musculoskeletal system: Secondary | ICD-10-CM | POA: Diagnosis not present

## 2020-08-04 DIAGNOSIS — M25511 Pain in right shoulder: Secondary | ICD-10-CM

## 2020-08-04 DIAGNOSIS — G8929 Other chronic pain: Secondary | ICD-10-CM

## 2020-08-04 DIAGNOSIS — R293 Abnormal posture: Secondary | ICD-10-CM

## 2020-08-04 DIAGNOSIS — M6281 Muscle weakness (generalized): Secondary | ICD-10-CM | POA: Diagnosis not present

## 2020-08-04 NOTE — Therapy (Signed)
[place] [place], [place], [address] Phone: [phone]   Fax:  [phone]  Physical Therapy Treatment  Patient Details  Name: Kelli [name] MRN: [id number] Date of Birth: February 24, 1946 Referring Provider (PT): Dr [name]   Encounter Date: 08/04/2020   PT End of Session - 08/04/20 1353    Visit Number 2    Number of Visits 12    Date for PT Re-Evaluation 09/13/20    PT Start Time 1350    PT Stop Time 1430    PT Time Calculation (min) 40 min    Activity Tolerance Patient tolerated treatment well           Past Medical History:  Diagnosis Date  . Anxiety   . Arthritis   . Borderline diabetic   . Colon cancer ([place])    colon ca dx 02/2009, lung cancer  . Depression   . Depression with anxiety 09/19/2011  . H/O colon cancer, stage II 09/19/2011   T3N0  Cecum Sept 2010  . High cholesterol   . Hyperlipidemia 09/19/2011  . Lung nodules 09/19/2011   granulomas  . Urinary urgency     Past Surgical History:  Procedure Laterality Date  . APPENDECTOMY    . AUGMENTATION MAMMAPLASTY    . BREAST SURGERY  1978   augmentation  . COLON SURGERY     2010  . CRYO INTERCOSTAL NERVE BLOCK N/A 10/05/2014   Procedure: CRYO INTERCOSTAL NERVE BLOCK;  Surgeon: [name], MD;  Location: [facility];  Service: Thoracic;  Laterality: N/A;  . LYMPH NODE DISSECTION Right 10/05/2014   Procedure: LYMPH NODE DISSECTION;  Surgeon: [name], MD;  Location: [facility];  Service: Thoracic;  Laterality: Right;  . ORIF HUMERUS FRACTURE Left 10/01/2018   Procedure: OPEN REDUCTION INTERNAL FIXATION (ORIF) LEFT PROXIMAL HUMERUS FRACTURE;  Surgeon: [name], MD;  Location: [facility];  Service: Orthopedics;  Laterality: Left;  . PARTIAL COLECTOMY Right 2010  . SEGMENTECOMY Right 10/05/2014   Procedure: right lower lobe SEGMENTECTOMY;  Surgeon: [name], MD;  Location: [facility];  Service: Thoracic;  Laterality:  Right;  [name] VIDEO ASSISTED THORACOSCOPY (VATS)/WEDGE RESECTION Right 10/05/2014   Procedure: Right VIDEO ASSISTED THORACOSCOPY (VATS) with right lower lobe lung nodule WEDGE RESECTION;  Surgeon: [name], MD;  Location: [facility];  Service: Thoracic;  Laterality: Right;    There were no vitals filed for this visit.   Subjective Assessment - 08/04/20 1354    Subjective Patient reports that she did her exercises yesterday.    Currently in Pain? No/denies    Pain Score 0-No pain                             [facility] Adult PT Treatment/Exercise - 08/04/20 0001      Shoulder Exercises: Supine   Other Supine Exercises shd flexion to 90 deg circles CW/CCW x 30 reps w/5# wt; alphabet w/ 5# wt      Shoulder Exercises: Seated   Other Seated Exercises bracioradialis curl x 20 reps 5# each UE      Shoulder Exercises: Prone   Other Prone Exercises rowing x 20; shoulder horizontal abduction x 20 Rt    Other Prone Exercises protraction/retraction scapulae POE x 20 reps      Shoulder Exercises: Standing   External Rotation Strengthening;Right;20 reps;Theraband    Theraband Level (Shoulder External Rotation) Level 2 (Red);Level  3 (Green)    Extension Strengthening;Both;20 reps;Theraband    Theraband Level (Shoulder Extension) Level 3 (Green)    Row Strengthening;Both;20 reps;Theraband    Theraband Level (Shoulder Row) Level 3 (Green)    Row Weight (lbs) bent row x 20 reps 5#    Row Limitations bow and arrow green TB x 10 reps x 2 sets each UE    Retraction Strengthening;Both;20 reps;Theraband    Theraband Level (Shoulder Retraction) Level 2 (Red)    Shoulder Elevation Limitations TB flexbar in elevation 1 min x 2 reps each UE    Other Standing Exercises reverse wall push up x 20 reps 5 sec hold pushing wiht elbows in 90 deg flexion and straight 20 reps each      Shoulder Exercises: ROM/Strengthening   UBE (Upper Arm Bike) L3 x 3 min                  PT Education  - 08/04/20 1431    Education Details HEP    Person(s) Educated Patient    Methods Explanation;Demonstration;Tactile cues;Verbal cues;Handout    Comprehension Verbalized understanding;Returned demonstration;Verbal cues required;Tactile cues required               PT Long Term Goals - 08/02/20 1629      PT LONG TERM GOAL #1   Title Improve posture and alignment with patient to demonstrate improved upright posture with posterior shoulder girdle engaged    Time 6    Period Weeks    Status New    Target Date 09/13/20      PT LONG TERM GOAL #2   Title Increase strength Rt shoulder to 4+/5 to 5/5    Baseline -    Time 6    Period Weeks    Status New    Target Date 09/13/20      PT LONG TERM GOAL #3   Title Patient reports using Rt UE for functional activities including washing face; turning on/off light switch; etc    Baseline -    Time 6    Period Weeks    Status New    Target Date 09/13/20      PT LONG TERM GOAL #4   Title Independent in HEP    Time 6    Period Weeks    Status New    Target Date 09/13/20      PT LONG TERM GOAL #5   Title Improve functional limitation score to 63    Time 6    Period Weeks    Status New    Target Date 09/13/20                 Plan - 08/04/20 1407    Clinical Impression Statement No pain. Working on UE strengthening with TB and dumbbells as well as wt bearing activities. Progressing exercises today without difficulty.    Rehab Potential Good    PT Frequency 2x / week    PT Duration 6 weeks    PT Treatment/Interventions ADLs/Self Care Home Management;Aquatic Therapy;Cryotherapy;Electrical Stimulation;Iontophoresis 4mg /ml Dexamethasone;Moist Heat;Ultrasound;Functional mobility training;Therapeutic activities;Therapeutic exercise;Balance training;Neuromuscular re-education;Patient/family education;Manual techniques;Passive range of motion;Dry needling;Taping    PT Next Visit Plan review HEP; progress with Rt shoulder girdle  strengthening    PT Home Exercise Plan [facility]           Patient will benefit from skilled therapeutic intervention in order to improve the following deficits and impairments:     Visit Diagnosis: Chronic right shoulder pain  Other symptoms and signs involving the musculoskeletal system  Abnormal posture  Muscle weakness (generalized)     Problem List Patient Active Problem List   Diagnosis Date Noted  . Closed fracture of left proximal humerus 09/26/2018  . Squamous cell carcinoma 03/05/2017  . Echocardiogram shows left ventricular diastolic dysfunction 65/78/4696  . Heart palpitations 07/25/2016  . Need for vaccination for pneumococcus 07/13/2016  . Osteoarthritis of hand, right 08/06/2015  . Diabetes mellitus type 2, uncomplicated ([place] [facility]) 29/52/8413  . Lung cancer, lower lobe ([place]) 10/09/2014  . Hematochezia 04/01/2013  . H/O colon cancer, stage II 09/19/2011  . Depression with anxiety 09/19/2011  . Hyperlipidemia 09/19/2011    Zyria Fiscus Nilda Simmer PT, MPH  08/04/2020, 2:32 PM  [facility] [place], [place], [address] Phone: [phone]   Fax:  [phone]  Name: CASSANDRE OLEKSY MRN: [id number] Date of Birth: 10-May-1946

## 2020-08-04 NOTE — Patient Instructions (Signed)
[email or web address] Code: [email or web address]: [email or web address]: 02/28/2022Prepared by: Vance Hochmuth HoltExercises  Standing Shoulder External Rotation with Resistance - 2 x daily - 7 x weekly - 1-3 sets - 10 reps - 2-3 sec hold  Shoulder Extension with Resistance - 2 x daily - 7 x weekly - 1-3 sets - 10 reps - 2-3 sec hold  Standing Bilateral Low Shoulder Row with Anchored Resistance - 2 x daily - 7 x weekly - 1-3 sets - 10 reps - 2-3 sec hold  Shoulder External Rotation Reactive Isometrics - 2 x daily - 7 x weekly - 1 sets - 10 reps - 3 sec hold  Supine Shoulder Alphabet - 2 x daily - 7 x weekly - 1 sets - 1-2 reps  Bent Over Single Arm Shoulder Row with Dumbbell - 2 x daily - 7 x weekly - 2-3 sets - 10 reps - 3 sec hold

## 2020-08-05 ENCOUNTER — Other Ambulatory Visit: Payer: Self-pay | Admitting: Osteopathic Medicine

## 2020-08-09 ENCOUNTER — Encounter: Payer: Medicare HMO | Admitting: Rehabilitative and Restorative Service Providers"

## 2020-08-12 ENCOUNTER — Other Ambulatory Visit: Payer: Self-pay

## 2020-08-12 ENCOUNTER — Encounter: Payer: Self-pay | Admitting: Rehabilitative and Restorative Service Providers"

## 2020-08-12 ENCOUNTER — Ambulatory Visit: Payer: Medicare HMO | Admitting: Rehabilitative and Restorative Service Providers"

## 2020-08-12 DIAGNOSIS — M25511 Pain in right shoulder: Secondary | ICD-10-CM

## 2020-08-12 DIAGNOSIS — R29898 Other symptoms and signs involving the musculoskeletal system: Secondary | ICD-10-CM | POA: Diagnosis not present

## 2020-08-12 DIAGNOSIS — M6281 Muscle weakness (generalized): Secondary | ICD-10-CM | POA: Diagnosis not present

## 2020-08-12 DIAGNOSIS — G8929 Other chronic pain: Secondary | ICD-10-CM

## 2020-08-12 DIAGNOSIS — R293 Abnormal posture: Secondary | ICD-10-CM | POA: Diagnosis not present

## 2020-08-12 NOTE — Patient Instructions (Addendum)
  Access Code: [email or web address]: [email or web address]: 03/10/2022Prepared by: Cassidi Modesitt HoltExercises  Standing Shoulder External Rotation with Resistance - 2 x daily - 7 x weekly - 1-3 sets - 10 reps - 2-3 sec hold  Shoulder Extension with Resistance - 2 x daily - 7 x weekly - 1-3 sets - 10 reps - 2-3 sec hold  Standing Bilateral Low Shoulder Row with Anchored Resistance - 2 x daily - 7 x weekly - 1-3 sets - 10 reps - 2-3 sec hold  Supine Shoulder Alphabet - 2 x daily - 7 x weekly - 1 sets - 1-2 reps  Bent Over Single Arm Shoulder Row with Dumbbell - 2 x daily - 7 x weekly - 2-3 sets - 10 reps - 5 sec hold  Anti-Rotation Lateral Stepping with Press - 2 x daily - 7 x weekly - 1-2 sets - 10 reps - 2-3 sec hold  Shoulder External Rotation Reactive Isometrics - 2 x daily - 7 x weekly - 1 sets - 10 reps - 3 sec hold  Shoulder Internal Rotation Reactive Isometrics - 2 x daily - 7 x weekly - 1 sets - 3 reps - 30 sec hold  Isometric Shoulder External Rotation in Abduction with Ball at Wall - 2 x daily - 7 x weekly - 1 sets - 5-10 reps - 30-60 sec hold

## 2020-08-12 NOTE — Therapy (Signed)
[facility] [place], [place], [address] Phone: [phone]   Fax:  [phone]  Physical Therapy Treatment  Patient Details  Name: Kelli [name] MRN: [id number] Date of Birth: 03-01-1946 Referring Provider (PT): Dr [name]   Encounter Date: 08/12/2020   PT End of Session - 08/12/20 1108    Visit Number 3    Number of Visits 12    Date for PT Re-Evaluation 09/13/20    PT Start Time 1100    PT Stop Time 1145    PT Time Calculation (min) 45 min    Activity Tolerance Patient tolerated treatment well           Past Medical History:  Diagnosis Date  . Anxiety   . Arthritis   . Borderline diabetic   . Colon cancer ([place])    colon ca dx 02/2009, lung cancer  . Depression   . Depression with anxiety 09/19/2011  . H/O colon cancer, stage II 09/19/2011   T3N0  Cecum Sept 2010  . High cholesterol   . Hyperlipidemia 09/19/2011  . Lung nodules 09/19/2011   granulomas  . Urinary urgency     Past Surgical History:  Procedure Laterality Date  . APPENDECTOMY    . AUGMENTATION MAMMAPLASTY    . BREAST SURGERY  1978   augmentation  . COLON SURGERY     2010  . CRYO INTERCOSTAL NERVE BLOCK N/A 10/05/2014   Procedure: CRYO INTERCOSTAL NERVE BLOCK;  Surgeon: [name], MD;  Location: [facility];  Service: Thoracic;  Laterality: N/A;  . LYMPH NODE DISSECTION Right 10/05/2014   Procedure: LYMPH NODE DISSECTION;  Surgeon: [name], MD;  Location: [facility];  Service: Thoracic;  Laterality: Right;  . ORIF HUMERUS FRACTURE Left 10/01/2018   Procedure: OPEN REDUCTION INTERNAL FIXATION (ORIF) LEFT PROXIMAL HUMERUS FRACTURE;  Surgeon: [name], MD;  Location: [facility];  Service: Orthopedics;  Laterality: Left;  . PARTIAL COLECTOMY Right 2010  . SEGMENTECOMY Right 10/05/2014   Procedure: right lower lobe SEGMENTECTOMY;  Surgeon: [name], MD;  Location: [facility];  Service: Thoracic;  Laterality:  Right;  [name] VIDEO ASSISTED THORACOSCOPY (VATS)/WEDGE RESECTION Right 10/05/2014   Procedure: Right VIDEO ASSISTED THORACOSCOPY (VATS) with right lower lobe lung nodule WEDGE RESECTION;  Surgeon: [name], MD;  Location: [facility];  Service: Thoracic;  Laterality: Right;    There were no vitals filed for this visit.   Subjective Assessment - 08/12/20 1110    Subjective Did exercises at home. No pain    Currently in Pain? No/denies    Pain Score 0-No pain    Pain Location Shoulder    Pain Orientation Right              [facility] PT Assessment - 08/12/20 0001      Assessment   Medical Diagnosis Rt shoulder dysfunction    Referring Provider (PT) Dr [name]    Onset Date/Surgical Date 06/19/20   painful Rt shoulder for several years   Hand Dominance Right    Next MD Visit to schedule after 4-6 weeks of therapy    Prior Therapy here for Lt shoulder fracture 4/20      Strength   Right Shoulder Flexion 4+/5    Right Shoulder Extension 5/5    Right Shoulder ABduction 4+/5    Right Shoulder Internal Rotation 4+/5    Right Shoulder External Rotation 4+/5  [facility] Adult PT Treatment/Exercise - 08/12/20 0001      Shoulder Exercises: Supine   Flexion Strengthening;Right;15 reps;Weights    Shoulder Flexion Weight (lbs) 2    Other Supine Exercises shd flexion to 90 deg circles CW/CCW x 30 reps w/2# wt; alphabet w/ 2# wt - increase wt next time      Shoulder Exercises: Seated   External Rotation Strengthening;Right;20 reps;Weights    External Rotation Weight (lbs) 3      Shoulder Exercises: Sidelying   External Rotation Strengthening;Right;20 reps;Weights    External Rotation Weight (lbs) 2    ABduction Strengthening;Right;20 reps;Weights    ABduction Weight (lbs) 2    ABduction Limitations through partial range ~ 70 deg      Shoulder Exercises: Standing   External Rotation Strengthening;Right;20 reps;Theraband   isometric step out    Theraband Level (Shoulder External Rotation) Level 4 (Blue)    Internal Rotation Strengthening;Right;20 reps;Theraband    Theraband Level (Shoulder Internal Rotation) Level 4 (Blue)    Flexion Right;12 reps;Theraband    Theraband Level (Shoulder Flexion) Level 4 (Blue)    Flexion Limitations to ~ 80 deg    Extension Strengthening;Both;20 reps;Theraband    Theraband Level (Shoulder Extension) Level 4 (Blue)    Row Strengthening;Both;20 reps;Theraband    Theraband Level (Shoulder Row) Level 4 (Blue)    Row Weight (lbs) bent row x 20 reps 5#    Row Limitations bow and arrow green TB x 10 reps x 2 sets each UE    Shoulder Elevation Limitations TB flexbar in elevation 1 min x 2 reps each UE    Other Standing Exercises reverse wall push up x 20 reps 5 sec hold pushing wiht elbows in 90 deg flexion and straight 20 reps each    Other Standing Exercises ER ball on dorsum of hand; shoulder ~ 50 deg abduction from body small circles to fatigue      Shoulder Exercises: Therapy Ball   Flexion Right;Left;10 reps   rolling large orange ball up wall   Flexion Limitations Lt shoulder assisting to ROM limitations from prior injury/sx    Other Therapy Ball Exercises bouncing ball on wall chest height x 30-40 reps    Other Therapy Ball Exercises wt bearing on orange ball bilat alternating shd horizontal abduction stabilizing with wt bearing limb x 20 each UE      Shoulder Exercises: ROM/Strengthening   UBE (Upper Arm Bike) L3 x 4 min                  PT Education - 08/12/20 1128    Education Details HEP    Person(s) Educated Patient    Methods Explanation;Demonstration;Tactile cues;Verbal cues;Handout    Comprehension Verbalized understanding;Returned demonstration;Verbal cues required;Tactile cues required               PT Long Term Goals - 08/02/20 1629      PT LONG TERM GOAL #1   Title Improve posture and alignment with patient to demonstrate improved upright posture with posterior  shoulder girdle engaged    Time 6    Period Weeks    Status New    Target Date 09/13/20      PT LONG TERM GOAL #2   Title Increase strength Rt shoulder to 4+/5 to 5/5    Baseline -    Time 6    Period Weeks    Status New    Target Date 09/13/20      PT LONG TERM  GOAL #3   Title Patient reports using Rt UE for functional activities including washing face; turning on/off light switch; etc    Baseline -    Time 6    Period Weeks    Status New    Target Date 09/13/20      PT LONG TERM GOAL #4   Title Independent in HEP    Time 6    Period Weeks    Status New    Target Date 09/13/20      PT LONG TERM GOAL #5   Title Improve functional limitation score to 63    Time 6    Period Weeks    Status New    Target Date 09/13/20                 Plan - 08/12/20 1113    Clinical Impression Statement Continue work on strengthening at home and in the clinic. Patient works to fatigue with reminders to slow down at times. Working on open and closed chain strengthening activities. Greatest fatigue with resistive ER exercises. Patient provided with a several exercises and varies program for home.    Rehab Potential Good    PT Frequency 2x / week    PT Duration 6 weeks    PT Treatment/Interventions ADLs/Self Care Home Management;Aquatic Therapy;Cryotherapy;Electrical Stimulation;Iontophoresis 4mg /ml Dexamethasone;Moist Heat;Ultrasound;Functional mobility training;Therapeutic activities;Therapeutic exercise;Balance training;Neuromuscular re-education;Patient/family education;Manual techniques;Passive range of motion;Dry needling;Taping    PT Next Visit Plan review HEP; progress with Rt shoulder girdle strengthening    PT Home Exercise Plan [email or web address]    Consulted and Agree with Plan of Care Patient           Patient will benefit from skilled therapeutic intervention in order to improve the following deficits and impairments:     Visit Diagnosis: Chronic right shoulder  pain  Other symptoms and signs involving the musculoskeletal system  Abnormal posture  Muscle weakness (generalized)     Problem List Patient Active Problem List   Diagnosis Date Noted  . Closed fracture of left proximal humerus 09/26/2018  . Squamous cell carcinoma 03/05/2017  . Echocardiogram shows left ventricular diastolic dysfunction 54/02/8118  . Heart palpitations 07/25/2016  . Need for vaccination for pneumococcus 07/13/2016  . Osteoarthritis of hand, right 08/06/2015  . Diabetes mellitus type 2, uncomplicated ([place]) 14/78/2956  . Lung cancer, lower lobe ([place]) 10/09/2014  . Hematochezia 04/01/2013  . H/O colon cancer, stage II 09/19/2011  . Depression with anxiety 09/19/2011  . Hyperlipidemia 09/19/2011    Jahari Billy Nilda Simmer PT, MPH  08/12/2020, 12:00 PM  [facility] [place], [place], [address] Phone: [phone]   Fax:  [phone]  Name: Kelli [name] MRN: [id number] Date of Birth: 1946/05/22

## 2020-08-17 ENCOUNTER — Other Ambulatory Visit: Payer: Self-pay

## 2020-08-17 ENCOUNTER — Ambulatory Visit (INDEPENDENT_AMBULATORY_CARE_PROVIDER_SITE_OTHER): Payer: Medicare HMO | Admitting: Rehabilitative and Restorative Service Providers"

## 2020-08-17 DIAGNOSIS — R293 Abnormal posture: Secondary | ICD-10-CM

## 2020-08-17 DIAGNOSIS — M25511 Pain in right shoulder: Secondary | ICD-10-CM

## 2020-08-17 DIAGNOSIS — M6281 Muscle weakness (generalized): Secondary | ICD-10-CM | POA: Diagnosis not present

## 2020-08-17 DIAGNOSIS — R29898 Other symptoms and signs involving the musculoskeletal system: Secondary | ICD-10-CM

## 2020-08-17 DIAGNOSIS — G8929 Other chronic pain: Secondary | ICD-10-CM | POA: Diagnosis not present

## 2020-08-17 NOTE — Therapy (Signed)
[place], [place], [address] Phone: [phone]   Fax:  [phone]  Physical Therapy Treatment  Patient Details  Name: Kelli [name] MRN: [id number] Date of Birth: [birth year] Referring Provider (PT): Dr [name]   Encounter Date: 08/17/2020   PT End of Session - 08/17/20 1145    Visit Number 4    Number of Visits 12    Date for PT Re-Evaluation 09/13/20    PT Start Time 1107    PT Stop Time 1145    PT Time Calculation (min) 38 min    Activity Tolerance Patient tolerated treatment well    Behavior During Therapy [facility] for tasks assessed/performed           Past Medical History:  Diagnosis Date  . Anxiety   . Arthritis   . Borderline diabetic   . Colon cancer ([place])    colon ca dx 02/2009, lung cancer  . Depression   . Depression with anxiety 09/19/2011  . H/O colon cancer, stage II 09/19/2011   T3N0  Cecum Sept 2010  . High cholesterol   . Hyperlipidemia 09/19/2011  . Lung nodules 09/19/2011   granulomas  . Urinary urgency     Past Surgical History:  Procedure Laterality Date  . APPENDECTOMY    . AUGMENTATION MAMMAPLASTY    . BREAST SURGERY  1978   augmentation  . COLON SURGERY     2010  . CRYO INTERCOSTAL NERVE BLOCK N/A 10/05/2014   Procedure: CRYO INTERCOSTAL NERVE BLOCK;  Surgeon: [name], MD;  Location: [place];  Service: Thoracic;  Laterality: N/A;  . LYMPH NODE DISSECTION Right 10/05/2014   Procedure: LYMPH NODE DISSECTION;  Surgeon: [name], MD;  Location: [place];  Service: Thoracic;  Laterality: Right;  . ORIF HUMERUS FRACTURE Left 10/01/2018   Procedure: OPEN REDUCTION INTERNAL FIXATION (ORIF) LEFT PROXIMAL HUMERUS FRACTURE;  Surgeon: [name], MD;  Location: [place];  Service: Orthopedics;  Laterality: Left;  . PARTIAL COLECTOMY Right 2010  . SEGMENTECOMY Right 10/05/2014   Procedure: right lower lobe SEGMENTECTOMY;  Surgeon: [name], MD;  Location: [place];  Service: Thoracic;  Laterality: Right;  [name] VIDEO ASSISTED THORACOSCOPY (VATS)/WEDGE RESECTION Right 10/05/2014   Procedure: Right VIDEO ASSISTED THORACOSCOPY (VATS) with right lower lobe lung nodule WEDGE RESECTION;  Surgeon: [name], MD;  Location: [place];  Service: Thoracic;  Laterality: Right;    There were no vitals filed for this visit.   Subjective Assessment - 08/17/20 1110    Subjective The patient had some mild pain in the right shoulder over the weekend when sitting down.  She is not aware of any movement that would have increased pain.    Pertinent History AODM; MI; lung and colon cancer ~ 2010/2015 now cancer free; fx Lt humerus 4/20; arthritis; bursitis Lt shoulder    Patient Stated Goals get Rt shoulder stronger and avoid surgery    Currently in Pain? No/denies              [facility] PT Assessment - 08/17/20 1112      Assessment   Medical Diagnosis Rt shoulder dysfunction    Referring Provider (PT) Dr [name]    Onset Date/Surgical Date 06/19/20    Hand Dominance Right                         [facility] Adult PT Treatment/Exercise -  08/17/20 1112      Self-Care   Self-Care Other Self-Care Comments    Other Self-Care Comments  Patient and PT discussed HEP-- she is not performing on weekends due to being at her sister's house.  She is using UBE instead of doing ther ex (at her sister's).      Exercises   Exercises Shoulder      Shoulder Exercises: Supine   Protraction Strengthening;Right;12 reps    Protraction Weight (lbs) 2    Horizontal ABduction Strengthening;Right;12 reps    Horizontal ABduction Weight (lbs) 2    Flexion Strengthening;Right;15 reps      Shoulder Exercises: Prone   Retraction Strengthening;Both;10 reps    Extension Both;Strengthening;10 reps    Extension Weight (lbs) 2    Horizontal ABduction 1 Strengthening;Right;10 reps      Shoulder Exercises: Sidelying   External Rotation  Strengthening;Right    External Rotation Weight (lbs) 2      Shoulder Exercises: Standing   External Rotation Strengthening;Right;AROM    External Rotation Limitations isometric press into ball against wall    Retraction Strengthening;Right    Retraction Limitations W to Y position on wall with cues for posture    Other Standing Exercises push up plus x 10 reps bilateral UEs,    Other Standing Exercises neck retraction against ball on wall                       PT Long Term Goals - 08/02/20 1629      PT LONG TERM GOAL #1   Title Improve posture and alignment with patient to demonstrate improved upright posture with posterior shoulder girdle engaged    Time 6    Period Weeks    Status New    Target Date 09/13/20      PT LONG TERM GOAL #2   Title Increase strength Rt shoulder to 4+/5 to 5/5    Baseline -    Time 6    Period Weeks    Status New    Target Date 09/13/20      PT LONG TERM GOAL #3   Title Patient reports using Rt UE for functional activities including washing face; turning on/off light switch; etc    Baseline -    Time 6    Period Weeks    Status New    Target Date 09/13/20      PT LONG TERM GOAL #4   Title Independent in HEP    Time 6    Period Weeks    Status New    Target Date 09/13/20      PT LONG TERM GOAL #5   Title Improve functional limitation score to 63    Time 6    Period Weeks    Status New    Target Date 09/13/20                 Plan - 08/17/20 1255    Clinical Impression Statement The patient notes intermittent performance of HEP due to traveling to care for her sister on weekends.  She notes ther ex is getting easier.  PT is encouraging continued strengthening exercises to improve functional use of R UE.  REduced frequency to 1x/week for this week and next based on patient feedback.    PT Treatment/Interventions ADLs/Self Care Home Management;Aquatic Therapy;Cryotherapy;Electrical Stimulation;Iontophoresis 4mg /ml  Dexamethasone;Moist Heat;Ultrasound;Functional mobility training;Therapeutic activities;Therapeutic exercise;Balance training;Neuromuscular re-education;Patient/family education;Manual techniques;Passive range of motion;Dry needling;Taping    PT Next Visit  Plan review HEP; progress with Rt shoulder girdle strengthening    PT Home Exercise Plan [email or web address]    Consulted and Agree with Plan of Care Patient           Patient will benefit from skilled therapeutic intervention in order to improve the following deficits and impairments:     Visit Diagnosis: Chronic right shoulder pain  Other symptoms and signs involving the musculoskeletal system  Abnormal posture  Muscle weakness (generalized)     Problem List Patient Active Problem List   Diagnosis Date Noted  . Closed fracture of left proximal humerus 09/26/2018  . Squamous cell carcinoma 03/05/2017  . Echocardiogram shows left ventricular diastolic dysfunction 35/46/5681  . Heart palpitations 07/25/2016  . Need for vaccination for pneumococcus 07/13/2016  . Osteoarthritis of hand, right 08/06/2015  . Diabetes mellitus type 2, uncomplicated ([place]) 27/51/7001  . Lung cancer, lower lobe ([place]) 10/09/2014  . Hematochezia 04/01/2013  . H/O colon cancer, stage II 09/19/2011  . Depression with anxiety 09/19/2011  . Hyperlipidemia 09/19/2011    Ally Knodel, PT 08/17/2020, 12:56 PM  [facility] [place], [place], [address] Phone: [phone]   Fax:  [phone]  Name: Kelli [name] MRN: [id number] Date of Birth: Sep 16, 1945

## 2020-08-19 ENCOUNTER — Encounter: Payer: Medicare HMO | Admitting: Rehabilitative and Restorative Service Providers"

## 2020-08-24 ENCOUNTER — Encounter: Payer: Medicare HMO | Admitting: Rehabilitative and Restorative Service Providers"

## 2020-08-26 ENCOUNTER — Encounter: Payer: Self-pay | Admitting: Rehabilitative and Restorative Service Providers"

## 2020-08-26 ENCOUNTER — Other Ambulatory Visit: Payer: Self-pay

## 2020-08-26 ENCOUNTER — Ambulatory Visit: Payer: Medicare HMO | Admitting: Rehabilitative and Restorative Service Providers"

## 2020-08-26 DIAGNOSIS — R29898 Other symptoms and signs involving the musculoskeletal system: Secondary | ICD-10-CM | POA: Diagnosis not present

## 2020-08-26 DIAGNOSIS — R293 Abnormal posture: Secondary | ICD-10-CM

## 2020-08-26 DIAGNOSIS — M25511 Pain in right shoulder: Secondary | ICD-10-CM | POA: Diagnosis not present

## 2020-08-26 DIAGNOSIS — M6281 Muscle weakness (generalized): Secondary | ICD-10-CM | POA: Diagnosis not present

## 2020-08-26 DIAGNOSIS — G8929 Other chronic pain: Secondary | ICD-10-CM

## 2020-08-26 NOTE — Therapy (Signed)
[place], [place], [address] Phone: [phone]   Fax:  [phone]  Physical Therapy Treatment  Patient Details  Name: Kelli [name] MRN: [id number] Date of Birth: June 12, 1945 Referring Provider (PT): Dr [name]   Encounter Date: 08/26/2020   PT End of Session - 08/26/20 1534    Visit Number 5    Number of Visits 12    Date for PT Re-Evaluation 09/13/20    PT Start Time 5329    PT Stop Time 1605    PT Time Calculation (min) 32 min    Activity Tolerance Patient tolerated treatment well           Past Medical History:  Diagnosis Date  . Anxiety   . Arthritis   . Borderline diabetic   . Colon cancer ([place])    colon ca dx 02/2009, lung cancer  . Depression   . Depression with anxiety 09/19/2011  . H/O colon cancer, stage II 09/19/2011   T3N0  Cecum Sept 2010  . High cholesterol   . Hyperlipidemia 09/19/2011  . Lung nodules 09/19/2011   granulomas  . Urinary urgency     Past Surgical History:  Procedure Laterality Date  . APPENDECTOMY    . AUGMENTATION MAMMAPLASTY    . BREAST SURGERY  1978   augmentation  . COLON SURGERY     2010  . CRYO INTERCOSTAL NERVE BLOCK N/A 10/05/2014   Procedure: CRYO INTERCOSTAL NERVE BLOCK;  Surgeon: [name], MD;  Location: [place];  Service: Thoracic;  Laterality: N/A;  . LYMPH NODE DISSECTION Right 10/05/2014   Procedure: LYMPH NODE DISSECTION;  Surgeon: [name], MD;  Location: [place];  Service: Thoracic;  Laterality: Right;  . ORIF HUMERUS FRACTURE Left 10/01/2018   Procedure: OPEN REDUCTION INTERNAL FIXATION (ORIF) LEFT PROXIMAL HUMERUS FRACTURE;  Surgeon: [name], MD;  Location: [place];  Service: Orthopedics;  Laterality: Left;  . PARTIAL COLECTOMY Right 2010  . SEGMENTECOMY Right 10/05/2014   Procedure: right lower lobe SEGMENTECTOMY;  Surgeon: [name], MD;  Location: [place];  Service: Thoracic;  Laterality:  Right;  [name] VIDEO ASSISTED THORACOSCOPY (VATS)/WEDGE RESECTION Right 10/05/2014   Procedure: Right VIDEO ASSISTED THORACOSCOPY (VATS) with right lower lobe lung nodule WEDGE RESECTION;  Surgeon: [name], MD;  Location: [place];  Service: Thoracic;  Laterality: Right;    There were no vitals filed for this visit.   Subjective Assessment - 08/26/20 1535    Subjective Patient reports that her shoulder is still pain free. She has been working on her exercises. She feels confident in continuing the exercises on her own.    Currently in Pain? No/denies    Pain Score 0-No pain              [facility] PT Assessment - 08/26/20 0001      Assessment   Medical Diagnosis Rt shoulder dysfunction    Referring Provider (PT) Dr [name]    Onset Date/Surgical Date 06/19/20    Hand Dominance Right    Next MD Visit to schedule after 4-6 weeks of therapy    Prior Therapy here for Lt shoulder fracture 4/20      Observation/Other Assessments   Focus on Therapeutic Outcomes (FOTO)  Functional limitations score 72      AROM   Right Shoulder Extension 69 Degrees    Right Shoulder Flexion 152 Degrees    Right Shoulder ABduction 146 Degrees  Right Shoulder Internal Rotation --   thumb to T10/11   Right Shoulder External Rotation 82 Degrees   shoulder/elbow 90/90     Strength   Right/Left Shoulder --   pain free with resistive testing   Right Shoulder Flexion 5/5    Right Shoulder Extension 5/5    Right Shoulder ABduction 5/5    Right Shoulder Internal Rotation 5/5    Right Shoulder External Rotation 5/5      Palpation   Palpation comment continued muscular tightness through the anterior shoulder and lateral scapular area Rt shoulder girdle                         [facility] Adult PT Treatment/Exercise - 08/26/20 0001      Shoulder Exercises: Supine   Protraction Strengthening;Right;12 reps    Protraction Weight (lbs) 2    Horizontal ABduction Strengthening;Right;12 reps     Horizontal ABduction Weight (lbs) 2    Flexion Strengthening;Right;15 reps      Shoulder Exercises: Standing   Retraction Strengthening;Right    Retraction Limitations W to Y position on wall with cues for posture      Shoulder Exercises: ROM/Strengthening   UBE (Upper Arm Bike) L4 x 4 min alternating fwd/back      Shoulder Exercises: Body Blade   Flexion 30 seconds;3 reps   Rt/Lt                 PT Education - 08/26/20 1603    Education Details encouraged consistent HEP    Person(s) Educated Patient    Methods Explanation;Demonstration;Tactile cues;Verbal cues    Comprehension Verbalized understanding;Returned demonstration               PT Long Term Goals - 08/26/20 1549      PT LONG TERM GOAL #1   Title Improve posture and alignment with patient to demonstrate improved upright posture with posterior shoulder girdle engaged    Time 6    Period Weeks    Status Partially Met      PT LONG TERM GOAL #2   Title Increase strength Rt shoulder to 4+/5 to 5/5    Time 6    Period Weeks    Status Achieved      PT LONG TERM GOAL #3   Title Patient reports using Rt UE for functional activities including washing face; turning on/off light switch; etc    Time 6    Period Weeks    Status Achieved      PT LONG TERM GOAL #4   Title Independent in HEP    Time 6    Period Weeks    Status Achieved      PT LONG TERM GOAL #5   Title Improve functional limitation score to 63 - - functional limitation score 72    Time 6    Period Weeks    Status Achieved                 Plan - 08/26/20 1604    Clinical Impression Statement Patient reports improvement in shoulder strength and states that she is still having no pain following cortisone injection. She feels she is ready for discharge from PT. Patient has accomplished most rehab goals. She has not increased Rt shoudler AROM but did accomplish remainder of PT goals. She has gained strength in Rt shoulder and reports  independent function for all ADL's. She states that she will continue with her independent  HEP on a consistent basis. Patient will be d/c'ed to independent HEP and call with any questions.    Rehab Potential Good    PT Frequency 2x / week    PT Duration 6 weeks    PT Treatment/Interventions ADLs/Self Care Home Management;Aquatic Therapy;Cryotherapy;Electrical Stimulation;Iontophoresis 44m/ml Dexamethasone;Moist Heat;Ultrasound;Functional mobility training;Therapeutic activities;Therapeutic exercise;Balance training;Neuromuscular re-education;Patient/family education;Manual techniques;Passive range of motion;Dry needling;Taping    PT Next Visit Plan d/c    PT Home Exercise Plan [id number]   Consulted and Agree with Plan of Care Patient           Patient will benefit from skilled therapeutic intervention in order to improve the following deficits and impairments:     Visit Diagnosis: Chronic right shoulder pain  Other symptoms and signs involving the musculoskeletal system  Abnormal posture  Muscle weakness (generalized)     Problem List Patient Active Problem List   Diagnosis Date Noted  . Closed fracture of left proximal humerus 09/26/2018  . Squamous cell carcinoma 03/05/2017  . Echocardiogram shows left ventricular diastolic dysfunction 007/37/1062 . Heart palpitations 07/25/2016  . Need for vaccination for pneumococcus 07/13/2016  . Osteoarthritis of hand, right 08/06/2015  . Diabetes mellitus type 2, uncomplicated ([place] 069/48/5462 . [place] 10/09/2014  . Hematochezia 04/01/2013  . H/O colon cancer, stage II 09/19/2011  . Depression with anxiety 09/19/2011  . Hyperlipidemia 09/19/2011    Addalie Calles PNilda SimmerPT,MPH  08/26/2020, 4:08 PM  [facility] [place] [address] [address]Phone: [phone]  Fax:  [phone] Name: Kelli FORMANMRN: [id number] of Birth:  [birth year] PHYSICAL THERAPY DISCHARGE SUMMARY  Visits from Start of Care: 5  Current functional level related to goals / functional outcomes: See progress note for discharge status    Remaining deficits: Continued decreased AROM Bilat shoulders   Education / Equipment: HEP  Plan: Patient agrees to discharge.  Patient goals were partially met. Patient is being discharged due to being pleased with the current functional level.  ?????    Evonda Enge P. HHelene KelpPT, MPH 08/26/20 4:09 PM

## 2020-09-12 ENCOUNTER — Encounter: Payer: Self-pay | Admitting: Osteopathic Medicine

## 2020-09-13 ENCOUNTER — Telehealth (INDEPENDENT_AMBULATORY_CARE_PROVIDER_SITE_OTHER): Payer: Medicare HMO | Admitting: Osteopathic Medicine

## 2020-09-13 ENCOUNTER — Encounter: Payer: Self-pay | Admitting: Osteopathic Medicine

## 2020-09-13 VITALS — Temp 100.5°F

## 2020-09-13 DIAGNOSIS — J40 Bronchitis, not specified as acute or chronic: Secondary | ICD-10-CM

## 2020-09-13 DIAGNOSIS — Z794 Long term (current) use of insulin: Secondary | ICD-10-CM | POA: Diagnosis not present

## 2020-09-13 DIAGNOSIS — J988 Other specified respiratory disorders: Secondary | ICD-10-CM

## 2020-09-13 DIAGNOSIS — B9789 Other viral agents as the cause of diseases classified elsewhere: Secondary | ICD-10-CM

## 2020-09-13 DIAGNOSIS — E119 Type 2 diabetes mellitus without complications: Secondary | ICD-10-CM | POA: Diagnosis not present

## 2020-09-13 MED ORDER — ALBUTEROL SULFATE HFA 108 (90 BASE) MCG/ACT IN AERS: 2.0000 | INHALATION_SPRAY | Freq: Four times a day (QID) | RESPIRATORY_TRACT | 1 refills | Status: DC | PRN

## 2020-09-13 MED ORDER — AZITHROMYCIN 250 MG PO TABS: ORAL_TABLET | ORAL | 0 refills | Status: DC

## 2020-09-13 NOTE — Progress Notes (Signed)
Pt has been experiencing productive cough grey mucus, sore throat, bilateral ear pain R ear is worse, chills/sweats. No headache,bodyaches,or sinus pressure.   Denies any sick contacts.   Her highest temp was 102. She has been alternating between ASA and tylenol.

## 2020-09-13 NOTE — Progress Notes (Signed)
Telemedicine Visit via  Audio only - telephone (patient preference /  technical difficulty with MyChart video application)  I connected with Kelli [name] on 09/13/20 at 2:48 PM  by phone or  telemedicine application as noted above  I verified that I am speaking with or regarding  the correct patient using two identifiers.  Participants: Myself, Dr Emeterio Reeve DO Patient: Kelli [name] Patient proxy if applicable: none Other, if applicable: none  Patient is at home I am in office at [facility]    I discussed the limitations of evaluation and management  by telemedicine and the availability of in person appointments.  The participant(s) above expressed understanding and  agreed to proceed with this appointment via telemedicine.       History of Present Illness: Kelli [name] is a 75 y.o. female who would like to discuss feeling sick.   Pt has been experiencing productive cough grey mucus, sore throat, bilateral ear pain R ear is worse, chills/sweats. No headache,bodyaches,or sinus pressure.   Denies any sick contacts.   Her highest temp was 102. She has been alternating between ASA and tylenol. last dose was 4 hours ago and temp now 99.       Observations/Objective: Temp (!) 100.5 F (38.1 C)   LMP  (LMP Unknown)  BP Readings from Last 3 Encounters:  07/29/20 138/70  07/20/20 118/76  07/13/20 120/64   Exam: Normal Speech.  NAD  Lab and Radiology Results No results found for this or any previous visit (from the past 72 hour(s)). No results found.     Assessment and Plan: 75 y.o. female with The primary encounter diagnosis was Viral respiratory illness. Diagnoses of Bronchitis and Type 2 diabetes mellitus without complication, with long-term current use of insulin ([place]) were also pertinent to this visit.  Pt would prefer to hold off on physical exam, tests such as Xray for now unless she is not getting better / gets worse   ER  precautions reviewed, in-person visit was offered for later today.   Will set reminder to call later this week to checkup on her (W[place], 2 days from now)   PDMP not reviewed this encounter. No orders of the defined types were placed in this encounter.  Meds ordered this encounter  Medications  . azithromycin (ZITHROMAX) 250 MG tablet    Sig: 2 tabs po x1 on Day 1, then 1 tab po daily on Days 2 - 5    Dispense:  6 tablet    Refill:  0  . albuterol (VENTOLIN HFA) 108 (90 Base) MCG/ACT inhaler    Sig: Inhale 2 puffs into the lungs every 6 (six) hours as needed for wheezing or shortness of breath.    Dispense:  1 each    Refill:  1   There are no Patient Instructions on file for this visit.  Instructions sent via MyChart.   Follow Up Instructions: No follow-ups on file.    I discussed the assessment and treatment plan with the patient. The patient was provided an opportunity to ask questions and all were answered. The patient agreed with the plan and demonstrated an understanding of the instructions.   The patient was advised to call back or seek an in-person evaluation if any new concerns, if symptoms worsen or if the condition fails to improve as anticipated.  21 minutes of non-face-to-face time was provided during this encounter.      . . . . . . . . . . . . . [name]  Historical information moved to improve visibility of documentation.  Past Medical History:  Diagnosis Date  . Anxiety   . Arthritis   . Borderline diabetic   . Colon cancer ([place])    colon ca dx 02/2009, lung cancer  . Depression   . Depression with anxiety 09/19/2011  . H/O colon cancer, stage II 09/19/2011   T3N0  Cecum Sept 2010  . High cholesterol   . Hyperlipidemia 09/19/2011  . Lung nodules 09/19/2011   granulomas  . Urinary urgency    Past Surgical History:  Procedure Laterality Date  . APPENDECTOMY    . AUGMENTATION MAMMAPLASTY    . BREAST SURGERY   1978   augmentation  . COLON SURGERY     2010  . CRYO INTERCOSTAL NERVE BLOCK N/A 10/05/2014   Procedure: CRYO INTERCOSTAL NERVE BLOCK;  Surgeon: [name], MD;  Location: [place];  Service: Thoracic;  Laterality: N/A;  . LYMPH NODE DISSECTION Right 10/05/2014   Procedure: LYMPH NODE DISSECTION;  Surgeon: [name], MD;  Location: [place];  Service: Thoracic;  Laterality: Right;  . ORIF HUMERUS FRACTURE Left 10/01/2018   Procedure: OPEN REDUCTION INTERNAL FIXATION (ORIF) LEFT PROXIMAL HUMERUS FRACTURE;  Surgeon: [name], MD;  Location: [place];  Service: Orthopedics;  Laterality: Left;  . PARTIAL COLECTOMY Right 2010  . SEGMENTECOMY Right 10/05/2014   Procedure: right lower lobe SEGMENTECTOMY;  Surgeon: [name], MD;  Location: [place];  Service: Thoracic;  Laterality: Right;  [name] VIDEO ASSISTED THORACOSCOPY (VATS)/WEDGE RESECTION Right 10/05/2014   Procedure: Right VIDEO ASSISTED THORACOSCOPY (VATS) with right lower lobe lung nodule WEDGE RESECTION;  Surgeon: [name], MD;  Location: [place];  Service: Thoracic;  Laterality: Right;   Social History   Tobacco Use  . Smoking status: Former Smoker    Packs/day: 3.00    Years: 40.00    Pack years: 120.00    Types: Cigarettes    Quit date: 02/06/2009    Years since quitting: 11.6  . Smokeless tobacco: Never Used  Substance Use Topics  . Alcohol use: Yes    Alcohol/week: 0.0 standard drinks    Comment: 3-4 times a week 1-2 beers   family history includes Colon polyps in her sister; Heart disease in her brother and mother.  Medications: Current Outpatient Medications  Medication Sig Dispense Refill  . albuterol (VENTOLIN HFA) 108 (90 Base) MCG/ACT inhaler Inhale 2 puffs into the lungs every 6 (six) hours as needed for wheezing or shortness of breath. 1 each 1  . AMBULATORY NON FORMULARY MEDICATION Single glucometer with lancets, test strips. Test daily.  Disp qs 3 months E11.9 1 each 0  .  azithromycin (ZITHROMAX) 250 MG tablet 2 tabs po x1 on Day 1, then 1 tab po daily on Days 2 - 5 6 tablet 0  . B Complex-C (B-COMPLEX WITH VITAMIN C) tablet Take 1 tablet by mouth daily.    . B-D UF III MINI PEN NEEDLES 31G X 5 MM MISC USE AS DIRECTED 100 each 12  . Cholecalciferol (VITAMIN D) 2000 UNITS tablet Take 2,000 Units by mouth daily.    . cloNIDine (CATAPRES) 0.1 MG tablet Take 1 tablet (0.1 mg total) by mouth at bedtime. 90 tablet 3  . FLUoxetine (PROZAC) 40 MG capsule Take 1 capsule (40 mg total) by mouth daily. 90 capsule 3  . gabapentin (NEURONTIN) 300 MG capsule TAKE 1 CAPSULE (300 MG TOTAL) BY MOUTH AT BEDTIME AS NEEDED (INSOMNIA). 90 capsule  3  . Insulin Glargine (BASAGLAR KWIKPEN) 100 UNIT/ML 10 UNITS AT BED INCREASE BY 2 UNITS EVERY 3 DAYS TIL FASTING SUGAR GOAL 120-150 MAX 60 UNITS PER DAY 3 mL 11  . OneTouch Delica Lancets 23N MISC TEST BLOOD SUGARS DAILY 100 each 0  . ONETOUCH VERIO test strip TEST BLOOD SUGARS DAILY 100 strip 1  . pravastatin (PRAVACHOL) 80 MG tablet Take 1 tablet (80 mg total) by mouth daily. 90 tablet 3   No current facility-administered medications for this visit.   Allergies  Allergen Reactions  . Atorvastatin Other (See Comments)    Muscle cramps  . Glipizide Er Other (See Comments)    Shaking / tingling in fingers      If phone visit, billing and coding can please add appropriate modifier if needed

## 2020-09-15 ENCOUNTER — Telehealth: Payer: Self-pay | Admitting: Osteopathic Medicine

## 2020-09-15 ENCOUNTER — Telehealth (INDEPENDENT_AMBULATORY_CARE_PROVIDER_SITE_OTHER): Payer: Medicare HMO | Admitting: Osteopathic Medicine

## 2020-09-15 ENCOUNTER — Encounter: Payer: Self-pay | Admitting: Osteopathic Medicine

## 2020-09-15 DIAGNOSIS — J40 Bronchitis, not specified as acute or chronic: Secondary | ICD-10-CM

## 2020-09-15 NOTE — Progress Notes (Signed)
Telemedicine Visit via  Audio only - telephone (patient preference /  technical difficulty with MyChart video application)  I connected with Kelli [name] on 09/15/20 at 2:51 PM  by phone or  telemedicine application as noted above  I verified that I am speaking with or regarding  the correct patient using two identifiers.  Participants: Myself, Dr Emeterio Reeve DO Patient: Kelli [name] Patient proxy if applicable: none Other, if applicable: none  Patient is at home I am in office at [facility]    I discussed the limitations of evaluation and management  by telemedicine and the availability of in person appointments.  The participant(s) above expressed understanding and  agreed to proceed with this appointment via telemedicine.       History of Present Illness: Kelli [name] is a 75 y.o. female who would like to discuss follow up feeling sick     Temperature is ok as long as taking Tylenol and Aspirin  Coughing a lot but not SOB Resting in bed, could be eating more  Taking Rx and OTC as discussed at visit 2 days ago COVID negative, niece also negative for COVID, pt is vaccinated    Observations/Objective: LMP  (LMP Unknown)  BP Readings from Last 3 Encounters:  07/29/20 138/70  07/20/20 118/76  07/13/20 120/64   Exam: Normal Speech.  NAD  Lab and Radiology Results No results found for this or any previous visit (from the past 72 hour(s)). No results found.     Assessment and Plan: 75 y.o. female with The encounter diagnosis was Bronchitis.  Reviewed ER precautions Doesn't sound likely COVID, possible flu but well outside window for antivirals  No red flags for sepsis Advised if SOB, feeling faint/weak, or fever not controlled by antipyretics --> to ER  PDMP not reviewed this encounter. No orders of the defined types were placed in this encounter.  No orders of the defined types were placed in this encounter.  There are no  Patient Instructions on file for this visit.  Instructions sent via MyChart.   Follow Up Instructions: Return for FOLLOW UP VIA [facility] (SET TO SEND LATER).    I discussed the assessment and treatment plan with the patient. The patient was provided an opportunity to ask questions and all were answered. The patient agreed with the plan and demonstrated an understanding of the instructions.   The patient was advised to call back or seek an in-person evaluation if any new concerns, if symptoms worsen or if the condition fails to improve as anticipated.  10 minutes of non-face-to-face time was provided during this encounter.      . . . . . . . . . . . . . [name]                   Historical information moved to improve visibility of documentation.  Past Medical History:  Diagnosis Date  . Anxiety   . Arthritis   . Borderline diabetic   . Colon cancer ([place])    colon ca dx 02/2009, lung cancer  . Depression   . Depression with anxiety 09/19/2011  . H/O colon cancer, stage II 09/19/2011   T3N0  Cecum Sept 2010  . High cholesterol   . Hyperlipidemia 09/19/2011  . Lung nodules 09/19/2011   granulomas  . Urinary urgency    Past Surgical History:  Procedure Laterality Date  . APPENDECTOMY    . AUGMENTATION MAMMAPLASTY    . BREAST SURGERY  1978   augmentation  . COLON SURGERY     2010  . CRYO INTERCOSTAL NERVE BLOCK N/A 10/05/2014   Procedure: CRYO INTERCOSTAL NERVE BLOCK;  Surgeon: [name], MD;  Location: [place];  Service: Thoracic;  Laterality: N/A;  . LYMPH NODE DISSECTION Right 10/05/2014   Procedure: LYMPH NODE DISSECTION;  Surgeon: [name], MD;  Location: [place];  Service: Thoracic;  Laterality: Right;  . ORIF HUMERUS FRACTURE Left 10/01/2018   Procedure: OPEN REDUCTION INTERNAL FIXATION (ORIF) LEFT PROXIMAL HUMERUS FRACTURE;  Surgeon: [name], MD;  Location: [place];  Service: Orthopedics;  Laterality: Left;  .  PARTIAL COLECTOMY Right 2010  . SEGMENTECOMY Right 10/05/2014   Procedure: right lower lobe SEGMENTECTOMY;  Surgeon: [name], MD;  Location: [place];  Service: Thoracic;  Laterality: Right;  [name] VIDEO ASSISTED THORACOSCOPY (VATS)/WEDGE RESECTION Right 10/05/2014   Procedure: Right VIDEO ASSISTED THORACOSCOPY (VATS) with right lower lobe lung nodule WEDGE RESECTION;  Surgeon: [name], MD;  Location: [place];  Service: Thoracic;  Laterality: Right;   Social History   Tobacco Use  . Smoking status: Former Smoker    Packs/day: 3.00    Years: 40.00    Pack years: 120.00    Types: Cigarettes    Quit date: 02/06/2009    Years since quitting: 11.6  . Smokeless tobacco: Never Used  Substance Use Topics  . Alcohol use: Yes    Alcohol/week: 0.0 standard drinks    Comment: 3-4 times a week 1-2 beers   family history includes Colon polyps in her sister; Heart disease in her brother and mother.  Medications: Current Outpatient Medications  Medication Sig Dispense Refill  . albuterol (VENTOLIN HFA) 108 (90 Base) MCG/ACT inhaler Inhale 2 puffs into the lungs every 6 (six) hours as needed for wheezing or shortness of breath. 1 each 1  . AMBULATORY NON FORMULARY MEDICATION Single glucometer with lancets, test strips. Test daily.  Disp qs 3 months E11.9 1 each 0  . azithromycin (ZITHROMAX) 250 MG tablet 2 tabs po x1 on Day 1, then 1 tab po daily on Days 2 - 5 6 tablet 0  . B Complex-C (B-COMPLEX WITH VITAMIN C) tablet Take 1 tablet by mouth daily.    . B-D UF III MINI PEN NEEDLES 31G X 5 MM MISC USE AS DIRECTED 100 each 12  . Cholecalciferol (VITAMIN D) 2000 UNITS tablet Take 2,000 Units by mouth daily.    . cloNIDine (CATAPRES) 0.1 MG tablet Take 1 tablet (0.1 mg total) by mouth at bedtime. 90 tablet 3  . FLUoxetine (PROZAC) 40 MG capsule Take 1 capsule (40 mg total) by mouth daily. 90 capsule 3  . gabapentin (NEURONTIN) 300 MG capsule TAKE 1 CAPSULE (300 MG TOTAL) BY MOUTH AT  BEDTIME AS NEEDED (INSOMNIA). 90 capsule 3  . Insulin Glargine (BASAGLAR KWIKPEN) 100 UNIT/ML 10 UNITS AT BED INCREASE BY 2 UNITS EVERY 3 DAYS TIL FASTING SUGAR GOAL 120-150 MAX 60 UNITS PER DAY 3 mL 11  . OneTouch Delica Lancets 70Y MISC TEST BLOOD SUGARS DAILY 100 each 0  . ONETOUCH VERIO test strip TEST BLOOD SUGARS DAILY 100 strip 1  . pravastatin (PRAVACHOL) 80 MG tablet Take 1 tablet (80 mg total) by mouth daily. 90 tablet 3   No current facility-administered medications for this visit.   Allergies  Allergen Reactions  . Atorvastatin Other (See Comments)    Muscle cramps  . Glipizide Er Other (See Comments)  Shaking / tingling in fingers      If phone visit, billing and coding can please add appropriate modifier if needed

## 2020-09-15 NOTE — Telephone Encounter (Addendum)
-----   Message from Emeterio Reeve, DO sent at 09/13/2020  4:50 PM EDT ----- Please call patient: Treated Monday 09/13/20 for bronchitis / possible pneumonia. Please ask her how is she doing? If no better / worse please work her in - I'll be here (Weds 4/13) until 3:30 ok to use admin time slot

## 2020-09-16 ENCOUNTER — Emergency Department (HOSPITAL_COMMUNITY): Payer: Medicare HMO

## 2020-09-16 ENCOUNTER — Other Ambulatory Visit: Payer: Self-pay

## 2020-09-16 ENCOUNTER — Telehealth: Payer: Self-pay

## 2020-09-16 ENCOUNTER — Inpatient Hospital Stay (HOSPITAL_COMMUNITY)
Admission: EM | Admit: 2020-09-16 | Discharge: 2020-09-21 | DRG: 871 | Disposition: A | Discharge status: Home Health | Payer: Medicare HMO | Attending: Internal Medicine | Admitting: Internal Medicine

## 2020-09-16 DIAGNOSIS — C3431 Malignant neoplasm of lower lobe, right bronchus or lung: Secondary | ICD-10-CM | POA: Diagnosis not present

## 2020-09-16 DIAGNOSIS — R0902 Hypoxemia: Secondary | ICD-10-CM | POA: Diagnosis not present

## 2020-09-16 DIAGNOSIS — I10 Essential (primary) hypertension: Secondary | ICD-10-CM | POA: Diagnosis present

## 2020-09-16 DIAGNOSIS — R509 Fever, unspecified: Secondary | ICD-10-CM | POA: Diagnosis not present

## 2020-09-16 DIAGNOSIS — E785 Hyperlipidemia, unspecified: Secondary | ICD-10-CM | POA: Diagnosis present

## 2020-09-16 DIAGNOSIS — Z79899 Other long term (current) drug therapy: Secondary | ICD-10-CM | POA: Diagnosis not present

## 2020-09-16 DIAGNOSIS — F419 Anxiety disorder, unspecified: Secondary | ICD-10-CM | POA: Diagnosis present

## 2020-09-16 DIAGNOSIS — E1142 Type 2 diabetes mellitus with diabetic polyneuropathy: Secondary | ICD-10-CM | POA: Diagnosis present

## 2020-09-16 DIAGNOSIS — Z794 Long term (current) use of insulin: Secondary | ICD-10-CM | POA: Diagnosis not present

## 2020-09-16 DIAGNOSIS — R079 Chest pain, unspecified: Secondary | ICD-10-CM | POA: Diagnosis not present

## 2020-09-16 DIAGNOSIS — A419 Sepsis, unspecified organism: Secondary | ICD-10-CM | POA: Diagnosis not present

## 2020-09-16 DIAGNOSIS — Z902 Acquired absence of lung [part of]: Secondary | ICD-10-CM

## 2020-09-16 DIAGNOSIS — E119 Type 2 diabetes mellitus without complications: Secondary | ICD-10-CM

## 2020-09-16 DIAGNOSIS — Z8371 Family history of colonic polyps: Secondary | ICD-10-CM

## 2020-09-16 DIAGNOSIS — J189 Pneumonia, unspecified organism: Secondary | ICD-10-CM | POA: Diagnosis present

## 2020-09-16 DIAGNOSIS — R319 Hematuria, unspecified: Secondary | ICD-10-CM | POA: Diagnosis present

## 2020-09-16 DIAGNOSIS — J188 Other pneumonia, unspecified organism: Secondary | ICD-10-CM | POA: Diagnosis present

## 2020-09-16 DIAGNOSIS — Z8249 Family history of ischemic heart disease and other diseases of the circulatory system: Secondary | ICD-10-CM

## 2020-09-16 DIAGNOSIS — E1165 Type 2 diabetes mellitus with hyperglycemia: Secondary | ICD-10-CM | POA: Diagnosis present

## 2020-09-16 DIAGNOSIS — R109 Unspecified abdominal pain: Secondary | ICD-10-CM | POA: Diagnosis not present

## 2020-09-16 DIAGNOSIS — Z20822 Contact with and (suspected) exposure to covid-19: Secondary | ICD-10-CM | POA: Diagnosis not present

## 2020-09-16 DIAGNOSIS — M1990 Unspecified osteoarthritis, unspecified site: Secondary | ICD-10-CM | POA: Diagnosis present

## 2020-09-16 DIAGNOSIS — Z87891 Personal history of nicotine dependence: Secondary | ICD-10-CM | POA: Diagnosis not present

## 2020-09-16 DIAGNOSIS — Z85038 Personal history of other malignant neoplasm of large intestine: Secondary | ICD-10-CM

## 2020-09-16 DIAGNOSIS — R0603 Acute respiratory distress: Secondary | ICD-10-CM | POA: Diagnosis not present

## 2020-09-16 DIAGNOSIS — M545 Low back pain, unspecified: Secondary | ICD-10-CM | POA: Diagnosis present

## 2020-09-16 DIAGNOSIS — E876 Hypokalemia: Secondary | ICD-10-CM | POA: Diagnosis present

## 2020-09-16 DIAGNOSIS — N390 Urinary tract infection, site not specified: Secondary | ICD-10-CM | POA: Diagnosis present

## 2020-09-16 DIAGNOSIS — J181 Lobar pneumonia, unspecified organism: Secondary | ICD-10-CM | POA: Diagnosis present

## 2020-09-16 DIAGNOSIS — R0602 Shortness of breath: Secondary | ICD-10-CM | POA: Diagnosis not present

## 2020-09-16 DIAGNOSIS — E7800 Pure hypercholesterolemia, unspecified: Secondary | ICD-10-CM | POA: Diagnosis present

## 2020-09-16 DIAGNOSIS — Z85118 Personal history of other malignant neoplasm of bronchus and lung: Secondary | ICD-10-CM | POA: Diagnosis present

## 2020-09-16 DIAGNOSIS — Z888 Allergy status to other drugs, medicaments and biological substances status: Secondary | ICD-10-CM | POA: Diagnosis not present

## 2020-09-16 DIAGNOSIS — F1010 Alcohol abuse, uncomplicated: Secondary | ICD-10-CM | POA: Diagnosis present

## 2020-09-16 DIAGNOSIS — C3430 Malignant neoplasm of lower lobe, unspecified bronchus or lung: Secondary | ICD-10-CM | POA: Diagnosis present

## 2020-09-16 LAB — CBC
HCT: 36 % (ref 36.0–46.0)
HCT: 32.2 % — ABNORMAL LOW (ref 36.0–46.0)
Hemoglobin: 11.8 g/dL — ABNORMAL LOW (ref 12.0–15.0)
Hemoglobin: 10.6 g/dL — ABNORMAL LOW (ref 12.0–15.0)
MCH: 30.7 pg (ref 26.0–34.0)
MCH: 31 pg (ref 26.0–34.0)
MCHC: 32.8 g/dL (ref 30.0–36.0)
MCHC: 32.9 g/dL (ref 30.0–36.0)
MCV: 93.8 fL (ref 80.0–100.0)
MCV: 94.2 fL (ref 80.0–100.0)
Platelets: 482 10*3/uL — ABNORMAL HIGH (ref 150–400)
Platelets: 415 10*3/uL — ABNORMAL HIGH (ref 150–400)
RBC: 3.84 MIL/uL — ABNORMAL LOW (ref 3.87–5.11)
RBC: 3.42 MIL/uL — ABNORMAL LOW (ref 3.87–5.11)
RDW: 13.4 % (ref 11.5–15.5)
RDW: 13.6 % (ref 11.5–15.5)
WBC: 17.3 10*3/uL — ABNORMAL HIGH (ref 4.0–10.5)
WBC: 15.9 10*3/uL — ABNORMAL HIGH (ref 4.0–10.5)
nRBC: 0 % (ref 0.0–0.2)
nRBC: 0 % (ref 0.0–0.2)

## 2020-09-16 LAB — COMPREHENSIVE METABOLIC PANEL
ALT: 28 U/L (ref 0–44)
AST: 21 U/L (ref 15–41)
Albumin: 2.9 g/dL — ABNORMAL LOW (ref 3.5–5.0)
Alkaline Phosphatase: 114 U/L (ref 38–126)
Anion gap: 15 (ref 5–15)
BUN: 12 mg/dL (ref 8–23)
CO2: 21 mmol/L — ABNORMAL LOW (ref 22–32)
Calcium: 8.8 mg/dL — ABNORMAL LOW (ref 8.9–10.3)
Chloride: 102 mmol/L (ref 98–111)
Creatinine, Ser: 0.78 mg/dL (ref 0.44–1.00)
GFR, Estimated: >60 mL/min (ref >60)
Glucose, Bld: 153 mg/dL — ABNORMAL HIGH (ref 70–99)
Potassium: 3.2 mmol/L — ABNORMAL LOW (ref 3.5–5.1)
Sodium: 138 mmol/L (ref 135–145)
Total Bilirubin: 1 mg/dL (ref 0.3–1.2)
Total Protein: 7.3 g/dL (ref 6.5–8.1)

## 2020-09-16 LAB — RESP PANEL BY RT-PCR (FLU A&B, COVID) ARPGX2
Influenza A by PCR: NEGATIVE
Influenza B by PCR: NEGATIVE
SARS Coronavirus 2 by RT PCR: NEGATIVE

## 2020-09-16 LAB — CREATININE, SERUM
Creatinine, Ser: 0.77 mg/dL (ref 0.44–1.00)
GFR, Estimated: >60 mL/min (ref >60)

## 2020-09-16 LAB — HEMOGLOBIN A1C
Hgb A1c MFr Bld: 8.2 % — ABNORMAL HIGH (ref 4.8–5.6)
Mean Plasma Glucose: 188.64 mg/dL

## 2020-09-16 LAB — GLUCOSE, CAPILLARY: Glucose-Capillary: 166 mg/dL — ABNORMAL HIGH (ref 70–99)

## 2020-09-16 LAB — LIPASE, BLOOD: Lipase: 22 U/L (ref 11–51)

## 2020-09-16 MED ORDER — SODIUM CHLORIDE 0.9 % IV SOLN
1.0000 g | Freq: Once | INTRAVENOUS | Status: AC
  Administered 2020-09-16: 1 g via INTRAVENOUS
  Filled 2020-09-16: qty 10

## 2020-09-16 MED ORDER — SODIUM CHLORIDE 0.9 % IV SOLN
2.0000 g | INTRAVENOUS | Status: DC
  Administered 2020-09-17 – 2020-09-18 (×2): 2 g via INTRAVENOUS
  Filled 2020-09-16 (×2): qty 20

## 2020-09-16 MED ORDER — SODIUM CHLORIDE 0.9 % IV BOLUS
1000.0000 mL | Freq: Once | INTRAVENOUS | Status: AC
  Administered 2020-09-16: 1000 mL via INTRAVENOUS

## 2020-09-16 MED ORDER — ACETAMINOPHEN 325 MG PO TABS
650.0000 mg | ORAL_TABLET | Freq: Four times a day (QID) | ORAL | Status: DC | PRN
  Administered 2020-09-17: 650 mg via ORAL
  Filled 2020-09-16: qty 2

## 2020-09-16 MED ORDER — SODIUM CHLORIDE 0.9 % IV SOLN
500.0000 mg | INTRAVENOUS | Status: DC
  Administered 2020-09-16 – 2020-09-18 (×3): 500 mg via INTRAVENOUS
  Filled 2020-09-16 (×3): qty 500

## 2020-09-16 MED ORDER — BASAGLAR KWIKPEN 100 UNIT/ML ~~LOC~~ SOPN: 20.0000 [IU] | PEN_INJECTOR | Freq: Every day | SUBCUTANEOUS | Status: DC

## 2020-09-16 MED ORDER — INSULIN ASPART 100 UNIT/ML ~~LOC~~ SOLN
0.0000 [IU] | Freq: Three times a day (TID) | SUBCUTANEOUS | Status: DC
  Administered 2020-09-17: 2 [IU] via SUBCUTANEOUS

## 2020-09-16 MED ORDER — ENOXAPARIN SODIUM 40 MG/0.4ML ~~LOC~~ SOLN
40.0000 mg | SUBCUTANEOUS | Status: DC
  Administered 2020-09-17 – 2020-09-20 (×5): 40 mg via SUBCUTANEOUS
  Filled 2020-09-16 (×5): qty 0.4

## 2020-09-16 MED ORDER — PRAVASTATIN SODIUM 40 MG PO TABS
80.0000 mg | ORAL_TABLET | Freq: Every day | ORAL | Status: DC
  Administered 2020-09-17 – 2020-09-21 (×5): 80 mg via ORAL
  Filled 2020-09-16 (×5): qty 2

## 2020-09-16 MED ORDER — POTASSIUM CHLORIDE CRYS ER 20 MEQ PO TBCR
40.0000 meq | EXTENDED_RELEASE_TABLET | ORAL | Status: AC
  Administered 2020-09-17 (×2): 40 meq via ORAL
  Filled 2020-09-16 (×2): qty 2

## 2020-09-16 MED ORDER — ONDANSETRON HCL 4 MG PO TABS: 4.0000 mg | ORAL_TABLET | Freq: Four times a day (QID) | ORAL | Status: DC | PRN

## 2020-09-16 MED ORDER — FLUOXETINE HCL 20 MG PO CAPS
40.0000 mg | ORAL_CAPSULE | Freq: Every day | ORAL | Status: DC
  Administered 2020-09-17 – 2020-09-21 (×5): 40 mg via ORAL
  Filled 2020-09-16 (×6): qty 2

## 2020-09-16 MED ORDER — GABAPENTIN 300 MG PO CAPS
300.0000 mg | ORAL_CAPSULE | Freq: Every evening | ORAL | Status: DC | PRN
  Administered 2020-09-17 – 2020-09-18 (×3): 300 mg via ORAL
  Filled 2020-09-16 (×3): qty 1

## 2020-09-16 MED ORDER — SODIUM CHLORIDE 0.9 % IV SOLN: INTRAVENOUS | Status: AC

## 2020-09-16 MED ORDER — CLONIDINE HCL 0.1 MG PO TABS
0.1000 mg | ORAL_TABLET | Freq: Every day | ORAL | Status: DC
  Administered 2020-09-17 – 2020-09-20 (×4): 0.1 mg via ORAL
  Filled 2020-09-16 (×5): qty 1

## 2020-09-16 MED ORDER — POLYETHYLENE GLYCOL 3350 17 G PO PACK: 17.0000 g | PACK | Freq: Every day | ORAL | Status: DC | PRN

## 2020-09-16 MED ORDER — FLUOXETINE HCL 40 MG PO CAPS: 40.0000 mg | ORAL_CAPSULE | Freq: Every day | ORAL | Status: DC

## 2020-09-16 MED ORDER — IPRATROPIUM-ALBUTEROL 0.5-2.5 (3) MG/3ML IN SOLN: 3.0000 mL | Freq: Four times a day (QID) | RESPIRATORY_TRACT | Status: DC | PRN

## 2020-09-16 MED ORDER — DOXYCYCLINE HYCLATE 100 MG PO TABS
100.0000 mg | ORAL_TABLET | Freq: Once | ORAL | Status: AC
  Administered 2020-09-16: 100 mg via ORAL
  Filled 2020-09-16: qty 1

## 2020-09-16 MED ORDER — INSULIN GLARGINE 100 UNIT/ML ~~LOC~~ SOLN
20.0000 [IU] | Freq: Every day | SUBCUTANEOUS | Status: DC
  Administered 2020-09-17: 20 [IU] via SUBCUTANEOUS
  Filled 2020-09-16 (×3): qty 0.2

## 2020-09-16 MED ORDER — ONDANSETRON 4 MG PO TBDP
4.0000 mg | ORAL_TABLET | Freq: Once | ORAL | Status: AC
  Administered 2020-09-16: 4 mg via ORAL
  Filled 2020-09-16: qty 1

## 2020-09-16 MED ORDER — ACETAMINOPHEN 650 MG RE SUPP: 650.0000 mg | Freq: Four times a day (QID) | RECTAL | Status: DC | PRN

## 2020-09-16 MED ORDER — ONDANSETRON HCL 4 MG/2ML IJ SOLN
4.0000 mg | Freq: Four times a day (QID) | INTRAMUSCULAR | Status: DC | PRN
Start: 2020-09-16 — End: 2020-09-21
  Administered 2020-09-16: 4 mg via INTRAVENOUS
  Filled 2020-09-16: qty 2

## 2020-09-16 NOTE — ED Provider Notes (Signed)
[facility] EMERGENCY DEPARTMENT Provider Note   CSN: [id number] Arrival date & time: 09/16/20  1327     History No chief complaint on file.   Kelli [name] is a 75 y.o. female with a past medical history of IDDM, lateral arthritis presenting to the ED with a chief complaint of shortness of breath, fever, cough and myalgias, and vomiting.  Patient states that symptoms have been going on for the past 1 week.  Had a virtual visit with her PCP a few days ago and was prescribed a Z-Pak as well as albuterol.  She noticed that her chills are worse at night and has been having trouble keeping any food or drink down for the past 4 days.  In addition to albuterol and azithromycin, has also been taking Tylenol and aspirin to control fever.  States that she stopped taking aspirin a few days ago because "I was not eating anything."  Continues to use her insulin but has not been able to take her other home medications.  States that she had a partial lobectomy done 7 years ago for lung cancer but denies any other chronic lung disease.  States that the albuterol inhaler has been helping, but has not been taking it consistently.  States that she has needed to ambulate with a cane because she feels overall weak.  Denies any chest pain, abdominal pain.  States that she had a "coughing fit" few days ago and has been causing her lower back pain since then.  She tested negative for Covid this week, with home test. Niece also tested negative.  Reports her niece had similar symptoms.  Denies any hemoptysis, diarrhea.  HPI     Past Medical History:  Diagnosis Date  . Anxiety   . Arthritis   . Borderline diabetic   . Colon cancer ([place])    colon ca dx 02/2009, lung cancer  . Depression   . Depression with anxiety 09/19/2011  . H/O colon cancer, stage II 09/19/2011   T3N0  Cecum Sept 2010  . High cholesterol   . Hyperlipidemia 09/19/2011  . Lung nodules 09/19/2011   granulomas  . Urinary urgency      Patient Active Problem List   Diagnosis Date Noted  . Closed fracture of left proximal humerus 09/26/2018  . Squamous cell carcinoma 03/05/2017  . Echocardiogram shows left ventricular diastolic dysfunction 42/59/5638  . Heart palpitations 07/25/2016  . Need for vaccination for pneumococcus 07/13/2016  . Osteoarthritis of hand, right 08/06/2015  . Diabetes mellitus type 2, uncomplicated ([place]) 75/64/3329  . Lung cancer, lower lobe ([place]) 10/09/2014  . Hematochezia 04/01/2013  . H/O colon cancer, stage II 09/19/2011  . Depression with anxiety 09/19/2011  . Hyperlipidemia 09/19/2011    Past Surgical History:  Procedure Laterality Date  . APPENDECTOMY    . AUGMENTATION MAMMAPLASTY    . BREAST SURGERY  1978   augmentation  . COLON SURGERY     2010  . CRYO INTERCOSTAL NERVE BLOCK N/A 10/05/2014   Procedure: CRYO INTERCOSTAL NERVE BLOCK;  Surgeon: [name], MD;  Location: [place];  Service: Thoracic;  Laterality: N/A;  . LYMPH NODE DISSECTION Right 10/05/2014   Procedure: LYMPH NODE DISSECTION;  Surgeon: [name], MD;  Location: [place];  Service: Thoracic;  Laterality: Right;  . ORIF HUMERUS FRACTURE Left 10/01/2018   Procedure: OPEN REDUCTION INTERNAL FIXATION (ORIF) LEFT PROXIMAL HUMERUS FRACTURE;  Surgeon: [name], MD;  Location: [place];  Service: Orthopedics;  Laterality: Left;  . PARTIAL COLECTOMY Right 2010  . SEGMENTECOMY Right 10/05/2014   Procedure: right lower lobe SEGMENTECTOMY;  Surgeon: [name], MD;  Location: [place];  Service: Thoracic;  Laterality: Right;  [name] VIDEO ASSISTED THORACOSCOPY (VATS)/WEDGE RESECTION Right 10/05/2014   Procedure: Right VIDEO ASSISTED THORACOSCOPY (VATS) with right lower lobe lung nodule WEDGE RESECTION;  Surgeon: [name], MD;  Location: [place];  Service: Thoracic;  Laterality: Right;     OB History   No obstetric history on file.     Family History  Problem Relation Age of Onset  . Heart  disease Mother   . Colon polyps Sister   . Heart disease Brother   . Colon cancer Neg Hx   . Esophageal cancer Neg Hx   . Stomach cancer Neg Hx   . Rectal cancer Neg Hx     Social History   Tobacco Use  . Smoking status: Former Smoker    Packs/day: 3.00    Years: 40.00    Pack years: 120.00    Types: Cigarettes    Quit date: 02/06/2009    Years since quitting: 11.6  . Smokeless tobacco: Never Used  Vaping Use  . Vaping Use: Never used  Substance Use Topics  . Alcohol use: Yes    Alcohol/week: 0.0 standard drinks    Comment: 3-4 times a week 1-2 beers  . Drug use: No    Home Medications Prior to Admission medications   Medication Sig Start Date End Date Taking? Authorizing Provider  albuterol (VENTOLIN HFA) 108 (90 Base) MCG/ACT inhaler Inhale 2 puffs into the lungs every 6 (six) hours as needed for wheezing or shortness of breath. 09/13/20   Emeterio Reeve, DO  AMBULATORY NON FORMULARY MEDICATION Single glucometer with lancets, test strips. Test daily.  Disp qs 3 months E11.9 07/10/18   Gregor Hams, MD  azithromycin (ZITHROMAX) 250 MG tablet 2 tabs po x1 on Day 1, then 1 tab po daily on Days 2 - 5 09/13/20   Emeterio Reeve, DO  B Complex-C (B-COMPLEX WITH VITAMIN C) tablet Take 1 tablet by mouth daily.    [provider]  B-D UF III MINI PEN NEEDLES 31G X 5 MM MISC USE AS DIRECTED 01/22/20   Emeterio Reeve, DO  Cholecalciferol (VITAMIN D) 2000 UNITS tablet Take 2,000 Units by mouth daily.    [provider]  cloNIDine (CATAPRES) 0.1 MG tablet Take 1 tablet (0.1 mg total) by mouth at bedtime. 09/16/19   Emeterio Reeve, DO  FLUoxetine (PROZAC) 40 MG capsule Take 1 capsule (40 mg total) by mouth daily. 09/16/19   Emeterio Reeve, DO  gabapentin (NEURONTIN) 300 MG capsule TAKE 1 CAPSULE (300 MG TOTAL) BY MOUTH AT BEDTIME AS NEEDED (INSOMNIA). 02/18/20   Emeterio Reeve, DO  Insulin Glargine [facility]) 100 UNIT/ML 10 UNITS AT BED INCREASE  BY 2 UNITS EVERY 3 DAYS TIL FASTING SUGAR GOAL 120-150 MAX 60 UNITS PER DAY 05/04/20   Emeterio Reeve, DO  OneTouch Delica Lancets 11H MISC TEST BLOOD SUGARS DAILY 08/05/20   Emeterio Reeve, DO  [facility] VERIO test strip TEST BLOOD SUGARS DAILY 07/11/19   Emeterio Reeve, DO  pravastatin (PRAVACHOL) 80 MG tablet Take 1 tablet (80 mg total) by mouth daily. 09/16/19   Emeterio Reeve, DO    Allergies    Atorvastatin and Glipizide er  Review of Systems   Review of Systems  Constitutional: Positive for chills, fatigue and fever. Negative for appetite change.  HENT: Negative for ear pain, rhinorrhea, sneezing and sore throat.   Eyes: Negative for photophobia and visual disturbance.  Respiratory: Positive for cough and shortness of breath. Negative for chest tightness and wheezing.   Cardiovascular: Negative for chest pain and palpitations.  Gastrointestinal: Positive for nausea and vomiting. Negative for abdominal pain, blood in stool, constipation and diarrhea.  Genitourinary: Negative for dysuria, hematuria and urgency.  Musculoskeletal: Positive for myalgias.  Skin: Negative for rash.  Neurological: Negative for dizziness, weakness and light-headedness.    Physical Exam Updated Vital Signs BP (!) 143/59   Pulse 81   Temp 98.6 F (37 C) (Oral)   Resp (!) 21   LMP  (LMP Unknown)   SpO2 93%   Physical Exam Vitals and nursing note reviewed.  Constitutional:      General: She is not in acute distress.    Appearance: She is well-developed.     Comments: Cough noted on exam.  HENT:     Head: Normocephalic and atraumatic.     Nose: Nose normal.  Eyes:     General: No scleral icterus.       Right eye: No discharge.        Left eye: No discharge.     Conjunctiva/sclera: Conjunctivae normal.  Cardiovascular:     Rate and Rhythm: Normal rate and regular rhythm.     Heart sounds: Normal heart sounds. No murmur heard. No friction rub. No gallop.   Pulmonary:     Effort:  Pulmonary effort is normal. Tachypnea present. No respiratory distress.     Breath sounds: Normal breath sounds.  Abdominal:     General: Bowel sounds are normal. There is no distension.     Palpations: Abdomen is soft.     Tenderness: There is no abdominal tenderness. There is no guarding.  Musculoskeletal:        General: Normal range of motion.     Cervical back: Normal range of motion and neck supple.     Right lower leg: No edema.     Left lower leg: No edema.  Skin:    General: Skin is warm and dry.     Findings: No rash.  Neurological:     Mental Status: She is alert.     Motor: No abnormal muscle tone.     Coordination: Coordination normal.     ED Results / Procedures / Treatments   Labs (all labs ordered are listed, but only abnormal results are displayed) Labs Reviewed  COMPREHENSIVE METABOLIC PANEL - Abnormal; Notable for the following components:      Result Value   Potassium 3.2 (*)    CO2 21 (*)    Glucose, Bld 153 (*)    Calcium 8.8 (*)    Albumin 2.9 (*)    All other components within normal limits  CBC - Abnormal; Notable for the following components:   WBC 17.3 (*)    RBC 3.84 (*)    Hemoglobin 11.8 (*)    Platelets 482 (*)    All other components within normal limits  RESP PANEL BY RT-PCR (FLU A&B, COVID) ARPGX2  CULTURE, BLOOD (ROUTINE X 2)  CULTURE, BLOOD (ROUTINE X 2)  LIPASE, BLOOD    EKG None  Radiology DG Chest Portable 1 View  Result Date: 09/16/2020 CLINICAL DATA:  Shortness of breath and fever EXAM: PORTABLE CHEST 1 VIEW COMPARISON:  January 12, 2015 FINDINGS: There is chronic elevation of the right hemidiaphragm. There is ill-defined airspace opacity throughout  portions of the right upper and lower lung regions. The left lung is clear. Heart size and pulmonary vascularity are normal. No adenopathy. There is aortic atherosclerosis. There is postoperative change in the proximal left humerus. There is a calcified breast implant on each side.  IMPRESSION: Stable elevation of the right hemidiaphragm. Areas of airspace opacity in the right upper and mid lung regions consistent with multifocal pneumonia. No appreciable consolidation. Note that atypical organism pneumonia could present in this manner. Left lung clear. Heart size normal. Aortic Atherosclerosis (ICD10-I70.0). Electronically Signed   By: Lowella Grip III M.D.   On: 09/16/2020 15:47    Procedures Procedures   Medications Ordered in ED Medications  cefTRIAXone (ROCEPHIN) 1 g in sodium chloride 0.9 % 100 mL IVPB (has no administration in time range)  doxycycline (VIBRA-TABS) tablet 100 mg (has no administration in time range)  ondansetron (ZOFRAN-ODT) disintegrating tablet 4 mg (4 mg Oral Given 09/16/20 1501)  sodium chloride 0.9 % bolus 1,000 mL (1,000 mLs Intravenous Bolus 09/16/20 1701)    ED Course  I have reviewed the triage vital signs and the nursing notes.  Pertinent labs & imaging results that were available during my care of the patient were reviewed by me and considered in my medical decision making (see chart for details).  Clinical Course as of 09/16/20 1702  Thu Sep 16, 2020  1534 WBC(!): 17.3 [HK]  1534 SARS Coronavirus 2 by RT PCR: NEGATIVE [HK]    Clinical Course User Index [HK] Kelli Heady, PA-C   MDM Rules/Calculators/A&P                          PORSCHEA BORYS was evaluated in Emergency Department on 09/16/20 for the symptoms described in the history of present illness. He/she was evaluated in the context of the global COVID-19 pandemic, which necessitated consideration that the patient might be at risk for infection with the SARS-CoV-2 virus that causes COVID-19. Institutional protocols and algorithms that pertain to the evaluation of patients at risk for COVID-19 are in a state of rapid change based on information released by regulatory bodies including the CDC and federal and state organizations. These policies and algorithms were followed  during the patient's care in the ED.  75 year old female with past medical history of IDDM, arthritis presenting to the ED with a chief complaint of shortness of breath, fever, cough, myalgias and vomiting.  Symptoms have been going on for the past week.  Had a virtual visit with her PCP a few days ago and prescribed a Z-Pak as well as albuterol.  She has been taking these medications along with Tylenol and aspirin to help with her symptoms.  Stop taking aspirin a few days ago due to decreased p.o. intake.  States that her niece had similar symptoms at the beginning of the month and was around her.  Denies any hemoptysis, diarrhea, chest pain or abdominal pain.  On exam patient appears short of breath, cough noted. No leg swelling noted bilaterally.  Abdomen is soft.  She is afebrile here with a last antipyretic use being less than 6 hours ago.  Oxygen saturations in the low 90s on room air.  She is tachypneic.  Chest x-ray shows findings consistent with multifocal pneumonia.  She does have a leukocytosis of 17.3, CMP is unremarkable.  Flu and Covid testing are negative.  Blood cultures were obtained.  EKG shows normal sinus rhythm.  I have ordered IV antibiotics as  well as IV fluids patient will require admission in the setting of failed outpatient treatment for CAP and comorbidities.  Patient is agreeable to the plan.   All imaging, if done today, including plain films, CT scans, and ultrasounds, independently reviewed by me, and interpretations confirmed via formal radiology reads.  Portions of this note were generated with [employer]. Dictation errors may occur despite best attempts at proofreading.  Final Clinical Impression(s) / ED Diagnoses Final diagnoses:  Multifocal pneumonia    Rx / DC Orders ED Discharge Orders    None       Kelli Heady, PA-C 09/16/20 1702    Lajean Saver, MD 09/16/20 1715    Lajean Saver, MD 09/16/20 [phone]

## 2020-09-16 NOTE — ED Triage Notes (Signed)
Emergency Medicine Provider Triage Evaluation Note  Kelli [name] , a 75 y.o. female  was evaluated in triage.  Pt complains of uri symptoms.  Review of Systems  Positive: loss of appetite, diaphoresis, chills, cough , nausea vomiting  Negative: CP, dysuria, syncope    Physical Exam  BP (!) 152/53 (BP Location: Left Arm)   Pulse 83   Temp 98.6 F (37 C) (Oral)   Resp (!) 21   LMP  (LMP Unknown)   SpO2 96%  Gen:   Awake, ill-appearing HEENT:  Atraumatic  Resp:  Normal effort  Cardiac:  Normal rate  Abd:   Nondistended, nontender, but actively vomiting MSK:   Moves extremities without difficulty  Neuro:  Speech clear   Medical Decision Making  Medically screening exam initiated at 2:44 PM.  Appropriate orders placed.  Kelli [name] was informed that the remainder of the evaluation will be completed by another provider, this initial triage assessment does not replace that evaluation, and the importance of remaining in the ED until their evaluation is complete.  Clinical Impression  Patient here with vomiting, diarrhea, loss of appetite, cough, nasal congestion, chills.  Basic labs ordered, chest x-ray, Covid test, patient given Zofran still vomiting.  Nursing staff noted to explain patient's rooming process.   Garald Balding, PA-C 09/16/20 1530

## 2020-09-16 NOTE — ED Notes (Signed)
Floor called and stated they were not ready for the pt to arrive to the floor

## 2020-09-16 NOTE — Telephone Encounter (Signed)
Noted, hope she's ok!

## 2020-09-16 NOTE — H&P (Signed)
History and Physical:    Kelli [name]   ZRA:[id number] DOB: 11/05/1945 DOA: 09/16/2020  Referring MD/provider: PA Shelly Coss PCP: Emeterio Reeve, DO   Patient coming from: Home  Chief Complaint: Increasing shortness of breath, weakness and ongoing cough  History of Present Illness:   Kelli [name] is an 75 y.o. female with PMH significant for HTN, DM 2, lung cancer status post lobectomy several years ago, 70-pack-year tobacco history who was in her usual state of reasonably good health until 7 to 10 days ago when she had onset of malaise after being exposed to her niece who was also feeling unwell.  Patient states the malaise progressed to initially nonproductive cough, fatigue and some dyspnea on exertion.  Patient called her PCP 4 days ago and had a telemedicine visit.  At that time she was started on azithromycin and albuterol.  Patient notes that her cough became productive and "hacking".  She also developed some nausea and diarrhea since she had started the azithromycin.  No blood in vomitus or stool.  Patient presented to the ED today because she was coughing more, having a hard time catching her breath and was feeling generally weak.  At baseline, patient states she is active, is able to walk without limitation, does her own shopping.  Notes that she is generally not that short of breath.  ED Course:  The patient was noted to be afebrile with normal vital signs.  She was satting at 92% on room air.  Chest x-ray revealed multifocal pneumonia and she was started on ceftriaxone and doxycycline for CAP.  ROS:   ROS   Review of Systems:  Eyes: Denies recent change in vision, no discharge, redness, pain noted Endocrine: Denies heat/cold intolerance, polyuria or weight loss. Cardiovascular: Denies chest pain or palpitations GI: Denies nausea, vomiting, diarrhea or constipation GU: Denies dysuria, frequency or hematuria CNS: Denies HA, dizziness, confusion, new weakness or  clumsiness. Blood/lymphatics: Denies easy bruising or bleeding Mood/affect: Denies anxiety/depression    Past Medical History:   Past Medical History:  Diagnosis Date  . Anxiety   . Arthritis   . Borderline diabetic   . Colon cancer ([place])    colon ca dx 02/2009, lung cancer  . Depression   . Depression with anxiety 09/19/2011  . H/O colon cancer, stage II 09/19/2011   T3N0  [place] Sept 2010  . High cholesterol   . Hyperlipidemia 09/19/2011  . Lung nodules 09/19/2011   granulomas  . Urinary urgency     Past Surgical History:   Past Surgical History:  Procedure Laterality Date  . APPENDECTOMY    . AUGMENTATION MAMMAPLASTY    . BREAST SURGERY  1978   augmentation  . COLON SURGERY     2010  . CRYO INTERCOSTAL NERVE BLOCK N/A 10/05/2014   Procedure: CRYO INTERCOSTAL NERVE BLOCK;  Surgeon: [name], MD;  Location: [place];  Service: Thoracic;  Laterality: N/A;  . LYMPH NODE DISSECTION Right 10/05/2014   Procedure: LYMPH NODE DISSECTION;  Surgeon: [name], MD;  Location: [place];  Service: Thoracic;  Laterality: Right;  . ORIF HUMERUS FRACTURE Left 10/01/2018   Procedure: OPEN REDUCTION INTERNAL FIXATION (ORIF) LEFT PROXIMAL HUMERUS FRACTURE;  Surgeon: [name], MD;  Location: [place];  Service: Orthopedics;  Laterality: Left;  . PARTIAL COLECTOMY Right 2010  . SEGMENTECOMY Right 10/05/2014   Procedure: right lower lobe SEGMENTECTOMY;  Surgeon: [name], MD;  Location: [place];  Service: Thoracic;  Laterality: Right;  [name] VIDEO ASSISTED THORACOSCOPY (VATS)/WEDGE RESECTION Right 10/05/2014   Procedure: Right VIDEO ASSISTED THORACOSCOPY (VATS) with right lower lobe lung nodule WEDGE RESECTION;  Surgeon: [name], MD;  Location: [place];  Service: Thoracic;  Laterality: Right;    Social History:   Social History   Socioeconomic History  . Marital status: Divorced    Spouse name: Not on file  . Number of children: Not on file  . Years of  education: Not on file  . Highest education level: Not on file  Occupational History  . Not on file  Tobacco Use  . Smoking status: Former Smoker    Packs/day: 3.00    Years: 40.00    Pack years: 120.00    Types: Cigarettes    Quit date: 02/06/2009    Years since quitting: 11.6  . Smokeless tobacco: Never Used  Vaping Use  . Vaping Use: Never used  Substance and Sexual Activity  . Alcohol use: Yes    Alcohol/week: 0.0 standard drinks    Comment: 3-4 times a week 1-2 beers  . Drug use: No  . Sexual activity: Never    Birth control/protection: Post-menopausal  Other Topics Concern  . Not on file  Social History Narrative  . Not on file   Social Determinants of Health   Financial Resource Strain: Not on file  Food Insecurity: Not on file  Transportation Needs: Not on file  Physical Activity: Not on file  Stress: Not on file  Social Connections: Not on file  Intimate Partner Violence: Not on file    Allergies   Atorvastatin and Glipizide er  Family history:   Family History  Problem Relation Age of Onset  . Heart disease Mother   . Colon polyps Sister   . Heart disease Brother   . Colon cancer Neg Hx   . Esophageal cancer Neg Hx   . Stomach cancer Neg Hx   . Rectal cancer Neg Hx     Current Medications:   Prior to Admission medications   Medication Sig Start Date End Date Taking? Authorizing Provider  albuterol (VENTOLIN HFA) 108 (90 Base) MCG/ACT inhaler Inhale 2 puffs into the lungs every 6 (six) hours as needed for wheezing or shortness of breath. 09/13/20   Emeterio Reeve, DO  AMBULATORY NON FORMULARY MEDICATION Single glucometer with lancets, test strips. Test daily.  Disp qs 3 months E11.9 07/10/18   Gregor Hams, MD  azithromycin (ZITHROMAX) 250 MG tablet 2 tabs po x1 on Day 1, then 1 tab po daily on Days 2 - 5 09/13/20   Emeterio Reeve, DO  B Complex-C (B-COMPLEX WITH VITAMIN C) tablet Take 1 tablet by mouth daily.    [provider]   B-D UF III MINI PEN NEEDLES 31G X 5 MM MISC USE AS DIRECTED 01/22/20   Emeterio Reeve, DO  Cholecalciferol (VITAMIN D) 2000 UNITS tablet Take 2,000 Units by mouth daily.    [provider]  cloNIDine (CATAPRES) 0.1 MG tablet Take 1 tablet (0.1 mg total) by mouth at bedtime. 09/16/19   Emeterio Reeve, DO  FLUoxetine (PROZAC) 40 MG capsule Take 1 capsule (40 mg total) by mouth daily. 09/16/19   Emeterio Reeve, DO  gabapentin (NEURONTIN) 300 MG capsule TAKE 1 CAPSULE (300 MG TOTAL) BY MOUTH AT BEDTIME AS NEEDED (INSOMNIA). 02/18/20   Emeterio Reeve, DO  Insulin Glargine (BASAGLAR KWIKPEN) 100 UNIT/ML 10 UNITS AT BED INCREASE BY 2 UNITS EVERY 3 DAYS TIL FASTING SUGAR  GOAL 120-150 MAX 60 UNITS PER DAY 05/04/20   Emeterio Reeve, DO  OneTouch Delica Lancets 42V MISC TEST BLOOD SUGARS DAILY 08/05/20   Emeterio Reeve, DO  [facility] VERIO test strip TEST BLOOD SUGARS DAILY 07/11/19   Emeterio Reeve, DO  pravastatin (PRAVACHOL) 80 MG tablet Take 1 tablet (80 mg total) by mouth daily. 09/16/19   Emeterio Reeve, DO    Physical Exam:   Vitals:   09/16/20 1332 09/16/20 1533 09/16/20 1600 09/16/20 1630  BP: (!) 152/53 (!) 159/64 (!) 142/68 (!) 143/59  Pulse: 83 85 86 81  Resp: (!) 21 18 15  (!) 21  Temp: 98.6 F (37 C)     TempSrc: Oral     SpO2: 96% 93% 94% 93%     Physical Exam: Blood pressure (!) 143/59, pulse 81, temperature 98.6 F (37 C), temperature source Oral, resp. rate (!) 21, SpO2 93 %. Gen: Tired appearing female lying in bed with O2 sats of 89% on room air speaking in full sentences with mild shortness of breath. Eyes: sclera anicteric, conjuctiva mildly injected bilaterally CVS: S1-S2, regulary, no gallops Respiratory: Decreased air entry however has coarse crackles in mid and lower right lung field, no wheezes. GI: NABS, soft, NT  LE: Trace edema bilaterally Neuro: A/O x 3, Moving all extremities equally with normal strength, CN 3-12 intact, grossly  nonfocal.  Psych: patient is logical and coherent, judgement and insight appear normal, mood and affect appropriate to situation. Skin: no rashes or lesions or ulcers,    Data Review:    Labs: Basic Metabolic Panel: Recent Labs  Lab 09/16/20 1405  NA 138  K 3.2*  CL 102  CO2 21*  GLUCOSE 153*  BUN 12  CREATININE 0.78  CALCIUM 8.8*   Liver Function Tests: Recent Labs  Lab 09/16/20 1405  AST 21  ALT 28  ALKPHOS 114  BILITOT 1.0  PROT 7.3  ALBUMIN 2.9*   Recent Labs  Lab 09/16/20 1405  LIPASE 22   No results for input(s): AMMONIA in the last 168 hours. CBC: Recent Labs  Lab 09/16/20 1405  WBC 17.3*  HGB 11.8*  HCT 36.0  MCV 93.8  PLT 482*   Cardiac Enzymes: No results for input(s): CKTOTAL, CKMB, CKMBINDEX, TROPONINI in the last 168 hours.  BNP (last 3 results) No results for input(s): PROBNP in the last 8760 hours. CBG: No results for input(s): GLUCAP in the last 168 hours.  Urinalysis    Component Value Date/Time   COLORURINE DARK YELLOW 11/29/2017 1405   APPEARANCEUR CLEAR 11/29/2017 1405   LABSPEC 1.028 11/29/2017 1405   PHURINE < OR = 5.0 11/29/2017 1405   GLUCOSEU NEGATIVE 11/29/2017 1405   HGBUR NEGATIVE 11/29/2017 1405   BILIRUBINUR NEGATIVE 10/01/2014 1240   KETONESUR NEGATIVE 11/29/2017 1405   PROTEINUR NEGATIVE 11/29/2017 1405   UROBILINOGEN 0.2 10/01/2014 1240   NITRITE NEGATIVE 11/29/2017 1405   LEUKOCYTESUR NEGATIVE 11/29/2017 1405      Radiographic Studies: DG Chest Portable 1 View  Result Date: 09/16/2020 CLINICAL DATA:  Shortness of breath and fever EXAM: PORTABLE CHEST 1 VIEW COMPARISON:  January 12, 2015 FINDINGS: There is chronic elevation of the right hemidiaphragm. There is ill-defined airspace opacity throughout portions of the right upper and lower lung regions. The left lung is clear. Heart size and pulmonary vascularity are normal. No adenopathy. There is aortic atherosclerosis. There is postoperative change in the  proximal left humerus. There is a calcified breast implant on each side. IMPRESSION: Stable elevation  of the right hemidiaphragm. Areas of airspace opacity in the right upper and mid lung regions consistent with multifocal pneumonia. No appreciable consolidation. Note that atypical organism pneumonia could present in this manner. Left lung clear. Heart size normal. Aortic Atherosclerosis (ICD10-I70.0). Electronically Signed   By: Lowella Grip III M.D.   On: 09/16/2020 15:47    EKG: Independently reviewed.  Sinus rhythm at 90.  Normal intervals.  Normal axis.  Isolated Q in 3.  No acute ST-T wave changes.  Essentially normal EKG.   Assessment/Plan:   Active Problems:   Multifocal pneumonia  75 year old female status post lobectomy secondary to lung CA several years ago was admitted with multifocal community-acquired pneumonia.  Multifocal community-acquired pneumonia We will treat with ceftriaxone and azithromycin Patient with no clear diagnosis of COPD however does have a 70-pack-year tobacco history so will prescribe duo nebs every 6 hours as needed as well as flutter valve especially given history of lobectomy.  Hypokalemia We will replete and recheck  DM2 Decrease glargine from 35 units to 20 units SSI AC at bedtime moderate dose  HTN Continue home doses of clonidine  Anxiety and depression Continue Prozac per home doses   Other information:   DVT prophylaxis: Enoxaparin ordered. Code Status: Full Family Communication: None Disposition Plan: Home Consults called: None Admission status: Inpatient  [facility]  If 7PM-7AM, please contact night-coverage [email or web address] Password [facility] 09/16/2020, 5:58 PM

## 2020-09-16 NOTE — Telephone Encounter (Signed)
Pt called stating she wanted provider to be aware that she was going to [facility] ER in [place] on [address]. She mentioned she was not any better today.

## 2020-09-17 ENCOUNTER — Other Ambulatory Visit: Payer: Self-pay

## 2020-09-17 ENCOUNTER — Encounter (HOSPITAL_COMMUNITY): Payer: Self-pay | Admitting: Internal Medicine

## 2020-09-17 DIAGNOSIS — A419 Sepsis, unspecified organism: Secondary | ICD-10-CM

## 2020-09-17 DIAGNOSIS — E119 Type 2 diabetes mellitus without complications: Secondary | ICD-10-CM

## 2020-09-17 DIAGNOSIS — E785 Hyperlipidemia, unspecified: Secondary | ICD-10-CM

## 2020-09-17 LAB — COMPREHENSIVE METABOLIC PANEL
ALT: 24 U/L (ref 0–44)
AST: 27 U/L (ref 15–41)
Albumin: 2.3 g/dL — ABNORMAL LOW (ref 3.5–5.0)
Alkaline Phosphatase: 106 U/L (ref 38–126)
Anion gap: 8 (ref 5–15)
BUN: 13 mg/dL (ref 8–23)
CO2: 21 mmol/L — ABNORMAL LOW (ref 22–32)
Calcium: 8.3 mg/dL — ABNORMAL LOW (ref 8.9–10.3)
Chloride: 108 mmol/L (ref 98–111)
Creatinine, Ser: 0.75 mg/dL (ref 0.44–1.00)
GFR, Estimated: >60 mL/min (ref >60)
Glucose, Bld: 158 mg/dL — ABNORMAL HIGH (ref 70–99)
Potassium: 4 mmol/L (ref 3.5–5.1)
Sodium: 137 mmol/L (ref 135–145)
Total Bilirubin: 0.8 mg/dL (ref 0.3–1.2)
Total Protein: 6 g/dL — ABNORMAL LOW (ref 6.5–8.1)

## 2020-09-17 LAB — CBC
HCT: 29.3 % — ABNORMAL LOW (ref 36.0–46.0)
HCT: 30.7 % — ABNORMAL LOW (ref 36.0–46.0)
Hemoglobin: 9.7 g/dL — ABNORMAL LOW (ref 12.0–15.0)
Hemoglobin: 10.2 g/dL — ABNORMAL LOW (ref 12.0–15.0)
MCH: 31 pg (ref 26.0–34.0)
MCH: 31.2 pg (ref 26.0–34.0)
MCHC: 33.1 g/dL (ref 30.0–36.0)
MCHC: 33.2 g/dL (ref 30.0–36.0)
MCV: 93.6 fL (ref 80.0–100.0)
MCV: 93.9 fL (ref 80.0–100.0)
Platelets: 385 10*3/uL (ref 150–400)
Platelets: 384 10*3/uL (ref 150–400)
RBC: 3.13 MIL/uL — ABNORMAL LOW (ref 3.87–5.11)
RBC: 3.27 MIL/uL — ABNORMAL LOW (ref 3.87–5.11)
RDW: 13.8 % (ref 11.5–15.5)
RDW: 13.6 % (ref 11.5–15.5)
WBC: 14.3 10*3/uL — ABNORMAL HIGH (ref 4.0–10.5)
WBC: 12.6 10*3/uL — ABNORMAL HIGH (ref 4.0–10.5)
nRBC: 0 % (ref 0.0–0.2)
nRBC: 0 % (ref 0.0–0.2)

## 2020-09-17 LAB — BASIC METABOLIC PANEL
Anion gap: 6 (ref 5–15)
BUN: 11 mg/dL (ref 8–23)
CO2: 24 mmol/L (ref 22–32)
Calcium: 8.1 mg/dL — ABNORMAL LOW (ref 8.9–10.3)
Chloride: 106 mmol/L (ref 98–111)
Creatinine, Ser: 0.71 mg/dL (ref 0.44–1.00)
GFR, Estimated: >60 mL/min (ref >60)
Glucose, Bld: 147 mg/dL — ABNORMAL HIGH (ref 70–99)
Potassium: 3.8 mmol/L (ref 3.5–5.1)
Sodium: 136 mmol/L (ref 135–145)

## 2020-09-17 LAB — GLUCOSE, CAPILLARY
Glucose-Capillary: 130 mg/dL — ABNORMAL HIGH (ref 70–99)
Glucose-Capillary: 161 mg/dL — ABNORMAL HIGH (ref 70–99)
Glucose-Capillary: 165 mg/dL — ABNORMAL HIGH (ref 70–99)
Glucose-Capillary: 174 mg/dL — ABNORMAL HIGH (ref 70–99)
Glucose-Capillary: 278 mg/dL — ABNORMAL HIGH (ref 70–99)

## 2020-09-17 LAB — LACTIC ACID, PLASMA
Lactic Acid, Venous: 0.9 mmol/L (ref 0.5–1.9)
Lactic Acid, Venous: 1.1 mmol/L (ref 0.5–1.9)

## 2020-09-17 LAB — PROCALCITONIN: Procalcitonin: 0.17 ng/mL

## 2020-09-17 LAB — PHOSPHORUS: Phosphorus: 2.2 mg/dL — ABNORMAL LOW (ref 2.5–4.6)

## 2020-09-17 LAB — BRAIN NATRIURETIC PEPTIDE: B Natriuretic Peptide: 166.7 pg/mL — ABNORMAL HIGH (ref 0.0–100.0)

## 2020-09-17 LAB — MAGNESIUM: Magnesium: 1.9 mg/dL (ref 1.7–2.4)

## 2020-09-17 MED ORDER — LORAZEPAM 1 MG PO TABS: 1.0000 mg | ORAL_TABLET | ORAL | Status: AC | PRN

## 2020-09-17 MED ORDER — SENNOSIDES-DOCUSATE SODIUM 8.6-50 MG PO TABS: 1.0000 | ORAL_TABLET | Freq: Every evening | ORAL | Status: DC | PRN

## 2020-09-17 MED ORDER — THIAMINE HCL 100 MG/ML IJ SOLN: 100.0000 mg | Freq: Every day | INTRAMUSCULAR | Status: DC

## 2020-09-17 MED ORDER — MENTHOL 3 MG MT LOZG
1.0000 | LOZENGE | OROMUCOSAL | Status: DC | PRN
  Administered 2020-09-18 – 2020-09-19 (×2): 3 mg via ORAL
  Filled 2020-09-17: qty 9

## 2020-09-17 MED ORDER — LORAZEPAM 2 MG/ML IJ SOLN: 1.0000 mg | INTRAMUSCULAR | Status: AC | PRN

## 2020-09-17 MED ORDER — INSULIN ASPART 100 UNIT/ML ~~LOC~~ SOLN
0.0000 [IU] | Freq: Three times a day (TID) | SUBCUTANEOUS | Status: DC
  Administered 2020-09-17 (×2): 3 [IU] via SUBCUTANEOUS
  Administered 2020-09-18: 5 [IU] via SUBCUTANEOUS
  Administered 2020-09-18: 3 [IU] via SUBCUTANEOUS
  Administered 2020-09-18 – 2020-09-19 (×3): 2 [IU] via SUBCUTANEOUS
  Administered 2020-09-19 – 2020-09-21 (×3): 3 [IU] via SUBCUTANEOUS

## 2020-09-17 MED ORDER — IPRATROPIUM-ALBUTEROL 0.5-2.5 (3) MG/3ML IN SOLN: 3.0000 mL | RESPIRATORY_TRACT | Status: DC | PRN

## 2020-09-17 MED ORDER — IPRATROPIUM-ALBUTEROL 0.5-2.5 (3) MG/3ML IN SOLN
3.0000 mL | Freq: Three times a day (TID) | RESPIRATORY_TRACT | Status: DC
  Administered 2020-09-17 (×3): 3 mL via RESPIRATORY_TRACT
  Filled 2020-09-17 (×3): qty 3

## 2020-09-17 MED ORDER — FOLIC ACID 1 MG PO TABS
1.0000 mg | ORAL_TABLET | Freq: Every day | ORAL | Status: DC
  Administered 2020-09-17 – 2020-09-21 (×5): 1 mg via ORAL
  Filled 2020-09-17 (×5): qty 1

## 2020-09-17 MED ORDER — ACETAMINOPHEN 325 MG PO TABS
650.0000 mg | ORAL_TABLET | Freq: Four times a day (QID) | ORAL | Status: DC | PRN
  Administered 2020-09-17 – 2020-09-21 (×10): 650 mg via ORAL
  Filled 2020-09-17 (×11): qty 2

## 2020-09-17 MED ORDER — THIAMINE HCL 100 MG PO TABS
100.0000 mg | ORAL_TABLET | Freq: Every day | ORAL | Status: DC
  Administered 2020-09-17 – 2020-09-21 (×5): 100 mg via ORAL
  Filled 2020-09-17 (×5): qty 1

## 2020-09-17 MED ORDER — INSULIN ASPART 100 UNIT/ML ~~LOC~~ SOLN
0.0000 [IU] | Freq: Every day | SUBCUTANEOUS | Status: DC
  Administered 2020-09-17: 3 [IU] via SUBCUTANEOUS
  Administered 2020-09-20: 2 [IU] via SUBCUTANEOUS

## 2020-09-17 MED ORDER — GUAIFENESIN-DM 100-10 MG/5ML PO SYRP
5.0000 mL | ORAL_SOLUTION | ORAL | Status: DC | PRN
  Administered 2020-09-17 – 2020-09-18 (×2): 5 mL via ORAL
  Filled 2020-09-17 (×2): qty 5

## 2020-09-17 MED ORDER — ADULT MULTIVITAMIN W/MINERALS CH
1.0000 | ORAL_TABLET | Freq: Every day | ORAL | Status: DC
  Administered 2020-09-17 – 2020-09-21 (×5): 1 via ORAL
  Filled 2020-09-17 (×5): qty 1

## 2020-09-17 MED ORDER — BUDESONIDE 0.5 MG/2ML IN SUSP
0.5000 mg | Freq: Two times a day (BID) | RESPIRATORY_TRACT | Status: DC
  Administered 2020-09-17 – 2020-09-21 (×8): 0.5 mg via RESPIRATORY_TRACT
  Filled 2020-09-17 (×8): qty 2

## 2020-09-17 MED ORDER — DM-GUAIFENESIN ER 30-600 MG PO TB12: 1.0000 | ORAL_TABLET | Freq: Two times a day (BID) | ORAL | Status: DC | PRN

## 2020-09-17 NOTE — Progress Notes (Signed)
SATURATION QUALIFICATIONS: (This note is used to comply with regulatory documentation for home oxygen)  Patient Saturations on Room Air at Rest = 92%  Patient Saturations on Room Air with standing pericare = 84%  Patient Saturations on 2  Liters of oxygen while Ambulating = 93%  Please briefly explain why patient needs home oxygen:Pt desats with activity.   Needs O2 with activity.  Emojean Gertz M,PT Acute Rehab Services [phone] (pager)

## 2020-09-17 NOTE — Evaluation (Signed)
Physical Therapy Evaluation Patient Details Name: Kelli [name] MRN: [id number] DOB: 10/15/1945 Today's Date: 09/17/2020   History of Present Illness  75 year old female admitted for sepsis due to multifocal pneumonia.  PMHx: HTN, DM2, VATS and wedge resection RLL due to stage 1b NSCLC, tobacco use  Clinical Impression  Pt admitted with above diagnosis.  Pt currently with functional limitations due to the deficits listed below (see PT Problem List). Pt will benefit from skilled PT to increase their independence and safety with mobility to allow discharge to the venue listed below.  Pt assisted with ambulating in hallway and requiring 2L O2 [place] for ambulating at this time.  Pt reports overall generalized weakness however anticipates to return home alone upon d/c.  Pt uncertain of HHPT at this time as she is hopeful she will d/c home with no needs.  Pt encouraged to be OOB and in recliner and ambulate with staff as able during hospitalization.     Follow Up Recommendations Home health PT    Equipment Recommendations  None recommended by PT    Recommendations for Other Services       Precautions / Restrictions Precautions Precautions: Fall Precaution Comments: monitor sats      Mobility  Bed Mobility Overal bed mobility: Needs Assistance Bed Mobility: Supine to Sit     Supine to sit: Min assist     General bed mobility comments: light assist for trunk upright    Transfers Overall transfer level: Needs assistance Equipment used: None Transfers: Sit to/from Stand Sit to Stand: Min guard         General transfer comment: verbal cues for hand placement, min/guard for safety; pt reports generalized weakness; pt stood at EOB for pericare and fatigued quickly so returned to sitting for rest break prior to ambulation; SPO2 also dropped to 84% on room air during this task so reapplied 2L O2 [place] for ambulating  Ambulation/Gait Ambulation/Gait assistance: Min guard;+2  safety/equipment (utilized +2 for tank and equipment) Gait Distance (Feet): 100 Feet Assistive device: None Gait Pattern/deviations: Step-through pattern;Decreased stride length     General Gait Details: slow gait, occasional unsteadiness however no true LOB or assist required; pt reports generalized weakness; 50'x2 for brief standing rest break and breathing; SPO2 93-95% on 2L O2 [place]  Stairs            Wheelchair Mobility    Modified Rankin (Stroke Patients Only)       Balance Overall balance assessment: Needs assistance         Standing balance support: No upper extremity supported Standing balance-Leahy Scale: Good Standing balance comment: able to perform pericare standing beside bed without difficulty                             Pertinent Vitals/Pain Pain Assessment: No/denies pain  Pre-session vitals: 120/49 mmHg, 79 bpm    Home Living Family/patient expects to be discharged to:: Private residence Living Arrangements: Alone Available Help at Discharge: Family;Friend(s);Available PRN/intermittently Type of Home: House Home Access: Stairs to enter Entrance Stairs-Rails: None Entrance Stairs-Number of Steps: 1-2 Home Layout: One level Home Equipment: Cane - single point      Prior Function Level of Independence: Independent               Hand Dominance        Extremity/Trunk Assessment        Lower Extremity Assessment Lower Extremity Assessment: Generalized weakness  Cervical / Trunk Assessment Cervical / Trunk Assessment: Normal  Communication   Communication: No difficulties  Cognition Arousal/Alertness: Awake/alert Behavior During Therapy: WFL for tasks assessed/performed Overall Cognitive Status: Within Functional Limits for tasks assessed                                        General Comments      Exercises     Assessment/Plan    PT Assessment Patient needs continued PT services  PT  Problem List Decreased strength;Decreased mobility;Decreased activity tolerance;Cardiopulmonary status limiting activity       PT Treatment Interventions DME instruction;Gait training;Therapeutic exercise;Balance training;Functional mobility training;Therapeutic activities;Patient/family education;Stair training    PT Goals (Current goals can be found in the Care Plan section)  Acute Rehab PT Goals Patient Stated Goal: feel better, go home with no needs PT Goal Formulation: With patient Time For Goal Achievement: 10/01/20 Potential to Achieve Goals: Good    Frequency Min 3X/week   Barriers to discharge        Co-evaluation               AM-PAC PT "6 Clicks" Mobility  Outcome Measure Help needed turning from your back to your side while in a flat bed without using bedrails?: A Little Help needed moving from lying on your back to sitting on the side of a flat bed without using bedrails?: A Little Help needed moving to and from a bed to a chair (including a wheelchair)?: A Little Help needed standing up from a chair using your arms (e.g., wheelchair or bedside chair)?: A Little Help needed to walk in hospital room?: A Little Help needed climbing 3-5 steps with a railing? : A Lot 6 Click Score: 17    End of Session Equipment Utilized During Treatment: Gait belt Activity Tolerance: Patient tolerated treatment well Patient left: in chair;with call bell/phone within reach;with chair alarm set Nurse Communication: Mobility status PT Visit Diagnosis: Difficulty in walking, not elsewhere classified (R26.2);Muscle weakness (generalized) (M62.81)    Time: 7829-5621 PT Time Calculation (min) (ACUTE ONLY): 23 min   Charges:   PT Evaluation $PT Eval Moderate Complexity: 1 Mod        Kati PT, DPT Acute Rehabilitation Services Pager: [phone] Office: [phone]  Randale Carvalho,KATHrine E 09/17/2020, 10:58 AM

## 2020-09-17 NOTE — Evaluation (Signed)
Occupational Therapy Evaluation Patient Details Name: Kelli [name] MRN: [id number] DOB: 06-04-1946 Today's Date: 09/17/2020    History of Present Illness 75 year old female admitted for sepsis due to multifocal pneumonia.  PMHx: HTN, DM2, VATS and wedge resection RLL due to stage 1b NSCLC, tobacco use   Clinical Impression   Patient admitted with the above diagnosis.  PTA she was independent with mobility and all care.  She does have neighbors and family that check in on her, but she generally needs no assist.  Of note, she is complaining of low back, possible strain, after a coughing spell.  OT provided ice pack and practiced log rolling, and emphasized moving every few hours to prevent stiffness.  RN was notified.  Barriers are listed below.  Currently she is needing up to [place] for basic mobility and lower body ADL due to back pain and decreased activity tolerance.  OT will follow her in the acute setting to maximize her functional status for an eventual return home.  OT is not anticipating any OT needs post acute.      Follow Up Recommendations  No OT follow up    Equipment Recommendations  None recommended by OT    Recommendations for Other Services       Precautions / Restrictions Precautions Precautions: Fall Precaution Comments: monitor sats Restrictions Weight Bearing Restrictions: No      Mobility Bed Mobility Overal bed mobility: Needs Assistance Bed Mobility: Rolling;Sidelying to Sit;Sit to Sidelying Rolling: Supervision Sidelying to sit: Min assist Supine to sit: Min assist   Sit to sidelying: Min assist General bed mobility comments: patient with increased complaints of back pain after a coughing spell    Transfers Overall transfer level: Needs assistance Equipment used: None Transfers: Sit to/from Stand Sit to Stand: Min guard         General transfer comment: patient deferred mobility due to back pain.    Balance Overall balance assessment: Needs  assistance         Standing balance support: No upper extremity supported Standing balance-Leahy Scale: Good Standing balance comment: able to perform pericare standing beside bed without difficulty                           ADL either performed or assessed with clinical judgement   ADL Overall ADL's : Needs assistance/impaired Eating/Feeding: Independent   Grooming: Wash/dry hands;Wash/dry face;Supervision/safety;Standing           Upper Body Dressing : Set up;Sitting   Lower Body Dressing: Supervision/safety;Set up;Sit to/from stand Lower Body Dressing Details (indicate cue type and reason): increased time and rest breaks.             Functional mobility during ADLs: Supervision/safety;Rolling walker       Vision Baseline Vision/History: Wears glasses Wears Glasses: At all times Patient Visual Report: No change from baseline       Perception     Praxis      Pertinent Vitals/Pain Pain Assessment: Faces Faces Pain Scale: Hurts little more Pain Location: low back, ? sprain/strain after coughing Pain Descriptors / Indicators: Stabbing;Tender;Grimacing Pain Intervention(s): Monitored during session     Hand Dominance Right   Extremity/Trunk Assessment Upper Extremity Assessment Upper Extremity Assessment: Generalized weakness   Lower Extremity Assessment Lower Extremity Assessment: Defer to PT evaluation   Cervical / Trunk Assessment Cervical / Trunk Assessment: Normal   Communication Communication Communication: No difficulties   Cognition Arousal/Alertness: Awake/alert Behavior During  Therapy: WFL for tasks assessed/performed Overall Cognitive Status: Within Functional Limits for tasks assessed                                                      Home Living Family/patient expects to be discharged to:: Private residence Living Arrangements: Alone Available Help at Discharge: Family;Friend(s);Available  PRN/intermittently Type of Home: House Home Access: Stairs to enter   Entrance Stairs-Rails: None Home Layout: One level     Bathroom Shower/Tub: Teacher, early years/pre: Standard Bathroom Accessibility: Yes How Accessible: Accessible via walker Home Equipment: Cane - single point          Prior Functioning/Environment Level of Independence: Independent        Comments: Continues to drive, and needs no assist with meals and light home management.  Looking to hire maid service for heavy home management.        OT Problem List: Decreased activity tolerance;Impaired balance (sitting and/or standing);Pain      OT Treatment/Interventions: Self-care/ADL training;Therapeutic activities;Energy conservation;DME and/or AE instruction;Balance training    OT Goals(Current goals can be found in the care plan section) Acute Rehab OT Goals Patient Stated Goal: Just get back to my house. OT Goal Formulation: With patient Time For Goal Achievement: 10/01/20 Potential to Achieve Goals: Good ADL Goals Pt Will Perform Grooming: Independently;standing;sitting Pt Will Perform Lower Body Bathing: Independently;sit to/from stand Pt Will Perform Lower Body Dressing: Independently;sit to/from stand Pt Will Transfer to Toilet: Independently;ambulating;regular height toilet  OT Frequency: Min 2X/week   Barriers to D/C:    none noted       Co-evaluation              AM-PAC OT "6 Clicks" Daily Activity     Outcome Measure Help from another person eating meals?: None Help from another person taking care of personal grooming?: None Help from another person toileting, which includes using toliet, bedpan, or urinal?: None Help from another person bathing (including washing, rinsing, drying)?: A Little Help from another person to put on and taking off regular upper body clothing?: None Help from another person to put on and taking off regular lower body clothing?: A Little 6  Click Score: 22   End of Session Equipment Utilized During Treatment: Rolling walker Nurse Communication: Other (comment) (possible low back strain)  Activity Tolerance: Patient limited by pain Patient left: in bed;with call bell/phone within reach  OT Visit Diagnosis: Pain;Muscle weakness (generalized) (M62.81)                Time: 6333-5456 OT Time Calculation (min): 22 min Charges:  OT General Charges $OT Visit: 1 Visit OT Evaluation $OT Eval Moderate Complexity: 1 Mod  09/17/2020  Rich, OTR/L  Acute Rehabilitation Services  Office:  [phone]   Metta Clines 09/17/2020, 2:34 PM

## 2020-09-17 NOTE — Progress Notes (Signed)
Initial Nutrition Assessment  DOCUMENTATION CODES:   Not applicable  INTERVENTION:   -Glucerna Shake po TID, each supplement provides 220 kcal and 10 grams of protein -MVI with minerals daily  NUTRITION DIAGNOSIS:   Increased nutrient needs related to chronic illness (COPD) as evidenced by estimated needs.  GOAL:   Patient will meet greater than or equal to 90% of their needs  MONITOR:   PO intake,Supplement acceptance,Labs,Weight trends,Skin,I & O's  REASON FOR ASSESSMENT:   Malnutrition Screening Tool    ASSESSMENT:   75 year old female status post lobectomy secondary to lung CA several years ago was admitted with multifocal community-acquired pneumonia.  Pt admitted with CAP.   Reviewed I/O's: +1 L x 24 hours  UOP: 300 ml x 24 hours  Pt unavailable at time of visit. Attempted to speak with pt via call to hospital room phone, however, unable to reach.   Pt with fair appetite. Noted meal completion 75%.   Reviewed wt hx; pt has experienced a 2.3% wt loss over the past 3 months, which is not significant for time frame.   Medications reviewed and include 0.9% sodium chloride infusion @ 100 ml/hr.   Lab Results  Component Value Date   HGBA1C 8.2 (H) 09/16/2020   PTA DM medications are 35 units insulin glargine daily.   Labs reviewed: CBGS: 130-166 (inpatient orders for glycemic control are 0-15 units insulin aspart TID with meals, 0-5 units insulin aspart daily at bedtime, and 20 units insulin glargine daily at bedtime).   Diet Order:   Diet Order            Diet heart healthy/carb modified Room service appropriate? Yes; Fluid consistency: Thin  Diet effective now                 EDUCATION NEEDS:   No education needs have been identified at this time  Skin:  Skin Assessment: Reviewed RN Assessment  Last BM:  09/16/20  Height:   Ht Readings from Last 1 Encounters:  09/16/20 5\' 8"  (1.727 m)    Weight:   Wt Readings from Last 1 Encounters:   09/17/20 80.7 kg    Ideal Body Weight:  63.6 kg  BMI:  Body mass index is 27.06 kg/m.  Estimated Nutritional Needs:   Kcal:  1900-2100  Protein:  95-110 grams  Fluid:  > 1.9 L    Loistine Chance, RD, LDN, [place] Registered Dietitian II Certified Diabetes Care and Education Specialist Please refer to [facility] for RD and/or RD on-call/weekend/after hours pager

## 2020-09-17 NOTE — Sepsis Progress Note (Signed)
Code Sepsis protocol being monitored by elink.  Blood cx at 1700 yesterday and antibiotics were started 1705 . Fluid resuscitation being held for now- See MD note. Lactic acid was just sent. Will continue to monitor

## 2020-09-17 NOTE — Progress Notes (Signed)
PROGRESS NOTE    BRANDEY VANDALEN  TDD:[id number] DOB: Jul 16, 1945 DOA: 09/16/2020 PCP: Emeterio Reeve, DO   Brief Narrative:  75 year old with history of HTN, DM2, lung cancer status post lobectomy, history of tobacco use admitted for generalized progressive weakness with malaise nonproductive cough.  As outpatient she was started on azithromycin and albuterol but due to progressive symptoms came to the hospital.  Chest x-ray showed evidence of multifocal pneumonia and started on Rocephin and doxycycline.   Assessment & Plan:   Principal Problem:   Sepsis ([place]) Active Problems:   Hyperlipidemia   Lung cancer, lower lobe (HCC)   Diabetes mellitus type 2, uncomplicated ([place])   Multifocal pneumonia    Sepsis due to multifocal pneumonia Extensive tobacco use in the past -Sepsis evidenced by fever, leukocytosis, heart rate 95 with known source -Sepsis protocol.  Follow-up culture data -IV Rocephin, azithromycin -Bronchodilators, I-S/flutter -No previous diagnosis of COPD -Check procalcitonin, BNP -Not on IV fluid due to concerns for fluid overload.  Acute respiratory distress, mild -Ongoing treatment for community-acquired pneumonia.  Possible mild volume overload.  Echocardiogram ordered.  Alcohol abuse -Drinks 2-3 beers every other day. -Multivitamin, thiamine, folate -Alcohol withdrawal protocol  Hypokalemia -Replete as needed  Essential hypertension -Clonidine 0.1 mg at bedtime  Anxiety and depression -Prozac  Hyperlipidemia -Pravachol  Diabetes mellitus type 2, uncontrolled Peripheral neuropathy secondary to DM2 -A1c-8.2 -Insulin sliding scale and Accu-Chek -Lantus 20 units at bedtime    DVT prophylaxis: Subcu Lovenox Code Status: Full code Family Communication:    Status is: Inpatient  Remains inpatient appropriate because:Inpatient level of care appropriate due to severity of illness.  Ongoing evaluation for sepsis likely secondary to  pneumonia and generalized weakness.   Dispo: The patient is from: Home              Anticipated d/c is to: Home              Patient currently is not medically stable to d/c.   Difficult to place patient No    Subjective: Seen and examined at bedside, felt slightly tremulous.  Blood glucose was greater than 250. She was afebrile during my visit.  She reports that she drinks about 2-3 beers every other day.  Review of Systems Otherwise negative except as per HPI, including: General: Denies fever, chills, night sweats or unintended weight loss. Resp: Denies wheezing Cardiac: Denies chest pain, palpitations, orthopnea, paroxysmal nocturnal dyspnea. GI: Denies abdominal pain, nausea, vomiting, diarrhea or constipation GU: Denies dysuria, frequency, hesitancy or incontinence MS: Denies muscle aches, joint pain or swelling Neuro: Denies headache, neurologic deficits (focal weakness, numbness, tingling), abnormal gait Psych: Denies anxiety, depression, SI/HI/AVH Skin: Denies new rashes or lesions ID: Denies sick contacts, exotic exposures, travel  Examination:  General exam: Appears calm and comfortable  Respiratory system: Bilateral rhonchi Cardiovascular system: S1 & S2 heard, RRR. No JVD, murmurs, rubs, gallops or clicks. No pedal edema. Gastrointestinal system: Abdomen is nondistended, soft and nontender. No organomegaly or masses felt. Normal bowel sounds heard. Central nervous system: Alert and oriented. No focal neurological deficits. Extremities: Symmetric 5 x 5 power. Skin: No rashes, lesions or ulcers Psychiatry: Judgement and insight appear normal. Mood & affect appropriate.     Objective: Vitals:   09/17/20 0200 09/17/20 0411 09/17/20 0521 09/17/20 0728  BP:   (!) 111/53 (!) 104/52  Pulse:   74 73  Resp:   14 19  Temp: 100.1 F (37.8 C)  99.4 F (37.4 C) 100.2 F (37.9  C)  TempSrc:   Oral Oral  SpO2:   95% 96%  Weight:  80.7 kg    Height:         Intake/Output Summary (Last 24 hours) at 09/17/2020 0749 Last data filed at 09/17/2020 0500 Gross per 24 hour  Intake 1338.08 ml  Output 300 ml  Net 1038.08 ml   Filed Weights   09/16/20 2100 09/17/20 0411  Weight: 80.7 kg 80.7 kg     Data Reviewed:   CBC: Recent Labs  Lab 09/16/20 1405 09/16/20 2129 09/17/20 0244  WBC 17.3* 15.9* 14.3*  HGB 11.8* 10.6* 9.7*  HCT 36.0 32.2* 29.3*  MCV 93.8 94.2 93.6  PLT 482* 415* 250   Basic Metabolic Panel: Recent Labs  Lab 09/16/20 1405 09/16/20 2129 09/17/20 0244  NA 138  --  136  K 3.2*  --  3.8  CL 102  --  106  CO2 21*  --  24  GLUCOSE 153*  --  147*  BUN 12  --  11  CREATININE 0.78 0.77 0.71  CALCIUM 8.8*  --  8.1*   GFR: Estimated Creatinine Clearance: 70.8 mL/min (by C-G formula based on SCr of 0.71 mg/dL). Liver Function Tests: Recent Labs  Lab 09/16/20 1405  AST 21  ALT 28  ALKPHOS 114  BILITOT 1.0  PROT 7.3  ALBUMIN 2.9*   Recent Labs  Lab 09/16/20 1405  LIPASE 22   No results for input(s): AMMONIA in the last 168 hours. Coagulation Profile: No results for input(s): INR, PROTIME in the last 168 hours. Cardiac Enzymes: No results for input(s): CKTOTAL, CKMB, CKMBINDEX, TROPONINI in the last 168 hours. BNP (last 3 results) No results for input(s): PROBNP in the last 8760 hours. HbA1C: Recent Labs    09/16/20 2129  HGBA1C 8.2*   CBG: Recent Labs  Lab 09/16/20 2249 09/17/20 0531  GLUCAP 166* 130*   Lipid Profile: No results for input(s): CHOL, HDL, LDLCALC, TRIG, CHOLHDL, LDLDIRECT in the last 72 hours. Thyroid Function Tests: No results for input(s): TSH, T4TOTAL, FREET4, T3FREE, THYROIDAB in the last 72 hours. Anemia Panel: No results for input(s): VITAMINB12, FOLATE, FERRITIN, TIBC, IRON, RETICCTPCT in the last 72 hours. Sepsis Labs: No results for input(s): PROCALCITON, LATICACIDVEN in the last 168 hours.  Recent Results (from the past 240 hour(s))  Resp Panel by RT-PCR (Flu  A&B, Covid) Nasopharyngeal Swab     Status: None   Collection Time: 09/16/20  2:57 PM   Specimen: Nasopharyngeal Swab; Nasopharyngeal(NP) swabs in vial transport medium  Result Value Ref Range Status   SARS Coronavirus 2 by RT PCR NEGATIVE NEGATIVE Final    Comment: (NOTE) SARS-CoV-2 target nucleic acids are NOT DETECTED.  The SARS-CoV-2 RNA is generally detectable in upper respiratory specimens during the acute phase of infection. The lowest concentration of SARS-CoV-2 viral copies this assay can detect is 138 copies/mL. A negative result does not preclude SARS-Cov-2 infection and should not be used as the sole basis for treatment or other patient management decisions. A negative result may occur with  improper specimen collection/handling, submission of specimen other than nasopharyngeal swab, presence of viral mutation(s) within the areas targeted by this assay, and inadequate number of viral copies(<138 copies/mL). A negative result must be combined with clinical observations, patient history, and epidemiological information. The expected result is Negative.  Fact Sheet for Patients:  EntrepreneurPulse.com.au  Fact Sheet for Healthcare Providers:  IncredibleEmployment.be  This test is no t yet approved or cleared by the [place]  States FDA and  has been authorized for detection and/or diagnosis of SARS-CoV-2 by FDA under an Emergency Use Authorization (EUA). This EUA will remain  in effect (meaning this test can be used) for the duration of the COVID-19 declaration under Section 564(b)(1) of the Act, 21 U.S.C.section 360bbb-3(b)(1), unless the authorization is terminated  or revoked sooner.       Influenza A by PCR NEGATIVE NEGATIVE Final   Influenza B by PCR NEGATIVE NEGATIVE Final    Comment: (NOTE) The Xpert Xpress SARS-CoV-2/FLU/RSV plus assay is intended as an aid in the diagnosis of influenza from Nasopharyngeal swab specimens  and should not be used as a sole basis for treatment. Nasal washings and aspirates are unacceptable for Xpert Xpress SARS-CoV-2/FLU/RSV testing.  Fact Sheet for Patients: EntrepreneurPulse.com.au  Fact Sheet for Healthcare Providers: IncredibleEmployment.be  This test is not yet approved or cleared by the [place] FDA and has been authorized for detection and/or diagnosis of SARS-CoV-2 by FDA under an Emergency Use Authorization (EUA). This EUA will remain in effect (meaning this test can be used) for the duration of the COVID-19 declaration under Section 564(b)(1) of the Act, 21 U.S.C. section 360bbb-3(b)(1), unless the authorization is terminated or revoked.  Performed at [facility], [place] [address] [place], [place] [address]          Radiology Studies: DG Chest Portable 1 View  Result Date: 09/16/2020 CLINICAL DATA:  Shortness of breath and fever EXAM: PORTABLE CHEST 1 VIEW COMPARISON:  January 12, 2015 FINDINGS: There is chronic elevation of the right hemidiaphragm. There is ill-defined airspace opacity throughout portions of the right upper and lower lung regions. The left lung is clear. Heart size and pulmonary vascularity are normal. No adenopathy. There is aortic atherosclerosis. There is postoperative change in the proximal left humerus. There is a calcified breast implant on each side. IMPRESSION: Stable elevation of the right hemidiaphragm. Areas of airspace opacity in the right upper and mid lung regions consistent with multifocal pneumonia. No appreciable consolidation. Note that atypical organism pneumonia could present in this manner. Left lung clear. Heart size normal. Aortic Atherosclerosis (ICD10-I70.0). Electronically Signed   By: Lowella Grip III M.D.   On: 09/16/2020 15:47        Scheduled Meds: . cloNIDine  0.1 mg Oral QHS  . enoxaparin (LOVENOX) injection  40 mg Subcutaneous Q24H  . FLUoxetine  40 mg  Oral Daily  . insulin aspart  0-15 Units Subcutaneous TID WC  . insulin glargine  20 Units Subcutaneous QHS  . pravastatin  80 mg Oral Daily   Continuous Infusions: . sodium chloride 100 mL/hr at 09/16/20 2159  . azithromycin 500 mg (09/16/20 2209)  . cefTRIAXone (ROCEPHIN)  IV       LOS: 1 day   Time spent= 35 mins    Dymond Spreen Arsenio Loader, MD [facility]  If 7PM-7AM, please contact night-coverage  09/17/2020, 7:49 AM

## 2020-09-18 ENCOUNTER — Other Ambulatory Visit (HOSPITAL_COMMUNITY): Payer: Medicare HMO

## 2020-09-18 ENCOUNTER — Inpatient Hospital Stay (HOSPITAL_COMMUNITY): Payer: Medicare HMO

## 2020-09-18 DIAGNOSIS — R079 Chest pain, unspecified: Secondary | ICD-10-CM | POA: Diagnosis not present

## 2020-09-18 LAB — CBC
HCT: 29.9 % — ABNORMAL LOW (ref 36.0–46.0)
Hemoglobin: 9.6 g/dL — ABNORMAL LOW (ref 12.0–15.0)
MCH: 30.6 pg (ref 26.0–34.0)
MCHC: 32.1 g/dL (ref 30.0–36.0)
MCV: 95.2 fL (ref 80.0–100.0)
Platelets: 365 10*3/uL (ref 150–400)
RBC: 3.14 MIL/uL — ABNORMAL LOW (ref 3.87–5.11)
RDW: 14 % (ref 11.5–15.5)
WBC: 11.3 10*3/uL — ABNORMAL HIGH (ref 4.0–10.5)
nRBC: 0 % (ref 0.0–0.2)

## 2020-09-18 LAB — GLUCOSE, CAPILLARY
Glucose-Capillary: 140 mg/dL — ABNORMAL HIGH (ref 70–99)
Glucose-Capillary: 223 mg/dL — ABNORMAL HIGH (ref 70–99)
Glucose-Capillary: 151 mg/dL — ABNORMAL HIGH (ref 70–99)
Glucose-Capillary: 188 mg/dL — ABNORMAL HIGH (ref 70–99)

## 2020-09-18 LAB — COMPREHENSIVE METABOLIC PANEL
ALT: 28 U/L (ref 0–44)
AST: 27 U/L (ref 15–41)
Albumin: 2.2 g/dL — ABNORMAL LOW (ref 3.5–5.0)
Alkaline Phosphatase: 101 U/L (ref 38–126)
Anion gap: 7 (ref 5–15)
BUN: 10 mg/dL (ref 8–23)
CO2: 22 mmol/L (ref 22–32)
Calcium: 8.2 mg/dL — ABNORMAL LOW (ref 8.9–10.3)
Chloride: 111 mmol/L (ref 98–111)
Creatinine, Ser: 0.64 mg/dL (ref 0.44–1.00)
GFR, Estimated: >60 mL/min (ref >60)
Glucose, Bld: 170 mg/dL — ABNORMAL HIGH (ref 70–99)
Potassium: 3.7 mmol/L (ref 3.5–5.1)
Sodium: 140 mmol/L (ref 135–145)
Total Bilirubin: 0.5 mg/dL (ref 0.3–1.2)
Total Protein: 5.6 g/dL — ABNORMAL LOW (ref 6.5–8.1)

## 2020-09-18 LAB — ECHOCARDIOGRAM COMPLETE
AR max vel: 2.45 cm2
AV Area VTI: 2.24 cm2
AV Area mean vel: 2.33 cm2
AV Mean grad: 5 mmHg
AV Peak grad: 8.9 mmHg
Ao pk vel: 1.49 m/s
Area-P 1/2: 4.96 cm2
Height: 68 in
S' Lateral: 3.1 cm
Weight: 2850.11 oz

## 2020-09-18 LAB — MAGNESIUM: Magnesium: 2.1 mg/dL (ref 1.7–2.4)

## 2020-09-18 MED ORDER — INSULIN ASPART 100 UNIT/ML ~~LOC~~ SOLN
3.0000 [IU] | Freq: Three times a day (TID) | SUBCUTANEOUS | Status: DC
  Administered 2020-09-18 – 2020-09-21 (×8): 3 [IU] via SUBCUTANEOUS

## 2020-09-18 MED ORDER — POTASSIUM CHLORIDE CRYS ER 20 MEQ PO TBCR
40.0000 meq | EXTENDED_RELEASE_TABLET | Freq: Once | ORAL | Status: AC
  Administered 2020-09-18: 40 meq via ORAL
  Filled 2020-09-18: qty 2

## 2020-09-18 MED ORDER — FUROSEMIDE 10 MG/ML IJ SOLN
40.0000 mg | Freq: Once | INTRAMUSCULAR | Status: AC
  Administered 2020-09-18: 40 mg via INTRAVENOUS
  Filled 2020-09-18: qty 4

## 2020-09-18 MED ORDER — IPRATROPIUM-ALBUTEROL 0.5-2.5 (3) MG/3ML IN SOLN
3.0000 mL | Freq: Two times a day (BID) | RESPIRATORY_TRACT | Status: DC
  Administered 2020-09-18 – 2020-09-19 (×3): 3 mL via RESPIRATORY_TRACT
  Filled 2020-09-18 (×3): qty 3

## 2020-09-18 MED ORDER — INSULIN GLARGINE 100 UNIT/ML ~~LOC~~ SOLN
22.0000 [IU] | Freq: Every day | SUBCUTANEOUS | Status: DC
  Administered 2020-09-18 – 2020-09-20 (×3): 22 [IU] via SUBCUTANEOUS
  Filled 2020-09-18 (×4): qty 0.22

## 2020-09-18 MED ORDER — LIDOCAINE 5 % EX PTCH
1.0000 | MEDICATED_PATCH | CUTANEOUS | Status: DC
  Administered 2020-09-18: 1 via TRANSDERMAL
  Filled 2020-09-18 (×4): qty 1

## 2020-09-18 NOTE — Progress Notes (Signed)
PROGRESS NOTE    Kelli [name]  KKX:[id number] DOB: [birth year] DOA: 09/16/2020 PCP: Emeterio Reeve, DO   Brief Narrative:  75 year old with history of HTN, DM2, lung cancer status post lobectomy, history of tobacco use admitted for generalized progressive weakness with malaise nonproductive cough.  As outpatient she was started on azithromycin and albuterol but due to progressive symptoms came to the hospital.  Chest x-ray showed evidence of multifocal pneumonia..  Diagnosed with sepsis secondary to pneumonia started on antibiotics.   Assessment & Plan:   Principal Problem:   Sepsis ([place]) Active Problems:   Hyperlipidemia   Lung cancer, lower lobe (HCC)   Diabetes mellitus type 2, uncomplicated ([place])   Multifocal pneumonia    Sepsis due to multifocal pneumonia Extensive tobacco use in the past -Initially sepsis secondary to fever, tachycardia, leukocytosis and source.  Sepsis physiology now improving. -Sepsis protocol.  Follow-up culture data -IV Rocephin, azithromycin -Bronchodilators, I-S/flutter -No previous diagnosis of COPD -Check procalcitonin, BNP -Not on IV fluid due to concerns for fluid overload.  Acute respiratory distress with hypoxia, mild -Patient desaturates down to 84% with standing as documented -Ongoing treatment for community-acquired pneumonia.  Possible mild volume overload.   -Echocardiogram done, results pending -Lasix 40 mg IV once  Alcohol abuse -Drinks 2-3 beers every other day. -Multivitamin, thiamine, folate -Alcohol withdrawal protocol  Hypokalemia -Replete as needed  Essential hypertension -Clonidine 0.1 mg at bedtime  Anxiety and depression -Prozac  Hyperlipidemia -Pravachol  Diabetes mellitus type 2, uncontrolled Peripheral neuropathy secondary to DM2 -A1c-8.2 -Insulin sliding scale and Accu-Chek -Lantus 22 units at bedtime -NovoLog 3 units premeals    DVT prophylaxis: Subcu Lovenox Code Status: Full  code Family Communication:  Doesn't want to call anyone.   Status is: Inpatient  Remains inpatient appropriate because:Inpatient level of care appropriate due to severity of illness.  Ongoing evaluation for sepsis likely secondary to pneumonia and generalized weakness.   Dispo: The patient is from: Home              Anticipated d/c is to: Home              Patient currently is not medically stable to d/c.   Difficult to place patient No    Subjective: Feels ok, having lots of back ache due to the mattress. Still having coughing spells  Review of Systems Otherwise negative except as per HPI, including: General = no fevers, chills, dizziness,  fatigue HEENT/EYES = negative for loss of vision, double vision, blurred vision,  sore throa Cardiovascular= negative for chest pain, palpitation Respiratory/lungs= negative for  hemoptysis,  Gastrointestinal= negative for nausea, vomiting, abdominal pain Genitourinary= negative for Dysuria MSK = Negative for arthralgia, myalgias Neurology= Negative for headache, numbness, tingling  Psychiatry= Negative for suicidal and homocidal ideation Skin= Negative for Rash   Examination: Constitutional: Not in acute distress Respiratory: Bilateral coarse breath sounds Cardiovascular: Normal sinus rhythm, no rubs Abdomen: Nontender nondistended good bowel sounds Musculoskeletal: No edema noted Skin: No rashes seen Neurologic: CN 2-12 grossly intact.  And nonfocal Psychiatric: Normal judgment and insight. Alert and oriented x 3. Normal mood.     Objective: Vitals:   09/17/20 1936 09/18/20 0034 09/18/20 0343 09/18/20 0722  BP:  (!) 119/59 (!) 123/47 132/65  Pulse:  78 80 76  Resp:  17 19 20   Temp:  98.6 F (37 C) 98.3 F (36.8 C) 98.7 F (37.1 C)  TempSrc:  Oral Oral Oral  SpO2: 99% 93% 91% 93%  Weight:  80.8 kg   Height:        Intake/Output Summary (Last 24 hours) at 09/18/2020 0729 Last data filed at 09/18/2020 0404 Gross per 24  hour  Intake 2444.56 ml  Output 675 ml  Net 1769.56 ml   Filed Weights   09/16/20 2100 09/17/20 0411 09/18/20 0343  Weight: 80.7 kg 80.7 kg 80.8 kg     Data Reviewed:   CBC: Recent Labs  Lab 09/16/20 1405 09/16/20 2129 09/17/20 0244 09/17/20 1114 09/18/20 0201  WBC 17.3* 15.9* 14.3* 12.6* 11.3*  HGB 11.8* 10.6* 9.7* 10.2* 9.6*  HCT 36.0 32.2* 29.3* 30.7* 29.9*  MCV 93.8 94.2 93.6 93.9 95.2  PLT 482* 415* 385 384 416   Basic Metabolic Panel: Recent Labs  Lab 09/16/20 1405 09/16/20 2129 09/17/20 0244 09/17/20 1114 09/18/20 0201  NA 138  --  136 137 140  K 3.2*  --  3.8 4.0 3.7  CL 102  --  106 108 111  CO2 21*  --  24 21* 22  GLUCOSE 153*  --  147* 158* 170*  BUN 12  --  11 13 10   CREATININE 0.78 0.77 0.71 0.75 0.64  CALCIUM 8.8*  --  8.1* 8.3* 8.2*  MG  --   --   --  1.9 2.1  PHOS  --   --   --  2.2*  --    GFR: Estimated Creatinine Clearance: 70.9 mL/min (by C-G formula based on SCr of 0.64 mg/dL). Liver Function Tests: Recent Labs  Lab 09/16/20 1405 09/17/20 1114 09/18/20 0201  AST 21 27 27   ALT 28 24 28   ALKPHOS 114 106 101  BILITOT 1.0 0.8 0.5  PROT 7.3 6.0* 5.6*  ALBUMIN 2.9* 2.3* 2.2*   Recent Labs  Lab 09/16/20 1405  LIPASE 22   No results for input(s): AMMONIA in the last 168 hours. Coagulation Profile: No results for input(s): INR, PROTIME in the last 168 hours. Cardiac Enzymes: No results for input(s): CKTOTAL, CKMB, CKMBINDEX, TROPONINI in the last 168 hours. BNP (last 3 results) No results for input(s): PROBNP in the last 8760 hours. HbA1C: Recent Labs    09/16/20 2129  HGBA1C 8.2*   CBG: Recent Labs  Lab 09/17/20 0947 09/17/20 1145 09/17/20 1601 09/17/20 2126 09/18/20 0625  GLUCAP 161* 165* 174* 278* 140*   Lipid Profile: No results for input(s): CHOL, HDL, LDLCALC, TRIG, CHOLHDL, LDLDIRECT in the last 72 hours. Thyroid Function Tests: No results for input(s): TSH, T4TOTAL, FREET4, T3FREE, THYROIDAB in the last 72  hours. Anemia Panel: No results for input(s): VITAMINB12, FOLATE, FERRITIN, TIBC, IRON, RETICCTPCT in the last 72 hours. Sepsis Labs: Recent Labs  Lab 09/17/20 0829 09/17/20 1114  PROCALCITON 0.17  --   LATICACIDVEN 0.9 1.1    Recent Results (from the past 240 hour(s))  Resp Panel by RT-PCR (Flu A&B, Covid) Nasopharyngeal Swab     Status: None   Collection Time: 09/16/20  2:57 PM   Specimen: Nasopharyngeal Swab; Nasopharyngeal(NP) swabs in vial transport medium  Result Value Ref Range Status   SARS Coronavirus 2 by RT PCR NEGATIVE NEGATIVE Final    Comment: (NOTE) SARS-CoV-2 target nucleic acids are NOT DETECTED.  The SARS-CoV-2 RNA is generally detectable in upper respiratory specimens during the acute phase of infection. The lowest concentration of SARS-CoV-2 viral copies this assay can detect is 138 copies/mL. A negative result does not preclude SARS-Cov-2 infection and should not be used as the sole basis for treatment or other patient management  decisions. A negative result may occur with  improper specimen collection/handling, submission of specimen other than nasopharyngeal swab, presence of viral mutation(s) within the areas targeted by this assay, and inadequate number of viral copies(<138 copies/mL). A negative result must be combined with clinical observations, patient history, and epidemiological information. The expected result is Negative.  Fact Sheet for Patients:  EntrepreneurPulse.com.au  Fact Sheet for Healthcare Providers:  IncredibleEmployment.be  This test is no t yet approved or cleared by the [place] FDA and  has been authorized for detection and/or diagnosis of SARS-CoV-2 by FDA under an Emergency Use Authorization (EUA). This EUA will remain  in effect (meaning this test can be used) for the duration of the COVID-19 declaration under Section 564(b)(1) of the Act, 21 U.S.C.section 360bbb-3(b)(1), unless the  authorization is terminated  or revoked sooner.       Influenza A by PCR NEGATIVE NEGATIVE Final   Influenza B by PCR NEGATIVE NEGATIVE Final    Comment: (NOTE) The Xpert Xpress SARS-CoV-2/FLU/RSV plus assay is intended as an aid in the diagnosis of influenza from Nasopharyngeal swab specimens and should not be used as a sole basis for treatment. Nasal washings and aspirates are unacceptable for Xpert Xpress SARS-CoV-2/FLU/RSV testing.  Fact Sheet for Patients: EntrepreneurPulse.com.au  Fact Sheet for Healthcare Providers: IncredibleEmployment.be  This test is not yet approved or cleared by the [place] FDA and has been authorized for detection and/or diagnosis of SARS-CoV-2 by FDA under an Emergency Use Authorization (EUA). This EUA will remain in effect (meaning this test can be used) for the duration of the COVID-19 declaration under Section 564(b)(1) of the Act, 21 U.S.C. section 360bbb-3(b)(1), unless the authorization is terminated or revoked.  Performed at [facility], [place] [address] [place], [place] [address]   Culture, blood (routine x 2)     Status: None (Preliminary result)   Collection Time: 09/16/20  5:00 PM   Specimen: BLOOD  Result Value Ref Range Status   Specimen Description BLOOD SITE NOT SPECIFIED  Final   Special Requests   Final    BOTTLES DRAWN AEROBIC AND ANAEROBIC Blood Culture results may not be optimal due to an excessive volume of blood received in culture bottles   Culture   Final    NO GROWTH < 12 HOURS Performed at [facility], [place] [address], [place], [place] [address]    Report Status PENDING  Incomplete  Culture, blood (routine x 2)     Status: None (Preliminary result)   Collection Time: 09/16/20  5:07 PM   Specimen: BLOOD  Result Value Ref Range Status   Specimen Description BLOOD LEFT ANTECUBITAL  Final   Special Requests   Final    BOTTLES DRAWN AEROBIC AND ANAEROBIC Blood  Culture adequate volume   Culture   Final    NO GROWTH < 12 HOURS Performed at [facility], [place] [address], [place], [place] [address]    Report Status PENDING  Incomplete         Radiology Studies: DG Chest Portable 1 View  Result Date: 09/16/2020 CLINICAL DATA:  Shortness of breath and fever EXAM: PORTABLE CHEST 1 VIEW COMPARISON:  January 12, 2015 FINDINGS: There is chronic elevation of the right hemidiaphragm. There is ill-defined airspace opacity throughout portions of the right upper and lower lung regions. The left lung is clear. Heart size and pulmonary vascularity are normal. No adenopathy. There is aortic atherosclerosis. There is postoperative change in the proximal  left humerus. There is a calcified breast implant on each side. IMPRESSION: Stable elevation of the right hemidiaphragm. Areas of airspace opacity in the right upper and mid lung regions consistent with multifocal pneumonia. No appreciable consolidation. Note that atypical organism pneumonia could present in this manner. Left lung clear. Heart size normal. Aortic Atherosclerosis (ICD10-I70.0). Electronically Signed   By: Lowella Grip III M.D.   On: 09/16/2020 15:47        Scheduled Meds: . budesonide (PULMICORT) nebulizer solution  0.5 mg Nebulization BID  . cloNIDine  0.1 mg Oral QHS  . enoxaparin (LOVENOX) injection  40 mg Subcutaneous Q24H  . FLUoxetine  40 mg Oral Daily  . folic acid  1 mg Oral Daily  . insulin aspart  0-15 Units Subcutaneous TID WC  . insulin aspart  0-5 Units Subcutaneous QHS  . insulin glargine  20 Units Subcutaneous QHS  . ipratropium-albuterol  3 mL Nebulization BID  . multivitamin with minerals  1 tablet Oral Daily  . pravastatin  80 mg Oral Daily  . thiamine  100 mg Oral Daily   Or  . thiamine  100 mg Intravenous Daily   Continuous Infusions: . azithromycin 500 mg (09/17/20 2028)  . cefTRIAXone (ROCEPHIN)  IV Stopped (09/17/20 1727)     LOS: 2 days   Time spent=  35 mins    Heru Montz Arsenio Loader, MD [facility]  If 7PM-7AM, please contact night-coverage  09/18/2020, 7:29 AM

## 2020-09-18 NOTE — Progress Notes (Signed)
  Echocardiogram 2D Echocardiogram has been performed.  Merrie Roof F 09/18/2020, 4:57 PM

## 2020-09-19 ENCOUNTER — Inpatient Hospital Stay (HOSPITAL_COMMUNITY): Payer: Medicare HMO

## 2020-09-19 LAB — COMPREHENSIVE METABOLIC PANEL
ALT: 28 U/L (ref 0–44)
AST: 25 U/L (ref 15–41)
Albumin: 2 g/dL — ABNORMAL LOW (ref 3.5–5.0)
Alkaline Phosphatase: 105 U/L (ref 38–126)
Anion gap: 8 (ref 5–15)
BUN: 8 mg/dL (ref 8–23)
CO2: 26 mmol/L (ref 22–32)
Calcium: 8.2 mg/dL — ABNORMAL LOW (ref 8.9–10.3)
Chloride: 104 mmol/L (ref 98–111)
Creatinine, Ser: 0.53 mg/dL (ref 0.44–1.00)
GFR, Estimated: >60 mL/min (ref >60)
Glucose, Bld: 126 mg/dL — ABNORMAL HIGH (ref 70–99)
Potassium: 3.6 mmol/L (ref 3.5–5.1)
Sodium: 138 mmol/L (ref 135–145)
Total Bilirubin: 0.4 mg/dL (ref 0.3–1.2)
Total Protein: 5.5 g/dL — ABNORMAL LOW (ref 6.5–8.1)

## 2020-09-19 LAB — EXPECTORATED SPUTUM ASSESSMENT W GRAM STAIN, RFLX TO RESP C: Special Requests: NORMAL

## 2020-09-19 LAB — CBC
HCT: 28.8 % — ABNORMAL LOW (ref 36.0–46.0)
Hemoglobin: 9.3 g/dL — ABNORMAL LOW (ref 12.0–15.0)
MCH: 30.1 pg (ref 26.0–34.0)
MCHC: 32.3 g/dL (ref 30.0–36.0)
MCV: 93.2 fL (ref 80.0–100.0)
Platelets: 403 10*3/uL — ABNORMAL HIGH (ref 150–400)
RBC: 3.09 MIL/uL — ABNORMAL LOW (ref 3.87–5.11)
RDW: 14 % (ref 11.5–15.5)
WBC: 11.3 10*3/uL — ABNORMAL HIGH (ref 4.0–10.5)
nRBC: 0 % (ref 0.0–0.2)

## 2020-09-19 LAB — GLUCOSE, CAPILLARY
Glucose-Capillary: 124 mg/dL — ABNORMAL HIGH (ref 70–99)
Glucose-Capillary: 117 mg/dL — ABNORMAL HIGH (ref 70–99)
Glucose-Capillary: 153 mg/dL — ABNORMAL HIGH (ref 70–99)
Glucose-Capillary: 137 mg/dL — ABNORMAL HIGH (ref 70–99)
Glucose-Capillary: 198 mg/dL — ABNORMAL HIGH (ref 70–99)

## 2020-09-19 LAB — URINALYSIS, ROUTINE W REFLEX MICROSCOPIC
Bilirubin Urine: NEGATIVE
Glucose, UA: NEGATIVE mg/dL
Ketones, ur: NEGATIVE mg/dL
Nitrite: NEGATIVE
Protein, ur: NEGATIVE mg/dL
Specific Gravity, Urine: 1.008 (ref 1.005–1.030)
pH: 6 (ref 5.0–8.0)

## 2020-09-19 LAB — MAGNESIUM: Magnesium: 1.8 mg/dL (ref 1.7–2.4)

## 2020-09-19 LAB — PROCALCITONIN: Procalcitonin: <0.1 ng/mL

## 2020-09-19 MED ORDER — IOHEXOL 9 MG/ML PO SOLN
500.0000 mL | ORAL | Status: AC
  Administered 2020-09-19 (×2): 500 mL via ORAL

## 2020-09-19 MED ORDER — DICLOFENAC SODIUM 1 % EX GEL
2.0000 g | Freq: Four times a day (QID) | CUTANEOUS | Status: DC | PRN
  Administered 2020-09-19: 2 g via TOPICAL
  Filled 2020-09-19: qty 100

## 2020-09-19 MED ORDER — CYCLOBENZAPRINE HCL 5 MG PO TABS
5.0000 mg | ORAL_TABLET | Freq: Three times a day (TID) | ORAL | Status: DC | PRN
  Administered 2020-09-19 – 2020-09-21 (×3): 5 mg via ORAL
  Filled 2020-09-19 (×3): qty 1

## 2020-09-19 MED ORDER — MAGNESIUM OXIDE 400 (241.3 MG) MG PO TABS
800.0000 mg | ORAL_TABLET | Freq: Once | ORAL | Status: AC
  Administered 2020-09-19: 800 mg via ORAL
  Filled 2020-09-19: qty 2

## 2020-09-19 MED ORDER — SODIUM CHLORIDE 0.9 % IV SOLN: 1.0000 g | Freq: Three times a day (TID) | INTRAVENOUS | Status: DC

## 2020-09-19 MED ORDER — AZITHROMYCIN 250 MG PO TABS
500.0000 mg | ORAL_TABLET | Freq: Every day | ORAL | Status: AC
  Administered 2020-09-19 – 2020-09-21 (×3): 500 mg via ORAL
  Filled 2020-09-19 (×3): qty 2

## 2020-09-19 MED ORDER — IOHEXOL 300 MG/ML  SOLN
100.0000 mL | Freq: Once | INTRAMUSCULAR | Status: AC | PRN
  Administered 2020-09-19: 100 mL via INTRAVENOUS

## 2020-09-19 MED ORDER — SODIUM CHLORIDE 0.9 % IV SOLN
2.0000 g | Freq: Three times a day (TID) | INTRAVENOUS | Status: DC
  Administered 2020-09-19 – 2020-09-21 (×7): 2 g via INTRAVENOUS
  Filled 2020-09-19 (×8): qty 2

## 2020-09-19 MED ORDER — FUROSEMIDE 10 MG/ML IJ SOLN
40.0000 mg | Freq: Once | INTRAMUSCULAR | Status: AC
  Administered 2020-09-19: 40 mg via INTRAVENOUS
  Filled 2020-09-19: qty 4

## 2020-09-19 MED ORDER — POTASSIUM CHLORIDE CRYS ER 20 MEQ PO TBCR
40.0000 meq | EXTENDED_RELEASE_TABLET | Freq: Once | ORAL | Status: AC
  Administered 2020-09-19: 40 meq via ORAL
  Filled 2020-09-19: qty 2

## 2020-09-19 NOTE — Progress Notes (Signed)
Noticed temp increased to 99.1, encouraged pt to do Incentive spirometer to help decrease low grade temp. Pt states don't want to cough has she becomes incontinent.

## 2020-09-19 NOTE — Plan of Care (Signed)
  Problem: Activity: Goal: Ability to tolerate increased activity will improve Outcome: Progressing   Problem: Clinical Measurements: Goal: Ability to maintain a body temperature in the normal range will improve Outcome: Progressing   Problem: Respiratory: Goal: Ability to maintain adequate ventilation will improve Outcome: Progressing Goal: Ability to maintain a clear airway will improve Outcome: Progressing   

## 2020-09-19 NOTE — Progress Notes (Addendum)
PROGRESS NOTE    Kelli [name]  XQJ:[id number] DOB: 1946/05/28 DOA: 09/16/2020 PCP: Emeterio Reeve, DO   Brief Narrative:  75 year old with history of HTN, DM2, lung cancer status post lobectomy, history of tobacco use admitted for generalized progressive weakness with malaise nonproductive cough.  As outpatient she was started on azithromycin and albuterol but due to progressive symptoms came to the hospital.  Chest x-ray showed evidence of multifocal pneumonia..  Diagnosed with sepsis secondary to pneumonia started on antibiotics.  Due to concerns of volume overload received Lasix, echocardiogram showed EF 60 to 65%.   Assessment & Plan:   Principal Problem:   Sepsis ([place]) Active Problems:   Hyperlipidemia   Lung cancer, lower lobe (HCC)   Diabetes mellitus type 2, uncomplicated ([place])   Multifocal pneumonia    Sepsis due to multifocal pneumonia Extensive tobacco use in the past -Initially sepsis secondary to fever, tachycardia, leukocytosis and source.  Patient had fever overnight.  Did not receive fluids on admission due to signs of slight volume overload -Sepsis protocol.  Cultures-negative.  Procalcitonin-negative -Changed to p.o. azithromycin.  Change IV Rocephin to cefepime -Bronchodilators, I-S/flutter -No previous diagnosis of COPD -Obtain CT abdomen pelvis with contrast due to low abdominal and back pain to evaluate for any infectious etiology Repeat Cultures UA not sent on admission, reordered.   Acute respiratory distress with hypoxia, mild -Patient desaturates down to 84% with standing as documented -Ongoing treatment for community-acquired pneumonia.  Possible mild volume overload.   -Echocardiogram-EF 60 to 65% -Lasix 40 mg IV X1 dose today  Alcohol abuse -Drinks 2-3 beers every other day. -Multivitamin, thiamine, folate -Alcohol withdrawal protocol  Hypokalemia -Replete as needed  Essential hypertension -Clonidine 0.1 mg at bedtime  Anxiety  and depression -Prozac  Hyperlipidemia -Pravachol  Diabetes mellitus type 2, uncontrolled Peripheral neuropathy secondary to DM2 -A1c-8.2 -Insulin sliding scale and Accu-Chek -Lantus 22 units at bedtime -NovoLog 3 units premeals    DVT prophylaxis: Subcu Lovenox Code Status: Full code Family Communication:  Remo Lipps updated.   Status is: Inpatient  Remains inpatient appropriate because:Inpatient level of care appropriate due to severity of illness.  Patient with fever overnight, broaden antibiotic coverage.  IV Lasix today.   Dispo: The patient is from: Home              Anticipated d/c is to: Home              Patient currently is not medically stable to d/c.   Difficult to place patient No    Subjective: Complains of low back pain with occasional lower abdominal pain. She had fever overnight despite of being on antibiotics  Review of Systems Otherwise negative except as per HPI, including: General: Denies  night sweats or unintended weight loss. Resp: Denies cough, wheezing, shortness of breath. Cardiac: Denies chest pain, palpitations, orthopnea, paroxysmal nocturnal dyspnea. GI: Denies abdominal pain, nausea, vomiting, diarrhea or constipation GU: Denies dysuria, frequency, hesitancy or incontinence MS: Denies swelling Neuro: Denies headache, neurologic deficits (focal weakness, numbness, tingling), abnormal gait Psych: Denies anxiety, depression, SI/HI/AVH Skin: Denies new rashes or lesions ID: Denies sick contacts, exotic exposures, travel  Examination:  Constitutional: Not in acute distress Respiratory: Clear to auscultation bilaterally Cardiovascular: Normal sinus rhythm, no rubs Abdomen: Nontender nondistended good bowel sounds Musculoskeletal: No edema noted Skin: No rashes seen Neurologic: CN 2-12 grossly intact.  And nonfocal Psychiatric: Normal judgment and insight. Alert and oriented x 3. Normal mood.  Objective: Vitals:   09/18/20 2134  09/19/20 0015 09/19/20 0449 09/19/20 0455  BP: (!) 153/64 (!) 109/58  (!) 125/58  Pulse: 89 73  72  Resp:  19  18  Temp: (!) 100.4 F (38 C) 100 F (37.8 C) 99.2 F (37.3 C) 99.2 F (37.3 C)  TempSrc: Oral Oral Oral Oral  SpO2: 93% 94%  96%  Weight:  79.1 kg    Height:        Intake/Output Summary (Last 24 hours) at 09/19/2020 0720 Last data filed at 09/18/2020 1920 Gross per 24 hour  Intake 940 ml  Output 500 ml  Net 440 ml   Filed Weights   09/17/20 0411 09/18/20 0343 09/19/20 0015  Weight: 80.7 kg 80.8 kg 79.1 kg     Data Reviewed:   CBC: Recent Labs  Lab 09/16/20 2129 09/17/20 0244 09/17/20 1114 09/18/20 0201 09/19/20 0254  WBC 15.9* 14.3* 12.6* 11.3* 11.3*  HGB 10.6* 9.7* 10.2* 9.6* 9.3*  HCT 32.2* 29.3* 30.7* 29.9* 28.8*  MCV 94.2 93.6 93.9 95.2 93.2  PLT 415* 385 384 365 408*   Basic Metabolic Panel: Recent Labs  Lab 09/16/20 1405 09/16/20 2129 09/17/20 0244 09/17/20 1114 09/18/20 0201 09/19/20 0254  NA 138  --  136 137 140 138  K 3.2*  --  3.8 4.0 3.7 3.6  CL 102  --  106 108 111 104  CO2 21*  --  24 21* 22 26  GLUCOSE 153*  --  147* 158* 170* 126*  BUN 12  --  11 13 10 8   CREATININE 0.78 0.77 0.71 0.75 0.64 0.53  CALCIUM 8.8*  --  8.1* 8.3* 8.2* 8.2*  MG  --   --   --  1.9 2.1 1.8  PHOS  --   --   --  2.2*  --   --    GFR: Estimated Creatinine Clearance: 70.2 mL/min (by C-G formula based on SCr of 0.53 mg/dL). Liver Function Tests: Recent Labs  Lab 09/16/20 1405 09/17/20 1114 09/18/20 0201 09/19/20 0254  AST 21 27 27 25   ALT 28 24 28 28   ALKPHOS 114 106 101 105  BILITOT 1.0 0.8 0.5 0.4  PROT 7.3 6.0* 5.6* 5.5*  ALBUMIN 2.9* 2.3* 2.2* 2.0*   Recent Labs  Lab 09/16/20 1405  LIPASE 22   No results for input(s): AMMONIA in the last 168 hours. Coagulation Profile: No results for input(s): INR, PROTIME in the last 168 hours. Cardiac Enzymes: No results for input(s): CKTOTAL, CKMB, CKMBINDEX, TROPONINI in the last 168  hours. BNP (last 3 results) No results for input(s): PROBNP in the last 8760 hours. HbA1C: Recent Labs    09/16/20 2129  HGBA1C 8.2*   CBG: Recent Labs  Lab 09/18/20 0625 09/18/20 1130 09/18/20 1706 09/18/20 2101 09/19/20 0627  GLUCAP 140* 223* 151* 188* 124*   Lipid Profile: No results for input(s): CHOL, HDL, LDLCALC, TRIG, CHOLHDL, LDLDIRECT in the last 72 hours. Thyroid Function Tests: No results for input(s): TSH, T4TOTAL, FREET4, T3FREE, THYROIDAB in the last 72 hours. Anemia Panel: No results for input(s): VITAMINB12, FOLATE, FERRITIN, TIBC, IRON, RETICCTPCT in the last 72 hours. Sepsis Labs: Recent Labs  Lab 09/17/20 0829 09/17/20 1114  PROCALCITON 0.17  --   LATICACIDVEN 0.9 1.1    Recent Results (from the past 240 hour(s))  Resp Panel by RT-PCR (Flu A&B, Covid) Nasopharyngeal Swab     Status: None   Collection Time: 09/16/20  2:57 PM   Specimen: Nasopharyngeal Swab; Nasopharyngeal(NP) swabs in vial transport medium  Result Value Ref Range Status   SARS Coronavirus 2 by RT PCR NEGATIVE NEGATIVE Final    Comment: (NOTE) SARS-CoV-2 target nucleic acids are NOT DETECTED.  The SARS-CoV-2 RNA is generally detectable in upper respiratory specimens during the acute phase of infection. The lowest concentration of SARS-CoV-2 viral copies this assay can detect is 138 copies/mL. A negative result does not preclude SARS-Cov-2 infection and should not be used as the sole basis for treatment or other patient management decisions. A negative result may occur with  improper specimen collection/handling, submission of specimen other than nasopharyngeal swab, presence of viral mutation(s) within the areas targeted by this assay, and inadequate number of viral copies(<138 copies/mL). A negative result must be combined with clinical observations, patient history, and epidemiological information. The expected result is Negative.  Fact Sheet for Patients:   EntrepreneurPulse.com.au  Fact Sheet for Healthcare Providers:  IncredibleEmployment.be  This test is no t yet approved or cleared by the [place] FDA and  has been authorized for detection and/or diagnosis of SARS-CoV-2 by FDA under an Emergency Use Authorization (EUA). This EUA will remain  in effect (meaning this test can be used) for the duration of the COVID-19 declaration under Section 564(b)(1) of the Act, 21 U.S.C.section 360bbb-3(b)(1), unless the authorization is terminated  or revoked sooner.       Influenza A by PCR NEGATIVE NEGATIVE Final   Influenza B by PCR NEGATIVE NEGATIVE Final    Comment: (NOTE) The Xpert Xpress SARS-CoV-2/FLU/RSV plus assay is intended as an aid in the diagnosis of influenza from Nasopharyngeal swab specimens and should not be used as a sole basis for treatment. Nasal washings and aspirates are unacceptable for Xpert Xpress SARS-CoV-2/FLU/RSV testing.  Fact Sheet for Patients: EntrepreneurPulse.com.au  Fact Sheet for Healthcare Providers: IncredibleEmployment.be  This test is not yet approved or cleared by the [place] FDA and has been authorized for detection and/or diagnosis of SARS-CoV-2 by FDA under an Emergency Use Authorization (EUA). This EUA will remain in effect (meaning this test can be used) for the duration of the COVID-19 declaration under Section 564(b)(1) of the Act, 21 U.S.C. section 360bbb-3(b)(1), unless the authorization is terminated or revoked.  Performed at [facility], [place] [address], [place], [place] [address]   Culture, blood (routine x 2)     Status: None (Preliminary result)   Collection Time: 09/16/20  5:00 PM   Specimen: BLOOD  Result Value Ref Range Status   Specimen Description BLOOD SITE NOT SPECIFIED  Final   Special Requests   Final    BOTTLES DRAWN AEROBIC AND ANAEROBIC Blood Culture results may not be optimal  due to an excessive volume of blood received in culture bottles   Culture   Final    NO GROWTH 2 DAYS Performed at [facility], [place] [address] [place], [place] [address]    Report Status PENDING  Incomplete  Culture, blood (routine x 2)     Status: None (Preliminary result)   Collection Time: 09/16/20  5:07 PM   Specimen: BLOOD  Result Value Ref Range Status   Specimen Description BLOOD LEFT ANTECUBITAL  Final   Special Requests   Final    BOTTLES DRAWN AEROBIC AND ANAEROBIC Blood Culture adequate volume   Culture   Final    NO GROWTH 2 DAYS Performed at [facility], [place] [address], [place], [place] [address]    Report Status PENDING  Incomplete  Radiology Studies: ECHOCARDIOGRAM COMPLETE  Result Date: 09/18/2020    ECHOCARDIOGRAM REPORT   Patient Name:   Kelli [name] Date of Exam: 09/18/2020 Medical Rec #:  [id number]    Height:       68.0 in Accession #:    [id number]   Weight:       178.1 lb Date of Birth:  08-17-1945    BSA:          1.946 m Patient Age:    21 years     BP:           126/50 mmHg Patient Gender: F            HR:           82 bpm. Exam Location:  Inpatient Procedure: 2D Echo, Cardiac Doppler and Color Doppler Indications:    R07.9* Chest pain, unspecified  Sonographer:    Merrie Roof RDCS Referring Phys: [id number] Choice Kleinsasser CHIRAG Quinnlan Abruzzo IMPRESSIONS  1. Left ventricular ejection fraction, by estimation, is 60 to 65%. The left ventricle has normal function. The left ventricle has no regional wall motion abnormalities. There is mild left ventricular hypertrophy. Left ventricular diastolic parameters are indeterminate.  2. Right ventricular systolic function is normal. The right ventricular size is normal.  3. The mitral valve is normal in structure. Trivial mitral valve regurgitation.  4. The aortic valve is normal in structure. Aortic valve regurgitation is not visualized.  5. The inferior vena cava is dilated in size with >50% respiratory variability,  suggesting right atrial pressure of 8 mmHg. FINDINGS  Left Ventricle: Left ventricular ejection fraction, by estimation, is 60 to 65%. The left ventricle has normal function. The left ventricle has no regional wall motion abnormalities. The left ventricular internal cavity size was normal in size. There is  mild left ventricular hypertrophy. Left ventricular diastolic parameters are indeterminate. Right Ventricle: The right ventricular size is normal. Right vetricular wall thickness was not assessed. Right ventricular systolic function is normal. Left Atrium: Left atrial size was normal in size. Right Atrium: Right atrial size was normal in size. Pericardium: There is no evidence of pericardial effusion. Mitral Valve: The mitral valve is normal in structure. Mild mitral annular calcification. Trivial mitral valve regurgitation. Tricuspid Valve: The tricuspid valve is normal in structure. Tricuspid valve regurgitation is trivial. Aortic Valve: The aortic valve is normal in structure. Aortic valve regurgitation is not visualized. Aortic valve mean gradient measures 5.0 mmHg. Aortic valve peak gradient measures 8.9 mmHg. Aortic valve area, by VTI measures 2.24 cm. Pulmonic Valve: The pulmonic valve was not well visualized. Pulmonic valve regurgitation is not visualized. No evidence of pulmonic stenosis. Aorta: The aortic root is normal in size and structure. Venous: The inferior vena cava is dilated in size with greater than 50% respiratory variability, suggesting right atrial pressure of 8 mmHg. IAS/Shunts: No atrial level shunt detected by color flow Doppler.  LEFT VENTRICLE PLAX 2D LVIDd:         4.50 cm  Diastology LVIDs:         3.10 cm  LV e' medial:    6.31 cm/s LV PW:         1.20 cm  LV E/e' medial:  14.5 LV IVS:        1.10 cm  LV e' lateral:   8.05 cm/s LVOT diam:     2.00 cm  LV E/e' lateral: 11.3 LV SV:         67 LV SV Index:  35 LVOT Area:     3.14 cm  RIGHT VENTRICLE          IVC RV Basal diam:  3.50  cm  IVC diam: 2.10 cm LEFT ATRIUM             Index       RIGHT ATRIUM           Index LA diam:        5.00 cm 2.57 cm/m  RA Area:     14.60 cm LA Vol (A2C):   52.5 ml 26.98 ml/m RA Volume:   37.50 ml  19.27 ml/m LA Vol (A4C):   67.0 ml 34.44 ml/m LA Biplane Vol: 64.3 ml 33.05 ml/m  AORTIC VALVE AV Area (Vmax):    2.45 cm AV Area (Vmean):   2.33 cm AV Area (VTI):     2.24 cm AV Vmax:           149.00 cm/s AV Vmean:          102.000 cm/s AV VTI:            0.300 m AV Peak Grad:      8.9 mmHg AV Mean Grad:      5.0 mmHg LVOT Vmax:         116.00 cm/s LVOT Vmean:        75.500 cm/s LVOT VTI:          0.214 m LVOT/AV VTI ratio: 0.71  AORTA Ao Root diam: 3.30 cm MITRAL VALVE MV Area (PHT): 4.96 cm     SHUNTS MV Decel Time: 153 msec     Systemic VTI:  0.21 m MV E velocity: 91.30 cm/s   Systemic Diam: 2.00 cm MV A velocity: 106.00 cm/s MV E/A ratio:  0.86 Dorris Carnes MD Electronically signed by Dorris Carnes MD Signature Date/Time: 09/18/2020/4:57:50 PM    Final         Scheduled Meds: . budesonide (PULMICORT) nebulizer solution  0.5 mg Nebulization BID  . cloNIDine  0.1 mg Oral QHS  . enoxaparin (LOVENOX) injection  40 mg Subcutaneous Q24H  . FLUoxetine  40 mg Oral Daily  . folic acid  1 mg Oral Daily  . insulin aspart  0-15 Units Subcutaneous TID WC  . insulin aspart  0-5 Units Subcutaneous QHS  . insulin aspart  3 Units Subcutaneous TID WC  . insulin glargine  22 Units Subcutaneous QHS  . ipratropium-albuterol  3 mL Nebulization BID  . lidocaine  1 patch Transdermal Q24H  . magnesium oxide  800 mg Oral Once  . multivitamin with minerals  1 tablet Oral Daily  . potassium chloride  40 mEq Oral Once  . pravastatin  80 mg Oral Daily  . thiamine  100 mg Oral Daily   Or  . thiamine  100 mg Intravenous Daily   Continuous Infusions: . azithromycin 500 mg (09/18/20 2144)  . cefTRIAXone (ROCEPHIN)  IV Stopped (09/18/20 1826)     LOS: 3 days   Time spent= 35 mins    Kahli Mayon Arsenio Loader,  MD [facility]  If 7PM-7AM, please contact night-coverage  09/19/2020, 7:20 AM

## 2020-09-19 NOTE — Progress Notes (Signed)
PHARMACY NOTE:  ANTIMICROBIAL RENAL DOSAGE ADJUSTMENT  Current antimicrobial regimen includes a mismatch between antimicrobial dosage and estimated renal function.  As per policy approved by the Pharmacy & Therapeutics and Medical Executive Committees, the antimicrobial dosage will be adjusted accordingly.  Current antimicrobial dosage:  Cefepime 1gm IV q8h  Indication: HCAP  Renal Function:  Estimated Creatinine Clearance: 70.2 mL/min (by C-G formula based on SCr of 0.53 mg/dL).     Antimicrobial dosage has been changed to:  Cefepime 2gm IV q8h   Thank you for allowing pharmacy to be a part of this patient's care.  Sherlon Handing, PharmD, BCPS Please see amion for complete clinical pharmacist phone list 09/19/2020 7:23 AM

## 2020-09-20 ENCOUNTER — Inpatient Hospital Stay (HOSPITAL_COMMUNITY): Payer: Medicare HMO

## 2020-09-20 DIAGNOSIS — A419 Sepsis, unspecified organism: Secondary | ICD-10-CM | POA: Diagnosis not present

## 2020-09-20 DIAGNOSIS — C3431 Malignant neoplasm of lower lobe, right bronchus or lung: Secondary | ICD-10-CM

## 2020-09-20 LAB — CBC
HCT: 31 % — ABNORMAL LOW (ref 36.0–46.0)
Hemoglobin: 10.1 g/dL — ABNORMAL LOW (ref 12.0–15.0)
MCH: 30.9 pg (ref 26.0–34.0)
MCHC: 32.6 g/dL (ref 30.0–36.0)
MCV: 94.8 fL (ref 80.0–100.0)
Platelets: 412 10*3/uL — ABNORMAL HIGH (ref 150–400)
RBC: 3.27 MIL/uL — ABNORMAL LOW (ref 3.87–5.11)
RDW: 13.7 % (ref 11.5–15.5)
WBC: 9.9 10*3/uL (ref 4.0–10.5)
nRBC: 0 % (ref 0.0–0.2)

## 2020-09-20 LAB — COMPREHENSIVE METABOLIC PANEL
ALT: 31 U/L (ref 0–44)
AST: 30 U/L (ref 15–41)
Albumin: 2.1 g/dL — ABNORMAL LOW (ref 3.5–5.0)
Alkaline Phosphatase: 100 U/L (ref 38–126)
Anion gap: 11 (ref 5–15)
BUN: 10 mg/dL (ref 8–23)
CO2: 26 mmol/L (ref 22–32)
Calcium: 8.6 mg/dL — ABNORMAL LOW (ref 8.9–10.3)
Chloride: 102 mmol/L (ref 98–111)
Creatinine, Ser: 0.64 mg/dL (ref 0.44–1.00)
GFR, Estimated: >60 mL/min (ref >60)
Glucose, Bld: 115 mg/dL — ABNORMAL HIGH (ref 70–99)
Potassium: 3.5 mmol/L (ref 3.5–5.1)
Sodium: 139 mmol/L (ref 135–145)
Total Bilirubin: 0.3 mg/dL (ref 0.3–1.2)
Total Protein: 5.7 g/dL — ABNORMAL LOW (ref 6.5–8.1)

## 2020-09-20 LAB — GLUCOSE, CAPILLARY
Glucose-Capillary: 119 mg/dL — ABNORMAL HIGH (ref 70–99)
Glucose-Capillary: 120 mg/dL — ABNORMAL HIGH (ref 70–99)
Glucose-Capillary: 152 mg/dL — ABNORMAL HIGH (ref 70–99)
Glucose-Capillary: 207 mg/dL — ABNORMAL HIGH (ref 70–99)

## 2020-09-20 LAB — MAGNESIUM: Magnesium: 1.9 mg/dL (ref 1.7–2.4)

## 2020-09-20 MED ORDER — GUAIFENESIN ER 600 MG PO TB12
1200.0000 mg | ORAL_TABLET | Freq: Two times a day (BID) | ORAL | Status: DC
  Administered 2020-09-20 – 2020-09-21 (×3): 1200 mg via ORAL
  Filled 2020-09-20 (×3): qty 2

## 2020-09-20 MED ORDER — POTASSIUM CHLORIDE CRYS ER 20 MEQ PO TBCR
40.0000 meq | EXTENDED_RELEASE_TABLET | Freq: Once | ORAL | Status: AC
  Administered 2020-09-20: 40 meq via ORAL
  Filled 2020-09-20: qty 2

## 2020-09-20 NOTE — Consult Note (Signed)
NAME:  Kelli [name], MRN:  [id number], DOB:  [birth year], LOS: 4 ADMISSION DATE:  09/16/2020, CONSULTATION DATE:  4/18 REFERRING MD:  Dr. Reesa Chew, CHIEF COMPLAINT:  Cough  History of Present Illness:  75 y/o F who presented to [facility] 4/14 with reports of approximately 7-10 days of productive cough, fevers, N/V, & decreased appetite.   Baseline functional status: lives independently, no difficulty with ADL's.   The patient called her PCP on 4/11 for sore throat, chills/sweating, fever to 102, cough with grey mucus production.  She reported her niece was sick and she was exposed. She was started on azithromycin.  The patient did not have any improvement in symptoms prompting ER evaluation on 4/14.  Initial ER evaluation notable for tmax 100.5, WBC 17.3, & CXR with multifocal infiltrates concerning for pneumonia.  The patient was admitted per [facility] for further evaluation of sepsis thought secondary to PNA.  She was treated with azithromycin, cefepime.  Subsequent follow up CT of the chest demonstrated airway thickening / bronchiectasis, right sided multi-lobar pnumonia with pleural effusion.  On comparison, she did not have these findings in 04/2020.  The patient's sputum production improved to nearly clear throughout the course of admission.  PCCM consulted for evaluation of infiltrates.   Pertinent  Medical History  Tobacco Abuse - 120 pk years, quit 2010 Lung Cancer s/p Lobectomy - RLL DM II  HTN   Significant Hospital Events: Including procedures, antibiotic start and stop dates in addition to other pertinent events   . 4/14 Admit  . 4/18 PCCM consulted for evaluation of infiltrates.  CT chest w/o notable for multilobar PNA on right side, pleural effusion    Interim History / Subjective:  Afebrile    Objective   Blood pressure 123/68, pulse 64, temperature 98.4 F (36.9 C), temperature source Oral, resp. rate 16, height 5\' 8"  (1.727 m), weight (P) 76.9 kg, SpO2 98 %.        Intake/Output  Summary (Last 24 hours) at 09/20/2020 1044 Last data filed at 09/20/2020 0815 Gross per 24 hour  Intake 1729.03 ml  Output 2250 ml  Net -520.97 ml   Filed Weights   09/18/20 0343 09/19/20 0015 09/20/20 0001  Weight: 80.8 kg 79.1 kg (P) 76.9 kg    Examination: General: pleasant adult female lying in bed in NAD  HEENT: MM pink/moist, anicteric, wearing glasses  Neuro: AAOx4, speech clear, MAE  CV: s1s2 RRR, no m/r/g PULM: non-labored on 1L [place], lungs bilaterally clear anterior, right basilar fine crackles  GI: soft, bsx4 active  Extremities: warm/dry, no edema  Skin: no rashes or lesions   Resolved Hospital Problem list      Assessment & Plan:   Right Infiltrates, CAP Right Pleural Effusion Mild Bronchiectasis  Hx of lung cancer with R lobectomy in 2016, no hx of radiation.  Former tobacco abuse with 120 pk year smoking, quit 2010.  Recent sick exposure and infectious prodrome consistent with CAP, but must also consider recurrence of malignancy given hx.  COVID / influenza negative.   -consider 7 days duration abx  -pulmonary hygiene - IS, mobilize -add flutter valve for secretion clearance  -guaifenesin BID for secretions -pleural effusion not likely large enough to safely sample  -send urine strep antigen  -will need follow up CT imaging to ensure clearance of infiltrates    Best practice (right click and "Reselect all SmartList Selections" daily)  Per primary  Labs   CBC: Recent Labs  Lab 09/17/20 0244 09/17/20 1114  09/18/20 0201 09/19/20 0254 09/20/20 0343  WBC 14.3* 12.6* 11.3* 11.3* 9.9  HGB 9.7* 10.2* 9.6* 9.3* 10.1*  HCT 29.3* 30.7* 29.9* 28.8* 31.0*  MCV 93.6 93.9 95.2 93.2 94.8  PLT 385 384 365 403* 412*    Basic Metabolic Panel: Recent Labs  Lab 09/17/20 0244 09/17/20 1114 09/18/20 0201 09/19/20 0254 09/20/20 0343  NA 136 137 140 138 139  K 3.8 4.0 3.7 3.6 3.5  CL 106 108 111 104 102  CO2 24 21* 22 26 26   GLUCOSE 147* 158* 170* 126* 115*   BUN 11 13 10 8 10   CREATININE 0.71 0.75 0.64 0.53 0.64  CALCIUM 8.1* 8.3* 8.2* 8.2* 8.6*  MG  --  1.9 2.1 1.8 1.9  PHOS  --  2.2*  --   --   --    GFR: Estimated Creatinine Clearance: 70.2 mL/min (by C-G formula based on SCr of 0.64 mg/dL). Recent Labs  Lab 09/17/20 0829 09/17/20 1114 09/18/20 0201 09/19/20 0254 09/20/20 0343  PROCALCITON 0.17  --   --  <0.10  --   WBC  --  12.6* 11.3* 11.3* 9.9  LATICACIDVEN 0.9 1.1  --   --   --     Liver Function Tests: Recent Labs  Lab 09/16/20 1405 09/17/20 1114 09/18/20 0201 09/19/20 0254 09/20/20 0343  AST 21 27 27 25 30   ALT 28 24 28 28 31   ALKPHOS 114 106 101 105 100  BILITOT 1.0 0.8 0.5 0.4 0.3  PROT 7.3 6.0* 5.6* 5.5* 5.7*  ALBUMIN 2.9* 2.3* 2.2* 2.0* 2.1*   Recent Labs  Lab 09/16/20 1405  LIPASE 22   No results for input(s): AMMONIA in the last 168 hours.  ABG    Component Value Date/Time   PHART 7.373 10/06/2014 0450   PCO2ART 43.8 10/06/2014 0450   PO2ART 95.8 10/06/2014 0450   HCO3 24.9 (H) 10/06/2014 0450   TCO2 26.2 10/06/2014 0450   ACIDBASEDEF 0.3 10/01/2014 1240   O2SAT 97.1 10/06/2014 0450     Coagulation Profile: No results for input(s): INR, PROTIME in the last 168 hours.  Cardiac Enzymes: No results for input(s): CKTOTAL, CKMB, CKMBINDEX, TROPONINI in the last 168 hours.  HbA1C: Hemoglobin A1C  Date/Time Value Ref Range Status  06/30/2020 03:11 PM 8.0 (A) 4.0 - 5.6 % Final   HbA1c, POC (prediabetic range)  Date/Time Value Ref Range Status  12/24/2019 03:26 PM 6.2 5.7 - 6.4 % Final   HbA1c, POC (controlled diabetic range)  Date/Time Value Ref Range Status  12/24/2019 03:26 PM 6.2 0.0 - 7.0 % Final   HbA1c POC (<> result, manual entry)  Date/Time Value Ref Range Status  12/24/2019 03:26 PM 6.2 4.0 - 5.6 % Final   Hgb A1c MFr Bld  Date/Time Value Ref Range Status  09/16/2020 09:29 PM 8.2 (H) 4.8 - 5.6 % Final    Comment:    (NOTE) Pre diabetes:          5.7%-6.4%  Diabetes:               >6.4%  Glycemic control for   <7.0% adults with diabetes   03/29/2020 02:45 PM 8.5 (H) <5.7 % of total Hgb Final    Comment:    For someone without known diabetes, a hemoglobin A1c value of 6.5% or greater indicates that they may have  diabetes and this should be confirmed with a follow-up  test. . For someone with known diabetes, a value <7% indicates  that their  diabetes is well controlled and a value  greater than or equal to 7% indicates suboptimal  control. A1c targets should be individualized based on  duration of diabetes, age, comorbid conditions, and  other considerations. . Currently, no consensus exists regarding use of hemoglobin A1c for diagnosis of diabetes for children. .     CBG: Recent Labs  Lab 09/19/20 0733 09/19/20 1204 09/19/20 1616 09/19/20 2027 09/20/20 0555  GLUCAP 117* 153* 137* 198* 119*    Review of Systems: Positives in [place]  Gen: Denies fever, chills, weight change, fatigue, night sweats HEENT: Denies blurred vision, double vision, hearing loss, tinnitus, sinus congestion, rhinorrhea, sore throat, neck stiffness, dysphagia PULM: Denies shortness of breath, cough, sputum production, hemoptysis, wheezing CV: Denies chest pain, edema, orthopnea, paroxysmal nocturnal dyspnea, palpitations GI: Denies abdominal pain, nausea, vomiting, diarrhea, hematochezia, melena, constipation, change in bowel habits GU: Denies dysuria, hematuria, polyuria, oliguria, urethral discharge Endocrine: Denies hot or cold intolerance, polyuria, polyphagia or appetite change Derm: Denies rash, dry skin, scaling or peeling skin change Heme: Denies easy bruising, bleeding, bleeding gums Neuro: Denies headache, numbness, weakness, slurred speech, loss of memory or consciousness  Past Medical History:  She,  has a past medical history of Anxiety, Arthritis, Borderline diabetic, Colon cancer ([place]), Depression, Depression with anxiety (09/19/2011), H/O colon cancer,  stage II (09/19/2011), High cholesterol, Hyperlipidemia (09/19/2011), Lung nodules (09/19/2011), and Urinary urgency.   Surgical History:   Past Surgical History:  Procedure Laterality Date  . APPENDECTOMY    . AUGMENTATION MAMMAPLASTY    . BREAST SURGERY  1978   augmentation  . COLON SURGERY     2010  . CRYO INTERCOSTAL NERVE BLOCK N/A 10/05/2014   Procedure: CRYO INTERCOSTAL NERVE BLOCK;  Surgeon: [name], MD;  Location: [place];  Service: Thoracic;  Laterality: N/A;  . LYMPH NODE DISSECTION Right 10/05/2014   Procedure: LYMPH NODE DISSECTION;  Surgeon: [name], MD;  Location: [place];  Service: Thoracic;  Laterality: Right;  . ORIF HUMERUS FRACTURE Left 10/01/2018   Procedure: OPEN REDUCTION INTERNAL FIXATION (ORIF) LEFT PROXIMAL HUMERUS FRACTURE;  Surgeon: [name], MD;  Location: [place];  Service: Orthopedics;  Laterality: Left;  . PARTIAL COLECTOMY Right 2010  . SEGMENTECOMY Right 10/05/2014   Procedure: right lower lobe SEGMENTECTOMY;  Surgeon: [name], MD;  Location: [place];  Service: Thoracic;  Laterality: Right;  [name] VIDEO ASSISTED THORACOSCOPY (VATS)/WEDGE RESECTION Right 10/05/2014   Procedure: Right VIDEO ASSISTED THORACOSCOPY (VATS) with right lower lobe lung nodule WEDGE RESECTION;  Surgeon: [name], MD;  Location: [place];  Service: Thoracic;  Laterality: Right;     Social History:   reports that she quit smoking about 11 years ago. Her smoking use included cigarettes. She has a 120.00 pack-year smoking history. She has never used smokeless tobacco. She reports current alcohol use. She reports that she does not use drugs.   Family History:  Her family history includes Colon polyps in her sister; Heart disease in her brother and mother. There is no history of Colon cancer, Esophageal cancer, Stomach cancer, or Rectal cancer.   Allergies Allergies  Allergen Reactions  . Atorvastatin Other (See Comments)    Muscle cramps  .  Glipizide Er Other (See Comments)    Shaking / tingling in fingers      Home Medications  Prior to Admission medications   Medication Sig Start Date End Date Taking? Authorizing Provider  azithromycin (ZITHROMAX) 250 MG tablet 2 tabs po  x1 on Day 1, then 1 tab po daily on Days 2 - 5 Patient taking differently: Take 250 mg by mouth See admin instructions. 2 tabs po x1 on Day 1, then 1 tab po daily on Days 2 - 5 start date :09/13/20 09/13/20  Yes Alexander, Lanelle Bal, DO  B Complex-C (B-COMPLEX WITH VITAMIN C) tablet Take 1 tablet by mouth daily.   Yes [provider]  Cholecalciferol (VITAMIN D) 2000 UNITS tablet Take 2,000 Units by mouth daily.   Yes [provider]  cloNIDine (CATAPRES) 0.1 MG tablet Take 1 tablet (0.1 mg total) by mouth at bedtime. 09/16/19  Yes Emeterio Reeve, DO  FLUoxetine (PROZAC) 40 MG capsule Take 1 capsule (40 mg total) by mouth daily. 09/16/19  Yes Emeterio Reeve, DO  gabapentin (NEURONTIN) 300 MG capsule TAKE 1 CAPSULE (300 MG TOTAL) BY MOUTH AT BEDTIME AS NEEDED (INSOMNIA). 02/18/20  Yes Emeterio Reeve, DO  Insulin Glargine (BASAGLAR KWIKPEN) 100 UNIT/ML 10 UNITS AT BED INCREASE BY 2 UNITS EVERY 3 DAYS TIL FASTING SUGAR GOAL 120-150 MAX 60 UNITS PER DAY Patient taking differently: Inject 35 Units into the skin at bedtime. 05/04/20  Yes Emeterio Reeve, DO  pravastatin (PRAVACHOL) 80 MG tablet Take 1 tablet (80 mg total) by mouth daily. 09/16/19  Yes Emeterio Reeve, DO  albuterol (VENTOLIN HFA) 108 (90 Base) MCG/ACT inhaler Inhale 2 puffs into the lungs every 6 (six) hours as needed for wheezing or shortness of breath. 09/13/20   Emeterio Reeve, DO  AMBULATORY NON FORMULARY MEDICATION Single glucometer with lancets, test strips. Test daily.  Disp qs 3 months E11.9 07/10/18   Gregor Hams, MD  B-D UF III MINI PEN NEEDLES 31G X 5 MM MISC USE AS DIRECTED 01/22/20   Emeterio Reeve, DO  OneTouch Delica Lancets 83G MISC TEST BLOOD SUGARS  DAILY 08/05/20   Emeterio Reeve, DO  [facility] VERIO test strip TEST BLOOD SUGARS DAILY 07/11/19   Emeterio Reeve, DO         Noe Gens, MSN, APRN, NP-C, AGACNP-BC [facility] 09/20/2020, 10:44 AM   Please see [email or web address] for pager details.   From 7A-7P if no response, please call [phone] After hours, please call ELink [phone]

## 2020-09-20 NOTE — Care Management Important Message (Signed)
Important Message  Patient Details  Name: Kelli [name] MRN: [id number] Date of Birth: 09/20/1945   Medicare Important Message Given:  Yes     [name] 09/20/2020, 10:03 AM

## 2020-09-20 NOTE — Plan of Care (Signed)
  Problem: Activity: Goal: Ability to tolerate increased activity will improve Outcome: Progressing   

## 2020-09-20 NOTE — Progress Notes (Signed)
Occupational Therapy Treatment Patient Details Name: Kelli [name] MRN: [id number] DOB: Dec 22, 1945 Today's Date: 09/20/2020    History of present illness 75 year old female admitted for sepsis due to multifocal pneumonia.  PMHx: HTN, DM2, VATS and wedge resection RLL due to stage 1b NSCLC, tobacco use   OT comments  Pt. Seen for skilled OT treatment session.  Going between joking/sarcasm during session to periods of agitation but apologetic and explaining that she is "just tired of everything".  Upon arrival states "im not going to do anything you ask, just because.  There is nothing you can ask me to do that I can not do".  Declines oob but agreeable to LB dressing task eob.  Able to complete LB dressing with set up.  Education provided on energy conservation strategies during LB ADLS.  Bed mobility S.  Reviewed log roll tech. During in/out of bed. Pt. Able to sequence without cues and tolerated well.  Note d/c likely tomorrow.    Follow Up Recommendations  No OT follow up    Equipment Recommendations  None recommended by OT    Recommendations for Other Services      Precautions / Restrictions Precautions Precautions: Fall Precaution Comments: monitor sats Restrictions Weight Bearing Restrictions: No       Mobility Bed Mobility Overal bed mobility: Needs Assistance Bed Mobility: Rolling;Sidelying to Sit;Sit to Sidelying Rolling: Supervision Sidelying to sit: Min guard     Sit to sidelying: Min guard General bed mobility comments: pt. moves slow but able to complete log roll sequence to aide in back pain management, able to scoot towards hob x3 prior to lying down-educated on benefits from scooting towards hob prior to lying down to prevent further adjustments once she has layed down    Transfers                 General transfer comment: patient deferred mobility    Balance                                           ADL either performed or  assessed with clinical judgement   ADL Overall ADL's : Needs assistance/impaired                     Lower Body Dressing: Set up;Sitting/lateral leans Lower Body Dressing Details (indicate cue type and reason): don/doff socks seated eob               General ADL Comments: pt. declined oob, agreeable to eob dressing task. able to perform without assistance.  education provided on energy conservation during LB dressing tasks     Vision       Perception     Praxis      Cognition Arousal/Alertness: Awake/alert Behavior During Therapy: Agitated;WFL for tasks assessed/performed Overall Cognitive Status: Within Functional Limits for tasks assessed                                 General Comments: pt. went between joking and sarcasm to slight agitation but would then be apologetic and resume pleasant banter        Exercises     Shoulder Instructions       General Comments  during some of pts. Joking banter she cont. To say she was "just grumpy".  At  end of session while I was tucking her in I said "okay now back to grumpy town where you can rest".  Pt. Said "why would you say something like that to me what am I 7". I immediately apologized explaining it was a bad joke in reference to her stating she was grumpy and wanting to be left alone and do nothing.  She said  While laughing "what is your name so I can report you, no im just kidding but what is your name".  I provided my name.  She said "seriously im just joking but I did want to know your name, sometimes you just get grumpy from time to time".  I told her it was fine and apologized a 2nd time that I was only trying to make her smile and laugh.  She states "my back hurts too much to laugh, but do not give it another thought I am fine I just was grumpy".  All items left in reach, pt. States she had no other needs and was ready to rest.      Pertinent Vitals/ Pain       Pain Assessment: Faces Faces Pain  Scale: Hurts little more Pain Location: low back, ? sprain/strain after coughing Pain Descriptors / Indicators: Stabbing;Tender;Grimacing Pain Intervention(s): Monitored during session;Repositioned  Home Living                                          Prior Functioning/Environment              Frequency  Min 2X/week        Progress Toward Goals  OT Goals(current goals can now be found in the care plan section)  Progress towards OT goals: Progressing toward goals     Plan      Co-evaluation                 AM-PAC OT "6 Clicks" Daily Activity     Outcome Measure   Help from another person eating meals?: None Help from another person taking care of personal grooming?: None Help from another person toileting, which includes using toliet, bedpan, or urinal?: None Help from another person bathing (including washing, rinsing, drying)?: A Little Help from another person to put on and taking off regular upper body clothing?: None Help from another person to put on and taking off regular lower body clothing?: A Little 6 Click Score: 22    End of Session    OT Visit Diagnosis: Pain;Muscle weakness (generalized) (M62.81)   Activity Tolerance Patient tolerated treatment well   Patient Left in bed;with call bell/phone within reach;with bed alarm set   Nurse Communication          Time: 1165-7903 OT Time Calculation (min): 11 min  Charges: OT General Charges $OT Visit: 1 Visit OT Treatments $Self Care/Home Management : 8-22 mins  Sonia Baller, COTA/L Acute Rehabilitation [phone]  09/20/2020, 11:31 AM

## 2020-09-20 NOTE — Progress Notes (Signed)
PROGRESS NOTE    Kelli [name]  HYI:[id number] DOB: [birth year] DOA: 09/16/2020 PCP: Emeterio Reeve, DO   Brief Narrative:  75 year old with history of HTN, DM2, lung cancer status post lobectomy, history of tobacco use admitted for generalized progressive weakness with malaise nonproductive cough.  As outpatient she was started on azithromycin and albuterol but due to progressive symptoms came to the hospital.  Chest x-ray showed evidence of multifocal pneumonia..  Diagnosed with sepsis secondary to pneumonia started on antibiotics.  Due to concerns of volume overload received Lasix, echocardiogram showed EF 60 to 65%.  Due to persistent fever, antibiotics were broadened to azithromycin and cefepime.  She also reported of back and lower abdominal pain therefore CT abdomen pelvis with contrast was done which was negative.  CT chest without contrast showed multiple lobar pneumonia severe in the right upper and lower lobes with loculated parapneumonic effusion.  Pulmonary team consulted.   Assessment & Plan:   Principal Problem:   Sepsis ([place]) Active Problems:   Hyperlipidemia   Lung cancer, lower lobe (H[facility])   Diabetes mellitus type 2, uncomplicated ([place])   Multifocal pneumonia    Sepsis due to multifocal pneumonia Extensive tobacco use in the past -Initially sepsis secondary to fever, tachycardia, leukocytosis and source.  Low-grade fever overnight.  Did not receive fluids on admission due to volume overload -Sepsis protocol.  Cultures-negative.  Repeat cultures-sent  procalcitonin-negative -Changed to p.o. azithromycin.  Change IV Rocephin to cefepime -Bronchodilators, I-S/flutter -No previous diagnosis of COPD -CT abdomen pelvis with contrast- negative for any acute pathology. - CT chest without contrast- Multi lobar pneumonia severe in the right upper and lower lobe with loculated parapneumonic effusion. - Pulmonary consulted- possible need for bronchoscopy?  Mild urinary  tract infection with mild hematuria  - Urine cultures ordered - Already on cefepime.  Will further adjust as needed  Acute respiratory distress with hypoxia, mild -Patient desaturates down to 86% on RA -Ongoing treatment for community-acquired pneumonia.  Possible mild volume overload.   -Echocardiogram-EF 60 to 65% - Wean off oxygen  Alcohol abuse -Drinks 2-3 beers every other day.  Not showing any signs of withdrawal right now -Multivitamin, thiamine, folate -Alcohol withdrawal protocol  Low back pain - Musculoskeletal.  Heating pad, Flexeril  Hypokalemia -Replete as needed  Essential hypertension -Clonidine 0.1 mg at bedtime  Anxiety and depression -Prozac  Hyperlipidemia -Pravachol  Diabetes mellitus type 2, uncontrolled Peripheral neuropathy secondary to DM2 -A1c-8.2 -Insulin sliding scale and Accu-Chek -Lantus 22 units at bedtime -NovoLog 3 units premeals    DVT prophylaxis: Subcu Lovenox Code Status: Full code Family Communication: Kelli [name] updated periodically  Status is: Inpatient  Remains inpatient appropriate because:Inpatient level of care appropriate due to severity of illness.  Low-grade fevers over the last 24 hours.  Continue broad antibiotic coverage  Dispo: The patient is from: Home              Anticipated d/c is to: Home              Patient currently is not medically stable to d/c.   Difficult to place patient No    Subjective: Patient had low-grade fever yesterday.  This morning with ambulation she slightly felt lightheaded and her systolic blood pressure was in 80s but after sitting down and upon recheck her blood pressure improved to MAP of 81.  Tolerating orals okay.  Review of Systems Otherwise negative except as per HPI, including: General: Denies fever, chills, night sweats or unintended weight loss.  Resp: Denies wheezing Cardiac: Denies chest pain, palpitations, orthopnea, paroxysmal nocturnal dyspnea. GI: Denies  abdominal pain, nausea, vomiting, diarrhea or constipation GU: Denies dysuria, frequency, hesitancy or incontinence MS: Denies muscle aches, joint pain or swelling Neuro: Denies headache, neurologic deficits (focal weakness, numbness, tingling), abnormal gait Psych: Denies anxiety, depression, SI/HI/AVH Skin: Denies new rashes or lesions ID: Denies sick contacts, exotic exposures, travel  Examination: Constitutional: Not in acute distress Respiratory: Right-sided rhonchorous lungs. Cardiovascular: Normal sinus rhythm, no rubs Abdomen: Nontender nondistended good bowel sounds Musculoskeletal: No edema noted Skin: No rashes seen Neurologic: CN 2-12 grossly intact.  And nonfocal Psychiatric: Normal judgment and insight. Alert and oriented x 3. Normal mood.  Objective: Vitals:   09/19/20 2014 09/19/20 2029 09/20/20 0001 09/20/20 0554  BP: (!) 134/56   123/68  Pulse: 76   64  Resp: 20   16  Temp: 98.8 F (37.1 C)   98.4 F (36.9 C)  TempSrc: Oral   Oral  SpO2: 93% 93%  93%  Weight:   (P) 76.9 kg   Height:        Intake/Output Summary (Last 24 hours) at 09/20/2020 0729 Last data filed at 09/20/2020 0300 Gross per 24 hour  Intake 1969.03 ml  Output 2550 ml  Net -580.97 ml   Filed Weights   09/18/20 0343 09/19/20 0015 09/20/20 0001  Weight: 80.8 kg 79.1 kg (P) 76.9 kg     Data Reviewed:   CBC: Recent Labs  Lab 09/17/20 0244 09/17/20 1114 09/18/20 0201 09/19/20 0254 09/20/20 0343  WBC 14.3* 12.6* 11.3* 11.3* 9.9  HGB 9.7* 10.2* 9.6* 9.3* 10.1*  HCT 29.3* 30.7* 29.9* 28.8* 31.0*  MCV 93.6 93.9 95.2 93.2 94.8  PLT 385 384 365 403* 161*   Basic Metabolic Panel: Recent Labs  Lab 09/17/20 0244 09/17/20 1114 09/18/20 0201 09/19/20 0254 09/20/20 0343  NA 136 137 140 138 139  K 3.8 4.0 3.7 3.6 3.5  CL 106 108 111 104 102  CO2 24 21* 22 26 26   GLUCOSE 147* 158* 170* 126* 115*  BUN 11 13 10 8 10   CREATININE 0.71 0.75 0.64 0.53 0.64  CALCIUM 8.1* 8.3* 8.2* 8.2*  8.6*  MG  --  1.9 2.1 1.8 1.9  PHOS  --  2.2*  --   --   --    GFR: Estimated Creatinine Clearance: 70.2 mL/min (by C-G formula based on SCr of 0.64 mg/dL). Liver Function Tests: Recent Labs  Lab 09/16/20 1405 09/17/20 1114 09/18/20 0201 09/19/20 0254 09/20/20 0343  AST 21 27 27 25 30   ALT 28 24 28 28 31   ALKPHOS 114 106 101 105 100  BILITOT 1.0 0.8 0.5 0.4 0.3  PROT 7.3 6.0* 5.6* 5.5* 5.7*  ALBUMIN 2.9* 2.3* 2.2* 2.0* 2.1*   Recent Labs  Lab 09/16/20 1405  LIPASE 22   No results for input(s): AMMONIA in the last 168 hours. Coagulation Profile: No results for input(s): INR, PROTIME in the last 168 hours. Cardiac Enzymes: No results for input(s): CKTOTAL, CKMB, CKMBINDEX, TROPONINI in the last 168 hours. BNP (last 3 results) No results for input(s): PROBNP in the last 8760 hours. HbA1C: No results for input(s): HGBA1C in the last 72 hours. CBG: Recent Labs  Lab 09/19/20 0733 09/19/20 1204 09/19/20 1616 09/19/20 2027 09/20/20 0555  GLUCAP 117* 153* 137* 198* 119*   Lipid Profile: No results for input(s): CHOL, HDL, LDLCALC, TRIG, CHOLHDL, LDLDIRECT in the last 72 hours. Thyroid Function Tests: No results for input(s): TSH,  T4TOTAL, FREET4, T3FREE, THYROIDAB in the last 72 hours. Anemia Panel: No results for input(s): VITAMINB12, FOLATE, FERRITIN, TIBC, IRON, RETICCTPCT in the last 72 hours. Sepsis Labs: Recent Labs  Lab 09/17/20 0829 09/17/20 1114 09/19/20 0254  PROCALCITON 0.17  --  <0.10  LATICACIDVEN 0.9 1.1  --     Recent Results (from the past 240 hour(s))  Resp Panel by RT-PCR (Flu A&B, Covid) Nasopharyngeal Swab     Status: None   Collection Time: 09/16/20  2:57 PM   Specimen: Nasopharyngeal Swab; Nasopharyngeal(NP) swabs in vial transport medium  Result Value Ref Range Status   SARS Coronavirus 2 by RT PCR NEGATIVE NEGATIVE Final    Comment: (NOTE) SARS-CoV-2 target nucleic acids are NOT DETECTED.  The SARS-CoV-2 RNA is generally detectable  in upper respiratory specimens during the acute phase of infection. The lowest concentration of SARS-CoV-2 viral copies this assay can detect is 138 copies/mL. A negative result does not preclude SARS-Cov-2 infection and should not be used as the sole basis for treatment or other patient management decisions. A negative result may occur with  improper specimen collection/handling, submission of specimen other than nasopharyngeal swab, presence of viral mutation(s) within the areas targeted by this assay, and inadequate number of viral copies(<138 copies/mL). A negative result must be combined with clinical observations, patient history, and epidemiological information. The expected result is Negative.  Fact Sheet for Patients:  EntrepreneurPulse.com.au  Fact Sheet for Healthcare Providers:  IncredibleEmployment.be  This test is no t yet approved or cleared by the [place] FDA and  has been authorized for detection and/or diagnosis of SARS-CoV-2 by FDA under an Emergency Use Authorization (EUA). This EUA will remain  in effect (meaning this test can be used) for the duration of the COVID-19 declaration under Section 564(b)(1) of the Act, 21 U.S.C.section 360bbb-3(b)(1), unless the authorization is terminated  or revoked sooner.       Influenza A by PCR NEGATIVE NEGATIVE Final   Influenza B by PCR NEGATIVE NEGATIVE Final    Comment: (NOTE) The Xpert Xpress SARS-CoV-2/FLU/RSV plus assay is intended as an aid in the diagnosis of influenza from Nasopharyngeal swab specimens and should not be used as a sole basis for treatment. Nasal washings and aspirates are unacceptable for Xpert Xpress SARS-CoV-2/FLU/RSV testing.  Fact Sheet for Patients: EntrepreneurPulse.com.au  Fact Sheet for Healthcare Providers: IncredibleEmployment.be  This test is not yet approved or cleared by the [place] FDA and has  been authorized for detection and/or diagnosis of SARS-CoV-2 by FDA under an Emergency Use Authorization (EUA). This EUA will remain in effect (meaning this test can be used) for the duration of the COVID-19 declaration under Section 564(b)(1) of the Act, 21 U.S.C. section 360bbb-3(b)(1), unless the authorization is terminated or revoked.  Performed at [facility], [place] [address], [place], [place] [address]   Culture, blood (routine x 2)     Status: None (Preliminary result)   Collection Time: 09/16/20  5:00 PM   Specimen: BLOOD  Result Value Ref Range Status   Specimen Description BLOOD SITE NOT SPECIFIED  Final   Special Requests   Final    BOTTLES DRAWN AEROBIC AND ANAEROBIC Blood Culture results may not be optimal due to an excessive volume of blood received in culture bottles   Culture   Final    NO GROWTH 4 DAYS Performed at [facility], [place] [address], [place], [place] [address]    Report Status PENDING  Incomplete  Culture,  blood (routine x 2)     Status: None (Preliminary result)   Collection Time: 09/16/20  5:07 PM   Specimen: BLOOD  Result Value Ref Range Status   Specimen Description BLOOD LEFT ANTECUBITAL  Final   Special Requests   Final    BOTTLES DRAWN AEROBIC AND ANAEROBIC Blood Culture adequate volume   Culture   Final    NO GROWTH 4 DAYS Performed at [facility], [address], [place], [place] [address]    Report Status PENDING  Incomplete  Expectorated Sputum Assessment w Gram Stain, Rflx to Resp Cult     Status: None   Collection Time: 09/19/20 11:37 AM   Specimen: Expectorated Sputum  Result Value Ref Range Status   Specimen Description EXPECTORATED SPUTUM  Final   Special Requests Normal  Final   Sputum evaluation   Final    Sputum specimen not acceptable for testing.  Please recollect.   Gram Stain Report Called to,Read Back By and Verified With: RN A ROBERTS AT 2028 09/19/2020 BY L BENFIELD Performed at [facility],  [place] [address] [place], [place] [address]    Report Status 09/19/2020 FINAL  Final  Culture, blood (Routine X 2) w Reflex to ID Panel     Status: None (Preliminary result)   Collection Time: 09/19/20 12:12 PM   Specimen: BLOOD  Result Value Ref Range Status   Specimen Description BLOOD RIGHT ANTECUBITAL  Final   Special Requests   Final    BOTTLES DRAWN AEROBIC AND ANAEROBIC Blood Culture results may not be optimal due to an inadequate volume of blood received in culture bottles   Culture   Final    NO GROWTH < 24 HOURS Performed at [facility], [place] [address], [place], [place] [address]    Report Status PENDING  Incomplete  Culture, blood (Routine X 2) w Reflex to ID Panel     Status: None (Preliminary result)   Collection Time: 09/19/20 12:12 PM   Specimen: BLOOD RIGHT HAND  Result Value Ref Range Status   Specimen Description BLOOD RIGHT HAND  Final   Special Requests   Final    BOTTLES DRAWN AEROBIC ONLY Blood Culture results may not be optimal due to an inadequate volume of blood received in culture bottles   Culture   Final    NO GROWTH < 24 HOURS Performed at [facility], [place] [address], [place], [place] [address]    Report Status PENDING  Incomplete         Radiology Studies: CT ABDOMEN PELVIS W CONTRAST  Result Date: 09/19/2020 CLINICAL DATA:  Abdominal pain and fevers EXAM: CT ABDOMEN AND PELVIS WITH CONTRAST TECHNIQUE: Multidetector CT imaging of the abdomen and pelvis was performed using the standard protocol following bolus administration of intravenous contrast. CONTRAST:  146mL OMNIPAQUE IOHEXOL 300 MG/ML  SOLN COMPARISON:  03/12/2012 FINDINGS: Lower chest: Left lung base is clear with the exception of a tiny subpleural nodule. Patchy infiltrate is noted in the right lower lobe which is new from a prior exam from 04/09/2020 consistent with acute pneumonia. Small associated right-sided effusion is seen. Pleural calcifications are noted slightly increased  when compared with the prior exam. Bilateral breast implants are noted. Hepatobiliary: No focal liver abnormality is seen. No gallstones, gallbladder wall thickening, or biliary dilatation. Pancreas: Unremarkable. No pancreatic ductal dilatation or surrounding inflammatory changes. Spleen: Normal in size without focal abnormality. Adrenals/Urinary Tract: Adrenal glands are within normal limits.  Kidneys demonstrate a normal enhancement pattern bilaterally. No obstructive changes are seen. No renal calculi are noted. Ureters are within normal limits bilaterally. Bladder is partially distended. Stomach/Bowel: No obstructive or inflammatory changes of the colon are seen. The appendix is not well visualized consistent with the given clinical history. Small bowel is within normal limits. Stomach is decompressed. Vascular/Lymphatic: Aortic atherosclerosis. No enlarged abdominal or pelvic lymph nodes. Reproductive: Uterus and bilateral adnexa are unremarkable. Other: No abdominal wall hernia or abnormality. No abdominopelvic ascites. Likely sebaceous cyst is noted in the medial aspect of the left breast inferiorly adjacent to the implant best seen on image number 14 of series 3. This has increased in size now measuring 3.3 cm previously measuring 2.3 cm. Musculoskeletal: No acute or significant osseous findings. IMPRESSION: Patchy right basilar infiltrate with associated effusion consistent with pneumonia. This is similar to that seen on prior plain film examination from 09/16/20 Pleural calcification on the right slightly increased from prior CT examination. Likely sebaceous cyst in the left breast slightly larger than that seen on the prior exam. The need for further follow-up can be determined on a clinical basis. Electronically Signed   By: Inez Catalina M.D.   On: 09/19/2020 15:30   ECHOCARDIOGRAM COMPLETE  Result Date: 09/18/2020    ECHOCARDIOGRAM REPORT   Patient Name:   Kelli [name] Date of Exam: 09/18/2020 Medical  Rec #:  [id number]    Height:       68.0 in Accession #:    [id number]   Weight:       178.1 lb Date of Birth:  [birth year]    BSA:          1.946 m Patient Age:    54 years     BP:           126/50 mmHg Patient Gender: F            HR:           82 bpm. Exam Location:  Inpatient Procedure: 2D Echo, Cardiac Doppler and Color Doppler Indications:    R07.9* Chest pain, unspecified  Sonographer:    Merrie Roof RDCS Referring Phys: [id number] Perlie Stene CHIRAG Hillarie Harrigan IMPRESSIONS  1. Left ventricular ejection fraction, by estimation, is 60 to 65%. The left ventricle has normal function. The left ventricle has no regional wall motion abnormalities. There is mild left ventricular hypertrophy. Left ventricular diastolic parameters are indeterminate.  2. Right ventricular systolic function is normal. The right ventricular size is normal.  3. The mitral valve is normal in structure. Trivial mitral valve regurgitation.  4. The aortic valve is normal in structure. Aortic valve regurgitation is not visualized.  5. The inferior vena cava is dilated in size with >50% respiratory variability, suggesting right atrial pressure of 8 mmHg. FINDINGS  Left Ventricle: Left ventricular ejection fraction, by estimation, is 60 to 65%. The left ventricle has normal function. The left ventricle has no regional wall motion abnormalities. The left ventricular internal cavity size was normal in size. There is  mild left ventricular hypertrophy. Left ventricular diastolic parameters are indeterminate. Right Ventricle: The right ventricular size is normal. Right vetricular wall thickness was not assessed. Right ventricular systolic function is normal. Left Atrium: Left atrial size was normal in size. Right Atrium: Right atrial size was normal in size. Pericardium: There is no evidence of pericardial effusion. Mitral Valve: The mitral valve is normal in structure. Mild mitral annular calcification. Trivial mitral valve regurgitation. Tricuspid Valve: The  tricuspid valve  is normal in structure. Tricuspid valve regurgitation is trivial. Aortic Valve: The aortic valve is normal in structure. Aortic valve regurgitation is not visualized. Aortic valve mean gradient measures 5.0 mmHg. Aortic valve peak gradient measures 8.9 mmHg. Aortic valve area, by VTI measures 2.24 cm. Pulmonic Valve: The pulmonic valve was not well visualized. Pulmonic valve regurgitation is not visualized. No evidence of pulmonic stenosis. Aorta: The aortic root is normal in size and structure. Venous: The inferior vena cava is dilated in size with greater than 50% respiratory variability, suggesting right atrial pressure of 8 mmHg. IAS/Shunts: No atrial level shunt detected by color flow Doppler.  LEFT VENTRICLE PLAX 2D LVIDd:         4.50 cm  Diastology LVIDs:         3.10 cm  LV e' medial:    6.31 cm/s LV PW:         1.20 cm  LV E/e' medial:  14.5 LV IVS:        1.10 cm  LV e' lateral:   8.05 cm/s LVOT diam:     2.00 cm  LV E/e' lateral: 11.3 LV SV:         67 LV SV Index:   35 LVOT Area:     3.14 cm  RIGHT VENTRICLE          IVC RV Basal diam:  3.50 cm  IVC diam: 2.10 cm LEFT ATRIUM             Index       RIGHT ATRIUM           Index LA diam:        5.00 cm 2.57 cm/m  RA Area:     14.60 cm LA Vol (A2C):   52.5 ml 26.98 ml/m RA Volume:   37.50 ml  19.27 ml/m LA Vol (A4C):   67.0 ml 34.44 ml/m LA Biplane Vol: 64.3 ml 33.05 ml/m  AORTIC VALVE AV Area (Vmax):    2.45 cm AV Area (Vmean):   2.33 cm AV Area (VTI):     2.24 cm AV Vmax:           149.00 cm/s AV Vmean:          102.000 cm/s AV VTI:            0.300 m AV Peak Grad:      8.9 mmHg AV Mean Grad:      5.0 mmHg LVOT Vmax:         116.00 cm/s LVOT Vmean:        75.500 cm/s LVOT VTI:          0.214 m LVOT/AV VTI ratio: 0.71  AORTA Ao Root diam: 3.30 cm MITRAL VALVE MV Area (PHT): 4.96 cm     SHUNTS MV Decel Time: 153 msec     Systemic VTI:  0.21 m MV E velocity: 91.30 cm/s   Systemic Diam: 2.00 cm MV A velocity: 106.00 cm/s MV E/A  ratio:  0.86 Dorris Carnes MD Electronically signed by Dorris Carnes MD Signature Date/Time: 09/18/2020/4:57:50 PM    Final         Scheduled Meds: . azithromycin  500 mg Oral Daily  . budesonide (PULMICORT) nebulizer solution  0.5 mg Nebulization BID  . cloNIDine  0.1 mg Oral QHS  . enoxaparin (LOVENOX) injection  40 mg Subcutaneous Q24H  . FLUoxetine  40 mg Oral Daily  . folic acid  1 mg Oral Daily  .  insulin aspart  0-15 Units Subcutaneous TID WC  . insulin aspart  0-5 Units Subcutaneous QHS  . insulin aspart  3 Units Subcutaneous TID WC  . insulin glargine  22 Units Subcutaneous QHS  . lidocaine  1 patch Transdermal Q24H  . multivitamin with minerals  1 tablet Oral Daily  . potassium chloride  40 mEq Oral Once  . pravastatin  80 mg Oral Daily  . thiamine  100 mg Oral Daily   Or  . thiamine  100 mg Intravenous Daily   Continuous Infusions: . ceFEPime (MAXIPIME) IV 2 g (09/20/20 0017)     LOS: 4 days   Time spent= 35 mins    Elim Economou Arsenio Loader, MD [facility]  If 7PM-7AM, please contact night-coverage  09/20/2020, 7:29 AM

## 2020-09-20 NOTE — Progress Notes (Signed)
Physical Therapy Treatment Patient Details Name: Kelli [name] MRN: [id number] DOB: 31-Jan-1946 Today's Date: 09/20/2020    History of Present Illness Pt is a 75 y.o. female admitted 09/16/20 with cough, fevers, nausea/vomiting. CXR with multifocal infiltrates concerning for PNA. Workup for sepsis secondary to suspected PNA. Chest CT with bronchiectasis, R-side multi-lobar PNA with pleural effusion. PMH includes lung CA s/p RLL lobectomy, tobacco use (quit 2010), DM2, HTN.   PT Comments    Pt progressing with mobility; pt reports limited secondary to fatigue and back pain. Pt tolerated short bout of in-room activity with intermittent min guard for balance. SpO2 85-94% on RA with activity; 1L O2 [place] replaced at end of session. Pt with productive cough and DOE 2-3/4 with activity. Will continue to follow acutely.  Post-ambulation BP 82/55 2-min seated rest BP 110/67    Follow Up Recommendations  Home health PT (may not need)     Equipment Recommendations  None recommended by PT    Recommendations for Other Services       Precautions / Restrictions Precautions Precautions: Fall;Other (comment) Precaution Comments: Watch SpO2 - does not wear baseline O2; hypotension with activity 4/18 (did not check orthostatics) Restrictions Weight Bearing Restrictions: No    Mobility  Bed Mobility Overal bed mobility: Modified Independent Bed Mobility: Rolling;Sidelying to Sit;Sit to Sidelying Rolling: Supervision Sidelying to sit: Min guard     Sit to sidelying: Min guard General bed mobility comments: Pt reports awareness of log roll technqiue; mod indep with this    Transfers Overall transfer level: Needs assistance Equipment used: None Transfers: Sit to/from Stand Sit to Stand: Min guard         General transfer comment: 2x sit<>stands from EOB with min guard for balance  Ambulation/Gait Ambulation/Gait assistance: Min guard Gait Distance (Feet): 80 Feet Assistive device:  None Gait Pattern/deviations: Step-through pattern;Decreased stride length Gait velocity: Decreased   General Gait Details: Slow, mostly steady gait without DME, intermittent min guard for balance; pt declined hallway ambulation, agreeable to ambulate laps in room; pt with c/o dizziness requiring seated rest break with BP down to 82/55   Stairs             Wheelchair Mobility    Modified Rankin (Stroke Patients Only)       Balance Overall balance assessment: Needs assistance Sitting-balance support: No upper extremity supported;Feet supported Sitting balance-Leahy Scale: Good     Standing balance support: No upper extremity supported Standing balance-Leahy Scale: Good                              Cognition Arousal/Alertness: Awake/alert Behavior During Therapy: WFL for tasks assessed/performed Overall Cognitive Status: Within Functional Limits for tasks assessed                                 General Comments: WFL for simple tasks, not formally assessed      Exercises      General Comments General comments (skin integrity, edema, etc.): SpO2 85-94% on RA with acitivity, HR 77; 1L O2 [place] replaced at end of session. Post-ambulation BP 88/55, up to 110/67 with ~2-min seated rest      Pertinent Vitals/Pain Pain Assessment: Faces Faces Pain Scale: Hurts little more Pain Location: Lower back Pain Descriptors / Indicators: Sharp;Guarding Pain Intervention(s): Monitored during session    Home Living  Prior Function            PT Goals (current goals can now be found in the care plan section) Progress towards PT goals: Progressing toward goals    Frequency    Min 3X/week      PT Plan Current plan remains appropriate    Co-evaluation              AM-PAC PT "6 Clicks" Mobility   Outcome Measure  Help needed turning from your back to your side while in a flat bed without using bedrails?:  None Help needed moving from lying on your back to sitting on the side of a flat bed without using bedrails?: None Help needed moving to and from a bed to a chair (including a wheelchair)?: A Little Help needed standing up from a chair using your arms (e.g., wheelchair or bedside chair)?: A Little Help needed to walk in hospital room?: A Little Help needed climbing 3-5 steps with a railing? : A Little 6 Click Score: 20    End of Session Equipment Utilized During Treatment: Gait belt Activity Tolerance: Patient tolerated treatment well;Patient limited by fatigue Patient left: in bed;with call bell/phone within reach;with bed alarm set Nurse Communication: Mobility status PT Visit Diagnosis: Difficulty in walking, not elsewhere classified (R26.2);Muscle weakness (generalized) (M62.81)     Time: 7681-1572 PT Time Calculation (min) (ACUTE ONLY): 20 min  Charges:  $Therapeutic Activity: 8-22 mins                     Mabeline Caras, PT, DPT Acute Rehabilitation Services  Pager [phone] Office [phone]  Derry Lory 09/20/2020, 12:33 PM

## 2020-09-20 NOTE — Plan of Care (Signed)
?  Problem: Clinical Measurements: ?Goal: Ability to maintain a body temperature in the normal range will improve ?Outcome: Progressing ?  ?Problem: Respiratory: ?Goal: Ability to maintain adequate ventilation will improve ?Outcome: Progressing ?  ?

## 2020-09-20 NOTE — Progress Notes (Signed)
PT Cancellation Note  Patient Details Name: Kelli [name] MRN: [id number] DOB: 1946-02-11   Cancelled Treatment:    Reason Eval/Treat Not Completed: Patient at procedure or test/unavailable. Will follow-up for PT treatment as schedule permits.  Mabeline Caras, PT, DPT Acute Rehabilitation Services  Pager [phone] Office [place] 09/20/2020, 9:18 AM

## 2020-09-21 ENCOUNTER — Other Ambulatory Visit (HOSPITAL_COMMUNITY): Payer: Self-pay

## 2020-09-21 LAB — CBC
HCT: 34.3 % — ABNORMAL LOW (ref 36.0–46.0)
Hemoglobin: 11.4 g/dL — ABNORMAL LOW (ref 12.0–15.0)
MCH: 31.3 pg (ref 26.0–34.0)
MCHC: 33.2 g/dL (ref 30.0–36.0)
MCV: 94.2 fL (ref 80.0–100.0)
Platelets: 482 10*3/uL — ABNORMAL HIGH (ref 150–400)
RBC: 3.64 MIL/uL — ABNORMAL LOW (ref 3.87–5.11)
RDW: 13.6 % (ref 11.5–15.5)
WBC: 12.1 10*3/uL — ABNORMAL HIGH (ref 4.0–10.5)
nRBC: 0 % (ref 0.0–0.2)

## 2020-09-21 LAB — GLUCOSE, CAPILLARY
Glucose-Capillary: 113 mg/dL — ABNORMAL HIGH (ref 70–99)
Glucose-Capillary: 181 mg/dL — ABNORMAL HIGH (ref 70–99)

## 2020-09-21 LAB — CULTURE, BLOOD (ROUTINE X 2)
Culture: NO GROWTH
Culture: NO GROWTH
Special Requests: ADEQUATE

## 2020-09-21 LAB — STREP PNEUMONIAE URINARY ANTIGEN: Strep Pneumo Urinary Antigen: NEGATIVE

## 2020-09-21 LAB — MAGNESIUM: Magnesium: 2.1 mg/dL (ref 1.7–2.4)

## 2020-09-21 MED ORDER — ALBUTEROL SULFATE HFA 108 (90 BASE) MCG/ACT IN AERS
2.0000 | INHALATION_SPRAY | Freq: Four times a day (QID) | RESPIRATORY_TRACT | 1 refills | Status: DC | PRN
  Filled 2020-09-21: qty 18, 25d supply, fill #0

## 2020-09-21 MED ORDER — CYCLOBENZAPRINE HCL 5 MG PO TABS
5.0000 mg | ORAL_TABLET | Freq: Three times a day (TID) | ORAL | 0 refills | Status: DC | PRN
Start: 2020-09-21 — End: 2020-09-29
  Filled 2020-09-21: qty 30, 10d supply, fill #0

## 2020-09-21 MED ORDER — AMOXICILLIN-POT CLAVULANATE 875-125 MG PO TABS
1.0000 | ORAL_TABLET | Freq: Two times a day (BID) | ORAL | 0 refills | Status: DC
  Filled 2020-09-21: qty 20, 10d supply, fill #0

## 2020-09-21 MED ORDER — GUAIFENESIN ER 600 MG PO TB12: 1200.0000 mg | ORAL_TABLET | Freq: Two times a day (BID) | ORAL | Status: DC

## 2020-09-21 NOTE — Progress Notes (Signed)
D/C instructions given and reviewed. IV removed, tolerated well. Awaiting TOC meds.

## 2020-09-21 NOTE — TOC Transition Note (Signed)
Transition of Care [facility]) - CM/SW Discharge Note   Patient Details  Name: Kelli [name] MRN: [id number] Date of Birth: 09/30/1945  [facility] CM/SW Contact:  Zenon Mayo, RN Phone Number: 09/21/2020, 9:29 AM   Clinical Narrative:    Patient is for dc today, she is set up with [facility]. She has no other needs.   Final next level of care: [place] Barriers to Discharge: No Barriers Identified   Patient Goals and CMS Choice Patient states their goals for this hospitalization and ongoing recovery are:: return home CMS Medicare.gov Compare Post Acute Care list provided to:: Patient Choice offered to / list presented to : Patient  Discharge Placement                       Discharge Plan and Services                  DME Agency: NA       HH Arranged: PT HH Agency:  ([facility]) Date HH Agency Contacted: 09/21/20 Time [place]: [phone] Representative spoke with at [place]: [place] ([place]) Interventions     Readmission Risk Interventions No flowsheet data found.

## 2020-09-21 NOTE — Progress Notes (Signed)
Physical Therapy Treatment Patient Details Name: Kelli [name] MRN: [id number] DOB: 1946/05/11 Today's Date: 09/21/2020    History of Present Illness Pt is a 75 y.o. female admitted 09/16/20 with cough, fevers, nausea/vomiting. CXR with multifocal infiltrates concerning for PNA. Workup for sepsis secondary to suspected PNA. Chest CT with bronchiectasis, R-side multi-lobar PNA with pleural effusion. PMH includes lung CA s/p RLL lobectomy, tobacco use (quit 2010), DM2, HTN.   PT Comments    Pt progressing with mobility. Tolerated increased activity without DME, supervision for safety. SpO2 >/90% on RA with ambulation (see saturations qualifications notes); noted improvements in [place]. Pt preparing for d/c home today. Reviewed educ re: activity recommendations, pursed lip breathing, O2 needs. Pt reports no further questions or concerns. If to remain admitted, will continue to follow acutely.   Follow Up Recommendations  Home health PT     Equipment Recommendations  None recommended by PT    Recommendations for Other Services       Precautions / Restrictions      Mobility  Bed Mobility Overal bed mobility: Modified Independent Bed Mobility: Supine to Sit;Sit to Supine                Transfers Overall transfer level: Needs assistance Equipment used: 1 person hand held assist;None Transfers: Sit to/from Stand Sit to Stand: Min assist;Modified independent (Device/Increase time)         General transfer comment: Pt with preference for HHA with minA to elevate/lower trunk at EOB due to back pain; mod indep sit<>stand from toilet without assist  Ambulation/Gait Ambulation/Gait assistance: Supervision Gait Distance (Feet): 200 Feet Assistive device: None Gait Pattern/deviations: Step-through pattern;Decreased stride length Gait velocity: Decreased   General Gait Details: Slow, mostly steady gait without DME, supervision for safety; 1x self-corrected instability; SpO2 >/90%  on RA, DOE up to 3/4 with cues for pursed lip breathing   Stairs             Wheelchair Mobility    Modified Rankin (Stroke Patients Only)       Balance Overall balance assessment: Mild deficits observed, not formally tested Sitting-balance support: No upper extremity supported;Feet supported Sitting balance-Leahy Scale: Good     Standing balance support: No upper extremity supported Standing balance-Leahy Scale: Good                              Cognition Arousal/Alertness: Awake/alert Behavior During Therapy: WFL for tasks assessed/performed Overall Cognitive Status: Within Functional Limits for tasks assessed                                 General Comments: WFL for simple tasks, not formally assessed      Exercises      General Comments        Pertinent Vitals/Pain Pain Assessment: Faces Faces Pain Scale: Hurts little more Pain Location: Lower back Pain Descriptors / Indicators: Sharp;Guarding Pain Intervention(s): Monitored during session    Home Living                      Prior Function            PT Goals (current goals can now be found in the care plan section) Progress towards PT goals: Progressing toward goals    Frequency    Min 3X/week      PT Plan Current plan  remains appropriate    Co-evaluation              AM-PAC PT "6 Clicks" Mobility   Outcome Measure  Help needed turning from your back to your side while in a flat bed without using bedrails?: None Help needed moving from lying on your back to sitting on the side of a flat bed without using bedrails?: None Help needed moving to and from a bed to a chair (including a wheelchair)?: A Little Help needed standing up from a chair using your arms (e.g., wheelchair or bedside chair)?: A Little Help needed to walk in hospital room?: A Little Help needed climbing 3-5 steps with a railing? : A Little 6 Click Score: 20    End of  Session Equipment Utilized During Treatment: Gait belt Activity Tolerance: Patient tolerated treatment well Patient left: in bed;with call bell/phone within reach;with bed alarm set Nurse Communication: Mobility status PT Visit Diagnosis: Difficulty in walking, not elsewhere classified (R26.2);Muscle weakness (generalized) (M62.81)     Time: 9628-3662 PT Time Calculation (min) (ACUTE ONLY): 21 min  Charges:  $Gait Training: 8-22 mins                     Mabeline Caras, PT, DPT Acute Rehabilitation Services  Pager [phone] Office [place] 09/21/2020, 11:24 AM

## 2020-09-21 NOTE — Plan of Care (Signed)
  Problem: Activity: Goal: Ability to tolerate increased activity will improve Outcome: Adequate for Discharge   Problem: Clinical Measurements: Goal: Ability to maintain a body temperature in the normal range will improve Outcome: Adequate for Discharge   Problem: Respiratory: Goal: Ability to maintain adequate ventilation will improve Outcome: Adequate for Discharge Goal: Ability to maintain a clear airway will improve Outcome: Adequate for Discharge   Problem: Increased Nutrient Needs (NI-5.1) Goal: Food and/or nutrient delivery Description: Individualized approach for food/nutrient provision. Outcome: Adequate for Discharge   Problem: Acute Rehab PT Goals(only PT should resolve) Goal: Pt Will Go Supine/Side To Sit Outcome: Adequate for Discharge Goal: Patient Will Transfer Sit To/From Stand Outcome: Adequate for Discharge Goal: Pt Will Ambulate Outcome: Adequate for Discharge Goal: Pt/caregiver will Perform Home Exercise Program Outcome: Adequate for Discharge

## 2020-09-21 NOTE — Discharge Summary (Signed)
Physician Discharge Summary  Kelli [name] QIO:[id number] DOB: 02-Feb-1946 DOA: 09/16/2020  PCP: Emeterio Reeve, DO  Admit date: 09/16/2020 Discharge date: 09/21/2020  Admitted From: Home Disposition:  Home  Recommendations for Outpatient Follow-up:  1. Follow up with PCP in 1-2 weeks 2. Please obtain BMP/CBC in one week your next doctors visit.  3. PO Augmentin for 10 days 4. Outpatient follow up with Pulmonary arranged.  5. Follow up outpatient Oncology in next 2-4 weeks   Discharge Condition: Stable CODE STATUS: Full  Diet recommendation: Diabetic  Brief/Interim Summary: 75 year old with history of HTN, DM2, lung cancer status post lobectomy, history of tobacco use admitted for generalized progressive weakness with malaise nonproductive cough.  As outpatient she was started on azithromycin and albuterol but due to progressive symptoms came to the hospital.  Chest x-ray showed evidence of multifocal pneumonia..  Diagnosed with sepsis secondary to pneumonia started on antibiotics.  Due to concerns of volume overload received Lasix, echocardiogram showed EF 60 to 65%.  Due to persistent fever, antibiotics were broadened to azithromycin and cefepime.  She also reported of back and lower abdominal pain therefore CT abdomen pelvis with contrast was done which was negative.  CT chest without contrast showed multiple lobar pneumonia severe in the right upper and lower lobes with loculated parapneumonic effusion.  Pulmonary team consulted-who recommended Augmentin for 10 days and outpatient follow-up. Patient was doing well and remained afebrile for about 36 hours.  She was ambulating without evidence of hypoxia.  Medically stable to be discharged today.   Assessment & Plan:   Principal Problem:   Sepsis ([place]) Active Problems:   Hyperlipidemia   Lung cancer, lower lobe (HCC)   Diabetes mellitus type 2, uncomplicated ([place])   Multifocal pneumonia    Sepsis due to multifocal  pneumonia Extensive tobacco use in the past -Sepsis physiology currently has resolved.  She remains afebrile. -Sepsis protocol.  Cultures-negative.  Repeat cultures-sent  procalcitonin-negative -Transition antibiotics to oral Augmentin for 10 days. -Bronchodilators, I-S/flutter -No previous diagnosis of COPD -CT abdomen pelvis with contrast- negative for any acute pathology. - CT chest without contrast- Multi lobar pneumonia severe in the right upper and lower lobe with loculated parapneumonic effusion. - Pulmonary consulted- outpatient follow-up arranged by their service.  Eventually may require bronchoscopy  Mild urinary tract infection with mild hematuria  - Current antibiotic should cover her  Acute respiratory distress with hypoxia, mild -Secondary to underlying pneumonia.  Hypoxia has resolved.  With ambulation her oxygen saturation remains above 90%. -Echocardiogram-EF 60 to 65% - Wean off oxygen  Alcohol abuse -Advised to quit drinking alcohol  Low back pain - Musculoskeletal.  Heating pad, Flexeril  Hypokalemia -Replete as needed  Essential hypertension -Clonidine 0.1 mg at bedtime  Anxiety and depression -Prozac  Hyperlipidemia -Pravachol  Diabetes mellitus type 2, uncontrolled Peripheral neuropathy secondary to DM2 -A1c-8.2 -Resume home regimen    Body mass index is 25.34 kg/m.         Discharge Diagnoses:  Principal Problem:   Sepsis ([place]) Active Problems:   Hyperlipidemia   Lung cancer, lower lobe (HCC)   Diabetes mellitus type 2, uncomplicated ([place])   Multifocal pneumonia      Consultations:  Pulmonary  Subjective: Feels okay wants to go home.  No new complaints today  Discharge Exam: Vitals:   09/21/20 0816 09/21/20 0840  BP: (!) 143/63   Pulse: 79   Resp:    Temp: 98.5 F (36.9 C)   SpO2: 94% 99%  Vitals:   09/20/20 2007 09/21/20 0357 09/21/20 0816 09/21/20 0840  BP: (!) 141/63 (!) 140/55 (!) 143/63    Pulse: 82 70 79   Resp: 20 20    Temp: 98.6 F (37 C) 98.3 F (36.8 C) 98.5 F (36.9 C)   TempSrc: Oral Oral Oral   SpO2: 94% 92% 94% 99%  Weight:  75.6 kg    Height:        General: Pt is alert, awake, not in acute distress Cardiovascular: RRR, S1/S2 +, no rubs, no gallops Respiratory: Mild rhonchi on the right side of the lungs. Abdominal: Soft, NT, ND, bowel sounds + Extremities: no edema, no cyanosis  Discharge Instructions   Allergies as of 09/21/2020      Reactions   Atorvastatin Other (See Comments)   Muscle cramps   Glipizide Er Other (See Comments)   Shaking / tingling in fingers       Medication List    STOP taking these medications   azithromycin 250 MG tablet Commonly known as: Zithromax     TAKE these medications   albuterol 108 (90 Base) MCG/ACT inhaler Commonly known as: VENTOLIN HFA Inhale 2 puffs into the lungs every 6 (six) hours as needed for wheezing or shortness of breath.   AMBULATORY NON FORMULARY MEDICATION Single glucometer with lancets, test strips. Test daily.  Disp qs 3 months E11.9   amoxicillin-clavulanate 875-125 MG tablet Commonly known as: Augmentin Take 1 tablet by mouth every 12 (twelve) hours for 10 days.   B-complex with vitamin C tablet Take 1 tablet by mouth daily.   B-D UF III MINI PEN NEEDLES 31G X 5 MM Misc Generic drug: Insulin Pen Needle USE AS DIRECTED   Basaglar KwikPen 100 UNIT/ML 10 UNITS AT BED INCREASE BY 2 UNITS EVERY 3 DAYS TIL FASTING SUGAR GOAL 120-150 MAX 60 UNITS PER DAY What changed: See the new instructions.   cloNIDine 0.1 MG tablet Commonly known as: CATAPRES Take 1 tablet (0.1 mg total) by mouth at bedtime.   cyclobenzaprine 5 MG tablet Commonly known as: FLEXERIL Take 1 tablet (5 mg total) by mouth 3 (three) times daily as needed for muscle spasms.   FLUoxetine 40 MG capsule Commonly known as: PROZAC Take 1 capsule (40 mg total) by mouth daily.   gabapentin 300 MG capsule Commonly  known as: NEURONTIN TAKE 1 CAPSULE (300 MG TOTAL) BY MOUTH AT BEDTIME AS NEEDED (INSOMNIA).   guaiFENesin 600 MG 12 hr tablet Commonly known as: MUCINEX Take 2 tablets (1,200 mg total) by mouth 2 (two) times daily.   OneTouch Delica Lancets 25K Misc TEST BLOOD SUGARS DAILY   OneTouch Verio test strip Generic drug: glucose blood TEST BLOOD SUGARS DAILY   pravastatin 80 MG tablet Commonly known as: PRAVACHOL Take 1 tablet (80 mg total) by mouth daily.   Vitamin D 50 MCG (2000 UT) tablet Take 2,000 Units by mouth daily.       Follow-up Information    Emeterio Reeve, DO. Schedule an appointment as soon as possible for a visit in 2 week(s).   Specialty: Osteopathic Medicine Why: Office will call with appointment Contact information: [address] [place] [address] [phone]        Freddi Starr, MD. Go on 10/20/2020.   Specialty: Pulmonary Disease Why: 2:30pm Contact information: [address] [place] [address] [phone]        [facility] Follow up.   Specialty: [place]  Why: HHPT Contact information: [address] [place] [address] [phone]              Allergies  Allergen Reactions  . Atorvastatin Other (See Comments)    Muscle cramps  . Glipizide Er Other (See Comments)    Shaking / tingling in fingers     You were cared for by a hospitalist during your hospital stay. If you have any questions about your discharge medications or the care you received while you were in the hospital after you are discharged, you can call the unit and asked to speak with the hospitalist on call if the hospitalist that took care of you is not available. Once you are discharged, your primary care physician will handle any further medical issues. Please note that no refills for any discharge medications will be authorized once you are discharged, as it is imperative that you return to  your primary care physician (or establish a relationship with a primary care physician if you do not have one) for your aftercare needs so that they can reassess your need for medications and monitor your lab values.   Procedures/Studies: CT CHEST WO CONTRAST  Result Date: 09/20/2020 CLINICAL DATA:  75 year old female with history of respiratory illness, shortness of breath and fever. EXAM: CT CHEST WITHOUT CONTRAST TECHNIQUE: Multidetector CT imaging of the chest was performed following the standard protocol without IV contrast. COMPARISON:  Chest CT 04/09/2020. FINDINGS: Cardiovascular: Heart size is normal. There is no significant pericardial fluid, thickening or pericardial calcification. There is aortic atherosclerosis, as well as atherosclerosis of the great vessels of the mediastinum and the coronary arteries, including calcified atherosclerotic plaque in the left main, left anterior descending and right coronary arteries. Mediastinum/Nodes: Several prominent borderline enlarged and mildly enlarged mediastinal and right hilar lymph nodes measuring up to 1.5 cm in short axis in the right hilar region. Esophagus is unremarkable in appearance. No axillary lymphadenopathy. Lungs/Pleura: Patchy areas of airspace consolidation, ground-glass attenuation, extensive septal thickening and nodular and masslike architectural distortion are noted in the right lung, with relative sparing of the right middle lobe. Some additional patchy areas of ground-glass attenuation and septal thickening are noted in the left upper lobe. Small right pleural effusion, partially loculated in the right hemithorax. No left pleural effusion. Upper Abdomen: Marked elevation of the right hemidiaphragm. Aortic atherosclerosis. Amorphous high attenuation material lying dependently in the gallbladder, compatible with biliary sludge. Musculoskeletal: Bilateral breast implants with dense capsular calcifications again noted. In the  inferomedial aspect of the left breast there is again a well-defined 3.1 x 2.7 cm low-attenuation lesion which is similar to the prior examination, most likely to represent a small cyst. There are no aggressive appearing lytic or blastic lesions noted in the visualized portions of the skeleton. IMPRESSION: 1. Multilobar bilateral pneumonia, most severe in the right upper and lower lobes with small right partially loculated parapneumonic pleural effusion and probable reactive right hilar lymphadenopathy. Follow-up noncontrast chest CT is recommended in 1-2 months after trial of antimicrobial therapy to ensure complete resolution of these findings, as the possibility of underlying neoplasm (particularly in the right lower lobe) is difficult to exclude. 2. Aortic atherosclerosis, in addition to left main and 2 vessel coronary artery disease. Assessment for potential risk factor modification, dietary therapy or pharmacologic therapy may be warranted, if clinically indicated. 3. Additional incidental findings, as above. Aortic Atherosclerosis (ICD10-I70.0). Electronically Signed   By: Vinnie Langton M.D.   On: 09/20/2020 10:00  CT ABDOMEN PELVIS W CONTRAST  Result Date: 09/19/2020 CLINICAL DATA:  Abdominal pain and fevers EXAM: CT ABDOMEN AND PELVIS WITH CONTRAST TECHNIQUE: Multidetector CT imaging of the abdomen and pelvis was performed using the standard protocol following bolus administration of intravenous contrast. CONTRAST:  123mL OMNIPAQUE IOHEXOL 300 MG/ML  SOLN COMPARISON:  03/12/2012 FINDINGS: Lower chest: Left lung base is clear with the exception of a tiny subpleural nodule. Patchy infiltrate is noted in the right lower lobe which is new from a prior exam from 04/09/2020 consistent with acute pneumonia. Small associated right-sided effusion is seen. Pleural calcifications are noted slightly increased when compared with the prior exam. Bilateral breast implants are noted. Hepatobiliary: No focal liver  abnormality is seen. No gallstones, gallbladder wall thickening, or biliary dilatation. Pancreas: Unremarkable. No pancreatic ductal dilatation or surrounding inflammatory changes. Spleen: Normal in size without focal abnormality. Adrenals/Urinary Tract: Adrenal glands are within normal limits. Kidneys demonstrate a normal enhancement pattern bilaterally. No obstructive changes are seen. No renal calculi are noted. Ureters are within normal limits bilaterally. Bladder is partially distended. Stomach/Bowel: No obstructive or inflammatory changes of the colon are seen. The appendix is not well visualized consistent with the given clinical history. Small bowel is within normal limits. Stomach is decompressed. Vascular/Lymphatic: Aortic atherosclerosis. No enlarged abdominal or pelvic lymph nodes. Reproductive: Uterus and bilateral adnexa are unremarkable. Other: No abdominal wall hernia or abnormality. No abdominopelvic ascites. Likely sebaceous cyst is noted in the medial aspect of the left breast inferiorly adjacent to the implant best seen on image number 14 of series 3. This has increased in size now measuring 3.3 cm previously measuring 2.3 cm. Musculoskeletal: No acute or significant osseous findings. IMPRESSION: Patchy right basilar infiltrate with associated effusion consistent with pneumonia. This is similar to that seen on prior plain film examination from 09/16/20 Pleural calcification on the right slightly increased from prior CT examination. Likely sebaceous cyst in the left breast slightly larger than that seen on the prior exam. The need for further follow-up can be determined on a clinical basis. Electronically Signed   By: Inez Catalina M.D.   On: 09/19/2020 15:30   DG Chest Portable 1 View  Result Date: 09/16/2020 CLINICAL DATA:  Shortness of breath and fever EXAM: PORTABLE CHEST 1 VIEW COMPARISON:  January 12, 2015 FINDINGS: There is chronic elevation of the right hemidiaphragm. There is ill-defined  airspace opacity throughout portions of the right upper and lower lung regions. The left lung is clear. Heart size and pulmonary vascularity are normal. No adenopathy. There is aortic atherosclerosis. There is postoperative change in the proximal left humerus. There is a calcified breast implant on each side. IMPRESSION: Stable elevation of the right hemidiaphragm. Areas of airspace opacity in the right upper and mid lung regions consistent with multifocal pneumonia. No appreciable consolidation. Note that atypical organism pneumonia could present in this manner. Left lung clear. Heart size normal. Aortic Atherosclerosis (ICD10-I70.0). Electronically Signed   By: Lowella Grip III M.D.   On: 09/16/2020 15:47   ECHOCARDIOGRAM COMPLETE  Result Date: 09/18/2020    ECHOCARDIOGRAM REPORT   Patient Name:   Kelli [name] Date of Exam: 09/18/2020 Medical Rec #:  [id number]    Height:       68.0 in Accession #:    [id number]   Weight:       178.1 lb Date of Birth:  [birth year]    BSA:          1.946 m Patient Age:  74 years     BP:           126/50 mmHg Patient Gender: F            HR:           82 bpm. Exam Location:  Inpatient Procedure: 2D Echo, Cardiac Doppler and Color Doppler Indications:    R07.9* Chest pain, unspecified  Sonographer:    Merrie Roof RDCS Referring Phys: [id number] Lareta Bruneau CHIRAG Bennetta Rudden IMPRESSIONS  1. Left ventricular ejection fraction, by estimation, is 60 to 65%. The left ventricle has normal function. The left ventricle has no regional wall motion abnormalities. There is mild left ventricular hypertrophy. Left ventricular diastolic parameters are indeterminate.  2. Right ventricular systolic function is normal. The right ventricular size is normal.  3. The mitral valve is normal in structure. Trivial mitral valve regurgitation.  4. The aortic valve is normal in structure. Aortic valve regurgitation is not visualized.  5. The inferior vena cava is dilated in size with >50% respiratory variability,  suggesting right atrial pressure of 8 mmHg. FINDINGS  Left Ventricle: Left ventricular ejection fraction, by estimation, is 60 to 65%. The left ventricle has normal function. The left ventricle has no regional wall motion abnormalities. The left ventricular internal cavity size was normal in size. There is  mild left ventricular hypertrophy. Left ventricular diastolic parameters are indeterminate. Right Ventricle: The right ventricular size is normal. Right vetricular wall thickness was not assessed. Right ventricular systolic function is normal. Left Atrium: Left atrial size was normal in size. Right Atrium: Right atrial size was normal in size. Pericardium: There is no evidence of pericardial effusion. Mitral Valve: The mitral valve is normal in structure. Mild mitral annular calcification. Trivial mitral valve regurgitation. Tricuspid Valve: The tricuspid valve is normal in structure. Tricuspid valve regurgitation is trivial. Aortic Valve: The aortic valve is normal in structure. Aortic valve regurgitation is not visualized. Aortic valve mean gradient measures 5.0 mmHg. Aortic valve peak gradient measures 8.9 mmHg. Aortic valve area, by VTI measures 2.24 cm. Pulmonic Valve: The pulmonic valve was not well visualized. Pulmonic valve regurgitation is not visualized. No evidence of pulmonic stenosis. Aorta: The aortic root is normal in size and structure. Venous: The inferior vena cava is dilated in size with greater than 50% respiratory variability, suggesting right atrial pressure of 8 mmHg. IAS/Shunts: No atrial level shunt detected by color flow Doppler.  LEFT VENTRICLE PLAX 2D LVIDd:         4.50 cm  Diastology LVIDs:         3.10 cm  LV e' medial:    6.31 cm/s LV PW:         1.20 cm  LV E/e' medial:  14.5 LV IVS:        1.10 cm  LV e' lateral:   8.05 cm/s LVOT diam:     2.00 cm  LV E/e' lateral: 11.3 LV SV:         67 LV SV Index:   35 LVOT Area:     3.14 cm  RIGHT VENTRICLE          IVC RV Basal diam:  3.50  cm  IVC diam: 2.10 cm LEFT ATRIUM             Index       RIGHT ATRIUM           Index LA diam:        5.00 cm 2.57 cm/m  RA  Area:     14.60 cm LA Vol (A2C):   52.5 ml 26.98 ml/m RA Volume:   37.50 ml  19.27 ml/m LA Vol (A4C):   67.0 ml 34.44 ml/m LA Biplane Vol: 64.3 ml 33.05 ml/m  AORTIC VALVE AV Area (Vmax):    2.45 cm AV Area (Vmean):   2.33 cm AV Area (VTI):     2.24 cm AV Vmax:           149.00 cm/s AV Vmean:          102.000 cm/s AV VTI:            0.300 m AV Peak Grad:      8.9 mmHg AV Mean Grad:      5.0 mmHg LVOT Vmax:         116.00 cm/s LVOT Vmean:        75.500 cm/s LVOT VTI:          0.214 m LVOT/AV VTI ratio: 0.71  AORTA Ao Root diam: 3.30 cm MITRAL VALVE MV Area (PHT): 4.96 cm     SHUNTS MV Decel Time: 153 msec     Systemic VTI:  0.21 m MV E velocity: 91.30 cm/s   Systemic Diam: 2.00 cm MV A velocity: 106.00 cm/s MV E/A ratio:  0.86 Dorris Carnes MD Electronically signed by Dorris Carnes MD Signature Date/Time: 09/18/2020/4:57:50 PM    Final       The results of significant diagnostics from this hospitalization (including imaging, microbiology, ancillary and laboratory) are listed below for reference.     Microbiology: Recent Results (from the past 240 hour(s))  Resp Panel by RT-PCR (Flu A&B, Covid) Nasopharyngeal Swab     Status: None   Collection Time: 09/16/20  2:57 PM   Specimen: Nasopharyngeal Swab; Nasopharyngeal(NP) swabs in vial transport medium  Result Value Ref Range Status   SARS Coronavirus 2 by RT PCR NEGATIVE NEGATIVE Final    Comment: (NOTE) SARS-CoV-2 target nucleic acids are NOT DETECTED.  The SARS-CoV-2 RNA is generally detectable in upper respiratory specimens during the acute phase of infection. The lowest concentration of SARS-CoV-2 viral copies this assay can detect is 138 copies/mL. A negative result does not preclude SARS-Cov-2 infection and should not be used as the sole basis for treatment or other patient management decisions. A negative result may  occur with  improper specimen collection/handling, submission of specimen other than nasopharyngeal swab, presence of viral mutation(s) within the areas targeted by this assay, and inadequate number of viral copies(<138 copies/mL). A negative result must be combined with clinical observations, patient history, and epidemiological information. The expected result is Negative.  Fact Sheet for Patients:  EntrepreneurPulse.com.au  Fact Sheet for Healthcare Providers:  IncredibleEmployment.be  This test is no t yet approved or cleared by the [place] FDA and  has been authorized for detection and/or diagnosis of SARS-CoV-2 by FDA under an Emergency Use Authorization (EUA). This EUA will remain  in effect (meaning this test can be used) for the duration of the COVID-19 declaration under Section 564(b)(1) of the Act, 21 U.S.C.section 360bbb-3(b)(1), unless the authorization is terminated  or revoked sooner.       Influenza A by PCR NEGATIVE NEGATIVE Final   Influenza B by PCR NEGATIVE NEGATIVE Final    Comment: (NOTE) The Xpert Xpress SARS-CoV-2/FLU/RSV plus assay is intended as an aid in the diagnosis of influenza from Nasopharyngeal swab specimens and should not be used as a sole basis for treatment. Nasal washings and  aspirates are unacceptable for Xpert Xpress SARS-CoV-2/FLU/RSV testing.  Fact Sheet for Patients: EntrepreneurPulse.com.au  Fact Sheet for Healthcare Providers: IncredibleEmployment.be  This test is not yet approved or cleared by the [place] FDA and has been authorized for detection and/or diagnosis of SARS-CoV-2 by FDA under an Emergency Use Authorization (EUA). This EUA will remain in effect (meaning this test can be used) for the duration of the COVID-19 declaration under Section 564(b)(1) of the Act, 21 U.S.C. section 360bbb-3(b)(1), unless the authorization is terminated  or revoked.  Performed at [facility], [place] [address], [place], [place] [address]   Culture, blood (routine x 2)     Status: None   Collection Time: 09/16/20  5:00 PM   Specimen: BLOOD  Result Value Ref Range Status   Specimen Description BLOOD SITE NOT SPECIFIED  Final   Special Requests   Final    BOTTLES DRAWN AEROBIC AND ANAEROBIC Blood Culture results may not be optimal due to an excessive volume of blood received in culture bottles   Culture   Final    NO GROWTH 5 DAYS Performed at [facility], [place] [address], [place], [place] [address]    Report Status 09/21/2020 FINAL  Final  Culture, blood (routine x 2)     Status: None   Collection Time: 09/16/20  5:07 PM   Specimen: BLOOD  Result Value Ref Range Status   Specimen Description BLOOD LEFT ANTECUBITAL  Final   Special Requests   Final    BOTTLES DRAWN AEROBIC AND ANAEROBIC Blood Culture adequate volume   Culture   Final    NO GROWTH 5 DAYS Performed at [facility], [place] [address] [place], [place] [address]    Report Status 09/21/2020 FINAL  Final  Expectorated Sputum Assessment w Gram Stain, Rflx to Resp Cult     Status: None   Collection Time: 09/19/20 11:37 AM   Specimen: Expectorated Sputum  Result Value Ref Range Status   Specimen Description EXPECTORATED SPUTUM  Final   Special Requests Normal  Final   Sputum evaluation   Final    Sputum specimen not acceptable for testing.  Please recollect.   Gram Stain Report Called to,Read Back By and Verified With: RN A ROBERTS AT 2028 09/19/2020 BY L BENFIELD Performed at [facility], [place] [address], [place], [place] [address]    Report Status 09/19/2020 FINAL  Final  Culture, blood (Routine X 2) w Reflex to ID Panel     Status: None (Preliminary result)   Collection Time: 09/19/20 12:12 PM   Specimen: BLOOD  Result Value Ref Range Status   Specimen Description BLOOD RIGHT ANTECUBITAL  Final   Special Requests   Final    BOTTLES DRAWN  AEROBIC AND ANAEROBIC Blood Culture results may not be optimal due to an inadequate volume of blood received in culture bottles   Culture   Final    NO GROWTH 2 DAYS Performed at [facility], [place] [address] [place], [place] [address]    Report Status PENDING  Incomplete  Culture, blood (Routine X 2) w Reflex to ID Panel     Status: None (Preliminary result)   Collection Time: 09/19/20 12:12 PM   Specimen: BLOOD RIGHT HAND  Result Value Ref Range Status   Specimen Description BLOOD RIGHT HAND  Final   Special Requests   Final    BOTTLES DRAWN AEROBIC ONLY Blood Culture results may not be optimal due  to an inadequate volume of blood received in culture bottles   Culture   Final    NO GROWTH 2 DAYS Performed at [facility], [place] [address] [place], [place] [address]    Report Status PENDING  Incomplete     Labs: BNP (last 3 results) Recent Labs    09/17/20 0829  BNP 448.1*   Basic Metabolic Panel: Recent Labs  Lab 09/17/20 0244 09/17/20 1114 09/18/20 0201 09/19/20 0254 09/20/20 0343 09/21/20 0359  NA 136 137 140 138 139  --   K 3.8 4.0 3.7 3.6 3.5  --   CL 106 108 111 104 102  --   CO2 24 21* 22 26 26   --   GLUCOSE 147* 158* 170* 126* 115*  --   BUN 11 13 10 8 10   --   CREATININE 0.71 0.75 0.64 0.53 0.64  --   CALCIUM 8.1* 8.3* 8.2* 8.2* 8.6*  --   MG  --  1.9 2.1 1.8 1.9 2.1  PHOS  --  2.2*  --   --   --   --    Liver Function Tests: Recent Labs  Lab 09/16/20 1405 09/17/20 1114 09/18/20 0201 09/19/20 0254 09/20/20 0343  AST 21 27 27 25 30   ALT 28 24 28 28 31   ALKPHOS 114 106 101 105 100  BILITOT 1.0 0.8 0.5 0.4 0.3  PROT 7.3 6.0* 5.6* 5.5* 5.7*  ALBUMIN 2.9* 2.3* 2.2* 2.0* 2.1*   Recent Labs  Lab 09/16/20 1405  LIPASE 22   No results for input(s): AMMONIA in the last 168 hours. CBC: Recent Labs  Lab 09/17/20 1114 09/18/20 0201 09/19/20 0254 09/20/20 0343 09/21/20 0359  WBC 12.6* 11.3* 11.3* 9.9 12.1*  HGB 10.2* 9.6* 9.3* 10.1*  11.4*  HCT 30.7* 29.9* 28.8* 31.0* 34.3*  MCV 93.9 95.2 93.2 94.8 94.2  PLT 384 365 403* 412* 482*   Cardiac Enzymes: No results for input(s): CKTOTAL, CKMB, CKMBINDEX, TROPONINI in the last 168 hours. BNP: Invalid input(s): POCBNP CBG: Recent Labs  Lab 09/20/20 0555 09/20/20 1123 09/20/20 1544 09/20/20 2110 09/21/20 0635  GLUCAP 119* 120* 152* 207* 113*   D-Dimer No results for input(s): DDIMER in the last 72 hours. Hgb A1c No results for input(s): HGBA1C in the last 72 hours. Lipid Profile No results for input(s): CHOL, HDL, LDLCALC, TRIG, CHOLHDL, LDLDIRECT in the last 72 hours. Thyroid function studies No results for input(s): TSH, T4TOTAL, T3FREE, THYROIDAB in the last 72 hours.  Invalid input(s): FREET3 Anemia work up No results for input(s): VITAMINB12, FOLATE, FERRITIN, TIBC, IRON, RETICCTPCT in the last 72 hours. Urinalysis    Component Value Date/Time   COLORURINE AMBER (A) 09/19/2020 1129   APPEARANCEUR HAZY (A) 09/19/2020 1129   LABSPEC 1.008 09/19/2020 1129   PHURINE 6.0 09/19/2020 1129   GLUCOSEU NEGATIVE 09/19/2020 1129   HGBUR MODERATE (A) 09/19/2020 1129   BILIRUBINUR NEGATIVE 09/19/2020 1129   KETONESUR NEGATIVE 09/19/2020 1129   PROTEINUR NEGATIVE 09/19/2020 1129   UROBILINOGEN 0.2 10/01/2014 1240   NITRITE NEGATIVE 09/19/2020 1129   LEUKOCYTESUR MODERATE (A) 09/19/2020 1129   Sepsis Labs Invalid input(s): PROCALCITONIN,  WBC,  LACTICIDVEN Microbiology Recent Results (from the past 240 hour(s))  Resp Panel by RT-PCR (Flu A&B, Covid) Nasopharyngeal Swab     Status: None   Collection Time: 09/16/20  2:57 PM   Specimen: Nasopharyngeal Swab; Nasopharyngeal(NP) swabs in vial transport medium  Result Value Ref Range Status   SARS Coronavirus 2 by RT  PCR NEGATIVE NEGATIVE Final    Comment: (NOTE) SARS-CoV-2 target nucleic acids are NOT DETECTED.  The SARS-CoV-2 RNA is generally detectable in upper respiratory specimens during the acute phase of  infection. The lowest concentration of SARS-CoV-2 viral copies this assay can detect is 138 copies/mL. A negative result does not preclude SARS-Cov-2 infection and should not be used as the sole basis for treatment or other patient management decisions. A negative result may occur with  improper specimen collection/handling, submission of specimen other than nasopharyngeal swab, presence of viral mutation(s) within the areas targeted by this assay, and inadequate number of viral copies(<138 copies/mL). A negative result must be combined with clinical observations, patient history, and epidemiological information. The expected result is Negative.  Fact Sheet for Patients:  EntrepreneurPulse.com.au  Fact Sheet for Healthcare Providers:  IncredibleEmployment.be  This test is no t yet approved or cleared by the [place] FDA and  has been authorized for detection and/or diagnosis of SARS-CoV-2 by FDA under an Emergency Use Authorization (EUA). This EUA will remain  in effect (meaning this test can be used) for the duration of the COVID-19 declaration under Section 564(b)(1) of the Act, 21 U.S.C.section 360bbb-3(b)(1), unless the authorization is terminated  or revoked sooner.       Influenza A by PCR NEGATIVE NEGATIVE Final   Influenza B by PCR NEGATIVE NEGATIVE Final    Comment: (NOTE) The Xpert Xpress SARS-CoV-2/FLU/RSV plus assay is intended as an aid in the diagnosis of influenza from Nasopharyngeal swab specimens and should not be used as a sole basis for treatment. Nasal washings and aspirates are unacceptable for Xpert Xpress SARS-CoV-2/FLU/RSV testing.  Fact Sheet for Patients: EntrepreneurPulse.com.au  Fact Sheet for Healthcare Providers: IncredibleEmployment.be  This test is not yet approved or cleared by the [place] FDA and has been authorized for detection and/or diagnosis of SARS-CoV-2  by FDA under an Emergency Use Authorization (EUA). This EUA will remain in effect (meaning this test can be used) for the duration of the COVID-19 declaration under Section 564(b)(1) of the Act, 21 U.S.C. section 360bbb-3(b)(1), unless the authorization is terminated or revoked.  Performed at [facility], [place] [address], [place], [place] [address]   Culture, blood (routine x 2)     Status: None   Collection Time: 09/16/20  5:00 PM   Specimen: BLOOD  Result Value Ref Range Status   Specimen Description BLOOD SITE NOT SPECIFIED  Final   Special Requests   Final    BOTTLES DRAWN AEROBIC AND ANAEROBIC Blood Culture results may not be optimal due to an excessive volume of blood received in culture bottles   Culture   Final    NO GROWTH 5 DAYS Performed at [facility], [place] [address], [place], [place] [address]    Report Status 09/21/2020 FINAL  Final  Culture, blood (routine x 2)     Status: None   Collection Time: 09/16/20  5:07 PM   Specimen: BLOOD  Result Value Ref Range Status   Specimen Description BLOOD LEFT ANTECUBITAL  Final   Special Requests   Final    BOTTLES DRAWN AEROBIC AND ANAEROBIC Blood Culture adequate volume   Culture   Final    NO GROWTH 5 DAYS Performed at [facility], [place] [address] [place],  [address]    Report Status 09/21/2020 FINAL  Final  Expectorated Sputum Assessment w Gram Stain, Rflx to Resp Cult     Status: None   Collection  Time: 09/19/20 11:37 AM   Specimen: Expectorated Sputum  Result Value Ref Range Status   Specimen Description EXPECTORATED SPUTUM  Final   Special Requests Normal  Final   Sputum evaluation   Final    Sputum specimen not acceptable for testing.  Please recollect.   Gram Stain Report Called to,Read Back By and Verified With: RN A ROBERTS AT 2028 09/19/2020 BY L BENFIELD Performed at [facility], [place] [address] [place], [place] [address]    Report Status 09/19/2020 FINAL  Final  Culture,  blood (Routine X 2) w Reflex to ID Panel     Status: None (Preliminary result)   Collection Time: 09/19/20 12:12 PM   Specimen: BLOOD  Result Value Ref Range Status   Specimen Description BLOOD RIGHT ANTECUBITAL  Final   Special Requests   Final    BOTTLES DRAWN AEROBIC AND ANAEROBIC Blood Culture results may not be optimal due to an inadequate volume of blood received in culture bottles   Culture   Final    NO GROWTH 2 DAYS Performed at [facility], [place] [address], [place], [place] [address]    Report Status PENDING  Incomplete  Culture, blood (Routine X 2) w Reflex to ID Panel     Status: None (Preliminary result)   Collection Time: 09/19/20 12:12 PM   Specimen: BLOOD RIGHT HAND  Result Value Ref Range Status   Specimen Description BLOOD RIGHT HAND  Final   Special Requests   Final    BOTTLES DRAWN AEROBIC ONLY Blood Culture results may not be optimal due to an inadequate volume of blood received in culture bottles   Culture   Final    NO GROWTH 2 DAYS Performed at [facility], [place] [address], [place], [place] [address]    Report Status PENDING  Incomplete     Time coordinating discharge:  I have spent 35 minutes face to face with the patient and on the ward discussing the patients care, assessment, plan and disposition with other care givers. >50% of the time was devoted counseling the patient about the risks and benefits of treatment/Discharge disposition and coordinating care.   SIGNED:   Damita Lack, MD  [facility] 09/21/2020, 10:29 AM   If 7PM-7AM, please contact night-coverage

## 2020-09-21 NOTE — Progress Notes (Signed)
SATURATION QUALIFICATIONS: (This note is used to comply with regulatory documentation for home oxygen)  Patient Saturations on Room Air at Rest = 92%  Patient Saturations on Room Air while Ambulating = 90%  Patient Saturations on -- Liters of oxygen while Ambulating = N/A  Please briefly explain why patient needs home oxygen: Patient does not require supplemental oxygen to maintain SpO2 >/88% with activity.  Mabeline Caras, PT, DPT Acute Rehabilitation Services  Pager [phone] Office [phone]

## 2020-09-21 NOTE — TOC Progression Note (Addendum)
Transition of Care [facility]) - Progression Note    Patient Details  Name: ALICESON DOLBOW MRN: [id number] Date of Birth: 1945/07/13  Transition of Care [facility]) CM/SW Contact  Zenon Mayo, RN Phone Number: 09/21/2020, 9:20 AM  Clinical Narrative:    Patient is for dc today, will need HHPT, NCM offered choice, she has no preference.  NCM made referral to [facility] with [place], she is able to take referral. Soc will begin 24 to 48 hrs post dc. .  Patient lives at home alone, she does not need any DME.  She drives her self to [facility].  She has no issues getting her medications. TOC pharmacy to fill her medications.         Expected Discharge Plan and Services           Expected Discharge Date: 09/21/20                                     Social Determinants of Health (SDOH) Interventions    Readmission Risk Interventions No flowsheet data found.

## 2020-09-22 LAB — URINE CULTURE: Culture: NO GROWTH

## 2020-09-24 LAB — CULTURE, BLOOD (ROUTINE X 2)
Culture: NO GROWTH
Culture: NO GROWTH

## 2020-09-26 DIAGNOSIS — I10 Essential (primary) hypertension: Secondary | ICD-10-CM | POA: Diagnosis not present

## 2020-09-26 DIAGNOSIS — J189 Pneumonia, unspecified organism: Secondary | ICD-10-CM | POA: Diagnosis not present

## 2020-09-26 DIAGNOSIS — M19041 Primary osteoarthritis, right hand: Secondary | ICD-10-CM | POA: Diagnosis not present

## 2020-09-26 DIAGNOSIS — E7800 Pure hypercholesterolemia, unspecified: Secondary | ICD-10-CM | POA: Diagnosis not present

## 2020-09-26 DIAGNOSIS — E119 Type 2 diabetes mellitus without complications: Secondary | ICD-10-CM | POA: Diagnosis not present

## 2020-09-26 DIAGNOSIS — E876 Hypokalemia: Secondary | ICD-10-CM | POA: Diagnosis not present

## 2020-09-26 DIAGNOSIS — R911 Solitary pulmonary nodule: Secondary | ICD-10-CM | POA: Diagnosis not present

## 2020-09-26 DIAGNOSIS — A419 Sepsis, unspecified organism: Secondary | ICD-10-CM | POA: Diagnosis not present

## 2020-09-26 DIAGNOSIS — R69 Illness, unspecified: Secondary | ICD-10-CM | POA: Diagnosis not present

## 2020-09-26 DIAGNOSIS — J440 Chronic obstructive pulmonary disease with acute lower respiratory infection: Secondary | ICD-10-CM | POA: Diagnosis not present

## 2020-09-29 ENCOUNTER — Ambulatory Visit: Payer: Medicare HMO | Admitting: Osteopathic Medicine

## 2020-09-29 ENCOUNTER — Other Ambulatory Visit: Payer: Self-pay | Admitting: Osteopathic Medicine

## 2020-09-29 MED ORDER — CYCLOBENZAPRINE HCL 10 MG PO TABS: 5.0000 mg | ORAL_TABLET | Freq: Three times a day (TID) | ORAL | 0 refills | Status: DC | PRN

## 2020-09-30 ENCOUNTER — Encounter: Payer: Self-pay | Admitting: Osteopathic Medicine

## 2020-09-30 ENCOUNTER — Telehealth: Payer: Self-pay

## 2020-09-30 ENCOUNTER — Ambulatory Visit (INDEPENDENT_AMBULATORY_CARE_PROVIDER_SITE_OTHER): Payer: Medicare HMO | Admitting: Osteopathic Medicine

## 2020-09-30 ENCOUNTER — Other Ambulatory Visit: Payer: Self-pay

## 2020-09-30 VITALS — BP 136/71 | HR 105 | Temp 97.9°F | Wt 174.1 lb

## 2020-09-30 DIAGNOSIS — J189 Pneumonia, unspecified organism: Secondary | ICD-10-CM

## 2020-09-30 DIAGNOSIS — Z794 Long term (current) use of insulin: Secondary | ICD-10-CM | POA: Diagnosis not present

## 2020-09-30 DIAGNOSIS — E119 Type 2 diabetes mellitus without complications: Secondary | ICD-10-CM | POA: Diagnosis not present

## 2020-09-30 DIAGNOSIS — M545 Low back pain, unspecified: Secondary | ICD-10-CM

## 2020-09-30 MED ORDER — HYDROCODONE-ACETAMINOPHEN 5-325 MG PO TABS: 1.0000 | ORAL_TABLET | Freq: Four times a day (QID) | ORAL | 0 refills | Status: DC | PRN

## 2020-09-30 NOTE — Telephone Encounter (Signed)
PA sent for cyclobenzaprine (FLEXERIL) 10 MG tablet. Awaiting response from insurance company.

## 2020-09-30 NOTE — Progress Notes (Signed)
Kelli [name] is a 75 y.o. female who presents to  [place] at [facility]  today, 09/30/20, seeking care for the following:  . Hospital follow up - pneumonia. Doing better. Back pain which developed just prior to hospitalization is her greatest complaint at the moment. CT abd/pelv reviewed and no MSK abnormality (no compression fracture), no saddle anesthesia or loss bowel/bladder control though she reported some diarrhea in hospital (was on IV Abx). Has follow-up in place w/ pulmonary.      ASSESSMENT & PLAN with other pertinent findings:  The primary encounter diagnosis was Community acquired pneumonia of right lower lobe of lung - improving. Diagnoses of Type 2 diabetes mellitus without complication, with long-term current use of insulin (HCC) and Acute bilateral low back pain without sciatica were also pertinent to this visit.   1. Community acquired pneumonia of right lower lobe of lung - improving Finish abx  2. Type 2 diabetes mellitus without complication, with long-term current use of insulin (HCC) Repeat A1C in 3 mos, stable   3. Acute bilateral low back pain without sciatica Pain control and rehab exercises printed Formal PT if no better, recent CT ok    There are no Patient Instructions on file for this visit.  No orders of the defined types were placed in this encounter.   Meds ordered this encounter  Medications  . DISCONTD: HYDROcodone-acetaminophen (NORCO/VICODIN) 5-325 MG tablet    Sig: Take 1-2 tablets by mouth every 6 (six) hours as needed for severe pain.    Dispense:  30 tablet    Refill:  0  . HYDROcodone-acetaminophen (NORCO/VICODIN) 5-325 MG tablet    Sig: Take 1-2 tablets by mouth every 6 (six) hours as needed for severe pain.    Dispense:  30 tablet    Refill:  0     See below for relevant physical exam findings  See below for recent lab and imaging results reviewed  Medications, allergies, PMH, PSH,  SocH, FamH reviewed below    Follow-up instructions: Return in about 3 months (around 12/30/2020) for MONITOR A1C, SEE [place] SOONER IF NEEDED. CALL/MESSAGE W/ QUESTIONS.                                        Exam:  BP 136/71 (BP Location: Left Arm, Patient Position: Sitting, Cuff Size: Normal)   Pulse (!) 105   Temp 97.9 F (36.6 C) (Oral)   Wt 174 lb 1.3 oz (79 kg)   LMP  (LMP Unknown)   BMI 26.47 kg/m   Constitutional: VS see above. General Appearance: alert, well-developed, well-nourished, NAD  Neck: No masses, trachea midline.   Respiratory: Normal respiratory effort. no wheeze, no rhonchi, no rales  Cardiovascular: S1/S2 normal, no murmur, no rub/gallop auscultated. RRR.   Musculoskeletal: Gait normal. Symmetric and independent movement of all extremities. (+)paraspinal muscle spasm lower lumbar  Neurological: Normal balance/coordination. No tremor.  Skin: warm, dry, intact.   Psychiatric: Normal judgment/insight. Normal mood and affect. Oriented x3.   Current Meds  Medication Sig  . albuterol (VENTOLIN HFA) 108 (90 Base) MCG/ACT inhaler Inhale 2 puffs into the lungs every 6 (six) hours as needed for wheezing or shortness of breath.  . AMBULATORY NON FORMULARY MEDICATION Single glucometer with lancets, test strips. Test daily.  Disp qs 3 months E11.9  . B Complex-C (B-COMPLEX WITH VITAMIN C) tablet  Take 1 tablet by mouth daily.  . B-D UF III MINI PEN NEEDLES 31G X 5 MM MISC USE AS DIRECTED  . Cholecalciferol (VITAMIN D) 2000 UNITS tablet Take 2,000 Units by mouth daily.  . cyclobenzaprine (FLEXERIL) 10 MG tablet Take 0.5-1 tablets (5-10 mg total) by mouth 3 (three) times daily as needed for muscle spasms.  [name] FLUoxetine (PROZAC) 40 MG capsule Take 1 capsule (40 mg total) by mouth daily.  [name] guaiFENesin (MUCINEX) 600 MG 12 hr tablet Take 2 tablets (1,200 mg total) by mouth 2 (two) times daily.  . Insulin Glargine (BASAGLAR KWIKPEN) 100  UNIT/ML 10 UNITS AT BED INCREASE BY 2 UNITS EVERY 3 DAYS TIL FASTING SUGAR GOAL 120-150 MAX 60 UNITS PER DAY (Patient taking differently: Inject 35 Units into the skin at bedtime.)  . OneTouch Delica Lancets 99J MISC TEST BLOOD SUGARS DAILY  . ONETOUCH VERIO test strip TEST BLOOD SUGARS DAILY  . pravastatin (PRAVACHOL) 80 MG tablet Take 1 tablet (80 mg total) by mouth daily.  . [DISCONTINUED] amoxicillin-clavulanate (AUGMENTIN) 875-125 MG tablet Take 1 tablet by mouth every 12 (twelve) hours for 10 days.  . [DISCONTINUED] cloNIDine (CATAPRES) 0.1 MG tablet Take 1 tablet (0.1 mg total) by mouth at bedtime.  . [DISCONTINUED] gabapentin (NEURONTIN) 300 MG capsule TAKE 1 CAPSULE (300 MG TOTAL) BY MOUTH AT BEDTIME AS NEEDED (INSOMNIA).  . [DISCONTINUED] HYDROcodone-acetaminophen (NORCO/VICODIN) 5-325 MG tablet Take 1-2 tablets by mouth every 6 (six) hours as needed for severe pain.    Allergies  Allergen Reactions  . Atorvastatin Other (See Comments)    Muscle cramps  . Glipizide Er Other (See Comments)    Shaking / tingling in fingers     Patient Active Problem List   Diagnosis Date Noted  . Sepsis ([place]) 09/17/2020  . Multifocal pneumonia 09/16/2020  . Closed fracture of left proximal humerus 09/26/2018  . Squamous cell carcinoma 03/05/2017  . Echocardiogram shows left ventricular diastolic dysfunction 57/06/7791  . Heart palpitations 07/25/2016  . Need for vaccination for pneumococcus 07/13/2016  . Osteoarthritis of hand, right 08/06/2015  . Diabetes mellitus type 2, uncomplicated ([place]) 90/30/0923  . Lung cancer, lower lobe ([place]) 10/09/2014  . Hematochezia 04/01/2013  . H/O colon cancer, stage II 09/19/2011  . Depression with anxiety 09/19/2011  . Hyperlipidemia 09/19/2011    Family History  Problem Relation Age of Onset  . Heart disease Mother   . Colon polyps Sister   . Heart disease Brother   . Colon cancer Neg Hx   . Esophageal cancer Neg Hx   . Stomach cancer Neg Hx   .  Rectal cancer Neg Hx     Social History   Tobacco Use  Smoking Status Former Smoker  . Packs/day: 3.00  . Years: 40.00  . Pack years: 120.00  . Types: Cigarettes  . Quit date: 02/06/2009  . Years since quitting: 11.6  Smokeless Tobacco Never Used    Past Surgical History:  Procedure Laterality Date  . APPENDECTOMY    . AUGMENTATION MAMMAPLASTY    . BREAST SURGERY  1978   augmentation  . COLON SURGERY     2010  . CRYO INTERCOSTAL NERVE BLOCK N/A 10/05/2014   Procedure: CRYO INTERCOSTAL NERVE BLOCK;  Surgeon: [name], MD;  Location: [facility];  Service: Thoracic;  Laterality: N/A;  . LYMPH NODE DISSECTION Right 10/05/2014   Procedure: LYMPH NODE DISSECTION;  Surgeon: [name], MD;  Location: [facility];  Service: Thoracic;  Laterality: Right;  .  ORIF HUMERUS FRACTURE Left 10/01/2018   Procedure: OPEN REDUCTION INTERNAL FIXATION (ORIF) LEFT PROXIMAL HUMERUS FRACTURE;  Surgeon: [name], MD;  Location: [place];  Service: Orthopedics;  Laterality: Left;  . PARTIAL COLECTOMY Right 2010  . SEGMENTECOMY Right 10/05/2014   Procedure: right lower lobe SEGMENTECTOMY;  Surgeon: [name], MD;  Location: [place];  Service: Thoracic;  Laterality: Right;  [name] VIDEO ASSISTED THORACOSCOPY (VATS)/WEDGE RESECTION Right 10/05/2014   Procedure: Right VIDEO ASSISTED THORACOSCOPY (VATS) with right lower lobe lung nodule WEDGE RESECTION;  Surgeon: [name], MD;  Location: [place];  Service: Thoracic;  Laterality: Right;    Immunization History  Administered Date(s) Administered  . Fluad Quad(high Dose 65+) 03/13/2019, 04/01/2020  . Influenza,inj,Quad PF,6+ Mos 03/20/2018  . PFIZER(Purple Top)SARS-COV-2 Vaccination 08/08/2019, 09/03/2019, 04/12/2020  . Pneumococcal Conjugate-13 08/22/2016  . Pneumococcal-Unspecified 02/03/2009  . Tdap 10/02/2018      No results found.     All questions at time of visit were answered - patient instructed to contact office  with any additional concerns or updates. ER/RTC precautions were reviewed with the patient as applicable.   Please note: manual typing as well as voice recognition software may have been used to produce this document - typos may escape review. Please contact Dr. Sheppard Coil for any needed clarifications.

## 2020-09-30 NOTE — Telephone Encounter (Signed)
Prior authorization for cyclobenzaprine approved by insurance. Pt was  notified during appt.

## 2020-10-05 ENCOUNTER — Ambulatory Visit: Payer: Medicare HMO

## 2020-10-06 ENCOUNTER — Other Ambulatory Visit: Payer: Self-pay | Admitting: Osteopathic Medicine

## 2020-10-20 ENCOUNTER — Encounter: Payer: Self-pay | Admitting: Pulmonary Disease

## 2020-10-20 ENCOUNTER — Other Ambulatory Visit: Payer: Self-pay

## 2020-10-20 ENCOUNTER — Ambulatory Visit (INDEPENDENT_AMBULATORY_CARE_PROVIDER_SITE_OTHER): Payer: Medicare HMO

## 2020-10-20 ENCOUNTER — Ambulatory Visit: Payer: Medicare HMO | Admitting: Pulmonary Disease

## 2020-10-20 VITALS — BP 138/72 | HR 88 | Ht 68.0 in | Wt 176.0 lb

## 2020-10-20 DIAGNOSIS — J90 Pleural effusion, not elsewhere classified: Secondary | ICD-10-CM

## 2020-10-20 DIAGNOSIS — J189 Pneumonia, unspecified organism: Secondary | ICD-10-CM

## 2020-10-20 NOTE — Progress Notes (Signed)
Synopsis: Referred in May 2022 for hospital follow up of pneumonia  Subjective:   PATIENT ID: Kelli [name] GENDER: female DOB: February 19, 1946, MRN: [id number]   HPI  Chief Complaint  Patient presents with  . Hospitalization Follow-up    Pneumonia follow up   Kelli [name] is a 75 year old woman, former smoker with history of RLL lung adenocarcinoma s/p RLL basilar lobe segmentectomy and adenocarcinoma of the cecum who is referred to pulmonary clinic for hospital follow up of pneumonia.   She was admitted 09/16/20 to 09/21/20 for community acquired pneumonia and right pleural effusion. She was treated with a 14 day course of antibiotics, initially treated with azithromycin and cefepime then transitioned to augmentin. She was discharged with a couple of inhalers but she is not currently using them.  Sputum sample from the admission was not acceptable for culture. Her strep pneumo urine ag was negative. She was negative for influenza and covid.  She reports feeling much better since discharge. She is coughing up phlegm occasionally but otherwise feels her breathing is well. She denies fevers, chills, weight loss or lack of appetite.    Past Medical History:  Diagnosis Date  . Anxiety   . Arthritis   . Borderline diabetic   . Colon cancer ([place])    colon ca dx 02/2009, lung cancer  . Depression   . Depression with anxiety 09/19/2011  . H/O colon cancer, stage II 09/19/2011   T3N0  Cecum Sept 2010  . High cholesterol   . Hyperlipidemia 09/19/2011  . Lung nodules 09/19/2011   granulomas  . Urinary urgency      Family History  Problem Relation Age of Onset  . Heart disease Mother   . Colon polyps Sister   . Heart disease Brother   . Colon cancer Neg Hx   . Esophageal cancer Neg Hx   . Stomach cancer Neg Hx   . Rectal cancer Neg Hx      Social History   Socioeconomic History  . Marital status: Divorced    Spouse name: Not on file  . Number of children: Not on file  . Years of  education: Not on file  . Highest education level: Not on file  Occupational History  . Not on file  Tobacco Use  . Smoking status: Former Smoker    Packs/day: 3.00    Years: 40.00    Pack years: 120.00    Types: Cigarettes    Quit date: 02/06/2009    Years since quitting: 11.7  . Smokeless tobacco: Never Used  Vaping Use  . Vaping Use: Never used  Substance and Sexual Activity  . Alcohol use: Yes    Alcohol/week: 0.0 standard drinks    Comment: 3-4 times a week 1-2 beers  . Drug use: No  . Sexual activity: Never    Birth control/protection: Post-menopausal  Other Topics Concern  . Not on file  Social History Narrative  . Not on file   Social Determinants of Health   Financial Resource Strain: Not on file  Food Insecurity: Not on file  Transportation Needs: Not on file  Physical Activity: Not on file  Stress: Not on file  Social Connections: Not on file  Intimate Partner Violence: Not on file     Allergies  Allergen Reactions  . Atorvastatin Other (See Comments)    Muscle cramps  . Glipizide Er Other (See Comments)    Shaking / tingling in fingers      Outpatient  Medications Prior to Visit  Medication Sig Dispense Refill  . AMBULATORY NON FORMULARY MEDICATION Single glucometer with lancets, test strips. Test daily.  Disp qs 3 months E11.9 1 each 0  . B Complex-C (B-COMPLEX WITH VITAMIN C) tablet Take 1 tablet by mouth daily.    . B-D UF III MINI PEN NEEDLES 31G X 5 MM MISC USE AS DIRECTED 100 each 12  . Cholecalciferol (VITAMIN D) 2000 UNITS tablet Take 2,000 Units by mouth daily.    . cyclobenzaprine (FLEXERIL) 10 MG tablet Take 0.5-1 tablets (5-10 mg total) by mouth 3 (three) times daily as needed for muscle spasms. 45 tablet 0  . FLUoxetine (PROZAC) 40 MG capsule Take 1 capsule (40 mg total) by mouth daily. 90 capsule 3  . Insulin Glargine (BASAGLAR KWIKPEN) 100 UNIT/ML 10 UNITS AT BED INCREASE BY 2 UNITS EVERY 3 DAYS TIL FASTING SUGAR GOAL 120-150 MAX 60  UNITS PER DAY (Patient taking differently: Inject 35 Units into the skin at bedtime.) 3 mL 11  . OneTouch Delica Lancets 02H MISC TEST BLOOD SUGARS DAILY 100 each 0  . ONETOUCH VERIO test strip TEST BLOOD SUGARS DAILY 100 strip 1  . pravastatin (PRAVACHOL) 80 MG tablet TAKE 1 TABLET BY MOUTH EVERY DAY 90 tablet 3  . albuterol (VENTOLIN HFA) 108 (90 Base) MCG/ACT inhaler Inhale 2 puffs into the lungs every 6 (six) hours as needed for wheezing or shortness of breath. 18 g 1  . guaiFENesin (MUCINEX) 600 MG 12 hr tablet Take 2 tablets (1,200 mg total) by mouth 2 (two) times daily.    [name] HYDROcodone-acetaminophen (NORCO/VICODIN) 5-325 MG tablet Take 1-2 tablets by mouth every 6 (six) hours as needed for severe pain. 30 tablet 0   No facility-administered medications prior to visit.    Review of Systems  Constitutional: Negative for chills, fever, malaise/fatigue and weight loss.  HENT: Negative for congestion, sinus pain and sore throat.   Eyes: Negative.   Respiratory: Negative for cough, hemoptysis, sputum production, shortness of breath and wheezing.   Cardiovascular: Negative for chest pain, palpitations, orthopnea, claudication and leg swelling.  Gastrointestinal: Negative for abdominal pain, heartburn, nausea and vomiting.  Genitourinary: Negative.   Musculoskeletal: Negative for joint pain and myalgias.  Skin: Negative for rash.  Neurological: Negative for weakness.  Endo/Heme/Allergies: Negative.   Psychiatric/Behavioral: Negative.       Objective:   Vitals:   10/20/20 1429  BP: 138/72  Pulse: 88  SpO2: 98%  Weight: 176 lb (79.8 kg)  Height: 5\' 8"  (1.727 m)     Physical Exam Constitutional:      General: She is not in acute distress.    Appearance: She is not ill-appearing.  HENT:     Head: Normocephalic and atraumatic.  Eyes:     General: No scleral icterus.    Conjunctiva/sclera: Conjunctivae normal.     Pupils: Pupils are equal, round, and reactive to light.   Cardiovascular:     Rate and Rhythm: Normal rate and regular rhythm.     Pulses: Normal pulses.     Heart sounds: Normal heart sounds. No murmur heard.   Pulmonary:     Effort: Pulmonary effort is normal.     Breath sounds: Normal breath sounds. Decreased air movement present. No wheezing, rhonchi or rales.  Abdominal:     General: Bowel sounds are normal.     Palpations: Abdomen is soft.  Musculoskeletal:     Right lower leg: No edema.     Left  lower leg: No edema.  Lymphadenopathy:     Cervical: No cervical adenopathy.  Skin:    General: Skin is warm and dry.  Neurological:     General: No focal deficit present.     Mental Status: She is alert.  Psychiatric:        Mood and Affect: Mood normal.        Behavior: Behavior normal.        Thought Content: Thought content normal.        Judgment: Judgment normal.     CBC    Component Value Date/Time   WBC 12.1 (H) 09/21/2020 0359   RBC 3.64 (L) 09/21/2020 0359   HGB 11.4 (L) 09/21/2020 0359   HGB 12.3 08/28/2014 0942   HCT 34.3 (L) 09/21/2020 0359   HCT 36.5 08/28/2014 0942   PLT 482 (H) 09/21/2020 0359   PLT 269 08/28/2014 0942   MCV 94.2 09/21/2020 0359   MCV 92.9 08/28/2014 0942   MCH 31.3 09/21/2020 0359   MCHC 33.2 09/21/2020 0359   RDW 13.6 09/21/2020 0359   RDW 12.8 08/28/2014 0942   LYMPHSABS 1,932 08/13/2018 1351   LYMPHSABS 2.7 08/28/2014 0942   MONOABS 525 07/25/2016 1204   MONOABS 0.4 08/28/2014 0942   EOSABS 343 08/13/2018 1351   EOSABS 0.3 08/28/2014 0942   BASOSABS 42 08/13/2018 1351   BASOSABS 0.1 08/28/2014 0942   BMP Latest Ref Rng & Units 09/20/2020 09/19/2020 09/18/2020  Glucose 70 - 99 mg/dL 115(H) 126(H) 170(H)  BUN 8 - 23 mg/dL 10 8 10   Creatinine 0.44 - 1.00 mg/dL 0.64 0.53 0.64  BUN/Creat Ratio 6 - 22 (calc) - - -  Sodium 135 - 145 mmol/L 139 138 140  Potassium 3.5 - 5.1 mmol/L 3.5 3.6 3.7  Chloride 98 - 111 mmol/L 102 104 111  CO2 22 - 32 mmol/L 26 26 22   Calcium 8.9 - 10.3 mg/dL  8.6(L) 8.2(L) 8.2(L)     Chest imaging: CXR 10/20/20 Improved airspace opacities of the right lung. There also appears to be resolution of the right pleural effusion.   CT Chest 09/20/20 1. Multilobar bilateral pneumonia, most severe in the right upper and lower lobes with small right partially loculated parapneumonic pleural effusion and probable reactive right hilar lymphadenopathy. Follow-up noncontrast chest CT is recommended in 1-2 months after trial of antimicrobial therapy to ensure complete resolution of these findings, as the possibility of underlying neoplasm (particularly in the right lower lobe) is difficult to exclude.  PFT: PFT Results Latest Ref Rng & Units 09/22/2014  FVC-Pre L 2.92  FVC-Predicted Pre % 77  FVC-Post L 3.09  FVC-Predicted Post % 82  Pre FEV1/FVC % % 70  Post FEV1/FCV % % 72  FEV1-Pre L 2.04  FEV1-Predicted Pre % 71  FEV1-Post L 2.22  DLCO uncorrected ml/min/mmHg 15.21  DLCO [facility]% % 49  DLVA Predicted % 59  TLC L 5.38  TLC % Predicted % 92  RV % Predicted % 99    Echo 09/18/20: 1. Left ventricular ejection fraction, by estimation, is 60 to 65%. The  left ventricle has normal function. The left ventricle has no regional  wall motion abnormalities. There is mild left ventricular hypertrophy.  Left ventricular diastolic parameters  are indeterminate.  2. Right ventricular systolic function is normal. The right ventricular  size is normal.  3. The mitral valve is normal in structure. Trivial mitral valve  regurgitation.  4. The aortic valve is normal in structure. Aortic valve regurgitation  is  not visualized.  5. The inferior vena cava is dilated in size with >50% respiratory  variability, suggesting right atrial pressure of 8 mmHg.   Assessment & Plan:   Pneumonia due to infectious organism, unspecified laterality, unspecified part of lung - Plan: DG Chest 2 View, CT Chest Wo Contrast  Pleural effusion on right  Discussion: Kelli [name] is a 75 year old woman, former smoker with history of RLL lung adenocarcinoma s/p RLL basilar lobe segmentectomy and adenocarcinoma of the cecum who is referred to pulmonary clinic for hospital follow up of pneumonia.   She is clinically improved since completing her course of antibiotics. Her chest radiograph today shows resolution of her infiltrates/opacities and the right pleural effusion.   We will check CT chest in early June for closer follow up given her history of lung cancer of the RLL. We will schedule follow up if needed based on the CT chest results.   Kelli Jackson, MD [facility] Office: [phone]    Current Outpatient Medications:  .  AMBULATORY NON FORMULARY MEDICATION, Single glucometer with lancets, test strips. Test daily.  Disp qs 3 months E11.9, Disp: 1 each, Rfl: 0 .  B Complex-C (B-COMPLEX WITH VITAMIN C) tablet, Take 1 tablet by mouth daily., Disp: , Rfl:  .  B-D UF III MINI PEN NEEDLES 31G X 5 MM MISC, USE AS DIRECTED, Disp: 100 each, Rfl: 12 .  Cholecalciferol (VITAMIN D) 2000 UNITS tablet, Take 2,000 Units by mouth daily., Disp: , Rfl:  .  cyclobenzaprine (FLEXERIL) 10 MG tablet, Take 0.5-1 tablets (5-10 mg total) by mouth 3 (three) times daily as needed for muscle spasms., Disp: 45 tablet, Rfl: 0 .  FLUoxetine (PROZAC) 40 MG capsule, Take 1 capsule (40 mg total) by mouth daily., Disp: 90 capsule, Rfl: 3 .  Insulin Glargine (BASAGLAR KWIKPEN) 100 UNIT/ML, 10 UNITS AT BED INCREASE BY 2 UNITS EVERY 3 DAYS TIL FASTING SUGAR GOAL 120-150 MAX 60 UNITS PER DAY (Patient taking differently: Inject 35 Units into the skin at bedtime.), Disp: 3 mL, Rfl: 11 .  OneTouch Delica Lancets 25D MISC, TEST BLOOD SUGARS DAILY, Disp: 100 each, Rfl: 0 .  ONETOUCH VERIO test strip, TEST BLOOD SUGARS DAILY, Disp: 100 strip, Rfl: 1 .  pravastatin (PRAVACHOL) 80 MG tablet, TAKE 1 TABLET BY MOUTH EVERY DAY, Disp: 90 tablet, Rfl: 3

## 2020-10-20 NOTE — Patient Instructions (Addendum)
We will check a chest x-ray today  We will schedule you for a CT chest scan in 2-3 weeks

## 2020-10-21 ENCOUNTER — Encounter: Payer: Self-pay | Admitting: Pulmonary Disease

## 2020-10-25 ENCOUNTER — Ambulatory Visit (INDEPENDENT_AMBULATORY_CARE_PROVIDER_SITE_OTHER): Payer: Medicare HMO | Admitting: Osteopathic Medicine

## 2020-10-25 ENCOUNTER — Ambulatory Visit (INDEPENDENT_AMBULATORY_CARE_PROVIDER_SITE_OTHER): Payer: Medicare HMO

## 2020-10-25 ENCOUNTER — Other Ambulatory Visit: Payer: Self-pay

## 2020-10-25 ENCOUNTER — Encounter: Payer: Self-pay | Admitting: Osteopathic Medicine

## 2020-10-25 VITALS — BP 124/54 | HR 92 | Temp 98.5°F | Wt 177.0 lb

## 2020-10-25 DIAGNOSIS — Z780 Asymptomatic menopausal state: Secondary | ICD-10-CM | POA: Diagnosis not present

## 2020-10-25 DIAGNOSIS — M545 Low back pain, unspecified: Secondary | ICD-10-CM

## 2020-10-25 DIAGNOSIS — R918 Other nonspecific abnormal finding of lung field: Secondary | ICD-10-CM | POA: Diagnosis not present

## 2020-10-25 DIAGNOSIS — J189 Pneumonia, unspecified organism: Secondary | ICD-10-CM | POA: Diagnosis not present

## 2020-10-25 DIAGNOSIS — F1190 Opioid use, unspecified, uncomplicated: Secondary | ICD-10-CM | POA: Diagnosis not present

## 2020-10-25 DIAGNOSIS — J439 Emphysema, unspecified: Secondary | ICD-10-CM | POA: Diagnosis not present

## 2020-10-25 DIAGNOSIS — Z0000 Encounter for general adult medical examination without abnormal findings: Secondary | ICD-10-CM

## 2020-10-25 MED ORDER — HYDROCODONE-ACETAMINOPHEN 5-325 MG PO TABS: 1.0000 | ORAL_TABLET | Freq: Four times a day (QID) | ORAL | 0 refills | Status: DC | PRN

## 2020-10-25 NOTE — Progress Notes (Signed)
Kelli [name] is a 75 y.o. female who presents to  [place] at [facility]  today, 10/25/20, seeking care for the following:  . Continued low back pain w/ standing up from seated position, no saddle anesthesia or LE numbness, no new incontinence, no falls. Requests refill Hydrocodone to have on hand      ASSESSMENT & PLAN with other pertinent findings:  The encounter diagnosis was Acute bilateral low back pain without sciatica.   Indication for chronic opioid: severe lower back pain / arthritis Medication and dose: Hydrocodone-APAP 5-325 mg, take 1-2 tablets po q6h prn severe pain  # pills per month: 30 Last UDS date: today 10/25/20  Opioid Treatment Agreement signed (Y/N): 10/25/20  Opioid Treatment Agreement last reviewed with patient:   10/25/20  NCCSRS reviewed this encounter (include red flags): Yes   There are no Patient Instructions on file for this visit.  Orders Placed This Encounter  Procedures  . DRUG MONITOR, PANEL 5, SCREEN, URINE    Meds ordered this encounter  Medications  . HYDROcodone-acetaminophen (NORCO/VICODIN) 5-325 MG tablet    Sig: Take 1-2 tablets by mouth every 6 (six) hours as needed for severe pain.    Dispense:  30 tablet    Refill:  0     See below for relevant physical exam findings  See below for recent lab and imaging results reviewed  Medications, allergies, PMH, PSH, SocH, FamH reviewed below    Follow-up instructions: Return for [place].                                        Exam:  BP (!) 124/54 (BP Location: Left Arm, Patient Position: Sitting, Cuff Size: Normal)   Pulse 92   Temp 98.5 F (36.9 C) (Oral)   Wt 177 lb (80.3 kg)   LMP  (LMP Unknown)   BMI 26.91 kg/m   Constitutional: VS see above. General Appearance: alert, well-developed, well-nourished, NAD  Neck: No masses, trachea midline.   Respiratory:  Normal respiratory effort.  Musculoskeletal: Gait normal but needs to use arm rests for support as she stands up from seated position. Symmetric and independent movement of all extremities  Neurological: Normal balance/coordination. No tremor.  Skin: warm, dry, intact.   Psychiatric: Normal judgment/insight. Normal mood and affect. Oriented x3.   Current Meds  Medication Sig  . AMBULATORY NON FORMULARY MEDICATION Single glucometer with lancets, test strips. Test daily.  Disp qs 3 months E11.9  . B Complex-C (B-COMPLEX WITH VITAMIN C) tablet Take 1 tablet by mouth daily.  . B-D UF III MINI PEN NEEDLES 31G X 5 MM MISC USE AS DIRECTED  . Cholecalciferol (VITAMIN D) 2000 UNITS tablet Take 2,000 Units by mouth daily.  . cyclobenzaprine (FLEXERIL) 10 MG tablet Take 0.5-1 tablets (5-10 mg total) by mouth 3 (three) times daily as needed for muscle spasms.  [name] FLUoxetine (PROZAC) 40 MG capsule Take 1 capsule (40 mg total) by mouth daily.  . Insulin Glargine (BASAGLAR KWIKPEN) 100 UNIT/ML 10 UNITS AT BED INCREASE BY 2 UNITS EVERY 3 DAYS TIL FASTING SUGAR GOAL 120-150 MAX 60 UNITS PER DAY (Patient taking differently: Inject 35 Units into the skin at bedtime.)  . OneTouch Delica Lancets 21J MISC TEST BLOOD SUGARS DAILY  . ONETOUCH VERIO test strip TEST BLOOD SUGARS DAILY  . pravastatin (PRAVACHOL) 80 MG tablet  TAKE 1 TABLET BY MOUTH EVERY DAY    Allergies  Allergen Reactions  . Atorvastatin Other (See Comments)    Muscle cramps  . Glipizide Er Other (See Comments)    Shaking / tingling in fingers     Patient Active Problem List   Diagnosis Date Noted  . Sepsis ([place]) 09/17/2020  . Multifocal pneumonia 09/16/2020  . Closed fracture of left proximal humerus 09/26/2018  . Squamous cell carcinoma 03/05/2017  . Echocardiogram shows left ventricular diastolic dysfunction 45/62/5638  . Heart palpitations 07/25/2016  . Need for vaccination for pneumococcus 07/13/2016  . Osteoarthritis of hand,  right 08/06/2015  . Diabetes mellitus type 2, uncomplicated ([place]) 93/73/4287  . Lung cancer, lower lobe ([place]) 10/09/2014  . Hematochezia 04/01/2013  . H/O colon cancer, stage II 09/19/2011  . Depression with anxiety 09/19/2011  . Hyperlipidemia 09/19/2011    Family History  Problem Relation Age of Onset  . Heart disease Mother   . Colon polyps Sister   . Heart disease Brother   . Colon cancer Neg Hx   . Esophageal cancer Neg Hx   . Stomach cancer Neg Hx   . Rectal cancer Neg Hx     Social History   Tobacco Use  Smoking Status Former Smoker  . Packs/day: 3.00  . Years: 40.00  . Pack years: 120.00  . Types: Cigarettes  . Quit date: 02/06/2009  . Years since quitting: 11.7  Smokeless Tobacco Never Used    Past Surgical History:  Procedure Laterality Date  . APPENDECTOMY    . AUGMENTATION MAMMAPLASTY    . BREAST SURGERY  1978   augmentation  . COLON SURGERY     2010  . CRYO INTERCOSTAL NERVE BLOCK N/A 10/05/2014   Procedure: CRYO INTERCOSTAL NERVE BLOCK;  Surgeon: [name], MD;  Location: [place];  Service: Thoracic;  Laterality: N/A;  . LYMPH NODE DISSECTION Right 10/05/2014   Procedure: LYMPH NODE DISSECTION;  Surgeon: [name], MD;  Location: [place];  Service: Thoracic;  Laterality: Right;  . ORIF HUMERUS FRACTURE Left 10/01/2018   Procedure: OPEN REDUCTION INTERNAL FIXATION (ORIF) LEFT PROXIMAL HUMERUS FRACTURE;  Surgeon: [name], MD;  Location: [place];  Service: Orthopedics;  Laterality: Left;  . PARTIAL COLECTOMY Right 2010  . SEGMENTECOMY Right 10/05/2014   Procedure: right lower lobe SEGMENTECTOMY;  Surgeon: [name], MD;  Location: [place];  Service: Thoracic;  Laterality: Right;  [name] VIDEO ASSISTED THORACOSCOPY (VATS)/WEDGE RESECTION Right 10/05/2014   Procedure: Right VIDEO ASSISTED THORACOSCOPY (VATS) with right lower lobe lung nodule WEDGE RESECTION;  Surgeon: [name], MD;  Location: [place];  Service: Thoracic;   Laterality: Right;    Immunization History  Administered Date(s) Administered  . Fluad Quad(high Dose 65+) 03/13/2019, 04/01/2020  . Influenza,inj,Quad PF,6+ Mos 03/20/2018  . PFIZER(Purple Top)SARS-COV-2 Vaccination 08/08/2019, 09/03/2019, 04/12/2020  . Pneumococcal Conjugate-13 08/22/2016  . Pneumococcal-Unspecified 02/03/2009  . Tdap 10/02/2018    Recent Results (from the past 2160 hour(s))  Lipase, blood     Status: None   Collection Time: 09/16/20  2:05 PM  Result Value Ref Range   Lipase 22 11 - 51 U/L    Comment: Performed at [facility] [address], [place], [place] [address]  Comprehensive metabolic panel     Status: Abnormal   Collection Time: 09/16/20  2:05 PM  Result Value Ref Range   Sodium 138 135 - 145 mmol/L   Potassium 3.2 (L) 3.5 -  5.1 mmol/L   Chloride 102 98 - 111 mmol/L   CO2 21 (L) 22 - 32 mmol/L   Glucose, Bld 153 (H) 70 - 99 mg/dL    Comment: Glucose reference range applies only to samples taken after fasting for at least 8 hours.   BUN 12 8 - 23 mg/dL   Creatinine, Ser 0.78 0.44 - 1.00 mg/dL   Calcium 8.8 (L) 8.9 - 10.3 mg/dL   Total Protein 7.3 6.5 - 8.1 g/dL   Albumin 2.9 (L) 3.5 - 5.0 g/dL   AST 21 15 - 41 U/L   ALT 28 0 - 44 U/L   Alkaline Phosphatase 114 38 - 126 U/L   Total Bilirubin 1.0 0.3 - 1.2 mg/dL   GFR, Estimated >60 >60 mL/min    Comment: (NOTE) Calculated using the CKD-EPI Creatinine Equation (2021)    Anion gap 15 5 - 15    Comment: Performed at [facility], [place] [address] [place], [place] [address]  CBC     Status: Abnormal   Collection Time: 09/16/20  2:05 PM  Result Value Ref Range   WBC 17.3 (H) 4.0 - 10.5 K/uL   RBC 3.84 (L) 3.87 - 5.11 MIL/uL   Hemoglobin 11.8 (L) 12.0 - 15.0 g/dL   HCT 36.0 36.0 - 46.0 %   MCV 93.8 80.0 - 100.0 fL   MCH 30.7 26.0 - 34.0 pg   MCHC 32.8 30.0 - 36.0 g/dL   RDW 13.4 11.5 - 15.5 %   Platelets 482 (H) 150 - 400 K/uL   nRBC 0.0 0.0 - 0.2 %    Comment: Performed at  [facility], [place] [address], [place], [place] [address]  Resp Panel by RT-PCR (Flu A&B, Covid) Nasopharyngeal Swab     Status: None   Collection Time: 09/16/20  2:57 PM   Specimen: Nasopharyngeal Swab; Nasopharyngeal(NP) swabs in vial transport medium  Result Value Ref Range   SARS Coronavirus 2 by RT PCR NEGATIVE NEGATIVE    Comment: (NOTE) SARS-CoV-2 target nucleic acids are NOT DETECTED.  The SARS-CoV-2 RNA is generally detectable in upper respiratory specimens during the acute phase of infection. The lowest concentration of SARS-CoV-2 viral copies this assay can detect is 138 copies/mL. A negative result does not preclude SARS-Cov-2 infection and should not be used as the sole basis for treatment or other patient management decisions. A negative result may occur with  improper specimen collection/handling, submission of specimen other than nasopharyngeal swab, presence of viral mutation(s) within the areas targeted by this assay, and inadequate number of viral copies(<138 copies/mL). A negative result must be combined with clinical observations, patient history, and epidemiological information. The expected result is Negative.  Fact Sheet for Patients:  EntrepreneurPulse.com.au  Fact Sheet for Healthcare Providers:  IncredibleEmployment.be  This test is no t yet approved or cleared by the [place] FDA and  has been authorized for detection and/or diagnosis of SARS-CoV-2 by FDA under an Emergency Use Authorization (EUA). This EUA will remain  in effect (meaning this test can be used) for the duration of the COVID-19 declaration under Section 564(b)(1) of the Act, 21 U.S.C.section 360bbb-3(b)(1), unless the authorization is terminated  or revoked sooner.       Influenza A by PCR NEGATIVE NEGATIVE   Influenza B by PCR NEGATIVE NEGATIVE    Comment: (NOTE) The Xpert Xpress SARS-CoV-2/FLU/RSV plus assay is intended as an aid in  the diagnosis of influenza from Nasopharyngeal swab specimens and should  not be used as a sole basis for treatment. Nasal washings and aspirates are unacceptable for Xpert Xpress SARS-CoV-2/FLU/RSV testing.  Fact Sheet for Patients: EntrepreneurPulse.com.au  Fact Sheet for Healthcare Providers: IncredibleEmployment.be  This test is not yet approved or cleared by the [place] FDA and has been authorized for detection and/or diagnosis of SARS-CoV-2 by FDA under an Emergency Use Authorization (EUA). This EUA will remain in effect (meaning this test can be used) for the duration of the COVID-19 declaration under Section 564(b)(1) of the Act, 21 U.S.C. section 360bbb-3(b)(1), unless the authorization is terminated or revoked.  Performed at [facility], [place] [address] [place], [place] [address]   Culture, blood (routine x 2)     Status: None   Collection Time: 09/16/20  5:00 PM   Specimen: BLOOD  Result Value Ref Range   Specimen Description BLOOD SITE NOT SPECIFIED    Special Requests      BOTTLES DRAWN AEROBIC AND ANAEROBIC Blood Culture results may not be optimal due to an excessive volume of blood received in culture bottles   Culture      NO GROWTH 5 DAYS Performed at [facility], [place] [address], [place], [place] [address]    Report Status 09/21/2020 FINAL   Culture, blood (routine x 2)     Status: None   Collection Time: 09/16/20  5:07 PM   Specimen: BLOOD  Result Value Ref Range   Specimen Description BLOOD LEFT ANTECUBITAL    Special Requests      BOTTLES DRAWN AEROBIC AND ANAEROBIC Blood Culture adequate volume   Culture      NO GROWTH 5 DAYS Performed at [facility], [place] [address], [place], [place] [address]    Report Status 09/21/2020 FINAL   CBC     Status: Abnormal   Collection Time: 09/16/20  9:29 PM  Result Value Ref Range   WBC 15.9 (H) 4.0 - 10.5 K/uL   RBC 3.42 (L) 3.87 - 5.11 MIL/uL    Hemoglobin 10.6 (L) 12.0 - 15.0 g/dL   HCT 32.2 (L) 36.0 - 46.0 %   MCV 94.2 80.0 - 100.0 fL   MCH 31.0 26.0 - 34.0 pg   MCHC 32.9 30.0 - 36.0 g/dL   RDW 13.6 11.5 - 15.5 %   Platelets 415 (H) 150 - 400 K/uL   nRBC 0.0 0.0 - 0.2 %    Comment: Performed at [facility] [address], [place], [place] [address]  Creatinine, serum     Status: None   Collection Time: 09/16/20  9:29 PM  Result Value Ref Range   Creatinine, Ser 0.77 0.44 - 1.00 mg/dL   GFR, Estimated >60 >60 mL/min    Comment: (NOTE) Calculated using the CKD-EPI Creatinine Equation (2021) Performed at [facility] [address], [place],  [address]   Hemoglobin A1c     Status: Abnormal   Collection Time: 09/16/20  9:29 PM  Result Value Ref Range   Hgb A1c MFr Bld 8.2 (H) 4.8 - 5.6 %    Comment: (NOTE) Pre diabetes:          5.7%-6.4%  Diabetes:              >6.4%  Glycemic control for   <7.0% adults with diabetes    Mean Plasma Glucose 188.64 mg/dL    Comment: Performed at [facility] [address], [place], [place] [address]  Glucose, capillary  Status: Abnormal   Collection Time: 09/16/20 10:49 PM  Result Value Ref Range   Glucose-Capillary 166 (H) 70 - 99 mg/dL    Comment: Glucose reference range applies only to samples taken after fasting for at least 8 hours.  Basic metabolic panel     Status: Abnormal   Collection Time: 09/17/20  2:44 AM  Result Value Ref Range   Sodium 136 135 - 145 mmol/L   Potassium 3.8 3.5 - 5.1 mmol/L   Chloride 106 98 - 111 mmol/L   CO2 24 22 - 32 mmol/L   Glucose, Bld 147 (H) 70 - 99 mg/dL    Comment: Glucose reference range applies only to samples taken after fasting for at least 8 hours.   BUN 11 8 - 23 mg/dL   Creatinine, Ser 0.71 0.44 - 1.00 mg/dL   Calcium 8.1 (L) 8.9 - 10.3 mg/dL   GFR, Estimated >60 >60 mL/min    Comment: (NOTE) Calculated using the CKD-EPI Creatinine Equation (2021)    Anion gap 6 5 - 15    Comment: Performed at  [facility] [address], [place], [place] [address]  CBC     Status: Abnormal   Collection Time: 09/17/20  2:44 AM  Result Value Ref Range   WBC 14.3 (H) 4.0 - 10.5 K/uL   RBC 3.13 (L) 3.87 - 5.11 MIL/uL   Hemoglobin 9.7 (L) 12.0 - 15.0 g/dL   HCT 29.3 (L) 36.0 - 46.0 %   MCV 93.6 80.0 - 100.0 fL   MCH 31.0 26.0 - 34.0 pg   MCHC 33.1 30.0 - 36.0 g/dL   RDW 13.8 11.5 - 15.5 %   Platelets 385 150 - 400 K/uL   nRBC 0.0 0.0 - 0.2 %    Comment: Performed at [facility], [place] [address], [place], [place] [address]  Glucose, capillary     Status: Abnormal   Collection Time: 09/17/20  5:31 AM  Result Value Ref Range   Glucose-Capillary 130 (H) 70 - 99 mg/dL    Comment: Glucose reference range applies only to samples taken after fasting for at least 8 hours.  Procalcitonin     Status: None   Collection Time: 09/17/20  8:29 AM  Result Value Ref Range   Procalcitonin 0.17 ng/mL    Comment:        Interpretation: PCT (Procalcitonin) <= 0.5 ng/mL: Systemic infection (sepsis) is not likely. Local bacterial infection is possible. (NOTE)       Sepsis PCT Algorithm           Lower Respiratory Tract                                      Infection PCT Algorithm    ----------------------------     ----------------------------         PCT < 0.25 ng/mL                PCT < 0.10 ng/mL          Strongly encourage             Strongly discourage   discontinuation of antibiotics    initiation of antibiotics    ----------------------------     -----------------------------       PCT 0.25 - 0.50 ng/mL            PCT 0.10 - 0.25 ng/mL  OR       >80% decrease in PCT            Discourage initiation of                                            antibiotics      Encourage discontinuation           of antibiotics    ----------------------------     -----------------------------         PCT >= 0.50 ng/mL              PCT 0.26 - 0.50 ng/mL               AND        <80%  decrease in PCT             Encourage initiation of                                             antibiotics       Encourage continuation           of antibiotics    ----------------------------     -----------------------------        PCT >= 0.50 ng/mL                  PCT > 0.50 ng/mL               AND         increase in PCT                  Strongly encourage                                      initiation of antibiotics    Strongly encourage escalation           of antibiotics                                     -----------------------------                                           PCT <= 0.25 ng/mL                                                 OR                                        > 80% decrease in PCT                                      Discontinue / Do not initiate  antibiotics  Performed at [facility], [place] [address], [place],  [place] [address]   Brain natriuretic peptide     Status: Abnormal   Collection Time: 09/17/20  8:29 AM  Result Value Ref Range   B Natriuretic Peptide 166.7 (H) 0.0 - 100.0 pg/mL    Comment: Performed at [facility] [address], [place], [place] [address]  Lactic acid, plasma     Status: None   Collection Time: 09/17/20  8:29 AM  Result Value Ref Range   Lactic Acid, Venous 0.9 0.5 - 1.9 mmol/L    Comment: Performed at [facility] [address], [place], [place] [address]  Glucose, capillary     Status: Abnormal   Collection Time: 09/17/20  9:47 AM  Result Value Ref Range   Glucose-Capillary 161 (H) 70 - 99 mg/dL    Comment: Glucose reference range applies only to samples taken after fasting for at least 8 hours.  Lactic acid, plasma     Status: None   Collection Time: 09/17/20 11:14 AM  Result Value Ref Range   Lactic Acid, Venous 1.1 0.5 - 1.9 mmol/L    Comment: Performed at [facility] [address] [place], [place] [address]  Comprehensive metabolic  panel     Status: Abnormal   Collection Time: 09/17/20 11:14 AM  Result Value Ref Range   Sodium 137 135 - 145 mmol/L   Potassium 4.0 3.5 - 5.1 mmol/L   Chloride 108 98 - 111 mmol/L   CO2 21 (L) 22 - 32 mmol/L   Glucose, Bld 158 (H) 70 - 99 mg/dL    Comment: Glucose reference range applies only to samples taken after fasting for at least 8 hours.   BUN 13 8 - 23 mg/dL   Creatinine, Ser 0.75 0.44 - 1.00 mg/dL   Calcium 8.3 (L) 8.9 - 10.3 mg/dL   Total Protein 6.0 (L) 6.5 - 8.1 g/dL   Albumin 2.3 (L) 3.5 - 5.0 g/dL   AST 27 15 - 41 U/L   ALT 24 0 - 44 U/L   Alkaline Phosphatase 106 38 - 126 U/L   Total Bilirubin 0.8 0.3 - 1.2 mg/dL   GFR, Estimated >60 >60 mL/min    Comment: (NOTE) Calculated using the CKD-EPI Creatinine Equation (2021)    Anion gap 8 5 - 15    Comment: Performed at [facility], [place] [address] [place], [place] [address]  Magnesium     Status: None   Collection Time: 09/17/20 11:14 AM  Result Value Ref Range   Magnesium 1.9 1.7 - 2.4 mg/dL    Comment: Performed at [facility] [address] [place],  [address]  Phosphorus     Status: Abnormal   Collection Time: 09/17/20 11:14 AM  Result Value Ref Range   Phosphorus 2.2 (L) 2.5 - 4.6 mg/dL    Comment: Performed at [facility] [address], [place], [place] [address]  CBC     Status: Abnormal   Collection Time: 09/17/20 11:14 AM  Result Value Ref Range   WBC 12.6 (H) 4.0 - 10.5 K/uL   RBC 3.27 (L) 3.87 - 5.11 MIL/uL   Hemoglobin 10.2 (L) 12.0 - 15.0 g/dL   HCT 30.7 (L) 36.0 - 46.0 %   MCV 93.9 80.0 - 100.0 fL   MCH 31.2 26.0 - 34.0 pg   MCHC 33.2 30.0 - 36.0 g/dL   RDW 13.6  11.5 - 15.5 %   Platelets 384 150 - 400 K/uL   nRBC 0.0 0.0 - 0.2 %    Comment: Performed at [facility], [place] [address] [place], [place] [address]  Glucose, capillary     Status: Abnormal   Collection Time: 09/17/20 11:45 AM  Result Value Ref Range   Glucose-Capillary 165 (H) 70 - 99 mg/dL     Comment: Glucose reference range applies only to samples taken after fasting for at least 8 hours.  Glucose, capillary     Status: Abnormal   Collection Time: 09/17/20  4:01 PM  Result Value Ref Range   Glucose-Capillary 174 (H) 70 - 99 mg/dL    Comment: Glucose reference range applies only to samples taken after fasting for at least 8 hours.  Glucose, capillary     Status: Abnormal   Collection Time: 09/17/20  9:26 PM  Result Value Ref Range   Glucose-Capillary 278 (H) 70 - 99 mg/dL    Comment: Glucose reference range applies only to samples taken after fasting for at least 8 hours.   Comment 1 Notify RN    Comment 2 Document in Chart   CBC     Status: Abnormal   Collection Time: 09/18/20  2:01 AM  Result Value Ref Range   WBC 11.3 (H) 4.0 - 10.5 K/uL   RBC 3.14 (L) 3.87 - 5.11 MIL/uL   Hemoglobin 9.6 (L) 12.0 - 15.0 g/dL   HCT 29.9 (L) 36.0 - 46.0 %   MCV 95.2 80.0 - 100.0 fL   MCH 30.6 26.0 - 34.0 pg   MCHC 32.1 30.0 - 36.0 g/dL   RDW 14.0 11.5 - 15.5 %   Platelets 365 150 - 400 K/uL   nRBC 0.0 0.0 - 0.2 %    Comment: Performed at [facility], [place] [address], [place], [place] [address]  Comprehensive metabolic panel     Status: Abnormal   Collection Time: 09/18/20  2:01 AM  Result Value Ref Range   Sodium 140 135 - 145 mmol/L   Potassium 3.7 3.5 - 5.1 mmol/L   Chloride 111 98 - 111 mmol/L   CO2 22 22 - 32 mmol/L   Glucose, Bld 170 (H) 70 - 99 mg/dL    Comment: Glucose reference range applies only to samples taken after fasting for at least 8 hours.   BUN 10 8 - 23 mg/dL   Creatinine, Ser 0.64 0.44 - 1.00 mg/dL   Calcium 8.2 (L) 8.9 - 10.3 mg/dL   Total Protein 5.6 (L) 6.5 - 8.1 g/dL   Albumin 2.2 (L) 3.5 - 5.0 g/dL   AST 27 15 - 41 U/L   ALT 28 0 - 44 U/L   Alkaline Phosphatase 101 38 - 126 U/L   Total Bilirubin 0.5 0.3 - 1.2 mg/dL   GFR, Estimated >60 >60 mL/min    Comment: (NOTE) Calculated using the CKD-EPI Creatinine Equation (2021)    Anion gap 7 5 -  15    Comment: Performed at [facility], [place] [address] [place], [place] [address]  Magnesium     Status: None   Collection Time: 09/18/20  2:01 AM  Result Value Ref Range   Magnesium 2.1 1.7 - 2.4 mg/dL    Comment: Performed at [facility] [address], [place], [place] [address]  Glucose, capillary     Status: Abnormal   Collection Time: 09/18/20  6:25 AM  Result Value Ref  Range   Glucose-Capillary 140 (H) 70 - 99 mg/dL    Comment: Glucose reference range applies only to samples taken after fasting for at least 8 hours.   Comment 1 Notify RN    Comment 2 Document in Chart   Glucose, capillary     Status: Abnormal   Collection Time: 09/18/20 11:30 AM  Result Value Ref Range   Glucose-Capillary 223 (H) 70 - 99 mg/dL    Comment: Glucose reference range applies only to samples taken after fasting for at least 8 hours.   Comment 1 Notify RN    Comment 2 Document in Chart   ECHOCARDIOGRAM COMPLETE     Status: None   Collection Time: 09/18/20  4:57 PM  Result Value Ref Range   Weight 2,850.11 oz   Height 68 in   BP 140/66 mmHg   S' Lateral 3.10 cm   AR max vel 2.45 cm2   AV Area VTI 2.24 cm2   AV Mean grad 5.0 mmHg   AV Peak grad 8.9 mmHg   Ao pk vel 1.49 m/s   Area-P 1/2 4.96 cm2   AV Area mean vel 2.33 cm2  Glucose, capillary     Status: Abnormal   Collection Time: 09/18/20  5:06 PM  Result Value Ref Range   Glucose-Capillary 151 (H) 70 - 99 mg/dL    Comment: Glucose reference range applies only to samples taken after fasting for at least 8 hours.  Glucose, capillary     Status: Abnormal   Collection Time: 09/18/20  9:01 PM  Result Value Ref Range   Glucose-Capillary 188 (H) 70 - 99 mg/dL    Comment: Glucose reference range applies only to samples taken after fasting for at least 8 hours.   Comment 1 Notify RN    Comment 2 Document in Chart   CBC     Status: Abnormal   Collection Time: 09/19/20  2:54 AM  Result Value Ref Range   WBC 11.3 (H) 4.0 - 10.5  K/uL   RBC 3.09 (L) 3.87 - 5.11 MIL/uL   Hemoglobin 9.3 (L) 12.0 - 15.0 g/dL   HCT 28.8 (L) 36.0 - 46.0 %   MCV 93.2 80.0 - 100.0 fL   MCH 30.1 26.0 - 34.0 pg   MCHC 32.3 30.0 - 36.0 g/dL   RDW 14.0 11.5 - 15.5 %   Platelets 403 (H) 150 - 400 K/uL   nRBC 0.0 0.0 - 0.2 %    Comment: Performed at [facility], [place] [address] [place], [place] [address]  Comprehensive metabolic panel     Status: Abnormal   Collection Time: 09/19/20  2:54 AM  Result Value Ref Range   Sodium 138 135 - 145 mmol/L   Potassium 3.6 3.5 - 5.1 mmol/L   Chloride 104 98 - 111 mmol/L   CO2 26 22 - 32 mmol/L   Glucose, Bld 126 (H) 70 - 99 mg/dL    Comment: Glucose reference range applies only to samples taken after fasting for at least 8 hours.   BUN 8 8 - 23 mg/dL   Creatinine, Ser 0.53 0.44 - 1.00 mg/dL   Calcium 8.2 (L) 8.9 - 10.3 mg/dL   Total Protein 5.5 (L) 6.5 - 8.1 g/dL   Albumin 2.0 (L) 3.5 - 5.0 g/dL   AST 25 15 - 41 U/L   ALT 28 0 - 44 U/L   Alkaline Phosphatase 105 38 - 126 U/L   Total Bilirubin 0.4 0.3 -  1.2 mg/dL   GFR, Estimated >60 >60 mL/min    Comment: (NOTE) Calculated using the CKD-EPI Creatinine Equation (2021)    Anion gap 8 5 - 15    Comment: Performed at [facility], [place] [address], [place] [place], [place] [address]  Magnesium     Status: None   Collection Time: 09/19/20  2:54 AM  Result Value Ref Range   Magnesium 1.8 1.7 - 2.4 mg/dL    Comment: Performed at [facility] [address] [place], [place] [address]  Procalcitonin - Baseline     Status: None   Collection Time: 09/19/20  2:54 AM  Result Value Ref Range   Procalcitonin <0.10 ng/mL    Comment:        Interpretation: PCT (Procalcitonin) <= 0.5 ng/mL: Systemic infection (sepsis) is not likely. Local bacterial infection is possible. (NOTE)       Sepsis PCT Algorithm           Lower Respiratory Tract                                      Infection PCT Algorithm    ----------------------------      ----------------------------         PCT < 0.25 ng/mL                PCT < 0.10 ng/mL          Strongly encourage             Strongly discourage   discontinuation of antibiotics    initiation of antibiotics    ----------------------------     -----------------------------       PCT 0.25 - 0.50 ng/mL            PCT 0.10 - 0.25 ng/mL               OR       >80% decrease in PCT            Discourage initiation of                                            antibiotics      Encourage discontinuation           of antibiotics    ----------------------------     -----------------------------         PCT >= 0.50 ng/mL              PCT 0.26 - 0.50 ng/mL               AND        <80% decrease in PCT             Encourage initiation of                                             antibiotics       Encourage continuation           of antibiotics    ----------------------------     -----------------------------        PCT >= 0.50 ng/mL  PCT > 0.50 ng/mL               AND         increase in PCT                  Strongly encourage                                      initiation of antibiotics    Strongly encourage escalation           of antibiotics                                     -----------------------------                                           PCT <= 0.25 ng/mL                                                 OR                                        > 80% decrease in PCT                                      Discontinue / Do not initiate                                             antibiotics  Performed at [facility], [address], [place], [place] [address]   Glucose, capillary     Status: Abnormal   Collection Time: 09/19/20  6:27 AM  Result Value Ref Range   Glucose-Capillary 124 (H) 70 - 99 mg/dL    Comment: Glucose reference range applies only to samples taken after fasting for at least 8 hours.   Comment 1 Notify RN    Comment 2 Document in Chart    Glucose, capillary     Status: Abnormal   Collection Time: 09/19/20  7:33 AM  Result Value Ref Range   Glucose-Capillary 117 (H) 70 - 99 mg/dL    Comment: Glucose reference range applies only to samples taken after fasting for at least 8 hours.  Urinalysis, Routine w reflex microscopic Urine, Clean Catch     Status: Abnormal   Collection Time: 09/19/20 11:29 AM  Result Value Ref Range   Color, Urine AMBER (A) YELLOW    Comment: BIOCHEMICALS MAY BE AFFECTED BY COLOR   APPearance HAZY (A) CLEAR   Specific Gravity, Urine 1.008 1.005 - 1.030   pH 6.0 5.0 - 8.0   Glucose, UA NEGATIVE NEGATIVE mg/dL   Hgb urine dipstick MODERATE (A) NEGATIVE   Bilirubin Urine NEGATIVE NEGATIVE   Ketones, ur NEGATIVE NEGATIVE mg/dL   Protein, ur NEGATIVE  NEGATIVE mg/dL   Nitrite NEGATIVE NEGATIVE   Leukocytes,Ua MODERATE (A) NEGATIVE   RBC / HPF 6-10 0 - 5 RBC/hpf   WBC, UA 11-20 0 - 5 WBC/hpf   Bacteria, UA RARE (A) NONE SEEN    Comment: Performed at [facility], [place] [address], [place], [place] [address]  Expectorated Sputum Assessment w Gram Stain, Rflx to Resp Cult     Status: None   Collection Time: 09/19/20 11:37 AM   Specimen: Expectorated Sputum  Result Value Ref Range   Specimen Description EXPECTORATED SPUTUM    Special Requests Normal    Sputum evaluation      Sputum specimen not acceptable for testing.  Please recollect.   Gram Stain Report Called to,Read Back By and Verified With: RN A ROBERTS AT 2028 09/19/2020 BY L BENFIELD Performed at [facility], [place] [address] [place], [place] [address]    Report Status 09/19/2020 FINAL   Glucose, capillary     Status: Abnormal   Collection Time: 09/19/20 12:04 PM  Result Value Ref Range   Glucose-Capillary 153 (H) 70 - 99 mg/dL    Comment: Glucose reference range applies only to samples taken after fasting for at least 8 hours.  Culture, blood (Routine X 2) w Reflex to ID Panel     Status: None   Collection Time: 09/19/20 12:12  PM   Specimen: BLOOD  Result Value Ref Range   Specimen Description BLOOD RIGHT ANTECUBITAL    Special Requests      BOTTLES DRAWN AEROBIC AND ANAEROBIC Blood Culture results may not be optimal due to an inadequate volume of blood received in culture bottles   Culture      NO GROWTH 5 DAYS Performed at [facility] [address] [place], [place] [address]    Report Status 09/24/2020 FINAL   Culture, blood (Routine X 2) w Reflex to ID Panel     Status: None   Collection Time: 09/19/20 12:12 PM   Specimen: BLOOD RIGHT HAND  Result Value Ref Range   Specimen Description BLOOD RIGHT HAND    Special Requests      BOTTLES DRAWN AEROBIC ONLY Blood Culture results may not be optimal due to an inadequate volume of blood received in culture bottles   Culture      NO GROWTH 5 DAYS Performed at [facility] [address], [place], [place] [address]    Report Status 09/24/2020 FINAL   Glucose, capillary     Status: Abnormal   Collection Time: 09/19/20  4:16 PM  Result Value Ref Range   Glucose-Capillary 137 (H) 70 - 99 mg/dL    Comment: Glucose reference range applies only to samples taken after fasting for at least 8 hours.  Glucose, capillary     Status: Abnormal   Collection Time: 09/19/20  8:27 PM  Result Value Ref Range   Glucose-Capillary 198 (H) 70 - 99 mg/dL    Comment: Glucose reference range applies only to samples taken after fasting for at least 8 hours.  CBC     Status: Abnormal   Collection Time: 09/20/20  3:43 AM  Result Value Ref Range   WBC 9.9 4.0 - 10.5 K/uL   RBC 3.27 (L) 3.87 - 5.11 MIL/uL   Hemoglobin 10.1 (L) 12.0 - 15.0 g/dL   HCT 31.0 (L) 36.0 - 46.0 %   MCV 94.8 80.0 - 100.0 fL   MCH 30.9 26.0 - 34.0 pg  MCHC 32.6 30.0 - 36.0 g/dL   RDW 13.7 11.5 - 15.5 %   Platelets 412 (H) 150 - 400 K/uL   nRBC 0.0 0.0 - 0.2 %    Comment: Performed at [facility], [place] [address], [place], [place] [address]  Comprehensive metabolic panel      Status: Abnormal   Collection Time: 09/20/20  3:43 AM  Result Value Ref Range   Sodium 139 135 - 145 mmol/L   Potassium 3.5 3.5 - 5.1 mmol/L   Chloride 102 98 - 111 mmol/L   CO2 26 22 - 32 mmol/L   Glucose, Bld 115 (H) 70 - 99 mg/dL    Comment: Glucose reference range applies only to samples taken after fasting for at least 8 hours.   BUN 10 8 - 23 mg/dL   Creatinine, Ser 0.64 0.44 - 1.00 mg/dL   Calcium 8.6 (L) 8.9 - 10.3 mg/dL   Total Protein 5.7 (L) 6.5 - 8.1 g/dL   Albumin 2.1 (L) 3.5 - 5.0 g/dL   AST 30 15 - 41 U/L   ALT 31 0 - 44 U/L   Alkaline Phosphatase 100 38 - 126 U/L   Total Bilirubin 0.3 0.3 - 1.2 mg/dL   GFR, Estimated >60 >60 mL/min    Comment: (NOTE) Calculated using the CKD-EPI Creatinine Equation (2021)    Anion gap 11 5 - 15    Comment: Performed at [facility], [place] [address], [place], [place] [address]  Magnesium     Status: None   Collection Time: 09/20/20  3:43 AM  Result Value Ref Range   Magnesium 1.9 1.7 - 2.4 mg/dL    Comment: Performed at [facility] [address] [place], [place] [address]  Glucose, capillary     Status: Abnormal   Collection Time: 09/20/20  5:55 AM  Result Value Ref Range   Glucose-Capillary 119 (H) 70 - 99 mg/dL    Comment: Glucose reference range applies only to samples taken after fasting for at least 8 hours.  Glucose, capillary     Status: Abnormal   Collection Time: 09/20/20 11:23 AM  Result Value Ref Range   Glucose-Capillary 120 (H) 70 - 99 mg/dL    Comment: Glucose reference range applies only to samples taken after fasting for at least 8 hours.  Strep pneumoniae urinary antigen     Status: None   Collection Time: 09/20/20 11:45 AM  Result Value Ref Range   Strep Pneumo Urinary Antigen NEGATIVE NEGATIVE    Comment:        Infection due to S. pneumoniae cannot be absolutely ruled out since the antigen present may be below the detection limit of the test. Performed at [facility], [address], [place], [place] [address]   Glucose, capillary     Status: Abnormal   Collection Time: 09/20/20  3:44 PM  Result Value Ref Range   Glucose-Capillary 152 (H) 70 - 99 mg/dL    Comment: Glucose reference range applies only to samples taken after fasting for at least 8 hours.  Culture, Urine     Status: None   Collection Time: 09/20/20  4:40 PM   Specimen: Urine, Random  Result Value Ref Range   Specimen Description URINE, RANDOM    Special Requests NONE    Culture      NO GROWTH Performed at [facility], [address], [place], [place] [address]    Report Status  09/22/2020 FINAL   Glucose, capillary     Status: Abnormal   Collection Time: 09/20/20  9:10 PM  Result Value Ref Range   Glucose-Capillary 207 (H) 70 - 99 mg/dL    Comment: Glucose reference range applies only to samples taken after fasting for at least 8 hours.   Comment 1 Notify RN    Comment 2 Document in Chart   CBC     Status: Abnormal   Collection Time: 09/21/20  3:59 AM  Result Value Ref Range   WBC 12.1 (H) 4.0 - 10.5 K/uL   RBC 3.64 (L) 3.87 - 5.11 MIL/uL   Hemoglobin 11.4 (L) 12.0 - 15.0 g/dL   HCT 34.3 (L) 36.0 - 46.0 %   MCV 94.2 80.0 - 100.0 fL   MCH 31.3 26.0 - 34.0 pg   MCHC 33.2 30.0 - 36.0 g/dL   RDW 13.6 11.5 - 15.5 %   Platelets 482 (H) 150 - 400 K/uL   nRBC 0.0 0.0 - 0.2 %    Comment: Performed at [facility], [place] [address] [place], [place] [address]  Magnesium     Status: None   Collection Time: 09/21/20  3:59 AM  Result Value Ref Range   Magnesium 2.1 1.7 - 2.4 mg/dL    Comment: Performed at [facility] [address] [place], [place] [address]  Glucose, capillary     Status: Abnormal   Collection Time: 09/21/20  6:35 AM  Result Value Ref Range   Glucose-Capillary 113 (H) 70 - 99 mg/dL    Comment: Glucose reference range applies only to samples taken after fasting for at least 8 hours.  Glucose, capillary     Status: Abnormal   Collection Time: 09/21/20  11:10 AM  Result Value Ref Range   Glucose-Capillary 181 (H) 70 - 99 mg/dL    Comment: Glucose reference range applies only to samples taken after fasting for at least 8 hours.    No results found.     All questions at time of visit were answered - patient instructed to contact office with any additional concerns or updates. ER/RTC precautions were reviewed with the patient as applicable.   Please note: manual typing as well as voice recognition software may have been used to produce this document - typos may escape review. Please contact Dr. Sheppard Coil for any needed clarifications.

## 2020-10-25 NOTE — Progress Notes (Signed)
MEDICARE ANNUAL WELLNESS VISIT  10/25/2020  Telephone Visit Disclaimer This Medicare AWV was conducted by telephone due to national recommendations for restrictions regarding the COVID-19 Pandemic (e.g. social distancing).  I verified, using two identifiers, that I am speaking with Kelli [name] or their authorized healthcare agent. I discussed the limitations, risks, security, and privacy concerns of performing an evaluation and management service by telephone and the potential availability of an in-person appointment in the future. The patient expressed understanding and agreed to proceed.  Location of Patient: Home Location of Provider (nurse):  In the office.  Subjective:    Kelli [name] is a 75 y.o. female patient of Emeterio Reeve, DO who had a Medicare Annual Wellness Visit today via telephone. Dymond is Retired and lives alone. she has 0 children. she reports that she is socially active and does interact with friends/family regularly. she is minimally physically active and enjoys spending time with her sister.  Patient Care Team: Emeterio Reeve, DO as PCP - General (Osteopathic Medicine) Annia Belt, MD as Consulting Physician (Oncology) Milus Banister, MD as Attending Physician (Gastroenterology) [name], MD as Consulting Physician (Oncology)  Advanced Directives 10/25/2020 09/17/2020 09/16/2020 08/02/2020 04/12/2020 04/12/2020 10/29/2018  Does Patient Have a Medical Advance Directive? Yes - No Yes Yes Yes Yes  Type of Paramedic of [place];Living will - - [place];Living will [place];Living will [place];Living will [place];Living will  Does patient want to make changes to medical advance directive? No - Patient declined - - - No - Patient declined No - Patient declined -  Copy of [place] in Chart? Yes - validated most recent  copy scanned in chart (See row information) - - No - copy requested Yes - validated most recent copy scanned in chart (See row information) No - copy requested Yes - validated most recent copy scanned in chart (See row information)  Would patient like information on creating a medical advance directive? - No - Patient declined - - - - [facility] An Utilization Over the Past 12 Months: # of hospitalizations or ER visits: 1 # of surgeries: 0  Review of Systems    Patient reports that her overall health is unchanged compared to last year.  History obtained from chart review and the patient  Patient Reported Readings (BP, Pulse, CBG, Weight, etc) none  Pain Assessment Pain : 0-10 Pain Score: 8  Pain Type: Chronic pain Pain Location: Back Pain Orientation: Lower Pain Descriptors / Indicators: Sharp Pain Onset: More than a month ago Pain Frequency: Constant Pain Relieving Factors: patient states "not standing up". Effect of Pain on Daily Activities: Standing up.  Pain Relieving Factors: patient states "not standing up".  Current Medications & Allergies (verified) Allergies as of 10/25/2020      Reactions   Atorvastatin Other (See Comments)   Muscle cramps   Glipizide Er Other (See Comments)   Shaking / tingling in fingers       Medication List       Accurate as of Oct 25, 2020  2:29 PM. If you have any questions, ask your nurse or doctor.        AMBULATORY NON FORMULARY MEDICATION Single glucometer with lancets, test strips. Test daily.  Disp qs 3 months E11.9   B-complex with vitamin C tablet Take 1 tablet by mouth daily.   B-D UF III MINI PEN NEEDLES  31G X 5 MM Misc Generic drug: Insulin Pen Needle USE AS DIRECTED   Basaglar KwikPen 100 UNIT/ML 10 UNITS AT BED INCREASE BY 2 UNITS EVERY 3 DAYS TIL FASTING SUGAR GOAL 120-150 MAX 60 UNITS PER DAY What changed: See the new instructions.   cyclobenzaprine 10 MG tablet Commonly known as: FLEXERIL Take 0.5-1  tablets (5-10 mg total) by mouth 3 (three) times daily as needed for muscle spasms.   FLUoxetine 40 MG capsule Commonly known as: PROZAC Take 1 capsule (40 mg total) by mouth daily.   HYDROcodone-acetaminophen 5-325 MG tablet Commonly known as: NORCO/VICODIN Take 1-2 tablets by mouth every 6 (six) hours as needed for severe pain. Started by: Emeterio Reeve, DO   OneTouch Delica Lancets 16X Misc TEST BLOOD SUGARS DAILY   OneTouch Verio test strip Generic drug: glucose blood TEST BLOOD SUGARS DAILY   pravastatin 80 MG tablet Commonly known as: PRAVACHOL TAKE 1 TABLET BY MOUTH EVERY DAY   Vitamin D 50 MCG (2000 UT) tablet Take 2,000 Units by mouth daily.       History (reviewed): Past Medical History:  Diagnosis Date  . Anxiety   . Arthritis   . Borderline diabetic   . Colon cancer ([place])    colon ca dx 02/2009, lung cancer  . Depression   . Depression with anxiety 09/19/2011  . H/O colon cancer, stage II 09/19/2011   T3N0  Cecum Sept 2010  . High cholesterol   . Hyperlipidemia 09/19/2011  . Lung nodules 09/19/2011   granulomas  . Urinary urgency    Past Surgical History:  Procedure Laterality Date  . APPENDECTOMY    . AUGMENTATION MAMMAPLASTY    . BREAST SURGERY  1978   augmentation  . COLON SURGERY     2010  . CRYO INTERCOSTAL NERVE BLOCK N/A 10/05/2014   Procedure: CRYO INTERCOSTAL NERVE BLOCK;  Surgeon: [name], MD;  Location: [place];  Service: Thoracic;  Laterality: N/A;  . LYMPH NODE DISSECTION Right 10/05/2014   Procedure: LYMPH NODE DISSECTION;  Surgeon: [name], MD;  Location: [place];  Service: Thoracic;  Laterality: Right;  . ORIF HUMERUS FRACTURE Left 10/01/2018   Procedure: OPEN REDUCTION INTERNAL FIXATION (ORIF) LEFT PROXIMAL HUMERUS FRACTURE;  Surgeon: [name], MD;  Location: [place];  Service: Orthopedics;  Laterality: Left;  . PARTIAL COLECTOMY Right 2010  . SEGMENTECOMY Right 10/05/2014   Procedure: right lower lobe  SEGMENTECTOMY;  Surgeon: [name], MD;  Location: [place];  Service: Thoracic;  Laterality: Right;  [name] VIDEO ASSISTED THORACOSCOPY (VATS)/WEDGE RESECTION Right 10/05/2014   Procedure: Right VIDEO ASSISTED THORACOSCOPY (VATS) with right lower lobe lung nodule WEDGE RESECTION;  Surgeon: [name], MD;  Location: [place];  Service: Thoracic;  Laterality: Right;   Family History  Problem Relation Age of Onset  . Heart disease Mother   . Colon polyps Sister   . Heart disease Brother   . Colon cancer Neg Hx   . Esophageal cancer Neg Hx   . Stomach cancer Neg Hx   . Rectal cancer Neg Hx    Social History   Socioeconomic History  . Marital status: Divorced    Spouse name: Not on file  . Number of children: 0  . Years of education: 33  . Highest education level: Some college, no degree  Occupational History    Comment: Retired.  Tobacco Use  . Smoking status: Former Smoker    Packs/day: 3.00    Years:  40.00    Pack years: 120.00    Types: Cigarettes    Quit date: 02/06/2009    Years since quitting: 11.7  . Smokeless tobacco: Never Used  Vaping Use  . Vaping Use: Never used  Substance and Sexual Activity  . Alcohol use: Yes    Alcohol/week: 0.0 standard drinks    Comment: 3-4 times a week 1-2 beers  . Drug use: No  . Sexual activity: Never    Birth control/protection: Post-menopausal  Other Topics Concern  . Not on file  Social History Narrative   Lives alone. She likes to spend time with her sister.    Social Determinants of Health   Financial Resource Strain: Low Risk   . Difficulty of Paying Living Expenses: Not hard at all  Food Insecurity: No Food Insecurity  . Worried About Charity fundraiser in the Last Year: Never true  . Ran Out of Food in the Last Year: Never true  Transportation Needs: No Transportation Needs  . Lack of Transportation (Medical): No  . Lack of Transportation (Non-Medical): No  Physical Activity: Inactive  . Days of Exercise per  Week: 0 days  . Minutes of Exercise per Session: 0 min  Stress: No Stress Concern Present  . Feeling of Stress : Not at all  Social Connections: Socially Isolated  . Frequency of Communication with Friends and Family: More than three times a week  . Frequency of Social Gatherings with Friends and Family: Three times a week  . Attends Religious Services: Never  . Active Member of Clubs or Organizations: No  . Attends Archivist Meetings: Never  . Marital Status: Divorced    Activities of Daily Living In your present state of health, do you have any difficulty performing the following activities: 10/25/2020 09/17/2020  Hearing? Y N  Comment sometimes. -  Vision? N N  Difficulty concentrating or making decisions? N N  Walking or climbing stairs? N N  Dressing or bathing? N N  Doing errands, shopping? N N  Preparing Food and eating ? N -  Using the Toilet? N -  In the past six months, have you accidently leaked urine? Y -  Comment patient has had urgency but has been doing well since the last month since she is on muscle relaxer -  Do you have problems with loss of bowel control? N -  Managing your Medications? N -  Managing your Finances? N -  Housekeeping or managing your Housekeeping? N -  Some recent data might be hidden    Patient Education/ Literacy How often do you need to have someone help you when you read instructions, pamphlets, or other written materials from your doctor or pharmacy?: 1 - Never What is the last grade level you completed in school?: 12th grade.  Exercise Current Exercise Habits: Home exercise routine, Type of exercise: walking, Time (Minutes): 10, Frequency (Times/Week): 1, Weekly Exercise (Minutes/Week): 10, Intensity: Mild, Exercise limited by: orthopedic condition(s)  Diet Patient reports consuming 2.5 meals a day and 1-2 snack(s) a day Patient reports that her primary diet is: Regular Patient reports that she does have regular access to  food.   Depression Screen PHQ 2/9 Scores 10/25/2020 04/01/2020 12/11/2018 06/12/2018 11/29/2017 02/26/2017 09/05/2016  PHQ - 2 Score 0 1 1 2 2 1 1   PHQ- 9 Score - - 6 3 8 6  -     Fall Risk Fall Risk  10/25/2020 04/01/2020 03/09/2016 12/29/2014 06/17/2014  Falls in the past  year? 1 1 Yes No No  Number falls in past yr: 0 1 1 - -  Injury with Fall? 0 0 No - -  Risk for fall due to : No Fall Risks - - - -  Follow up Falls evaluation completed;Education provided - - - -     Objective:  Kelli [name] seemed alert and oriented and she participated appropriately during our telephone visit.  Blood Pressure Weight BMI  BP Readings from Last 3 Encounters:  10/25/20 (!) 124/54  10/20/20 138/72  09/30/20 136/71   Wt Readings from Last 3 Encounters:  10/25/20 177 lb (80.3 kg)  10/20/20 176 lb (79.8 kg)  09/30/20 174 lb 1.3 oz (79 kg)   BMI Readings from Last 1 Encounters:  10/25/20 26.91 kg/m    *Unable to obtain current vital signs, weight, and BMI due to telephone visit type  Hearing/Vision  . Kenyatta did not seem to have difficulty with hearing/understanding during the telephone conversation . Reports that she has had a formal eye exam by an eye care professional within the past year . Reports that she has not had a formal hearing evaluation within the past year *Unable to fully assess hearing and vision during telephone visit type  Cognitive Function: 6CIT Screen 10/25/2020  What Year? 0 points  What month? 0 points  What time? 0 points  Count back from 20 0 points  Months in reverse 0 points  Repeat phrase 0 points  Total Score 0   (Normal:0-7, Significant for Dysfunction: >8)  Normal Cognitive Function Screening: Yes   Immunization & Health Maintenance Record Immunization History  Administered Date(s) Administered  . Fluad Quad(high Dose 65+) 03/13/2019, 04/01/2020  . Influenza,inj,Quad PF,6+ Mos 03/20/2018  . PFIZER(Purple Top)SARS-COV-2 Vaccination 08/08/2019, 09/03/2019,  04/12/2020  . Pneumococcal Conjugate-13 08/22/2016  . Pneumococcal-Unspecified 02/03/2009  . Tdap 10/02/2018    Health Maintenance  Topic Date Due  . FOOT EXAM  10/26/2020 (Originally 01/20/1956)  . URINE MICROALBUMIN  10/26/2020 (Originally 09/11/2019)  . COVID-19 Vaccine (4 - Booster for Pfizer series) 11/10/2020 (Originally 07/13/2020)  . PNA vac Low Risk Adult (2 of 2 - PPSV23) 07/06/2021 (Originally 08/22/2017)  . DEXA SCAN  10/25/2021 (Originally 01/20/2013)  . INFLUENZA VACCINE  01/03/2021  . OPHTHALMOLOGY EXAM  02/02/2021  . HEMOGLOBIN A1C  03/18/2021  . MAMMOGRAM  12/24/2021  . COLONOSCOPY (Pts 45-43yrs Insurance coverage will need to be confirmed)  01/29/2025  . TETANUS/TDAP  10/01/2028  . Hepatitis C Screening  Completed  . HPV VACCINES  Aged Out       Assessment  This is a routine wellness examination for NAN MAYA.  Health Maintenance: Due or Overdue There are no preventive care reminders to display for this patient.  Kelli [name] does not need a referral for Community Assistance: Care Management:   no Social Work:    no Prescription Assistance:  no Nutrition/Diabetes Education:  no   Plan:  Personalized Goals Goals Addressed              This Visit's Progress   .  Patient Stated (pt-stated)        10/25/2020 AWV Goal: Exercise for General Health   Patient will verbalize understanding of the benefits of increased physical activity:  Exercising regularly is important. It will improve your overall fitness, flexibility, and endurance.  Regular exercise also will improve your overall health. It can help you control your weight, reduce stress, and improve your bone density.  Over the next  year, patient will increase physical activity as tolerated with a goal of at least 150 minutes of moderate physical activity per week.   You can tell that you are exercising at a moderate intensity if your heart starts beating faster and you start breathing faster but  can still hold a conversation.  Moderate-intensity exercise ideas include:  Walking 1 mile (1.6 km) in about 15 minutes  Biking  Hiking  Golfing  Dancing  Water aerobics  Patient will verbalize understanding of everyday activities that increase physical activity by providing examples like the following: ? Yard work, such as: ? Pushing a Conservation officer, nature ? Raking and bagging leaves ? Washing your car ? Pushing a stroller ? Shoveling snow ? Gardening ? Washing windows or floors  Patient will be able to explain general safety guidelines for exercising:   Before you start a new exercise program, talk with your health care provider.  Do not exercise so much that you hurt yourself, feel dizzy, or get very short of breath.  Wear comfortable clothes and wear shoes with good support.  Drink plenty of water while you exercise to prevent dehydration or heat stroke.  Work out until your breathing and your heartbeat get faster.       Personalized Health Maintenance & Screening Recommendations  Pneumococcal vaccine  Screening mammography Bone densitometry screening Shingrix Foot exam Urine ACR  Lung Cancer Screening Recommended: no (Low Dose CT Chest recommended if Age 57-80 years, 30 pack-year currently smoking OR have quit w/in past 15 years) Hepatitis C Screening recommended: no HIV Screening recommended: no  Advanced Directives: Written information was not prepared per patient's request.  Referrals & Orders Orders Placed This Encounter  Procedures  . [place]    Follow-up Plan . Follow-up with Emeterio Reeve, DO as planned . Pneumonia vaccine, Foot exam and Urine ACR can be done at your next in-office visit.  . Schedule your shingrix vaccine at your pharmacy.  . Dexa scan referral has been sent.  . Medicare wellness visit in one year.   I have personally reviewed and noted the following in the patient's chart:   . Medical and social history . Use of alcohol,  tobacco or illicit drugs  . Current medications and supplements . Functional ability and status . Nutritional status . Physical activity . Advanced directives . List of other physicians . Hospitalizations, surgeries, and ER visits in previous 12 months . Vitals . Screenings to include cognitive, depression, and falls . Referrals and appointments  In addition, I have reviewed and discussed with Kelli [name] certain preventive protocols, quality metrics, and best practice recommendations. A written personalized care plan for preventive services as well as general preventive health recommendations is available and can be mailed to the patient at her request.      Tinnie Gens, RN  10/25/2020

## 2020-10-25 NOTE — Patient Instructions (Addendum)
[place] Maintenance Summary and Written Plan of Care  Ms. Kelli [name] ,  Thank you for allowing me to perform your Medicare Annual Wellness Visit and for your ongoing commitment to your health.   Health Maintenance & Immunization History Health Maintenance  Topic Date Due  . FOOT EXAM  10/26/2020 (Originally 01/20/1956)  . URINE MICROALBUMIN  10/26/2020 (Originally 09/11/2019)  . COVID-19 Vaccine (4 - Booster for Pfizer series) 11/10/2020 (Originally 07/13/2020)  . PNA vac Low Risk Adult (2 of 2 - PPSV23) 07/06/2021 (Originally 08/22/2017)  . DEXA SCAN  10/25/2021 (Originally 01/20/2013)  . INFLUENZA VACCINE  01/03/2021  . OPHTHALMOLOGY EXAM  02/02/2021  . HEMOGLOBIN A1C  03/18/2021  . MAMMOGRAM  12/24/2021  . COLONOSCOPY (Pts 45-3yrs Insurance coverage will need to be confirmed)  01/29/2025  . TETANUS/TDAP  10/01/2028  . Hepatitis C Screening  Completed  . HPV VACCINES  Aged Out   Immunization History  Administered Date(s) Administered  . Fluad Quad(high Dose 65+) 03/13/2019, 04/01/2020  . Influenza,inj,Quad PF,6+ Mos 03/20/2018  . PFIZER(Purple Top)SARS-COV-2 Vaccination 08/08/2019, 09/03/2019, 04/12/2020  . Pneumococcal Conjugate-13 08/22/2016  . Pneumococcal-Unspecified 02/03/2009  . Tdap 10/02/2018    These are the patient goals that we discussed: Goals Addressed              This Visit's Progress   .  Patient Stated (pt-stated)        10/25/2020 AWV Goal: Exercise for General Health   Patient will verbalize understanding of the benefits of increased physical activity:  Exercising regularly is important. It will improve your overall fitness, flexibility, and endurance.  Regular exercise also will improve your overall health. It can help you control your weight, reduce stress, and improve your bone density.  Over the next year, patient will increase physical activity as tolerated with a goal of at least 150 minutes of moderate physical  activity per week.   You can tell that you are exercising at a moderate intensity if your heart starts beating faster and you start breathing faster but can still hold a conversation.  Moderate-intensity exercise ideas include:  Walking 1 mile (1.6 km) in about 15 minutes  Biking  Hiking  Golfing  Dancing  Water aerobics  Patient will verbalize understanding of everyday activities that increase physical activity by providing examples like the following: ? Yard work, such as: ? Pushing a Conservation officer, nature ? Raking and bagging leaves ? Washing your car ? Pushing a stroller ? Shoveling snow ? Gardening ? Washing windows or floors  Patient will be able to explain general safety guidelines for exercising:   Before you start a new exercise program, talk with your health care provider.  Do not exercise so much that you hurt yourself, feel dizzy, or get very short of breath.  Wear comfortable clothes and wear shoes with good support.  Drink plenty of water while you exercise to prevent dehydration or heat stroke.  Work out until your breathing and your heartbeat get faster.         This is a list of Health Maintenance Items that are overdue or due now: Pneumococcal vaccine  Screening mammography Bone densitometry screening Shingrix Foot exam Urine ACR  Orders/Referrals Placed Today: Orders Placed This Encounter  Procedures  . DEXAScan    Standing Status:   Future    Standing Expiration Date:   10/25/2021    Scheduling Instructions:     Please call patient to schedule.    Order Specific Question:  Reason for exam:    Answer:   Post menopausal    Order Specific Question:   Preferred imaging location?    Answer:   [facility]   (Contact our referral department at [phone] if you have not spoken with someone about your referral appointment within the next 5 days)    Follow-up Plan . Follow-up with Emeterio Reeve, DO as planned . Pneumonia vaccine,  Foot exam and Urine ACR can be done at your next in-office visit.  . Schedule your shingrix vaccine at your pharmacy.  . Dexa scan referral has been sent.  . Medicare wellness visit in one year.    Bone Density Test A bone density test uses a type of X-ray to measure the amount of calcium and other minerals in a person's bones. It can measure bone density in the hip and the spine. The test is similar to having a regular X-ray. This test may also be called:  Bone densitometry.  Bone mineral density test.  Dual-energy X-ray absorptiometry (DEXA). You may have this test to:  Diagnose a condition that causes weak or thin bones (osteoporosis).  Screen you for osteoporosis.  Predict your risk for a broken bone (fracture).  Determine how well your osteoporosis treatment is working. Tell a health care provider about:  Any allergies you have.  All medicines you are taking, including vitamins, herbs, eye drops, creams, and over-the-counter medicines.  Any problems you or family members have had with anesthetic medicines.  Any blood disorders you have.  Any surgeries you have had.  Any medical conditions you have.  Whether you are pregnant or may be pregnant.  Any medical tests you have had within the past 14 days that used contrast material. What are the risks? Generally, this is a safe test. However, it does expose you to a small amount of radiation, which can slightly increase your cancer risk. What happens before the test?  Do not take any calcium supplements within the 24 hours before your test.  You will need to remove all metal jewelry, eyeglasses, removable dental appliances, and any other metal objects on your body. What happens during the test?  You will lie down on an exam table. There will be an X-ray generator below you and an imaging device above you.  Other devices, such as boxes or braces, may be used to position your body properly for the scan.  The machine  will slowly scan your body. You will need to keep very still while the machine does the scan.  The images will show up on a screen in the room. Images will be examined by a specialist after your test is finished. The procedure may vary among health care providers and hospitals.   What can I expect after the test? It is up to you to get the results of your test. Ask your health care provider, or the department that is doing the test, when your results will be ready. Summary  A bone density test is an imaging test that uses a type of X-ray to measure the amount of calcium and other minerals in your bones.  The test may be used to diagnose or screen you for a condition that causes weak or thin bones (osteoporosis), predict your risk for a broken bone (fracture), or determine how well your osteoporosis treatment is working.  Do not take any calcium supplements within 24 hours before your test.  Ask your health care provider, or the department that is doing the  test, when your results will be ready. This information is not intended to replace advice given to you by your health care provider. Make sure you discuss any questions you have with your health care provider. Document Revised: 11/06/2019 Document Reviewed: 11/06/2019 Elsevier Patient Education  [place] Maintenance, Female Adopting a healthy lifestyle and getting preventive care are important in promoting health and wellness. Ask your health care provider about:  The right schedule for you to have regular tests and exams.  Things you can do on your own to prevent diseases and keep yourself healthy. What should I know about diet, weight, and exercise? Eat a healthy diet  Eat a diet that includes plenty of vegetables, fruits, low-fat dairy products, and lean protein.  Do not eat a lot of foods that are high in solid fats, added sugars, or sodium.   Maintain a healthy weight Body mass index (BMI) is used to  identify weight problems. It estimates body fat based on height and weight. Your health care provider can help determine your BMI and help you achieve or maintain a healthy weight. Get regular exercise Get regular exercise. This is one of the most important things you can do for your health. Most adults should:  Exercise for at least 150 minutes each week. The exercise should increase your heart rate and make you sweat (moderate-intensity exercise).  Do strengthening exercises at least twice a week. This is in addition to the moderate-intensity exercise.  Spend less time sitting. Even light physical activity can be beneficial. Watch cholesterol and blood lipids Have your blood tested for lipids and cholesterol at 73 years of age, then have this test every 5 years. Have your cholesterol levels checked more often if:  Your lipid or cholesterol levels are high.  You are older than 73 years of age.  You are at high risk for heart disease. What should I know about cancer screening? Depending on your health history and family history, you may need to have cancer screening at various ages. This may include screening for:  Breast cancer.  Cervical cancer.  Colorectal cancer.  Skin cancer.  Lung cancer. What should I know about heart disease, diabetes, and high blood pressure? Blood pressure and heart disease  High blood pressure causes heart disease and increases the risk of stroke. This is more likely to develop in people who have high blood pressure readings, are of African descent, or are overweight.  Have your blood pressure checked: ? Every 3-5 years if you are 92-35 years of age. ? Every year if you are 90 years old or older. Diabetes Have regular diabetes screenings. This checks your fasting blood sugar level. Have the screening done:  Once every three years after age 25 if you are at a normal weight and have a low risk for diabetes.  More often and at a younger age if you  are overweight or have a high risk for diabetes. What should I know about preventing infection? Hepatitis B If you have a higher risk for hepatitis B, you should be screened for this virus. Talk with your health care provider to find out if you are at risk for hepatitis B infection. Hepatitis C Testing is recommended for:  Everyone born from [birth year] through 1963.  Anyone with known risk factors for hepatitis C. Sexually transmitted infections (STIs)  Get screened for STIs, including gonorrhea and chlamydia, if: ? You are sexually active and are younger than 73 years of age. ?  You are older than 73 years of age and your health care provider tells you that you are at risk for this type of infection. ? Your sexual activity has changed since you were last screened, and you are at increased risk for chlamydia or gonorrhea. Ask your health care provider if you are at risk.  Ask your health care provider about whether you are at high risk for HIV. Your health care provider may recommend a prescription medicine to help prevent HIV infection. If you choose to take medicine to prevent HIV, you should first get tested for HIV. You should then be tested every 3 months for as long as you are taking the medicine. Pregnancy  If you are about to stop having your period (premenopausal) and you may become pregnant, seek counseling before you get pregnant.  Take 400 to 800 micrograms (mcg) of folic acid every day if you become pregnant.  Ask for birth control (contraception) if you want to prevent pregnancy. Osteoporosis and menopause Osteoporosis is a disease in which the bones lose minerals and strength with aging. This can result in bone fractures. If you are 64 years old or older, or if you are at risk for osteoporosis and fractures, ask your health care provider if you should:  Be screened for bone loss.  Take a calcium or vitamin D supplement to lower your risk of fractures.  Be given hormone  replacement therapy (HRT) to treat symptoms of menopause. Follow these instructions at home: Lifestyle  Do not use any products that contain nicotine or tobacco, such as cigarettes, e-cigarettes, and chewing tobacco. If you need help quitting, ask your health care provider.  Do not use street drugs.  Do not share needles.  Ask your health care provider for help if you need support or information about quitting drugs. Alcohol use  Do not drink alcohol if: ? Your health care provider tells you not to drink. ? You are pregnant, may be pregnant, or are planning to become pregnant.  If you drink alcohol: ? Limit how much you use to 0-1 drink a day. ? Limit intake if you are breastfeeding.  Be aware of how much alcohol is in your drink. In the [place] one drink equals one 12 oz bottle of beer (355 mL), one 5 oz glass of wine (148 mL), or one 1 oz glass of hard liquor (44 mL). General instructions  Schedule regular health, dental, and eye exams.  Stay current with your vaccines.  Tell your health care provider if: ? You often feel depressed. ? You have ever been abused or do not feel safe at home. Summary  Adopting a healthy lifestyle and getting preventive care are important in promoting health and wellness.  Follow your health care provider's instructions about healthy diet, exercising, and getting tested or screened for diseases.  Follow your health care provider's instructions on monitoring your cholesterol and blood pressure. This information is not intended to replace advice given to you by your health care provider. Make sure you discuss any questions you have with your health care provider. Document Revised: 05/15/2018 Document Reviewed: 05/15/2018 Elsevier Patient Education  2021 [employer].

## 2020-10-26 LAB — DM TEMPLATE

## 2020-10-26 LAB — DRUG MONITOR, PANEL 5, SCREEN, URINE
Amphetamines: NEGATIVE ng/mL (ref <500)
Barbiturates: NEGATIVE ng/mL (ref <300)
Benzodiazepines: NEGATIVE ng/mL (ref <100)
Cocaine Metabolite: NEGATIVE ng/mL (ref <150)
Creatinine: 176.5 mg/dL
Marijuana Metabolite: POSITIVE ng/mL — AB (ref <20)
Methadone Metabolite: NEGATIVE ng/mL (ref <100)
Opiates: NEGATIVE ng/mL (ref <100)
Oxidant: NEGATIVE ug/mL
Oxycodone: NEGATIVE ng/mL (ref <100)
pH: 5.2 (ref 4.5–9.0)

## 2020-11-03 ENCOUNTER — Other Ambulatory Visit: Payer: Medicare HMO

## 2020-11-07 ENCOUNTER — Encounter: Payer: Self-pay | Admitting: Osteopathic Medicine

## 2020-11-08 ENCOUNTER — Ambulatory Visit (INDEPENDENT_AMBULATORY_CARE_PROVIDER_SITE_OTHER): Payer: Medicare HMO

## 2020-11-08 ENCOUNTER — Ambulatory Visit (INDEPENDENT_AMBULATORY_CARE_PROVIDER_SITE_OTHER): Payer: Medicare HMO | Admitting: Family Medicine

## 2020-11-08 ENCOUNTER — Other Ambulatory Visit: Payer: Self-pay

## 2020-11-08 ENCOUNTER — Encounter: Payer: Self-pay | Admitting: Family Medicine

## 2020-11-08 VITALS — BP 134/82 | HR 92 | Temp 98.1°F | Resp 18

## 2020-11-08 DIAGNOSIS — R509 Fever, unspecified: Secondary | ICD-10-CM

## 2020-11-08 DIAGNOSIS — M940 Chondrocostal junction syndrome [Tietze]: Secondary | ICD-10-CM

## 2020-11-08 DIAGNOSIS — R059 Cough, unspecified: Secondary | ICD-10-CM | POA: Diagnosis not present

## 2020-11-08 DIAGNOSIS — J189 Pneumonia, unspecified organism: Secondary | ICD-10-CM

## 2020-11-08 DIAGNOSIS — J90 Pleural effusion, not elsewhere classified: Secondary | ICD-10-CM | POA: Diagnosis not present

## 2020-11-08 NOTE — Patient Instructions (Signed)
Labs and Xray today. May need CT scan tomorrow - we will notify you if needed.

## 2020-11-08 NOTE — Telephone Encounter (Signed)
Patient has been scheduled with Caleen Jobs this afternoon. AM

## 2020-11-08 NOTE — Progress Notes (Signed)
Acute Office Visit  Subjective:    Patient ID: Kelli [name], female    DOB: June 17, 1945, 75 y.o.   MRN: [id number]  Chief Complaint  Patient presents with  . Cough    HPI Patient is in today for fever/chills.   On Friday night patient experienced chills and fever of 102 degrees, fatigue, body aches, nausea, nonproductive cough, and rhinorrhea, sore throat.  She has not had any chest pain or trouble breathing or wheezing.  Denies urinary symptoms, blood in urine/stool.  She has noticed some soreness to her lower chest/epigastric region to palpation and posterior lung soreness with movement following frequent coughing.  States she feels almost as bad as she did in April when she was hospitalized.  She has been forcing herself to get out of bed and stay hydrated.  Temperatures have remained around 100 over the weekend.  Not elevated today, however she did take 2 Tylenol prior to coming to appointment.  She had a negative home COVID test.     Past Medical History:  Diagnosis Date  . Anxiety   . Arthritis   . Borderline diabetic   . Colon cancer ([place])    colon ca dx 02/2009, lung cancer  . Depression   . Depression with anxiety 09/19/2011  . H/O colon cancer, stage II 09/19/2011   T3N0  Cecum Sept 2010  . High cholesterol   . Hyperlipidemia 09/19/2011  . Lung nodules 09/19/2011   granulomas  . Urinary urgency     Past Surgical History:  Procedure Laterality Date  . APPENDECTOMY    . AUGMENTATION MAMMAPLASTY    . BREAST SURGERY  1978   augmentation  . COLON SURGERY     2010  . CRYO INTERCOSTAL NERVE BLOCK N/A 10/05/2014   Procedure: CRYO INTERCOSTAL NERVE BLOCK;  Surgeon: [name], MD;  Location: [place];  Service: Thoracic;  Laterality: N/A;  . LYMPH NODE DISSECTION Right 10/05/2014   Procedure: LYMPH NODE DISSECTION;  Surgeon: [name], MD;  Location: [place];  Service: Thoracic;  Laterality: Right;  . ORIF HUMERUS FRACTURE Left 10/01/2018   Procedure:  OPEN REDUCTION INTERNAL FIXATION (ORIF) LEFT PROXIMAL HUMERUS FRACTURE;  Surgeon: [name], MD;  Location: [place];  Service: Orthopedics;  Laterality: Left;  . PARTIAL COLECTOMY Right 2010  . SEGMENTECOMY Right 10/05/2014   Procedure: right lower lobe SEGMENTECTOMY;  Surgeon: [name], MD;  Location: [place];  Service: Thoracic;  Laterality: Right;  [name] VIDEO ASSISTED THORACOSCOPY (VATS)/WEDGE RESECTION Right 10/05/2014   Procedure: Right VIDEO ASSISTED THORACOSCOPY (VATS) with right lower lobe lung nodule WEDGE RESECTION;  Surgeon: [name], MD;  Location: [place];  Service: Thoracic;  Laterality: Right;    Family History  Problem Relation Age of Onset  . Heart disease Mother   . Colon polyps Sister   . Heart disease Brother   . Colon cancer Neg Hx   . Esophageal cancer Neg Hx   . Stomach cancer Neg Hx   . Rectal cancer Neg Hx     Social History   Socioeconomic History  . Marital status: Divorced    Spouse name: Not on file  . Number of children: 0  . Years of education: 39  . Highest education level: Some college, no degree  Occupational History    Comment: Retired.  Tobacco Use  . Smoking status: Former Smoker    Packs/day: 3.00    Years: 40.00    Pack years: 120.00  Types: Cigarettes    Quit date: 02/06/2009    Years since quitting: 11.7  . Smokeless tobacco: Never Used  Vaping Use  . Vaping Use: Never used  Substance and Sexual Activity  . Alcohol use: Yes    Alcohol/week: 0.0 standard drinks    Comment: 3-4 times a week 1-2 beers  . Drug use: No  . Sexual activity: Never    Birth control/protection: Post-menopausal  Other Topics Concern  . Not on file  Social History Narrative   Lives alone. She likes to spend time with her sister.    Social Determinants of Health   Financial Resource Strain: Low Risk   . Difficulty of Paying Living Expenses: Not hard at all  Food Insecurity: No Food Insecurity  . Worried About Charity fundraiser  in the Last Year: Never true  . Ran Out of Food in the Last Year: Never true  Transportation Needs: No Transportation Needs  . Lack of Transportation (Medical): No  . Lack of Transportation (Non-Medical): No  Physical Activity: Inactive  . Days of Exercise per Week: 0 days  . Minutes of Exercise per Session: 0 min  Stress: No Stress Concern Present  . Feeling of Stress : Not at all  Social Connections: Socially Isolated  . Frequency of Communication with Friends and Family: More than three times a week  . Frequency of Social Gatherings with Friends and Family: Three times a week  . Attends Religious Services: Never  . Active Member of Clubs or Organizations: No  . Attends Archivist Meetings: Never  . Marital Status: Divorced  Human resources officer Violence: Not At Risk  . Fear of Current or Ex-Partner: No  . Emotionally Abused: No  . Physically Abused: No  . Sexually Abused: No    Outpatient Medications Prior to Visit  Medication Sig Dispense Refill  . AMBULATORY NON FORMULARY MEDICATION Single glucometer with lancets, test strips. Test daily.  Disp qs 3 months E11.9 1 each 0  . B Complex-C (B-COMPLEX WITH VITAMIN C) tablet Take 1 tablet by mouth daily.    . B-D UF III MINI PEN NEEDLES 31G X 5 MM MISC USE AS DIRECTED 100 each 12  . Cholecalciferol (VITAMIN D) 2000 UNITS tablet Take 2,000 Units by mouth daily.    . cyclobenzaprine (FLEXERIL) 10 MG tablet Take 0.5-1 tablets (5-10 mg total) by mouth 3 (three) times daily as needed for muscle spasms. 45 tablet 0  . FLUoxetine (PROZAC) 40 MG capsule Take 1 capsule (40 mg total) by mouth daily. 90 capsule 3  . HYDROcodone-acetaminophen (NORCO/VICODIN) 5-325 MG tablet Take 1-2 tablets by mouth every 6 (six) hours as needed for severe pain. 30 tablet 0  . Insulin Glargine (BASAGLAR KWIKPEN) 100 UNIT/ML 10 UNITS AT BED INCREASE BY 2 UNITS EVERY 3 DAYS TIL FASTING SUGAR GOAL 120-150 MAX 60 UNITS PER DAY (Patient taking differently:  Inject 35 Units into the skin at bedtime.) 3 mL 11  . OneTouch Delica Lancets 91P MISC TEST BLOOD SUGARS DAILY 100 each 0  . ONETOUCH VERIO test strip TEST BLOOD SUGARS DAILY 100 strip 1  . pravastatin (PRAVACHOL) 80 MG tablet TAKE 1 TABLET BY MOUTH EVERY DAY 90 tablet 3   No facility-administered medications prior to visit.    Allergies  Allergen Reactions  . Atorvastatin Other (See Comments)    Muscle cramps  . Glipizide Er Other (See Comments)    Shaking / tingling in fingers     Review of Systems  All review of systems negative except what is listed in the HPI     Objective:    Physical Exam Constitutional:      General: She is not in acute distress.    Appearance: Normal appearance.  HENT:     Head: Normocephalic and atraumatic.  Cardiovascular:     Rate and Rhythm: Regular rhythm.     Heart sounds: Normal heart sounds.  Pulmonary:     Effort: Pulmonary effort is normal. No respiratory distress.     Comments: Right lung sounds diminished, fine crackles Abdominal:     General: There is no distension.     Palpations: Abdomen is soft.     Tenderness: There is no abdominal tenderness.  Musculoskeletal:     Cervical back: Normal range of motion and neck supple.  Skin:    General: Skin is warm and dry.     Findings: No rash.  Neurological:     General: No focal deficit present.     Mental Status: She is alert and oriented to person, place, and time.  Psychiatric:        Mood and Affect: Mood normal.        Behavior: Behavior normal.        Thought Content: Thought content normal.        Judgment: Judgment normal.     BP 134/82   Pulse 92   Temp 98.1 F (36.7 C)   Resp 18   LMP  (LMP Unknown)   SpO2 97%  Wt Readings from Last 3 Encounters:  10/25/20 177 lb (80.3 kg)  10/20/20 176 lb (79.8 kg)  09/30/20 174 lb 1.3 oz (79 kg)    Health Maintenance Due  Topic Date Due  . Pneumococcal Vaccine 81-82 Years old (1 of 4 - PCV13) Never done  . FOOT EXAM  Never  done  . Zoster Vaccines- Shingrix (1 of 2) Never done  . URINE MICROALBUMIN  09/11/2019    There are no preventive care reminders to display for this patient.   Lab Results  Component Value Date   TSH 3.63 03/12/2019   Lab Results  Component Value Date   WBC 12.1 (H) 09/21/2020   HGB 11.4 (L) 09/21/2020   HCT 34.3 (L) 09/21/2020   MCV 94.2 09/21/2020   PLT 482 (H) 09/21/2020   Lab Results  Component Value Date   NA 139 09/20/2020   K 3.5 09/20/2020   CHLORIDE 106 03/20/2017   CO2 26 09/20/2020   GLUCOSE 115 (H) 09/20/2020   BUN 10 09/20/2020   CREATININE 0.64 09/20/2020   BILITOT 0.3 09/20/2020   ALKPHOS 100 09/20/2020   AST 30 09/20/2020   ALT 31 09/20/2020   PROT 5.7 (L) 09/20/2020   ALBUMIN 2.1 (L) 09/20/2020   CALCIUM 8.6 (L) 09/20/2020   ANIONGAP 11 09/20/2020   EGFR >60 03/20/2017   Lab Results  Component Value Date   CHOL 259 (H) 03/29/2020   Lab Results  Component Value Date   HDL 41 (L) 03/29/2020   Lab Results  Component Value Date   LDLCALC 179 (H) 03/29/2020   Lab Results  Component Value Date   TRIG 229 (H) 03/29/2020   Lab Results  Component Value Date   CHOLHDL 6.3 (H) 03/29/2020   Lab Results  Component Value Date   HGBA1C 8.2 (H) 09/16/2020       Assessment & Plan:   1. Cough 2. Fever, unspecified fever cause  Due to her recent pulmonary  issues, would like to repeat imaging - will start with CXR today and consider CT scan pending results. (Most recent CT scan last month showed improvement from hospitalization, but did show some nodules which she will follow with pulmonology and repeat CT scheduled for about 3 months out). CBC and CMP ordered also as these have not been repeated since hospitalization. Concern for PNA vs pleural effusion as she had both recently. Will update patient with results and plan of care. Patient aware of signs/symptoms requiring further/urgent evaluation.  - DG Chest 2 View; Future - CBC with  Differential - COMPLETE METABOLIC PANEL WITH GFR  Follow-up pending results or sooner if needed.   Case discussed with Dr. Sheppard Coil.  Terrilyn Saver, NP

## 2020-11-09 LAB — CBC WITH DIFFERENTIAL/PLATELET
Absolute Monocytes: 566 cells/uL (ref 200–950)
Basophils Absolute: 20 cells/uL (ref 0–200)
Basophils Relative: 0.4 %
Eosinophils Absolute: 61 cells/uL (ref 15–500)
Eosinophils Relative: 1.2 %
HCT: 40.6 % (ref 35.0–45.0)
Hemoglobin: 13.1 g/dL (ref 11.7–15.5)
Lymphs Abs: 1795 cells/uL (ref 850–3900)
MCH: 29.8 pg (ref 27.0–33.0)
MCHC: 32.3 g/dL (ref 32.0–36.0)
MCV: 92.5 fL (ref 80.0–100.0)
MPV: 10.8 fL (ref 7.5–12.5)
Monocytes Relative: 11.1 %
Neutro Abs: 2657 cells/uL (ref 1500–7800)
Neutrophils Relative %: 52.1 %
Platelets: 298 10*3/uL (ref 140–400)
RBC: 4.39 10*6/uL (ref 3.80–5.10)
RDW: 13.4 % (ref 11.0–15.0)
Total Lymphocyte: 35.2 %
WBC: 5.1 10*3/uL (ref 3.8–10.8)

## 2020-11-09 LAB — COMPLETE METABOLIC PANEL WITH GFR
AG Ratio: 1.6 (calc) (ref 1.0–2.5)
ALT: 16 U/L (ref 6–29)
AST: 19 U/L (ref 10–35)
Albumin: 4.3 g/dL (ref 3.6–5.1)
Alkaline phosphatase (APISO): 87 U/L (ref 37–153)
BUN: 15 mg/dL (ref 7–25)
CO2: 26 mmol/L (ref 20–32)
Calcium: 9.3 mg/dL (ref 8.6–10.4)
Chloride: 106 mmol/L (ref 98–110)
Creat: 0.79 mg/dL (ref 0.60–0.93)
GFR, Est African American: 87 mL/min/{1.73_m2} (ref >=60)
GFR, Est Non African American: 75 mL/min/{1.73_m2} (ref >=60)
Globulin: 2.7 g/dL (calc) (ref 1.9–3.7)
Glucose, Bld: 262 mg/dL — ABNORMAL HIGH (ref 65–99)
Potassium: 4 mmol/L (ref 3.5–5.3)
Sodium: 142 mmol/L (ref 135–146)
Total Bilirubin: 0.2 mg/dL (ref 0.2–1.2)
Total Protein: 7 g/dL (ref 6.1–8.1)

## 2020-11-09 MED ORDER — LEVOFLOXACIN 750 MG PO TABS: 750.0000 mg | ORAL_TABLET | Freq: Every day | ORAL | 0 refills | Status: AC

## 2020-11-09 MED ORDER — BREO ELLIPTA 100-25 MCG/INH IN AEPB: 1.0000 | INHALATION_SPRAY | Freq: Every day | RESPIRATORY_TRACT | 11 refills | Status: DC

## 2020-11-09 MED ORDER — PREDNISONE 20 MG PO TABS: 40.0000 mg | ORAL_TABLET | Freq: Every day | ORAL | 0 refills | Status: AC

## 2020-11-09 NOTE — Progress Notes (Signed)
Sending in a short burst of prednisone to help with pain/inflammation likely caused by the coughing.

## 2020-11-09 NOTE — Progress Notes (Unsigned)
Your chest x-ray looked similar to your last which showed improvement compared to your hospitalization. I don't think a CT scan is needed at this time, but given your fevers and other symptoms, will go ahead and send in an antibiotic and inhaler for daily use.   Ordering Levaquin as she has recently been on other antibiotics and recent hospitalization. Adair Patter looks like it may be covered by her insurance so we will try this.  Strict instructions to follow-up if not improving or go to ED if symptoms become worse.

## 2020-11-09 NOTE — Progress Notes (Signed)
Lab work looks much better compared to your hospitalization! Glucose was elevated, but I know you had eaten prior to the appointment. We can keep an eye on this.

## 2020-11-10 ENCOUNTER — Other Ambulatory Visit: Payer: Self-pay

## 2020-11-10 ENCOUNTER — Ambulatory Visit (INDEPENDENT_AMBULATORY_CARE_PROVIDER_SITE_OTHER): Payer: Medicare HMO

## 2020-11-10 DIAGNOSIS — Z780 Asymptomatic menopausal state: Secondary | ICD-10-CM | POA: Diagnosis not present

## 2020-11-10 DIAGNOSIS — Z0000 Encounter for general adult medical examination without abnormal findings: Secondary | ICD-10-CM

## 2020-11-10 DIAGNOSIS — M810 Age-related osteoporosis without current pathological fracture: Secondary | ICD-10-CM | POA: Diagnosis not present

## 2020-11-10 DIAGNOSIS — M85852 Other specified disorders of bone density and structure, left thigh: Secondary | ICD-10-CM | POA: Diagnosis not present

## 2020-11-11 ENCOUNTER — Other Ambulatory Visit: Payer: Self-pay | Admitting: Osteopathic Medicine

## 2020-11-11 DIAGNOSIS — F418 Other specified anxiety disorders: Secondary | ICD-10-CM

## 2020-11-12 ENCOUNTER — Encounter: Payer: Self-pay | Admitting: Osteopathic Medicine

## 2020-11-12 DIAGNOSIS — M810 Age-related osteoporosis without current pathological fracture: Secondary | ICD-10-CM | POA: Insufficient documentation

## 2020-11-15 ENCOUNTER — Telehealth: Payer: Self-pay | Admitting: Pulmonary Disease

## 2020-11-15 DIAGNOSIS — R918 Other nonspecific abnormal finding of lung field: Secondary | ICD-10-CM

## 2020-11-15 DIAGNOSIS — Z85118 Personal history of other malignant neoplasm of bronchus and lung: Secondary | ICD-10-CM

## 2020-11-15 NOTE — Telephone Encounter (Signed)
ATC, left VM for callback. 

## 2020-11-16 NOTE — Telephone Encounter (Signed)
  Please let the patient know that her CT chest scan is much improved from her hospitalization but there are a couple nodules of the right lower lobe that I would like to monitor with a CT chest scan in 3 months. Please order her a CT chest nodule follow up scan to be done in late August or early September with afollow up visit with me soon after the scan. Thanks.  LMTCB

## 2020-11-23 NOTE — Telephone Encounter (Signed)
Called and spoke to pt. Informed her of the results and recs per Dr. Erin Fulling. Order placed for repeat CT chest. Pt verbalized understanding and denied any further questions or concerns at this time.

## 2020-11-24 DIAGNOSIS — H2513 Age-related nuclear cataract, bilateral: Secondary | ICD-10-CM | POA: Diagnosis not present

## 2020-11-24 DIAGNOSIS — H5213 Myopia, bilateral: Secondary | ICD-10-CM | POA: Diagnosis not present

## 2020-11-24 DIAGNOSIS — Z7984 Long term (current) use of oral hypoglycemic drugs: Secondary | ICD-10-CM | POA: Diagnosis not present

## 2020-11-24 DIAGNOSIS — E119 Type 2 diabetes mellitus without complications: Secondary | ICD-10-CM | POA: Diagnosis not present

## 2020-11-24 DIAGNOSIS — H524 Presbyopia: Secondary | ICD-10-CM | POA: Diagnosis not present

## 2020-11-24 LAB — HM DIABETES EYE EXAM

## 2020-12-03 ENCOUNTER — Telehealth: Payer: Self-pay | Admitting: Pulmonary Disease

## 2020-12-03 NOTE — Telephone Encounter (Signed)
I tried to call patient about phone call but there was no answer. Left detailed message stating I do not see where our office called since 11/23/20 regarding CT and we spoke with patient but left callback number in case needed anything else. Nothing further needed.

## 2020-12-16 ENCOUNTER — Other Ambulatory Visit: Payer: Self-pay | Admitting: Osteopathic Medicine

## 2020-12-21 ENCOUNTER — Other Ambulatory Visit: Payer: Self-pay | Admitting: Osteopathic Medicine

## 2020-12-22 ENCOUNTER — Other Ambulatory Visit: Payer: Self-pay | Admitting: Osteopathic Medicine

## 2020-12-22 ENCOUNTER — Telehealth: Payer: Self-pay | Admitting: Osteopathic Medicine

## 2020-12-22 MED ORDER — CLONIDINE HCL 0.1 MG PO TABS: 0.1000 mg | ORAL_TABLET | Freq: Every day | ORAL | 0 refills | Status: DC

## 2020-12-22 NOTE — Telephone Encounter (Signed)
Patient states that she is out of cloNIDine (CATAPRES) tablet 0.1 mg  . Has not been able to sleep. States that 10 tablets will hold her over until her appointment with PCP. Please advise. She will be going out of town.

## 2020-12-22 NOTE — Telephone Encounter (Signed)
Ok to fill x2 weeks.

## 2020-12-23 NOTE — Telephone Encounter (Signed)
Patient has been made aware. No further questions at this time.

## 2020-12-29 ENCOUNTER — Ambulatory Visit
Admission: RE | Admit: 2020-12-29 | Discharge: 2020-12-29 | Disposition: A | Discharge status: Home / Self Care | Payer: Medicare HMO | Source: Ambulatory Visit | Attending: Osteopathic Medicine | Admitting: Osteopathic Medicine

## 2020-12-29 ENCOUNTER — Other Ambulatory Visit: Payer: Self-pay | Admitting: Osteopathic Medicine

## 2020-12-29 ENCOUNTER — Other Ambulatory Visit: Payer: Self-pay

## 2020-12-29 DIAGNOSIS — R928 Other abnormal and inconclusive findings on diagnostic imaging of breast: Secondary | ICD-10-CM

## 2020-12-29 DIAGNOSIS — R922 Inconclusive mammogram: Secondary | ICD-10-CM | POA: Diagnosis not present

## 2020-12-31 ENCOUNTER — Ambulatory Visit (INDEPENDENT_AMBULATORY_CARE_PROVIDER_SITE_OTHER): Payer: Medicare HMO | Admitting: Osteopathic Medicine

## 2020-12-31 ENCOUNTER — Telehealth: Payer: Self-pay | Admitting: Osteopathic Medicine

## 2020-12-31 ENCOUNTER — Encounter: Payer: Self-pay | Admitting: Osteopathic Medicine

## 2020-12-31 ENCOUNTER — Other Ambulatory Visit: Payer: Self-pay

## 2020-12-31 VITALS — BP 120/67 | HR 86 | Ht 68.0 in | Wt 176.0 lb

## 2020-12-31 DIAGNOSIS — Z794 Long term (current) use of insulin: Secondary | ICD-10-CM

## 2020-12-31 DIAGNOSIS — G4700 Insomnia, unspecified: Secondary | ICD-10-CM | POA: Diagnosis not present

## 2020-12-31 DIAGNOSIS — R238 Other skin changes: Secondary | ICD-10-CM

## 2020-12-31 DIAGNOSIS — E119 Type 2 diabetes mellitus without complications: Secondary | ICD-10-CM | POA: Diagnosis not present

## 2020-12-31 DIAGNOSIS — M810 Age-related osteoporosis without current pathological fracture: Secondary | ICD-10-CM

## 2020-12-31 LAB — POCT GLYCOSYLATED HEMOGLOBIN (HGB A1C): HbA1c POC (<> result, manual entry): 8.6 %

## 2020-12-31 MED ORDER — CLONIDINE HCL 0.1 MG PO TABS: 0.1000 mg | ORAL_TABLET | Freq: Every day | ORAL | 1 refills | Status: DC

## 2020-12-31 MED ORDER — BETAMETHASONE DIPROPIONATE 0.05 % EX CREA: TOPICAL_CREAM | Freq: Two times a day (BID) | CUTANEOUS | 1 refills | Status: DC

## 2020-12-31 NOTE — Progress Notes (Signed)
Kelli [name] is a 75 y.o. female who presents to  [place] at [facility]  today, 12/31/20, seeking care for the following:  Rash on R arm, minor itching, not bad but she thinks she contacts some sort of irritant. On exam, skin a bit rough but no obvious rash. She has tried OTC cortisone Insomnia, requests refill clonidine, this  was helping a lot   DM2 follow-up A1C today 12/31/20: 8.5 - taking 35 units insulin daily btu she has not been exercising 09/16/20: 8.2 - up to 35 unitsBasaglar  06/30/20: 8.0 - 30 units insulin daily, advised titrate up on insulins to target FBG 03/29/20: 8.5 - exercising less, not eating as well since she's been caring for ill sister, advised titration up on basal insulin to target FBG 12/24/19: 6.2 - exercising more in the summer    [place] with other pertinent findings:  The primary encounter diagnosis was Type 2 diabetes mellitus without complication, with long-term current use of insulin ([place]). Diagnoses of Skin irritation and Insomnia, unspecified type were also pertinent to this visit.   1. Type 2 diabetes mellitus without complication, with long-term current use of insulin ([facility]) Pt will increase exercise, she was able to get A1C <7 when she was doing regular aerobic exericse in the past   2. Skin irritation c/w contact dermatiits  Rx topical steroid, RTC for reevaluatino if no better  3. Insomnia, unspecified type Refilled clonidine     Orders Placed This Encounter  Procedures   POCT HgB A1C    Meds ordered this encounter  Medications   betamethasone dipropionate 0.05 % cream    Sig: Apply topically 2 (two) times daily. To affected area(s) as needed    Dispense:  45 g    Refill:  1   cloNIDine (CATAPRES) 0.1 MG tablet    Sig: Take 1 tablet (0.1 mg total) by mouth daily.    Dispense:  90 tablet    Refill:  1     See below for relevant physical exam findings  See below for  recent lab and imaging results reviewed  Medications, allergies, PMH, PSH, SocH, FamH reviewed below    Follow-up instructions: Return in about 8 weeks (around 02/25/2021).                                        Exam:  BP 120/67   Pulse 86   Ht 5\' 8"  (1.727 m)   Wt 176 lb (79.8 kg)   LMP  (LMP Unknown)   BMI 26.76 kg/m  Constitutional: VS see above. General Appearance: alert, well-developed, well-nourished, NAD Neck: No masses, trachea midline.  Respiratory: Normal respiratory effort. no wheeze, no rhonchi, no rales Cardiovascular: S1/S2 normal, no murmur, no rub/gallop auscultated. RRR.  Musculoskeletal: Gait normal. Symmetric and independent movement of all extremities Abdominal: non-tender, non-distended, no appreciable organomegaly, neg Murphy's, BS WNLx4 Neurological: Normal balance/coordination. No tremor. Skin: warm, dry, intact.  Psychiatric: Normal judgment/insight. Normal mood and affect. Oriented x3.   Current Meds  Medication Sig   betamethasone dipropionate 0.05 % cream Apply topically 2 (two) times daily. To affected area(s) as needed    Allergies  Allergen Reactions   Atorvastatin Other (See Comments)    Muscle cramps   Glipizide Er Other (See Comments)    Shaking / tingling in fingers     Patient  Active Problem List   Diagnosis Date Noted   Osteoporosis 11/12/2020   Sepsis ([place]) 09/17/2020   Multifocal pneumonia 09/16/2020   Closed fracture of left proximal humerus 09/26/2018   Squamous cell carcinoma 03/05/2017   Echocardiogram shows left ventricular diastolic dysfunction 62/69/4854   Heart palpitations 07/25/2016   Need for vaccination for pneumococcus 07/13/2016   Osteoarthritis of hand, right 08/06/2015   Diabetes mellitus type 2, uncomplicated ([place]) 62/70/3500   Lung cancer, lower lobe ([place]) 10/09/2014   Hematochezia 04/01/2013   H/O colon cancer, stage II 09/19/2011   Depression with anxiety 09/19/2011    Hyperlipidemia 09/19/2011    Family History  Problem Relation Age of Onset   Heart disease Mother    Colon polyps Sister    Heart disease Brother    Colon cancer Neg Hx    Esophageal cancer Neg Hx    Stomach cancer Neg Hx    Rectal cancer Neg Hx     Social History   Tobacco Use  Smoking Status Former   Packs/day: 3.00   Years: 40.00   Pack years: 120.00   Types: Cigarettes   Quit date: 02/06/2009   Years since quitting: 11.9  Smokeless Tobacco Never    Past Surgical History:  Procedure Laterality Date   APPENDECTOMY     AUGMENTATION MAMMAPLASTY     BREAST SURGERY  1978   augmentation   COLON SURGERY     2010   CRYO INTERCOSTAL NERVE BLOCK N/A 10/05/2014   Procedure: CRYO INTERCOSTAL NERVE BLOCK;  Surgeon: [name], MD;  Location: [place];  Service: Thoracic;  Laterality: N/A;   LYMPH NODE DISSECTION Right 10/05/2014   Procedure: LYMPH NODE DISSECTION;  Surgeon: [name], MD;  Location: [facility] OR;  Service: Thoracic;  Laterality: Right;   ORIF HUMERUS FRACTURE Left 10/01/2018   Procedure: OPEN REDUCTION INTERNAL FIXATION (ORIF) LEFT PROXIMAL HUMERUS FRACTURE;  Surgeon: [name], MD;  Location: [place];  Service: Orthopedics;  Laterality: Left;   PARTIAL COLECTOMY Right 2010   SEGMENTECOMY Right 10/05/2014   Procedure: right lower lobe SEGMENTECTOMY;  Surgeon: [name], MD;  Location: [facility] OR;  Service: Thoracic;  Laterality: Right;   VIDEO ASSISTED THORACOSCOPY (VATS)/WEDGE RESECTION Right 10/05/2014   Procedure: Right VIDEO ASSISTED THORACOSCOPY (VATS) with right lower lobe lung nodule WEDGE RESECTION;  Surgeon: [name], MD;  Location: [place];  Service: Thoracic;  Laterality: Right;    Immunization History  Administered Date(s) Administered   Fluad Quad(high Dose 65+) 03/13/2019, 04/01/2020   Influenza,inj,Quad PF,6+ Mos 03/20/2018   PFIZER(Purple Top)SARS-COV-2 Vaccination 08/08/2019, 09/03/2019, 04/12/2020   Pneumococcal  Conjugate-13 08/22/2016   Pneumococcal-Unspecified 02/03/2009   Tdap 10/02/2018    Recent Results (from the past 2160 hour(s))  DRUG MONITOR, PANEL 5, SCREEN, URINE     Status: Abnormal   Collection Time: 10/25/20  1:19 PM  Result Value Ref Range   Amphetamines NEGATIVE <500 ng/mL    Comment: See Note A   Barbiturates NEGATIVE <300 ng/mL    Comment: See Note A   Benzodiazepines NEGATIVE <100 ng/mL    Comment: See Note A   Cocaine Metabolite NEGATIVE <150 ng/mL    Comment: See Note A   Marijuana Metabolite POSITIVE (A) <20 ng/mL    Comment: See Note A   Methadone Metabolite NEGATIVE <100 ng/mL    Comment: See Note A   Opiates NEGATIVE <100 ng/mL    Comment: See Note A   Oxycodone NEGATIVE <100 ng/mL  Comment: See Note A   Creatinine 176.5 mg/dL   pH 5.2 4.5 - 9.0   Oxidant NEGATIVE mcg/mL  DM TEMPLATE     Status: None   Collection Time: 10/25/20  1:19 PM  Result Value Ref Range   Notes and Comments      Comment: This drug testing is for medical treatment only. Analysis was performed as non-forensic testing and these results should be used only by healthcare providers to render diagnosis or treatment, or to monitor progress of medical conditions. . Note A: The results are presumptive; based only on screening methods, and they have not been confirmed by a definitive method. . . Healthcare Providers needing Interpretation assistance,  please contact us at [phone] ([phone])  M-F, 8am to 10pm EST   CBC with Differential     Status: None   Collection Time: 11/08/20 12:00 AM  Result Value Ref Range   WBC 5.1 3.8 - 10.8 Thousand/uL   RBC 4.39 3.80 - 5.10 Million/uL   Hemoglobin 13.1 11.7 - 15.5 g/dL   HCT 40.6 35.0 - 45.0 %   MCV 92.5 80.0 - 100.0 fL   MCH 29.8 27.0 - 33.0 pg   MCHC 32.3 32.0 - 36.0 g/dL   RDW 13.4 11.0 - 15.0 %   Platelets 298 140 - 400 Thousand/uL   MPV 10.8 7.5 - 12.5 fL   Neutro Abs 2,657 1,500 - 7,800 cells/uL   Lymphs Abs  1,795 850 - 3,900 cells/uL   Absolute Monocytes 566 200 - 950 cells/uL   Eosinophils Absolute 61 15 - 500 cells/uL   Basophils Absolute 20 0 - 200 cells/uL   Neutrophils Relative % 52.1 %   Total Lymphocyte 35.2 %   Monocytes Relative 11.1 %   Eosinophils Relative 1.2 %   Basophils Relative 0.4 %  COMPLETE METABOLIC PANEL WITH GFR     Status: Abnormal   Collection Time: 11/08/20 12:00 AM  Result Value Ref Range   Glucose, Bld 262 (H) 65 - 99 mg/dL    Comment: .            Fasting reference interval . For someone without known diabetes, a glucose value >125 mg/dL indicates that they may have diabetes and this should be confirmed with a follow-up test. .    BUN 15 7 - 25 mg/dL   Creat 0.79 0.60 - 0.93 mg/dL    Comment: For patients >8 years of age, the reference limit for Creatinine is approximately 13% higher for people identified as African-American. .    GFR, Est Non African American 75 > OR = 60 mL/min/1.32m2   GFR, Est African American 87 > OR = 60 mL/min/1.81m2   BUN/Creatinine Ratio NOT APPLICABLE 6 - 22 (calc)   Sodium 142 135 - 146 mmol/L   Potassium 4.0 3.5 - 5.3 mmol/L   Chloride 106 98 - 110 mmol/L   CO2 26 20 - 32 mmol/L   Calcium 9.3 8.6 - 10.4 mg/dL   Total Protein 7.0 6.1 - 8.1 g/dL   Albumin 4.3 3.6 - 5.1 g/dL   Globulin 2.7 1.9 - 3.7 g/dL (calc)   AG Ratio 1.6 1.0 - 2.5 (calc)   Total Bilirubin 0.2 0.2 - 1.2 mg/dL   Alkaline phosphatase (APISO) 87 37 - 153 U/L   AST 19 10 - 35 U/L   ALT 16 6 - 29 U/L  POCT HgB A1C     Status: None   Collection Time: 12/31/20 11:04 AM  Result Value  Ref Range   Hemoglobin A1C     HbA1c POC (<> result, manual entry) 8.6 4.0 - 5.6 %   HbA1c, POC (prediabetic range)     HbA1c, POC (controlled diabetic range)      US BREAST LTD UNI LEFT INC AXILLA  Result Date: 12/29/2020 CLINICAL DATA:  Short-term follow-up for a probably benign left breast mass. Patient had diagnostic imaging performed on 12/25/2019 as a recall  from screening for possible bilateral breast masses. The right mass was a benign cyst. The left mass was a probably benign finding, which was followed with ultrasound only on 06/28/2020, without change. No current breast complaints. EXAM: DIGITAL DIAGNOSTIC BILATERAL MAMMOGRAM WITH IMPLANTS, CAD AND TOMOSYNTHESIS; ULTRASOUND LEFT BREAST LIMITED TECHNIQUE: Bilateral digital diagnostic mammography and breast tomosynthesis was performed. The images were evaluated with computer-aided detection. Standard and/or implant displaced views were performed.; Targeted ultrasound examination of the left breast was performed COMPARISON:  Previous exam(s). ACR Breast Density Category b: There are scattered areas of fibroglandular density. FINDINGS: The previously noted right breast mass, found to be a cyst, is no longer visualized. The more subtle mass noted on the left is less well-defined on the current exam. There are no new masses, no areas of architectural distortion and no suspicious calcifications. The patient has prepectoral implants. Targeted left breast ultrasound is performed, showing a hypoechoic, oval, circumscribed parallel mass at 12 o'clock, 3 cm the nipple, measuring 7 x 3 x 5 mm, without change from the prior exams, stable for 1 year. IMPRESSION: 1. Probably benign left breast mass, most likely a fibroadenoma, stable for 1 year. No new abnormalities. RECOMMENDATION: Diagnostic mammography and left breast ultrasound in 1 year to provide 2 years of stability for the probably benign left breast mass. I have discussed the findings and recommendations with the patient. If applicable, a reminder letter will be sent to the patient regarding the next appointment. BI-RADS CATEGORY  3: Probably benign. Electronically Signed   By: Lajean Manes M.D.   On: 12/29/2020 14:08  MM DIAG BREAST W/IMPLANT TOMO BILATERAL  Result Date: 12/29/2020 CLINICAL DATA:  Short-term follow-up for a probably benign left breast mass. Patient  had diagnostic imaging performed on 12/25/2019 as a recall from screening for possible bilateral breast masses. The right mass was a benign cyst. The left mass was a probably benign finding, which was followed with ultrasound only on 06/28/2020, without change. No current breast complaints. EXAM: DIGITAL DIAGNOSTIC BILATERAL MAMMOGRAM WITH IMPLANTS, CAD AND TOMOSYNTHESIS; ULTRASOUND LEFT BREAST LIMITED TECHNIQUE: Bilateral digital diagnostic mammography and breast tomosynthesis was performed. The images were evaluated with computer-aided detection. Standard and/or implant displaced views were performed.; Targeted ultrasound examination of the left breast was performed COMPARISON:  Previous exam(s). ACR Breast Density Category b: There are scattered areas of fibroglandular density. FINDINGS: The previously noted right breast mass, found to be a cyst, is no longer visualized. The more subtle mass noted on the left is less well-defined on the current exam. There are no new masses, no areas of architectural distortion and no suspicious calcifications. The patient has prepectoral implants. Targeted left breast ultrasound is performed, showing a hypoechoic, oval, circumscribed parallel mass at 12 o'clock, 3 cm the nipple, measuring 7 x 3 x 5 mm, without change from the prior exams, stable for 1 year. IMPRESSION: 1. Probably benign left breast mass, most likely a fibroadenoma, stable for 1 year. No new abnormalities. RECOMMENDATION: Diagnostic mammography and left breast ultrasound in 1 year to provide 2 years of  stability for the probably benign left breast mass. I have discussed the findings and recommendations with the patient. If applicable, a reminder letter will be sent to the patient regarding the next appointment. BI-RADS CATEGORY  3: Probably benign. Electronically Signed   By: Lajean Manes M.D.   On: 12/29/2020 14:08      All questions at time of visit were answered - patient instructed to contact office  with any additional concerns or updates. ER/RTC precautions were reviewed with the patient as applicable.   Please note: manual typing as well as voice recognition software may have been used to produce this document - typos may escape review. Please contact Dr. Sheppard Coil for any needed clarifications.

## 2020-12-31 NOTE — Patient Instructions (Signed)
FYI to my patients: After six years here, I will be leaving practice at [facility]. My last day here will be 03/04/2021. I will continue to provide your care up until that date, and you will still be considered a patient here after that as long as you want to be!    After 03/04/2021, my patients have several options to continue care:  1) you can establish care with Dr. Luetta Nutting or Samuel Bouche NP, who are accepting new patients here and are absorbing many folks from my current patient panel, OR...  2) you can see any available provider here on as-needed basis until my official replacement starts (hiring a new doctor has not been finalized yet), OR.Marland KitchenMarland Kitchen 3) if you choose to seek care elsewhere, this office will be happy to facilitate transfer of records, and will refill medications on a case-by-case basis.   It is bittersweet to leave! 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 I have truly enjoyed taking care of folks here, but I am also excited for my next adventure doing something a bit different. Take care, and please let us know if you have any questions!   -Dr. Loni Muse.

## 2020-12-31 NOTE — Telephone Encounter (Signed)
Let's order (or see if insurance will cover) Prolia for osteoporosis

## 2021-01-18 ENCOUNTER — Other Ambulatory Visit: Payer: Self-pay

## 2021-01-18 ENCOUNTER — Ambulatory Visit (INDEPENDENT_AMBULATORY_CARE_PROVIDER_SITE_OTHER): Payer: Medicare HMO

## 2021-01-18 DIAGNOSIS — R911 Solitary pulmonary nodule: Secondary | ICD-10-CM | POA: Diagnosis not present

## 2021-01-18 DIAGNOSIS — Z85118 Personal history of other malignant neoplasm of bronchus and lung: Secondary | ICD-10-CM | POA: Diagnosis not present

## 2021-01-18 DIAGNOSIS — I700 Atherosclerosis of aorta: Secondary | ICD-10-CM | POA: Diagnosis not present

## 2021-01-18 DIAGNOSIS — J439 Emphysema, unspecified: Secondary | ICD-10-CM | POA: Diagnosis not present

## 2021-01-18 DIAGNOSIS — R918 Other nonspecific abnormal finding of lung field: Secondary | ICD-10-CM | POA: Diagnosis not present

## 2021-01-19 ENCOUNTER — Other Ambulatory Visit: Payer: Self-pay | Admitting: Osteopathic Medicine

## 2021-01-20 DIAGNOSIS — Z23 Encounter for immunization: Secondary | ICD-10-CM | POA: Diagnosis not present

## 2021-01-24 ENCOUNTER — Encounter: Payer: Self-pay | Admitting: Pulmonary Disease

## 2021-01-24 ENCOUNTER — Ambulatory Visit: Payer: Medicare HMO | Admitting: Pulmonary Disease

## 2021-01-24 ENCOUNTER — Other Ambulatory Visit: Payer: Self-pay

## 2021-01-24 VITALS — BP 100/60 | HR 81 | Temp 98.6°F | Ht 68.0 in | Wt 179.6 lb

## 2021-01-24 DIAGNOSIS — R918 Other nonspecific abnormal finding of lung field: Secondary | ICD-10-CM | POA: Diagnosis not present

## 2021-01-24 NOTE — Patient Instructions (Addendum)
We will message Dr. Benay Spice and recommend that your follow up visit and CT chest scan be moved out to February 2023.   Please message our clinic after you have your CT chest scan so that I can review the scan and then have you follow up Eric Form in our lung cancer screening program.

## 2021-01-24 NOTE — Progress Notes (Signed)
Synopsis: Referred in May 2022 for hospital follow up of pneumonia  Subjective:   PATIENT ID: Kelli [name] GENDER: female DOB: March 24, 1946, MRN: [id number]  HPI  Chief Complaint  Patient presents with   Follow-up    Follow up Ct scan   Maryjo Ragon is a 75 year old woman, former smoker with history of RLL lung adenocarcinoma s/p RLL basilar lobe segmentectomy and adenocarcinoma of the cecum who returns to pulmonary clinic for follow up of pneumonia.   Her breathing has returned to baseline and she denies any cough, dyspnea, wheezing or sputum production.   Follow up CT Chest scan on 8/16 shows complete resolution of her pneumonia from April 2022 and stability in her solid and ground glass nodules, less than 1cm.   OV 10/20/20 She was admitted 09/16/20 to 09/21/20 for community acquired pneumonia and right pleural effusion. She was treated with a 14 day course of antibiotics, initially treated with azithromycin and cefepime then transitioned to augmentin. She was discharged with a couple of inhalers but she is not currently using them.  Sputum sample from the admission was not acceptable for culture. Her strep pneumo urine ag was negative. She was negative for influenza and covid.  She reports feeling much better since discharge. She is coughing up phlegm occasionally but otherwise feels her breathing is well. She denies fevers, chills, weight loss or lack of appetite.    Past Medical History:  Diagnosis Date   Anxiety    Arthritis    Borderline diabetic    Colon cancer ([facility])    colon ca dx 02/2009, lung cancer   Depression    Depression with anxiety 09/19/2011   H/O colon cancer, stage II 09/19/2011   T3N0  Cecum Sept 2010   High cholesterol    Hyperlipidemia 09/19/2011   Lung nodules 09/19/2011   granulomas   Urinary urgency      Family History  Problem Relation Age of Onset   Heart disease Mother    Colon polyps Sister    Heart disease Brother    Colon cancer Neg Hx     Esophageal cancer Neg Hx    Stomach cancer Neg Hx    Rectal cancer Neg Hx      Social History   Socioeconomic History   Marital status: Divorced    Spouse name: Not on file   Number of children: 0   Years of education: 16   Highest education level: Some college, no degree  Occupational History    Comment: Retired.  Tobacco Use   Smoking status: Former    Packs/day: 3.00    Years: 40.00    Pack years: 120.00    Types: Cigarettes    Start date: 1962    Quit date: 02/06/2009    Years since quitting: 11.9   Smokeless tobacco: Never  Vaping Use   Vaping Use: Never used  Substance and Sexual Activity   Alcohol use: Yes    Alcohol/week: 0.0 standard drinks    Comment: 3-4 times a week 1-2 beers   Drug use: No   Sexual activity: Never    Birth control/protection: Post-menopausal  Other Topics Concern   Not on file  Social History Narrative   Lives alone. She likes to spend time with her sister.    Social Determinants of Health   Financial Resource Strain: Low Risk    Difficulty of Paying Living Expenses: Not hard at all  Food Insecurity: No Food Insecurity   Worried  About Running Out of Food in the Last Year: Never true   Ran Out of Food in the Last Year: Never true  Transportation Needs: No Transportation Needs   Lack of Transportation (Medical): No   Lack of Transportation (Non-Medical): No  Physical Activity: Inactive   Days of Exercise per Week: 0 days   Minutes of Exercise per Session: 0 min  Stress: No Stress Concern Present   Feeling of Stress : Not at all  Social Connections: Socially Isolated   Frequency of Communication with Friends and Family: More than three times a week   Frequency of Social Gatherings with Friends and Family: Three times a week   Attends Religious Services: Never   Active Member of Clubs or Organizations: No   Attends Archivist Meetings: Never   Marital Status: Divorced  Human resources officer Violence: Not At Risk   Fear of  Current or Ex-Partner: No   Emotionally Abused: No   Physically Abused: No   Sexually Abused: No     Allergies  Allergen Reactions   Atorvastatin Other (See Comments)    Muscle cramps   Glipizide Er Other (See Comments)    Shaking / tingling in fingers      Outpatient Medications Prior to Visit  Medication Sig Dispense Refill   AMBULATORY NON FORMULARY MEDICATION Single glucometer with lancets, test strips. Test daily.  Disp qs 3 months E11.9 1 each 0   B Complex-C (B-COMPLEX WITH VITAMIN C) tablet Take 1 tablet by mouth daily.     B-D UF III MINI PEN NEEDLES 31G X 5 MM MISC USE AS DIRECTED 100 each 12   betamethasone dipropionate 0.05 % cream Apply topically 2 (two) times daily. To affected area(s) as needed 45 g 1   Cholecalciferol (VITAMIN D) 2000 UNITS tablet Take 2,000 Units by mouth daily.     cloNIDine (CATAPRES) 0.1 MG tablet Take 1 tablet (0.1 mg total) by mouth daily. 90 tablet 1   cyclobenzaprine (FLEXERIL) 10 MG tablet Take 0.5-1 tablets (5-10 mg total) by mouth 3 (three) times daily as needed for muscle spasms. (Patient not taking: Reported on 01/26/2021) 45 tablet 0   FLUoxetine (PROZAC) 40 MG capsule TAKE 1 CAPSULE BY MOUTH EVERY DAY 90 capsule 3   HYDROcodone-acetaminophen (NORCO/VICODIN) 5-325 MG tablet Take 1-2 tablets by mouth every 6 (six) hours as needed for severe pain. (Patient not taking: Reported on 01/26/2021) 30 tablet 0   Insulin Glargine (BASAGLAR KWIKPEN) 100 UNIT/ML 10 UNITS AT BED INCREASE BY 2 UNITS EVERY 3 DAYS TIL FASTING SUGAR GOAL 120-150 MAX 60 UNITS PER DAY (Patient taking differently: Inject 35 Units into the skin at bedtime.) 3 mL 11   OneTouch Delica Lancets 29H MISC TEST BLOOD SUGARS DAILY 100 each 0   ONETOUCH VERIO test strip TEST BLOOD SUGARS DAILY 100 strip 1   pravastatin (PRAVACHOL) 80 MG tablet TAKE 1 TABLET BY MOUTH EVERY DAY 90 tablet 3   fluticasone furoate-vilanterol (BREO ELLIPTA) 100-25 MCG/INH AEPB Inhale 1 puff into the lungs  daily. (Patient not taking: Reported on 01/24/2021) 1 each 11   No facility-administered medications prior to visit.    Review of Systems  Constitutional:  Negative for chills, fever, malaise/fatigue and weight loss.  HENT:  Negative for congestion, sinus pain and sore throat.   Eyes: Negative.   Respiratory:  Negative for cough, hemoptysis, sputum production, shortness of breath and wheezing.   Cardiovascular:  Negative for chest pain, palpitations, orthopnea, claudication and leg swelling.  Gastrointestinal:  Negative for abdominal pain, heartburn, nausea and vomiting.  Genitourinary: Negative.   Musculoskeletal:  Negative for joint pain and myalgias.  Skin:  Negative for rash.  Neurological:  Negative for weakness.  Endo/Heme/Allergies: Negative.   Psychiatric/Behavioral: Negative.       Objective:   Vitals:   01/24/21 1433  BP: 100/60  Pulse: 81  Temp: 98.6 F (37 C)  TempSrc: Oral  SpO2: 97%  Weight: 179 lb 9.6 oz (81.5 kg)  Height: 5\' 8"  (1.727 m)     Physical Exam Constitutional:      General: She is not in acute distress.    Appearance: She is not ill-appearing.  HENT:     Head: Normocephalic and atraumatic.  Eyes:     General: No scleral icterus.    Conjunctiva/sclera: Conjunctivae normal.     Pupils: Pupils are equal, round, and reactive to light.  Cardiovascular:     Rate and Rhythm: Normal rate and regular rhythm.     Pulses: Normal pulses.     Heart sounds: Normal heart sounds. No murmur heard. Pulmonary:     Effort: Pulmonary effort is normal.     Breath sounds: Normal breath sounds. No wheezing, rhonchi or rales.  Abdominal:     General: Bowel sounds are normal.     Palpations: Abdomen is soft.  Musculoskeletal:     Right lower leg: No edema.     Left lower leg: No edema.  Skin:    General: Skin is warm and dry.  Neurological:     General: No focal deficit present.     Mental Status: She is alert.    CBC    Component Value Date/Time    WBC 5.1 11/08/2020 0000   RBC 4.39 11/08/2020 0000   HGB 13.1 11/08/2020 0000   HGB 12.3 08/28/2014 0942   HCT 40.6 11/08/2020 0000   HCT 36.5 08/28/2014 0942   PLT 298 11/08/2020 0000   PLT 269 08/28/2014 0942   MCV 92.5 11/08/2020 0000   MCV 92.9 08/28/2014 0942   MCH 29.8 11/08/2020 0000   MCHC 32.3 11/08/2020 0000   RDW 13.4 11/08/2020 0000   RDW 12.8 08/28/2014 0942   LYMPHSABS 1,795 11/08/2020 0000   LYMPHSABS 2.7 08/28/2014 0942   MONOABS 525 07/25/2016 1204   MONOABS 0.4 08/28/2014 0942   EOSABS 61 11/08/2020 0000   EOSABS 0.3 08/28/2014 0942   BASOSABS 20 11/08/2020 0000   BASOSABS 0.1 08/28/2014 0942   BMP Latest Ref Rng & Units 11/08/2020 09/20/2020 09/19/2020  Glucose 65 - 99 mg/dL 262(H) 115(H) 126(H)  BUN 7 - 25 mg/dL 15 10 8   Creatinine 0.60 - 0.93 mg/dL 0.79 0.64 0.53  BUN/Creat Ratio 6 - 22 (calc) NOT APPLICABLE - -  Sodium 268 - 146 mmol/L 142 139 138  Potassium 3.5 - 5.3 mmol/L 4.0 3.5 3.6  Chloride 98 - 110 mmol/L 106 102 104  CO2 20 - 32 mmol/L 26 26 26   Calcium 8.6 - 10.4 mg/dL 9.3 8.6(L) 8.2(L)     Chest imaging: CT Chest 01/18/21 Subpleural nodule RIGHT lower lobe 5 mm image 82 unchanged. Small focus of ground-glass opacity in the anterior LEFT upper lobe 10 mm diameter image 47 unchanged. Subpleural nodule anterior LEFT upper lobe 6 mm diameter image 71 unchanged with adjacent ground-glass opacity that appears stable.  CXR 10/20/20 Improved airspace opacities of the right lung. There also appears to be resolution of the right pleural effusion.   CT Chest 09/20/20 1.  Multilobar bilateral pneumonia, most severe in the right upper and lower lobes with small right partially loculated parapneumonic pleural effusion and probable reactive right hilar lymphadenopathy. Follow-up noncontrast chest CT is recommended in 1-2 months after trial of antimicrobial therapy to ensure complete resolution of these findings, as the possibility of underlying  neoplasm (particularly in the right lower lobe) is difficult to exclude.  PFT: PFT Results Latest Ref Rng & Units 09/22/2014  FVC-Pre L 2.92  FVC-Predicted Pre % 77  FVC-Post L 3.09  FVC-Predicted Post % 82  Pre FEV1/FVC % % 70  Post FEV1/FCV % % 72  FEV1-Pre L 2.04  FEV1-Predicted Pre % 71  FEV1-Post L 2.22  DLCO uncorrected ml/min/mmHg 15.21  DLCO UNC% % 49  DLVA Predicted % 59  TLC L 5.38  TLC % Predicted % 92  RV % Predicted % 99    Echo 09/18/20:  1. Left ventricular ejection fraction, by estimation, is 60 to 65%. The  left ventricle has normal function. The left ventricle has no regional  wall motion abnormalities. There is mild left ventricular hypertrophy.  Left ventricular diastolic parameters  are indeterminate.   2. Right ventricular systolic function is normal. The right ventricular  size is normal.   3. The mitral valve is normal in structure. Trivial mitral valve  regurgitation.   4. The aortic valve is normal in structure. Aortic valve regurgitation is  not visualized.   5. The inferior vena cava is dilated in size with >50% respiratory  variability, suggesting right atrial pressure of 8 mmHg.   Assessment & Plan:   Pulmonary nodules  Discussion: Kelli [name] is a 75 year old woman, former smoker with history of RLL lung adenocarcinoma s/p RLL basilar lobe segmentectomy and adenocarcinoma of the cecum who returns to pulmonary clinic for follow up of pneumonia.   Her respiratory status has returned to baseline. She is not requiring further treatment with inhalers at this time.   She does have multiple subcentimeter pulmonary nodules that do require follow up give her history of lung cancer and smoking.   She has follow up with her oncologist Dr. Benay Spice in November with surveillance scans. I will contact them about pushing out the follow up to February 2022 as this will be 6 months after her most recent CT chest scan. After this surveillance scan we can  enter her into our lung cancer screening program if ok with oncology.   Freda Jackson, MD [facility] Office: [phone]    Current Outpatient Medications:    AMBULATORY NON FORMULARY MEDICATION, Single glucometer with lancets, test strips. Test daily.  Disp qs 3 months E11.9, Disp: 1 each, Rfl: 0   B Complex-C (B-COMPLEX WITH VITAMIN C) tablet, Take 1 tablet by mouth daily., Disp: , Rfl:    B-D UF III MINI PEN NEEDLES 31G X 5 MM MISC, USE AS DIRECTED, Disp: 100 each, Rfl: 12   betamethasone dipropionate 0.05 % cream, Apply topically 2 (two) times daily. To affected area(s) as needed, Disp: 45 g, Rfl: 1   Cholecalciferol (VITAMIN D) 2000 UNITS tablet, Take 2,000 Units by mouth daily., Disp: , Rfl:    cloNIDine (CATAPRES) 0.1 MG tablet, Take 1 tablet (0.1 mg total) by mouth daily., Disp: 90 tablet, Rfl: 1   cyclobenzaprine (FLEXERIL) 10 MG tablet, Take 0.5-1 tablets (5-10 mg total) by mouth 3 (three) times daily as needed for muscle spasms. (Patient not taking: Reported on 01/26/2021), Disp: 45 tablet, Rfl: 0  FLUoxetine (PROZAC) 40 MG capsule, TAKE 1 CAPSULE BY MOUTH EVERY DAY, Disp: 90 capsule, Rfl: 3   HYDROcodone-acetaminophen (NORCO/VICODIN) 5-325 MG tablet, Take 1-2 tablets by mouth every 6 (six) hours as needed for severe pain. (Patient not taking: Reported on 01/26/2021), Disp: 30 tablet, Rfl: 0   Insulin Glargine (BASAGLAR KWIKPEN) 100 UNIT/ML, 10 UNITS AT BED INCREASE BY 2 UNITS EVERY 3 DAYS TIL FASTING SUGAR GOAL 120-150 MAX 60 UNITS PER DAY (Patient taking differently: Inject 35 Units into the skin at bedtime.), Disp: 3 mL, Rfl: 11   OneTouch Delica Lancets 94W MISC, TEST BLOOD SUGARS DAILY, Disp: 100 each, Rfl: 0   ONETOUCH VERIO test strip, TEST BLOOD SUGARS DAILY, Disp: 100 strip, Rfl: 1   pravastatin (PRAVACHOL) 80 MG tablet, TAKE 1 TABLET BY MOUTH EVERY DAY, Disp: 90 tablet, Rfl: 3

## 2021-01-26 ENCOUNTER — Other Ambulatory Visit: Payer: Self-pay

## 2021-01-26 ENCOUNTER — Ambulatory Visit: Payer: Medicare HMO | Admitting: Family Medicine

## 2021-01-26 ENCOUNTER — Ambulatory Visit: Payer: Self-pay

## 2021-01-26 VITALS — BP 130/82 | HR 77 | Ht 68.0 in | Wt 178.8 lb

## 2021-01-26 DIAGNOSIS — M25511 Pain in right shoulder: Secondary | ICD-10-CM | POA: Diagnosis not present

## 2021-01-26 DIAGNOSIS — G8929 Other chronic pain: Secondary | ICD-10-CM | POA: Diagnosis not present

## 2021-01-26 NOTE — Progress Notes (Signed)
I, Kelli [name], LAT, ATC acting as a scribe for Kelli Leader, MD.  Kelli [name] is a 75 y.o. female who presents to [facility] [place] [facility] today for continued R shoulder pain due to subscapularis rotator cuff tear, tendinitis, bursitis, and AC DJD. Pt was last seen by Dr. [name] on 07/29/20 and was referred to PT, of which she's completed 5 visits. Today, pt reports R shoulder started bothering her again a few weeks ago. Pt notes that her shoulder has started "popping."  Dx imaging: 07/26/20 R shoulder MRI  11/21/19 R shoulder XR  Pertinent review of systems: No fevers or chills  Relevant historical information: History of lung cancer stage I 2016.  History of colon cancer stage II 2010.  Controlled diabetes.   Exam:  BP 130/82   Pulse 77   Ht 5\' 8"  (1.727 m)   Wt 178 lb 12.8 oz (81.1 kg)   LMP  (LMP Unknown)   SpO2 97%   BMI 27.19 kg/m  General: Well Developed, well nourished, and in no acute distress.   MSK: Right shoulder normal-appearing Normal motion Intact strength. Positive Hawkins and Neer's test.  Mildly positive empty can test.     Lab and Radiology Results  Procedure: Real-time Ultrasound Guided Injection of right shoulder subacromial bursa Device: Philips Affiniti 50G Images permanently stored and available for review in PACS Verbal informed consent obtained.  Discussed risks and benefits of procedure. Warned about infection bleeding damage to structures skin hypopigmentation and fat atrophy among others. Patient expresses understanding and agreement Time-out conducted.   Noted no overlying erythema, induration, or other signs of local infection.   Skin prepped in a sterile fashion.   Local anesthesia: Topical Ethyl chloride.   With sterile technique and under real time ultrasound guidance: 40 mg of Kenalog and 2 ml of Marcaine injected into subacromial bursa. Fluid seen entering the bursa.   Completed without difficulty   Pain  immediately resolved suggesting accurate placement of the medication.   Advised to call if fevers/chills, erythema, induration, drainage, or persistent bleeding.   Images permanently stored and available for review in the ultrasound unit.  Impression: Technically successful ultrasound guided injection.   EXAM: MRI OF THE RIGHT SHOULDER WITHOUT CONTRAST   TECHNIQUE: Multiplanar, multisequence MR imaging of the shoulder was performed. No intravenous contrast was administered.   COMPARISON:  None.   FINDINGS: Rotator cuff: There is a complete focal full-thickness tear of the superior subscapularis tendon measuring approximately 1.5 cm in length. The inferior portion of the subscapularis tendon appears to be intact. there is increased globular signal thickening seen throughout the remainder of the supraspinatus and infraspinatus tendon. The muscles of the rotator cuff are normal without tear, edema, or atrophy.   Muscles: The muscles other than the rotator cuff are normal without tear, edema, or atrophy.   Biceps Long Head: The Intraarticular and extraarticular portions of the biceps tendon are normal in position, size and signal.   Acromioclavicular Joint: Moderate to advanced AC joint arthrosis seen with joint space loss capsular hypertrophy and adjacent reactive marrow. Type II acromion.   Glenohumeral Joint: The glenohumeral joint alignment is well maintained. The there is mild chondral thinning seen at the superior glenohumeral articulation. A small glenohumeral joint effusion is seen.   Labrum: There is attenuation of the superior labrum with blunting of the posterosuperior labrum. No displaced labral tear is seen.   Bones: No fracture, osteonecrosis, or pathologic marrow infiltration.   Other: A  small amount of subacromial-subdeltoid bursal fluid is seen.   IMPRESSION: Focal full-thickness tear of the superior subscapularis tendon measuring 1.5 cm in length.    Mild to moderate subscapularis, supraspinatus, and infraspinatus tendinosis   Mild glenohumeral joint osteoarthritis with superior labral degeneration   Moderate to advanced AC joint arthrosis     Electronically Signed   By: Prudencio Pair M.D.   On: 07/27/2020 08:51 I, Kelli [name], personally (independently) visualized and performed the interpretation of the images attached in this note.       Assessment and Plan: 75 y.o. female with right shoulder pain.  Doing surprisingly well over the last 6 months.  She thrived with physical therapy.  Her pain is slowly returning now a little bit along with some popping sensation.  She has well-preserved strength and range of motion on exam today.  Plan for repeat steroid injection and continued home exercise program.  Certainly happy to reauthorize physical therapy if needed.  She would like to avoid surgery which is reasonable at this point.  Recheck back with me as needed.   PDMP not reviewed this encounter. Orders Placed This Encounter  Procedures   [place] LIMITED JOINT SPACE STRUCTURES UP RIGHT(NO LINKED CHARGES)    Standing Status:   Future    Number of Occurrences:   1    Standing Expiration Date:   07/29/2021    Order Specific Question:   Reason for Exam (SYMPTOM  OR DIAGNOSIS REQUIRED)    Answer:   right shoulder pain    Order Specific Question:   Preferred imaging location?    Answer:   [place]   No orders of the defined types were placed in this encounter.    Discussed warning signs or symptoms. Please see discharge instructions. Patient expresses understanding.   The above documentation has been reviewed and is accurate and complete Kelli [name], M.D.

## 2021-01-26 NOTE — Patient Instructions (Signed)
Thank you for coming in today.   Call or go to the ER if you develop a large red swollen joint with extreme pain or oozing puss.    Continue the home exercises.   I can refer back to PT if needed.   If not better at all we can get you to a surgeon.

## 2021-01-29 ENCOUNTER — Encounter: Payer: Self-pay | Admitting: Pulmonary Disease

## 2021-02-01 ENCOUNTER — Other Ambulatory Visit: Payer: Self-pay | Admitting: *Deleted

## 2021-02-01 DIAGNOSIS — C3431 Malignant neoplasm of lower lobe, right bronchus or lung: Secondary | ICD-10-CM

## 2021-02-01 NOTE — Progress Notes (Signed)
Per Dr. Benay Spice, changed CT chest order to Feb 2023 and scheduling message to move OV to after this scan from November f/u.

## 2021-02-23 ENCOUNTER — Ambulatory Visit: Payer: Medicare HMO | Admitting: Osteopathic Medicine

## 2021-02-23 ENCOUNTER — Ambulatory Visit (INDEPENDENT_AMBULATORY_CARE_PROVIDER_SITE_OTHER): Payer: Medicare HMO | Admitting: Osteopathic Medicine

## 2021-02-23 NOTE — Progress Notes (Signed)
Appt in error, disregard

## 2021-02-24 ENCOUNTER — Telehealth: Payer: Self-pay

## 2021-02-24 DIAGNOSIS — M810 Age-related osteoporosis without current pathological fracture: Secondary | ICD-10-CM

## 2021-02-24 MED ORDER — DENOSUMAB 60 MG/ML ~~LOC~~ SOSY: 60.0000 mg | PREFILLED_SYRINGE | Freq: Once | SUBCUTANEOUS | Status: DC

## 2021-02-24 NOTE — Telephone Encounter (Signed)
Medication: denosumab (PROLIA) injection 60 mg Prior authorization not required Orders for CMP with GFR entered Order for Prolia injection entered. Pt aware of approval and that she will need to come in and have labs done prior to having the injection. Advised her to come in one day to have the labs drawn and once they are back that we will contact her to schedule a NV for the injection. Pt stated she would come in one day next week for labs. No further questions or concerns at this time.

## 2021-02-25 ENCOUNTER — Ambulatory Visit: Payer: Medicare HMO | Admitting: Osteopathic Medicine

## 2021-03-19 ENCOUNTER — Other Ambulatory Visit: Payer: Self-pay | Admitting: Osteopathic Medicine

## 2021-03-19 DIAGNOSIS — M19041 Primary osteoarthritis, right hand: Secondary | ICD-10-CM

## 2021-03-21 DIAGNOSIS — M810 Age-related osteoporosis without current pathological fracture: Secondary | ICD-10-CM | POA: Diagnosis not present

## 2021-03-21 NOTE — Telephone Encounter (Signed)
Routing to covering provider. [facility] requesting med refill for gabapentin. Rx not listed in active med list.

## 2021-03-22 LAB — COMPLETE METABOLIC PANEL WITH GFR
AG Ratio: 1.9 (calc) (ref 1.0–2.5)
ALT: 17 U/L (ref 6–29)
AST: 16 U/L (ref 10–35)
Albumin: 4.3 g/dL (ref 3.6–5.1)
Alkaline phosphatase (APISO): 76 U/L (ref 37–153)
BUN: 14 mg/dL (ref 7–25)
CO2: 29 mmol/L (ref 20–32)
Calcium: 9.4 mg/dL (ref 8.6–10.4)
Chloride: 102 mmol/L (ref 98–110)
Creat: 0.79 mg/dL (ref 0.60–1.00)
Globulin: 2.3 g/dL (calc) (ref 1.9–3.7)
Glucose, Bld: 245 mg/dL — ABNORMAL HIGH (ref 65–139)
Potassium: 4.1 mmol/L (ref 3.5–5.3)
Sodium: 139 mmol/L (ref 135–146)
Total Bilirubin: 0.5 mg/dL (ref 0.2–1.2)
Total Protein: 6.6 g/dL (ref 6.1–8.1)
eGFR: 79 mL/min/{1.73_m2} (ref >=60)

## 2021-03-31 ENCOUNTER — Ambulatory Visit (INDEPENDENT_AMBULATORY_CARE_PROVIDER_SITE_OTHER): Payer: Medicare HMO | Admitting: Family Medicine

## 2021-03-31 ENCOUNTER — Encounter: Payer: Self-pay | Admitting: Family Medicine

## 2021-03-31 VITALS — BP 138/69 | HR 72 | Ht 68.0 in | Wt 182.0 lb

## 2021-03-31 DIAGNOSIS — Z23 Encounter for immunization: Secondary | ICD-10-CM

## 2021-03-31 DIAGNOSIS — Z794 Long term (current) use of insulin: Secondary | ICD-10-CM | POA: Diagnosis not present

## 2021-03-31 DIAGNOSIS — M810 Age-related osteoporosis without current pathological fracture: Secondary | ICD-10-CM | POA: Diagnosis not present

## 2021-03-31 DIAGNOSIS — E119 Type 2 diabetes mellitus without complications: Secondary | ICD-10-CM | POA: Diagnosis not present

## 2021-03-31 DIAGNOSIS — F418 Other specified anxiety disorders: Secondary | ICD-10-CM

## 2021-03-31 DIAGNOSIS — G4700 Insomnia, unspecified: Secondary | ICD-10-CM | POA: Insufficient documentation

## 2021-03-31 DIAGNOSIS — F5101 Primary insomnia: Secondary | ICD-10-CM

## 2021-03-31 DIAGNOSIS — R69 Illness, unspecified: Secondary | ICD-10-CM | POA: Diagnosis not present

## 2021-03-31 LAB — POCT GLYCOSYLATED HEMOGLOBIN (HGB A1C): HbA1c, POC (controlled diabetic range): 7.6 % — AB (ref 0.0–7.0)

## 2021-03-31 MED ORDER — DENOSUMAB 60 MG/ML ~~LOC~~ SOSY
60.0000 mg | PREFILLED_SYRINGE | Freq: Once | SUBCUTANEOUS | Status: AC
  Administered 2021-03-31: 60 mg via SUBCUTANEOUS

## 2021-03-31 MED ORDER — BASAGLAR KWIKPEN 100 UNIT/ML ~~LOC~~ SOPN: PEN_INJECTOR | SUBCUTANEOUS | 11 refills | Status: DC

## 2021-03-31 NOTE — Progress Notes (Signed)
Kelli [name] - 75 y.o. female MRN [id number]  Date of birth: 08-Apr-1946  Subjective Chief Complaint  Patient presents with   Diabetes    HPI Kelli [name] is a wonderfully pleasant 75 year old female here today for follow-up visit.  She is transferring care from Dr. Sheppard Coil.  She has history of type 2 diabetes, lung cancer, depression with anxiety, hyperlipidemia and osteoporosis.  She is doing well with insulin for management of her diabetes.  She has tried to work on some dietary changes as well.  She denies any symptoms of hypoglycemia.  She has taking clonidine and gabapentin which has been helpful for sleep.  She is not taking clonidine for blood pressure.  Prolia was discussed previously with Dr. Sheppard Coil.  She did have labs recently however has not started injections yet.  She would like to have this done today if possible.  ROS:  A comprehensive ROS was completed and negative except as noted per HPI  Allergies  Allergen Reactions   Atorvastatin Other (See Comments)    Muscle cramps   Glipizide Er Other (See Comments)    Shaking / tingling in fingers     Past Medical History:  Diagnosis Date   Anxiety    Arthritis    Borderline diabetic    Colon cancer ([place])    colon ca dx 02/2009, lung cancer   Depression    Depression with anxiety 09/19/2011   H/O colon cancer, stage II 09/19/2011   T3N0  Cecum Sept 2010   High cholesterol    Hyperlipidemia 09/19/2011   Lung nodules 09/19/2011   granulomas   Urinary urgency     Past Surgical History:  Procedure Laterality Date   APPENDECTOMY     AUGMENTATION MAMMAPLASTY     BREAST SURGERY  1978   augmentation   COLON SURGERY     2010   CRYO INTERCOSTAL NERVE BLOCK N/A 10/05/2014   Procedure: CRYO INTERCOSTAL NERVE BLOCK;  Surgeon: [name], MD;  Location: [place];  Service: Thoracic;  Laterality: N/A;   LYMPH NODE DISSECTION Right 10/05/2014   Procedure: LYMPH NODE DISSECTION;  Surgeon: [name], MD;   Location: [facility] OR;  Service: Thoracic;  Laterality: Right;   ORIF HUMERUS FRACTURE Left 10/01/2018   Procedure: OPEN REDUCTION INTERNAL FIXATION (ORIF) LEFT PROXIMAL HUMERUS FRACTURE;  Surgeon: [name], MD;  Location: [place];  Service: Orthopedics;  Laterality: Left;   PARTIAL COLECTOMY Right 2010   SEGMENTECOMY Right 10/05/2014   Procedure: right lower lobe SEGMENTECTOMY;  Surgeon: [name], MD;  Location: [facility] OR;  Service: Thoracic;  Laterality: Right;   VIDEO ASSISTED THORACOSCOPY (VATS)/WEDGE RESECTION Right 10/05/2014   Procedure: Right VIDEO ASSISTED THORACOSCOPY (VATS) with right lower lobe lung nodule WEDGE RESECTION;  Surgeon: [name], MD;  Location: [facility] OR;  Service: Thoracic;  Laterality: Right;    Social History   Socioeconomic History   Marital status: Divorced    Spouse name: Not on file   Number of children: 0   Years of education: 16   Highest education level: Some college, no degree  Occupational History    Comment: Retired.  Tobacco Use   Smoking status: Former    Packs/day: 3.00    Years: 40.00    Pack years: 120.00    Types: Cigarettes    Start date: 1962    Quit date: 02/06/2009    Years since quitting: 12.1   Smokeless tobacco: Never  Vaping Use   Vaping Use:  Never used  Substance and Sexual Activity   Alcohol use: Yes    Alcohol/week: 0.0 standard drinks    Comment: 3-4 times a week 1-2 beers   Drug use: No   Sexual activity: Never    Birth control/protection: Post-menopausal  Other Topics Concern   Not on file  Social History Narrative   Lives alone. She likes to spend time with her sister.    Social Determinants of Health   Financial Resource Strain: Low Risk    Difficulty of Paying Living Expenses: Not hard at all  Food Insecurity: No Food Insecurity   Worried About Charity fundraiser in the Last Year: Never true   [place] in the Last Year: Never true  Transportation Needs: No Transportation Needs   Lack  of Transportation (Medical): No   Lack of Transportation (Non-Medical): No  Physical Activity: Inactive   Days of Exercise per Week: 0 days   Minutes of Exercise per Session: 0 min  Stress: No Stress Concern Present   Feeling of Stress : Not at all  Social Connections: Socially Isolated   Frequency of Communication with Friends and Family: More than three times a week   Frequency of Social Gatherings with Friends and Family: Three times a week   Attends Religious Services: Never   Active Member of Clubs or Organizations: No   Attends Music therapist: Never   Marital Status: Divorced    Family History  Problem Relation Age of Onset   Heart disease Mother    Colon polyps Sister    Heart disease Brother    Colon cancer Neg Hx    Esophageal cancer Neg Hx    Stomach cancer Neg Hx    Rectal cancer Neg Hx     Health Maintenance  Topic Date Due   FOOT EXAM  Never done   Zoster Vaccines- Shingrix (1 of 2) Never done   Pneumonia Vaccine 64+ Years old (2 - PPSV23 if available, else PCV20) 08/22/2017   OPHTHALMOLOGY EXAM  02/02/2021   COVID-19 Vaccine (5 - Booster for [place] series) 03/17/2021   HEMOGLOBIN A1C  09/29/2021   MAMMOGRAM  12/30/2022   COLONOSCOPY (Pts 45-3yrs Insurance coverage will need to be confirmed)  01/29/2025   TETANUS/TDAP  10/01/2028   INFLUENZA VACCINE  Completed   DEXA SCAN  Completed   Hepatitis C Screening  Completed   HPV VACCINES  Aged Out     ----------------------------------------------------------------------------------------------------------------------------------------------------------------------------------------------------------------- Physical Exam BP 138/69 (BP Location: Left Arm, Patient Position: Sitting, Cuff Size: Normal)   Pulse 72   Ht 5\' 8"  (1.727 m)   Wt 182 lb (82.6 kg)   LMP  (LMP Unknown)   SpO2 97%   BMI 27.67 kg/m   Physical Exam Constitutional:      Appearance: Normal appearance.  Eyes:      General: No scleral icterus. Musculoskeletal:     Cervical back: Neck supple.  Neurological:     General: No focal deficit present.     Mental Status: She is alert.  Psychiatric:        Mood and Affect: Mood normal.        Behavior: Behavior normal.    ------------------------------------------------------------------------------------------------------------------------------------------------------------------------------------------------------------------- Assessment and Plan  Diabetes mellitus type 2, uncomplicated ([place]) Most recent A1c of  Lab Results  Component Value Date   HGBA1C 7.6 (A) 03/31/2021  Blood sugars improved today.  she will continue current insulin dosing.  Counseled on healthy, low carb diet and recommend  frequent activity to help with maintaining good control of blood sugars.    Osteoporosis Injection of Prolia given today.  Repeat in 6 months.  Depression with anxiety Stable with fluoxetine, continue.  Insomnia This is improved with combination of gabapentin and clonidine.  No symptoms of hypotension with clonidine.   Meds ordered this encounter  Medications   Insulin Glargine (BASAGLAR KWIKPEN) 100 UNIT/ML    Sig: 35 UNITS AT BED INCREASE BY 2 UNITS EVERY 3 DAYS TIL FASTING SUGAR GOAL 120-150 MAX 60 UNITS PER DAY    Dispense:  3 mL    Refill:  11   denosumab (PROLIA) injection 60 mg    Order Specific Question:   Patient is enrolled in [facility] program for this medication and I have provided a copy of the Prolia Medication Guide and Patient Brochure.    Answer:   Yes    Order Specific Question:   I have reviewed with the patient the information in the Prolia Medication Guide and Patient Counseling Chart including the serious risks of Prolia and symptoms of each risk.    Answer:   Yes    Order Specific Question:   I have advised the patient to seek medical attention if they have signs or symptoms of any of the serious risks.    Answer:   Yes     Return in about 3 months (around 07/01/2021) for DM.    This visit occurred during the SARS-CoV-2 public health emergency.  Safety protocols were in place, including screening questions prior to the visit, additional usage of staff PPE, and extensive cleaning of exam room while observing appropriate contact time as indicated for disinfecting solutions.

## 2021-03-31 NOTE — Assessment & Plan Note (Addendum)
Injection of Prolia given today.  Repeat in 6 months.

## 2021-03-31 NOTE — Assessment & Plan Note (Signed)
Stable with fluoxetine, continue.

## 2021-03-31 NOTE — Patient Instructions (Addendum)
Great to see you today! Continue current medications.  You will receive prolia injections every 6 months.  See me again in about 3-4 months.

## 2021-03-31 NOTE — Assessment & Plan Note (Signed)
Most recent A1c of  Lab Results  Component Value Date   HGBA1C 7.6 (A) 03/31/2021  Blood sugars improved today.  she will continue current insulin dosing.  Counseled on healthy, low carb diet and recommend frequent activity to help with maintaining good control of blood sugars.

## 2021-03-31 NOTE — Assessment & Plan Note (Signed)
This is improved with combination of gabapentin and clonidine.  No symptoms of hypotension with clonidine.

## 2021-04-13 ENCOUNTER — Ambulatory Visit: Payer: Medicare HMO | Admitting: Nurse Practitioner

## 2021-04-19 ENCOUNTER — Other Ambulatory Visit: Payer: Self-pay | Admitting: Osteopathic Medicine

## 2021-05-23 ENCOUNTER — Other Ambulatory Visit: Payer: Self-pay | Admitting: Osteopathic Medicine

## 2021-06-07 ENCOUNTER — Telehealth: Payer: Self-pay | Admitting: Family Medicine

## 2021-06-07 DIAGNOSIS — L84 Corns and callosities: Secondary | ICD-10-CM

## 2021-06-07 NOTE — Telephone Encounter (Signed)
Referral entered  

## 2021-06-07 NOTE — Telephone Encounter (Signed)
Dr Zigmund Daniel   I spoke with Manuela Schwartz and she states she has a corn on her little toe and is unable to put anything on it due to her diabetes. She is requesting a referral to [facility] downstairs to treat her for this.   Jenny Reichmann

## 2021-06-10 ENCOUNTER — Encounter: Payer: Self-pay | Admitting: Podiatry

## 2021-06-10 ENCOUNTER — Other Ambulatory Visit: Payer: Self-pay

## 2021-06-10 ENCOUNTER — Ambulatory Visit (INDEPENDENT_AMBULATORY_CARE_PROVIDER_SITE_OTHER): Payer: No Typology Code available for payment source | Admitting: Podiatry

## 2021-06-10 DIAGNOSIS — L84 Corns and callosities: Secondary | ICD-10-CM

## 2021-06-10 DIAGNOSIS — E119 Type 2 diabetes mellitus without complications: Secondary | ICD-10-CM | POA: Diagnosis not present

## 2021-06-10 DIAGNOSIS — M205X1 Other deformities of toe(s) (acquired), right foot: Secondary | ICD-10-CM | POA: Diagnosis not present

## 2021-06-10 NOTE — Progress Notes (Signed)
°  Subjective:  Patient ID: Kelli [name], female    DOB: March 31, 1946,   MRN: [id number]  Chief Complaint  Patient presents with   Callouses    painful corn right foot- off and on for years now- very painful- unable to use corn pad due to it being in between  toes- further evalaution     76 y.o. female presents for concern of a painful corn on the right foot that has been present for many years.  Usually has it debrided at a nail salon and has avoided corn pads due to her diabetes. Patient relates she is diabetic and her last A1c was  8.0 . Denies any other pedal complaints. Denies n/v/f/c.   PCP: Luetta Nutting DO Last seen 03/31/21  Past Medical History:  Diagnosis Date   Anxiety    Arthritis    Borderline diabetic    Colon cancer ([place])    colon ca dx 02/2009, lung cancer   Depression    Depression with anxiety 09/19/2011   H/O colon cancer, stage II 09/19/2011   T3N0  Cecum Sept 2010   High cholesterol    Hyperlipidemia 09/19/2011   Lung nodules 09/19/2011   granulomas   Urinary urgency     Objective:  Physical Exam: Vascular: DP/PT pulses 2/4 bilateral. CFT <3 seconds. Normal hair growth on digits. No edema.  Skin. No lacerations or abrasions bilateral feet. Hyperkeratotic lesion noted to medial fifth digit on the right.  Musculoskeletal: MMT 5/5 bilateral lower extremities in DF, PF, Inversion and Eversion. Deceased ROM in DF of ankle joint. Adductovarus noted to right fifth digit.  Neurological: Sensation intact to light touch.   Assessment:   1. Corns and callosities   2. Type 2 diabetes mellitus without complication, without long-term current use of insulin ([place])   3. Adductovarus rotation of toe, acquired, right      Plan:  Patient was evaluated and treated and all questions answered. -Discussed corns and calluses with patient and treatment options.  -Hyperkeratotic tissue was debrided with chisel without incident.  -Encouraged daily moisturizing -Discussed use of  pumice stone -Toe cap provided.  -Advised good supportive shoes and inserts -Discussed and educated patient on diabetic foot care, especially with  regards to the vascular, neurological and musculoskeletal systems.  -Stressed the importance of good glycemic control and the detriment of not  controlling glucose levels in relation to the foot. -Discussed supportive shoes at all times and checking feet regularly.  -Answered all patient questions -Patient to return as needed or in 3 months for at risk foot care.     Lorenda Peck, DPM

## 2021-06-16 ENCOUNTER — Other Ambulatory Visit: Payer: Self-pay | Admitting: Osteopathic Medicine

## 2021-06-16 ENCOUNTER — Other Ambulatory Visit: Payer: Self-pay | Admitting: Family Medicine

## 2021-06-16 DIAGNOSIS — M19041 Primary osteoarthritis, right hand: Secondary | ICD-10-CM

## 2021-07-05 ENCOUNTER — Other Ambulatory Visit: Payer: Self-pay | Admitting: Family Medicine

## 2021-07-05 ENCOUNTER — Encounter: Payer: Self-pay | Admitting: Family Medicine

## 2021-07-05 ENCOUNTER — Other Ambulatory Visit: Payer: Self-pay

## 2021-07-05 ENCOUNTER — Ambulatory Visit (INDEPENDENT_AMBULATORY_CARE_PROVIDER_SITE_OTHER): Payer: No Typology Code available for payment source | Admitting: Family Medicine

## 2021-07-05 VITALS — BP 118/74 | HR 79 | Ht 68.0 in | Wt 181.0 lb

## 2021-07-05 DIAGNOSIS — Z794 Long term (current) use of insulin: Secondary | ICD-10-CM

## 2021-07-05 DIAGNOSIS — F418 Other specified anxiety disorders: Secondary | ICD-10-CM | POA: Diagnosis not present

## 2021-07-05 DIAGNOSIS — Z23 Encounter for immunization: Secondary | ICD-10-CM

## 2021-07-05 DIAGNOSIS — E785 Hyperlipidemia, unspecified: Secondary | ICD-10-CM

## 2021-07-05 DIAGNOSIS — E119 Type 2 diabetes mellitus without complications: Secondary | ICD-10-CM

## 2021-07-05 DIAGNOSIS — L989 Disorder of the skin and subcutaneous tissue, unspecified: Secondary | ICD-10-CM | POA: Diagnosis not present

## 2021-07-05 DIAGNOSIS — L820 Inflamed seborrheic keratosis: Secondary | ICD-10-CM | POA: Diagnosis not present

## 2021-07-05 LAB — POCT GLYCOSYLATED HEMOGLOBIN (HGB A1C): HbA1c, POC (controlled diabetic range): 8.5 % — AB (ref 0.0–7.0)

## 2021-07-05 MED ORDER — SHINGRIX 50 MCG/0.5ML IM SUSR: 0.5000 mL | Freq: Once | INTRAMUSCULAR | 0 refills | Status: AC

## 2021-07-05 NOTE — Assessment & Plan Note (Signed)
Stable with fluoxetine at current strength.

## 2021-07-05 NOTE — Progress Notes (Signed)
Kelli [name] - 76 y.o. female MRN [id number]  Date of birth: [birth year]  Subjective Chief Complaint  Patient presents with   Diabetes    HPI Kelli [name] is a 76 year old female here today for follow-up visit.  Reports overall she is doing well.  She has a skin lesion she would like evaluated on her back today.  Blood sugars are elevated with A1c of 8.5% today.  She reports that she has not been as active over the past several months.  She does have a new treadmill that she plans to start using.  She would like to work on these changes prior to increasing her insulin.  She denies any significant symptoms related to her diabetes at this time.  Blood pressure has remained well controlled with clonidine.  No side effects noted with this.  Depression anxiety remains well controlled with fluoxetine  ROS:  A comprehensive ROS was completed and negative except as noted per HPI  No Active Allergies  Past Medical History:  Diagnosis Date   Anxiety    Arthritis    Borderline diabetic    Colon cancer ([place])    colon ca dx 02/2009, lung cancer   Depression    Depression with anxiety 09/19/2011   H/O colon cancer, stage II 09/19/2011   T3N0  Cecum Sept 2010   High cholesterol    Hyperlipidemia 09/19/2011   Lung nodules 09/19/2011   granulomas   Urinary urgency     Past Surgical History:  Procedure Laterality Date   APPENDECTOMY     AUGMENTATION MAMMAPLASTY     BREAST SURGERY  1978   augmentation   COLON SURGERY     2010   CRYO INTERCOSTAL NERVE BLOCK N/A 10/05/2014   Procedure: CRYO INTERCOSTAL NERVE BLOCK;  Surgeon: [name], MD;  Location: [place];  Service: Thoracic;  Laterality: N/A;   LYMPH NODE DISSECTION Right 10/05/2014   Procedure: LYMPH NODE DISSECTION;  Surgeon: [name], MD;  Location: [place];  Service: Thoracic;  Laterality: Right;   ORIF HUMERUS FRACTURE Left 10/01/2018   Procedure: OPEN REDUCTION INTERNAL FIXATION (ORIF) LEFT PROXIMAL HUMERUS FRACTURE;   Surgeon: [name], MD;  Location: [place];  Service: Orthopedics;  Laterality: Left;   PARTIAL COLECTOMY Right 2010   SEGMENTECOMY Right 10/05/2014   Procedure: right lower lobe SEGMENTECTOMY;  Surgeon: [name], MD;  Location: [facility] OR;  Service: Thoracic;  Laterality: Right;   VIDEO ASSISTED THORACOSCOPY (VATS)/WEDGE RESECTION Right 10/05/2014   Procedure: Right VIDEO ASSISTED THORACOSCOPY (VATS) with right lower lobe lung nodule WEDGE RESECTION;  Surgeon: [name], MD;  Location: [facility] OR;  Service: Thoracic;  Laterality: Right;    Social History   Socioeconomic History   Marital status: Divorced    Spouse name: Not on file   Number of children: 0   Years of education: 16   Highest education level: Some college, no degree  Occupational History    Comment: Retired.  Tobacco Use   Smoking status: Former    Packs/day: 3.00    Years: 40.00    Pack years: 120.00    Types: Cigarettes    Start date: 1962    Quit date: 02/06/2009    Years since quitting: 12.4   Smokeless tobacco: Never  Vaping Use   Vaping Use: Never used  Substance and Sexual Activity   Alcohol use: Yes    Alcohol/week: 0.0 standard drinks    Comment: 3-4 times a week 1-2 beers  Drug use: No   Sexual activity: Never    Birth control/protection: Post-menopausal  Other Topics Concern   Not on file  Social History Narrative   Lives alone. She likes to spend time with her sister.    Social Determinants of Health   Financial Resource Strain: Low Risk    Difficulty of Paying Living Expenses: Not hard at all  Food Insecurity: No Food Insecurity   Worried About Charity fundraiser in the Last Year: Never true   [place] in the Last Year: Never true  Transportation Needs: No Transportation Needs   Lack of Transportation (Medical): No   Lack of Transportation (Non-Medical): No  Physical Activity: Inactive   Days of Exercise per Week: 0 days   Minutes of Exercise per Session: 0 min   Stress: No Stress Concern Present   Feeling of Stress : Not at all  Social Connections: Socially Isolated   Frequency of Communication with Friends and Family: More than three times a week   Frequency of Social Gatherings with Friends and Family: Three times a week   Attends Religious Services: Never   Active Member of Clubs or Organizations: No   Attends Archivist Meetings: Never   Marital Status: Divorced    Family History  Problem Relation Age of Onset   Heart disease Mother    Colon polyps Sister    Heart disease Brother    Colon cancer Neg Hx    Esophageal cancer Neg Hx    Stomach cancer Neg Hx    Rectal cancer Neg Hx     Health Maintenance  Topic Date Due   Zoster Vaccines- Shingrix (1 of 2) Never done   OPHTHALMOLOGY EXAM  02/02/2021   COVID-19 Vaccine (5 - Booster for Pfizer series) 03/17/2021   HEMOGLOBIN A1C  01/02/2022   FOOT EXAM  06/10/2022   MAMMOGRAM  12/30/2022   COLONOSCOPY (Pts 45-27yrs Insurance coverage will need to be confirmed)  01/29/2025   TETANUS/TDAP  10/01/2028   Pneumonia Vaccine 51+ Years old  Completed   INFLUENZA VACCINE  Completed   DEXA SCAN  Completed   Hepatitis C Screening  Completed   HPV VACCINES  Aged Out     ----------------------------------------------------------------------------------------------------------------------------------------------------------------------------------------------------------------- Physical Exam BP 118/74 (BP Location: Right Arm, Patient Position: Sitting, Cuff Size: Normal)    Pulse 79    Ht 5\' 8"  (1.727 m)    Wt 181 lb (82.1 kg)    LMP  (LMP Unknown)    SpO2 98%    BMI 27.52 kg/m   Physical Exam Constitutional:      Appearance: Normal appearance.  Cardiovascular:     Rate and Rhythm: Normal rate and regular rhythm.  Pulmonary:     Effort: Pulmonary effort is normal.     Breath sounds: Normal breath sounds.  Musculoskeletal:     Cervical back: Neck supple.  Skin:    Comments:  Several skin lesions consistent with seborrheic keratoses along the back.  She does have 1 area with appearance that is different than other lesions.  Appears to be inflamed seborrheic keratosis.  Neurological:     Mental Status: She is alert.   Procedure note: Procedure discussed with patient including alternatives to procedure.  Verbal consent given and allergies reviewed.  Patient prepped in typical sterile fashion with chlorhexidine.  Area around skin lesion was injected with 2.5 mL of 1% lidocaine.  Shave biopsy of lesion performed.  Small capillary bleeding controlled with pressure and  silver nitrate.  Cover with antibiotic ointment and Band-Aid.  She tolerated this well.    ------------------------------------------------------------------------------------------------------------------------------------------------------------------------------------------------------------------- Assessment and Plan  Diabetes mellitus type 2, uncomplicated ([place]) Blood sugars are not well controlled with elevated A1c today.  Recommend working on diet and exercise change.  If blood sugars are not improving with this we will need to continue titration of her insulin.  Depression with anxiety Stable with fluoxetine at current strength.  Hyperlipidemia Tolerating pravastatin well at current strength.  Skin lesion Shave biopsy of skin lesion completed today.  See procedure note.   Meds ordered this encounter  Medications   Zoster Vaccine Adjuvanted [facility]) injection    Sig: Inject 0.5 mLs into the muscle once for 1 dose.    Dispense:  0.5 mL    Refill:  0    Return in about 3 months (around 10/02/2021) for DM.    This visit occurred during the SARS-CoV-2 public health emergency.  Safety protocols were in place, including screening questions prior to the visit, additional usage of staff PPE, and extensive cleaning of exam room while observing appropriate contact time as indicated for  disinfecting solutions.

## 2021-07-05 NOTE — Assessment & Plan Note (Signed)
Tolerating pravastatin well at current strength.

## 2021-07-05 NOTE — Assessment & Plan Note (Signed)
Shave biopsy of skin lesion completed today.  See procedure note.

## 2021-07-05 NOTE — Assessment & Plan Note (Signed)
Blood sugars are not well controlled with elevated A1c today.  Recommend working on diet and exercise change.  If blood sugars are not improving with this we will need to continue titration of her insulin.

## 2021-07-08 ENCOUNTER — Encounter: Payer: Self-pay | Admitting: Family Medicine

## 2021-07-21 ENCOUNTER — Ambulatory Visit (HOSPITAL_COMMUNITY)
Admission: RE | Admit: 2021-07-21 | Discharge: 2021-07-21 | Disposition: A | Discharge status: Home / Self Care | Payer: No Typology Code available for payment source | Source: Ambulatory Visit | Attending: Oncology | Admitting: Oncology

## 2021-07-21 ENCOUNTER — Other Ambulatory Visit: Payer: Self-pay

## 2021-07-21 DIAGNOSIS — J9811 Atelectasis: Secondary | ICD-10-CM | POA: Diagnosis not present

## 2021-07-21 DIAGNOSIS — E785 Hyperlipidemia, unspecified: Secondary | ICD-10-CM | POA: Diagnosis not present

## 2021-07-21 DIAGNOSIS — E119 Type 2 diabetes mellitus without complications: Secondary | ICD-10-CM | POA: Diagnosis not present

## 2021-07-21 DIAGNOSIS — Z794 Long term (current) use of insulin: Secondary | ICD-10-CM | POA: Diagnosis not present

## 2021-07-21 DIAGNOSIS — C3431 Malignant neoplasm of lower lobe, right bronchus or lung: Secondary | ICD-10-CM | POA: Insufficient documentation

## 2021-07-21 DIAGNOSIS — I700 Atherosclerosis of aorta: Secondary | ICD-10-CM | POA: Diagnosis not present

## 2021-07-21 DIAGNOSIS — J439 Emphysema, unspecified: Secondary | ICD-10-CM | POA: Diagnosis not present

## 2021-07-21 DIAGNOSIS — R911 Solitary pulmonary nodule: Secondary | ICD-10-CM | POA: Diagnosis not present

## 2021-07-22 ENCOUNTER — Other Ambulatory Visit (HOSPITAL_COMMUNITY): Payer: Self-pay

## 2021-07-22 LAB — CBC WITH DIFFERENTIAL/PLATELET
Absolute Monocytes: 395 cells/uL (ref 200–950)
Basophils Absolute: 61 cells/uL (ref 0–200)
Basophils Relative: 0.8 %
Eosinophils Absolute: 312 cells/uL (ref 15–500)
Eosinophils Relative: 4.1 %
HCT: 39.3 % (ref 35.0–45.0)
Hemoglobin: 12.9 g/dL (ref 11.7–15.5)
Lymphs Abs: 2212 cells/uL (ref 850–3900)
MCH: 30.2 pg (ref 27.0–33.0)
MCHC: 32.8 g/dL (ref 32.0–36.0)
MCV: 92 fL (ref 80.0–100.0)
MPV: 11.5 fL (ref 7.5–12.5)
Monocytes Relative: 5.2 %
Neutro Abs: 4621 cells/uL (ref 1500–7800)
Neutrophils Relative %: 60.8 %
Platelets: 298 10*3/uL (ref 140–400)
RBC: 4.27 10*6/uL (ref 3.80–5.10)
RDW: 12.5 % (ref 11.0–15.0)
Total Lymphocyte: 29.1 %
WBC: 7.6 10*3/uL (ref 3.8–10.8)

## 2021-07-22 LAB — COMPLETE METABOLIC PANEL WITH GFR
AG Ratio: 1.6 (calc) (ref 1.0–2.5)
ALT: 18 U/L (ref 6–29)
AST: 17 U/L (ref 10–35)
Albumin: 4.1 g/dL (ref 3.6–5.1)
Alkaline phosphatase (APISO): 85 U/L (ref 37–153)
BUN: 16 mg/dL (ref 7–25)
CO2: 28 mmol/L (ref 20–32)
Calcium: 9 mg/dL (ref 8.6–10.4)
Chloride: 105 mmol/L (ref 98–110)
Creat: 0.7 mg/dL (ref 0.60–1.00)
Globulin: 2.6 g/dL (calc) (ref 1.9–3.7)
Glucose, Bld: 139 mg/dL — ABNORMAL HIGH (ref 65–99)
Potassium: 4.3 mmol/L (ref 3.5–5.3)
Sodium: 140 mmol/L (ref 135–146)
Total Bilirubin: 0.4 mg/dL (ref 0.2–1.2)
Total Protein: 6.7 g/dL (ref 6.1–8.1)
eGFR: 91 mL/min/{1.73_m2} (ref >=60)

## 2021-07-22 LAB — LIPID PANEL W/REFLEX DIRECT LDL
Cholesterol: 218 mg/dL — ABNORMAL HIGH (ref <200)
HDL: 47 mg/dL — ABNORMAL LOW (ref >=50)
LDL Cholesterol (Calc): 137 mg/dL (calc) — ABNORMAL HIGH
Non-HDL Cholesterol (Calc): 171 mg/dL (calc) — ABNORMAL HIGH (ref <130)
Total CHOL/HDL Ratio: 4.6 (calc) (ref <5.0)
Triglycerides: 200 mg/dL — ABNORMAL HIGH (ref <150)

## 2021-07-25 ENCOUNTER — Other Ambulatory Visit: Payer: Self-pay

## 2021-07-25 ENCOUNTER — Inpatient Hospital Stay: Payer: No Typology Code available for payment source | Attending: Nurse Practitioner | Admitting: Nurse Practitioner

## 2021-07-25 ENCOUNTER — Ambulatory Visit (INDEPENDENT_AMBULATORY_CARE_PROVIDER_SITE_OTHER): Payer: No Typology Code available for payment source | Admitting: Family Medicine

## 2021-07-25 ENCOUNTER — Encounter: Payer: Self-pay | Admitting: Nurse Practitioner

## 2021-07-25 ENCOUNTER — Ambulatory Visit: Payer: Self-pay

## 2021-07-25 VITALS — BP 125/62 | HR 90 | Temp 98.1°F | Resp 18 | Ht 68.0 in | Wt 178.6 lb

## 2021-07-25 VITALS — BP 138/86 | HR 71 | Ht 68.0 in | Wt 176.8 lb

## 2021-07-25 DIAGNOSIS — C3431 Malignant neoplasm of lower lobe, right bronchus or lung: Secondary | ICD-10-CM | POA: Diagnosis not present

## 2021-07-25 DIAGNOSIS — Z9221 Personal history of antineoplastic chemotherapy: Secondary | ICD-10-CM | POA: Diagnosis not present

## 2021-07-25 DIAGNOSIS — G8929 Other chronic pain: Secondary | ICD-10-CM

## 2021-07-25 DIAGNOSIS — Z85038 Personal history of other malignant neoplasm of large intestine: Secondary | ICD-10-CM | POA: Diagnosis not present

## 2021-07-25 DIAGNOSIS — M25511 Pain in right shoulder: Secondary | ICD-10-CM

## 2021-07-25 DIAGNOSIS — Z87891 Personal history of nicotine dependence: Secondary | ICD-10-CM | POA: Insufficient documentation

## 2021-07-25 DIAGNOSIS — Z85110 Personal history of malignant carcinoid tumor of bronchus and lung: Secondary | ICD-10-CM | POA: Diagnosis not present

## 2021-07-25 NOTE — Progress Notes (Signed)
[place] OFFICE PROGRESS NOTE   Diagnosis: Colon cancer, non-small cell lung cancer  INTERVAL HISTORY:   Kelli [name] returns for follow-up.  She feels well.  She reports having pneumonia in April of last year.  No shortness of breath or cough at present.  No change in bowel habits.  No blood or pain with bowel movements.  Objective:  Vital signs in last 24 hours:  Blood pressure 125/62, pulse 90, temperature 98.1 F (36.7 C), temperature source Oral, resp. rate 18, height _0  (1.727 m), weight 178 lb 9.6 oz (81 kg), SpO2 100 %.    Lymphatics: No palpable cervical, supraclavicular, axillary or inguinal lymph nodes. Resp: Lungs clear bilaterally. Cardio: Regular rate and rhythm.  Distant heart sounds. GI: Abdomen soft and nontender.  No hepatosplenomegaly. Vascular: No leg edema.   Lab Results:  Lab Results  Component Value Date   WBC 7.6 07/21/2021   HGB 12.9 07/21/2021   HCT 39.3 07/21/2021   MCV 92.0 07/21/2021   PLT 298 07/21/2021   NEUTROABS 4,621 07/21/2021    Imaging:  No results found.  Medications: I have reviewed the patient's current medications.  Assessment/Plan: 1. Stage II (T3 N0) adenocarcinoma the cecum diagnosed in September 2010, status post adjuvant Xeloda MSI-high, BRAF mutation detected 2. History of colonic polyps-status post a surveillance colonoscopy in February 2016 with a tubular adenoma removed Colonoscopy 01/30/2020-polyps removed from the rectum and descending colon, hyperplastic polyps 3. History of heavy tobacco use, quit in 2010 4. History of lung nodules-last imaging was an abdomen CT in October 2013 prior to repeat imaging 09/03/2014 CT 09/03/2014 revealed enlargement of a spiculated right lower lobe nodule PET scan 09/16/2014 revealed low-level FDG activity associated with the right lower lobe spiculated nodule Status post a right lower lobe basilar segmentectomy and mediastinal lymph node dissection on 10/06/2014  with the pathology confirming a minimally invasive well-differentiated adenocarcinoma,pT1a,N0, invasive component measured less than 0.3 cm, with negative surgical margins CT chest 09/15/2015 with no evidence of recurrent lung cancer, stable nodules except for a probable new 4 mm right lower lobe subpleural nodule CT chest 06/19/2016-no evidence of recurrent lung cancer, stable lung nodules CT chest 03/20/2017-stable lung nodules CT chest 03/26/2018- stable lung nodules CT chest 04/09/2019-stable lung nodules CT chest 04/10/2019-stable lung nodules CT chest 10/25/2020-stable lung nodules.  Resolution of multifocal pulmonary infiltrate and improvement in bronchial wall thickening. CT chest 01/18/2021-stable small solid nodules and groundglass opacities. CT chest 07/23/2021-subpleural focal area of groundglass attenuation within the anterior left upper lobe mildly increased when compared to remote priors, stable when compared to more recent priors.  Additional multiple bilateral pulmonary nodules grossly stable when compared to chest CT exams dating back to 2018.   5.  Post thoracotomy pain-resolved   6.  Left humerus fracture following a fall, status post surgical repair 10/01/2018    Disposition: Ms. Caccavale remains in clinical remission from colon cancer and lung cancer.  The recent chest CT was stable except for an area in the left upper lobe which was mildly increased when compared to remote priors.  We will obtain a follow-up noncontrast CT scan of the chest in 6 months, office visit a few days later.  She will contact the office in the interim with any problems.   Ned Card ANP/GNP-BC   07/25/2021  2:28 PM

## 2021-07-25 NOTE — Patient Instructions (Addendum)
Thank you for coming in today.   You received a steroid injection in your right shoulder today. Seek immediate medical attention if the joint becomes red, extremely painful, or is oozing fluid.   Work on your home exercises  Recheck back as needed

## 2021-07-25 NOTE — Progress Notes (Signed)
I, [name], LAT, ATC acting as a scribe for [name], MD.  Kelli [name] is a 76 y.o. female who presents to [place] at [facility] today for continued chronic R shoulder pain. Pt was last seen by Dr. [name] on 01/26/21 and was given a R subacromial steroid injection and advised to cont HEP. Today, pt reports R shoulder really started hurting again a few weeks ago. Pt notes much relief from prior steroid injection. Pt admits to not being as dedicated to her HEP as she should be. Pt reports she has tried to hold off for as long as she could before wanting to repeat the steroid injection.  Dx imaging: 07/26/20 R shoulder MRI             11/21/19 R shoulder XR  Pertinent review of systems: No fevers or chills  Relevant historical information: Lung cancer history.  Diabetes.   Exam:  BP 138/86    Pulse 71    Ht 5\' 8"  (1.727 m)    Wt 176 lb 12.8 oz (80.2 kg)    LMP  (LMP Unknown)    SpO2 98%    BMI 26.88 kg/m  General: Well Developed, well nourished, and in no acute distress.   MSK: Right shoulder normal. Nontender. Range of motion very limited abduction and functional internal rotation.  Normal external rotation. Strength 4/5 to abduction and external rotation. Positive Hawkins and Neer's test.  Negative Yergason's and speeds test.    Lab and Radiology Results  Procedure: Real-time Ultrasound Guided Injection of right shoulder subacromial bursa Device: Philips Affiniti 50G Images permanently stored and available for review in PACS Verbal informed consent obtained.  Discussed risks and benefits of procedure. Warned about infection bleeding damage to structures skin hypopigmentation and fat atrophy among others. Patient expresses understanding and agreement Time-out conducted.   Noted no overlying erythema, induration, or other signs of local infection.   Skin prepped in a sterile fashion.   Local anesthesia: Topical Ethyl chloride.   With sterile  technique and under real time ultrasound guidance: 40 mg of Kenalog and 2 mL of Marcaine injected into subacromial bursa. Fluid seen entering the bursa.   Completed without difficulty   Pain immediately resolved suggesting accurate placement of the medication.   Advised to call if fevers/chills, erythema, induration, drainage, or persistent bleeding.   Images permanently stored and available for review in the ultrasound unit.  Impression: Technically successful ultrasound guided injection.    EXAM: MRI OF THE RIGHT SHOULDER WITHOUT CONTRAST   TECHNIQUE: Multiplanar, multisequence MR imaging of the shoulder was performed. No intravenous contrast was administered.   COMPARISON:  None.   FINDINGS: Rotator cuff: There is a complete focal full-thickness tear of the superior subscapularis tendon measuring approximately 1.5 cm in length. The inferior portion of the subscapularis tendon appears to be intact. there is increased globular signal thickening seen throughout the remainder of the supraspinatus and infraspinatus tendon. The muscles of the rotator cuff are normal without tear, edema, or atrophy.   Muscles: The muscles other than the rotator cuff are normal without tear, edema, or atrophy.   Biceps Long Head: The Intraarticular and extraarticular portions of the biceps tendon are normal in position, size and signal.   Acromioclavicular Joint: Moderate to advanced AC joint arthrosis seen with joint space loss capsular hypertrophy and adjacent reactive marrow. Type II acromion.   Glenohumeral Joint: The glenohumeral joint alignment is well maintained. The there is mild chondral thinning  seen at the superior glenohumeral articulation. A small glenohumeral joint effusion is seen.   Labrum: There is attenuation of the superior labrum with blunting of the posterosuperior labrum. No displaced labral tear is seen.   Bones: No fracture, osteonecrosis, or pathologic  marrow infiltration.   Other: A small amount of subacromial-subdeltoid bursal fluid is seen.   IMPRESSION: Focal full-thickness tear of the superior subscapularis tendon measuring 1.5 cm in length.   Mild to moderate subscapularis, supraspinatus, and infraspinatus tendinosis   Mild glenohumeral joint osteoarthritis with superior labral degeneration   Moderate to advanced AC joint arthrosis     Electronically Signed   By: Prudencio Pair M.D.   On: 07/27/2020 08:51 I, [name], personally (independently) visualized and performed the interpretation of the images attached in this note.     Assessment and Plan: 76 y.o. female with right shoulder pain due to subacromial bursitis.  Improved with injection.  Continue home exercise program and recheck back as needed.   PDMP not reviewed this encounter. Orders Placed This Encounter  Procedures   [place] LIMITED JOINT SPACE STRUCTURES UP RIGHT(NO LINKED CHARGES)    Standing Status:   Future    Number of Occurrences:   1    Standing Expiration Date:   01/22/2022    Order Specific Question:   Reason for Exam (SYMPTOM  OR DIAGNOSIS REQUIRED)    Answer:   right shoulder pain    Order Specific Question:   Preferred imaging location?    Answer:   [place]   No orders of the defined types were placed in this encounter.    Discussed warning signs or symptoms. Please see discharge instructions. Patient expresses understanding.   The above documentation has been reviewed and is accurate and complete [name], M.D.

## 2021-07-28 ENCOUNTER — Other Ambulatory Visit: Payer: Self-pay

## 2021-09-12 ENCOUNTER — Other Ambulatory Visit: Payer: Self-pay | Admitting: Family Medicine

## 2021-09-14 ENCOUNTER — Other Ambulatory Visit: Payer: Self-pay | Admitting: Family Medicine

## 2021-09-14 ENCOUNTER — Other Ambulatory Visit: Payer: Self-pay | Admitting: Osteopathic Medicine

## 2021-09-14 DIAGNOSIS — M19041 Primary osteoarthritis, right hand: Secondary | ICD-10-CM

## 2021-09-15 ENCOUNTER — Telehealth: Payer: Self-pay

## 2021-09-15 DIAGNOSIS — M810 Age-related osteoporosis without current pathological fracture: Secondary | ICD-10-CM

## 2021-09-15 NOTE — Telephone Encounter (Signed)
Copy of front and back of insurance cards as well as [employer] Assist form faxed to [employer] to start the process of benefits summary. Fax confirmation obtained.  ?

## 2021-09-20 NOTE — Telephone Encounter (Signed)
Call to [employer] to make this a STAT request.  ?

## 2021-09-22 NOTE — Telephone Encounter (Signed)
For primary MD purchase option, Prolia will be sibject to a 20% coinsurance up to a $3900 OOP max ($160.57 met). Once met, coverage is 100%. Administration is subject to a $15 copay, which does contribute to the OOP max. No deductible applies. ? ?PA is required. Form printed from [employer] and completed. Faxed with records and confirmation receipt obtained.  ?

## 2021-09-23 ENCOUNTER — Telehealth: Payer: Self-pay

## 2021-09-23 ENCOUNTER — Other Ambulatory Visit: Payer: Self-pay

## 2021-09-23 MED ORDER — CALCIUM 1000 + D 1000-20 MG-MCG PO TABS: 1.0000 | ORAL_TABLET | Freq: Every day | ORAL | 2 refills | Status: AC

## 2021-09-23 MED ORDER — CALCIUM 1000 + D 1000-20 MG-MCG PO TABS: 2.0000 | ORAL_TABLET | Freq: Every day | ORAL | 2 refills | Status: DC

## 2021-09-23 NOTE — Progress Notes (Signed)
calcium ?

## 2021-09-23 NOTE — Telephone Encounter (Signed)
LVM with [facility].  ? ?Changed calcium Rx to 1000mg  (1 tab) instead of 2 tabs.  ?

## 2021-09-23 NOTE — Telephone Encounter (Signed)
Call to patient regarding whether she was taking a calcium supplement since she was on prolia and they are asking for a PA. She stated that no one ever told her to take any calcium. How much should she be taking?  ? ?Also, patient saw a podiatrist downstairs but cannot seems to get back in touch with them and her foot is worse than before. She is willing to see anyone.  ?

## 2021-09-23 NOTE — Telephone Encounter (Signed)
PA was obtained for 2 visits.  Auth # [id number].  ?

## 2021-09-29 ENCOUNTER — Ambulatory Visit (INDEPENDENT_AMBULATORY_CARE_PROVIDER_SITE_OTHER): Payer: No Typology Code available for payment source | Admitting: Podiatry

## 2021-09-29 ENCOUNTER — Encounter: Payer: Self-pay | Admitting: Podiatry

## 2021-09-29 DIAGNOSIS — E119 Type 2 diabetes mellitus without complications: Secondary | ICD-10-CM | POA: Diagnosis not present

## 2021-09-29 DIAGNOSIS — L84 Corns and callosities: Secondary | ICD-10-CM

## 2021-09-29 NOTE — Progress Notes (Signed)
?  Subjective:  ?Patient ID: Kelli [name], female    DOB: 08/18/1945,   MRN: [id number] ? ?No chief complaint on file. ? ? ?76 y.o. female presents for concern of a painful corn on the right foot that has been present for many years.  Usually has it debrided at a nail salon and has avoided corn pads due to her diabetes. Patient relates she is diabetic and her last A1c was  8.0 . Denies any other pedal complaints. Denies n/v/f/c.  ? ?PCP: Luetta Nutting DO Last seen 03/31/21 ? ?Past Medical History:  ?Diagnosis Date  ? Anxiety   ? Arthritis   ? Borderline diabetic   ? Colon cancer ([place])   ? colon ca dx 02/2009, lung cancer  ? Depression   ? Depression with anxiety 09/19/2011  ? H/O colon cancer, stage II 09/19/2011  ? T3N0  Cecum Sept 2010  ? High cholesterol   ? Hyperlipidemia 09/19/2011  ? Lung nodules 09/19/2011  ? granulomas  ? Urinary urgency   ? ? ?Objective:  ?Physical Exam: ?Vascular: DP/PT pulses 2/4 bilateral. CFT <3 seconds. Normal hair growth on digits. No edema.  ?Skin. No lacerations or abrasions bilateral feet. Hyperkeratotic lesion noted to medial fifth digit on the right.  ?Musculoskeletal: MMT 5/5 bilateral lower extremities in DF, PF, Inversion and Eversion. Deceased ROM in DF of ankle joint. Adductovarus noted to right fifth digit.  ?Neurological: Sensation intact to light touch.  ? ?Assessment:  ? ?1. Corns and callosities   ?2. Type 2 diabetes mellitus without complication, without long-term current use of insulin ([place])   ? ? ? ? ?Plan:  ?Patient was evaluated and treated and all questions answered. ?-Discussed corns and calluses with patient and treatment options.  ?-Hyperkeratotic tissue was debrided with chisel without incident.  ?-Encouraged daily moisturizing ?-Discussed use of pumice stone ?-Toe cap provided.  ?-Advised good supportive shoes and inserts ?-Discussed and educated patient on diabetic foot care, especially with  ?regards to the vascular, neurological and musculoskeletal systems.   ?-Stressed the importance of good glycemic control and the detriment of not  ?controlling glucose levels in relation to the foot. ?-Discussed supportive shoes at all times and checking feet regularly.  ?-Answered all patient questions ?-Patient to return as needed or in 3 months for at risk foot care.  ? ? ? ?Lorenda Peck, DPM  ? ? ?

## 2021-10-03 ENCOUNTER — Ambulatory Visit (INDEPENDENT_AMBULATORY_CARE_PROVIDER_SITE_OTHER): Payer: No Typology Code available for payment source | Admitting: Family Medicine

## 2021-10-03 ENCOUNTER — Encounter: Payer: Self-pay | Admitting: Family Medicine

## 2021-10-03 VITALS — BP 108/67 | HR 73 | Ht 68.0 in | Wt 177.0 lb

## 2021-10-03 DIAGNOSIS — Z794 Long term (current) use of insulin: Secondary | ICD-10-CM

## 2021-10-03 DIAGNOSIS — F5101 Primary insomnia: Secondary | ICD-10-CM

## 2021-10-03 DIAGNOSIS — E785 Hyperlipidemia, unspecified: Secondary | ICD-10-CM

## 2021-10-03 DIAGNOSIS — F418 Other specified anxiety disorders: Secondary | ICD-10-CM

## 2021-10-03 DIAGNOSIS — E119 Type 2 diabetes mellitus without complications: Secondary | ICD-10-CM | POA: Diagnosis not present

## 2021-10-03 LAB — POCT GLYCOSYLATED HEMOGLOBIN (HGB A1C): HbA1c, POC (controlled diabetic range): 7 % (ref 0.0–7.0)

## 2021-10-03 NOTE — Assessment & Plan Note (Signed)
Continues on combination of gabapentin and clonidine.  This continues to work quite well for her.  No hypotensive symptoms with clonidine. ?

## 2021-10-03 NOTE — Assessment & Plan Note (Signed)
Lab Results  ?Component Value Date  ? LDLCALC 137 (H) 07/21/2021  ?Tolerating pravastatin well at this time. ?

## 2021-10-03 NOTE — Assessment & Plan Note (Signed)
Blood sugars are much better controlled at this time.  Recommend continuation of Basaglar at current strength in addition to current dietary changes. ?

## 2021-10-03 NOTE — Assessment & Plan Note (Signed)
Remained stable with fluoxetine at current strength. ?

## 2021-10-03 NOTE — Progress Notes (Signed)
?Kelli [name] - 76 y.o. female MRN [id number]  Date of birth: 15-Nov-1945 ? ?Subjective ?Chief Complaint  ?Patient presents with  ? Diabetes  ? ? ?HPI ?Kelli [name] is a delightful 76 year old female here today for follow-up visit. ? ?History of type 2 diabetes that is currently managed with Basaglar.  Using as directed and has been working on some dietary changes and increased activity as well.  A1c has improved nicely to 7% today.  She has not had any hypoglycemic episodes.  Tolerating pravastatin well for associated hyperlipidemia. ? ?She seen podiatry for painful corn on the right foot. ? ?She is due for updated prolia. ? ?She does continue on clonidine and gabapentin to help with chronic insomnia.  These continue to work pretty well for her. ? ?ROS:  A comprehensive ROS was completed and negative except as noted per HPI ? ? ?No Active Allergies ? ?Past Medical History:  ?Diagnosis Date  ? Anxiety   ? Arthritis   ? Borderline diabetic   ? Colon cancer ([place])   ? colon ca dx 02/2009, lung cancer  ? Depression   ? Depression with anxiety 09/19/2011  ? H/O colon cancer, stage II 09/19/2011  ? T3N0  Cecum Sept 2010  ? High cholesterol   ? Hyperlipidemia 09/19/2011  ? Lung nodules 09/19/2011  ? granulomas  ? Urinary urgency   ? ? ?Past Surgical History:  ?Procedure Laterality Date  ? APPENDECTOMY    ? AUGMENTATION MAMMAPLASTY    ? BREAST SURGERY  1978  ? augmentation  ? COLON SURGERY    ? 2010  ? CRYO INTERCOSTAL NERVE BLOCK N/A 10/05/2014  ? Procedure: CRYO INTERCOSTAL NERVE BLOCK;  Surgeon: [name], MD;  Location: [place];  Service: Thoracic;  Laterality: N/A;  ? LYMPH NODE DISSECTION Right 10/05/2014  ? Procedure: LYMPH NODE DISSECTION;  Surgeon: [name], MD;  Location: [place];  Service: Thoracic;  Laterality: Right;  ? ORIF HUMERUS FRACTURE Left 10/01/2018  ? Procedure: OPEN REDUCTION INTERNAL FIXATION (ORIF) LEFT PROXIMAL HUMERUS FRACTURE;  Surgeon: [name], MD;  Location: [place];  Service:  Orthopedics;  Laterality: Left;  ? PARTIAL COLECTOMY Right 2010  ? SEGMENTECOMY Right 10/05/2014  ? Procedure: right lower lobe SEGMENTECTOMY;  Surgeon: [name], MD;  Location: [place];  Service: Thoracic;  Laterality: Right;  ? VIDEO ASSISTED THORACOSCOPY (VATS)/WEDGE RESECTION Right 10/05/2014  ? Procedure: Right VIDEO ASSISTED THORACOSCOPY (VATS) with right lower lobe lung nodule WEDGE RESECTION;  Surgeon: [name], MD;  Location: [place];  Service: Thoracic;  Laterality: Right;  ? ? ?Social History  ? ?Socioeconomic History  ? Marital status: Divorced  ?  Spouse name: Not on file  ? Number of children: 0  ? Years of education: 108  ? Highest education level: Some college, no degree  ?Occupational History  ?  Comment: Retired.  ?Tobacco Use  ? Smoking status: Former  ?  Packs/day: 3.00  ?  Years: 40.00  ?  Pack years: 120.00  ?  Types: Cigarettes  ?  Start date: 1962  ?  Quit date: 02/06/2009  ?  Years since quitting: 12.6  ? Smokeless tobacco: Never  ?Vaping Use  ? Vaping Use: Never used  ?Substance and Sexual Activity  ? Alcohol use: Yes  ?  Alcohol/week: 0.0 standard drinks  ?  Comment: 3-4 times a week 1-2 beers  ? Drug use: No  ? Sexual activity: Never  ?  Birth control/protection: Post-menopausal  ?  Other Topics Concern  ? Not on file  ?Social History Narrative  ? Lives alone. She likes to spend time with her sister.   ? ?Social Determinants of Health  ? ?Financial Resource Strain: Low Risk   ? Difficulty of Paying Living Expenses: Not hard at all  ?Food Insecurity: No Food Insecurity  ? Worried About Charity fundraiser in the Last Year: Never true  ? Ran Out of Food in the Last Year: Never true  ?Transportation Needs: No Transportation Needs  ? Lack of Transportation (Medical): No  ? Lack of Transportation (Non-Medical): No  ?Physical Activity: Inactive  ? Days of Exercise per Week: 0 days  ? Minutes of Exercise per Session: 0 min  ?Stress: No Stress Concern Present  ? Feeling of Stress : Not  at all  ?Social Connections: Socially Isolated  ? Frequency of Communication with Friends and Family: More than three times a week  ? Frequency of Social Gatherings with Friends and Family: Three times a week  ? Attends Religious Services: Never  ? Active Member of Clubs or Organizations: No  ? Attends Archivist Meetings: Never  ? Marital Status: Divorced  ? ? ?Family History  ?Problem Relation Age of Onset  ? Heart disease Mother   ? Colon polyps Sister   ? Heart disease Brother   ? Colon cancer Neg Hx   ? Esophageal cancer Neg Hx   ? Stomach cancer Neg Hx   ? Rectal cancer Neg Hx   ? ? ?Health Maintenance  ?Topic Date Due  ? Zoster Vaccines- Shingrix (1 of 2) Never done  ? COVID-19 Vaccine (5 - Booster for Pfizer series) 03/17/2021  ? OPHTHALMOLOGY EXAM  11/24/2021  ? INFLUENZA VACCINE  01/03/2022  ? HEMOGLOBIN A1C  04/05/2022  ? FOOT EXAM  06/10/2022  ? MAMMOGRAM  12/30/2022  ? COLONOSCOPY (Pts 45-70yrs Insurance coverage will need to be confirmed)  01/29/2025  ? TETANUS/TDAP  10/01/2028  ? Pneumonia Vaccine 1+ Years old  Completed  ? DEXA SCAN  Completed  ? Hepatitis C Screening  Completed  ? HPV VACCINES  Aged Out  ? ? ? ?----------------------------------------------------------------------------------------------------------------------------------------------------------------------------------------------------------------- ?Physical Exam ?BP 108/67 (BP Location: Right Arm, Patient Position: Sitting, Cuff Size: Normal)   Pulse 73   Ht 5\' 8"  (1.727 m)   Wt 177 lb (80.3 kg)   LMP  (LMP Unknown)   SpO2 98%   BMI 26.91 kg/m?  ? ?Physical Exam ?Constitutional:   ?   Appearance: Normal appearance.  ?HENT:  ?   Head: Normocephalic and atraumatic.  ?Eyes:  ?   General: No scleral icterus. ?Cardiovascular:  ?   Rate and Rhythm: Normal rate and regular rhythm.  ?Pulmonary:  ?   Effort: Pulmonary effort is normal.  ?   Breath sounds: Normal breath sounds.  ?Musculoskeletal:  ?   Cervical back: Neck  supple.  ?Neurological:  ?   Mental Status: She is alert.  ?Psychiatric:     ?   Mood and Affect: Mood normal.     ?   Behavior: Behavior normal.  ? ? ?------------------------------------------------------------------------------------------------------------------------------------------------------------------------------------------------------------------- ?Assessment and Plan ? ?Diabetes mellitus type 2, uncomplicated ([place]) ?Blood sugars are much better controlled at this time.  Recommend continuation of Basaglar at current strength in addition to current dietary changes. ? ?Hyperlipidemia ?Lab Results  ?Component Value Date  ? LDLCALC 137 (H) 07/21/2021  ?Tolerating pravastatin well at this time. ? ?Insomnia ?Continues on combination of gabapentin and clonidine.  This continues  to work quite well for her.  No hypotensive symptoms with clonidine. ? ?Depression with anxiety ?Remained stable with fluoxetine at current strength. ? ? ?No orders of the defined types were placed in this encounter. ? ? ?Return in about 4 months (around 02/03/2022) for T2DM. ? ? ? ?This visit occurred during the SARS-CoV-2 public health emergency.  Safety protocols were in place, including screening questions prior to the visit, additional usage of staff PPE, and extensive cleaning of exam room while observing appropriate contact time as indicated for disinfecting solutions.  ? ?

## 2021-10-03 NOTE — Patient Instructions (Addendum)
Great to see you today! ?Continue current medications.  ?See me again in about 4 months.  ?

## 2021-10-20 NOTE — Addendum Note (Signed)
Addended by: Dema Severin on: 10/20/2021 03:31 PM   Modules accepted: Orders

## 2021-10-20 NOTE — Telephone Encounter (Signed)
Spoke with patient, she states that she does not want to get the injection at the time because her new insurance does not cover it. She will discuss this with you at her next office visit.

## 2021-10-20 NOTE — Telephone Encounter (Signed)
Lab orders placed.   Please call patient to schedule for Prolia injection at her conveenience

## 2021-10-20 NOTE — Telephone Encounter (Signed)
Called patient to let her know the lab orders are in & to have labs done & schedule a prolia injection. Patient was questioning exactly what a prolia injection is. Transferred the phone call to the nurse. AMUCK

## 2021-10-26 ENCOUNTER — Encounter: Payer: Self-pay | Admitting: Family Medicine

## 2021-10-27 ENCOUNTER — Encounter: Payer: No Typology Code available for payment source | Admitting: Sports Medicine

## 2021-10-27 ENCOUNTER — Ambulatory Visit: Payer: Self-pay

## 2021-10-27 ENCOUNTER — Ambulatory Visit (INDEPENDENT_AMBULATORY_CARE_PROVIDER_SITE_OTHER): Payer: No Typology Code available for payment source

## 2021-10-27 ENCOUNTER — Ambulatory Visit (INDEPENDENT_AMBULATORY_CARE_PROVIDER_SITE_OTHER): Payer: No Typology Code available for payment source | Admitting: Family Medicine

## 2021-10-27 ENCOUNTER — Encounter: Payer: Self-pay | Admitting: Family Medicine

## 2021-10-27 VITALS — BP 100/72 | HR 81 | Ht 68.0 in | Wt 176.8 lb

## 2021-10-27 DIAGNOSIS — M25572 Pain in left ankle and joints of left foot: Secondary | ICD-10-CM | POA: Diagnosis not present

## 2021-10-27 DIAGNOSIS — M7989 Other specified soft tissue disorders: Secondary | ICD-10-CM | POA: Diagnosis not present

## 2021-10-27 DIAGNOSIS — S82892A Other fracture of left lower leg, initial encounter for closed fracture: Secondary | ICD-10-CM | POA: Diagnosis not present

## 2021-10-27 DIAGNOSIS — M810 Age-related osteoporosis without current pathological fracture: Secondary | ICD-10-CM

## 2021-10-27 NOTE — Progress Notes (Signed)
I, [name], LAT, ATC, am serving as scribe for Dr. [name].  Kelli [name] is a 76 y.o. female who presents to [place] at [facility] today for L ankle pain. Pt was previously seen by Dr. [name] on 07/25/21 for chronic R shoulder pain. Today, pt reports "spraining" her L ankle on 10/20/21 when she rolled her ankle when her dog pulled on it's leash. Pt locates pain to her L lateral foot and L lateral ankle.  L ankle swelling: yes Bruising: yes along lateral ankle, foot and toes Aggravates: walking/weight-bearing; resting a dependent position; L ankle AROM Treatments tried: ice, ace wrap, Tylenol, Motrin; hydrocodone  Pertinent review of systems: No fevers or chills  Relevant historical information: History of lung cancer.  Osteoporosis.   Exam:  BP 100/72 (BP Location: Left Arm, Patient Position: Sitting, Cuff Size: Normal)   Pulse 81   Ht 5\' 8"  (1.727 m)   Wt 176 lb 12.8 oz (80.2 kg)   LMP  (LMP Unknown)   SpO2 96%   BMI 26.88 kg/m  General: Well Developed, well nourished, and in no acute distress.   MSK: Left foot and ankle: Significant swelling and bruising left foot and ankle especially lateral midfoot.  Skin is erythematous dorsal foot. Decreased ankle and foot range of motion. Stability not tested due to pain.  Capillary fill and sensation are intact distally    Lab and Radiology Results  X-ray images left foot and ankle obtained today personally and independently interpreted. Avulsion fracture present at talus and ATFL insertion site. No other acute fractures noted over my read.   Await formal radiology review.    Assessment and Plan: 76 y.o. female with left ankle avulsion fracture.  This occurred about a week ago.  Plan for compression elevation and physical therapy.  We will use ASO ankle brace switching to cam walker boot if needed.  Recheck in 3 weeks.  She does have some skin erythema.  I think this is due to significant swelling  and not due to cellulitis however if worsening she will let me know and I can start antibiotics.  She can always send a picture through MyChart or return to clinic if needed.   PDMP not reviewed this encounter. Orders Placed This Encounter  Procedures   DG Ankle Complete Left    Standing Status:   Future    Number of Occurrences:   1    Standing Expiration Date:   10/28/2022    Order Specific Question:   Reason for Exam (SYMPTOM  OR DIAGNOSIS REQUIRED)    Answer:   left ankle pain    Order Specific Question:   Preferred imaging location?    Answer:   [facility]   DG Foot Complete Left    Standing Status:   Future    Number of Occurrences:   1    Standing Expiration Date:   11/27/2021    Order Specific Question:   Reason for Exam (SYMPTOM  OR DIAGNOSIS REQUIRED)    Answer:   L foot pain    Order Specific Question:   Preferred imaging location?    Answer:   [facility]   [place] LIMITED JOINT SPACE STRUCTURES LOW LEFT(NO LINKED CHARGES)    Order Specific Question:   Reason for Exam (SYMPTOM  OR DIAGNOSIS REQUIRED)    Answer:   L ankle sprain    Order Specific Question:   Preferred imaging location?    Answer:  [place]   Ambulatory referral to Physical Therapy    Referral Priority:   Routine    Referral Type:   Physical Medicine    Referral Reason:   Specialty Services Required    Requested Specialty:   Physical Therapy    Number of Visits Requested:   1   No orders of the defined types were placed in this encounter.    Discussed warning signs or symptoms. Please see discharge instructions. Patient expresses understanding.   The above documentation has been reviewed and is accurate and complete [name], M.D.

## 2021-10-27 NOTE — Patient Instructions (Addendum)
Good to see you today.  I've referred you to Physical Therapy.  Let us know if you don't hear from them in one week.   ASO ankle brace.   Follow-up: 3 weeks.   Let me know if that rash worsens.

## 2021-11-01 ENCOUNTER — Ambulatory Visit (INDEPENDENT_AMBULATORY_CARE_PROVIDER_SITE_OTHER): Payer: No Typology Code available for payment source | Admitting: Medical-Surgical

## 2021-11-01 DIAGNOSIS — Z0000 Encounter for general adult medical examination without abnormal findings: Secondary | ICD-10-CM | POA: Diagnosis not present

## 2021-11-01 NOTE — Patient Instructions (Addendum)
[facility] Maintenance Summary and Written Plan of Care  Kelli [name] ,  Thank you for allowing me to perform your Medicare Annual Wellness Visit and for your ongoing commitment to your health.   Health Maintenance & Immunization History Health Maintenance  Topic Date Due   COVID-19 Vaccine (5 - Booster for Pfizer series) 11/17/2021 (Originally 03/17/2021)   Zoster Vaccines- Shingrix (1 of 2) 02/01/2022 (Originally 01/21/1965)   OPHTHALMOLOGY EXAM  11/24/2021   INFLUENZA VACCINE  01/03/2022   HEMOGLOBIN A1C  04/05/2022   FOOT EXAM  06/10/2022   MAMMOGRAM  12/30/2022   COLONOSCOPY (Pts 45-4yrs Insurance coverage will need to be confirmed)  01/29/2025   TETANUS/TDAP  10/01/2028   Pneumonia Vaccine 41+ Years old  Completed   DEXA SCAN  Completed   Hepatitis C Screening  Completed   HPV VACCINES  Aged Out   Immunization History  Administered Date(s) Administered   Fluad Quad(high Dose 65+) 03/13/2019, 04/01/2020, 03/31/2021   Influenza,inj,Quad PF,6+ Mos 03/20/2018   PFIZER(Purple Top)SARS-COV-2 Vaccination 08/08/2019, 09/03/2019, 04/12/2020, 01/20/2021   Pneumococcal Conjugate-13 08/22/2016   Pneumococcal Polysaccharide-23 07/05/2021   Pneumococcal-Unspecified 02/03/2009   Tdap 10/02/2018    These are the patient goals that we discussed:  Goals Addressed               This Visit's Progress     Patient Stated (pt-stated)        Would like to loose some weight.          This is a list of Health Maintenance Items that are overdue or due now: Shingrix     Orders/Referrals Placed Today: No orders of the defined types were placed in this encounter.  (Contact our referral department at [phone] if you have not spoken with someone about your referral appointment within the next 5 days)    Follow-up Plan Follow-up with Luetta Nutting, DO as planned Schedule your shingrix at your pharmacy. Medicare wellness visit in one year. Patient  will access AVS on my chart.      Health Maintenance, Female Adopting a healthy lifestyle and getting preventive care are important in promoting health and wellness. Ask your health care provider about: The right schedule for you to have regular tests and exams. Things you can do on your own to prevent diseases and keep yourself healthy. What should I know about diet, weight, and exercise? Eat a healthy diet  Eat a diet that includes plenty of vegetables, fruits, low-fat dairy products, and lean protein. Do not eat a lot of foods that are high in solid fats, added sugars, or sodium. Maintain a healthy weight Body mass index (BMI) is used to identify weight problems. It estimates body fat based on height and weight. Your health care provider can help determine your BMI and help you achieve or maintain a healthy weight. Get regular exercise Get regular exercise. This is one of the most important things you can do for your health. Most adults should: Exercise for at least 150 minutes each week. The exercise should increase your heart rate and make you sweat (moderate-intensity exercise). Do strengthening exercises at least twice a week. This is in addition to the moderate-intensity exercise. Spend less time sitting. Even light physical activity can be beneficial. Watch cholesterol and blood lipids Have your blood tested for lipids and cholesterol at 74 years of age, then have this test every 5 years. Have your cholesterol levels checked more often if: Your lipid or cholesterol levels are  high. You are older than 74 years of age. You are at high risk for heart disease. What should I know about cancer screening? Depending on your health history and family history, you may need to have cancer screening at various ages. This may include screening for: Breast cancer. Cervical cancer. Colorectal cancer. Skin cancer. Lung cancer. What should I know about heart disease, diabetes, and high  blood pressure? Blood pressure and heart disease High blood pressure causes heart disease and increases the risk of stroke. This is more likely to develop in people who have high blood pressure readings or are overweight. Have your blood pressure checked: Every 3-5 years if you are 45-67 years of age. Every year if you are 31 years old or older. Diabetes Have regular diabetes screenings. This checks your fasting blood sugar level. Have the screening done: Once every three years after age 101 if you are at a normal weight and have a low risk for diabetes. More often and at a younger age if you are overweight or have a high risk for diabetes. What should I know about preventing infection? Hepatitis B If you have a higher risk for hepatitis B, you should be screened for this virus. Talk with your health care provider to find out if you are at risk for hepatitis B infection. Hepatitis C Testing is recommended for: Everyone born from [birth year] through 1963. Anyone with known risk factors for hepatitis C. Sexually transmitted infections (STIs) Get screened for STIs, including gonorrhea and chlamydia, if: You are sexually active and are younger than 74 years of age. You are older than 74 years of age and your health care provider tells you that you are at risk for this type of infection. Your sexual activity has changed since you were last screened, and you are at increased risk for chlamydia or gonorrhea. Ask your health care provider if you are at risk. Ask your health care provider about whether you are at high risk for HIV. Your health care provider may recommend a prescription medicine to help prevent HIV infection. If you choose to take medicine to prevent HIV, you should first get tested for HIV. You should then be tested every 3 months for as long as you are taking the medicine. Pregnancy If you are about to stop having your period (premenopausal) and you may become pregnant, seek counseling  before you get pregnant. Take 400 to 800 micrograms (mcg) of folic acid every day if you become pregnant. Ask for birth control (contraception) if you want to prevent pregnancy. Osteoporosis and menopause Osteoporosis is a disease in which the bones lose minerals and strength with aging. This can result in bone fractures. If you are 40 years old or older, or if you are at risk for osteoporosis and fractures, ask your health care provider if you should: Be screened for bone loss. Take a calcium or vitamin D supplement to lower your risk of fractures. Be given hormone replacement therapy (HRT) to treat symptoms of menopause. Follow these instructions at home: Alcohol use Do not drink alcohol if: Your health care provider tells you not to drink. You are pregnant, may be pregnant, or are planning to become pregnant. If you drink alcohol: Limit how much you have to: 0-1 drink a day. Know how much alcohol is in your drink. In the [place] one drink equals one 12 oz bottle of beer (355 mL), one 5 oz glass of wine (148 mL), or one 1 oz glass of hard liquor (  44 mL). Lifestyle Do not use any products that contain nicotine or tobacco. These products include cigarettes, chewing tobacco, and vaping devices, such as e-cigarettes. If you need help quitting, ask your health care provider. Do not use street drugs. Do not share needles. Ask your health care provider for help if you need support or information about quitting drugs. General instructions Schedule regular health, dental, and eye exams. Stay current with your vaccines. Tell your health care provider if: You often feel depressed. You have ever been abused or do not feel safe at home. Summary Adopting a healthy lifestyle and getting preventive care are important in promoting health and wellness. Follow your health care provider's instructions about healthy diet, exercising, and getting tested or screened for diseases. Follow your health care  provider's instructions on monitoring your cholesterol and blood pressure. This information is not intended to replace advice given to you by your health care provider. Make sure you discuss any questions you have with your health care provider. Document Revised: 10/11/2020 Document Reviewed: 10/11/2020 Elsevier Patient Education  [place].

## 2021-11-01 NOTE — Progress Notes (Signed)
Left foot x-ray shows a small avulsion fracture like we talked about.

## 2021-11-01 NOTE — Progress Notes (Signed)
MEDICARE ANNUAL WELLNESS VISIT  11/01/2021  Telephone Visit Disclaimer This Medicare AWV was conducted by telephone due to national recommendations for restrictions regarding the COVID-19 Pandemic (e.g. social distancing).  I verified, using two identifiers, that I am speaking with Kelli [name] or their authorized healthcare agent. I discussed the limitations, risks, security, and privacy concerns of performing an evaluation and management service by telephone and the potential availability of an in-person appointment in the future. The patient expressed understanding and agreed to proceed.  Location of Patient: Home Location of Provider (nurse):  In the office.  Subjective:    Kelli [name] is a 76 y.o. female patient of Kelli Nutting, DO who had a Medicare Annual Wellness Visit today via telephone. Laylia is Retired and lives alone. she has 0 children. she reports that she is socially active and does interact with friends/family regularly. she is minimally physically active and enjoys doing what she wants to do.  Patient Care Team: Kelli Nutting, DO as PCP - General (Family Medicine) Annia Belt, MD as Consulting Physician (Oncology) Milus Banister, MD as Attending Physician (Gastroenterology) [name], MD as Consulting Physician (Oncology)     11/01/2021    1:34 PM 07/25/2021    1:59 PM 10/25/2020    2:11 PM 09/17/2020    3:24 PM 09/16/2020    1:57 PM 08/02/2020    1:51 PM 04/12/2020   11:55 AM  Advanced Directives  Does Patient Have a Medical Advance Directive? Yes Yes Yes  No Yes Yes  Type of Advance Directive Living will Living will;Healthcare Power of [place];Living will   [place];Living will [place];Living will  Does patient want to make changes to medical advance directive? No - Patient declined No - Patient declined No - Patient declined    No - Patient declined  Copy of [place] in Chart?   Yes - validated most recent copy scanned in chart (See row information)   No - copy requested Yes - validated most recent copy scanned in chart (See row information)  Would patient like information on creating a medical advance directive?    No - Patient declined       Hospital Utilization Over the Past 12 Months: # of hospitalizations or ER visits: 0 # of surgeries: 0  Review of Systems    Patient reports that her overall health is unchanged compared to last year.  History obtained from chart review and the patient  Patient Reported Readings (BP, Pulse, CBG, Weight, etc) none  Pain Assessment Pain : 0-10 Pain Score: 5  Pain Type: Acute pain Pain Location: Foot Pain Orientation: Left Pain Descriptors / Indicators: Throbbing Pain Onset: 1 to 4 weeks ago Pain Frequency: Intermittent Pain Relieving Factors: Tylenol and motrin  Pain Relieving Factors: Tylenol and motrin  Current Medications & Allergies (verified) Allergies as of 11/01/2021   No Known Allergies      Medication List        Accurate as of Nov 01, 2021  1:50 PM. If you have any questions, ask your nurse or doctor.          AMBULATORY NON FORMULARY MEDICATION Single glucometer with lancets, test strips. Test daily.  Disp qs 3 months E11.9   B-complex with vitamin C tablet Take 1 tablet by mouth daily.   B-D UF III MINI PEN NEEDLES 31G X 5 MM Misc Generic drug: Insulin Pen Needle  USE AS DIRECTED   Basaglar KwikPen 100 UNIT/ML 35 UNITS AT BED INCREASE BY 2 UNITS EVERY 3 DAYS TIL FASTING SUGAR GOAL 120-150 MAX 60 UNITS PER DAY   Calcium 1000 + D 1000-20 MG-MCG Tabs Generic drug: Calcium Carb-Cholecalciferol Take 1 tablet by mouth daily.   cloNIDine 0.1 MG tablet Commonly known as: CATAPRES TAKE 1 TABLET BY MOUTH EVERY DAY   FLUoxetine 40 MG capsule Commonly known as: PROZAC TAKE 1 CAPSULE BY MOUTH EVERY DAY   gabapentin 300 MG capsule Commonly known as:  NEURONTIN TAKE 1 CAPSULE (300 MG TOTAL) BY MOUTH AT BEDTIME AS NEEDED (INSOMNIA).   OneTouch Delica Plus [id number] Misc TEST BLOOD SUGARS DAILY   OneTouch Verio test strip Generic drug: glucose blood TEST BLOOD SUGARS DAILY   pravastatin 80 MG tablet Commonly known as: PRAVACHOL TAKE 1 TABLET BY MOUTH EVERY DAY   Vitamin D 50 MCG (2000 UT) tablet Take 2,000 Units by mouth daily.        History (reviewed): Past Medical History:  Diagnosis Date   Anxiety    Arthritis    Borderline diabetic    Colon cancer ([place])    colon ca dx 02/2009, lung cancer   Depression    Depression with anxiety 09/19/2011   H/O colon cancer, stage II 09/19/2011   T3N0  [place] Sept 2010   High cholesterol    Hyperlipidemia 09/19/2011   Lung nodules 09/19/2011   granulomas   Osteoporosis 2022   Urinary urgency    Past Surgical History:  Procedure Laterality Date   APPENDECTOMY     AUGMENTATION MAMMAPLASTY     BREAST SURGERY  1978   augmentation   COLON SURGERY     2010   CRYO INTERCOSTAL NERVE BLOCK N/A 10/05/2014   Procedure: CRYO INTERCOSTAL NERVE BLOCK;  Surgeon: [name], MD;  Location: [facility];  Service: Thoracic;  Laterality: N/A;   LYMPH NODE DISSECTION Right 10/05/2014   Procedure: LYMPH NODE DISSECTION;  Surgeon: [name], MD;  Location: [facility];  Service: Thoracic;  Laterality: Right;   ORIF HUMERUS FRACTURE Left 10/01/2018   Procedure: OPEN REDUCTION INTERNAL FIXATION (ORIF) LEFT PROXIMAL HUMERUS FRACTURE;  Surgeon: [name], MD;  Location: [facility];  Service: Orthopedics;  Laterality: Left;   PARTIAL COLECTOMY Right 2010   SEGMENTECOMY Right 10/05/2014   Procedure: right lower lobe SEGMENTECTOMY;  Surgeon: [name], MD;  Location: [facility];  Service: Thoracic;  Laterality: Right;   VIDEO ASSISTED THORACOSCOPY (VATS)/WEDGE RESECTION Right 10/05/2014   Procedure: Right VIDEO ASSISTED THORACOSCOPY (VATS) with right lower lobe lung nodule WEDGE  RESECTION;  Surgeon: [name], MD;  Location: [facility];  Service: Thoracic;  Laterality: Right;   Family History  Problem Relation Age of Onset   Heart disease Mother    Colon polyps Sister    Heart disease Brother    Cancer Brother    Colon cancer Neg Hx    Esophageal cancer Neg Hx    Stomach cancer Neg Hx    Rectal cancer Neg Hx    Social History   Socioeconomic History   Marital status: Divorced    Spouse name: Not on file   Number of children: 0   Years of education: 16   Highest education level: Some college, no degree  Occupational History    Comment: Retired.  Tobacco Use   Smoking status: Former    Packs/day: 3.00    Years: 40.00    Pack years:  120.00    Types: Cigarettes    Start date: 22    Quit date: 02/06/2009    Years since quitting: 12.7   Smokeless tobacco: Never  Vaping Use   Vaping Use: Never used  Substance and Sexual Activity   Alcohol use: Not Currently    Comment: 1-2 beers every other week   Drug use: No   Sexual activity: Not Currently    Birth control/protection: Post-menopausal  Other Topics Concern   Not on file  Social History Narrative   Lives alone. She has been helping take care of her sister, who has Alzheimer's disease.   Social Determinants of Health   Financial Resource Strain: Low Risk    Difficulty of Paying Living Expenses: Not hard at all  Food Insecurity: No Food Insecurity   Worried About Charity fundraiser in the Last Year: Never true   Industry in the Last Year: Never true  Transportation Needs: No Transportation Needs   Lack of Transportation (Medical): No   Lack of Transportation (Non-Medical): No  Physical Activity: Insufficiently Active   Days of Exercise per Week: 2 days   Minutes of Exercise per Session: 10 min  Stress: No Stress Concern Present   Feeling of Stress : Not at all  Social Connections: Moderately Isolated   Frequency of Communication with Friends and Family: Three times a week    Frequency of Social Gatherings with Friends and Family: Twice a week   Attends Religious Services: 1 to 4 times per year   Active Member of [employer] or Organizations: No   Attends Archivist Meetings: Never   Marital Status: Divorced    Activities of Daily Living    10/28/2021   11:35 AM  In your present state of health, do you have any difficulty performing the following activities:  Hearing? 1  Vision? 0  Difficulty concentrating or making decisions? 0  Walking or climbing stairs? 1  Dressing or bathing? 0  Doing errands, shopping? 0  Preparing Food and eating ? N  Using the Toilet? N  In the past six months, have you accidently leaked urine? Y  Do you have problems with loss of bowel control? N  Managing your Medications? N  Managing your Finances? N  Housekeeping or managing your Housekeeping? N    Patient Education/ Literacy How often do you need to have someone help you when you read instructions, pamphlets, or other written materials from your doctor or pharmacy?: 1 - Never What is the last grade level you completed in school?: 4 years of college  Exercise Current Exercise Habits: The patient does not participate in regular exercise at present, Exercise limited by: orthopedic condition(s) (sprained foot)  Diet Patient reports consuming 3 meals a day and 0 snack(s) a day Patient reports that her primary diet is: Regular Patient reports that she does have regular access to food.   Depression Screen    11/01/2021    1:45 PM 07/05/2021    2:34 PM 03/31/2021    1:50 PM 10/25/2020    2:04 PM 04/01/2020    1:46 PM 12/11/2018    2:22 PM 06/12/2018    1:57 PM  PHQ 2/9 Scores  PHQ - 2 Score 0 0 0 0 1 1 2   PHQ- 9 Score  0 0   6 3     Fall Risk    10/28/2021   11:35 AM 07/05/2021    2:34 PM 03/31/2021    1:50  PM 10/25/2020    2:04 PM 04/01/2020    1:46 PM  Fall Risk   Falls in the past year? 1 0 0 1 1  Number falls in past yr: 0 0 0 0 1  Injury with Fall? 1  0 0 0 0  Risk for fall due to :  No Fall Risks No Fall Risks No Fall Risks   Follow up  Falls evaluation completed Falls evaluation completed Falls evaluation completed;Education provided      Objective:  DHRUVI CRENSHAW seemed alert and oriented and she participated appropriately during our telephone visit.  Blood Pressure Weight BMI  BP Readings from Last 3 Encounters:  10/27/21 100/72  10/03/21 108/67  07/25/21 138/86   Wt Readings from Last 3 Encounters:  10/27/21 176 lb 12.8 oz (80.2 kg)  10/03/21 177 lb (80.3 kg)  07/25/21 176 lb 12.8 oz (80.2 kg)   BMI Readings from Last 1 Encounters:  10/27/21 26.88 kg/m    *Unable to obtain current vital signs, weight, and BMI due to telephone visit type  Hearing/Vision  Aleayah did not seem to have difficulty with hearing/understanding during the telephone conversation Reports that she has had a formal eye exam by an eye care professional within the past year Reports that she has not had a formal hearing evaluation within the past year *Unable to fully assess hearing and vision during telephone visit type  Cognitive Function:    11/01/2021    1:46 PM 10/25/2020    2:15 PM  6CIT Screen  What Year? 0 points 0 points  What month? 0 points 0 points  What time? 0 points 0 points  Count back from 20 0 points 0 points  Months in reverse 0 points 0 points  Repeat phrase 0 points 0 points  Total Score 0 points 0 points   (Normal:0-7, Significant for Dysfunction: >8)  Normal Cognitive Function Screening: Yes   Immunization & Health Maintenance Record Immunization History  Administered Date(s) Administered   Fluad Quad(high Dose 65+) 03/13/2019, 04/01/2020, 03/31/2021   Influenza,inj,Quad PF,6+ Mos 03/20/2018   PFIZER(Purple Top)SARS-COV-2 Vaccination 08/08/2019, 09/03/2019, 04/12/2020, 01/20/2021   Pneumococcal Conjugate-13 08/22/2016   Pneumococcal Polysaccharide-23 07/05/2021   Pneumococcal-Unspecified 02/03/2009   Tdap  10/02/2018    Health Maintenance  Topic Date Due   COVID-19 Vaccine (5 - Booster for [place] series) 11/17/2021 (Originally 03/17/2021)   Zoster Vaccines- Shingrix (1 of 2) 02/01/2022 (Originally 01/21/1965)   OPHTHALMOLOGY EXAM  11/24/2021   INFLUENZA VACCINE  01/03/2022   HEMOGLOBIN A1C  04/05/2022   FOOT EXAM  06/10/2022   DEXA SCAN  11/11/2022   MAMMOGRAM  12/30/2022   COLONOSCOPY (Pts 45-70yrs Insurance coverage will need to be confirmed)  01/29/2025   TETANUS/TDAP  10/01/2028   Pneumonia Vaccine 38+ Years old  Completed   Hepatitis C Screening  Completed   HPV VACCINES  Aged Out       Assessment  This is a routine wellness examination for [employer].  Health Maintenance: Due or Overdue There are no preventive care reminders to display for this patient.   Kelli [name] does not need a referral for Community Assistance: Care Management:   no Social Work:    no Prescription Assistance:  no Nutrition/Diabetes Education:  no   Plan:  Personalized Goals  Goals Addressed               This Visit's Progress     Patient Stated (pt-stated)  Would like to loose some weight.        Personalized Health Maintenance & Screening Recommendations  Shingrix   Lung Cancer Screening Recommended: no (Low Dose CT Chest recommended if Age 66-80 years, 30 pack-year currently smoking OR have quit w/in past 15 years) Hepatitis C Screening recommended: no HIV Screening recommended: no  Advanced Directives: Written information was not prepared per patient's request.  Referrals & Orders No orders of the defined types were placed in this encounter.   Follow-up Plan Follow-up with Kelli Nutting, DO as planned Schedule your shingrix at your pharmacy. Medicare wellness visit in one year. Patient will access AVS on my chart.   I have personally reviewed and noted the following in the patient's chart:   Medical and social history Use of alcohol, tobacco or illicit  drugs  Current medications and supplements Functional ability and status Nutritional status Physical activity Advanced directives List of other physicians Hospitalizations, surgeries, and ER visits in previous 12 months Vitals Screenings to include cognitive, depression, and falls Referrals and appointments  In addition, I have reviewed and discussed with Kelli [name] certain preventive protocols, quality metrics, and best practice recommendations. A written personalized care plan for preventive services as well as general preventive health recommendations is available and can be mailed to the patient at her request.      Tinnie Gens, RN  11/01/2021

## 2021-11-01 NOTE — Progress Notes (Signed)
Small avulsion fracture seen left ankle likely talked about.

## 2021-11-03 ENCOUNTER — Encounter: Payer: Self-pay | Admitting: Rehabilitative and Restorative Service Providers"

## 2021-11-03 ENCOUNTER — Ambulatory Visit
Payer: No Typology Code available for payment source | Attending: Family Medicine | Admitting: Rehabilitative and Restorative Service Providers"

## 2021-11-03 DIAGNOSIS — M6281 Muscle weakness (generalized): Secondary | ICD-10-CM | POA: Insufficient documentation

## 2021-11-03 DIAGNOSIS — M25572 Pain in left ankle and joints of left foot: Secondary | ICD-10-CM | POA: Diagnosis not present

## 2021-11-03 DIAGNOSIS — M533 Sacrococcygeal disorders, not elsewhere classified: Secondary | ICD-10-CM | POA: Insufficient documentation

## 2021-11-03 DIAGNOSIS — R293 Abnormal posture: Secondary | ICD-10-CM | POA: Diagnosis not present

## 2021-11-03 DIAGNOSIS — R29898 Other symptoms and signs involving the musculoskeletal system: Secondary | ICD-10-CM | POA: Insufficient documentation

## 2021-11-03 DIAGNOSIS — S82892A Other fracture of left lower leg, initial encounter for closed fracture: Secondary | ICD-10-CM | POA: Insufficient documentation

## 2021-11-03 DIAGNOSIS — R269 Unspecified abnormalities of gait and mobility: Secondary | ICD-10-CM | POA: Insufficient documentation

## 2021-11-03 NOTE — Therapy (Signed)
[place], [place], [address] Phone: [phone]   Fax:  [phone]  Physical Therapy Evaluation Rationale for Evaluation and Treatment Rehabilitation  Patient Details  Name: Kelli [name] MRN: [id number] Date of Birth: 06-Aug-1945 Referring Provider (PT): Dr [name]   Encounter Date: 11/03/2021   PT End of Session - 11/03/21 1258     Visit Number 1    Number of Visits 12    Date for PT Re-Evaluation 12/15/21    PT Start Time 4235    PT Stop Time 1238    PT Time Calculation (min) 53 min    Activity Tolerance Patient tolerated treatment well             Past Medical History:  Diagnosis Date   Anxiety    Arthritis    Borderline diabetic    Colon cancer ([place])    colon ca dx 02/2009, lung cancer   Depression    Depression with anxiety 09/19/2011   H/O colon cancer, stage II 09/19/2011   T3N0  Cecum Sept 2010   High cholesterol    Hyperlipidemia 09/19/2011   Lung nodules 09/19/2011   granulomas   Osteoporosis 2022   Urinary urgency     Past Surgical History:  Procedure Laterality Date   APPENDECTOMY     AUGMENTATION MAMMAPLASTY     BREAST SURGERY  1978   augmentation   COLON SURGERY     2010   CRYO INTERCOSTAL NERVE BLOCK N/A 10/05/2014   Procedure: CRYO INTERCOSTAL NERVE BLOCK;  Surgeon: [name], MD;  Location: [place];  Service: Thoracic;  Laterality: N/A;   LYMPH NODE DISSECTION Right 10/05/2014   Procedure: LYMPH NODE DISSECTION;  Surgeon: [name], MD;  Location: [place];  Service: Thoracic;  Laterality: Right;   ORIF HUMERUS FRACTURE Left 10/01/2018   Procedure: OPEN REDUCTION INTERNAL FIXATION (ORIF) LEFT PROXIMAL HUMERUS FRACTURE;  Surgeon: [name], MD;  Location: [place];  Service: Orthopedics;  Laterality: Left;   PARTIAL COLECTOMY Right 2010   SEGMENTECOMY Right 10/05/2014   Procedure: right lower lobe SEGMENTECTOMY;  Surgeon: [name], MD;  Location: [facility];  Service: Thoracic;  Laterality: Right;   VIDEO ASSISTED THORACOSCOPY (VATS)/WEDGE RESECTION Right 10/05/2014   Procedure: Right VIDEO ASSISTED THORACOSCOPY (VATS) with right lower lobe lung nodule WEDGE RESECTION;  Surgeon: [name], MD;  Location: [facility];  Service: Thoracic;  Laterality: Right;    There were no vitals filed for this visit.    Subjective Assessment - 11/03/21 1158     Subjective Patient reports that she sprained her Lt ankle 10/20/21 and waited for ~ 2 weeks before going into the MD. Xrays showed Avulsion fx of Lt talus. She was placed in ankle AFO WBAT    Pertinent History Fx Lt shoulder with ORIF; Rt shoulder pain; HTN; arthritis; osteoporosis    Diagnostic tests Small ossicle just dorsal to the talar neck, likely a small acute avulsion injury.    Currently in Pain? Yes    Pain Score 2     Pain Location Foot    Pain Orientation Left    Pain Descriptors / Indicators Aching;Nagging;Shooting    Pain Type Acute pain    Pain Onset 1 to 4 weeks ago    Pain Frequency Intermittent    Aggravating Factors  walking    Pain Relieving Factors tylenol; motrin  [facility] PT Assessment - 11/03/21 0001       Assessment   Medical Diagnosis Avulsion fx Lt talus    Referring Provider (PT) Dr [name]    Onset Date/Surgical Date 10/20/21    Hand Dominance Right    Next MD Visit 11/17/21    Prior Therapy here for shoulders      Precautions   Precautions Other (comment)    Required Braces or Orthoses Other Brace/Splint    Other Brace/Splint ankle AFO      Restrictions   Weight Bearing Restrictions No      Balance Screen   Has the patient fallen in the past 6 months Yes    How many times? 1    Has the patient had a decrease in activity level because of a fear of falling?  No    Is the patient reluctant to leave their home because of a fear of falling?  No      Home Ecologist  residence    Living Arrangements Alone      Prior Function   Level of Independence Independent    Vocation Retired    Biomedical scientist household chores; gardening/yard work    Leisure exercise in the pool      Observation/Other Assessments   Observations dorsum of Lt foot red - note improved color with elevation and retrograde massage; skin dry and in tact    Focus on Therapeutic Outcomes (FOTO)  43      Observation/Other Assessments-Edema    Edema --   moderate edema Lt foot and lateral ankle     Sensation   Additional Comments itching and some burning pain in the dorsum of Lt foot      AROM   Right Ankle Dorsiflexion 20    Right Ankle Plantar Flexion 58    Right Ankle Inversion 24    Right Ankle Eversion 18    Left Ankle Dorsiflexion 14    Left Ankle Plantar Flexion 48    Left Ankle Inversion 19    Left Ankle Eversion 12      Strength   Overall Strength Comments WFL's Rt LE; Lt hipknee - not tested at ankle      Flexibility   Hamstrings tight bilat      Palpation   Palpation comment tender to palpation lateral malleolus; dorsum of Lt foot      Ambulation/Gait   Gait Comments ambulates with AFO Lt ankle no assistive device limp with wt bearing Lt LE with decreased Lt ankle DF in gait cycle                        Objective measurements completed on examination: See above findings.       [place] Adult PT Treatment/Exercise - 11/03/21 0001       Therapeutic Activites    Therapeutic Activities Other Therapeutic Activities    Other Therapeutic Activities retrograde massage Lt foot and ankle; elevation for edema control      Manual Therapy   Manual therapy comments manual work/desentization Lt foot across the dorsum of foot and to lateral ankle    Soft tissue mobilization retrograde massage Lt foot/ankle      Ankle Exercises: Stretches   Other Stretch hamstring stretch 30 sec x 3 reps bilat      Ankle Exercises: Seated   Towel Crunch 5 reps     Other Seated Ankle Exercises toe yoga x 10  reps      Ankle Exercises: Supine   Other Supine Ankle Exercises ankle pumps; circle; alphabet with Lt LE elevated on bloster                     PT Education - 11/03/21 1235     Education Details POC HEP retrograde massage with elevation    Person(s) Educated Patient    Methods Explanation;Demonstration;Tactile cues;Verbal cues;Handout    Comprehension Verbalized understanding;Returned demonstration;Verbal cues required;Tactile cues required                 PT Long Term Goals - 11/03/21 1305       PT LONG TERM GOAL #1   Title Patient to report 75-100% improvement in sensation and absence of pain Lt foot and ankle    Time 6    Period Weeks    Target Date 12/15/21      PT LONG TERM GOAL #2   Title Increase strength Lt ankle to 4/5 to 5/5    Time 6    Period Weeks    Status New    Target Date 12/15/21      PT LONG TERM GOAL #3   Title increase AROM Lt ankle to WFL's and ~ equal to AROM Rt ankle    Time 6    Period Weeks    Status New    Target Date 12/15/21      PT LONG TERM GOAL #4   Title Indpendent in HEP (including aquatic program as indicated)    Time 6    Period Weeks    Status New    Target Date 12/15/21      PT LONG TERM GOAL #5   Title Improve functional limitation score to 60    Time 6    Period Weeks    Status New    Target Date 12/15/21                    Plan - 11/03/21 1258     Clinical Impression Statement Patient presents s/p Lt ankle sprain with avulsion fx Lt talus following a fall 10/20/21. She was seen by MD 10/27/21 and placed in AFO, WBAT. She has continued edema and discoloration of Lt foot and ankle as well as abnormal sensation through the dorsum of Lt foot. Patient has decreased ankle ROM; decresaed functional strength; abnormal gait; decreased ADL tolerance. Patient will benefit from PT to address problems identified.    Stability/Clinical Decision Making  Stable/Uncomplicated    Clinical Decision Making Low    Rehab Potential Good    PT Frequency 2x / week    PT Duration 6 weeks    PT Treatment/Interventions ADLs/Self Care Home Management;Aquatic Therapy;Cryotherapy;Electrical Stimulation;Iontophoresis 4mg /ml Dexamethasone;Moist Heat;Ultrasound;Gait training;Stair training;Functional mobility training;Therapeutic activities;Therapeutic exercise;Balance training;Neuromuscular re-education;Manual techniques;Patient/family education;Dry needling;Passive range of motion;Taping;Vasopneumatic Device    PT Next Visit Plan review and progress HEP; massage/myofacial release Lt ankle and foot; gait training; modalites as indicated    PT Home Exercise Plan [facility]    Consulted and Agree with Plan of Care Patient             Patient will benefit from skilled therapeutic intervention in order to improve the following deficits and impairments:  Abnormal gait, Decreased range of motion, Increased fascial restricitons, Decreased activity tolerance, Pain, Decreased balance, Impaired flexibility, Improper body mechanics, Decreased mobility, Decreased strength, Increased edema, Impaired sensation  Visit Diagnosis: Acute left ankle pain  Other symptoms and signs  involving the musculoskeletal system  Abnormal gait  Muscle weakness (generalized)     Problem List Patient Active Problem List   Diagnosis Date Noted   Avulsion fracture of left ankle 10/27/2021   Skin lesion 07/05/2021   Insomnia 03/31/2021   Osteoporosis 11/12/2020   Closed fracture of left proximal humerus 09/26/2018   Squamous cell carcinoma 03/05/2017   Echocardiogram shows left ventricular diastolic dysfunction 32/41/9914   Heart palpitations 07/25/2016   Need for vaccination for pneumococcus 07/13/2016   Osteoarthritis of hand, right 08/06/2015   Diabetes mellitus type 2, uncomplicated ([place]) 44/58/4835   Lung cancer, lower lobe ([place]) 10/09/2014   Hematochezia 04/01/2013    H/O colon cancer, stage II 09/19/2011   Depression with anxiety 09/19/2011   Hyperlipidemia 09/19/2011    Chin Wachter Nilda Simmer, PT, MPH  11/03/2021, 1:08 PM  [facility] [place], [place], [address] Phone: [phone]   Fax:  [phone]  Name: LAYLIA MUI MRN: [id number] Date of Birth: 1945/08/21

## 2021-11-03 NOTE — Patient Instructions (Signed)
Access Code: [facility] URL: [email or web address] Date: 11/03/2021 Prepared by: Gillermo Murdoch  Exercises - Hooklying Hamstring Stretch with Strap  - 2 x daily - 7 x weekly - 1 sets - 3 reps - 30 sec  hold - Ankle Pumps in Elevation  - 2 x daily - 7 x weekly - 1 sets - 10-20 reps - Ankle Circles in Elevation  - 2 x daily - 7 x weekly - 1 sets - 10-15 reps - Ankle Alphabet in Elevation  - 2 x daily - 7 x weekly - 1 sets - 3 reps - Towel Scrunches  - 2 x daily - 7 x weekly - 1 sets - 3 reps - 30 sec  hold - Toe Yoga - Alternating Great Toe and Lesser Toe Extension  - 2 x daily - 7 x weekly - 1 sets - 3 reps - 30 sec  hold

## 2021-11-08 ENCOUNTER — Ambulatory Visit (INDEPENDENT_AMBULATORY_CARE_PROVIDER_SITE_OTHER): Payer: No Typology Code available for payment source

## 2021-11-08 ENCOUNTER — Encounter: Payer: Self-pay | Admitting: Family Medicine

## 2021-11-08 ENCOUNTER — Ambulatory Visit (INDEPENDENT_AMBULATORY_CARE_PROVIDER_SITE_OTHER): Payer: No Typology Code available for payment source | Admitting: Family Medicine

## 2021-11-08 ENCOUNTER — Telehealth: Payer: Self-pay | Admitting: Family Medicine

## 2021-11-08 VITALS — BP 104/60 | HR 75 | Ht 68.0 in | Wt 179.2 lb

## 2021-11-08 DIAGNOSIS — M533 Sacrococcygeal disorders, not elsewhere classified: Secondary | ICD-10-CM | POA: Diagnosis not present

## 2021-11-08 MED ORDER — HYDROCODONE-ACETAMINOPHEN 5-325 MG PO TABS: 1.0000 | ORAL_TABLET | Freq: Four times a day (QID) | ORAL | 0 refills | Status: DC | PRN

## 2021-11-08 NOTE — Patient Instructions (Addendum)
Good to see you today.  Please get an Xray today before you leave.  I've referred you to PT at [facility].  Their office will call you to schedule but please let us know if you don't hear from them in one week regarding scheduling.  Coccyx cushion  Follow-up: in 3 weeks. Delay the appointment on the 15th.

## 2021-11-08 NOTE — Progress Notes (Signed)
   I, [name], LAT, ATC, am serving as scribe for Dr. [name].  Kelli [name] is a 76 y.o. female who presents to [place] at [facility] today for buttock pain. Pt was previously seen by Dr. [name] on 10/27/21 for an avulsion fx of her L ankle. Today, pt reports buttocks pain after suffering a fall on 10/25/21 when she slipped while getting out of bed and hit her lower back/buttocks along the side of her bed . Pt locates pain to her sacrum and coccyx.  Radiating pain: yes into her lower back Aggravates: increased pain throughout the day Treatments tried: motrin; tylenol; leftover hydrocodone   Pertinent review of systems: No fevers or chills  Relevant historical information: History of lung cancer.   Exam:  BP 104/60 (BP Location: Right Arm, Patient Position: Sitting, Cuff Size: Normal)   Pulse 75   Ht 5\' 8"  (1.727 m)   Wt 179 lb 3.2 oz (81.3 kg)   LMP  (LMP Unknown)   SpO2 96%   BMI 27.25 kg/m  General: Well Developed, well nourished, and in no acute distress.   MSK: Right sacrum nontender lower portion of lumbar spine and upper portion of sacrum. Tender palpation of the lower sacrum/coccyx region along midline. Normal lumbar motion. Lower extremity strength is intact. Reflexes are intact.    Lab and Radiology Results   X-ray images sacrum coccyx obtained today personally and independently interpreted. Concern right lower sacrum coccyx fracture.  Nondisplaced. Await for radiology review.   Assessment and Plan: 76 y.o. female with buttocks pain after a fall with concern for coccyx fracture.  Plan for adequate cushioning with offloading pad.  Recheck in 3 weeks.  Limited hydrocodone for pain control.   PDMP reviewed during this encounter. Orders Placed This Encounter  Procedures   DG Sacrum/Coccyx    Standing Status:   Future    Number of Occurrences:   1    Standing Expiration Date:   11/09/2022    Order Specific Question:   Reason for  Exam (SYMPTOM  OR DIAGNOSIS REQUIRED)    Answer:   eval pain    Order Specific Question:   Preferred imaging location?    Answer:   [facility]   Ambulatory referral to Physical Therapy    Referral Priority:   Routine    Referral Type:   Physical Medicine    Referral Reason:   Specialty Services Required    Requested Specialty:   Physical Therapy    Number of Visits Requested:   1   Meds ordered this encounter  Medications   HYDROcodone-acetaminophen (NORCO/VICODIN) 5-325 MG tablet    Sig: Take 1 tablet by mouth every 6 (six) hours as needed.    Dispense:  15 tablet    Refill:  0     Discussed warning signs or symptoms. Please see discharge instructions. Patient expresses understanding.   The above documentation has been reviewed and is accurate and complete [name], M.D.

## 2021-11-08 NOTE — Telephone Encounter (Signed)
[facility] not [facility] the 5/325 hydrocodone, on the 10. Per pt, [facility] has sent a message about this. RX needs to be changed to reflect only available dosage.

## 2021-11-09 ENCOUNTER — Ambulatory Visit: Payer: No Typology Code available for payment source | Admitting: Physical Therapy

## 2021-11-09 DIAGNOSIS — R269 Unspecified abnormalities of gait and mobility: Secondary | ICD-10-CM

## 2021-11-09 DIAGNOSIS — M25572 Pain in left ankle and joints of left foot: Secondary | ICD-10-CM | POA: Diagnosis not present

## 2021-11-09 DIAGNOSIS — M6281 Muscle weakness (generalized): Secondary | ICD-10-CM

## 2021-11-09 DIAGNOSIS — R29898 Other symptoms and signs involving the musculoskeletal system: Secondary | ICD-10-CM

## 2021-11-09 MED ORDER — HYDROCODONE-ACETAMINOPHEN 10-325 MG PO TABS: 0.5000 | ORAL_TABLET | Freq: Three times a day (TID) | ORAL | 0 refills | Status: DC | PRN

## 2021-11-09 NOTE — Telephone Encounter (Signed)
Called pt and left detailed message w/ info regarding updated hydrocodone rx and that she is to take 1/2 pill at a time at the higher 10mg  dosage.

## 2021-11-09 NOTE — Telephone Encounter (Signed)
I have prescribed hydrocodone 10-325.  Take one half of a pill at a time.  It is the same dose.

## 2021-11-09 NOTE — Therapy (Signed)
[place], [place], [address] Phone: [phone]   Fax:  [phone]  Physical Therapy Treatment  Patient Details  Name: Kelli [name] MRN: [id number] Date of Birth: [birth year] Referring Provider (PT): Dr [name]   Encounter Date: 11/09/2021 Rationale for Evaluation and Treatment Rehabilitation   PT End of Session - 11/09/21 1226     Visit Number 2    Number of Visits 12    Date for PT Re-Evaluation 12/15/21    PT Start Time 1150    PT Stop Time 1230    PT Time Calculation (min) 40 min    Activity Tolerance Patient tolerated treatment well    Behavior During Therapy [facility] for tasks assessed/performed             Past Medical History:  Diagnosis Date   Anxiety    Arthritis    Borderline diabetic    Colon cancer ([place])    colon ca dx 02/2009, lung cancer   Depression    Depression with anxiety 09/19/2011   H/O colon cancer, stage II 09/19/2011   T3N0  Cecum Sept 2010   High cholesterol    Hyperlipidemia 09/19/2011   Lung nodules 09/19/2011   granulomas   Osteoporosis 2022   Urinary urgency     Past Surgical History:  Procedure Laterality Date   APPENDECTOMY     AUGMENTATION MAMMAPLASTY     BREAST SURGERY  1978   augmentation   COLON SURGERY     2010   CRYO INTERCOSTAL NERVE BLOCK N/A 10/05/2014   Procedure: CRYO INTERCOSTAL NERVE BLOCK;  Surgeon: [name], MD;  Location: [place];  Service: Thoracic;  Laterality: N/A;   LYMPH NODE DISSECTION Right 10/05/2014   Procedure: LYMPH NODE DISSECTION;  Surgeon: [name], MD;  Location: [place];  Service: Thoracic;  Laterality: Right;   ORIF HUMERUS FRACTURE Left 10/01/2018   Procedure: OPEN REDUCTION INTERNAL FIXATION (ORIF) LEFT PROXIMAL HUMERUS FRACTURE;  Surgeon: [name], MD;  Location: [place];  Service: Orthopedics;  Laterality: Left;   PARTIAL COLECTOMY Right 2010   SEGMENTECOMY Right 10/05/2014    Procedure: right lower lobe SEGMENTECTOMY;  Surgeon: [name], MD;  Location: [place];  Service: Thoracic;  Laterality: Right;   VIDEO ASSISTED THORACOSCOPY (VATS)/WEDGE RESECTION Right 10/05/2014   Procedure: Right VIDEO ASSISTED THORACOSCOPY (VATS) with right lower lobe lung nodule WEDGE RESECTION;  Surgeon: [name], MD;  Location: [place];  Service: Thoracic;  Laterality: Right;    There were no vitals filed for this visit.   Subjective Assessment - 11/09/21 1153     Subjective Pt with new referral for coccyx pain, will schedule eval next visit. Pt states she has felt "blah" all day. Her ankle has shooting pain up the leg    Currently in Pain? Yes    Pain Score 2     Pain Location Ankle    Pain Orientation Left    Pain Descriptors / Indicators Throbbing    Pain Type Acute pain                [facility] PT Assessment - 11/09/21 0001       Assessment   Medical Diagnosis Avulsion fx Lt talus    Referring Provider (PT) Dr [name]    Onset Date/Surgical Date 10/20/21    Hand Dominance Right    Next MD Visit 11/17/21    Prior Therapy here for shoulders  [place] Adult PT Treatment/Exercise - 11/09/21 0001       Ankle Exercises: Seated   ABC's 2 reps    Towel Crunch --   2 x 1 min   BAPS Level 2;Sitting;15 reps    Other Seated Ankle Exercises toe yoga x 10 reps    Other Seated Ankle Exercises rocker board PF/DF x 1 min      Ankle Exercises: Supine   T-Band red TB 20x each direction      Ankle Exercises: Stretches   Other Stretch hamstring stretch 30 sec x 3 reps bilat                          PT Long Term Goals - 11/03/21 1305       PT LONG TERM GOAL #1   Title Patient to report 75-100% improvement in sensation and absence of pain Lt foot and ankle    Time 6    Period Weeks    Target Date 12/15/21      PT LONG TERM GOAL #2   Title Increase strength Lt ankle to 4/5 to 5/5    Time 6     Period Weeks    Status New    Target Date 12/15/21      PT LONG TERM GOAL #3   Title increase AROM Lt ankle to WFL's and ~ equal to AROM Rt ankle    Time 6    Period Weeks    Status New    Target Date 12/15/21      PT LONG TERM GOAL #4   Title Indpendent in HEP (including aquatic program as indicated)    Time 6    Period Weeks    Status New    Target Date 12/15/21      PT LONG TERM GOAL #5   Title Improve functional limitation score to 60    Time 6    Period Weeks    Status New    Target Date 12/15/21                   Plan - 11/09/21 1226     Clinical Impression Statement Pt with good tolerance to addition of coordination and NMR with BAPS board with min tactile cues. Pt with good tolerance to addition of theraband resistance exercises. She has new referral for coccyx fracture so will be eval'd next visit    PT Next Visit Plan progres HEP, standing ankle strength and balance    PT Home Exercise Plan [place] and Agree with Plan of Care Patient             Patient will benefit from skilled therapeutic intervention in order to improve the following deficits and impairments:     Visit Diagnosis: Acute left ankle pain  Other symptoms and signs involving the musculoskeletal system  Abnormal gait  Muscle weakness (generalized)     Problem List Patient Active Problem List   Diagnosis Date Noted   Avulsion fracture of left ankle 10/27/2021   Skin lesion 07/05/2021   Insomnia 03/31/2021   Osteoporosis 11/12/2020   Closed fracture of left proximal humerus 09/26/2018   Squamous cell carcinoma 03/05/2017   Echocardiogram shows left ventricular diastolic dysfunction 48/54/6270   Heart palpitations 07/25/2016   Need for vaccination for pneumococcus 07/13/2016   Osteoarthritis of hand, right 08/06/2015   Diabetes mellitus type 2, uncomplicated ([place]) 35/00/9381   Lung cancer, lower lobe (  [place]) 10/09/2014   Hematochezia 04/01/2013   H/O colon  cancer, stage II 09/19/2011   Depression with anxiety 09/19/2011   Hyperlipidemia 09/19/2011    Earlisha Sharples, PT 11/09/2021, 12:29 PM  [facility] [place], [place], [address] Phone: [phone]   Fax:  [phone]  Name: Kelli [name] MRN: [id number] Date of Birth: 1945/10/21

## 2021-11-10 NOTE — Progress Notes (Signed)
X-ray of the sacrum and coccyx shows a fracture of the sacrum.  Reduce weightbearing with a walker is the correct treatment.  Recommend recheck in 3 weeks as scheduled.

## 2021-11-14 ENCOUNTER — Ambulatory Visit: Payer: No Typology Code available for payment source | Admitting: Rehabilitative and Restorative Service Providers"

## 2021-11-14 ENCOUNTER — Encounter: Payer: Self-pay | Admitting: Rehabilitative and Restorative Service Providers"

## 2021-11-14 DIAGNOSIS — M533 Sacrococcygeal disorders, not elsewhere classified: Secondary | ICD-10-CM

## 2021-11-14 DIAGNOSIS — M25572 Pain in left ankle and joints of left foot: Secondary | ICD-10-CM

## 2021-11-14 DIAGNOSIS — M6281 Muscle weakness (generalized): Secondary | ICD-10-CM

## 2021-11-14 DIAGNOSIS — R269 Unspecified abnormalities of gait and mobility: Secondary | ICD-10-CM

## 2021-11-14 DIAGNOSIS — R29898 Other symptoms and signs involving the musculoskeletal system: Secondary | ICD-10-CM

## 2021-11-14 DIAGNOSIS — R293 Abnormal posture: Secondary | ICD-10-CM

## 2021-11-14 NOTE — Therapy (Signed)
[place] [facility] [facility] [place], [place], [address] Phone: [phone]   Fax:  [phone]  Physical Therapy Treatment Rationale for Evaluation and Treatment Rehabilitation  Patient Details  Name: Kelli [name] MRN: [id number] Date of Birth: 07-Oct-1945 Referring Provider (PT): Dr [name]   Encounter Date: 11/14/2021   PT End of Session - 11/14/21 1459     Visit Number 3    Number of Visits 12    Date for PT Re-Evaluation 12/15/21    PT Start Time 7035    PT Stop Time 1545    PT Time Calculation (min) 54 min    Activity Tolerance Patient tolerated treatment well             Past Medical History:  Diagnosis Date   Anxiety    Arthritis    Borderline diabetic    Colon cancer ([place])    colon ca dx 02/2009, lung cancer   Depression    Depression with anxiety 09/19/2011   H/O colon cancer, stage II 09/19/2011   T3N0  Cecum Sept 2010   High cholesterol    Hyperlipidemia 09/19/2011   Lung nodules 09/19/2011   granulomas   Osteoporosis 2022   Urinary urgency     Past Surgical History:  Procedure Laterality Date   APPENDECTOMY     AUGMENTATION MAMMAPLASTY     BREAST SURGERY  1978   augmentation   COLON SURGERY     2010   CRYO INTERCOSTAL NERVE BLOCK N/A 10/05/2014   Procedure: CRYO INTERCOSTAL NERVE BLOCK;  Surgeon: [name], MD;  Location: [facility];  Service: Thoracic;  Laterality: N/A;   LYMPH NODE DISSECTION Right 10/05/2014   Procedure: LYMPH NODE DISSECTION;  Surgeon: [name], MD;  Location: [facility];  Service: Thoracic;  Laterality: Right;   ORIF HUMERUS FRACTURE Left 10/01/2018   Procedure: OPEN REDUCTION INTERNAL FIXATION (ORIF) LEFT PROXIMAL HUMERUS FRACTURE;  Surgeon: [name], MD;  Location: [facility];  Service: Orthopedics;  Laterality: Left;   PARTIAL COLECTOMY Right 2010   SEGMENTECOMY Right 10/05/2014   Procedure: right lower lobe SEGMENTECTOMY;  Surgeon: [name], MD;  Location: [facility];  Service: Thoracic;  Laterality: Right;   VIDEO ASSISTED THORACOSCOPY (VATS)/WEDGE RESECTION Right 10/05/2014   Procedure: Right VIDEO ASSISTED THORACOSCOPY (VATS) with right lower lobe lung nodule WEDGE RESECTION;  Surgeon: [name], MD;  Location: [facility];  Service: Thoracic;  Laterality: Right;    There were no vitals filed for this visit.   Subjective Assessment - 11/14/21 1501     Subjective Foot and ankle are improving. Patient no longer wears ASO all the time - out of brace at home but wears the brace when out of the house. MD diagnosed pt with fracture of sacrum at S5 area per x-ray. She fell backwards on to buttocks and had immediate pain in the buttocks. She was seen my MD and referred to PT coccyx pain.    Currently in Pain? Yes    Pain Score 0-No pain   occasional pain in the top of foot and in ankle with certain movements   Pain Location Ankle    Pain Orientation Left    Pain Descriptors / Indicators Sharp;Throbbing    Pain Type Acute pain    Pain Onset 1 to 4 weeks ago    Pain Frequency Intermittent    Multiple Pain Sites Yes    Pain Score 1    Pain  Location Coccyx    Pain Orientation Posterior    Pain Descriptors / Indicators Dull;Aching    Pain Type Acute pain    Pain Onset 1 to 4 weeks ago    Pain Frequency Intermittent    Aggravating Factors  sitting    Pain Relieving Factors moving ; rubbing                [facility] PT Assessment - 11/14/21 0001       Assessment   Medical Diagnosis Avulsion fx Lt talus; coccyx pain    Referring Provider (PT) Dr [name]    Onset Date/Surgical Date 10/20/21    Hand Dominance Right    Next MD Visit 11/17/21    Prior Therapy here for shoulders      AROM   Overall AROM Comments trunk ROM is WFL's - tight at end ranges especially in forward flexion      Strength   Overall Strength Comments WFL's Rt LE; Lt hipknee - not tested at ankle      Flexibility   Hamstrings tight  bilat      Palpation   Palpation comment tender to palpation lateral malleolus; dorsum of Lt foot; tender to palpation gluteal fold      Ambulation/Gait   Gait Comments ambulates with AFO Lt ankle no assistive device minimal limp with wt bearing Lt LE with decreased Lt ankle DF in gait cycle                           [facility] Adult PT Treatment/Exercise - 11/14/21 0001       Self-Care   Self-Care Other Self-Care Comments    Other Self-Care Comments  plantar massage/myofacial release with golf ball(issued golf ball for home)      Knee/Hip Exercises: Stretches   Passive Hamstring Stretch Right;Left;2 reps;30 seconds    Passive Hamstring Stretch Limitations supine with strap      Knee/Hip Exercises: Standing   Other Standing Knee Exercises wt shift side to side      Knee/Hip Exercises: Prone   Other Prone Exercises glut set 5 sec x 10 reps (sub max contraction)      Manual Therapy   Manual therapy comments manual work/desentization Lt foot across the dorsum of foot and to lateral ankle    Soft tissue mobilization retrograde massage Lt foot/ankle    Kinesiotex Edema;Facilitate Muscle      Kinesiotix   Edema basket weave dorsum of Lt foot; 1 strip lateral ankle for support; 1 strip posteriorlateral anle for edema    Facilitate Muscle  lateral ankle for ligamentous support      Ankle Exercises: Seated   ABC's 2 reps    Towel Crunch --   2 x 1 min   Other Seated Ankle Exercises toe yoga x 10 reps      Ankle Exercises: Supine   T-Band green TB 20x each direction      Ankle Exercises: Stretches   Other Stretch hamstring stretch 30 sec x 3 reps bilat      Ankle Exercises: Standing   Other Standing Ankle Exercises wt shift standing side to side x 10-30 reps                     PT Education - 11/14/21 1552     Education Details HEP    Person(s) Educated Patient    Methods Explanation;Demonstration;Tactile cues;Verbal cues;Handout    Comprehension  Verbalized  understanding;Returned demonstration;Verbal cues required;Tactile cues required                 PT Long Term Goals - 11/14/21 1617       PT LONG TERM GOAL #1   Title Patient to report 75-100% improvement in sensation and absence of pain Lt foot and ankle    Time 6    Period Weeks    Status Achieved    Target Date 01/09/22      PT LONG TERM GOAL #2   Title Increase strength Lt ankle to 4/5 to 5/5    Time 6    Period Weeks    Status Revised    Target Date 01/09/22      PT LONG TERM GOAL #3   Title increase AROM Lt ankle to WFL's and ~ equal to AROM Rt ankle    Time 6    Period Weeks    Status Revised    Target Date 01/09/22      PT LONG TERM GOAL #4   Title Indpendent in HEP (including aquatic program as indicated)    Time 6    Period Weeks    Status Revised    Target Date 01/09/22      PT LONG TERM GOAL #5   Title Improve functional limitation score to 60    Time 6    Period Weeks    Status Revised    Target Date 01/09/22      PT LONG TERM GOAL #6   Title Decresae coccyx pain and increased sitting tolerance by 75% allowing patient to sit for ADL's and functional activities    Time 6    Period Weeks    Status New    Target Date 01/09/22                   Plan - 11/14/21 1550     Clinical Impression Statement Patient reports injust to buttocks with initial fall 10/20/21. She has pain in the coccyx area with sitting. Pain has decreased since time of fall. X-ray shows possible fx at sacrum S5 area with prior fx of coccyx. Patient as limited trunk mobilty/ROM; pain with palpation and pain with sitting. She will benefit from PT to address problems identified and continued treatment to address Lt ankle pathology. Ankle exhibits decreased edema and improved ROM and color.    Rehab Potential Good    PT Frequency 2x / week    PT Duration 6 weeks    PT Treatment/Interventions ADLs/Self Care Home Management;Aquatic Therapy;Cryotherapy;Electrical  Stimulation;Iontophoresis 4mg /ml Dexamethasone;Moist Heat;Ultrasound;Gait training;Stair training;Functional mobility training;Therapeutic activities;Therapeutic exercise;Balance training;Neuromuscular re-education;Manual techniques;Patient/family education;Dry needling;Passive range of motion;Taping;Vasopneumatic Device    PT Next Visit Plan progress HEP, standing ankle strength and balance    PT Home Exercise Plan [facility]    Consulted and Agree with Plan of Care Patient             Patient will benefit from skilled therapeutic intervention in order to improve the following deficits and impairments:     Visit Diagnosis: Acute left ankle pain  Other symptoms and signs involving the musculoskeletal system  Abnormal gait  Muscle weakness (generalized)  Coccyx pain  Abnormal posture     Problem List Patient Active Problem List   Diagnosis Date Noted   Avulsion fracture of left ankle 10/27/2021   Skin lesion 07/05/2021   Insomnia 03/31/2021   Osteoporosis 11/12/2020   Closed fracture of left proximal humerus 09/26/2018   Squamous cell  carcinoma 03/05/2017   Echocardiogram shows left ventricular diastolic dysfunction 96/22/2979   Heart palpitations 07/25/2016   Need for vaccination for pneumococcus 07/13/2016   Osteoarthritis of hand, right 08/06/2015   Diabetes mellitus type 2, uncomplicated ([place]) 89/21/1941   Lung cancer, lower lobe ([place]) 10/09/2014   Hematochezia 04/01/2013   H/O colon cancer, stage II 09/19/2011   Depression with anxiety 09/19/2011   Hyperlipidemia 09/19/2011    [place] Nilda Simmer, PT, MPH  11/14/2021, 4:21 PM  [facility] [place], [place], [address] Phone: [phone]   Fax:  [phone]  Name: Kelli [name] MRN: [id number] Date of Birth: May 03, 1946

## 2021-11-14 NOTE — Patient Instructions (Signed)
Access Code: [facility] URL: [email or web address] Date: 11/14/2021 Prepared by: Gillermo Murdoch  Exercises - Hooklying Hamstring Stretch with Strap  - 2 x daily - 7 x weekly - 1 sets - 3 reps - 30 sec  hold - Ankle Pumps in Elevation  - 2 x daily - 7 x weekly - 1 sets - 10-20 reps - Ankle Circles in Elevation  - 2 x daily - 7 x weekly - 1 sets - 10-15 reps - Ankle Alphabet in Elevation  - 2 x daily - 7 x weekly - 1 sets - 3 reps - Towel Scrunches  - 2 x daily - 7 x weekly - 1 sets - 3 reps - 30 sec  hold - Toe Yoga - Alternating Great Toe and Lesser Toe Extension  - 2 x daily - 7 x weekly - 1 sets - 3 reps - 30 sec  hold - Prone Gluteal Sets  - 2 x daily - 7 x weekly - 1 sets - 10 reps - 10 sec  hold - Standing Weight Shift  - 2 x daily - 7 x weekly - 1 sets - 20-30 reps - 2-3 sec  hold

## 2021-11-17 ENCOUNTER — Encounter: Payer: Self-pay | Admitting: Rehabilitative and Restorative Service Providers"

## 2021-11-17 ENCOUNTER — Ambulatory Visit: Payer: No Typology Code available for payment source | Admitting: Family Medicine

## 2021-11-17 ENCOUNTER — Ambulatory Visit: Payer: No Typology Code available for payment source | Admitting: Rehabilitative and Restorative Service Providers"

## 2021-11-17 DIAGNOSIS — R269 Unspecified abnormalities of gait and mobility: Secondary | ICD-10-CM

## 2021-11-17 DIAGNOSIS — M6281 Muscle weakness (generalized): Secondary | ICD-10-CM

## 2021-11-17 DIAGNOSIS — M25572 Pain in left ankle and joints of left foot: Secondary | ICD-10-CM

## 2021-11-17 DIAGNOSIS — R293 Abnormal posture: Secondary | ICD-10-CM

## 2021-11-17 DIAGNOSIS — R29898 Other symptoms and signs involving the musculoskeletal system: Secondary | ICD-10-CM

## 2021-11-17 DIAGNOSIS — M533 Sacrococcygeal disorders, not elsewhere classified: Secondary | ICD-10-CM

## 2021-11-17 LAB — HM DIABETES EYE EXAM

## 2021-11-17 NOTE — Patient Instructions (Signed)
Access Code: [facility] URL: [email or web address] Date: 11/17/2021 Prepared by: Gillermo Murdoch  Exercises - Hooklying Hamstring Stretch with Strap  - 2 x daily - 7 x weekly - 1 sets - 3 reps - 30 sec  hold - Ankle Pumps in Elevation  - 2 x daily - 7 x weekly - 1 sets - 10-20 reps - Ankle Circles in Elevation  - 2 x daily - 7 x weekly - 1 sets - 10-15 reps - Ankle Alphabet in Elevation  - 2 x daily - 7 x weekly - 1 sets - 3 reps - Towel Scrunches  - 2 x daily - 7 x weekly - 1 sets - 3 reps - 30 sec  hold - Toe Yoga - Alternating Great Toe and Lesser Toe Extension  - 2 x daily - 7 x weekly - 1 sets - 3 reps - 30 sec  hold - Prone Gluteal Sets  - 2 x daily - 7 x weekly - 1 sets - 10 reps - 10 sec  hold - Standing Weight Shift  - 2 x daily - 7 x weekly - 1 sets - 20-30 reps - 2-3 sec  hold - Anti-Rotation Lateral Stepping with Press  - 2 x daily - 7 x weekly - 1-2 sets - 10 reps - 2-3 sec  hold

## 2021-11-17 NOTE — Therapy (Signed)
[place], [place], [address] Phone: [phone]   Fax:  [phone]  Physical Therapy Treatment Rationale for Evaluation and Treatment Rehabilitation  Patient Details  Name: Kelli [name] MRN: [id number] Date of Birth: 1945/06/08 Referring Provider (PT): Dr [name]   Encounter Date: 11/17/2021   PT End of Session - 11/17/21 1202     Visit Number 4    Number of Visits 12    Date for PT Re-Evaluation 12/15/21    PT Start Time 1150    PT Stop Time 1230    PT Time Calculation (min) 40 min    Activity Tolerance Patient tolerated treatment well             Past Medical History:  Diagnosis Date   Anxiety    Arthritis    Borderline diabetic    Colon cancer ([place])    colon ca dx 02/2009, lung cancer   Depression    Depression with anxiety 09/19/2011   H/O colon cancer, stage II 09/19/2011   T3N0  Cecum Sept 2010   High cholesterol    Hyperlipidemia 09/19/2011   Lung nodules 09/19/2011   granulomas   Osteoporosis 2022   Urinary urgency     Past Surgical History:  Procedure Laterality Date   APPENDECTOMY     AUGMENTATION MAMMAPLASTY     BREAST SURGERY  1978   augmentation   COLON SURGERY     2010   CRYO INTERCOSTAL NERVE BLOCK N/A 10/05/2014   Procedure: CRYO INTERCOSTAL NERVE BLOCK;  Surgeon: [name], MD;  Location: [facility];  Service: Thoracic;  Laterality: N/A;   LYMPH NODE DISSECTION Right 10/05/2014   Procedure: LYMPH NODE DISSECTION;  Surgeon: [name], MD;  Location: [facility];  Service: Thoracic;  Laterality: Right;   ORIF HUMERUS FRACTURE Left 10/01/2018   Procedure: OPEN REDUCTION INTERNAL FIXATION (ORIF) LEFT PROXIMAL HUMERUS FRACTURE;  Surgeon: [name], MD;  Location: [facility];  Service: Orthopedics;  Laterality: Left;   PARTIAL COLECTOMY Right 2010   SEGMENTECOMY Right 10/05/2014   Procedure: right lower lobe SEGMENTECTOMY;  Surgeon: [name], MD;  Location: [facility];  Service: Thoracic;  Laterality: Right;   VIDEO ASSISTED THORACOSCOPY (VATS)/WEDGE RESECTION Right 10/05/2014   Procedure: Right VIDEO ASSISTED THORACOSCOPY (VATS) with right lower lobe lung nodule WEDGE RESECTION;  Surgeon: [name], MD;  Location: [facility];  Service: Thoracic;  Laterality: Right;    There were no vitals filed for this visit.   Subjective Assessment - 11/17/21 1203     Subjective Patient reports that her foot and ankle continues to improve but she is still having the swelling in the dorsum of her foot. Working on the butt squeezes.    Currently in Pain? No/denies    Pain Score 0-No pain    Pain Location Ankle    Pain Orientation Left    Pain Descriptors / Indicators Tightness    Pain Score 0    Pain Location Coccyx                               [facility] Adult PT Treatment/Exercise - 11/17/21 0001       Lumbar Exercises: Standing   Other Standing Lumbar Exercises antirotation green TB 10 reps x 2 sets      Knee/Hip Exercises: Standing   Other Standing Knee Exercises wt shift side to  side      Knee/Hip Exercises: Prone   Other Prone Exercises glut set 5 sec x 10 reps (sub max contraction)      Manual Therapy   Manual therapy comments manual work/desentization Lt foot across the dorsum of foot and to lateral ankle    Soft tissue mobilization retrograde massage Lt foot/ankle      Ankle Exercises: Seated   Towel Crunch --   marble pick up; towel scrunch with toes   BAPS Sitting;Level 3;15 reps   CW/CCW   Other Seated Ankle Exercises toe yoga x 10 reps    Other Seated Ankle Exercises rocker board PF/DF x 1 min      Ankle Exercises: Stretches   Other Stretch hamstring stretch 30 sec x 3 reps bilat      Ankle Exercises: Standing   Other Standing Ankle Exercises wt shift standing side to side x 10-30 reps      Ankle Exercises: Supine   T-Band green TB 20x each direction                      PT Education - 11/17/21 1234     Education Details HEP    Person(s) Educated Patient    Methods Explanation;Demonstration;Tactile cues;Verbal cues;Handout    Comprehension Verbalized understanding;Returned demonstration;Verbal cues required;Tactile cues required                 PT Long Term Goals - 11/14/21 1617       PT LONG TERM GOAL #1   Title Patient to report 75-100% improvement in sensation and absence of pain Lt foot and ankle    Time 6    Period Weeks    Status Achieved    Target Date 01/09/22      PT LONG TERM GOAL #2   Title Increase strength Lt ankle to 4/5 to 5/5    Time 6    Period Weeks    Status Revised    Target Date 01/09/22      PT LONG TERM GOAL #3   Title increase AROM Lt ankle to WFL's and ~ equal to AROM Rt ankle    Time 6    Period Weeks    Status Revised    Target Date 01/09/22      PT LONG TERM GOAL #4   Title Indpendent in HEP (including aquatic program as indicated)    Time 6    Period Weeks    Status Revised    Target Date 01/09/22      PT LONG TERM GOAL #5   Title Improve functional limitation score to 60    Time 6    Period Weeks    Status Revised    Target Date 01/09/22      PT LONG TERM GOAL #6   Title Decresae coccyx pain and increased sitting tolerance by 75% allowing patient to sit for ADL's and functional activities    Time 6    Period Weeks    Status New    Target Date 01/09/22                   Plan - 11/17/21 1205     Clinical Impression Statement Continued improvement in the Lt anle pain but patient has continued edema in dorsum of the foot. Patient is working on the glut sets for her coccyx pain/fx. Added antirotation exercise for core stabilization and strengthening.    Rehab Potential Good  PT Frequency 2x / week    PT Duration 6 weeks    PT Treatment/Interventions ADLs/Self Care Home Management;Aquatic Therapy;Cryotherapy;Electrical Stimulation;Iontophoresis 4mg /ml  Dexamethasone;Moist Heat;Ultrasound;Gait training;Stair training;Functional mobility training;Therapeutic activities;Therapeutic exercise;Balance training;Neuromuscular re-education;Manual techniques;Patient/family education;Dry needling;Passive range of motion;Taping;Vasopneumatic Device    PT Next Visit Plan progress HEP, standing ankle strength and balance    PT Home Exercise Plan [facility]    Consulted and Agree with Plan of Care Patient             Patient will benefit from skilled therapeutic intervention in order to improve the following deficits and impairments:     Visit Diagnosis: Acute left ankle pain  Other symptoms and signs involving the musculoskeletal system  Abnormal gait  Muscle weakness (generalized)  Coccyx pain  Abnormal posture     Problem List Patient Active Problem List   Diagnosis Date Noted   Avulsion fracture of left ankle 10/27/2021   Skin lesion 07/05/2021   Insomnia 03/31/2021   Osteoporosis 11/12/2020   Closed fracture of left proximal humerus 09/26/2018   Squamous cell carcinoma 03/05/2017   Echocardiogram shows left ventricular diastolic dysfunction 26/83/4196   Heart palpitations 07/25/2016   Need for vaccination for pneumococcus 07/13/2016   Osteoarthritis of hand, right 08/06/2015   Diabetes mellitus type 2, uncomplicated ([place]) 22/29/7989   Lung cancer, lower lobe ([place]) 10/09/2014   Hematochezia 04/01/2013   H/O colon cancer, stage II 09/19/2011   Depression with anxiety 09/19/2011   Hyperlipidemia 09/19/2011    [place] Nilda Simmer, PT, MPH  11/17/2021, 12:37 PM  [place], [place], [address] Phone: [phone]   Fax:  [phone]  Name: Kelli [name] MRN: [id number] Date of Birth: Jul 02, 1945

## 2021-11-22 ENCOUNTER — Other Ambulatory Visit: Payer: Self-pay | Admitting: Osteopathic Medicine

## 2021-11-22 DIAGNOSIS — F418 Other specified anxiety disorders: Secondary | ICD-10-CM

## 2021-11-24 ENCOUNTER — Encounter: Payer: Self-pay | Admitting: Family Medicine

## 2021-11-28 ENCOUNTER — Ambulatory Visit: Payer: No Typology Code available for payment source | Admitting: Rehabilitative and Restorative Service Providers"

## 2021-11-28 DIAGNOSIS — M6281 Muscle weakness (generalized): Secondary | ICD-10-CM

## 2021-11-28 DIAGNOSIS — M533 Sacrococcygeal disorders, not elsewhere classified: Secondary | ICD-10-CM

## 2021-11-28 DIAGNOSIS — M25572 Pain in left ankle and joints of left foot: Secondary | ICD-10-CM

## 2021-11-28 DIAGNOSIS — R29898 Other symptoms and signs involving the musculoskeletal system: Secondary | ICD-10-CM

## 2021-11-28 DIAGNOSIS — R293 Abnormal posture: Secondary | ICD-10-CM

## 2021-11-28 DIAGNOSIS — R269 Unspecified abnormalities of gait and mobility: Secondary | ICD-10-CM

## 2021-11-28 NOTE — Therapy (Addendum)
[place], [place], [address] Phone: [phone]   Fax:  [phone]  Physical Therapy Treatment  Rationale for Evaluation and Treatment Rehabilitation  PHYSICAL THERAPY DISCHARGE SUMMARY  Visits from Start of Care: 5  Current functional level related to goals / functional outcomes: See progress noted for discharge status    Remaining deficits: No known deficits ankle    Education / Equipment: HEP    Patient agrees to discharge. Patient goals were met. Patient is being discharged due to meeting the stated rehab goals.  Jory Welke P. Helene Kelp PT, MPH 01/18/22 3:05 PM  Patient Details  Name: Kelli [name] MRN: [id number] Date of Birth: Feb 18, 1946 Referring Provider (PT): Dr [name]   Encounter Date: 11/28/2021   PT End of Session - 11/28/21 1322     Visit Number 5    Number of Visits 12    Date for PT Re-Evaluation 12/15/21    PT Start Time 1319    PT Stop Time 1400    PT Time Calculation (min) 41 min    Activity Tolerance Patient tolerated treatment well             Past Medical History:  Diagnosis Date   Anxiety    Arthritis    Borderline diabetic    Colon cancer ([place])    colon ca dx 02/2009, lung cancer   Depression    Depression with anxiety 09/19/2011   H/O colon cancer, stage II 09/19/2011   T3N0  Cecum Sept 2010   High cholesterol    Hyperlipidemia 09/19/2011   Lung nodules 09/19/2011   granulomas   Osteoporosis 2022   Urinary urgency     Past Surgical History:  Procedure Laterality Date   APPENDECTOMY     AUGMENTATION MAMMAPLASTY     BREAST SURGERY  1978   augmentation   COLON SURGERY     2010   CRYO INTERCOSTAL NERVE BLOCK N/A 10/05/2014   Procedure: CRYO INTERCOSTAL NERVE BLOCK;  Surgeon: [name], MD;  Location: [facility];  Service: Thoracic;  Laterality: N/A;   LYMPH NODE DISSECTION Right 10/05/2014   Procedure: LYMPH NODE DISSECTION;  Surgeon:  [name], MD;  Location: [place];  Service: Thoracic;  Laterality: Right;   ORIF HUMERUS FRACTURE Left 10/01/2018   Procedure: OPEN REDUCTION INTERNAL FIXATION (ORIF) LEFT PROXIMAL HUMERUS FRACTURE;  Surgeon: [name], MD;  Location: [facility];  Service: Orthopedics;  Laterality: Left;   PARTIAL COLECTOMY Right 2010   SEGMENTECOMY Right 10/05/2014   Procedure: right lower lobe SEGMENTECTOMY;  Surgeon: [name], MD;  Location: [facility];  Service: Thoracic;  Laterality: Right;   VIDEO ASSISTED THORACOSCOPY (VATS)/WEDGE RESECTION Right 10/05/2014   Procedure: Right VIDEO ASSISTED THORACOSCOPY (VATS) with right lower lobe lung nodule WEDGE RESECTION;  Surgeon: [name], MD;  Location: [facility];  Service: Thoracic;  Laterality: Right;    There were no vitals filed for this visit.   Subjective Assessment - 11/28/21 1323     Subjective Patient reports that her Lt foot is hurting on top from the big toe to the top of the ankle over to the outside. Pain is less frequent - was 2-3 times/day and once a day or every other day. Getting better. No longer having pain in the coccyx.    Currently in Pain? No/denies    Pain Score 0-No pain    Pain Location Ankle    Pain Orientation Left  Pain Descriptors / Indicators Tightness    Pain Type Acute pain    Pain Onset More than a month ago    Pain Frequency Intermittent    Aggravating Factors  walking    Pain Relieving Factors tylenol; motrin    Pain Score 0    Pain Location Coccyx                [facility] PT Assessment - 11/28/21 0001       Assessment   Medical Diagnosis Avulsion fx Lt talus; coccyx pain    Referring Provider (PT) Dr [name]    Onset Date/Surgical Date 10/20/21    Hand Dominance Right    Next MD Visit 11/17/21    Prior Therapy here for shoulders      Observation/Other Assessments   Focus on Therapeutic Outcomes (FOTO)  77      AROM   Overall AROM Comments trunk ROM is WFL's - tight at  end ranges especially in forward flexion    Right Ankle Dorsiflexion 20    Right Ankle Plantar Flexion 58    Right Ankle Inversion 24    Right Ankle Eversion 18    Left Ankle Dorsiflexion 23    Left Ankle Plantar Flexion 50    Left Ankle Inversion 24    Left Ankle Eversion 18      Strength   Overall Strength Comments WFL's Rt LE; Lt hipknee - not tested at ankle      Flexibility   Hamstrings tight bilat      Palpation   Palpation comment tender dorsum of the Lt foot and into dorsum of great toe      Ambulation/Gait   Gait Comments ambulates without AFO Lt ankle no assistive device no notable limp                           [facility] Adult PT Treatment/Exercise - 11/28/21 0001       Lumbar Exercises: Standing   Other Standing Lumbar Exercises antirotation green TB 10 reps x 2 sets      Knee/Hip Exercises: Stretches   Passive Hamstring Stretch Right;Left;2 reps;30 seconds    Passive Hamstring Stretch Limitations supine with strap      Knee/Hip Exercises: Standing   SLS 20-30 sec x 5 each LE    Other Standing Knee Exercises wt shift side to side      Knee/Hip Exercises: Seated   Sit to Sand 10 reps;without UE support      Knee/Hip Exercises: Prone   Other Prone Exercises glut set 5 sec x 10 reps (sub max contraction)      Manual Therapy   Manual therapy comments manual work/desentization Lt foot across the dorsum of foot and to lateral ankle    Joint Mobilization cancneous; talus; metatarsals    Soft tissue mobilization retrograde massage Lt foot/ankle    Passive ROM great toe into flexion and ankle plantar flexion                     PT Education - 11/28/21 1403     Education Details HEP    Person(s) Educated Patient    Methods Explanation;Demonstration;Tactile cues;Verbal cues;Handout    Comprehension Verbalized understanding;Returned demonstration;Verbal cues required;Tactile cues required                 PT Long Term Goals -  11/28/21 1358       PT LONG TERM  GOAL #1   Title Patient to report 75-100% improvement in sensation and absence of pain Lt foot and ankle    Time 6    Period Weeks    Status Achieved    Target Date 01/09/22      PT LONG TERM GOAL #2   Title Increase strength Lt ankle to 4/5 to 5/5    Time 6    Period Weeks    Status Achieved    Target Date 01/09/22      PT LONG TERM GOAL #3   Title increase AROM Lt ankle to WFL's and ~ equal to AROM Rt ankle    Time 6    Period Weeks    Status Achieved    Target Date 01/09/22      PT LONG TERM GOAL #4   Title Indpendent in HEP (including aquatic program as indicated)    Time 6    Period Weeks    Status Achieved    Target Date 01/09/22      PT LONG TERM GOAL #5   Title Improve functional limitation score to 60    Time 6    Period Weeks    Status Achieved    Target Date 01/09/22      PT LONG TERM GOAL #6   Title Decresae coccyx pain and increased sitting tolerance by 75% allowing patient to sit for ADL's and functional activities    Time 6    Period Weeks    Status Achieved    Target Date 01/09/22                   Plan - 11/28/21 1327     Clinical Impression Statement Patient reports good improvement in Lt foot and ankle pain and full resolution of coccyx pain. She is working on exercises at home. Excellent progress overall. Goals of therapy have been accomplished. Patient will continue with independent HEP.    Rehab Potential Good    PT Frequency 2x / week    PT Duration 6 weeks    PT Treatment/Interventions ADLs/Self Care Home Management;Aquatic Therapy;Cryotherapy;Electrical Stimulation;Iontophoresis 44m/ml Dexamethasone;Moist Heat;Ultrasound;Gait training;Stair training;Functional mobility training;Therapeutic activities;Therapeutic exercise;Balance training;Neuromuscular re-education;Manual techniques;Patient/family education;Dry needling;Passive range of motion;Taping;Vasopneumatic Device    PT Next Visit Plan  progress HEP, standing ankle strength and balance    [facility]   Consulted and Agree with Plan of Care Patient             Patient will benefit from skilled therapeutic intervention in order to improve the following deficits and impairments:     Visit Diagnosis: Acute left ankle pain  Other symptoms and signs involving the musculoskeletal system  Abnormal gait  Muscle weakness (generalized)  Coccyx pain  Abnormal posture     Problem List Patient Active Problem List   Diagnosis Date Noted   Avulsion fracture of left ankle 10/27/2021   Skin lesion 07/05/2021   Insomnia 03/31/2021   Osteoporosis 11/12/2020   Closed fracture of left proximal humerus 09/26/2018   Squamous cell carcinoma 03/05/2017   Echocardiogram shows left ventricular diastolic dysfunction 022/07/5425  Heart palpitations 07/25/2016   Need for vaccination for pneumococcus 07/13/2016   Osteoarthritis of hand, right 08/06/2015   Diabetes mellitus type 2, uncomplicated ([place] 006/23/7628  Lung cancer, lower lobe ([place] 10/09/2014   Hematochezia 04/01/2013   H/O colon cancer, stage II 09/19/2011   Depression with anxiety 09/19/2011   Hyperlipidemia 09/19/2011    Cordon Gassett PNilda Simmer  PT, MPH  11/28/2021, 2:10 PM  [facility] [place], [place], [address] Phone: [phone]   Fax:  [phone]  Name: IVANKA KIRSHNER MRN: [id number] Date of Birth: 08/06/1945

## 2021-11-29 ENCOUNTER — Ambulatory Visit (INDEPENDENT_AMBULATORY_CARE_PROVIDER_SITE_OTHER): Payer: No Typology Code available for payment source | Admitting: Family Medicine

## 2021-11-29 VITALS — BP 136/74 | HR 74 | Ht 68.0 in | Wt 175.8 lb

## 2021-11-29 DIAGNOSIS — S322XXD Fracture of coccyx, subsequent encounter for fracture with routine healing: Secondary | ICD-10-CM | POA: Diagnosis not present

## 2021-11-29 DIAGNOSIS — S82892D Other fracture of left lower leg, subsequent encounter for closed fracture with routine healing: Secondary | ICD-10-CM | POA: Diagnosis not present

## 2021-11-29 DIAGNOSIS — S3210XD Unspecified fracture of sacrum, subsequent encounter for fracture with routine healing: Secondary | ICD-10-CM | POA: Diagnosis not present

## 2021-12-17 ENCOUNTER — Other Ambulatory Visit: Payer: Self-pay | Admitting: Family Medicine

## 2021-12-17 DIAGNOSIS — M19041 Primary osteoarthritis, right hand: Secondary | ICD-10-CM

## 2021-12-29 ENCOUNTER — Ambulatory Visit (INDEPENDENT_AMBULATORY_CARE_PROVIDER_SITE_OTHER): Payer: No Typology Code available for payment source | Admitting: Podiatry

## 2021-12-29 ENCOUNTER — Encounter: Payer: Self-pay | Admitting: Podiatry

## 2021-12-29 DIAGNOSIS — L84 Corns and callosities: Secondary | ICD-10-CM | POA: Diagnosis not present

## 2021-12-29 DIAGNOSIS — E119 Type 2 diabetes mellitus without complications: Secondary | ICD-10-CM

## 2021-12-29 NOTE — Progress Notes (Signed)
  Subjective:  Patient ID: Kelli [name], female    DOB: 11-21-1945,   MRN: [id number]  No chief complaint on file.   76 y.o. female presents for concern of a painful corn on the right foot that has been present for many years.  Requesting to have it trimmed. Although it is doing much better. Patient relates she is diabetic and her last A1c was  7.5 . Denies any other pedal complaints. Denies n/v/f/c.   PCP: Luetta Nutting DO Last seen 10/03/21  Past Medical History:  Diagnosis Date   Anxiety    Arthritis    Borderline diabetic    Colon cancer ([place])    colon ca dx 02/2009, lung cancer   Depression    Depression with anxiety 09/19/2011   H/O colon cancer, stage II 09/19/2011   T3N0  Cecum Sept 2010   High cholesterol    Hyperlipidemia 09/19/2011   Lung nodules 09/19/2011   granulomas   Osteoporosis 2022   Urinary urgency     Objective:  Physical Exam: Vascular: DP/PT pulses 2/4 bilateral. CFT <3 seconds. Normal hair growth on digits. No edema.  Skin. No lacerations or abrasions bilateral feet. Hyperkeratotic lesion noted to medial fifth digit on the right.  Musculoskeletal: MMT 5/5 bilateral lower extremities in DF, PF, Inversion and Eversion. Deceased ROM in DF of ankle joint. Adductovarus noted to right fifth digit.  Neurological: Sensation intact to light touch.   Assessment:   1. Corns and callosities   2. Type 2 diabetes mellitus without complication, without long-term current use of insulin ([place])        Plan:  Patient was evaluated and treated and all questions answered. -Discussed corns and calluses with patient and treatment options.  -Hyperkeratotic tissue was debrided with chisel without incident.  -Encouraged daily moisturizing -Discussed use of pumice stone -Advised good supportive shoes and inserts -Discussed and educated patient on diabetic foot care, especially with  regards to the vascular, neurological and musculoskeletal systems.  -Stressed the  importance of good glycemic control and the detriment of not  controlling glucose levels in relation to the foot. -Discussed supportive shoes at all times and checking feet regularly.  -Answered all patient questions -Patient to return in 6 month for diabetic foot check.     Lorenda Peck, DPM

## 2022-01-10 ENCOUNTER — Ambulatory Visit (INDEPENDENT_AMBULATORY_CARE_PROVIDER_SITE_OTHER): Payer: No Typology Code available for payment source

## 2022-01-10 ENCOUNTER — Ambulatory Visit: Payer: Self-pay

## 2022-01-10 ENCOUNTER — Ambulatory Visit (INDEPENDENT_AMBULATORY_CARE_PROVIDER_SITE_OTHER): Payer: No Typology Code available for payment source | Admitting: Family Medicine

## 2022-01-10 VITALS — BP 120/76 | HR 73 | Ht 68.0 in | Wt 174.8 lb

## 2022-01-10 DIAGNOSIS — M25511 Pain in right shoulder: Secondary | ICD-10-CM | POA: Diagnosis not present

## 2022-01-10 DIAGNOSIS — G8929 Other chronic pain: Secondary | ICD-10-CM | POA: Diagnosis not present

## 2022-01-10 DIAGNOSIS — S82892D Other fracture of left lower leg, subsequent encounter for closed fracture with routine healing: Secondary | ICD-10-CM

## 2022-01-10 DIAGNOSIS — M79672 Pain in left foot: Secondary | ICD-10-CM | POA: Diagnosis not present

## 2022-01-10 NOTE — Progress Notes (Signed)
I, Kelli [name], LAT, ATC acting as a scribe for Kelli Leader, MD.  Kelli [name] is a 76 y.o. female who presents to [place] at [facility] today for R shoulder pain. Pt was last seen by Dr. [name] on 11/29/21 for a L ankle avulsion fx. Pt was last seen for her R shoulder on 07/25/21 and was given a R subacromial steroid injection and advised to cont HEP. Today, pt reports R shoulder pain returned in July. Pt tried to hold off on a f/u visit for as long as possible.   Pt c/o cont'd L ankle and foot pain and swelling. Pt notes pain and swelling is better in the morning. Pt reports now pain and swelling across the dorsal forefoot.  This is worse after a long day of standing and better after sleep.  She notes less pain along the dorsal ankle and more dorsal forefoot.  Dx imaging: 07/26/20 R shoulder MRI             11/21/19 R shoulder XR  Pertinent review of systems: No fevers or chills  Relevant historical information: History of lung cancer.   Exam:  BP 120/76   Pulse 73   Ht 5\' 8"  (1.727 m)   Wt 174 lb 12.8 oz (79.3 kg)   LMP  (LMP Unknown)   SpO2 97%   BMI 26.58 kg/m  General: Well Developed, well nourished, and in no acute distress.   MSK: Right shoulder: Normal. Nontender. Pain with abduction.  Normal range of motion. Intact strength. Positive Hawkins and Neer's test.  Left foot and ankle: Slight swelling dorsal forefoot especially along second and third metatarsal shafts. Tender palpation second and third metatarsal shaft. Nontender dorsal ankle. Normal foot and ankle motion. Intact strength.    Lab and Radiology Results  Procedure: Real-time Ultrasound Guided Injection of right shoulder subacromial bursa Device: Philips Affiniti 50G Images permanently stored and available for review in PACS Verbal informed consent obtained.  Discussed risks and benefits of procedure. Warned about infection, bleeding, hyperglycemia damage to structures among  others. Patient expresses understanding and agreement Time-out conducted.   Noted no overlying erythema, induration, or other signs of local infection.   Skin prepped in a sterile fashion.   Local anesthesia: Topical Ethyl chloride.   With sterile technique and under real time ultrasound guidance: 40 mg of Kenalog and 2 mL of Marcaine injected into subacromial bursa. Fluid seen entering the bursa.   Completed without difficulty   Pain immediately resolved suggesting accurate placement of the medication.   Advised to call if fevers/chills, erythema, induration, drainage, or persistent bleeding.   Images permanently stored and available for review in the ultrasound unit.  Impression: Technically successful ultrasound guided injection.    X-ray images right foot obtained today personally and independently interpreted Avulsion fragment dorsal talus is still present although less prominent. No fracture or stress fracture change visible at metatarsal shafts. Await formal radiology review     Assessment and Plan: 76 y.o. female with right shoulder pain.  This is an acute exacerbation of a chronic problem.  On prior MRI she has a partial rotator cuff tear supraspinatus tendon which is probably the main source of her pain today.  She last had steroid injection February of this year.  Plan for repeat injection and continued home exercise program.  Recheck back as needed.  Left foot pain.  She previously was identified with a small dorsal avulsion fracture of the left ankle.  Pain  today is much more located along the dorsal forefoot along metatarsal shafts.  This is concerning for stress fracture.  Plan for x-ray today.  Relative rest and consider postop shoe if needed.  Consider MRI in the future if needed.   PDMP not reviewed this encounter. Orders Placed This Encounter  Procedures   [place] LIMITED JOINT SPACE STRUCTURES UP RIGHT(NO LINKED CHARGES)    Order Specific Question:   Reason for Exam  (SYMPTOM  OR DIAGNOSIS REQUIRED)    Answer:   right shoulder pain    Order Specific Question:   Preferred imaging location?    Answer:   [facility]   DG Foot Complete Left    Standing Status:   Future    Number of Occurrences:   1    Standing Expiration Date:   01/11/2023    Order Specific Question:   Reason for Exam (SYMPTOM  OR DIAGNOSIS REQUIRED)    Answer:   left foot pain    Order Specific Question:   Preferred imaging location?    Answer:   [facility]   No orders of the defined types were placed in this encounter.    Discussed warning signs or symptoms. Please see discharge instructions. Patient expresses understanding.   The above documentation has been reviewed and is accurate and complete Kelli [name], M.D.

## 2022-01-10 NOTE — Patient Instructions (Addendum)
Thank you for coming in today.   You received an injection today. Seek immediate medical attention if the joint becomes red, extremely painful, or is oozing fluid.   Please get an Xray today before you leave   If your foot hurts bad you can use a post op shoe.   Recheck as needed.

## 2022-01-11 NOTE — Progress Notes (Signed)
Left foot x-ray shows that that small avulsion fracture is stable.  The forefoot where you hurt has a little bit of arthritis in the big toe but otherwise looks okay to radiology.

## 2022-01-19 ENCOUNTER — Ambulatory Visit (HOSPITAL_BASED_OUTPATIENT_CLINIC_OR_DEPARTMENT_OTHER)
Admission: RE | Admit: 2022-01-19 | Discharge: 2022-01-19 | Disposition: A | Discharge status: Home / Self Care | Payer: No Typology Code available for payment source | Source: Ambulatory Visit | Attending: Nurse Practitioner | Admitting: Nurse Practitioner

## 2022-01-19 DIAGNOSIS — C3431 Malignant neoplasm of lower lobe, right bronchus or lung: Secondary | ICD-10-CM | POA: Insufficient documentation

## 2022-01-19 DIAGNOSIS — J439 Emphysema, unspecified: Secondary | ICD-10-CM | POA: Diagnosis not present

## 2022-01-19 DIAGNOSIS — R918 Other nonspecific abnormal finding of lung field: Secondary | ICD-10-CM | POA: Diagnosis not present

## 2022-01-21 ENCOUNTER — Other Ambulatory Visit: Payer: Self-pay | Admitting: Osteopathic Medicine

## 2022-01-24 ENCOUNTER — Inpatient Hospital Stay: Payer: No Typology Code available for payment source | Attending: Oncology | Admitting: Oncology

## 2022-01-24 VITALS — BP 130/58 | HR 73 | Temp 98.1°F | Resp 20 | Ht 68.0 in | Wt 174.8 lb

## 2022-01-24 DIAGNOSIS — C3431 Malignant neoplasm of lower lobe, right bronchus or lung: Secondary | ICD-10-CM | POA: Diagnosis not present

## 2022-01-24 DIAGNOSIS — Z87891 Personal history of nicotine dependence: Secondary | ICD-10-CM | POA: Insufficient documentation

## 2022-01-24 DIAGNOSIS — Z85038 Personal history of other malignant neoplasm of large intestine: Secondary | ICD-10-CM | POA: Insufficient documentation

## 2022-01-24 DIAGNOSIS — Z9221 Personal history of antineoplastic chemotherapy: Secondary | ICD-10-CM | POA: Diagnosis not present

## 2022-01-24 DIAGNOSIS — Z85110 Personal history of malignant carcinoid tumor of bronchus and lung: Secondary | ICD-10-CM | POA: Diagnosis not present

## 2022-01-24 NOTE — Progress Notes (Signed)
[place] OFFICE PROGRESS NOTE   Diagnosis: Non-small cell lung cancer, colon cancer  INTERVAL HISTORY:   Kelli [name] returns as scheduled.  She feels well.  She reports intentional weight loss with a change in her diet.  No new complaint.  Objective:  Vital signs in last 24 hours:  Blood pressure (!) 130/58, pulse 73, temperature 98.1 F (36.7 C), temperature source Oral, resp. rate 20, height _0  (1.727 m), weight 174 lb 12.8 oz (79.3 kg), SpO2 98 %.    Lymphatics: No cervical, supraclavicular, axillary, or inguinal nodes Resp: Decreased breath sounds with coarse inspiratory rhonchi at the right posterior base, no respiratory distress Cardio: Regular rate and rhythm GI: No hepatosplenomegaly, no mass, nontender Vascular: No leg edema   Lab Results:  Lab Results  Component Value Date   WBC 7.6 07/21/2021   HGB 12.9 07/21/2021   HCT 39.3 07/21/2021   MCV 92.0 07/21/2021   PLT 298 07/21/2021   NEUTROABS 4,621 07/21/2021    CMP  Lab Results  Component Value Date   NA 140 07/21/2021   K 4.3 07/21/2021   CL 105 07/21/2021   CO2 28 07/21/2021   GLUCOSE 139 (H) 07/21/2021   BUN 16 07/21/2021   CREATININE 0.70 07/21/2021   CALCIUM 9.0 07/21/2021   PROT 6.7 07/21/2021   ALBUMIN 2.1 (L) 09/20/2020   AST 17 07/21/2021   ALT 18 07/21/2021   ALKPHOS 100 09/20/2020   BILITOT 0.4 07/21/2021   GFRNONAA 75 11/08/2020   GFRAA 87 11/08/2020    Lab Results  Component Value Date   CEA 1.8 08/28/2014    Medications: I have reviewed the patient's current medications.   Assessment/Plan: Stage II (T3 N0) adenocarcinoma the cecum diagnosed in September 2010, status post adjuvant Xeloda MSI-high, BRAF mutation detected 2. History of colonic polyps-status post a surveillance colonoscopy in February 2016 with a tubular adenoma removed Colonoscopy 01/30/2020-polyps removed from the rectum and descending colon, hyperplastic polyps 3. History of heavy tobacco  use, quit in 2010 4. History of lung nodules-last imaging was an abdomen CT in October 2013 prior to repeat imaging 09/03/2014 CT 09/03/2014 revealed enlargement of a spiculated right lower lobe nodule PET scan 09/16/2014 revealed low-level FDG activity associated with the right lower lobe spiculated nodule Status post a right lower lobe basilar segmentectomy and mediastinal lymph node dissection on 10/06/2014 with the pathology confirming a minimally invasive well-differentiated adenocarcinoma,pT1a,N0, invasive component measured less than 0.3 cm, with negative surgical margins CT chest 09/15/2015 with no evidence of recurrent lung cancer, stable nodules except for a probable new 4 mm right lower lobe subpleural nodule CT chest 06/19/2016-no evidence of recurrent lung cancer, stable lung nodules CT chest 03/20/2017-stable lung nodules CT chest 03/26/2018- stable lung nodules CT chest 04/09/2019-stable lung nodules CT chest 04/10/2019-stable lung nodules CT chest 10/25/2020-stable lung nodules.  Resolution of multifocal pulmonary infiltrate and improvement in bronchial wall thickening. CT chest 01/18/2021-stable small solid nodules and groundglass opacities. CT chest 07/23/2021-subpleural focal area of groundglass attenuation within the anterior left upper lobe mildly increased when compared to remote priors, stable when compared to more recent priors.  Additional multiple bilateral pulmonary nodules grossly stable when compared to chest CT exams dating back to 2018. CT chest 01/19/2022-stable upper lobe groundglass opacity, multiple stable bilateral pulmonary nodules   5.  Post thoracotomy pain-resolved   6.  Left humerus fracture following a fall, status post surgical repair 10/01/2018      Disposition: Kelli [name] remains in  clinical remission from colon cancer and lung cancer.  The chest CT 01/19/2022 revealed no evidence of a progressive malignancy.  She will return for an office visit and  surveillance chest CT in 9 months.  Betsy Coder, MD  01/24/2022  12:51 PM

## 2022-01-30 DIAGNOSIS — H524 Presbyopia: Secondary | ICD-10-CM | POA: Diagnosis not present

## 2022-01-30 DIAGNOSIS — H5213 Myopia, bilateral: Secondary | ICD-10-CM | POA: Diagnosis not present

## 2022-01-30 DIAGNOSIS — H2513 Age-related nuclear cataract, bilateral: Secondary | ICD-10-CM | POA: Diagnosis not present

## 2022-01-30 DIAGNOSIS — E119 Type 2 diabetes mellitus without complications: Secondary | ICD-10-CM | POA: Diagnosis not present

## 2022-01-30 DIAGNOSIS — Z7984 Long term (current) use of oral hypoglycemic drugs: Secondary | ICD-10-CM | POA: Diagnosis not present

## 2022-02-02 ENCOUNTER — Other Ambulatory Visit: Payer: Self-pay | Admitting: Family Medicine

## 2022-02-02 DIAGNOSIS — N6320 Unspecified lump in the left breast, unspecified quadrant: Secondary | ICD-10-CM

## 2022-02-07 ENCOUNTER — Ambulatory Visit: Payer: No Typology Code available for payment source | Admitting: Family Medicine

## 2022-02-16 ENCOUNTER — Ambulatory Visit (INDEPENDENT_AMBULATORY_CARE_PROVIDER_SITE_OTHER): Payer: No Typology Code available for payment source | Admitting: Family Medicine

## 2022-02-16 ENCOUNTER — Encounter: Payer: Self-pay | Admitting: Family Medicine

## 2022-02-16 VITALS — BP 116/70 | HR 72 | Ht 68.0 in | Wt 176.1 lb

## 2022-02-16 DIAGNOSIS — Z23 Encounter for immunization: Secondary | ICD-10-CM | POA: Diagnosis not present

## 2022-02-16 DIAGNOSIS — R252 Cramp and spasm: Secondary | ICD-10-CM

## 2022-02-16 DIAGNOSIS — E119 Type 2 diabetes mellitus without complications: Secondary | ICD-10-CM | POA: Diagnosis not present

## 2022-02-16 DIAGNOSIS — E785 Hyperlipidemia, unspecified: Secondary | ICD-10-CM

## 2022-02-16 DIAGNOSIS — F418 Other specified anxiety disorders: Secondary | ICD-10-CM | POA: Diagnosis not present

## 2022-02-16 DIAGNOSIS — Z794 Long term (current) use of insulin: Secondary | ICD-10-CM | POA: Diagnosis not present

## 2022-02-16 LAB — POCT UA - MICROALBUMIN
Albumin/Creatinine Ratio, Urine, POC: <30
Creatinine, POC: 300 mg/dL
Microalbumin Ur, POC: 30 mg/L

## 2022-02-16 LAB — POCT GLYCOSYLATED HEMOGLOBIN (HGB A1C): HbA1c, POC (controlled diabetic range): 6.8 % (ref 0.0–7.0)

## 2022-02-16 NOTE — Assessment & Plan Note (Signed)
Lab Results  Component Value Date   HGBA1C 6.8 02/16/2022  Blood sugars are fairly well controlled.  Continue Basaglar at current strength.  Encouraged continued dietary change.

## 2022-02-16 NOTE — Assessment & Plan Note (Signed)
Continues to do well with fluoxetine at current strength, continue.

## 2022-02-16 NOTE — Assessment & Plan Note (Signed)
Doing well with pravastatin at current strength.  We will plan to continue.

## 2022-02-16 NOTE — Patient Instructions (Signed)
We'll be in touch with lab results.

## 2022-02-16 NOTE — Progress Notes (Signed)
Kelli [name] - 76 y.o. female MRN [id number]  Date of birth: 28-Jan-1946  Subjective Chief Complaint  Patient presents with   Diabetes    HPI Kelli [name] is a 76 y.o. female here today for follow up visit.   Since her last visit she had an ankle fracture of her L ankle.  She has been seeing Dr. [name] for this.  Also with some coccygeal pain and has been seeing PT for this.  This is much better at this time.  Continues on basaglar for management of diabetes.  She is doing well and denies symptoms of hypoglycemia.  She has been making some changes to her diet.  Blood sugars at home have been well controlled.  Blood pressure mains stable with current medications.  She has not had any chest pain, shortness of breath, palpitations, headaches or vision changes.  She reports she has had some cramps in her lower legs at night.   ROS:  A comprehensive ROS was completed and negative except as noted per HPI    No Known Allergies  Past Medical History:  Diagnosis Date   Anxiety    Arthritis    Borderline diabetic    Colon cancer ([place])    colon ca dx 02/2009, lung cancer   Depression    Depression with anxiety 09/19/2011   H/O colon cancer, stage II 09/19/2011   T3N0  [place] Sept 2010   High cholesterol    Hyperlipidemia 09/19/2011   Lung nodules 09/19/2011   granulomas   Osteoporosis 2022   Urinary urgency     Past Surgical History:  Procedure Laterality Date   APPENDECTOMY     AUGMENTATION MAMMAPLASTY     BREAST SURGERY  1978   augmentation   COLON SURGERY     2010   CRYO INTERCOSTAL NERVE BLOCK N/A 10/05/2014   Procedure: CRYO INTERCOSTAL NERVE BLOCK;  Surgeon: [name], MD;  Location: [place];  Service: Thoracic;  Laterality: N/A;   LYMPH NODE DISSECTION Right 10/05/2014   Procedure: LYMPH NODE DISSECTION;  Surgeon: [name], MD;  Location: [place];  Service: Thoracic;  Laterality: Right;   ORIF HUMERUS FRACTURE Left 10/01/2018   Procedure: OPEN  REDUCTION INTERNAL FIXATION (ORIF) LEFT PROXIMAL HUMERUS FRACTURE;  Surgeon: [name], MD;  Location: [place];  Service: Orthopedics;  Laterality: Left;   PARTIAL COLECTOMY Right 2010   SEGMENTECOMY Right 10/05/2014   Procedure: right lower lobe SEGMENTECTOMY;  Surgeon: [name], MD;  Location: [facility] OR;  Service: Thoracic;  Laterality: Right;   VIDEO ASSISTED THORACOSCOPY (VATS)/WEDGE RESECTION Right 10/05/2014   Procedure: Right VIDEO ASSISTED THORACOSCOPY (VATS) with right lower lobe lung nodule WEDGE RESECTION;  Surgeon: [name], MD;  Location: [facility] OR;  Service: Thoracic;  Laterality: Right;    Social History   Socioeconomic History   Marital status: Divorced    Spouse name: Not on file   Number of children: 0   Years of education: 16   Highest education level: Some college, no degree  Occupational History    Comment: Retired.  Tobacco Use   Smoking status: Former    Packs/day: 3.00    Years: 40.00    Total pack years: 120.00    Types: Cigarettes    Start date: 39    Quit date: 02/06/2009    Years since quitting: 13.0   Smokeless tobacco: Never  Vaping Use   Vaping Use: Never used  Substance and Sexual Activity  Alcohol use: Not Currently    Comment: 1-2 beers every other week   Drug use: No   Sexual activity: Not Currently    Birth control/protection: Post-menopausal  Other Topics Concern   Not on file  Social History Narrative   Lives alone. She has been helping take care of her sister, who has Alzheimer's disease.   Social Determinants of Health   Financial Resource Strain: Low Risk  (10/28/2021)   Overall Financial Resource Strain (CARDIA)    Difficulty of Paying Living Expenses: Not hard at all  Food Insecurity: No Food Insecurity (10/28/2021)   Hunger Vital Sign    Worried About Running Out of Food in the Last Year: Never true    Ran Out of Food in the Last Year: Never true  Transportation Needs: No Transportation Needs  (10/28/2021)   PRAPARE - Hydrologist (Medical): No    Lack of Transportation (Non-Medical): No  Physical Activity: Insufficiently Active (10/28/2021)   Exercise Vital Sign    Days of Exercise per Week: 2 days    Minutes of Exercise per Session: 10 min  Stress: No Stress Concern Present (10/28/2021)   [place]    Feeling of Stress : Not at all  Social Connections: Moderately Isolated (11/01/2021)   Social Connection and Isolation Panel [NHANES]    Frequency of Communication with Friends and Family: Three times a week    Frequency of Social Gatherings with Friends and Family: Twice a week    Attends Religious Services: 1 to 4 times per year    Active Member of Clubs or Organizations: No    Attends Archivist Meetings: Never    Marital Status: Divorced    Family History  Problem Relation Age of Onset   Heart disease Mother    Colon polyps Sister    Heart disease Brother    Cancer Brother    Colon cancer Neg Hx    Esophageal cancer Neg Hx    Stomach cancer Neg Hx    Rectal cancer Neg Hx     Health Maintenance  Topic Date Due   COVID-19 Vaccine (5 - Pfizer risk series) 07/06/2022 (Originally 03/17/2021)   Zoster Vaccines- Shingrix (1 of 2) 07/06/2022 (Originally 01/21/1967)   FOOT EXAM  06/10/2022   Diabetic kidney evaluation - GFR measurement  07/21/2022   HEMOGLOBIN A1C  08/17/2022   DEXA SCAN  11/11/2022   OPHTHALMOLOGY EXAM  11/18/2022   MAMMOGRAM  12/30/2022   Diabetic kidney evaluation - Urine ACR  02/17/2023   COLONOSCOPY (Pts 45-72yrs Insurance coverage will need to be confirmed)  01/29/2025   TETANUS/TDAP  10/01/2028   Pneumonia Vaccine 105+ Years old  Completed   INFLUENZA VACCINE  Completed   Hepatitis C Screening  Completed   HPV VACCINES  Aged Out      ----------------------------------------------------------------------------------------------------------------------------------------------------------------------------------------------------------------- Physical Exam BP 116/70 (BP Location: Left Arm, Patient Position: Sitting, Cuff Size: Normal)   Pulse 72   Ht 5\' 8"  (1.727 m)   Wt 176 lb 1.3 oz (79.9 kg)   LMP  (LMP Unknown)   SpO2 98%   BMI 26.77 kg/m   Physical Exam Constitutional:      Appearance: Normal appearance.  Eyes:     General: No scleral icterus. Cardiovascular:     Rate and Rhythm: Normal rate and regular rhythm.  Pulmonary:     Effort: Pulmonary effort is normal.     Breath sounds:  Normal breath sounds.  Musculoskeletal:     Cervical back: Neck supple.  Neurological:     Mental Status: She is alert.  Psychiatric:        Mood and Affect: Mood normal.        Behavior: Behavior normal.     ------------------------------------------------------------------------------------------------------------------------------------------------------------------------------------------------------------------- Assessment and Plan  Diabetes mellitus type 2, uncomplicated ([place]) Lab Results  Component Value Date   HGBA1C 6.8 02/16/2022  Blood sugars are fairly well controlled.  Continue Basaglar at current strength.  Encouraged continued dietary change.  Depression with anxiety Continues to do well with fluoxetine at current strength, continue.  Hyperlipidemia Doing well with pravastatin at current strength.  We will plan to continue.   No orders of the defined types were placed in this encounter.   Return in about 3 months (around 05/18/2022) for HTN/T2DM.    This visit occurred during the SARS-CoV-2 public health emergency.  Safety protocols were in place, including screening questions prior to the visit, additional usage of staff PPE, and extensive cleaning of exam room while observing appropriate  contact time as indicated for disinfecting solutions.

## 2022-02-17 ENCOUNTER — Ambulatory Visit
Admission: RE | Admit: 2022-02-17 | Discharge: 2022-02-17 | Disposition: A | Discharge status: Home / Self Care | Payer: No Typology Code available for payment source | Source: Ambulatory Visit | Attending: Family Medicine | Admitting: Family Medicine

## 2022-02-17 ENCOUNTER — Other Ambulatory Visit: Payer: Self-pay | Admitting: Family Medicine

## 2022-02-17 DIAGNOSIS — N6320 Unspecified lump in the left breast, unspecified quadrant: Secondary | ICD-10-CM

## 2022-02-17 DIAGNOSIS — R921 Mammographic calcification found on diagnostic imaging of breast: Secondary | ICD-10-CM

## 2022-02-17 DIAGNOSIS — R928 Other abnormal and inconclusive findings on diagnostic imaging of breast: Secondary | ICD-10-CM

## 2022-02-17 DIAGNOSIS — N6311 Unspecified lump in the right breast, upper outer quadrant: Secondary | ICD-10-CM | POA: Diagnosis not present

## 2022-02-17 DIAGNOSIS — N6321 Unspecified lump in the left breast, upper outer quadrant: Secondary | ICD-10-CM | POA: Diagnosis not present

## 2022-02-17 DIAGNOSIS — N6322 Unspecified lump in the left breast, upper inner quadrant: Secondary | ICD-10-CM | POA: Diagnosis not present

## 2022-02-17 DIAGNOSIS — R922 Inconclusive mammogram: Secondary | ICD-10-CM | POA: Diagnosis not present

## 2022-02-17 DIAGNOSIS — N6312 Unspecified lump in the right breast, upper inner quadrant: Secondary | ICD-10-CM | POA: Diagnosis not present

## 2022-02-17 LAB — BASIC METABOLIC PANEL
BUN: 15 mg/dL (ref 7–25)
CO2: 29 mmol/L (ref 20–32)
Calcium: 9.5 mg/dL (ref 8.6–10.4)
Chloride: 105 mmol/L (ref 98–110)
Creat: 0.7 mg/dL (ref 0.60–1.00)
Glucose, Bld: 115 mg/dL — ABNORMAL HIGH (ref 65–99)
Potassium: 4.4 mmol/L (ref 3.5–5.3)
Sodium: 141 mmol/L (ref 135–146)

## 2022-02-17 LAB — MAGNESIUM: Magnesium: 2 mg/dL (ref 1.5–2.5)

## 2022-02-27 ENCOUNTER — Ambulatory Visit
Admission: RE | Admit: 2022-02-27 | Discharge: 2022-02-27 | Disposition: A | Discharge status: Home / Self Care | Payer: No Typology Code available for payment source | Source: Ambulatory Visit | Attending: Family Medicine | Admitting: Family Medicine

## 2022-02-27 DIAGNOSIS — R921 Mammographic calcification found on diagnostic imaging of breast: Secondary | ICD-10-CM

## 2022-02-27 DIAGNOSIS — N6311 Unspecified lump in the right breast, upper outer quadrant: Secondary | ICD-10-CM | POA: Diagnosis not present

## 2022-02-27 DIAGNOSIS — D0511 Intraductal carcinoma in situ of right breast: Secondary | ICD-10-CM | POA: Diagnosis not present

## 2022-02-27 HISTORY — PX: BREAST BIOPSY: SHX20

## 2022-02-28 DIAGNOSIS — H2513 Age-related nuclear cataract, bilateral: Secondary | ICD-10-CM | POA: Diagnosis not present

## 2022-02-28 DIAGNOSIS — H353131 Nonexudative age-related macular degeneration, bilateral, early dry stage: Secondary | ICD-10-CM | POA: Diagnosis not present

## 2022-03-01 ENCOUNTER — Other Ambulatory Visit: Payer: Self-pay | Admitting: *Deleted

## 2022-03-01 DIAGNOSIS — C50111 Malignant neoplasm of central portion of right female breast: Secondary | ICD-10-CM

## 2022-03-01 NOTE — Progress Notes (Signed)
PATIENT NAVIGATOR PROGRESS NOTE  Name: Kelli [name] Date: 03/01/2022 MRN: [id number]  DOB: 10-03-1945   Reason for visit:  New diagnosis for Breast cancer and in need of referral to surgeon  Comments:  Referral placed with Dr [name]     Time spent counseling/coordinating care: 30-45 minutes

## 2022-03-06 ENCOUNTER — Other Ambulatory Visit: Payer: Self-pay | Admitting: General Surgery

## 2022-03-06 DIAGNOSIS — C50411 Malignant neoplasm of upper-outer quadrant of right female breast: Secondary | ICD-10-CM

## 2022-03-06 DIAGNOSIS — Z170 Estrogen receptor positive status [ER+]: Secondary | ICD-10-CM | POA: Diagnosis not present

## 2022-03-06 DIAGNOSIS — Z85038 Personal history of other malignant neoplasm of large intestine: Secondary | ICD-10-CM | POA: Diagnosis not present

## 2022-03-07 ENCOUNTER — Other Ambulatory Visit: Payer: Self-pay | Admitting: General Surgery

## 2022-03-07 DIAGNOSIS — Z6826 Body mass index (BMI) 26.0-26.9, adult: Secondary | ICD-10-CM | POA: Diagnosis not present

## 2022-03-07 DIAGNOSIS — E663 Overweight: Secondary | ICD-10-CM | POA: Diagnosis not present

## 2022-03-07 DIAGNOSIS — G4700 Insomnia, unspecified: Secondary | ICD-10-CM | POA: Diagnosis not present

## 2022-03-07 DIAGNOSIS — Z008 Encounter for other general examination: Secondary | ICD-10-CM | POA: Diagnosis not present

## 2022-03-07 DIAGNOSIS — I700 Atherosclerosis of aorta: Secondary | ICD-10-CM | POA: Diagnosis not present

## 2022-03-07 DIAGNOSIS — Z794 Long term (current) use of insulin: Secondary | ICD-10-CM | POA: Diagnosis not present

## 2022-03-07 DIAGNOSIS — C50411 Malignant neoplasm of upper-outer quadrant of right female breast: Secondary | ICD-10-CM

## 2022-03-07 DIAGNOSIS — E1169 Type 2 diabetes mellitus with other specified complication: Secondary | ICD-10-CM | POA: Diagnosis not present

## 2022-03-07 DIAGNOSIS — F17211 Nicotine dependence, cigarettes, in remission: Secondary | ICD-10-CM | POA: Diagnosis not present

## 2022-03-07 DIAGNOSIS — E785 Hyperlipidemia, unspecified: Secondary | ICD-10-CM | POA: Diagnosis not present

## 2022-03-07 DIAGNOSIS — F419 Anxiety disorder, unspecified: Secondary | ICD-10-CM | POA: Diagnosis not present

## 2022-03-08 ENCOUNTER — Encounter: Payer: Self-pay | Admitting: *Deleted

## 2022-03-08 NOTE — Progress Notes (Signed)
PATIENT NAVIGATOR PROGRESS NOTE  Name: Kelli [name] Date: 03/08/2022 MRN: [id number]  DOB: Oct 26, 1945   Reason for visit:  Introductory call   Comments:  Discussed upcoming surgery on 10/18 and is now scheduled with Dr Benay Spice on 11/1 for New patient appt Verbalized understanding    Time spent counseling/coordinating care: 30-45 minutes

## 2022-03-10 ENCOUNTER — Telehealth: Payer: Self-pay | Admitting: Licensed Clinical Social Worker

## 2022-03-10 NOTE — Telephone Encounter (Signed)
Scheduled appt per 10/6 referral. Pt is aware of appt date and time. Pt is aware to arrive 15 mins prior to appt time and to bring and updated insurance card. Pt is aware of appt location.

## 2022-03-13 ENCOUNTER — Encounter (HOSPITAL_BASED_OUTPATIENT_CLINIC_OR_DEPARTMENT_OTHER): Payer: Self-pay | Admitting: General Surgery

## 2022-03-13 ENCOUNTER — Other Ambulatory Visit: Payer: Self-pay

## 2022-03-14 ENCOUNTER — Other Ambulatory Visit (HOSPITAL_BASED_OUTPATIENT_CLINIC_OR_DEPARTMENT_OTHER): Payer: No Typology Code available for payment source

## 2022-03-14 ENCOUNTER — Encounter (HOSPITAL_BASED_OUTPATIENT_CLINIC_OR_DEPARTMENT_OTHER)
Admission: RE | Admit: 2022-03-14 | Discharge: 2022-03-14 | Disposition: A | Discharge status: Home / Self Care | Payer: No Typology Code available for payment source | Source: Ambulatory Visit | Attending: General Surgery | Admitting: General Surgery

## 2022-03-14 DIAGNOSIS — Z01812 Encounter for preprocedural laboratory examination: Secondary | ICD-10-CM | POA: Insufficient documentation

## 2022-03-14 LAB — BASIC METABOLIC PANEL
Anion gap: 10 (ref 5–15)
BUN: 13 mg/dL (ref 8–23)
CO2: 26 mmol/L (ref 22–32)
Calcium: 9.6 mg/dL (ref 8.9–10.3)
Chloride: 103 mmol/L (ref 98–111)
Creatinine, Ser: 0.81 mg/dL (ref 0.44–1.00)
GFR, Estimated: >60 mL/min (ref >60)
Glucose, Bld: 176 mg/dL — ABNORMAL HIGH (ref 70–99)
Potassium: 4.3 mmol/L (ref 3.5–5.1)
Sodium: 139 mmol/L (ref 135–145)

## 2022-03-14 MED ORDER — CHLORHEXIDINE GLUCONATE CLOTH 2 % EX PADS: 6.0000 | MEDICATED_PAD | Freq: Once | CUTANEOUS | Status: DC

## 2022-03-14 NOTE — Progress Notes (Signed)
       Patient Instructions  The night before surgery:  No food after midnight. ONLY clear liquids after midnight  The day of surgery (if you do NOT have diabetes):  Drink ONE (1) Pre-Surgery Clear Ensure as directed.   This drink was given to you during your hospital  pre-op appointment visit. The pre-op nurse will instruct you on the time to drink the  Pre-Surgery Ensure depending on your surgery time. Finish the drink at the designated time by the pre-op nurse.  Nothing else to drink after completing the  Pre-Surgery Clear Ensure.  The day of surgery (if you have diabetes): Drink ONE (1) Gatorade 2 (G2) as directed. This drink was given to you during your hospital  pre-op appointment visit.  The pre-op nurse will instruct you on the time to drink the   Gatorade 2 (G2) depending on your surgery time. Color of the Gatorade may vary. Red is not allowed. Nothing else to drink after completing the  Gatorade 2 (G2).         If you have questions, please contact your surgeon's office.       Patient Instructions  The night before surgery:  No food after midnight. ONLY clear liquids after midnight  The day of surgery (if you do NOT have diabetes):  Drink ONE (1) Pre-Surgery Clear Ensure as directed.   This drink was given to you during your hospital  pre-op appointment visit. The pre-op nurse will instruct you on the time to drink the  Pre-Surgery Ensure depending on your surgery time. Finish the drink at the designated time by the pre-op nurse.  Nothing else to drink after completing the  Pre-Surgery Clear Ensure.  The day of surgery (if you have diabetes): Drink ONE (1) Gatorade 2 (G2) as directed. This drink was given to you during your hospital  pre-op appointment visit.  The pre-op nurse will instruct you on the time to drink the   Gatorade 2 (G2) depending on your surgery time. Color of the Gatorade may vary. Red is not allowed. Nothing else to drink after  completing the  Gatorade 2 (G2).         If you have questions, please contact your surgeon's office.      Patient Instructions  The night before surgery:  No food after midnight. ONLY clear liquids after midnight  The day of surgery (if you do NOT have diabetes):  Drink ONE (1) Pre-Surgery Clear Ensure as directed.   This drink was given to you during your hospital  pre-op appointment visit. The pre-op nurse will instruct you on the time to drink the  Pre-Surgery Ensure depending on your surgery time. Finish the drink at the designated time by the pre-op nurse.  Nothing else to drink after completing the  Pre-Surgery Clear Ensure.  The day of surgery (if you have diabetes): Drink ONE (1) Gatorade 2 (G2) as directed. This drink was given to you during your hospital  pre-op appointment visit.  The pre-op nurse will instruct you on the time to drink the   Gatorade 2 (G2) depending on your surgery time. Color of the Gatorade may vary. Red is not allowed. Nothing else to drink after completing the  Gatorade 2 (G2).         If you have questions, please contact your surgeon's office.  Surgical soap given to patient, instructions given, patient verbalized understanding.

## 2022-03-17 ENCOUNTER — Other Ambulatory Visit: Payer: Self-pay | Admitting: Family Medicine

## 2022-03-17 DIAGNOSIS — M19041 Primary osteoarthritis, right hand: Secondary | ICD-10-CM

## 2022-03-21 ENCOUNTER — Ambulatory Visit
Admission: RE | Admit: 2022-03-21 | Discharge: 2022-03-21 | Disposition: A | Discharge status: Home / Self Care | Payer: No Typology Code available for payment source | Source: Ambulatory Visit | Attending: General Surgery | Admitting: General Surgery

## 2022-03-21 DIAGNOSIS — D0512 Intraductal carcinoma in situ of left breast: Secondary | ICD-10-CM | POA: Diagnosis not present

## 2022-03-21 DIAGNOSIS — C50411 Malignant neoplasm of upper-outer quadrant of right female breast: Secondary | ICD-10-CM

## 2022-03-22 ENCOUNTER — Ambulatory Visit (HOSPITAL_BASED_OUTPATIENT_CLINIC_OR_DEPARTMENT_OTHER): Payer: No Typology Code available for payment source | Admitting: Anesthesiology

## 2022-03-22 ENCOUNTER — Other Ambulatory Visit: Payer: Self-pay

## 2022-03-22 ENCOUNTER — Ambulatory Visit (HOSPITAL_BASED_OUTPATIENT_CLINIC_OR_DEPARTMENT_OTHER)
Admission: RE | Admit: 2022-03-22 | Discharge: 2022-03-22 | Disposition: A | Discharge status: Home / Self Care | Payer: No Typology Code available for payment source | Attending: General Surgery | Admitting: General Surgery

## 2022-03-22 ENCOUNTER — Ambulatory Visit
Admission: RE | Admit: 2022-03-22 | Discharge: 2022-03-22 | Disposition: A | Discharge status: Home / Self Care | Payer: No Typology Code available for payment source | Source: Ambulatory Visit | Attending: General Surgery | Admitting: General Surgery

## 2022-03-22 ENCOUNTER — Encounter (HOSPITAL_BASED_OUTPATIENT_CLINIC_OR_DEPARTMENT_OTHER): Payer: Self-pay | Admitting: General Surgery

## 2022-03-22 ENCOUNTER — Encounter (HOSPITAL_BASED_OUTPATIENT_CLINIC_OR_DEPARTMENT_OTHER)
Admission: RE | Disposition: A | Discharge status: Home / Self Care | Payer: Self-pay | Source: Home / Self Care | Attending: General Surgery

## 2022-03-22 DIAGNOSIS — C50411 Malignant neoplasm of upper-outer quadrant of right female breast: Secondary | ICD-10-CM

## 2022-03-22 DIAGNOSIS — D0511 Intraductal carcinoma in situ of right breast: Secondary | ICD-10-CM | POA: Diagnosis not present

## 2022-03-22 DIAGNOSIS — Z170 Estrogen receptor positive status [ER+]: Secondary | ICD-10-CM | POA: Diagnosis not present

## 2022-03-22 DIAGNOSIS — Z87891 Personal history of nicotine dependence: Secondary | ICD-10-CM | POA: Insufficient documentation

## 2022-03-22 DIAGNOSIS — Z794 Long term (current) use of insulin: Secondary | ICD-10-CM | POA: Insufficient documentation

## 2022-03-22 DIAGNOSIS — Z79899 Other long term (current) drug therapy: Secondary | ICD-10-CM | POA: Diagnosis not present

## 2022-03-22 DIAGNOSIS — R921 Mammographic calcification found on diagnostic imaging of breast: Secondary | ICD-10-CM | POA: Diagnosis not present

## 2022-03-22 DIAGNOSIS — Z01818 Encounter for other preprocedural examination: Secondary | ICD-10-CM

## 2022-03-22 DIAGNOSIS — E119 Type 2 diabetes mellitus without complications: Secondary | ICD-10-CM | POA: Diagnosis not present

## 2022-03-22 DIAGNOSIS — R928 Other abnormal and inconclusive findings on diagnostic imaging of breast: Secondary | ICD-10-CM | POA: Diagnosis not present

## 2022-03-22 DIAGNOSIS — N641 Fat necrosis of breast: Secondary | ICD-10-CM | POA: Diagnosis not present

## 2022-03-22 HISTORY — PX: BREAST LUMPECTOMY: SHX2

## 2022-03-22 HISTORY — PX: BREAST LUMPECTOMY WITH RADIOACTIVE SEED LOCALIZATION: SHX6424

## 2022-03-22 LAB — GLUCOSE, CAPILLARY
Glucose-Capillary: 98 mg/dL (ref 70–99)
Glucose-Capillary: 98 mg/dL (ref 70–99)

## 2022-03-22 SURGERY — BREAST LUMPECTOMY WITH RADIOACTIVE SEED LOCALIZATION
Anesthesia: General | Site: Breast | Laterality: Right

## 2022-03-22 MED ORDER — FENTANYL CITRATE (PF) 100 MCG/2ML IJ SOLN
INTRAMUSCULAR | Status: DC | PRN
  Administered 2022-03-22 (×2): 50 ug via INTRAVENOUS

## 2022-03-22 MED ORDER — CEFAZOLIN SODIUM-DEXTROSE 2-4 GM/100ML-% IV SOLN
2.0000 g | INTRAVENOUS | Status: AC
  Administered 2022-03-22: 2 g via INTRAVENOUS

## 2022-03-22 MED ORDER — ATROPINE SULFATE 0.4 MG/ML IV SOLN
INTRAVENOUS | Status: AC
  Filled 2022-03-22: qty 1

## 2022-03-22 MED ORDER — LIDOCAINE 2% (20 MG/ML) 5 ML SYRINGE
INTRAMUSCULAR | Status: AC
  Filled 2022-03-22: qty 5

## 2022-03-22 MED ORDER — ACETAMINOPHEN 500 MG PO TABS
ORAL_TABLET | ORAL | Status: AC
  Filled 2022-03-22: qty 2

## 2022-03-22 MED ORDER — ACETAMINOPHEN 500 MG PO TABS
1000.0000 mg | ORAL_TABLET | ORAL | Status: AC
  Administered 2022-03-22: 1000 mg via ORAL

## 2022-03-22 MED ORDER — DEXAMETHASONE SODIUM PHOSPHATE 10 MG/ML IJ SOLN
INTRAMUSCULAR | Status: AC
  Filled 2022-03-22: qty 1

## 2022-03-22 MED ORDER — LACTATED RINGERS IV SOLN: INTRAVENOUS | Status: DC

## 2022-03-22 MED ORDER — AMISULPRIDE (ANTIEMETIC) 5 MG/2ML IV SOLN: 10.0000 mg | Freq: Once | INTRAVENOUS | Status: DC | PRN

## 2022-03-22 MED ORDER — ONDANSETRON HCL 4 MG/2ML IJ SOLN: 4.0000 mg | Freq: Once | INTRAMUSCULAR | Status: DC | PRN

## 2022-03-22 MED ORDER — PROPOFOL 500 MG/50ML IV EMUL
INTRAVENOUS | Status: DC | PRN
  Administered 2022-03-22: 200 ug/kg/min via INTRAVENOUS

## 2022-03-22 MED ORDER — SUCCINYLCHOLINE CHLORIDE 200 MG/10ML IV SOSY
PREFILLED_SYRINGE | INTRAVENOUS | Status: AC
  Filled 2022-03-22: qty 10

## 2022-03-22 MED ORDER — MIDAZOLAM HCL 2 MG/2ML IJ SOLN
INTRAMUSCULAR | Status: AC
  Filled 2022-03-22: qty 2

## 2022-03-22 MED ORDER — ONDANSETRON HCL 4 MG/2ML IJ SOLN
INTRAMUSCULAR | Status: AC
  Filled 2022-03-22: qty 2

## 2022-03-22 MED ORDER — DEXAMETHASONE SODIUM PHOSPHATE 4 MG/ML IJ SOLN
INTRAMUSCULAR | Status: DC | PRN
  Administered 2022-03-22: 5 mg via INTRAVENOUS

## 2022-03-22 MED ORDER — LIDOCAINE-EPINEPHRINE (PF) 1 %-1:200000 IJ SOLN
INTRAMUSCULAR | Status: DC | PRN
  Administered 2022-03-22: 20 mL via SUBCUTANEOUS

## 2022-03-22 MED ORDER — MIDAZOLAM HCL 5 MG/5ML IJ SOLN
INTRAMUSCULAR | Status: DC | PRN
  Administered 2022-03-22: 1 mg via INTRAVENOUS

## 2022-03-22 MED ORDER — PROPOFOL 10 MG/ML IV BOLUS
INTRAVENOUS | Status: DC | PRN
  Administered 2022-03-22: 150 mg via INTRAVENOUS

## 2022-03-22 MED ORDER — FENTANYL CITRATE (PF) 100 MCG/2ML IJ SOLN: 25.0000 ug | INTRAMUSCULAR | Status: DC | PRN

## 2022-03-22 MED ORDER — EPHEDRINE 5 MG/ML INJ
INTRAVENOUS | Status: AC
  Filled 2022-03-22: qty 5

## 2022-03-22 MED ORDER — EPHEDRINE SULFATE (PRESSORS) 50 MG/ML IJ SOLN
INTRAMUSCULAR | Status: DC | PRN
  Administered 2022-03-22: 10 mg via INTRAVENOUS

## 2022-03-22 MED ORDER — PHENYLEPHRINE 80 MCG/ML (10ML) SYRINGE FOR IV PUSH (FOR BLOOD PRESSURE SUPPORT)
PREFILLED_SYRINGE | INTRAVENOUS | Status: AC
  Filled 2022-03-22: qty 10

## 2022-03-22 MED ORDER — 0.9 % SODIUM CHLORIDE (POUR BTL) OPTIME
TOPICAL | Status: DC | PRN
  Administered 2022-03-22: 20 mL

## 2022-03-22 MED ORDER — HYDROCODONE-ACETAMINOPHEN 5-325 MG PO TABS: 1.0000 | ORAL_TABLET | Freq: Four times a day (QID) | ORAL | 0 refills | Status: DC | PRN

## 2022-03-22 MED ORDER — CEFAZOLIN SODIUM-DEXTROSE 2-4 GM/100ML-% IV SOLN
INTRAVENOUS | Status: AC
  Filled 2022-03-22: qty 100

## 2022-03-22 MED ORDER — FENTANYL CITRATE (PF) 100 MCG/2ML IJ SOLN
INTRAMUSCULAR | Status: AC
  Filled 2022-03-22: qty 2

## 2022-03-22 MED ORDER — LIDOCAINE HCL (CARDIAC) PF 100 MG/5ML IV SOSY
PREFILLED_SYRINGE | INTRAVENOUS | Status: DC | PRN
  Administered 2022-03-22: 60 mg via INTRAVENOUS

## 2022-03-22 SURGICAL SUPPLY — 50 items
ADH SKN CLS APL DERMABOND .7 (GAUZE/BANDAGES/DRESSINGS) ×1
APL PRP STRL LF DISP 70% ISPRP (MISCELLANEOUS) ×1
BINDER BREAST LRG (GAUZE/BANDAGES/DRESSINGS) IMPLANT
BINDER BREAST XLRG (GAUZE/BANDAGES/DRESSINGS) IMPLANT
BINDER BREAST XXLRG (GAUZE/BANDAGES/DRESSINGS) IMPLANT
BLADE SURG 10 STRL SS (BLADE) ×2 IMPLANT
CANISTER SUC SOCK COL 7IN (MISCELLANEOUS) IMPLANT
CANISTER SUCT 1200ML W/VALVE (MISCELLANEOUS) IMPLANT
CHLORAPREP W/TINT 26 (MISCELLANEOUS) ×2 IMPLANT
CLIP TI LARGE 6 (CLIP) ×2 IMPLANT
COVER BACK TABLE 60X90IN (DRAPES) ×2 IMPLANT
COVER MAYO STAND STRL (DRAPES) ×2 IMPLANT
COVER PROBE W GEL 5X96 (DRAPES) ×2 IMPLANT
DERMABOND ADVANCED .7 DNX12 (GAUZE/BANDAGES/DRESSINGS) ×2 IMPLANT
DRAPE LAPAROSCOPIC ABDOMINAL (DRAPES) ×2 IMPLANT
DRAPE UTILITY XL STRL (DRAPES) ×2 IMPLANT
ELECT COATED BLADE 2.86 ST (ELECTRODE) ×2 IMPLANT
ELECT REM PT RETURN 9FT ADLT (ELECTROSURGICAL) ×1
ELECTRODE REM PT RTRN 9FT ADLT (ELECTROSURGICAL) ×2 IMPLANT
GAUZE SPONGE 4X4 12PLY STRL LF (GAUZE/BANDAGES/DRESSINGS) ×2 IMPLANT
GLOVE BIO SURGEON STRL SZ 6 (GLOVE) ×2 IMPLANT
GLOVE BIOGEL PI IND STRL 6.5 (GLOVE) ×2 IMPLANT
GLOVE BIOGEL PI IND STRL 7.0 (GLOVE) IMPLANT
GLOVE BIOGEL PI IND STRL 8 (GLOVE) IMPLANT
GLOVE SURG SS PI 8.0 STRL IVOR (GLOVE) IMPLANT
GOWN STRL REUS W/ TWL LRG LVL3 (GOWN DISPOSABLE) ×2 IMPLANT
GOWN STRL REUS W/ TWL XL LVL3 (GOWN DISPOSABLE) IMPLANT
GOWN STRL REUS W/TWL 2XL LVL3 (GOWN DISPOSABLE) ×2 IMPLANT
GOWN STRL REUS W/TWL LRG LVL3 (GOWN DISPOSABLE) ×1
GOWN STRL REUS W/TWL XL LVL3 (GOWN DISPOSABLE) ×1
KIT MARKER MARGIN INK (KITS) ×2 IMPLANT
NDL HYPO 25X1 1.5 SAFETY (NEEDLE) ×2 IMPLANT
NEEDLE HYPO 25X1 1.5 SAFETY (NEEDLE) ×1 IMPLANT
NS IRRIG 1000ML POUR BTL (IV SOLUTION) ×2 IMPLANT
PACK BASIN DAY SURGERY FS (CUSTOM PROCEDURE TRAY) ×2 IMPLANT
PENCIL SMOKE EVACUATOR (MISCELLANEOUS) ×2 IMPLANT
SLEEVE SCD COMPRESS KNEE MED (STOCKING) ×2 IMPLANT
SPONGE T-LAP 18X18 ~~LOC~~+RFID (SPONGE) ×2 IMPLANT
STRIP CLOSURE SKIN 1/2X4 (GAUZE/BANDAGES/DRESSINGS) ×2 IMPLANT
SUT MNCRL AB 4-0 PS2 18 (SUTURE) ×2 IMPLANT
SUT SILK 2 0 SH (SUTURE) IMPLANT
SUT VIC AB 2-0 SH 27 (SUTURE) ×1
SUT VIC AB 2-0 SH 27XBRD (SUTURE) ×2 IMPLANT
SUT VIC AB 3-0 SH 27 (SUTURE) ×1
SUT VIC AB 3-0 SH 27X BRD (SUTURE) ×2 IMPLANT
SYR CONTROL 10ML LL (SYRINGE) ×2 IMPLANT
TOWEL GREEN STERILE FF (TOWEL DISPOSABLE) ×2 IMPLANT
TRAY FAXITRON CT DISP (TRAY / TRAY PROCEDURE) ×2 IMPLANT
TUBE CONNECTING 20X1/4 (TUBING) IMPLANT
YANKAUER SUCT BULB TIP NO VENT (SUCTIONS) IMPLANT

## 2022-03-22 NOTE — Anesthesia Postprocedure Evaluation (Signed)
Anesthesia Post Note  Patient: Kelli [name]  Procedure(s) Performed: RIGHT BREAST LUMPECTOMY WITH RADIOACTIVE SEED LOCALIZATION (Right: Breast)     Patient location during evaluation: PACU Anesthesia Type: General Level of consciousness: awake Pain management: pain level controlled Vital Signs Assessment: post-procedure vital signs reviewed and stable Respiratory status: spontaneous breathing, nonlabored ventilation, respiratory function stable and patient connected to nasal cannula oxygen Cardiovascular status: blood pressure returned to baseline and stable Postop Assessment: no apparent nausea or vomiting Anesthetic complications: no   No notable events documented.  Last Vitals:  Vitals:   03/22/22 1330 03/22/22 1344  BP:  (!) 123/57  Pulse: 63 70  Resp: 17 18  Temp:  (!) 36.3 C  SpO2: 96% 95%    Last Pain:  Vitals:   03/22/22 1344  TempSrc: Oral  PainSc: 0-No pain                 Madsen Riddle P Anjani Feuerborn

## 2022-03-22 NOTE — Transfer of Care (Signed)
Immediate Anesthesia Transfer of Care Note  Patient: VENICE MARCUCCI  Procedure(s) Performed: RIGHT BREAST LUMPECTOMY WITH RADIOACTIVE SEED LOCALIZATION (Right: Breast)  Patient Location: PACU  Anesthesia Type:General  Level of Consciousness: awake, alert  and oriented  Airway & Oxygen Therapy: Patient Spontanous Breathing and Patient connected to face mask oxygen  Post-op Assessment: Report given to RN and Post -op Vital signs reviewed and stable  Post vital signs: Reviewed and stable  Last Vitals:  Vitals Value Taken Time  BP    Temp    Pulse 65 03/22/22 1306  Resp    SpO2 100 % 03/22/22 1306  Vitals shown include unvalidated device data.  Last Pain:  Vitals:   03/22/22 1010  TempSrc: Oral  PainSc: 0-No pain      Patients Stated Pain Goal: 5 (94/70/76 1518)  Complications: No notable events documented.

## 2022-03-22 NOTE — Anesthesia Preprocedure Evaluation (Addendum)
Anesthesia Evaluation  Patient identified by MRN, date of birth, ID band Patient awake    Reviewed: Allergy & Precautions, NPO status , Patient's Chart, lab work & pertinent test results  Airway Mallampati: III  TM Distance: >3 FB Neck ROM: Full    Dental  (+) Lower Dentures, Upper Dentures   Pulmonary former smoker,    Pulmonary exam normal        Cardiovascular negative cardio ROS Normal cardiovascular exam     Neuro/Psych PSYCHIATRIC DISORDERS Anxiety Depression negative neurological ROS     GI/Hepatic negative GI ROS, Neg liver ROS,   Endo/Other  diabetes, Insulin Dependent  Renal/GU negative Renal ROS     Musculoskeletal  (+) Arthritis ,   Abdominal   Peds  Hematology negative hematology ROS (+)   Anesthesia Other Findings RIGHT BREAST CANCER  Reproductive/Obstetrics                            Anesthesia Physical Anesthesia Plan  ASA: 2  Anesthesia Plan: General   Post-op Pain Management:    Induction: Intravenous  PONV Risk Score and Plan: 3 and Ondansetron, Dexamethasone and Treatment may vary due to age or medical condition  Airway Management Planned: LMA  Additional Equipment:   Intra-op Plan:   Post-operative Plan: Extubation in OR  Informed Consent: I have reviewed the patients History and Physical, chart, labs and discussed the procedure including the risks, benefits and alternatives for the proposed anesthesia with the patient or authorized representative who has indicated his/her understanding and acceptance.     Dental advisory given  Plan Discussed with: CRNA  Anesthesia Plan Comments:        Anesthesia Quick Evaluation

## 2022-03-22 NOTE — Op Note (Signed)
Right Breast Radioactive seed localized lumpectomy  Indications: This patient presents with history of right breast cancer, upper outer quadrant, cTis, intermediate grade DCIS, strongly ER/PR positive  Pre-operative Diagnosis: right breast cancer  Post-operative Diagnosis: Same  Surgeon: [name]   Assist: Malachi Pro, PA-C  Anesthesia: General endotracheal anesthesia  ASA Class: 2  Procedure Details  The patient was seen in the Holding Room. The risks, benefits, complications, treatment options, and expected outcomes were discussed with the patient. The possibilities of bleeding, infection, the need for additional procedures, failure to diagnose a condition, and creating a complication requiring other procedures or operations were discussed with the patient. The patient concurred with the proposed plan, giving informed consent.  The site of surgery properly noted/marked. The patient was taken to Operating Room # 8, identified, and the procedure verified as Right breast seed localized lumpectomy.  The right breast and chest were prepped and draped in standard fashion. A superolateral circumareolar incision was made near the previously placed radioactive seed.  Dissection was carried down around the point of maximum signal intensity. The dissection was down to the fascia/implant capsule.  The capsule was not violated and the implant was not seen directly.  The cautery was used to perform the dissection.   The specimen was inked with the margin marker paint kit.    Specimen radiography confirmed inclusion of the mammographic lesion, the clip, and the seed.  The background signal in the breast was zero.  On 3D specimen mammogram the seed/clip appeared to be close to the medial border so additional tissue was taken.  Hemostasis was achieved with cautery.  The cavity was marked with clips on each border other than the anterior border.  The wound was irrigated and closed with 3-0 vicryl interrupted  deep dermal sutures and 4-0 monocryl running subcuticular suture.      Sterile dressings were applied. At the end of the operation, all sponge, instrument, and needle counts were correct.   Findings: Seed, clip in specimen.  anterior margin is skin, posterior margin is implant capsule.   Estimated Blood Loss:  min         Specimens: left breast tissue with seed         Complications:  None; patient tolerated the procedure well.         Disposition: PACU - hemodynamically stable.         Condition: stable

## 2022-03-22 NOTE — Discharge Instructions (Addendum)
[place] Office Phone Number [phone]  BREAST BIOPSY/ PARTIAL MASTECTOMY: POST OP INSTRUCTIONS  Always review your discharge instruction sheet given to you by the facility where your surgery was performed.  IF YOU HAVE DISABILITY OR FAMILY LEAVE FORMS, YOU MUST BRING THEM TO THE OFFICE FOR PROCESSING.  DO NOT GIVE THEM TO YOUR DOCTOR.  Take 2 tylenol (acetominophen) three times a day for 3 days.  If you still have pain, add ibuprofen with food in between if able to take this (if you have kidney issues or stomach issues, do not take ibuprofen).  If both of those are not enough, add the narcotic pain pill.  If you find you are needing a lot of this overnight after surgery, call the next morning for a refill.    Prescriptions will not be filled after 5pm or on week-ends. Take your usually prescribed medications unless otherwise directed You should eat very light the first 24 hours after surgery, such as soup, crackers, pudding, etc.  Resume your normal diet the day after surgery. Most patients will experience some swelling and bruising in the breast.  Ice packs and a good support bra will help.  Swelling and bruising can take several days to resolve.  It is common to experience some constipation if taking pain medication after surgery.  Increasing fluid intake and taking a stool softener will usually help or prevent this problem from occurring.  A mild laxative (Milk of Magnesia or Miralax) should be taken according to package directions if there are no bowel movements after 48 hours. Unless discharge instructions indicate otherwise, you may remove your bandages 48 hours after surgery, and you may shower at that time.  You may have steri-strips (small skin tapes) in place directly over the incision.  These strips should be left on the skin at least for for 7-10 days.    ACTIVITIES:  You may resume regular daily activities (gradually increasing) beginning the next day.  Wearing a  good support bra or sports bra (or the breast binder) minimizes pain and swelling.  You may have sexual intercourse when it is comfortable. No heavy lifting for 1-2 weeks (not over around 10 pounds).  You may drive when you no longer are taking prescription pain medication, you can comfortably wear a seatbelt, and you can safely maneuver your car and apply brakes. RETURN TO WORK:  __________3-14 days depending on job. _______________ Dennis Bast should see your doctor in the office for a follow-up appointment approximately two weeks after your surgery.  Your doctor's nurse will typically make your follow-up appointment when she calls you with your pathology report.  Expect your pathology report 3-4 business days after your surgery.  You may call to check if you do not hear from [place] after three days.   WHEN TO CALL YOUR DOCTOR: Fever over 101.0 Nausea and/or vomiting. Extreme swelling or bruising. Continued bleeding from incision. Increased pain, redness, or drainage from the incision.  The clinic staff is available to answer your questions during regular business hours.  Please don't hesitate to call and ask to speak to one of the nurses for clinical concerns.  If you have a medical emergency, go to the nearest emergency room or call 911.  A surgeon from [facility] is always on call at the hospital.  For further questions, please visit [email or web address]    Post Anesthesia Home Care Instructions  Activity: Get plenty of rest for the remainder of the day. A responsible individual must stay  with you for 24 hours following the procedure.  For the next 24 hours, DO NOT: -Drive a car -Paediatric nurse -Drink alcoholic beverages -Take any medication unless instructed by your physician -Make any legal decisions or sign important papers.  Meals: Start with liquid foods such as gelatin or soup. Progress to regular foods as tolerated. Avoid greasy, spicy, heavy foods. If nausea  and/or vomiting occur, drink only clear liquids until the nausea and/or vomiting subsides. Call your physician if vomiting continues.  Special Instructions/Symptoms: Your throat may feel dry or sore from the anesthesia or the breathing tube placed in your throat during surgery. If this causes discomfort, gargle with warm salt water. The discomfort should disappear within 24 hours.

## 2022-03-22 NOTE — Anesthesia Procedure Notes (Signed)
Procedure Name: LMA Insertion Date/Time: 03/22/2022 11:59 AM  Performed by: Bufford Spikes, CRNAPre-anesthesia Checklist: Patient identified, Emergency Drugs available, Suction available and Patient being monitored Patient Re-evaluated:Patient Re-evaluated prior to induction Oxygen Delivery Method: Circle system utilized Preoxygenation: Pre-oxygenation with 100% oxygen Induction Type: IV induction Ventilation: Mask ventilation without difficulty LMA: LMA inserted LMA Size: 4.0 Number of attempts: 1 Airway Equipment and Method: Bite block Placement Confirmation: positive ETCO2 Tube secured with: Tape Dental Injury: Teeth and Oropharynx as per pre-operative assessment

## 2022-03-22 NOTE — Interval H&P Note (Signed)
History and Physical Interval Note:  03/22/2022 11:17 AM  Kelli [name]  has presented today for surgery, with the diagnosis of RIGHT BREAST CANCER.  The various methods of treatment have been discussed with the patient and family. After consideration of risks, benefits and other options for treatment, the patient has consented to  Procedure(s): RIGHT BREAST LUMPECTOMY WITH RADIOACTIVE SEED LOCALIZATION (Right) as a surgical intervention.  The patient's history has been reviewed, patient examined, no change in status, stable for surgery.  I have reviewed the patient's chart and labs.  Questions were answered to the patient's satisfaction.     [name]

## 2022-03-22 NOTE — H&P (Signed)
REFERRING PHYSICIAN: Drew Davis  PROVIDER: Franklyn Cafaro LEDFORD Vinette Crites, MD  Care Team: Patient Care Team: Matthews, Cody Elias, DO as PCP - General (Family Medicine) Corey, Evan S, MD (Sports Medicine) Sherrill, Gary Bradley, MD (Hematology and Oncology) Sayda Grable Ledford, MD as Consulting Provider (Surgical Oncology) Jacobs, Daniel P, MD (Gastroenterology)   MRN: [id number] DOB: 08/15/1945 DATE OF ENCOUNTER: 03/06/2022  Subjective   Chief Complaint: Breast Cancer  History of Present Illness: Kelli [name] is a 76 y.o. female who is seen today as an office consultation at the request of Dr. Sherrill for evaluation of Breast Cancer  Patient is referred by Dr. Davis for a new right breast cancer. The patient was undergoing diagnostic mammography for a left breast follow-up. There was a mass which had been being followed. The patient was also concerned that she might have an additional left breast mass. On diagnostic imaging, the right breast was seen to have 2.5 cm of calcifications that were very posterior. Ultrasound showed a 1.7 cm mass that appeared to contain the calcifications seen on mammography. Axilla was negative. On the left there was a 5 mm oval mass at the 12 o'clock position that was unchanged.  Patient has a personal history of colon cancer and lung cancer. She sees Dr. Sherrill for this. 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 This was in 2010. She had a BRAF mutation detected and take adjuvant Xeloda. Last colonoscopy was in 2021 and she did have polyps in the rectum and ascending colon. Her lung cancer was in 2016 and was treated with surgery.  Family cancer history-none  Work retired graphic designer  Diagnostic mammogram/us 02/17/2022 ACR Breast Density Category c: The breast tissue is heterogeneously dense, which may obscure small masses.  FINDINGS: Full field views of both breasts, magnification views of the RIGHT breast and a spot compression view of the  LEFT breast are performed. The patient has retroglandular implants.  A new group of heterogeneous calcifications within the posterior UPPER RIGHT breast spans a distance of 2.5 cm. The posterior aspect of these calcifications are off the field of view due to the implant.  No other new or suspicious abnormalities are noted within either breast.  Targeted ultrasound is performed, showing the following:  RIGHT:  A 1 x 0.5 x 1.7 cm ill-defined hypoechoic area at the 12 o'clock position of the RIGHT breast 3 cm from the nipple which appears to contain calcifications likely the new calcifications identified mammographically.  No abnormal RIGHT axillary lymph nodes are identified.  LEFT:  A 0.5 x 0.3 x 0.5 cm circumscribed oval hypoechoic mass at the 12 o'clock position of the LEFT breast 3 cm from the nipple, not significantly changed given technical differences.  No sonographic abnormalities are identified in the OUTER LEFT breast at the area of patient concern.  IMPRESSION: 1. New suspicious calcifications within the posterior UPPER RIGHT breast spanning a distance of 2.5 cm, which is likely represented sonographically by a 1.7 cm ill-defined hypoechoic area at the 12 o'clock position of the RIGHT breast containing what appear to be calcifications. Due to far posterior position of the calcifications against the patient's calcified retroglandular implant and probable technical difficulty in performing a stereotactic guided biopsy, ultrasound-guided biopsy is 1st recommended. Attention to post clip mammograms recommended. 2. No significant change in UPPER LEFT breast mass, unchanged for 2 years and considered benign. 3. No mammographic or sonographic abnormality within the OUTER LEFT breast at the site of patient concern.  RECOMMENDATION:   Ultrasound-guided RIGHT breast biopsy, which will be scheduled. If the sonographic area of biopsied does not correspond to the  new calcifications mammographically, then attempt at stereotactic guided biopsy would be recommended.  I have discussed the findings and recommendations with the patient. If applicable, a reminder letter will be sent to the patient regarding the next appointment.  BI-RADS CATEGORY 4: Suspicious.  Pathology core needle biopsy: 02/27/2022 Breast, right, needle core biopsy, 12 o'clock - DUCTAL CARCINOMA IN SITU, INTERMEDIATE GRADE - NO EVIDENCE OF INVASIVE CARCINOMA - NECROSIS: PRESENT - CALCIFICATIONS: PRESENT - DCIS LENGTH: 0.5 CM  Receptors: Estrogen Receptor: 95%, POSITIVE, STRONG STAINING INTENSITY Progesterone Receptor: 50%, POSITIVE, STRONG STAINING INTENSITY  Review of Systems: A complete review of systems was obtained from the patient. I have reviewed this information and discussed as appropriate with the patient. See HPI as well for other ROS. ROS: hearing loss.  Medical History: Past Medical History:  Diagnosis Date  Arthritis  Diabetes mellitus without complication (CMS-HCC)  History of cancer   Patient Active Problem List  Diagnosis  Malignant neoplasm of upper-outer quadrant of right breast in female, estrogen receptor positive  History of colon cancer   Past Surgical History:  Procedure Laterality Date  Breast Surgery 1978  LAPAROSCOPIC PARTIAL RIGHT COLECTOMY WITH ANASTOMOSIS 2010  Video assisted thoracoscopy (vats)/wedge resection (Right) 10/05/2014  Segmentecomy (Right) 10/05/2014  Cryo intercostal nerve block (N/A) 10/05/2014  Lymph node dissection (Right) 10/05/2014  Orif humerus fracture (Left) 10/01/2018  APPENDECTOMY  AUGMENTATION MAMMAPLASTY  Colon Surgery    No Known Allergies  Current Outpatient Medications on File Prior to Visit  Medication Sig Dispense Refill  BASAGLAR KWIKPEN U-100 INSULIN pen injector (concentration 100 units/mL) 35 UNITS AT BED INCREASE BY 2 UNITS EVERY 3 DAYS TIL FASTING SUGAR GOAL 120-150 MAX 60 UNITS PER DAY   blood glucose meter kit TEST BLOOD SUGARS DAILY  calcium carbonate-vitamin D3 1,000 mg-20 mcg (800 unit) Tab Take 1 tablet by mouth once daily  cloNIDine HCL (CATAPRES) 0.1 MG tablet Take 1 tablet by mouth once daily  FLUoxetine (PROZAC) 40 MG capsule Take 1 capsule by mouth once daily  gabapentin (NEURONTIN) 300 MG capsule Take by mouth  pravastatin (PRAVACHOL) 80 MG tablet Take 1 tablet by mouth once daily  multivitamin with B complex-vitamin C (FARBEE WITH C) tablet Take 1 tablet by mouth once daily   No current facility-administered medications on file prior to visit.   Family History  Problem Relation Age of Onset  Stroke Mother  Coronary Artery Disease (Blocked arteries around heart) Mother  Hyperlipidemia (Elevated cholesterol) Sister    Social History   Tobacco Use  Smoking Status Former  Types: Cigarettes  Smokeless Tobacco Not on file    Social History   Socioeconomic History  Marital status: Divorced  Tobacco Use  Smoking status: Former  Types: Cigarettes  Substance and Sexual Activity  Alcohol use: Yes  Drug use: Yes  Comment: Marijuana   Objective:   Vitals:  03/06/22 1518  BP: 126/76  Pulse: 80  SpO2: 95%  Weight: 81.2 kg (179 lb)  Height: 172.7 cm (5' 8")   Body mass index is 27.22 kg/m.  Gen: No acute distress. Well nourished and well groomed.  Neurological: Alert and oriented to person, place, and time. Coordination normal.  Head: Normocephalic and atraumatic.  Eyes: Conjunctivae are normal. Pupils are equal, round, and reactive to light. No scleral icterus.  Neck: Normal range of motion. Neck supple. No tracheal deviation or thyromegaly present.  Cardiovascular:   Normal rate, regular rhythm, normal heart sounds and intact distal pulses. Exam reveals no gallop and no friction rub. No murmur heard. Breast: implants appear intact. No palpable masses except vague sense of fullness upper central left breast. No nipple retraction. No nipple  discharge. No skin dimpling. No LAD.  Respiratory: Effort normal. No respiratory distress. No chest wall tenderness. Breath sounds normal. No wheezes, rales or rhonchi.  GI: Soft. Bowel sounds are normal. The abdomen is soft and nontender. There is no rebound and no guarding.  Musculoskeletal: Normal range of motion. Extremities are nontender.  Lymphadenopathy: No cervical, preauricular, postauricular or axillary adenopathy is present Skin: Skin is warm and dry. No rash noted. No diaphoresis. No erythema. No pallor. No clubbing, cyanosis, or edema.  Psychiatric: Normal mood and affect. Behavior is normal. Judgment and thought content normal.   Labs  Assessment and Plan:   ICD-10-CM  1. Malignant neoplasm of upper-outer quadrant of right breast in female, estrogen receptor positive C50.411 Ambulatory Referral to Genetics  Z17.0   2. History of colon cancer Z85.038 Ambulatory Referral to Genetics    Patient has a new diagnosis of clinical Tis, stage 0 right breast cancer. This can be treated with a lumpectomy. I discussed with the patient that I would plan a seed localized lumpectomy which is an outpatient procedure. I advised that she would need someone with her the night of surgery at home. I discussed postoperative restrictions including no lifting or strenuous activity for 1 week and no swimming for 2 weeks. The patient does have a pool but this is planned to be closed tomorrow.  I discussed risks of bleeding, infection, fluid buildup, heart or lung issues. I discussed the potential need for other procedures.  I advised that I would refer her to genetics given that she has had colon cancer and now breast cancer.  Most likely, surgery would be followed by antihormone treatment. I suspect that unless this is a very large area of DCIS, radiation would not be recommended. However, we will see what the final pathology is and make a determination regarding referral to Rad onc at that point. She  already has oncology established. She is supposed to see Dr. Sherrill after the pathology comes back.  

## 2022-03-23 ENCOUNTER — Encounter (HOSPITAL_BASED_OUTPATIENT_CLINIC_OR_DEPARTMENT_OTHER): Payer: Self-pay | Admitting: General Surgery

## 2022-03-24 LAB — SURGICAL PATHOLOGY

## 2022-04-03 ENCOUNTER — Other Ambulatory Visit: Payer: Self-pay

## 2022-04-05 ENCOUNTER — Inpatient Hospital Stay: Payer: No Typology Code available for payment source | Attending: Oncology | Admitting: Oncology

## 2022-04-05 ENCOUNTER — Other Ambulatory Visit: Payer: Self-pay | Admitting: *Deleted

## 2022-04-05 VITALS — BP 119/59 | HR 75 | Temp 98.2°F | Resp 18 | Ht 68.0 in | Wt 179.6 lb

## 2022-04-05 DIAGNOSIS — D0511 Intraductal carcinoma in situ of right breast: Secondary | ICD-10-CM | POA: Diagnosis not present

## 2022-04-05 DIAGNOSIS — Z79899 Other long term (current) drug therapy: Secondary | ICD-10-CM | POA: Diagnosis not present

## 2022-04-05 DIAGNOSIS — N6320 Unspecified lump in the left breast, unspecified quadrant: Secondary | ICD-10-CM | POA: Diagnosis not present

## 2022-04-05 DIAGNOSIS — Z85038 Personal history of other malignant neoplasm of large intestine: Secondary | ICD-10-CM | POA: Insufficient documentation

## 2022-04-05 DIAGNOSIS — Z85118 Personal history of other malignant neoplasm of bronchus and lung: Secondary | ICD-10-CM | POA: Diagnosis not present

## 2022-04-05 DIAGNOSIS — C50111 Malignant neoplasm of central portion of right female breast: Secondary | ICD-10-CM

## 2022-04-05 MED ORDER — ANASTROZOLE 1 MG PO TABS: 1.0000 mg | ORAL_TABLET | Freq: Every day | ORAL | 3 refills | Status: DC

## 2022-04-05 NOTE — Progress Notes (Signed)
Referral to Radiation oncology placed

## 2022-04-05 NOTE — Progress Notes (Signed)
[facility] OFFICE PROGRESS NOTE   Diagnosis: Lung cancer, DCIS  INTERVAL HISTORY:   Kelli [name] underwent a bilateral mammogram 02/17/2022.  She has retroglandular bilateral implants.  A new group of calcifications were noted in the posterior upper right breast spanning 2.5 cm.  A targeted ultrasound revealed a 1 x 0.5 x 1.7 cm ill-defined hypoechoic area at the 12 o'clock position of the right breast.  No abnormal axillary lymph nodes.  A 0.5 cm circumscribed oval hypoechoic mass was noted at the 12 clock position of the left breast 3 cm from the nipple-not changed. She underwent ultrasound-guided biopsy of the right breast mass calcifications on 02/27/2022.  A tissue marker clip was placed.  The pathology showed intermediate grade DCIS with no evidence of invasive carcinoma.  Necrosis and calcifications are present.  The DCIS measured 0.5 cm.  The DCIS returned with ER 95% and PR 50% staining. She was referred to Dr. [name] and underwent radioactive seed localization and 17 2023 followed by radioactive seed localized lumpectomy on 03/22/2022.  The anterior margin was identified and skin in the posterior margin as the implant capsule. The pathology revealed CIS, nuclear grade 2, measuring 11 x 9 x 8 mm.  The margins are negative with the closest margin being the anterior margin at 2 mm.  The superior margin measured 3 mm, the medial margin 4 mm, and the posterior margin 6 mm.  No lymph nodes were examined.  She reports feeling well prior to the lumpectomy procedure.  No palpable change at the right breast.  Shenoted intermittent "sharp "pains lasting seconds in the left breast are to surgery.  This has persisted. Objective:  Vital signs in last 24 hours:  Blood pressure (!) 119/59, pulse 75, temperature 98.2 F (36.8 C), temperature source Oral, resp. rate 18, height _0  (1.727 m), weight 179 lb 9.6 oz (81.5 kg), SpO2 98 %.    HEENT: Neck without mass Lymphatics: No  cervical, supraclavicular, or axillary nodes Resp: Coarse rhonchi with decreased breath sounds at the right posterior base, no respiratory distress Cardio: Regular rate and rhythm GI: No hepatosplenomegaly Vascular: No leg edema Breast: Bilateral breast implants, healing lumpectomy incision at the right supra areolar area.  Tender throughout the left breast-increased at the lateral aspect of the left breast without a discrete mass  Portacath/PICC-without erythema  Lab Results:  Lab Results  Component Value Date   WBC 7.6 07/21/2021   HGB 12.9 07/21/2021   HCT 39.3 07/21/2021   MCV 92.0 07/21/2021   PLT 298 07/21/2021   NEUTROABS 4,621 07/21/2021    CMP  Lab Results  Component Value Date   NA 139 03/14/2022   K 4.3 03/14/2022   CL 103 03/14/2022   CO2 26 03/14/2022   GLUCOSE 176 (H) 03/14/2022   BUN 13 03/14/2022   CREATININE 0.81 03/14/2022   CALCIUM 9.6 03/14/2022   PROT 6.7 07/21/2021   ALBUMIN 2.1 (L) 09/20/2020   AST 17 07/21/2021   ALT 18 07/21/2021   ALKPHOS 100 09/20/2020   BILITOT 0.4 07/21/2021   GFRNONAA >60 03/14/2022   GFRAA 87 11/08/2020    Lab Results  Component Value Date   CEA 1.8 08/28/2014    Lab Results  Component Value Date   INR 0.96 10/01/2014   LABPROT 12.9 10/01/2014    Imaging:  No results found.  Medications: I have reviewed the patient's current medications.   Assessment/Plan: 1.Stage II (T3 N0) adenocarcinoma the cecum diagnosed in September 2010,  status post adjuvant Xeloda MSI-high, BRAF mutation detected 2. History of colonic polyps-status post a surveillance colonoscopy in February 2016 with a tubular adenoma removed Colonoscopy 01/30/2020-polyps removed from the rectum and descending colon, hyperplastic polyps 3. History of heavy tobacco use, quit in 2010 4. History of lung nodules-last imaging was an abdomen CT in October 2013 prior to repeat imaging 09/03/2014 CT 09/03/2014 revealed enlargement of a spiculated right  lower lobe nodule PET scan 09/16/2014 revealed low-level FDG activity associated with the right lower lobe spiculated nodule Status post a right lower lobe basilar segmentectomy and mediastinal lymph node dissection on 10/06/2014 with the pathology confirming a minimally invasive well-differentiated adenocarcinoma,pT1a,N0, invasive component measured less than 0.3 cm, with negative surgical margins CT chest 09/15/2015 with no evidence of recurrent lung cancer, stable nodules except for a probable new 4 mm right lower lobe subpleural nodule CT chest 06/19/2016-no evidence of recurrent lung cancer, stable lung nodules CT chest 03/20/2017-stable lung nodules CT chest 03/26/2018- stable lung nodules CT chest 04/09/2019-stable lung nodules CT chest 04/10/2019-stable lung nodules CT chest 10/25/2020-stable lung nodules.  Resolution of multifocal pulmonary infiltrate and improvement in bronchial wall thickening. CT chest 01/18/2021-stable small solid nodules and groundglass opacities. CT chest 07/23/2021-subpleural focal area of groundglass attenuation within the anterior left upper lobe mildly increased when compared to remote priors, stable when compared to more recent priors.  Additional multiple bilateral pulmonary nodules grossly stable when compared to chest CT exams dating back to 2018. CT chest 01/19/2022-stable upper lobe groundglass opacity, multiple stable bilateral pulmonary nodules   5.  Post thoracotomy pain-resolved   6.  Left humerus fracture following a fall, status post surgical repair 10/01/2018  7.  Breast DCIS Mammogram 02/17/2022-new group of heterogenous calcifications in the posterior upper right breast spanning 2.5 cm Targeted right breast ultrasound 915 2023-1 x 0.5 x 1.7 cm ill-defined hypoechoic area at the 12 o'clock position of the right breast with calcifications no abnormal right axillary lymph nodes Ultrasound-guided right breast biopsy 02/27/2022-media grade DCIS, no invasive  carcinoma, necrosis and calcifications present, DCIS length 0.5 cm, ER 95%, PR 50% Seed localized right lumpectomy 03/22/2022, anterior margin skin, posterior margin implant capsule-DCIS, grade 2, 11 x 9 x 8 mm, necrosis present, all margins negative, closest margin anterior-2 mm, superior-3 mm, medial-4 mm, posterior-6 mm, inferior-9 mm, lateral 24 mm.  No lymph nodes submitted    Disposition: Ms. Vasseur has has a remote history of colon cancer and lung cancer.  She has been diagnosed with DCIS of the right breast.  She underwent a seed localized lumpectomy for intermediate grade DCIS and the surgical margins are negative.  I discussed the prognosis and adjuvant treatment options with Ms. Recker.  We discussed the decrease in recurrence rate with radiation.  We discussed the decreased recurrence of DCIS and development of invasive breast cancer with adjuvant hormonal therapy.  I explained the lack of a clear survival benefit with both radiation and hormonal therapy.  The tumor is ER positive and PR positive.  I recommend adjuvant anastrozole therapy.  We reviewed potential toxicities associated with anastrozole including the chance of hot flashes, arthralgias, crease bone density, and an altered cholesterol profile.  She reports a history of decreased bone density.  She is taking calcium and vitamin D.  I recommended she continue calcium and vitamin D and have a bone density scan every 2 years.  We will refer her to Dr. Sondra Come to consider the indication for adjuvant therapy.  Foregoing radiation could be  considered given the small tumor size, intermediate grade, and negative margins.  However several of the margins are close and adjuvant radiation may be recommended.  She will begin adjuvant anastrozole after seeing Dr. Sondra Come.  She will return for an office visit in 6 months.  The etiology of the sharp pain in the left breast is unclear.  This could be related to the left breast implant.  She will  ask Dr. [name] to evaluate the left breast.  Betsy Coder, MD  04/05/2022  1:24 PM

## 2022-04-10 NOTE — Progress Notes (Addendum)
Location of Breast Cancer: DCIS of the right breast   Histology per Pathology Report:   02/27/2022 Diagnosis Breast, right, needle core biopsy, 12 o'clock - DUCTAL CARCINOMA IN SITU, INTERMEDIATE GRADE - NO EVIDENCE OF INVASIVE CARCINOMA - NECROSIS: PRESENT - CALCIFICATIONS: PRESENT - DCIS LENGTH: 0.5 CM  03/22/2022 FINAL MICROSCOPIC DIAGNOSIS:   A. BREAST, RIGHT W/SEED, LUMPECTOMY:  -  Ductal carcinoma in situ (DCIS), solid and cribriform type with  comedonecrosis and calcifications, nuclear grade 2 of 3.  -  Margins negative closest margin anterior at 2 mm (see synoptic report  for additional margins and additional information)   B. BREAST, ADDITIONAL RIGHT MEDIAL MARGIN, EXCISION:  -  Focal ductal carcinoma in situ in new additional right medial margin,  see description and diagnoses above (4 mm from new margin).   Receptor Status: ER(95%), PR (50%), Her2-neu (), Ki-()  Did patient present with symptoms (if so, please note symptoms) or was this found on screening mammography?: she had shooting pains in her left breast.  Past/Anticipated interventions by surgeon, if any: right breast radioactive seed localized lumpectomy by Dr. [name] on 03/22/2022  Past/Anticipated interventions by medical oncology, if any: adjuvant anastrozole therapy.  She started taking it a few days ago.  Lymphedema issues, if any:  no    Pain issues, if any:  no   SAFETY ISSUES: Prior radiation? no Pacemaker/ICD? no Possible current pregnancy?no Is the patient on methotrexate? no  Current Complaints / other details: She has bil breast implants.      Jacqulyn Liner, RN 04/10/2022,9:28 AM

## 2022-04-11 NOTE — Progress Notes (Signed)
Radiation Oncology         [phone] ________________________________  Initial Outpatient Consultation  Name: Kelli [name] MRN: [id number]  Date: 04/12/2022  DOB: 1945-06-15  BP:ZWCHENID, [name], DO  [name], MD   REFERRING PHYSICIAN: [name], MD  DIAGNOSIS: There were no encounter diagnoses.  Stage 0 (cTis (DCIS), cN0, cM0) Right Breast, Intermediate grade ductal carcinoma in-situ, ER+ / PR+ / Her2 not assessed  HISTORY OF PRESENT ILLNESS::Kelli [name] is a 76 y.o. female who is accompanied by ***. she is seen as a courtesy of Dr. Benay Spice for an opinion concerning radiation therapy as part of management for her recently diagnosed right breast DCIS. The patient also has a history of colon cancer and right lung cancer for which she is followed by Dr. Benay Spice for.   The patient presented with a new palpable left breast lump this past September 2023. This prompted a digital diagnostic mammogram and bilateral breast ultrasound on 02/17/22 which revealed: a 1 x 0.5 x 1.7 cm ill-defined hypoechoic area at the 12 o'clock right breast, 3 cmfn, possibly containing calcifications. Imaging also showed a known 0.5 x 0.3 x 0.5 cm mass in the 12 o'clock left breast which appeared to stable since imaging in 2021. No abnormal lymph nodes were appreciated in either axilla.   Biopsy of the new 12 o'clock right breast mass on 02/27/22 revealed intermediate grade DCIS measuring 0.5 cm, with necrosis and calcifications. Prognostic indicators significant for: estrogen receptor 95% positive and progesterone receptor 50% positive, both with strong staining intensity; Her2 status not assessed.   The patient was accordingly referred to Dr. [name] and opted to proceed with right breast lumpectomy on 03/22/22. Pathology from the procedure revealed: intermediate grade DCIS measuring 11 x 9 x 8 mm with comedonecrosis and calcifications. All final margins negative for DCIS, with the closest margin  being 2 mm from the anterior margin. Prognostic indicators significant for: estrogen receptor 95% positive and progesterone receptor 50% positive, both with strong staining intensity; Her2 status not assessed.   During her most recent follow up visit with Dr. Benay Spice on 04/05/22, the patient consented to proceed with adjuvant anastrozole therapy.   Of note: the patient has retroglandular bilateral breast implants    PREVIOUS RADIATION THERAPY: No  PAST MEDICAL HISTORY:  Past Medical History:  Diagnosis Date   Anxiety    Arthritis    Borderline diabetic    Colon cancer ([place])    colon ca dx 02/2009, lung cancer   Depression    Depression with anxiety 09/19/2011   H/O colon cancer, stage II 09/19/2011   T3N0  [place] Sept 2010   High cholesterol    Hyperlipidemia 09/19/2011   Lung nodules 09/19/2011   granulomas   Osteoporosis 2022   Urinary urgency     PAST SURGICAL HISTORY: Past Surgical History:  Procedure Laterality Date   APPENDECTOMY     AUGMENTATION MAMMAPLASTY     BREAST LUMPECTOMY WITH RADIOACTIVE SEED LOCALIZATION Right 03/22/2022   Procedure: RIGHT BREAST LUMPECTOMY WITH RADIOACTIVE SEED LOCALIZATION;  Surgeon: [name], MD;  Location: [place];  Service: General;  Laterality: Right;   BREAST SURGERY  1978   augmentation   COLON SURGERY     2010   CRYO INTERCOSTAL NERVE BLOCK N/A 10/05/2014   Procedure: CRYO INTERCOSTAL NERVE BLOCK;  Surgeon: [name], MD;  Location: [place];  Service: Thoracic;  Laterality: N/A;   LYMPH NODE DISSECTION Right 10/05/2014  Procedure: LYMPH NODE DISSECTION;  Surgeon: [name], MD;  Location: [place];  Service: Thoracic;  Laterality: Right;   ORIF HUMERUS FRACTURE Left 10/01/2018   Procedure: OPEN REDUCTION INTERNAL FIXATION (ORIF) LEFT PROXIMAL HUMERUS FRACTURE;  Surgeon: [name], MD;  Location: [place];  Service: Orthopedics;  Laterality: Left;   PARTIAL COLECTOMY Right 2010    SEGMENTECOMY Right 10/05/2014   Procedure: right lower lobe SEGMENTECTOMY;  Surgeon: [name], MD;  Location: [place];  Service: Thoracic;  Laterality: Right;   VIDEO ASSISTED THORACOSCOPY (VATS)/WEDGE RESECTION Right 10/05/2014   Procedure: Right VIDEO ASSISTED THORACOSCOPY (VATS) with right lower lobe lung nodule WEDGE RESECTION;  Surgeon: [name], MD;  Location: [place];  Service: Thoracic;  Laterality: Right;    FAMILY HISTORY:  Family History  Problem Relation Age of Onset   Heart disease Mother    Colon polyps Sister    Heart disease Brother    Cancer Brother    Colon cancer Neg Hx    Esophageal cancer Neg Hx    Stomach cancer Neg Hx    Rectal cancer Neg Hx     SOCIAL HISTORY:  Social History   Tobacco Use   Smoking status: Former    Packs/day: 3.00    Years: 40.00    Total pack years: 120.00    Types: Cigarettes    Start date: 1962    Quit date: 02/06/2009    Years since quitting: 13.1   Smokeless tobacco: Never  Vaping Use   Vaping Use: Never used  Substance Use Topics   Alcohol use: Not Currently    Comment: 1-2 beers every other week   Drug use: No    ALLERGIES: No Known Allergies  MEDICATIONS:  Current Outpatient Medications  Medication Sig Dispense Refill   AMBULATORY NON FORMULARY MEDICATION Single glucometer with lancets, test strips. Test daily.  Disp qs 3 months E11.9 1 each 0   anastrozole (ARIMIDEX) 1 MG tablet Take 1 tablet (1 mg total) by mouth daily. 90 tablet 3   B Complex-C (B-COMPLEX WITH VITAMIN C) tablet Take 1 tablet by mouth daily.     B-D UF III MINI PEN NEEDLES 31G X 5 MM MISC USE AS DIRECTED 100 each 12   Calcium Carb-Cholecalciferol (CALCIUM 1000 + D) 1000-20 MG-MCG TABS Take 1 tablet by mouth daily. 60 tablet 2   Cholecalciferol (VITAMIN D) 2000 UNITS tablet Take 2,000 Units by mouth daily.     cloNIDine (CATAPRES) 0.1 MG tablet TAKE 1 TABLET BY MOUTH EVERY DAY (Patient taking differently: No sig reported) 90  tablet 1   FLUoxetine (PROZAC) 40 MG capsule TAKE 1 CAPSULE BY MOUTH EVERY DAY 90 capsule 1   gabapentin (NEURONTIN) 300 MG capsule TAKE 1 CAPSULE (300 MG TOTAL) BY MOUTH AT BEDTIME AS NEEDED (INSOMNIA). 90 capsule 0   glucose blood (ONETOUCH VERIO) test strip TEST BLOOD SUGARS DAILY 300 strip 1   Insulin Glargine (BASAGLAR KWIKPEN) 100 UNIT/ML 35 UNITS AT BED INCREASE BY 2 UNITS EVERY 3 DAYS TIL FASTING SUGAR GOAL 120-150 MAX 60 UNITS PER DAY 3 mL 11   Lancets (ONETOUCH DELICA PLUS SUORVI15P) MISC TEST BLOOD SUGARS DAILY 100 each 0   pravastatin (PRAVACHOL) 80 MG tablet TAKE 1 TABLET BY MOUTH EVERY DAY 90 tablet 3   Current Facility-Administered Medications  Medication Dose Route Frequency Provider Last Rate Last Admin   denosumab (PROLIA) injection 60 mg  60 mg Subcutaneous Once Silverio Decamp, MD  REVIEW OF SYSTEMS:  A 10+ POINT REVIEW OF SYSTEMS WAS OBTAINED including neurology, dermatology, psychiatry, cardiac, respiratory, lymph, extremities, GI, GU, musculoskeletal, constitutional, reproductive, HEENT. ***   PHYSICAL EXAM:  vitals were not taken for this visit.   General: Alert and oriented, in no acute distress HEENT: Head is normocephalic. Extraocular movements are intact. Oropharynx is clear. Neck: Neck is supple, no palpable cervical or supraclavicular lymphadenopathy. Heart: Regular in rate and rhythm with no murmurs, rubs, or gallops. Chest: Clear to auscultation bilaterally, with no rhonchi, wheezes, or rales. Abdomen: Soft, nontender, nondistended, with no rigidity or guarding. Extremities: No cyanosis or edema. Lymphatics: see Neck Exam Skin: No concerning lesions. Musculoskeletal: symmetric strength and muscle tone throughout. Neurologic: Cranial nerves II through XII are grossly intact. No obvious focalities. Speech is fluent. Coordination is intact. Psychiatric: Judgment and insight are intact. Affect is appropriate.  Left Breast: no palpable mass, nipple  discharge or bleeding. Right Breast: ***  ECOG = ***  0 - Asymptomatic (Fully active, able to carry on all predisease activities without restriction)  1 - Symptomatic but completely ambulatory (Restricted in physically strenuous activity but ambulatory and able to carry out work of a light or sedentary nature. For example, light housework, office work)  2 - Symptomatic, <50% in bed during the day (Ambulatory and capable of all self care but unable to carry out any work activities. Up and about more than 50% of waking hours)  3 - Symptomatic, >50% in bed, but not bedbound (Capable of only limited self-care, confined to bed or chair 50% or more of waking hours)  4 - Bedbound (Completely disabled. Cannot carry on any self-care. Totally confined to bed or chair)  5 - Death   Eustace Pen MM, Creech RH, Tormey DC, et al. [phone]). "Toxicity and response criteria of the [facility]". [place] Oncol. 5 (6): 649-55  LABORATORY DATA:  Lab Results  Component Value Date   WBC 7.6 07/21/2021   HGB 12.9 07/21/2021   HCT 39.3 07/21/2021   MCV 92.0 07/21/2021   PLT 298 07/21/2021   NEUTROABS 4,621 07/21/2021   Lab Results  Component Value Date   NA 139 03/14/2022   K 4.3 03/14/2022   CL 103 03/14/2022   CO2 26 03/14/2022   GLUCOSE 176 (H) 03/14/2022   BUN 13 03/14/2022   CREATININE 0.81 03/14/2022   CALCIUM 9.6 03/14/2022      RADIOGRAPHY: MM Breast Surgical Specimen  Result Date: 03/22/2022 CLINICAL DATA:  Status post lumpectomy after earlier radioactive seed localization. EXAM: SPECIMEN RADIOGRAPH OF THE RIGHT BREAST COMPARISON:  Previous exam(s). FINDINGS: Status post excision of the right breast. The radioactive seed and biopsy marker clip are present and appear completely intact within the specimen. Findings discussed with the OR staff during the procedure. IMPRESSION: Specimen radiograph of the right breast. Electronically Signed   By: Franki Cabot M.D.   On:  03/22/2022 12:34  MM RT RADIOACTIVE SEED LOC MAMMO GUIDE  Addendum Date: 03/30/2022   ADDENDUM REPORT: 03/21/2022 10:05 ADDENDUM: Discussed by telephone on 03/21/2022 at 10:00 am with [name] , who verbally acknowledged these results. Today's images annotated for Dr. [name]. Electronically Signed   By: Franki Cabot M.D.   On: 03/21/2022 10:05   Result Date: 03/21/2022 CLINICAL DATA:  Patient with a RIGHT breast DCIS scheduled for lumpectomy requiring preoperative radioactive seed localization. EXAM: MAMMOGRAPHIC GUIDED RADIOACTIVE SEED LOCALIZATION OF THE RIGHT BREAST COMPARISON:  Previous exam(s). FINDINGS: Patient presents for radioactive  seed localization prior to lumpectomy. I met with the patient and we discussed the procedure of seed localization including benefits and alternatives. We discussed the high likelihood of a successful procedure. We discussed the risks of the procedure including infection, bleeding, tissue injury and further surgery. We discussed the low dose of radioactivity involved in the procedure. Informed, written consent was given. The usual time-out protocol was performed immediately prior to the procedure. Using mammographic guidance, sterile technique, 1% lidocaine and an I-125 radioactive seed, the coil shaped clip and associated microcalcifications was localized using a superior approach. The follow-up mammogram images confirm the seed in the expected location and were marked for Dr. [name]. Follow-up survey of the patient confirms presence of the radioactive seed. Order number of I-125 seed:  [id number]. Total activity:  5.615 millicuries reference Date: 01/20/2022 The patient tolerated the procedure well and was released from the [facility]. She was given instructions regarding seed removal. IMPRESSION: Radioactive seed localization right breast. No apparent complications. Suspicious calcifications are seen extending approximately 1.1 cm posterior to the coil shaped  clip with associated radioactive seed, extending approximately 1 cm posterior-lateral to the clip/seed and approximately 0.6 cm posterior-medial to the clip/seed. Electronically Signed: By: Franki Cabot M.D. On: 03/21/2022 09:54      IMPRESSION: Stage 0 (cTis (DCIS), cN0, cM0) Right Breast, Intermediate grade ductal carcinoma in-situ, ER+ / PR+ / Her2 not assessed  ***  Today, I talked to the patient and family about the findings and work-up thus far.  We discussed the natural history of *** and general treatment, highlighting the role of radiotherapy in the management.  We discussed the available radiation techniques, and focused on the details of logistics and delivery.  We reviewed the anticipated acute and late sequelae associated with radiation in this setting.  The patient was encouraged to ask questions that I answered to the best of my ability. *** A patient consent form was discussed and signed.  We retained a copy for our records.  The patient would like to proceed with radiation and will be scheduled for CT simulation.  PLAN: ***    *** minutes of total time was spent for this patient encounter, including preparation, face-to-face counseling with the patient and coordination of care, physical exam, and documentation of the encounter.   ------------------------------------------------  Blair Promise, PhD, MD  This document serves as a record of services personally performed by Gery Pray, MD. It was created on his behalf by Roney Mans, a trained medical scribe. The creation of this record is based on the scribe's personal observations and the provider's statements to them. This document has been checked and approved by the attending provider.

## 2022-04-12 ENCOUNTER — Ambulatory Visit
Admission: RE | Admit: 2022-04-12 | Discharge: 2022-04-12 | Disposition: A | Discharge status: Home / Self Care | Payer: No Typology Code available for payment source | Source: Ambulatory Visit | Attending: Radiation Oncology | Admitting: Radiation Oncology

## 2022-04-12 ENCOUNTER — Encounter: Payer: Self-pay | Admitting: Radiation Oncology

## 2022-04-12 VITALS — BP 134/66 | HR 81 | Temp 97.6°F | Resp 18 | Ht 68.0 in | Wt 178.4 lb

## 2022-04-12 VITALS — BP 134/66 | HR 81 | Temp 97.6°F | Resp 18 | Ht 68.0 in | Wt 178.2 lb

## 2022-04-12 DIAGNOSIS — M810 Age-related osteoporosis without current pathological fracture: Secondary | ICD-10-CM | POA: Insufficient documentation

## 2022-04-12 DIAGNOSIS — Z85048 Personal history of other malignant neoplasm of rectum, rectosigmoid junction, and anus: Secondary | ICD-10-CM | POA: Diagnosis not present

## 2022-04-12 DIAGNOSIS — Z79899 Other long term (current) drug therapy: Secondary | ICD-10-CM | POA: Insufficient documentation

## 2022-04-12 DIAGNOSIS — E785 Hyperlipidemia, unspecified: Secondary | ICD-10-CM | POA: Insufficient documentation

## 2022-04-12 DIAGNOSIS — E7800 Pure hypercholesterolemia, unspecified: Secondary | ICD-10-CM | POA: Insufficient documentation

## 2022-04-12 DIAGNOSIS — Z170 Estrogen receptor positive status [ER+]: Secondary | ICD-10-CM | POA: Diagnosis not present

## 2022-04-12 DIAGNOSIS — D0511 Intraductal carcinoma in situ of right breast: Secondary | ICD-10-CM

## 2022-04-12 DIAGNOSIS — D0512 Intraductal carcinoma in situ of left breast: Secondary | ICD-10-CM | POA: Insufficient documentation

## 2022-04-12 DIAGNOSIS — Z85118 Personal history of other malignant neoplasm of bronchus and lung: Secondary | ICD-10-CM | POA: Diagnosis not present

## 2022-04-12 DIAGNOSIS — Z79811 Long term (current) use of aromatase inhibitors: Secondary | ICD-10-CM | POA: Diagnosis not present

## 2022-04-12 DIAGNOSIS — R918 Other nonspecific abnormal finding of lung field: Secondary | ICD-10-CM | POA: Insufficient documentation

## 2022-04-12 DIAGNOSIS — Z87891 Personal history of nicotine dependence: Secondary | ICD-10-CM | POA: Diagnosis not present

## 2022-04-13 ENCOUNTER — Ambulatory Visit (INDEPENDENT_AMBULATORY_CARE_PROVIDER_SITE_OTHER): Payer: No Typology Code available for payment source | Admitting: Family Medicine

## 2022-04-13 ENCOUNTER — Ambulatory Visit: Payer: Self-pay

## 2022-04-13 VITALS — BP 120/76 | HR 70 | Ht 68.0 in | Wt 179.0 lb

## 2022-04-13 DIAGNOSIS — M25511 Pain in right shoulder: Secondary | ICD-10-CM | POA: Diagnosis not present

## 2022-04-13 DIAGNOSIS — G8929 Other chronic pain: Secondary | ICD-10-CM

## 2022-04-13 NOTE — Patient Instructions (Signed)
Thank you for coming in today.   Call or go to the ER if you develop a large red swollen joint with extreme pain or oozing puss.    Let me know if this does not work. We can do the other injection sooner if we need to.

## 2022-04-13 NOTE — Progress Notes (Signed)
   I, [name], LAT, ATC acting as a scribe for [name], MD.  Kelli [name] is a 76 y.o. female who presents to [facility] today for right shoulder pain.  Of note, patient underwent a right breast lumpectomy on 03/22/2022.  Patient was last seen by Dr. [name] on 01/10/2022 and was given a right subacromial steroid injection and was advised to continue HEP.  Today, patient reports prior R steroid injection only lasted about 1 month.  Pt c/o R shoulder pain is not radiating through R upper arm to elbow.   Dx imaging: 07/26/20 R shoulder MRI             11/21/19 R shoulder XR Pertinent review of systems: No fevers or chills  Relevant historical information: Right breast cancer DCIS requiring lumpectomy and anastrozole.  Ultimately radiation therapy was not recommended.   Exam:  BP 120/76   Pulse 70   Ht 5\' 8"  (1.727 m)   Wt 179 lb (81.2 kg)   LMP  (LMP Unknown)   SpO2 96%   BMI 27.22 kg/m  General: Well Developed, well nourished, and in no acute distress.   MSK: Right shoulder normal appearing decreased range of motion pain with abduction.  Intact strength.    Lab and Radiology Results  Procedure: Real-time Ultrasound Guided Injection of right shoulder glenohumeral joint posterior approach Device: Philips Affiniti 50G Images permanently stored and available for review in PACS Verbal informed consent obtained.  Discussed risks and benefits of procedure. Warned about infection, bleeding, hyperglycemia damage to structures among others. Patient expresses understanding and agreement Time-out conducted.   Noted no overlying erythema, induration, or other signs of local infection.   Skin prepped in a sterile fashion.   Local anesthesia: Topical Ethyl chloride.   With sterile technique and under real time ultrasound guidance: 40 mg of Kenalog and 2 mm of Marcaine injected into glenohumeral joint. Fluid seen entering the joint capsule.   Completed  without difficulty   Pain immediately resolved suggesting accurate placement of the medication.   Advised to call if fevers/chills, erythema, induration, drainage, or persistent bleeding.   Images permanently stored and available for review in the ultrasound unit.  Impression: Technically successful ultrasound guided injection.         Assessment and Plan: 76 y.o. female with right shoulder pain thought to be due to chronic rotator cuff tear, tendinitis.  She had a subacromial injection most recently in early August which only lasted for about a month.  We will try a glenohumeral injection today as it may be more effective.  If that does not work very well we can go right back to the subacromial injections.   PDMP not reviewed this encounter. Orders Placed This Encounter  Procedures   [place] LIMITED JOINT SPACE STRUCTURES UP RIGHT(NO LINKED CHARGES)    Order Specific Question:   Reason for Exam (SYMPTOM  OR DIAGNOSIS REQUIRED)    Answer:   right shoulder pain    Order Specific Question:   Preferred imaging location?    Answer:   [facility]   No orders of the defined types were placed in this encounter.    Discussed warning signs or symptoms. Please see discharge instructions. Patient expresses understanding.   The above documentation has been reviewed and is accurate and complete [name], M.D.

## 2022-04-13 NOTE — Patient Instructions (Signed)
Thank you for coming in today.   You received an injection today. Seek immediate medical attention if the joint becomes red, extremely painful, or is oozing fluid.

## 2022-04-14 ENCOUNTER — Other Ambulatory Visit: Payer: Self-pay | Admitting: Family Medicine

## 2022-05-15 ENCOUNTER — Other Ambulatory Visit: Payer: Self-pay

## 2022-05-15 ENCOUNTER — Inpatient Hospital Stay: Payer: No Typology Code available for payment source

## 2022-05-15 ENCOUNTER — Encounter: Payer: Self-pay | Admitting: Licensed Clinical Social Worker

## 2022-05-15 ENCOUNTER — Other Ambulatory Visit: Payer: Self-pay | Admitting: Licensed Clinical Social Worker

## 2022-05-15 ENCOUNTER — Inpatient Hospital Stay: Payer: No Typology Code available for payment source | Attending: Oncology | Admitting: Licensed Clinical Social Worker

## 2022-05-15 DIAGNOSIS — D0511 Intraductal carcinoma in situ of right breast: Secondary | ICD-10-CM

## 2022-05-15 DIAGNOSIS — Z803 Family history of malignant neoplasm of breast: Secondary | ICD-10-CM

## 2022-05-15 DIAGNOSIS — Z85118 Personal history of other malignant neoplasm of bronchus and lung: Secondary | ICD-10-CM | POA: Diagnosis not present

## 2022-05-15 DIAGNOSIS — Z85038 Personal history of other malignant neoplasm of large intestine: Secondary | ICD-10-CM

## 2022-05-15 DIAGNOSIS — Z801 Family history of malignant neoplasm of trachea, bronchus and lung: Secondary | ICD-10-CM | POA: Diagnosis not present

## 2022-05-15 LAB — GENETIC SCREENING ORDER

## 2022-05-15 NOTE — Progress Notes (Signed)
REFERRING PROVIDER: [name], MD [address] [place],  [place] [address]  PRIMARY PROVIDER:  Luetta Nutting, DO  PRIMARY REASON FOR VISIT:  1. Ductal carcinoma in situ (DCIS) of right breast   2. History of lung cancer   3. H/O colon cancer, stage II      HISTORY OF PRESENT ILLNESS:   Ms. Allbaugh, a 76 y.o. female, was seen for a [facility] cancer genetics consultation at the request of Dr. [name] due to a personal history of multiple cancers.  Ms. Wojtaszek presents to clinic today to discuss the possibility of a hereditary predisposition to cancer, genetic testing, and to further clarify her future cancer risks, as well as potential cancer risks for family members.   CANCER HISTORY:  In 2010, at the age of 67, Ms. Cannady was diagnosed with colon cancer, treated with surgery and adjuvant chemotherapy. She has had several colonoscopies since, several polyps. She had lung cancer in 2016, treated with surgery. She had right DCIS diagnosed at 42, treated with lumpectomy in October 2023 and adjuvant endocrine therapy.  RISK FACTORS:  Menarche was at age 80.  OCP use: yes, unknown amount of time. Ovaries intact: yes.  Hysterectomy: no.  Menopausal status: postmenopausal.  Colonoscopy: yes;  most recent in 2021 found a few polyps . Mammogram within the last year: yes.  Past Medical History:  Diagnosis Date   Anxiety    Arthritis    Borderline diabetic    Colon cancer ([place])    colon ca dx 02/2009, lung cancer   Depression    Depression with anxiety 09/19/2011   H/O colon cancer, stage II 09/19/2011   T3N0  [facility] Sept 2010   High cholesterol    Hyperlipidemia 09/19/2011   Lung nodules 09/19/2011   granulomas   Osteoporosis 2022   Urinary urgency     Past Surgical History:  Procedure Laterality Date   APPENDECTOMY     AUGMENTATION MAMMAPLASTY     BREAST LUMPECTOMY WITH RADIOACTIVE SEED LOCALIZATION Right 03/22/2022   Procedure: RIGHT BREAST LUMPECTOMY WITH  RADIOACTIVE SEED LOCALIZATION;  Surgeon: [name], MD;  Location: [facility];  Service: General;  Laterality: Right;   BREAST SURGERY  1978   augmentation   COLON SURGERY     2010   CRYO INTERCOSTAL NERVE BLOCK N/A 10/05/2014   Procedure: CRYO INTERCOSTAL NERVE BLOCK;  Surgeon: [name], MD;  Location: [facility];  Service: Thoracic;  Laterality: N/A;   LYMPH NODE DISSECTION Right 10/05/2014   Procedure: LYMPH NODE DISSECTION;  Surgeon: [name], MD;  Location: [facility];  Service: Thoracic;  Laterality: Right;   ORIF HUMERUS FRACTURE Left 10/01/2018   Procedure: OPEN REDUCTION INTERNAL FIXATION (ORIF) LEFT PROXIMAL HUMERUS FRACTURE;  Surgeon: [name], MD;  Location: [facility];  Service: Orthopedics;  Laterality: Left;   PARTIAL COLECTOMY Right 2010   SEGMENTECOMY Right 10/05/2014   Procedure: right lower lobe SEGMENTECTOMY;  Surgeon: [name], MD;  Location: [facility];  Service: Thoracic;  Laterality: Right;   VIDEO ASSISTED THORACOSCOPY (VATS)/WEDGE RESECTION Right 10/05/2014   Procedure: Right VIDEO ASSISTED THORACOSCOPY (VATS) with right lower lobe lung nodule WEDGE RESECTION;  Surgeon: [name], MD;  Location: [facility];  Service: Thoracic;  Laterality: Right;    FAMILY HISTORY:  We obtained a detailed, 4-generation family history.  Significant diagnoses are listed below: Family History  Problem Relation Age of Onset   Heart disease Mother  Colon polyps Sister    Heart disease Brother    Cancer Brother    Colon cancer Neg Hx    Esophageal cancer Neg Hx    Stomach cancer Neg Hx    Rectal cancer Neg Hx    Ms. Eber had 2 brothers and 3 sisters. One brother died of metastatic cancer at 24, unknown primary. A sister had lung cancer and is living in her 14s. Her other brother had 3 daughters, two had breast cancer and the third had genetic testing that was positive, possibly for BRCA mutation. Of note, their mother had  pancreatic cancer.   Ms. Mccullum mother died at 64 and father died at 82. No other known cancers in the family.  Ms. Mcclaran is unaware of previous family history of genetic testing for hereditary cancer risks. There is no reported Ashkenazi Jewish ancestry. There is no known consanguinity.  GENETIC COUNSELING ASSESSMENT: Ms. Crowl is a 76 y.o. female with a personal history of multiple cancers which is somewhat suggestive of a hereditary cancer syndrome and predisposition to cancer. We, therefore, discussed and recommended the following at today's visit.   DISCUSSION: We discussed that approximately 10% of breast cancer is hereditary. Most cases of hereditary breast cancer are associated with BRCA1/BRCA2 genes, although there are other genes associated with hereditary cancer as well, including genes like CHEK2 which also increases the risk for colon cancer. The gene mutation found in her niece may be found in her, if this mutation came from patient's brother. Cancers and risks are gene specific. We discussed that testing is beneficial for several reasons including knowing about cancer risks, identifying potential screening and risk-reduction options that may be appropriate, and to understand if other family members could be at risk for cancer and allow them to undergo genetic testing.   We reviewed the characteristics, features and inheritance patterns of hereditary cancer syndromes. We also discussed genetic testing, including the appropriate family members to test, the process of testing, insurance coverage and turn-around-time for results. We discussed the implications of a negative, positive and/or variant of uncertain significant result. We recommended Ms. Saraceni pursue genetic testing for the [facility] CancerNext-Expanded+RNA gene panel.   Based on Ms. Hammersmith's personal and family history of cancer, she meets medical criteria for genetic testing. Despite that she meets criteria, she may still have an  out of pocket cost. We discussed that if her out of pocket cost for testing is over $100, the laboratory will call and confirm whether she wants to proceed with testing.  If the out of pocket cost of testing is less than $100 she will be billed by the genetic testing laboratory.    PLAN: After considering the risks, benefits, and limitations, Ms. Menon provided informed consent to pursue genetic testing and the blood sample was sent to [facility] for analysis of the CancerNext-Expanded+RNA panel. Results should be available within approximately 2-3 weeks' time, at which point they will be disclosed by telephone to Ms. Roker, as will any additional recommendations warranted by these results. Ms. Harriss will receive a summary of her genetic counseling visit and a copy of her results once available. This information will also be available in Epic.   Ms. Pamer questions were answered to her satisfaction today. Our contact information was provided should additional questions or concerns arise. Thank you for the referral and allowing [place] to share in the care of your patient.   Faith Rogue, MS, [facility] Genetic Counselor [email or web address] Phone: [phone]  The patient  was seen for a total of 30 minutes in face-to-face genetic counseling.  Dr. Grayland Ormond was available for discussion regarding this case.   _______________________________________________________________________ For Office Staff:  Number of people involved in session: 1 Was an Intern/ student involved with case: no

## 2022-05-18 ENCOUNTER — Ambulatory Visit (INDEPENDENT_AMBULATORY_CARE_PROVIDER_SITE_OTHER): Payer: No Typology Code available for payment source | Admitting: Family Medicine

## 2022-05-18 ENCOUNTER — Encounter: Payer: Self-pay | Admitting: Family Medicine

## 2022-05-18 VITALS — BP 118/69 | HR 76 | Ht 68.0 in | Wt 179.0 lb

## 2022-05-18 DIAGNOSIS — F418 Other specified anxiety disorders: Secondary | ICD-10-CM

## 2022-05-18 DIAGNOSIS — E119 Type 2 diabetes mellitus without complications: Secondary | ICD-10-CM

## 2022-05-18 DIAGNOSIS — Z170 Estrogen receptor positive status [ER+]: Secondary | ICD-10-CM

## 2022-05-18 DIAGNOSIS — E785 Hyperlipidemia, unspecified: Secondary | ICD-10-CM

## 2022-05-18 DIAGNOSIS — C50411 Malignant neoplasm of upper-outer quadrant of right female breast: Secondary | ICD-10-CM | POA: Diagnosis not present

## 2022-05-18 DIAGNOSIS — Z794 Long term (current) use of insulin: Secondary | ICD-10-CM

## 2022-05-18 LAB — POCT GLYCOSYLATED HEMOGLOBIN (HGB A1C): HbA1c, POC (controlled diabetic range): 7.5 % — AB (ref 0.0–7.0)

## 2022-05-18 NOTE — Assessment & Plan Note (Signed)
Tolerating pravastatin well at current strength.

## 2022-05-18 NOTE — Assessment & Plan Note (Signed)
Continue fluoxetine at current strength.

## 2022-05-18 NOTE — Progress Notes (Signed)
Kelli [name] - 76 y.o. female MRN [id number]  Date of birth: Jul 26, 1945  Subjective Chief Complaint  Patient presents with   Cyst    HPI Kelli [name] is a 76 year old female here today for follow-up visit.  Since her last visit she was diagnosed with right-sided breast cancer.  She underwent right breast lumpectomy on 08/21/2021.  Pathology with DCIS.  Negative margins.  She has done well since this time.  Genetic screening has been ordered due to her history of prior malignancies.  She continues on Basaglar for management of type 2 diabetes.  She admits that diet could be better.  She denies symptoms related to her diabetes at this time.  She does not monitor her blood sugars at home regularly.  She has been on pravastatin for associated hyperlipidemia.  Tolerating this well.   ROS:  A comprehensive ROS was completed and negative except as noted per HPI   No Known Allergies  Past Medical History:  Diagnosis Date   Anxiety    Arthritis    Borderline diabetic    Colon cancer ([place])    colon ca dx 02/2009, lung cancer   Depression    Depression with anxiety 09/19/2011   Diabetes mellitus type 2, uncomplicated ([place]) 48/88/9169   H/O colon cancer, stage II 09/19/2011   T3N0  Cecum Sept 2010   High cholesterol    Hyperlipidemia 09/19/2011   Lung nodules 09/19/2011   granulomas   Osteoporosis 2022   Urinary urgency     Past Surgical History:  Procedure Laterality Date   APPENDECTOMY     AUGMENTATION MAMMAPLASTY     BREAST LUMPECTOMY WITH RADIOACTIVE SEED LOCALIZATION Right 03/22/2022   Procedure: RIGHT BREAST LUMPECTOMY WITH RADIOACTIVE SEED LOCALIZATION;  Surgeon: [name], MD;  Location: [facility];  Service: General;  Laterality: Right;   BREAST SURGERY  1978   augmentation   COLON SURGERY     2010   CRYO INTERCOSTAL NERVE BLOCK N/A 10/05/2014   Procedure: CRYO INTERCOSTAL NERVE BLOCK;  Surgeon: [name], MD;  Location: [facility];  Service:  Thoracic;  Laterality: N/A;   LYMPH NODE DISSECTION Right 10/05/2014   Procedure: LYMPH NODE DISSECTION;  Surgeon: [name], MD;  Location: [facility];  Service: Thoracic;  Laterality: Right;   ORIF HUMERUS FRACTURE Left 10/01/2018   Procedure: OPEN REDUCTION INTERNAL FIXATION (ORIF) LEFT PROXIMAL HUMERUS FRACTURE;  Surgeon: [name], MD;  Location: [facility];  Service: Orthopedics;  Laterality: Left;   PARTIAL COLECTOMY Right 2010   SEGMENTECOMY Right 10/05/2014   Procedure: right lower lobe SEGMENTECTOMY;  Surgeon: [name], MD;  Location: [facility] OR;  Service: Thoracic;  Laterality: Right;   VIDEO ASSISTED THORACOSCOPY (VATS)/WEDGE RESECTION Right 10/05/2014   Procedure: Right VIDEO ASSISTED THORACOSCOPY (VATS) with right lower lobe lung nodule WEDGE RESECTION;  Surgeon: [name], MD;  Location: [facility] OR;  Service: Thoracic;  Laterality: Right;    Social History   Socioeconomic History   Marital status: Divorced    Spouse name: Not on file   Number of children: 0   Years of education: 16   Highest education level: Some college, no degree  Occupational History    Comment: Retired.  Tobacco Use   Smoking status: Former    Packs/day: 3.00    Years: 40.00    Total pack years: 120.00    Types: Cigarettes    Start date: 52    Quit date: 02/06/2009    Years  since quitting: 13.2   Smokeless tobacco: Never  Vaping Use   Vaping Use: Never used  Substance and Sexual Activity   Alcohol use: Not Currently    Comment: 1-2 beers every other day   Drug use: Yes    Types: Marijuana    Comment: occasionally   Sexual activity: Not Currently    Birth control/protection: Post-menopausal  Other Topics Concern   Not on file  Social History Narrative   Lives alone. She has been helping take care of her sister, who has Alzheimer's disease.   Social Determinants of Health   Financial Resource Strain: Low Risk  (10/28/2021)   Overall Financial Resource Strain  (CARDIA)    Difficulty of Paying Living Expenses: Not hard at all  Food Insecurity: No Food Insecurity (10/28/2021)   Hunger Vital Sign    Worried About Running Out of Food in the Last Year: Never true    Ran Out of Food in the Last Year: Never true  Transportation Needs: No Transportation Needs (10/28/2021)   PRAPARE - Hydrologist (Medical): No    Lack of Transportation (Non-Medical): No  Physical Activity: Insufficiently Active (10/28/2021)   Exercise Vital Sign    Days of Exercise per Week: 2 days    Minutes of Exercise per Session: 10 min  Stress: No Stress Concern Present (10/28/2021)   [place]    Feeling of Stress : Not at all  Social Connections: Moderately Isolated (11/01/2021)   Social Connection and Isolation Panel [NHANES]    Frequency of Communication with Friends and Family: Three times a week    Frequency of Social Gatherings with Friends and Family: Twice a week    Attends Religious Services: 1 to 4 times per year    Active Member of [employer] or Organizations: No    Attends Archivist Meetings: Never    Marital Status: Divorced    Family History  Problem Relation Age of Onset   Heart disease Mother    Colon polyps Sister    Lung cancer Sister    Heart disease Brother    Cancer Brother        unk type   Breast cancer Niece    Breast cancer Niece    Other Niece        gene +, possibly BRCA   Colon cancer Neg Hx    Esophageal cancer Neg Hx    Stomach cancer Neg Hx    Rectal cancer Neg Hx     Health Maintenance  Topic Date Due   COVID-19 Vaccine (6 - 2023-24 season) 05/25/2022   Zoster Vaccines- Shingrix (2 of 2) 05/25/2022   FOOT EXAM  06/10/2022   Medicare Annual Wellness (AWV)  11/02/2022   DEXA SCAN  11/11/2022   HEMOGLOBIN A1C  11/17/2022   OPHTHALMOLOGY EXAM  11/18/2022   Diabetic kidney evaluation - Urine ACR  02/17/2023   Diabetic kidney  evaluation - eGFR measurement  03/15/2023   MAMMOGRAM  02/18/2024   COLONOSCOPY (Pts 45-73yr Insurance coverage will need to be confirmed)  01/29/2025   DTaP/Tdap/Td (2 - Td or Tdap) 10/01/2028   Pneumonia Vaccine 76 Years old  Completed   INFLUENZA VACCINE  Completed   Hepatitis C Screening  Completed   HPV VACCINES  Aged Out     ----------------------------------------------------------------------------------------------------------------------------------------------------------------------------------------------------------------- Physical Exam BP 118/69 (BP Location: Right Arm, Patient Position: Sitting, Cuff Size: Normal)   Pulse 76  Ht 5' 8" (1.727 m)   Wt 179 lb (81.2 kg)   LMP  (LMP Unknown)   SpO2 98%   BMI 27.22 kg/m   Physical Exam  ------------------------------------------------------------------------------------------------------------------------------------------------------------------------------------------------------------------- Assessment and Plan  Diabetes mellitus type 2, uncomplicated (HCC) O9B has increased some since last visit.  We discussed working on dietary changes to help with management of her blood sugars.  Titrate Basaglar to maintain glucose less than 150.  Depression with anxiety Continue fluoxetine at current strength.  Malignant neoplasm of upper-outer quadrant of right breast in female, estrogen receptor positive ([place]) Status post lumpectomy with DCIS.  She will continue to follow with oncology.  Hyperlipidemia Tolerating pravastatin well at current strength.   No orders of the defined types were placed in this encounter.   Return in about 4 months (around 09/17/2022) for T2Dm.    This visit occurred during the SARS-CoV-2 public health emergency.  Safety protocols were in place, including screening questions prior to the visit, additional usage of staff PPE, and extensive cleaning of exam room while observing appropriate  contact time as indicated for disinfecting solutions.

## 2022-05-18 NOTE — Assessment & Plan Note (Signed)
A1c has increased some since last visit.  We discussed working on dietary changes to help with management of her blood sugars.  Titrate Basaglar to maintain glucose less than 150.

## 2022-05-18 NOTE — Assessment & Plan Note (Signed)
Status post lumpectomy with DCIS.  She will continue to follow with oncology.

## 2022-06-06 ENCOUNTER — Encounter: Payer: Self-pay | Admitting: Family Medicine

## 2022-06-06 ENCOUNTER — Telehealth: Payer: Self-pay | Admitting: Licensed Clinical Social Worker

## 2022-06-06 ENCOUNTER — Encounter: Payer: Self-pay | Admitting: Licensed Clinical Social Worker

## 2022-06-06 ENCOUNTER — Ambulatory Visit: Payer: Self-pay | Admitting: Licensed Clinical Social Worker

## 2022-06-06 DIAGNOSIS — Z1379 Encounter for other screening for genetic and chromosomal anomalies: Secondary | ICD-10-CM

## 2022-06-06 HISTORY — DX: Encounter for other screening for genetic and chromosomal anomalies: Z13.79

## 2022-06-06 NOTE — Telephone Encounter (Signed)
I contacted Ms. Tugwell to discuss her genetic testing results. No pathogenic variants were identified in the 77 genes analyzed. Detailed clinic note to follow.   The test report has been scanned into EPIC and is located under the Molecular Pathology section of the Results Review tab.  A portion of the result report is included below for reference.      Faith Rogue, MS, [facility] Genetic Counselor [email or web address] Phone: [phone]

## 2022-06-06 NOTE — Progress Notes (Signed)
HPI:   Ms. Ocampo was previously seen in the [facility] clinic due to a personal and family history of cancer and concerns regarding a hereditary predisposition to cancer. Please refer to our prior cancer genetics clinic note for more information regarding our discussion, assessment and recommendations, at the time. Ms. Leifheit recent genetic test results were disclosed to her, as were recommendations warranted by these results. These results and recommendations are discussed in more detail below.  CANCER HISTORY:  Oncology History   No history exists.    FAMILY HISTORY:  We obtained a detailed, 4-generation family history.  Significant diagnoses are listed below: Family History  Problem Relation Age of Onset   Heart disease Mother    Colon polyps Sister    Lung cancer Sister    Heart disease Brother    Cancer Brother        unk type   Breast cancer Niece    Breast cancer Niece    Other Niece        gene +, possibly BRCA   Colon cancer Neg Hx    Esophageal cancer Neg Hx    Stomach cancer Neg Hx    Rectal cancer Neg Hx     Ms. Bubb had 2 brothers and 3 sisters. One brother died of metastatic cancer at 27, unknown primary. A sister had lung cancer and is living in her 87s. Her other brother had 3 daughters, two had breast cancer and the third had genetic testing that was positive, possibly for BRCA mutation. Of note, their mother had pancreatic cancer.    Ms. Wible mother died at 70 and father died at 17. No other known cancers in the family.   Ms. Crean is unaware of previous family history of genetic testing for hereditary cancer risks. There is no reported Ashkenazi Jewish ancestry. There is no known consanguinity.  GENETIC TEST RESULTS:  The [facility] CancerNext-Expanded+RNA Panel found no pathogenic mutations.   The CancerNext-Expanded + RNAinsight gene panel offered by [employer] and includes sequencing and rearrangement analysis for the following 77  genes: IP, ALK, APC*, ATM*, AXIN2, BAP1, BARD1, BLM, BMPR1A, BRCA1*, BRCA2*, BRIP1*, CDC73, CDH1*,CDK4, CDKN1B, CDKN2A, CHEK2*, CTNNA1, DICER1, FANCC, FH, FLCN, GALNT12, KIF1B, LZTR1, MAX, MEN1, MET, MLH1*, MSH2*, MSH3, MSH6*, MUTYH*, NBN, NF1*, NF2, NTHL1, PALB2*, PHOX2B, PMS2*, POT1, PRKAR1A, PTCH1, PTEN*, RAD51C*, RAD51D*,RB1, RECQL, RET, SDHA, SDHAF2, SDHB, SDHC, SDHD, SMAD4, SMARCA4, SMARCB1, SMARCE1, STK11, SUFU, TMEM127, TP53*,TSC1, TSC2, VHL and XRCC2 (sequencing and deletion/duplication); EGFR, EGLN1, HOXB13, KIT, MITF, PDGFRA, POLD1 and POLE (sequencing only); EPCAM and GREM1 (deletion/duplication only).   The test report has been scanned into EPIC and is located under the Molecular Pathology section of the Results Review tab.  A portion of the result report is included below for reference. Genetic testing reported out on 06/06/2022.     Even though a pathogenic variant was not identified, possible explanations for the cancer in the family may include: There may be no hereditary risk for cancer in the family. The cancers in Ms. Zufall and/or her family may be sporadic/familial or due to other genetic and environmental factors. There may be a gene mutation in one of these genes that current testing methods cannot detect but that chance is small. There could be another gene that has not yet been discovered, or that we have not yet tested, that is responsible for the cancer diagnoses in the family.  It is also possible there is a hereditary cause for the cancer in  the family that Ms. Gilcrest did not inherit. Therefore, it is important to remain in touch with cancer genetics in the future so that we can continue to offer Ms. Vasco the most up to date genetic testing.   ADDITIONAL GENETIC TESTING:  We discussed with Ms. Sicard that her genetic testing was fairly extensive.  If there are additional relevant genes identified to increase cancer risk that can be analyzed in the future, we would be happy  to discuss and coordinate this testing at that time.    CANCER SCREENING RECOMMENDATIONS:  Ms. Towles test result is considered negative (normal).  This means that we have not identified a hereditary cause for her personal and family history of cancer at this time. \  An individual's cancer risk and medical management are not determined by genetic test results alone. Overall cancer risk assessment incorporates additional factors, including personal medical history, family history, and any available genetic information that may result in a personalized plan for cancer prevention and surveillance. Therefore, it is recommended she continue to follow the cancer management and screening guidelines provided by her oncology and primary healthcare provider.  RECOMMENDATIONS FOR FAMILY MEMBERS:   Individuals in this family might be at some increased risk of developing cancer, over the general population risk, due to the family history of cancer.  Individuals in the family should notify their providers of the family history of cancer. We recommend women in this family have a yearly mammogram beginning at age 61, or 50 years younger than the earliest onset of cancer, an annual clinical breast exam, and perform monthly breast self-exams.  Family members should have colonoscopies by at age 6, or earlier, as recommended by their providers. Other members of the family may still carry a pathogenic variant in one of these genes that Ms. Clippard did not inherit. Based on the family history, we recommend those related to her niece who reportedkly tested positive for a gene mutation have genetic counseling and testing. Ms. Weltman will let us know if we can be of any assistance in coordinating genetic counseling and/or testing for this family member.    FOLLOW-UP:  Lastly, we discussed with Ms. Linney that cancer genetics is a rapidly advancing field and it is possible that new genetic tests will be appropriate for her  and/or her family members in the future. We encouraged her to remain in contact with cancer genetics on an annual basis so we can update her personal and family histories and let her know of advances in cancer genetics that may benefit this family.   Our contact number was provided. Ms. Winters questions were answered to her satisfaction, and she knows she is welcome to call us at anytime with additional questions or concerns.    Faith Rogue, MS, [facility] Genetic Counselor [email or web address] Phone: [phone]

## 2022-06-11 ENCOUNTER — Other Ambulatory Visit: Payer: Self-pay | Admitting: Family Medicine

## 2022-06-11 DIAGNOSIS — M19041 Primary osteoarthritis, right hand: Secondary | ICD-10-CM

## 2022-06-11 DIAGNOSIS — F418 Other specified anxiety disorders: Secondary | ICD-10-CM

## 2022-06-20 ENCOUNTER — Encounter: Payer: Self-pay | Admitting: Family Medicine

## 2022-06-20 ENCOUNTER — Ambulatory Visit: Payer: No Typology Code available for payment source | Admitting: Family Medicine

## 2022-06-20 VITALS — BP 138/71 | HR 73 | Ht 68.0 in | Wt 181.0 lb

## 2022-06-20 DIAGNOSIS — L723 Sebaceous cyst: Secondary | ICD-10-CM | POA: Diagnosis not present

## 2022-06-20 MED ORDER — DOXYCYCLINE HYCLATE 100 MG PO TABS: 100.0000 mg | ORAL_TABLET | Freq: Two times a day (BID) | ORAL | 0 refills | Status: DC

## 2022-06-20 NOTE — Assessment & Plan Note (Addendum)
Cyst removed today.  Due to residual inflammation and purulent material expressed we will add a course of doxycycline as well.  Instructed to contact clinic if having significantly worsening pain or drainage from the area.  I did discuss possibility of recurrence due to possible retained capsule material.

## 2022-06-20 NOTE — Progress Notes (Signed)
Kelli [name] - 77 y.o. female MRN [id number]  Date of birth: 05-25-1946  Subjective Chief Complaint  Patient presents with   Cyst    HPI Kelli [name] is a 77 y.o. female here today to discuss cyst removal.    Cyst located on the upper back.  She had had some increased irritation and inflammation recently.  Area was tender to touch.  It has improved but still has some tenderness.  She would like to proceed with having this removed today.  She denies fever or chills.    ROS:  A comprehensive ROS was completed and negative except as noted per HPI  No Known Allergies  Past Medical History:  Diagnosis Date   Anxiety    Arthritis    Borderline diabetic    Colon cancer (HCC)    colon ca dx 02/2009, lung cancer   Depression    Depression with anxiety 09/19/2011   Diabetes mellitus type 2, uncomplicated (HCC) 12/31/2014   Genetic testing 06/06/2022   H/O colon cancer, stage II 09/19/2011   T3N0  Cecum Sept 2010   High cholesterol    Hyperlipidemia 09/19/2011   Lung nodules 09/19/2011   granulomas   Osteoporosis 2022   Urinary urgency     Past Surgical History:  Procedure Laterality Date   APPENDECTOMY     AUGMENTATION MAMMAPLASTY     BREAST LUMPECTOMY WITH RADIOACTIVE SEED LOCALIZATION Right 03/22/2022   Procedure: RIGHT BREAST LUMPECTOMY WITH RADIOACTIVE SEED LOCALIZATION;  Surgeon: Almond Lint, MD;  Location: [facility];  Service: General;  Laterality: Right;   BREAST SURGERY  1978   augmentation   COLON SURGERY     2010   CRYO INTERCOSTAL NERVE BLOCK N/A 10/05/2014   Procedure: CRYO INTERCOSTAL NERVE BLOCK;  Surgeon: Loreli Slot, MD;  Location: [facility] OR;  Service: Thoracic;  Laterality: N/A;   LYMPH NODE DISSECTION Right 10/05/2014   Procedure: LYMPH NODE DISSECTION;  Surgeon: Loreli Slot, MD;  Location: [facility] OR;  Service: Thoracic;  Laterality: Right;   ORIF HUMERUS FRACTURE Left 10/01/2018   Procedure: OPEN REDUCTION INTERNAL FIXATION  (ORIF) LEFT PROXIMAL HUMERUS FRACTURE;  Surgeon: Cammy Copa, MD;  Location: [facility] OR;  Service: Orthopedics;  Laterality: Left;   PARTIAL COLECTOMY Right 2010   SEGMENTECOMY Right 10/05/2014   Procedure: right lower lobe SEGMENTECTOMY;  Surgeon: Loreli Slot, MD;  Location: [facility] OR;  Service: Thoracic;  Laterality: Right;   VIDEO ASSISTED THORACOSCOPY (VATS)/WEDGE RESECTION Right 10/05/2014   Procedure: Right VIDEO ASSISTED THORACOSCOPY (VATS) with right lower lobe lung nodule WEDGE RESECTION;  Surgeon: Loreli Slot, MD;  Location: [facility] OR;  Service: Thoracic;  Laterality: Right;    Social History   Socioeconomic History   Marital status: Divorced    Spouse name: Not on file   Number of children: 0   Years of education: 16   Highest education level: Some college, no degree  Occupational History    Comment: Retired.  Tobacco Use   Smoking status: Former    Packs/day: 3.00    Years: 40.00    Total pack years: 120.00    Types: Cigarettes    Start date: 39    Quit date: 02/06/2009    Years since quitting: 13.3   Smokeless tobacco: Never  Vaping Use   Vaping Use: Never used  Substance and Sexual Activity   Alcohol use: Not Currently    Comment: 1-2 beers every other day   Drug use: Yes  Types: Marijuana    Comment: occasionally   Sexual activity: Not Currently    Birth control/protection: Post-menopausal  Other Topics Concern   Not on file  Social History Narrative   Lives alone. She has been helping take care of her sister, who has Alzheimer's disease.   Social Determinants of Health   Financial Resource Strain: Low Risk  (10/28/2021)   Overall Financial Resource Strain (CARDIA)    Difficulty of Paying Living Expenses: Not hard at all  Food Insecurity: No Food Insecurity (10/28/2021)   Hunger Vital Sign    Worried About Running Out of Food in the Last Year: Never true    Ran Out of Food in the Last Year: Never true  Transportation Needs: No  Transportation Needs (10/28/2021)   PRAPARE - Administrator, Civil Service (Medical): No    Lack of Transportation (Non-Medical): No  Physical Activity: Insufficiently Active (10/28/2021)   Exercise Vital Sign    Days of Exercise per Week: 2 days    Minutes of Exercise per Session: 10 min  Stress: No Stress Concern Present (10/28/2021)   [employer] of Occupational Health - Occupational Stress Questionnaire    Feeling of Stress : Not at all  Social Connections: Moderately Isolated (11/01/2021)   Social Connection and Isolation Panel [NHANES]    Frequency of Communication with Friends and Family: Three times a week    Frequency of Social Gatherings with Friends and Family: Twice a week    Attends Religious Services: 1 to 4 times per year    Active Member of Clubs or Organizations: No    Attends Banker Meetings: Never    Marital Status: Divorced    Family History  Problem Relation Age of Onset   Heart disease Mother    Colon polyps Sister    Lung cancer Sister    Heart disease Brother    Cancer Brother        unk type   Breast cancer Niece    Breast cancer Niece    Other Niece        gene +, possibly BRCA   Colon cancer Neg Hx    Esophageal cancer Neg Hx    Stomach cancer Neg Hx    Rectal cancer Neg Hx     Health Maintenance  Topic Date Due   FOOT EXAM  06/10/2022   Zoster Vaccines- Shingrix (2 of 2) 09/19/2022 (Originally 05/25/2022)   COVID-19 Vaccine (6 - 2023-24 season) 07/07/2023 (Originally 05/25/2022)   Medicare Annual Wellness (AWV)  11/02/2022   DEXA SCAN  11/11/2022   HEMOGLOBIN A1C  11/17/2022   OPHTHALMOLOGY EXAM  11/18/2022   Diabetic kidney evaluation - Urine ACR  02/17/2023   Diabetic kidney evaluation - eGFR measurement  03/15/2023   MAMMOGRAM  02/18/2024   COLONOSCOPY (Pts 45-54yrs Insurance coverage will need to be confirmed)  01/29/2025   DTaP/Tdap/Td (2 - Td or Tdap) 10/01/2028   Pneumonia Vaccine 64+ Years old   Completed   INFLUENZA VACCINE  Completed   Hepatitis C Screening  Completed   HPV VACCINES  Aged Out     ----------------------------------------------------------------------------------------------------------------------------------------------------------------------------------------------------------------- Physical Exam BP 138/71 (BP Location: Left Arm, Patient Position: Sitting, Cuff Size: Normal)   Pulse 73   Ht 5\' 8"  (1.727 m)   Wt 181 lb (82.1 kg)   LMP  (LMP Unknown)   SpO2 95%   BMI 27.52 kg/m   Physical Exam Constitutional:      Appearance: Normal appearance.  HENT:     Head: Normocephalic and atraumatic.  Skin:    Comments: Cystic lesion located adjacent to the right shoulder blade.  It is approximately 1.5 cm in diameter.  Neurological:     Mental Status: She is alert.    Procedure note: Patient prepped in typical sterile fashion using chlorhexidine.  Anesthesia achieved using 4 mL 1% lidocaine.  After adequate anesthesia a small incision was made overlying the cyst.  The capsule of the cyst was already interrupted and liquid, purulent material was expressed from the cyst.  Blunt dissection was used to reach capsule.  Due to residual inflammation the capsule was quite friable and had to be removed in pieces.  Area was explored and no visible pieces of capsule seen.  Bleeding controlled with localized pressure and Steri-Strips applied.  She tolerated procedure well.  Postprocedure instructions were given. ------------------------------------------------------------------------------------------------------------------------------------------------------------------------------------------------------------------- Assessment and Plan  Sebaceous cyst Cyst removed today.  Due to residual inflammation and purulent material expressed we will add a course of doxycycline as well.  Instructed to contact clinic if having significantly worsening pain or drainage from the area.   I did discuss possibility of recurrence due to possible retained capsule material.   Meds ordered this encounter  Medications   doxycycline (VIBRA-TABS) 100 MG tablet    Sig: Take 1 tablet (100 mg total) by mouth 2 (two) times daily.    Dispense:  20 tablet    Refill:  0    No follow-ups on file.    This visit occurred during the SARS-CoV-2 public health emergency.  Safety protocols were in place, including screening questions prior to the visit, additional usage of staff PPE, and extensive cleaning of exam room while observing appropriate contact time as indicated for disinfecting solutions.

## 2022-06-20 NOTE — Patient Instructions (Signed)
Start doxycycline twice per day x10 days.

## 2022-06-22 ENCOUNTER — Encounter (HOSPITAL_COMMUNITY): Payer: Self-pay

## 2022-06-24 ENCOUNTER — Other Ambulatory Visit: Payer: Self-pay | Admitting: Family Medicine

## 2022-06-27 ENCOUNTER — Ambulatory Visit: Payer: No Typology Code available for payment source | Admitting: Family Medicine

## 2022-07-13 ENCOUNTER — Other Ambulatory Visit: Payer: Self-pay

## 2022-07-13 DIAGNOSIS — E785 Hyperlipidemia, unspecified: Secondary | ICD-10-CM

## 2022-07-13 MED ORDER — PRAVASTATIN SODIUM 80 MG PO TABS: 80.0000 mg | ORAL_TABLET | Freq: Every day | ORAL | 3 refills | Status: DC

## 2022-07-19 DIAGNOSIS — H43813 Vitreous degeneration, bilateral: Secondary | ICD-10-CM | POA: Diagnosis not present

## 2022-07-19 DIAGNOSIS — E119 Type 2 diabetes mellitus without complications: Secondary | ICD-10-CM | POA: Diagnosis not present

## 2022-07-19 DIAGNOSIS — H25813 Combined forms of age-related cataract, bilateral: Secondary | ICD-10-CM | POA: Diagnosis not present

## 2022-07-19 DIAGNOSIS — H35361 Drusen (degenerative) of macula, right eye: Secondary | ICD-10-CM | POA: Diagnosis not present

## 2022-07-19 LAB — HM DIABETES EYE EXAM

## 2022-08-16 DIAGNOSIS — H25813 Combined forms of age-related cataract, bilateral: Secondary | ICD-10-CM | POA: Diagnosis not present

## 2022-08-16 DIAGNOSIS — H25812 Combined forms of age-related cataract, left eye: Secondary | ICD-10-CM | POA: Diagnosis not present

## 2022-08-18 ENCOUNTER — Other Ambulatory Visit: Payer: Self-pay

## 2022-08-18 DIAGNOSIS — E785 Hyperlipidemia, unspecified: Secondary | ICD-10-CM

## 2022-08-18 MED ORDER — PRAVASTATIN SODIUM 80 MG PO TABS: 80.0000 mg | ORAL_TABLET | Freq: Every day | ORAL | 3 refills | Status: DC

## 2022-09-05 DIAGNOSIS — H268 Other specified cataract: Secondary | ICD-10-CM | POA: Diagnosis not present

## 2022-09-05 DIAGNOSIS — H269 Unspecified cataract: Secondary | ICD-10-CM | POA: Diagnosis not present

## 2022-09-05 DIAGNOSIS — H25812 Combined forms of age-related cataract, left eye: Secondary | ICD-10-CM | POA: Diagnosis not present

## 2022-09-06 ENCOUNTER — Other Ambulatory Visit: Payer: Self-pay

## 2022-09-06 ENCOUNTER — Ambulatory Visit: Payer: No Typology Code available for payment source | Admitting: Family Medicine

## 2022-09-06 VITALS — BP 158/82 | HR 84 | Ht 68.0 in | Wt 179.0 lb

## 2022-09-06 DIAGNOSIS — R29898 Other symptoms and signs involving the musculoskeletal system: Secondary | ICD-10-CM

## 2022-09-06 DIAGNOSIS — M25511 Pain in right shoulder: Secondary | ICD-10-CM | POA: Diagnosis not present

## 2022-09-06 DIAGNOSIS — G8929 Other chronic pain: Secondary | ICD-10-CM | POA: Diagnosis not present

## 2022-09-06 NOTE — Progress Notes (Signed)
Shirlyn Goltz, PhD, LAT, ATC acting as a scribe for [name], MD.  Kelli [name] is a 77 y.o. female who presents to [place] at [facility] today for exacerbation of her right shoulder pain.  Patient was last seen by Dr. [name] on 04/13/2022 and was given a right [place] steroid injection.  Patient had a right subacromial steroid injection on 01/10/2022.  Today, patient reports R shoulder pain has returned over the last couple weeks. Pt has an upcoming trip to [place], [place].   Pt would like to discuss her family hx of ALS; dad and sister. She notes weakness in both her thighs and knees and says sometimes it will feel like they will "give out" on her.  She thinks her main issue is knee pain and relatively weakness.  Dx imaging: 07/26/20 R shoulder MRI             11/21/19 R shoulder XR  Pertinent review of systems: No fevers or chills  Relevant historical information: History of lung cancer.  Diabetes.   Exam:  BP (!) 158/82   Pulse 84   Ht 5\' 8"  (1.727 m)   Wt 179 lb (81.2 kg)   LMP  (LMP Unknown)   SpO2 96%   BMI 27.22 kg/m  General: Well Developed, well nourished, and in no acute distress.   MSK: Right shoulder: Normal-appearing Normal motion.  Pain with abduction.  Intact strength.  Knees bilaterally normal-appearing normal motion.  Pain with extension.  Crepitation with range of motion. No significant isolated muscle weakness bilateral lower extremities.  Lab and Radiology Results  Procedure: Real-time Ultrasound Guided Injection of right shoulder glenohumeral joint posterior approach Device: Philips Affiniti 50G Images permanently stored and available for review in PACS Verbal informed consent obtained.  Discussed risks and benefits of procedure. Warned about infection, bleeding, hyperglycemia damage to structures among others. Patient expresses understanding and agreement Time-out conducted.   Noted no overlying erythema, induration, or other  signs of local infection.   Skin prepped in a sterile fashion.   Local anesthesia: Topical Ethyl chloride.   With sterile technique and under real time ultrasound guidance: 40 mg of Kenalog and 2 mL Marcaine injected into glenohumeral joint. Fluid seen entering the joint capsule.   Completed without difficulty   Pain immediately resolved suggesting accurate placement of the medication.   Advised to call if fevers/chills, erythema, induration, drainage, or persistent bleeding.   Images permanently stored and available for review in the ultrasound unit.  Impression: Technically successful ultrasound guided injection.        Assessment and Plan: 77 y.o. female with right shoulder pain thought to be due to history of rotator cuff tear and shoulder degeneration.  Plan for steroid injection today.  Previous injection worked pretty well and lasted a long time.  Bilateral knee pain thought to be due to DJD.  She notes some instability and relatively weakness.  Plan for trial of physical therapy.  ALS is extremely unlikely based on her clinical presentation.  However if not improving nerve conduction study would evaluate ALS pretty well.   PDMP not reviewed this encounter. Orders Placed This Encounter  Procedures   [place] LIMITED JOINT SPACE STRUCTURES UP RIGHT(NO LINKED CHARGES)    Order Specific Question:   Reason for Exam (SYMPTOM  OR DIAGNOSIS REQUIRED)    Answer:   right shoulder pain    Order Specific Question:   Preferred imaging location?    Answer:   [facility]  [place]   Ambulatory referral to Physical Therapy    Referral Priority:   Routine    Referral Type:   Physical Medicine    Referral Reason:   Specialty Services Required    Requested Specialty:   Physical Therapy    Number of Visits Requested:   1   No orders of the defined types were placed in this encounter.    Discussed warning signs or symptoms. Please see discharge instructions. Patient expresses  understanding.   The above documentation has been reviewed and is accurate and complete [name], M.D.

## 2022-09-06 NOTE — Patient Instructions (Addendum)
Thank you for coming in today.   You received an injection today. Seek immediate medical attention if the joint becomes red, extremely painful, or is oozing fluid.   I've referred you to Physical Therapy.  Let us know if you don't hear from them in one week.   Have fun on your trip!!

## 2022-09-09 ENCOUNTER — Other Ambulatory Visit: Payer: Self-pay | Admitting: Family Medicine

## 2022-09-09 DIAGNOSIS — M19041 Primary osteoarthritis, right hand: Secondary | ICD-10-CM

## 2022-09-14 ENCOUNTER — Encounter: Payer: Self-pay | Admitting: Family Medicine

## 2022-09-14 ENCOUNTER — Ambulatory Visit (INDEPENDENT_AMBULATORY_CARE_PROVIDER_SITE_OTHER): Payer: No Typology Code available for payment source | Admitting: Family Medicine

## 2022-09-14 VITALS — BP 126/69 | HR 72 | Ht 68.0 in | Wt 177.0 lb

## 2022-09-14 DIAGNOSIS — E785 Hyperlipidemia, unspecified: Secondary | ICD-10-CM

## 2022-09-14 DIAGNOSIS — E119 Type 2 diabetes mellitus without complications: Secondary | ICD-10-CM | POA: Diagnosis not present

## 2022-09-14 LAB — POCT GLYCOSYLATED HEMOGLOBIN (HGB A1C): HbA1c, POC (controlled diabetic range): 8.2 % — AB (ref 0.0–7.0)

## 2022-09-14 NOTE — Progress Notes (Signed)
Kelli [name] - 77 y.o. female MRN [id number]  Date of birth: May 19, 1946  Subjective Chief Complaint  Patient presents with   Diabetes    HPI Kelli [name] is a 77 y.o. female here today for follow up visit.  She reports that she is doing well.  She continues on clonidine..  BP is well controlled at this time.  She is using basaglar as directed for management of diabetes.  Blood sugars at home have been a little more elevated.  She denies episodes of hypoglycemia.    Remains on pravastatin and tolerating well.  Due for updated lipid panel.   ROS:  A comprehensive ROS was completed and negative except as noted per HPI   No Known Allergies  Past Medical History:  Diagnosis Date   Anxiety    Arthritis    Borderline diabetic    Colon cancer    colon ca dx 02/2009, lung cancer   Depression    Depression with anxiety 09/19/2011   Diabetes mellitus type 2, uncomplicated 12/31/2014   Genetic testing 06/06/2022   H/O colon cancer, stage II 09/19/2011   T3N0  Cecum Sept 2010   High cholesterol    Hyperlipidemia 09/19/2011   Lung nodules 09/19/2011   granulomas   Osteoporosis 2022   Urinary urgency     Past Surgical History:  Procedure Laterality Date   APPENDECTOMY     AUGMENTATION MAMMAPLASTY     BREAST LUMPECTOMY WITH RADIOACTIVE SEED LOCALIZATION Right 03/22/2022   Procedure: RIGHT BREAST LUMPECTOMY WITH RADIOACTIVE SEED LOCALIZATION;  Surgeon: Almond Lint, MD;  Location: [facility];  Service: General;  Laterality: Right;   BREAST SURGERY  1978   augmentation   COLON SURGERY     2010   CRYO INTERCOSTAL NERVE BLOCK N/A 10/05/2014   Procedure: CRYO INTERCOSTAL NERVE BLOCK;  Surgeon: Loreli Slot, MD;  Location: [facility] OR;  Service: Thoracic;  Laterality: N/A;   LYMPH NODE DISSECTION Right 10/05/2014   Procedure: LYMPH NODE DISSECTION;  Surgeon: Loreli Slot, MD;  Location: [facility] OR;  Service: Thoracic;  Laterality: Right;   ORIF HUMERUS  FRACTURE Left 10/01/2018   Procedure: OPEN REDUCTION INTERNAL FIXATION (ORIF) LEFT PROXIMAL HUMERUS FRACTURE;  Surgeon: Cammy Copa, MD;  Location: [facility] OR;  Service: Orthopedics;  Laterality: Left;   PARTIAL COLECTOMY Right 2010   SEGMENTECOMY Right 10/05/2014   Procedure: right lower lobe SEGMENTECTOMY;  Surgeon: Loreli Slot, MD;  Location[facility] OR;  Service: Thoracic;  Laterality: Right;   VIDEO ASSISTED THORACOSCOPY (VATS)/WEDGE RESECTION Right 10/05/2014   Procedure: Right VIDEO ASSISTED THORACOSCOPY (VATS) with right lower lobe lung nodule WEDGE RESECTION;  Surgeon: Loreli Slot, MD;  Location: [facility] OR;  Service: Thoracic;  Laterality: Right;    Social History   Socioeconomic History   Marital status: Divorced    Spouse name: Not on file   Number of children: 0   Years of education: 16   Highest education level: Bachelor's degree (e.g., BA, AB, BS)  Occupational History    Comment: Retired.  Tobacco Use   Smoking status: Former    Packs/day: 3.00    Years: 40.00    Additional pack years: 0.00    Total pack years: 120.00    Types: Cigarettes    Start date: 61    Quit date: 02/06/2009    Years since quitting: 13.6   Smokeless tobacco: Never  Vaping Use   Vaping Use: Never used  Substance and Sexual Activity  Alcohol use: Not Currently    Comment: 1-2 beers every other day   Drug use: Yes    Types: Marijuana    Comment: occasionally   Sexual activity: Not Currently    Birth control/protection: Post-menopausal  Other Topics Concern   Not on file  Social History Narrative   Lives alone. She has been helping take care of her sister, who has Alzheimer's disease.   Social Determinants of Health   Financial Resource Strain: Low Risk  (09/10/2022)   Overall Financial Resource Strain (CARDIA)    Difficulty of Paying Living Expenses: Not hard at all  Food Insecurity: No Food Insecurity (09/10/2022)   Hunger Vital Sign    Worried About Running Out of Food  in the Last Year: Never true    Ran Out of Food in the Last Year: Never true  Transportation Needs: No Transportation Needs (09/10/2022)   PRAPARE - Administrator, Civil ServiceTransportation    Lack of Transportation (Medical): No    Lack of Transportation (Non-Medical): No  Physical Activity: Inactive (09/10/2022)   Exercise Vital Sign    Days of Exercise per Week: 0 days    Minutes of Exercise per Session: 10 min  Stress: No Stress Concern Present (09/10/2022)   [employer]ational [employer] - Occupational Stress Questionnaire    Feeling of Stress : Not at all  Social Connections: Moderately Isolated (09/10/2022)   Social Connection and Isolation Panel [NHANES]    Frequency of Communication with Friends and Family: More than three times a week    Frequency of Social Gatherings with Friends and Family: Twice a week    Attends Religious Services: 1 to 4 times per year    Active Member of [employer] FinancialClubs or Organizations: No    Attends BankerClub or Organization Meetings: Never    Marital Status: Divorced    Family History  Problem Relation Age of Onset   Heart disease Mother    Colon polyps Sister    Lung cancer Sister    Heart disease Brother    Cancer Brother        unk type   Breast cancer Niece    Breast cancer Niece    Other Niece        gene +, possibly BRCA   Colon cancer Neg Hx    Esophageal cancer Neg Hx    Stomach cancer Neg Hx    Rectal cancer Neg Hx     Health Maintenance  Topic Date Due   Medicare Annual Wellness (AWV)  11/02/2022   COVID-19 Vaccine (6 - 2023-24 season) 07/07/2023 (Originally 05/25/2022)   DEXA SCAN  11/11/2022   OPHTHALMOLOGY EXAM  11/18/2022   INFLUENZA VACCINE  01/04/2023   Diabetic kidney evaluation - Urine ACR  02/17/2023   Diabetic kidney evaluation - eGFR measurement  03/15/2023   HEMOGLOBIN A1C  03/16/2023   FOOT EXAM  09/14/2023   MAMMOGRAM  02/18/2024   COLONOSCOPY (Pts 45-2274yrs Insurance coverage will need to be confirmed)  01/29/2025   DTaP/Tdap/Td (2 - Td or  Tdap) 10/01/2028   Pneumonia Vaccine 4365+ Years old  Completed   Hepatitis C Screening  Completed   Zoster Vaccines- Shingrix  Completed   HPV VACCINES  Aged Out     ----------------------------------------------------------------------------------------------------------------------------------------------------------------------------------------------------------------- Physical Exam BP 126/69 (BP Location: Left Arm, Patient Position: Sitting, Cuff Size: Normal)   Pulse 72   Ht 5\' 8"  (1.727 m)   Wt 177 lb (80.3 kg)   LMP  (LMP Unknown)   SpO2 98%  BMI 26.91 kg/m   Physical Exam Constitutional:      Appearance: Normal appearance.  HENT:     Head: Normocephalic and atraumatic.  Eyes:     General: No scleral icterus. Cardiovascular:     Rate and Rhythm: Normal rate and regular rhythm.  Pulmonary:     Effort: Pulmonary effort is normal.     Breath sounds: Normal breath sounds.  Neurological:     Mental Status: She is alert.  Psychiatric:        Mood and Affect: Mood normal.        Behavior: Behavior normal.     ------------------------------------------------------------------------------------------------------------------------------------------------------------------------------------------------------------------- Assessment and Plan  Diabetes mellitus type 2, uncomplicated (HCC) Lab Results  Component Value Date   HGBA1C 8.2 (A) 09/14/2022  Blood sugars are not well-controlled at this time.  Encourage dietary changes to help with blood sugar control.  Hyperlipidemia Continue pravastatin at current strength.   No orders of the defined types were placed in this encounter.   Return in about 4 months (around 01/14/2023) for F/u T2Dm.    This visit occurred during the SARS-CoV-2 public health emergency.  Safety protocols were in place, including screening questions prior to the visit, additional usage of staff PPE, and extensive cleaning of exam room while  observing appropriate contact time as indicated for disinfecting solutions.

## 2022-09-17 NOTE — Assessment & Plan Note (Signed)
Lab Results  Component Value Date   HGBA1C 8.2 (A) 09/14/2022  Blood sugars are not well-controlled at this time.  Encourage dietary changes to help with blood sugar control.

## 2022-09-17 NOTE — Assessment & Plan Note (Signed)
Continue pravastatin at current strength. 

## 2022-09-18 ENCOUNTER — Ambulatory Visit: Payer: No Typology Code available for payment source | Admitting: Family Medicine

## 2022-09-25 ENCOUNTER — Other Ambulatory Visit: Payer: Self-pay | Admitting: Family Medicine

## 2022-09-25 DIAGNOSIS — E785 Hyperlipidemia, unspecified: Secondary | ICD-10-CM

## 2022-09-25 DIAGNOSIS — H25811 Combined forms of age-related cataract, right eye: Secondary | ICD-10-CM | POA: Diagnosis not present

## 2022-09-26 ENCOUNTER — Ambulatory Visit
Payer: No Typology Code available for payment source | Attending: Family Medicine | Admitting: Rehabilitative and Restorative Service Providers"

## 2022-09-26 ENCOUNTER — Encounter: Payer: Self-pay | Admitting: Rehabilitative and Restorative Service Providers"

## 2022-09-26 ENCOUNTER — Other Ambulatory Visit: Payer: Self-pay

## 2022-09-26 DIAGNOSIS — M25511 Pain in right shoulder: Secondary | ICD-10-CM | POA: Diagnosis not present

## 2022-09-26 DIAGNOSIS — M533 Sacrococcygeal disorders, not elsewhere classified: Secondary | ICD-10-CM | POA: Insufficient documentation

## 2022-09-26 DIAGNOSIS — M25662 Stiffness of left knee, not elsewhere classified: Secondary | ICD-10-CM | POA: Insufficient documentation

## 2022-09-26 DIAGNOSIS — M25611 Stiffness of right shoulder, not elsewhere classified: Secondary | ICD-10-CM | POA: Insufficient documentation

## 2022-09-26 DIAGNOSIS — R269 Unspecified abnormalities of gait and mobility: Secondary | ICD-10-CM | POA: Insufficient documentation

## 2022-09-26 DIAGNOSIS — M25661 Stiffness of right knee, not elsewhere classified: Secondary | ICD-10-CM | POA: Diagnosis not present

## 2022-09-26 DIAGNOSIS — R293 Abnormal posture: Secondary | ICD-10-CM | POA: Insufficient documentation

## 2022-09-26 DIAGNOSIS — R29898 Other symptoms and signs involving the musculoskeletal system: Secondary | ICD-10-CM | POA: Insufficient documentation

## 2022-09-26 DIAGNOSIS — M25572 Pain in left ankle and joints of left foot: Secondary | ICD-10-CM | POA: Insufficient documentation

## 2022-09-26 DIAGNOSIS — M6281 Muscle weakness (generalized): Secondary | ICD-10-CM | POA: Diagnosis not present

## 2022-09-26 NOTE — Therapy (Addendum)
OUTPATIENT PHYSICAL THERAPY LOWER EXTREMITY EVALUATION   Patient Name: Kelli [name] MRN: [id number] DOB:01-19-1946, 77 y.o., female Today's Date: 10/04/2022  END OF SESSION:   Past Medical History:  Diagnosis Date   Anxiety    Arthritis    Borderline diabetic    Colon cancer (HCC)    colon ca dx 02/2009, lung cancer   Depression    Depression with anxiety 09/19/2011   Diabetes mellitus type 2, uncomplicated (HCC) 12/31/2014   Genetic testing 06/06/2022   H/O colon cancer, stage II 09/19/2011   T3N0  Cecum Sept 2010   High cholesterol    Hyperlipidemia 09/19/2011   Lung nodules 09/19/2011   granulomas   Osteoporosis 2022   Urinary urgency    Past Surgical History:  Procedure Laterality Date   APPENDECTOMY     AUGMENTATION MAMMAPLASTY     BREAST LUMPECTOMY WITH RADIOACTIVE SEED LOCALIZATION Right 03/22/2022   Procedure: RIGHT BREAST LUMPECTOMY WITH RADIOACTIVE SEED LOCALIZATION;  Surgeon: Almond Lint, MD;  Location: [facility];  Service: General;  Laterality: Right;   BREAST SURGERY  1978   augmentation   COLON SURGERY     2010   CRYO INTERCOSTAL NERVE BLOCK N/A 10/05/2014   Procedure: CRYO INTERCOSTAL NERVE BLOCK;  Surgeon: Loreli Slot, MD;  Location: [facility] OR;  Service: Thoracic;  Laterality: N/A;   LYMPH NODE DISSECTION Right 10/05/2014   Procedure: LYMPH NODE DISSECTION;  Surgeon: Loreli Slot, MD;  Location: [facility] OR;  Service: Thoracic;  Laterality: Right;   ORIF HUMERUS FRACTURE Left 10/01/2018   Procedure: OPEN REDUCTION INTERNAL FIXATION (ORIF) LEFT PROXIMAL HUMERUS FRACTURE;  Surgeon: Cammy Copa, MD;  Location: [facility] OR;  Service: Orthopedics;  Laterality: Left;   PARTIAL COLECTOMY Right 2010   SEGMENTECOMY Right 10/05/2014   Procedure: right lower lobe SEGMENTECTOMY;  Surgeon: Loreli Slot, MD;  Location: [facility] OR;  Service: Thoracic;  Laterality: Right;   VIDEO ASSISTED THORACOSCOPY (VATS)/WEDGE RESECTION Right 10/05/2014    Procedure: Right VIDEO ASSISTED THORACOSCOPY (VATS) with right lower lobe lung nodule WEDGE RESECTION;  Surgeon: Loreli Slot, MD;  Location: [facility] OR;  Service: Thoracic;  Laterality: Right;   Patient Active Problem List   Diagnosis Date Noted   Sebaceous cyst 06/20/2022   Genetic testing 06/06/2022   Malignant neoplasm of upper-outer quadrant of right breast in female, estrogen receptor positive (HCC) 03/06/2022   Avulsion fracture of left ankle 10/27/2021   Skin lesion 07/05/2021   Insomnia 03/31/2021   Osteoporosis 11/12/2020   Closed fracture of left proximal humerus 09/26/2018   Squamous cell carcinoma 03/05/2017   Echocardiogram shows left ventricular diastolic dysfunction 08/21/2016   Heart palpitations 07/25/2016   Need for vaccination for pneumococcus 07/13/2016   Osteoarthritis of hand, right 08/06/2015   Diabetes mellitus type 2, uncomplicated (HCC) 12/31/2014   Lung cancer, lower lobe (HCC) 10/09/2014   Hematochezia 04/01/2013   H/O colon cancer, stage II 09/19/2011   Depression with anxiety 09/19/2011   Hyperlipidemia 09/19/2011    PCP: Dr Everrett Coombe  REFERRING PROVIDER: Dr Clementeen Graham   REFERRING DIAG: bilateral leg weakness   THERAPY DIAG:  Muscle weakness (generalized) - Plan: PT plan of care cert/re-cert  Other symptoms and signs involving the musculoskeletal system - Plan: PT plan of care cert/re-cert  Stiffness of both knees - Plan: PT plan of care cert/re-cert  Rationale for Evaluation and Treatment: Rehabilitation  ONSET DATE: 07/20/22  SUBJECTIVE:  SUBJECTIVE STATEMENT: Noticed weakness in bilat LE weakness for the past 2 months with no known injury. Noticed weakness in Rt knee then Lt and now both. She has not fallen but has a sense of legs "giving  away"   Hand dominance: Right  PERTINENT HISTORY: Fx Lt shoulder with ORIF; chronic Rt shoulder pain; HTN; arthritis; osteoporosis; history of cancer colon, lung, breast; fx Lt foot; hx of LBP    PAIN:  Are you having pain? Yes: NPRS scale: 0/10 Pain location:  Pain description:  Aggravating factors:  Relieving factors:   PRECAUTIONS:   WEIGHT BEARING RESTRICTIONS: No  FALLS:  Has patient fallen in last 6 months? No  LIVING ENVIRONMENT: Lives with: lives alone Lives in: House/apartment Stairs: level entry; one level  Has following equipment at home: None  OCCUPATION: Retired; Primary school teacher Activities: household chores; water exercise in summer; travel; dog sitting   PLOF: Independent  PATIENT GOALS: get stronger  NEXT MD VISIT: 01/15/23  OBJECTIVE:   DIAGNOSTIC FINDINGS:  None for knees   COGNITION: Overall cognitive status: Within functional limits for tasks assessed     SENSATION: WFL  POSTURE: Patient presents with head forward posture with increased thoracic kyphosis; shoulders rounded and elevated; scapulae abducted and rotated along the thoracic spine; head of the humerus anterior in orientation; slightly flexed forward at hips   LOWER EXTREMITY ROM:     Active  Right eval Left eval  Hip flexion    Hip extension    Hip abduction    Hip adduction    Hip internal rotation    Hip external rotation    Knee flexion 137 135  Knee extension -10 -4  Ankle dorsiflexion 5 0  Ankle plantarflexion    Ankle inversion    Ankle eversion     (Blank rows = not tested)   LOWER EXTREMITY MMT:    MMT Right eval Left eval  Hip flexion 4+ 4+  Hip extension 4- 4-  Hip abduction 4+ 4+  Hip adduction    Hip internal rotation    Hip external rotation    Knee flexion 5 5  Knee extension 5 5  Ankle dorsiflexion 4+ 4  Ankle plantarflexion 4 4  Ankle inversion    Ankle eversion     (Blank rows = not tested)   5 times sit to stand: 13.65 sec       [facility] Adult PT Treatment:                                                DATE: 09/26/22 Therapeutic Exercise: HS stretch sitting 30 sec x 2 Sit to stand slowly x 10  Gastroc stretch 30 sec x 2 Soleus stretch 30 sec x 2    PATIENT EDUCATION: Education details: POC; HEP  Person educated: Patient Education method: Programmer, multimedia, Demonstration, Tactile cues, Verbal cues, and Handouts Education comprehension: verbalized understanding, returned demonstration, verbal cues required, tactile cues required, and needs further education  HOME EXERCISE PROGRAM: Access Code: [email or web address] URL: [email or web address] Date: 09/26/2022 Prepared by: Corlis Leak  Exercises - Seated Hamstring Stretch  - 2 x daily - 7 x weekly - 1 sets - 3 reps - 30 sec  hold - Sit to Stand  - 2 x daily - 7 x weekly - 1-2 sets - 10 reps - 3-5 sec  hold - Theme park manager  on Wall  - 2 x daily - 7 x weekly - 1 sets - 3 reps - 30 sec  hold - Soleus Stretch on Wall  - 2 x daily - 7 x weekly - 1 sets - 3 reps - 30 sec  hold  ASSESSMENT:  CLINICAL IMPRESSION: Patient is a 77 y.o. female who was seen today for physical therapy evaluation and treatment for bilat LE weakness she has noticed over the past ~ 2 months ago with no known injury. Patient has stiffness and tightness with limited ROM bilat knee extension Rt > Lt; weakness bilat LE's; fear of falling. She will benefit form PT to address problems identified.   OBJECTIVE IMPAIRMENTS: decreased activity tolerance, decreased mobility, decreased ROM, decreased strength, increased fascial restrictions, increased muscle spasms, impaired flexibility, impaired UE functional use, improper body mechanics, postural dysfunction, and pain.   ACTIVITY LIMITATIONS: carrying, lifting, bending, sleeping, and reach over head  PARTICIPATION LIMITATIONS: meal prep, cleaning, laundry, community activity, and yard work  PERSONAL FACTORS: Behavior pattern, Fitness, Past/current  experiences, and Time since onset of injury/illness/exacerbation are also affecting patient's functional outcome.   REHAB POTENTIAL: Good  CLINICAL DECISION MAKING: Stable/uncomplicated  EVALUATION COMPLEXITY: Low   GOALS: Goals reviewed with patient? Yes  SHORT TERM GOALS: Target date: 10/24/2022   Independent in initial HEP  Baseline: Goal status: INITIAL  2.  Increase Rt knee extension to (-) 5 and Lt knee extension to 0 degrees  Baseline:  Goal status: INITIAL   LONG TERM GOALS: Target date: 11/21/2022   Increase bilat LE strength to 5-/5 to 5/5  Baseline:  Goal status: INITIAL  2.  Increased Rt knee extension to (-) 2 to 0 degrees  Baseline:  Goal status: INITIAL  3.  Improve times sit to stand to </= 12 sec  Baseline:  Goal status: INITIAL  4.  Patient reports ability to walk for 20 min without sensation of weakness or "leg giving away" Baseline:  Goal status: INITIAL  5.  Independent in HEP including aquatic program  Baseline:  Goal status: INITIAL   PLAN:  PT FREQUENCY: 2x/week  PT DURATION: 8 weeks  PLANNED INTERVENTIONS: Therapeutic exercises, Therapeutic activity, Neuromuscular re-education, Patient/Family education, Self Care, Joint mobilization, Aquatic Therapy, Dry Needling, Electrical stimulation, Spinal mobilization, Cryotherapy, Moist heat, Taping, Vasopneumatic device, Ultrasound, Ionotophoresis 4mg /ml Dexamethasone, Manual therapy, and Re-evaluation  PLAN FOR NEXT SESSION: review and progress exercises; continue with postural correction and education; manual work, DN, modalities as indicated   [employer], PT 10/04/2022, 3:58 PM

## 2022-09-26 NOTE — Addendum Note (Signed)
Addended by: Val Riles on: 09/26/2022 01:10 PM   Modules accepted: Orders

## 2022-09-28 ENCOUNTER — Ambulatory Visit: Payer: No Typology Code available for payment source

## 2022-09-28 DIAGNOSIS — M6281 Muscle weakness (generalized): Secondary | ICD-10-CM

## 2022-09-28 DIAGNOSIS — M533 Sacrococcygeal disorders, not elsewhere classified: Secondary | ICD-10-CM

## 2022-09-28 DIAGNOSIS — M25572 Pain in left ankle and joints of left foot: Secondary | ICD-10-CM

## 2022-09-28 DIAGNOSIS — R293 Abnormal posture: Secondary | ICD-10-CM

## 2022-09-28 DIAGNOSIS — R269 Unspecified abnormalities of gait and mobility: Secondary | ICD-10-CM

## 2022-09-28 DIAGNOSIS — R29898 Other symptoms and signs involving the musculoskeletal system: Secondary | ICD-10-CM

## 2022-09-28 DIAGNOSIS — M25661 Stiffness of right knee, not elsewhere classified: Secondary | ICD-10-CM

## 2022-09-28 NOTE — Therapy (Signed)
OUTPATIENT PHYSICAL THERAPY LE TREATMENT   Patient Name: Kelli [name] MRN: [id number] DOB:Sep 11, 1945, 77 y.o., female Today's Date: 09/28/2022  END OF SESSION:  PT End of Session - 09/28/22 1015     Visit Number 2    Number of Visits 16    Date for PT Re-Evaluation 11/07/22    Authorization Type [employer] 2024 copay [id number]    PT Start Time 1015    PT Stop Time 1055    PT Time Calculation (min) 40 min    Activity Tolerance Patient tolerated treatment well    Behavior During Therapy [facility] for tasks assessed/performed             Past Medical History:  Diagnosis Date   Anxiety    Arthritis    Borderline diabetic    Colon cancer    colon ca dx 02/2009, lung cancer   Depression    Depression with anxiety 09/19/2011   Diabetes mellitus type 2, uncomplicated 12/31/2014   Genetic testing 06/06/2022   H/O colon cancer, stage II 09/19/2011   T3N0  Cecum Sept 2010   High cholesterol    Hyperlipidemia 09/19/2011   Lung nodules 09/19/2011   granulomas   Osteoporosis 2022   Urinary urgency    Past Surgical History:  Procedure Laterality Date   APPENDECTOMY     AUGMENTATION MAMMAPLASTY     BREAST LUMPECTOMY WITH RADIOACTIVE SEED LOCALIZATION Right 03/22/2022   Procedure: RIGHT BREAST LUMPECTOMY WITH RADIOACTIVE SEED LOCALIZATION;  Surgeon: Almond Lint, MD;  Location: [facility];  Service: General;  Laterality: Right;   BREAST SURGERY  1978   augmentation   COLON SURGERY     2010   CRYO INTERCOSTAL NERVE BLOCK N/A 10/05/2014   Procedure: CRYO INTERCOSTAL NERVE BLOCK;  Surgeon: Loreli Slot, MD;  Location: [facility] OR;  Service: Thoracic;  Laterality: N/A;   LYMPH NODE DISSECTION Right 10/05/2014   Procedure: LYMPH NODE DISSECTION;  Surgeon: Loreli Slot, MD;  Location: [facility] OR;  Service: Thoracic;  Laterality: Right;   ORIF HUMERUS FRACTURE Left 10/01/2018   Procedure: OPEN REDUCTION INTERNAL FIXATION (ORIF) LEFT PROXIMAL HUMERUS FRACTURE;   Surgeon: Cammy Copa, MD;  Location: [facility] OR;  Service: Orthopedics;  Laterality: Left;   PARTIAL COLECTOMY Right 2010   SEGMENTECOMY Right 10/05/2014   Procedure: right lower lobe SEGMENTECTOMY;  Surgeon: Loreli Slot, MD;  Location: [facility] OR;  Service: Thoracic;  Laterality: Right;   VIDEO ASSISTED THORACOSCOPY (VATS)/WEDGE RESECTION Right 10/05/2014   Procedure: Right VIDEO ASSISTED THORACOSCOPY (VATS) with right lower lobe lung nodule WEDGE RESECTION;  Surgeon: Loreli Slot, MD;  Location: [facility] OR;  Service: Thoracic;  Laterality: Right;   Patient Active Problem List   Diagnosis Date Noted   Sebaceous cyst 06/20/2022   Genetic testing 06/06/2022   Malignant neoplasm of upper-outer quadrant of right breast in female, estrogen receptor positive 03/06/2022   Avulsion fracture of left ankle 10/27/2021   Skin lesion 07/05/2021   Insomnia 03/31/2021   Osteoporosis 11/12/2020   Closed fracture of left proximal humerus 09/26/2018   Squamous cell carcinoma 03/05/2017   Echocardiogram shows left ventricular diastolic dysfunction 08/21/2016   Heart palpitations 07/25/2016   Need for vaccination for pneumococcus 07/13/2016   Osteoarthritis of hand, right 08/06/2015   Diabetes mellitus type 2, uncomplicated 12/31/2014   Lung cancer, lower lobe 10/09/2014   Hematochezia 04/01/2013   H/O colon cancer, stage II 09/19/2011   Depression with anxiety 09/19/2011   Hyperlipidemia 09/19/2011  PCP: Dr Everrett Coombe  REFERRING PROVIDER: Dr Clementeen Graham   REFERRING DIAG: bilateral leg weakness   THERAPY DIAG:  Muscle weakness (generalized)  Other symptoms and signs involving the musculoskeletal system  Stiffness of both knees  Acute left ankle pain  Abnormal gait  Coccyx pain  Abnormal posture  Rationale for Evaluation and Treatment: Rehabilitation  ONSET DATE: 07/20/22  SUBJECTIVE:                                                                                                                                                                                       SUBJECTIVE STATEMENT: Patient reports no pain in bilateral knees, states they feel like they will buckle on her when she is walking/standing.  Hand dominance: Right  PERTINENT HISTORY: Fx Lt shoulder with ORIF; chronic Rt shoulder pain; HTN; arthritis; osteoporosis; history of cancer colon, lung, breast; fx Lt foot; hx of LBP    PAIN:  Are you having pain? Yes: NPRS scale: 0/10 Pain location:  Pain description:  Aggravating factors:  Relieving factors:   PRECAUTIONS:   WEIGHT BEARING RESTRICTIONS: No  FALLS:  Has patient fallen in last 6 months? No  LIVING ENVIRONMENT: Lives with: lives alone Lives in: House/apartment Stairs: level entry; one level  Has following equipment at home: None  OCCUPATION: Retired; Primary school teacher Activities: household chores; water exercise in summer; travel; dog sitting   PLOF: Independent  PATIENT GOALS: get stronger  NEXT MD VISIT: 01/15/23  OBJECTIVE:   DIAGNOSTIC FINDINGS:  None for knees   COGNITION: Overall cognitive status: Within functional limits for tasks assessed     SENSATION: WFL  POSTURE: Patient presents with head forward posture with increased thoracic kyphosis; shoulders rounded and elevated; scapulae abducted and rotated along the thoracic spine; head of the humerus anterior in orientation; slightly flexed forward at hips   LOWER EXTREMITY ROM:     Active  Right eval Left eval  Hip flexion    Hip extension    Hip abduction    Hip adduction    Hip internal rotation    Hip external rotation    Knee flexion 137 135  Knee extension -10 -4  Ankle dorsiflexion 5 0  Ankle plantarflexion    Ankle inversion    Ankle eversion     (Blank rows = not tested)   LOWER EXTREMITY MMT:    MMT Right eval Left eval  Hip flexion 4+ 4+  Hip extension 4- 4-  Hip abduction 4+ 4+  Hip adduction    Hip internal rotation     Hip external rotation    Knee flexion 5 5  Knee extension 5 5  Ankle dorsiflexion 4+ 4  Ankle plantarflexion 4 4  Ankle inversion    Ankle eversion     (Blank rows = not tested)   5 times sit to stand: 13.65 sec      [facility] Adult PT Treatment:                                                DATE: 09/28/2022 Therapeutic Exercise: NuStep L5 x 5 min Mat Table: Seated HS stretch (heel propped on stool) Small range SLR 2#AW x10 B S/L resisted clamshells RTB x15 S/L straight leg hip abd RTB x15 Seated LAQ 4#AW x10 B Seated knee extension RTB x10 Side stepping RTB crossed at ankles (counter)   [facility] Adult PT Treatment:                                                DATE: 09/26/22 Therapeutic Exercise: HS stretch sitting 30 sec x 2 Sit to stand slowly x 10  Gastroc stretch 30 sec x 2 Soleus stretch 30 sec x 2    PATIENT EDUCATION: Education details: Updated HEP  Person educated: Patient Education method: Explanation, Demonstration, Tactile cues, Verbal cues, and Handouts Education comprehension: verbalized understanding, returned demonstration, verbal cues required, tactile cues required, and needs further education  HOME EXERCISE PROGRAM: Access Code: [email or web address] URL: [email or web address] Date: 09/28/2022 Prepared by: Carlynn Herald  Exercises - Seated Hamstring Stretch  - 2 x daily - 7 x weekly - 1 sets - 3 reps - 30 sec  hold - Sit to Stand  - 2 x daily - 7 x weekly - 1-2 sets - 10 reps - 3-5 sec  hold - Gastroc Stretch on Wall  - 2 x daily - 7 x weekly - 1 sets - 3 reps - 30 sec  hold - Soleus Stretch on Wall  - 2 x daily - 7 x weekly - 1 sets - 3 reps - 30 sec  hold - Clam with Resistance  - 1 x daily - 7 x weekly - 3 sets - 10 reps - Seated Knee Extension with Resistance  - 1 x daily - 7 x weekly - 3 sets - 10 reps  ASSESSMENT:  CLINICAL IMPRESSION:  Quad and hip strengthening continued to progress LE strengthening. Adding light ankle weights and cueing  quad set improved quad activation during straight leg raises.   OBJECTIVE IMPAIRMENTS: decreased activity tolerance, decreased mobility, decreased ROM, decreased strength, increased fascial restrictions, increased muscle spasms, impaired flexibility, impaired UE functional use, improper body mechanics, postural dysfunction, and pain.   ACTIVITY LIMITATIONS: carrying, lifting, bending, sleeping, and reach over head  PARTICIPATION LIMITATIONS: meal prep, cleaning, laundry, community activity, and yard work  PERSONAL FACTORS: Behavior pattern, Fitness, Past/current experiences, and Time since onset of injury/illness/exacerbation are also affecting patient's functional outcome.   REHAB POTENTIAL: Good  CLINICAL DECISION MAKING: Stable/uncomplicated  EVALUATION COMPLEXITY: Low   GOALS: Goals reviewed with patient? Yes  SHORT TERM GOALS: Target date: 10/24/2022  Independent in initial HEP  Baseline: Goal status: INITIAL  2.  Increase Rt knee extension to (-) 5 and Lt knee extension to 0 degrees  Baseline:  Goal status: INITIAL   LONG TERM GOALS: Target date: 11/21/2022  Increase  bilat LE strength to 5-/5 to 5/5  Baseline:  Goal status: INITIAL  2.  Increased Rt knee extension to (-) 2 to 0 degrees  Baseline:  Goal status: INITIAL  3.  Improve times sit to stand to </= 12 sec  Baseline:  Goal status: INITIAL  4.  Patient reports ability to walk for 20 min without sensation of weakness or "leg giving away" Baseline:  Goal status: INITIAL  5.  Independent in HEP including aquatic program  Baseline:  Goal status: INITIAL   PLAN:  PT FREQUENCY: 2x/week  PT DURATION: 8 weeks  PLANNED INTERVENTIONS: Therapeutic exercises, Therapeutic activity, Neuromuscular re-education, Patient/Family education, Self Care, Joint mobilization, Aquatic Therapy, Dry Needling, Electrical stimulation, Spinal mobilization, Cryotherapy, Moist heat, Taping, Vasopneumatic device, Ultrasound,  Ionotophoresis 4mg /ml Dexamethasone, Manual therapy, and Re-evaluation  PLAN FOR NEXT SESSION: Progress exercises; continue with postural correction and education; manual work, DN, modalities as indicated   Sanjuana Mae, PTA 09/28/2022, 10:57 AM

## 2022-10-02 DIAGNOSIS — E785 Hyperlipidemia, unspecified: Secondary | ICD-10-CM | POA: Diagnosis not present

## 2022-10-02 DIAGNOSIS — E119 Type 2 diabetes mellitus without complications: Secondary | ICD-10-CM | POA: Diagnosis not present

## 2022-10-03 ENCOUNTER — Encounter: Payer: No Typology Code available for payment source | Admitting: Rehabilitative and Restorative Service Providers"

## 2022-10-03 LAB — CBC WITH DIFFERENTIAL/PLATELET
Absolute Monocytes: 483 cells/uL (ref 200–950)
Basophils Absolute: 41 cells/uL (ref 0–200)
Basophils Relative: 0.6 %
Eosinophils Absolute: 221 cells/uL (ref 15–500)
Eosinophils Relative: 3.2 %
HCT: 37.9 % (ref 35.0–45.0)
Hemoglobin: 12.5 g/dL (ref 11.7–15.5)
Lymphs Abs: 2719 cells/uL (ref 850–3900)
MCH: 30.9 pg (ref 27.0–33.0)
MCHC: 33 g/dL (ref 32.0–36.0)
MCV: 93.8 fL (ref 80.0–100.0)
MPV: 10.7 fL (ref 7.5–12.5)
Monocytes Relative: 7 %
Neutro Abs: 3436 cells/uL (ref 1500–7800)
Neutrophils Relative %: 49.8 %
Platelets: 276 10*3/uL (ref 140–400)
RBC: 4.04 10*6/uL (ref 3.80–5.10)
RDW: 12.4 % (ref 11.0–15.0)
Total Lymphocyte: 39.4 %
WBC: 6.9 10*3/uL (ref 3.8–10.8)

## 2022-10-03 LAB — COMPLETE METABOLIC PANEL WITH GFR
AG Ratio: 1.8 (calc) (ref 1.0–2.5)
ALT: 20 U/L (ref 6–29)
AST: 19 U/L (ref 10–35)
Albumin: 4.1 g/dL (ref 3.6–5.1)
Alkaline phosphatase (APISO): 82 U/L (ref 37–153)
BUN: 15 mg/dL (ref 7–25)
CO2: 30 mmol/L (ref 20–32)
Calcium: 9.3 mg/dL (ref 8.6–10.4)
Chloride: 106 mmol/L (ref 98–110)
Creat: 0.71 mg/dL (ref 0.60–1.00)
Globulin: 2.3 g/dL (calc) (ref 1.9–3.7)
Glucose, Bld: 103 mg/dL — ABNORMAL HIGH (ref 65–99)
Potassium: 4.5 mmol/L (ref 3.5–5.3)
Sodium: 142 mmol/L (ref 135–146)
Total Bilirubin: 0.4 mg/dL (ref 0.2–1.2)
Total Protein: 6.4 g/dL (ref 6.1–8.1)
eGFR: 89 mL/min/{1.73_m2} (ref >=60)

## 2022-10-03 LAB — LIPID PANEL W/REFLEX DIRECT LDL
Cholesterol: 187 mg/dL (ref <200)
HDL: 57 mg/dL (ref >=50)
LDL Cholesterol (Calc): 111 mg/dL (calc) — ABNORMAL HIGH
Non-HDL Cholesterol (Calc): 130 mg/dL (calc) — ABNORMAL HIGH (ref <130)
Total CHOL/HDL Ratio: 3.3 (calc) (ref <5.0)
Triglycerides: 86 mg/dL (ref <150)

## 2022-10-09 ENCOUNTER — Inpatient Hospital Stay: Payer: No Typology Code available for payment source | Attending: Oncology | Admitting: Oncology

## 2022-10-09 VITALS — BP 138/69 | HR 80 | Temp 98.1°F | Resp 18 | Ht 68.0 in | Wt 180.0 lb

## 2022-10-09 DIAGNOSIS — Z85038 Personal history of other malignant neoplasm of large intestine: Secondary | ICD-10-CM | POA: Insufficient documentation

## 2022-10-09 DIAGNOSIS — Z79811 Long term (current) use of aromatase inhibitors: Secondary | ICD-10-CM | POA: Insufficient documentation

## 2022-10-09 DIAGNOSIS — D0511 Intraductal carcinoma in situ of right breast: Secondary | ICD-10-CM | POA: Diagnosis not present

## 2022-10-09 DIAGNOSIS — Z85118 Personal history of other malignant neoplasm of bronchus and lung: Secondary | ICD-10-CM

## 2022-10-09 NOTE — Progress Notes (Signed)
[facility] OFFICE PROGRESS NOTE   Diagnosis: DCIS, colon cancer, lung cancer  INTERVAL HISTORY:   Kelli [name] returns as scheduled.  She feels well.  She reports hot flashes in the evening since starting anastrozole.  No arthralgias.  The hot flashes have improved.  No other complaint.  Objective:  Vital signs in last 24 hours:  Blood pressure 138/69, pulse 80, temperature 98.1 F (36.7 C), temperature source Oral, resp. rate 18, height 5\' 8"  (1.727 m), weight 180 lb (81.6 kg), SpO2 98 %.    Lymphatics: No cervical, supraclavicular, axillary, or inguinal nodes Resp: Decreased breath sounds at the right lower posterior chest, no respiratory distress Cardio: Regular rate and rhythm GI: No hepatosplenomegaly Vascular: No leg edema Breast: Bilateral breast implants, right areola incision without evidence of recurrent tumor.  2-3 cm soft oval cutaneous lesion at the inferior middle breast skin fold  Lab Results:  Lab Results  Component Value Date   WBC 6.9 10/02/2022   HGB 12.5 10/02/2022   HCT 37.9 10/02/2022   MCV 93.8 10/02/2022   PLT 276 10/02/2022   NEUTROABS 3,436 10/02/2022    CMP  Lab Results  Component Value Date   NA 142 10/02/2022   K 4.5 10/02/2022   CL 106 10/02/2022   CO2 30 10/02/2022   GLUCOSE 103 (H) 10/02/2022   BUN 15 10/02/2022   CREATININE 0.71 10/02/2022   CALCIUM 9.3 10/02/2022   PROT 6.4 10/02/2022   ALBUMIN 2.1 (L) 09/20/2020   AST 19 10/02/2022   ALT 20 10/02/2022   ALKPHOS 100 09/20/2020   BILITOT 0.4 10/02/2022   GFRNONAA >60 03/14/2022   GFRAA 87 11/08/2020    Lab Results  Component Value Date   CEA 1.8 08/28/2014    Medications: I have reviewed the patient's current medications.   Assessment/Plan: 1.Stage II (T3 N0) adenocarcinoma the cecum diagnosed in September 2010, status post adjuvant Xeloda MSI-high, BRAF mutation detected 2. History of colonic polyps-status post a surveillance colonoscopy in February  2016 with a tubular adenoma removed Colonoscopy 01/30/2020-polyps removed from the rectum and descending colon, hyperplastic polyps 3. History of heavy tobacco use, quit in 2010 4. History of lung nodules-last imaging was an abdomen CT in October 2013 prior to repeat imaging 09/03/2014 CT 09/03/2014 revealed enlargement of a spiculated right lower lobe nodule PET scan 09/16/2014 revealed low-level FDG activity associated with the right lower lobe spiculated nodule Status post a right lower lobe basilar segmentectomy and mediastinal lymph node dissection on 10/06/2014 with the pathology confirming a minimally invasive well-differentiated adenocarcinoma,pT1a,N0, invasive component measured less than 0.3 cm, with negative surgical margins CT chest 09/15/2015 with no evidence of recurrent lung cancer, stable nodules except for a probable new 4 mm right lower lobe subpleural nodule CT chest 06/19/2016-no evidence of recurrent lung cancer, stable lung nodules CT chest 03/20/2017-stable lung nodules CT chest 03/26/2018- stable lung nodules CT chest 04/09/2019-stable lung nodules CT chest 04/10/2019-stable lung nodules CT chest 10/25/2020-stable lung nodules.  Resolution of multifocal pulmonary infiltrate and improvement in bronchial wall thickening. CT chest 01/18/2021-stable small solid nodules and groundglass opacities. CT chest 07/23/2021-subpleural focal area of groundglass attenuation within the anterior left upper lobe mildly increased when compared to remote priors, stable when compared to more recent priors.  Additional multiple bilateral pulmonary nodules grossly stable when compared to chest CT exams dating back to 2018. CT chest 01/19/2022-stable upper lobe groundglass opacity, multiple stable bilateral pulmonary nodules   5.  Post thoracotomy pain-resolved  6.  Left humerus fracture following a fall, status post surgical repair 10/01/2018  7.  Breast DCIS Mammogram 02/17/2022-new group of  heterogenous calcifications in the posterior upper right breast spanning 2.5 cm Targeted right breast ultrasound 915 2023-1 x 0.5 x 1.7 cm ill-defined hypoechoic area at the 12 o'clock position of the right breast with calcifications no abnormal right axillary lymph nodes Ultrasound-guided right breast biopsy 02/27/2022-media grade DCIS, no invasive carcinoma, necrosis and calcifications present, DCIS length 0.5 cm, ER 95%, PR 50% Seed localized right lumpectomy 03/22/2022, anterior margin skin, posterior margin implant capsule-DCIS, grade 2, 11 x 9 x 8 mm, necrosis present, all margins negative, closest margin anterior-2 mm, superior-3 mm, medial-4 mm, posterior-6 mm, inferior-9 mm, lateral 24 mm.  No lymph nodes submitted Anastrozole beginning after office visit with Dr. Roselind Messier 04/12/2022     Disposition: Kelli [name] is in clinical remission from lung cancer, colon cancer, and DCIS.  She will continue anastrozole.  She will be scheduled for a surveillance chest CT in August.  She will return for an office visit in 6 months.  The soft oval cutaneous lesion inferior to the right breast is likely a benign finding, lipoma?.  She reports this lesion has been present for years.  Thornton Papas, MD  10/09/2022  3:10 PM

## 2022-10-10 ENCOUNTER — Ambulatory Visit
Payer: No Typology Code available for payment source | Attending: Family Medicine | Admitting: Rehabilitative and Restorative Service Providers"

## 2022-10-10 ENCOUNTER — Encounter: Payer: Self-pay | Admitting: Rehabilitative and Restorative Service Providers"

## 2022-10-10 DIAGNOSIS — M6281 Muscle weakness (generalized): Secondary | ICD-10-CM

## 2022-10-10 DIAGNOSIS — M25662 Stiffness of left knee, not elsewhere classified: Secondary | ICD-10-CM | POA: Diagnosis not present

## 2022-10-10 DIAGNOSIS — R29898 Other symptoms and signs involving the musculoskeletal system: Secondary | ICD-10-CM | POA: Diagnosis not present

## 2022-10-10 DIAGNOSIS — R269 Unspecified abnormalities of gait and mobility: Secondary | ICD-10-CM | POA: Diagnosis not present

## 2022-10-10 DIAGNOSIS — M25661 Stiffness of right knee, not elsewhere classified: Secondary | ICD-10-CM | POA: Diagnosis not present

## 2022-10-10 DIAGNOSIS — M25572 Pain in left ankle and joints of left foot: Secondary | ICD-10-CM | POA: Insufficient documentation

## 2022-10-10 NOTE — Therapy (Signed)
OUTPATIENT PHYSICAL THERAPY LE TREATMENT   Patient Name: Kelli [name] MRN: [id number] DOB:1945/12/11, 77 y.o., female Today's Date: 10/10/2022  END OF SESSION:  PT End of Session - 10/10/22 1315     Visit Number 3    Number of Visits 16    Date for PT Re-Evaluation 11/07/22    Authorization Type [employer] 2024 copay $[id number]    PT Start Time 1315    PT Stop Time 1400    PT Time Calculation (min) 45 min    Activity Tolerance Patient tolerated treatment well             Past Medical History:  Diagnosis Date   Anxiety    Arthritis    Borderline diabetic    Colon cancer (HCC)    colon ca dx 02/2009, lung cancer   Depression    Depression with anxiety 09/19/2011   Diabetes mellitus type 2, uncomplicated (HCC) 12/31/2014   Genetic testing 06/06/2022   H/O colon cancer, stage II 09/19/2011   T3N0  Cecum Sept 2010   High cholesterol    Hyperlipidemia 09/19/2011   Lung nodules 09/19/2011   granulomas   Osteoporosis 2022   Urinary urgency    Past Surgical History:  Procedure Laterality Date   APPENDECTOMY     AUGMENTATION MAMMAPLASTY     BREAST LUMPECTOMY WITH RADIOACTIVE SEED LOCALIZATION Right 03/22/2022   Procedure: RIGHT BREAST LUMPECTOMY WITH RADIOACTIVE SEED LOCALIZATION;  Surgeon: Almond Lint, MD;  Location: [facility];  Service: General;  Laterality: Right;   BREAST SURGERY  1978   augmentation   COLON SURGERY     2010   CRYO INTERCOSTAL NERVE BLOCK N/A 10/05/2014   Procedure: CRYO INTERCOSTAL NERVE BLOCK;  Surgeon: Loreli Slot, MD;  Location: [facility] OR;  Service: Thoracic;  Laterality: N/A;   LYMPH NODE DISSECTION Right 10/05/2014   Procedure: LYMPH NODE DISSECTION;  Surgeon: Loreli Slot, MD;  Location: [facility] OR;  Service: Thoracic;  Laterality: Right;   ORIF HUMERUS FRACTURE Left 10/01/2018   Procedure: OPEN REDUCTION INTERNAL FIXATION (ORIF) LEFT PROXIMAL HUMERUS FRACTURE;  Surgeon: Cammy Copa, MD;  Location: [facility] OR;   Service: Orthopedics;  Laterality: Left;   PARTIAL COLECTOMY Right 2010   SEGMENTECOMY Right 10/05/2014   Procedure: right lower lobe SEGMENTECTOMY;  Surgeon: Loreli Slot, MD;  Location: [facility] OR;  Service: Thoracic;  Laterality: Right;   VIDEO ASSISTED THORACOSCOPY (VATS)/WEDGE RESECTION Right 10/05/2014   Procedure: Right VIDEO ASSISTED THORACOSCOPY (VATS) with right lower lobe lung nodule WEDGE RESECTION;  Surgeon: Loreli Slot, MD;  Location: [facility] OR;  Service: Thoracic;  Laterality: Right;   Patient Active Problem List   Diagnosis Date Noted   Sebaceous cyst 06/20/2022   Genetic testing 06/06/2022   Malignant neoplasm of upper-outer quadrant of right breast in female, estrogen receptor positive (HCC) 03/06/2022   Avulsion fracture of left ankle 10/27/2021   Skin lesion 07/05/2021   Insomnia 03/31/2021   Osteoporosis 11/12/2020   Closed fracture of left proximal humerus 09/26/2018   Squamous cell carcinoma 03/05/2017   Echocardiogram shows left ventricular diastolic dysfunction 08/21/2016   Heart palpitations 07/25/2016   Need for vaccination for pneumococcus 07/13/2016   Osteoarthritis of hand, right 08/06/2015   Diabetes mellitus type 2, uncomplicated (HCC) 12/31/2014   Lung cancer, lower lobe (HCC) 10/09/2014   Hematochezia 04/01/2013   H/O colon cancer, stage II 09/19/2011   Depression with anxiety 09/19/2011   Hyperlipidemia 09/19/2011    PCP: Dr  Everrett Coombe  REFERRING PROVIDER: Dr Clementeen Graham   REFERRING DIAG: bilateral leg weakness   THERAPY DIAG:  Muscle weakness (generalized)  Other symptoms and signs involving the musculoskeletal system  Stiffness of both knees  Acute left ankle pain  Abnormal gait  Rationale for Evaluation and Treatment: Rehabilitation  ONSET DATE: 07/20/22  SUBJECTIVE:                                                                                                                                                                                       SUBJECTIVE STATEMENT: Patient reports that she is working on her exercises at home. She is not having pain in bilateral knees but she is sore in the top of her thighs form the exercises. She still feels like they will buckle on her when she is walking/standing but not as often.  Hand dominance: Right  PERTINENT HISTORY: Fx Lt shoulder with ORIF; chronic Rt shoulder pain; HTN; arthritis; osteoporosis; history of cancer colon, lung, breast; fx Lt foot; hx of LBP    PAIN:  Are you having pain? Yes: NPRS scale: 0/10 Pain location:  Pain description:  Aggravating factors:  Relieving factors:   PRECAUTIONS:   WEIGHT BEARING RESTRICTIONS: No  FALLS:  Has patient fallen in last 6 months? No  LIVING ENVIRONMENT: Lives with: lives alone Lives in: House/apartment Stairs: level entry; one level  Has following equipment at home: None  OCCUPATION: Retired; Primary school teacher Activities: household chores; water exercise in summer; travel; dog sitting   PLOF: Independent  PATIENT GOALS: get stronger  NEXT MD VISIT: 01/15/23  OBJECTIVE:   DIAGNOSTIC FINDINGS:  None for knees    POSTURE: Patient presents with head forward posture with increased thoracic kyphosis; shoulders rounded and elevated; scapulae abducted and rotated along the thoracic spine; head of the humerus anterior in orientation; slightly flexed forward at hips   LOWER EXTREMITY ROM:     Active  Right eval Left eval  Hip flexion    Hip extension    Hip abduction    Hip adduction    Hip internal rotation    Hip external rotation    Knee flexion 137 135  Knee extension -10 -4  Ankle dorsiflexion 5 0  Ankle plantarflexion    Ankle inversion    Ankle eversion     (Blank rows = not tested)   LOWER EXTREMITY MMT:    MMT Right eval Left eval  Hip flexion 4+ 4+  Hip extension 4- 4-  Hip abduction 4+ 4+  Hip adduction    Hip internal rotation    Hip external rotation    Knee  flexion 5 5  Knee extension 5 5  Ankle dorsiflexion 4+ 4  Ankle plantarflexion 4 4  Ankle inversion    Ankle eversion     (Blank rows = not tested)   5 times sit to stand: 13.65 sec      [facility] Adult PT Treatment:                                                DATE: 10/10/2022 Therapeutic Exercise: NuStep L6 x 6 min HS stretch sitting 30 sec x 2 Gastroc stretch 30 sec x 2 on slant board Soleus stretch 30 sec x 1 on slant board  SLS 10 sec x 10 Rt/Lt  Step ups 6 inch step Rt/Lt x 20  Trial of lateral heel tap (painful)  Side steps red TB criss crossed 10 ft x 5 Rt/Lt  Sports cord backward; sidesteps Rt/Lt x 10 each  Sit to stand 10 x 2   [facility] Adult PT Treatment:                                                DATE: 09/28/2022 Therapeutic Exercise: NuStep L5 x 5 min Mat Table: Seated HS stretch (heel propped on stool) Small range SLR 2#AW x10 B S/L resisted clamshells RTB x15 S/L straight leg hip abd RTB x15 Seated LAQ 4#AW x10 B Seated knee extension RTB x10 Side stepping RTB crossed at ankles (counter)   PATIENT EDUCATION: Education details: Updated HEP  Person educated: Patient Education method: Programmer, multimedia, Demonstration, Actor cues, Verbal cues, and Handouts Education comprehension: verbalized understanding, returned demonstration, verbal cues required, tactile cues required, and needs further education  HOME EXERCISE PROGRAM: Access Code: [email or web address] URL: [email or web address] Date: 09/28/2022 Prepared by: Carlynn Herald  Exercises - Seated Hamstring Stretch  - 2 x daily - 7 x weekly - 1 sets - 3 reps - 30 sec  hold - Sit to Stand  - 2 x daily - 7 x weekly - 1-2 sets - 10 reps - 3-5 sec  hold - Gastroc Stretch on Wall  - 2 x daily - 7 x weekly - 1 sets - 3 reps - 30 sec  hold - Soleus Stretch on Wall  - 2 x daily - 7 x weekly - 1 sets - 3 reps - 30 sec  hold - Clam with Resistance  - 1 x daily - 7 x weekly - 3 sets - 10 reps - Seated Knee Extension  with Resistance  - 1 x daily - 7 x weekly - 3 sets - 10 reps  ASSESSMENT:  CLINICAL IMPRESSION:  Continued with LE strengthening.   OBJECTIVE IMPAIRMENTS: decreased activity tolerance, decreased mobility, decreased ROM, decreased strength, increased fascial restrictions, increased muscle spasms, impaired flexibility, impaired UE functional use, improper body mechanics, postural dysfunction, and pain.    GOALS: Goals reviewed with patient? Yes  SHORT TERM GOALS: Target date: 10/24/2022  Independent in initial HEP  Baseline: Goal status: INITIAL  2.  Increase Rt knee extension to (-) 5 and Lt knee extension to 0 degrees  Baseline:  Goal status: INITIAL   LONG TERM GOALS: Target date: 11/21/2022  Increase bilat LE strength to 5-/5 to 5/5  Baseline:  Goal status: INITIAL  2.  Increased Rt  knee extension to (-) 2 to 0 degrees  Baseline:  Goal status: INITIAL  3.  Improve times sit to stand to </= 12 sec  Baseline:  Goal status: INITIAL  4.  Patient reports ability to walk for 20 min without sensation of weakness or "leg giving away" Baseline:  Goal status: INITIAL  5.  Independent in HEP including aquatic program  Baseline:  Goal status: INITIAL   PLAN:  PT FREQUENCY: 2x/week  PT DURATION: 8 weeks  PLANNED INTERVENTIONS: Therapeutic exercises, Therapeutic activity, Neuromuscular re-education, Patient/Family education, Self Care, Joint mobilization, Aquatic Therapy, Dry Needling, Electrical stimulation, Spinal mobilization, Cryotherapy, Moist heat, Taping, Vasopneumatic device, Ultrasound, Ionotophoresis 4mg /ml Dexamethasone, Manual therapy, and Re-evaluation  PLAN FOR NEXT SESSION: Progress exercises; continue with postural correction and education; manual work, DN, modalities as indicated   [employer], PT 10/10/2022, 1:16 PM

## 2022-10-12 ENCOUNTER — Ambulatory Visit: Payer: No Typology Code available for payment source | Admitting: Rehabilitative and Restorative Service Providers"

## 2022-10-16 ENCOUNTER — Ambulatory Visit: Payer: No Typology Code available for payment source

## 2022-10-16 DIAGNOSIS — M6281 Muscle weakness (generalized): Secondary | ICD-10-CM | POA: Diagnosis not present

## 2022-10-16 DIAGNOSIS — M25661 Stiffness of right knee, not elsewhere classified: Secondary | ICD-10-CM

## 2022-10-16 DIAGNOSIS — R29898 Other symptoms and signs involving the musculoskeletal system: Secondary | ICD-10-CM

## 2022-10-16 NOTE — Therapy (Signed)
OUTPATIENT PHYSICAL THERAPY LE TREATMENT   Patient Name: Kelli [name] MRN: [id number] DOB:01-16-1946, 77 y.o., female Today's Date: 10/16/2022  END OF SESSION:  PT End of Session - 10/16/22 1316     Visit Number 4    Number of Visits 16    Date for PT Re-Evaluation 11/07/22    Authorization Type [employer] 2024 copay $15    PT Start Time 1316    PT Stop Time 1356    PT Time Calculation (min) 40 min    Activity Tolerance Patient tolerated treatment well    Behavior During Therapy [facility] for tasks assessed/performed             Past Medical History:  Diagnosis Date   Anxiety    Arthritis    Borderline diabetic    Colon cancer (HCC)    colon ca dx 02/2009, lung cancer   Depression    Depression with anxiety 09/19/2011   Diabetes mellitus type 2, uncomplicated (HCC) 12/31/2014   Genetic testing 06/06/2022   H/O colon cancer, stage II 09/19/2011   T3N0  Cecum Sept 2010   High cholesterol    Hyperlipidemia 09/19/2011   Lung nodules 09/19/2011   granulomas   Osteoporosis 2022   Urinary urgency    Past Surgical History:  Procedure Laterality Date   APPENDECTOMY     AUGMENTATION MAMMAPLASTY     BREAST LUMPECTOMY WITH RADIOACTIVE SEED LOCALIZATION Right 03/22/2022   Procedure: RIGHT BREAST LUMPECTOMY WITH RADIOACTIVE SEED LOCALIZATION;  Surgeon: Almond Lint, MD;  Location: [facility];  Service: General;  Laterality: Right;   BREAST SURGERY  1978   augmentation   COLON SURGERY     2010   CRYO INTERCOSTAL NERVE BLOCK N/A 10/05/2014   Procedure: CRYO INTERCOSTAL NERVE BLOCK;  Surgeon: Loreli Slot, MD;  Location: [facility] OR;  Service: Thoracic;  Laterality: N/A;   LYMPH NODE DISSECTION Right 10/05/2014   Procedure: LYMPH NODE DISSECTION;  Surgeon: Loreli Slot, MD;  Location: [facility] OR;  Service: Thoracic;  Laterality: Right;   ORIF HUMERUS FRACTURE Left 10/01/2018   Procedure: OPEN REDUCTION INTERNAL FIXATION (ORIF) LEFT PROXIMAL HUMERUS  FRACTURE;  Surgeon: Cammy Copa, MD;  Location: [facility] OR;  Service: Orthopedics;  Laterality: Left;   PARTIAL COLECTOMY Right 2010   SEGMENTECOMY Right 10/05/2014   Procedure: right lower lobe SEGMENTECTOMY;  Surgeon: Loreli Slot, MD;  Location: [facility] OR;  Service: Thoracic;  Laterality: Right;   VIDEO ASSISTED THORACOSCOPY (VATS)/WEDGE RESECTION Right 10/05/2014   Procedure: Right VIDEO ASSISTED THORACOSCOPY (VATS) with right lower lobe lung nodule WEDGE RESECTION;  Surgeon: Loreli Slot, MD;  Location: [facility] OR;  Service: Thoracic;  Laterality: Right;   Patient Active Problem List   Diagnosis Date Noted   Sebaceous cyst 06/20/2022   Genetic testing 06/06/2022   Malignant neoplasm of upper-outer quadrant of right breast in female, estrogen receptor positive (HCC) 03/06/2022   Avulsion fracture of left ankle 10/27/2021   Skin lesion 07/05/2021   Insomnia 03/31/2021   Osteoporosis 11/12/2020   Closed fracture of left proximal humerus 09/26/2018   Squamous cell carcinoma 03/05/2017   Echocardiogram shows left ventricular diastolic dysfunction 08/21/2016   Heart palpitations 07/25/2016   Need for vaccination for pneumococcus 07/13/2016   Osteoarthritis of hand, right 08/06/2015   Diabetes mellitus type 2, uncomplicated (HCC) 12/31/2014   Lung cancer, lower lobe (HCC) 10/09/2014   Hematochezia 04/01/2013   H/O colon cancer, stage II 09/19/2011   Depression with anxiety  09/19/2011   Hyperlipidemia 09/19/2011    PCP: Dr Everrett Coombe  REFERRING PROVIDER: Dr Clementeen Graham   REFERRING DIAG: bilateral leg weakness   THERAPY DIAG:  Muscle weakness (generalized)  Other symptoms and signs involving the musculoskeletal system  Stiffness of both knees  Rationale for Evaluation and Treatment: Rehabilitation  ONSET DATE: 07/20/22  SUBJECTIVE:                                                                                                                                                                                       SUBJECTIVE STATEMENT: Patient reports that she is working on her exercises at home. She is not having pain in bilateral knees but she is sore in the top of her thighs form the exercises. She still feels like they will buckle on her when she is walking/standing but not as often.  Hand dominance: Right  PERTINENT HISTORY: Fx Lt shoulder with ORIF; chronic Rt shoulder pain; HTN; arthritis; osteoporosis; history of cancer colon, lung, breast; fx Lt foot; hx of LBP    PAIN:  Are you having pain? Yes: NPRS scale: 0/10 Pain location:  Pain description:  Aggravating factors:  Relieving factors:   PRECAUTIONS:   WEIGHT BEARING RESTRICTIONS: No  FALLS:  Has patient fallen in last 6 months? No  LIVING ENVIRONMENT: Lives with: lives alone Lives in: House/apartment Stairs: level entry; one level  Has following equipment at home: None  OCCUPATION: Retired; Primary school teacher Activities: household chores; water exercise in summer; travel; dog sitting   PLOF: Independent  PATIENT GOALS: get stronger  NEXT MD VISIT: 01/15/23  OBJECTIVE:   DIAGNOSTIC FINDINGS:  None for knees    POSTURE: Patient presents with head forward posture with increased thoracic kyphosis; shoulders rounded and elevated; scapulae abducted and rotated along the thoracic spine; head of the humerus anterior in orientation; slightly flexed forward at hips   LOWER EXTREMITY ROM:     Active  Right eval Left eval  Hip flexion    Hip extension    Hip abduction    Hip adduction    Hip internal rotation    Hip external rotation    Knee flexion 137 135  Knee extension -10 -4  Ankle dorsiflexion 5 0  Ankle plantarflexion    Ankle inversion    Ankle eversion     (Blank rows = not tested)   LOWER EXTREMITY MMT:    MMT Right eval Left eval  Hip flexion 4+ 4+  Hip extension 4- 4-  Hip abduction 4+ 4+  Hip adduction    Hip internal rotation    Hip external  rotation    Knee flexion  5 5  Knee extension 5 5  Ankle dorsiflexion 4+ 4  Ankle plantarflexion 4 4  Ankle inversion    Ankle eversion     (Blank rows = not tested)   5 times sit to stand: 13.65 sec      [facility] Adult PT Treatment:                                                DATE: 10/16/2022 Therapeutic Exercise: Interval training NuStep L6<->8 x 5 min Gastroc/soleus stretch on slant board 2x30" Yoga block lateral heel taps --> 4" step heel taps R SL lateral lunges with slider  Standing squats --> squat + lateral leg slider Leg press: DL 60# 4V40 --> SL 98# J19  TKE 5#pulley 10x5" Timed STSx5 (11 sec)    [facility] Adult PT Treatment:                                                DATE: 10/10/2022 Therapeutic Exercise: NuStep L6 x 6 min HS stretch sitting 30 sec x 2 Gastroc stretch 30 sec x 2 on slant board Soleus stretch 30 sec x 1 on slant board  SLS 10 sec x 10 Rt/Lt  Step ups 6 inch step Rt/Lt x 20  Trial of lateral heel tap (painful)  Side steps red TB criss crossed 10 ft x 5 Rt/Lt  Sports cord backward; sidesteps Rt/Lt x 10 each  Sit to stand 10 x 2    [facility] Adult PT Treatment:                                                DATE: 09/28/2022 Therapeutic Exercise: NuStep L5 x 5 min Mat Table: Seated HS stretch (heel propped on stool) Small range SLR 2#AW x10 B S/L resisted clamshells RTB x15 S/L straight leg hip abd RTB x15 Seated LAQ 4#AW x10 B Seated knee extension RTB x10 Side stepping RTB crossed at ankles (counter)   PATIENT EDUCATION: Education details: Updated HEP  Person educated: Patient Education method: Programmer, multimedia, Demonstration, Actor cues, Verbal cues, and Handouts Education comprehension: verbalized understanding, returned demonstration, verbal cues required, tactile cues required, and needs further education  HOME EXERCISE PROGRAM: Access Code: [email or web address] URL: [email or web address] Date: 09/28/2022 Prepared by: Carlynn Herald  Exercises - Seated Hamstring Stretch  - 2 x daily - 7 x weekly - 1 sets - 3 reps - 30 sec  hold - Sit to Stand  - 2 x daily - 7 x weekly - 1-2 sets - 10 reps - 3-5 sec  hold - Gastroc Stretch on Wall  - 2 x daily - 7 x weekly - 1 sets - 3 reps - 30 sec  hold - Soleus Stretch on Wall  - 2 x daily - 7 x weekly - 1 sets - 3 reps - 30 sec  hold - Clam with Resistance  - 1 x daily - 7 x weekly - 3 sets - 10 reps - Seated Knee Extension with Resistance  - 1 x daily - 7 x weekly - 3 sets - 10 reps  ASSESSMENT:  CLINICAL IMPRESSION:  Tactile spotting at knee decreased anterior knee pain and improved posterior weigh shifting during lateral heel taps on 4" step. R anterior knee pain reported during resisted TKE.   OBJECTIVE IMPAIRMENTS: decreased activity tolerance, decreased mobility, decreased ROM, decreased strength, increased fascial restrictions, increased muscle spasms, impaired flexibility, impaired UE functional use, improper body mechanics, postural dysfunction, and pain.    GOALS: Goals reviewed with patient? Yes  SHORT TERM GOALS: Target date: 10/24/2022  Independent in initial HEP  Baseline: Goal status: INITIAL  2.  Increase Rt knee extension to (-) 5 and Lt knee extension to 0 degrees  Baseline:  Goal status: INITIAL   LONG TERM GOALS: Target date: 11/21/2022  Increase bilat LE strength to 5-/5 to 5/5  Baseline:  Goal status: INITIAL  2.  Increased Rt knee extension to (-) 2 to 0 degrees  Baseline:  Goal status: INITIAL  3.  Improve times sit to stand to </= 12 sec  Baseline:  Goal status: MET (11 sec)  4.  Patient reports ability to walk for 20 min without sensation of weakness or "leg giving away" Baseline:  Goal status: MET  5.  Independent in HEP including aquatic program  Baseline:  Goal status: INITIAL   PLAN:  PT FREQUENCY: 2x/week  PT DURATION: 8 weeks  PLANNED INTERVENTIONS: Therapeutic exercises, Therapeutic activity, Neuromuscular  re-education, Patient/Family education, Self Care, Joint mobilization, Aquatic Therapy, Dry Needling, Electrical stimulation, Spinal mobilization, Cryotherapy, Moist heat, Taping, Vasopneumatic device, Ultrasound, Ionotophoresis 4mg /ml Dexamethasone, Manual therapy, and Re-evaluation  PLAN FOR NEXT SESSION: Progress exercises; continue with postural correction and education; manual work, DN, modalities as indicated. STG review 5/21.   Sanjuana Mae, PTA 10/16/2022, 1:56 PM

## 2022-10-17 DIAGNOSIS — H25811 Combined forms of age-related cataract, right eye: Secondary | ICD-10-CM | POA: Diagnosis not present

## 2022-10-17 DIAGNOSIS — H268 Other specified cataract: Secondary | ICD-10-CM | POA: Diagnosis not present

## 2022-10-19 ENCOUNTER — Ambulatory Visit: Payer: No Typology Code available for payment source

## 2022-10-19 DIAGNOSIS — M6281 Muscle weakness (generalized): Secondary | ICD-10-CM

## 2022-10-19 DIAGNOSIS — R29898 Other symptoms and signs involving the musculoskeletal system: Secondary | ICD-10-CM

## 2022-10-19 DIAGNOSIS — M25661 Stiffness of right knee, not elsewhere classified: Secondary | ICD-10-CM

## 2022-10-19 NOTE — Therapy (Signed)
OUTPATIENT PHYSICAL THERAPY LE TREATMENT   Patient Name: Kelli HALSELL MRN: [id number] DOB:04/04/1946, 77 y.o., female Today's Date: 10/19/2022  END OF SESSION:  PT End of Session - 10/19/22 1536     Visit Number 5    Number of Visits 16    Date for PT Re-Evaluation 11/07/22    Authorization Type [employer] 2024 copay $15    PT Start Time 1535    PT Stop Time 1615    PT Time Calculation (min) 40 min    Activity Tolerance Patient tolerated treatment well    Behavior During Therapy [facility] for tasks assessed/performed             Past Medical History:  Diagnosis Date   Anxiety    Arthritis    Borderline diabetic    Colon cancer (HCC)    colon ca dx 02/2009, lung cancer   Depression    Depression with anxiety 09/19/2011   Diabetes mellitus type 2, uncomplicated (HCC) 12/31/2014   Genetic testing 06/06/2022   H/O colon cancer, stage II 09/19/2011   T3N0  Cecum Sept 2010   High cholesterol    Hyperlipidemia 09/19/2011   Lung nodules 09/19/2011   granulomas   Osteoporosis 2022   Urinary urgency    Past Surgical History:  Procedure Laterality Date   APPENDECTOMY     AUGMENTATION MAMMAPLASTY     BREAST LUMPECTOMY WITH RADIOACTIVE SEED LOCALIZATION Right 03/22/2022   Procedure: RIGHT BREAST LUMPECTOMY WITH RADIOACTIVE SEED LOCALIZATION;  Surgeon: Almond Lint, MD;  Location: [facility];  Service: General;  Laterality: Right;   BREAST SURGERY  1978   augmentation   COLON SURGERY     2010   CRYO INTERCOSTAL NERVE BLOCK N/A 10/05/2014   Procedure: CRYO INTERCOSTAL NERVE BLOCK;  Surgeon: Loreli Slot, MD;  Location: [facility] OR;  Service: Thoracic;  Laterality: N/A;   LYMPH NODE DISSECTION Right 10/05/2014   Procedure: LYMPH NODE DISSECTION;  Surgeon: Loreli Slot, MD;  Location: [facility] OR;  Service: Thoracic;  Laterality: Right;   ORIF HUMERUS FRACTURE Left 10/01/2018   Procedure: OPEN REDUCTION INTERNAL FIXATION (ORIF) LEFT PROXIMAL HUMERUS  FRACTURE;  Surgeon: Cammy Copa, MD;  Location: [facility] OR;  Service: Orthopedics;  Laterality: Left;   PARTIAL COLECTOMY Right 2010   SEGMENTECOMY Right 10/05/2014   Procedure: right lower lobe SEGMENTECTOMY;  Surgeon: Loreli Slot, MD;  Location: [facility] OR;  Service: Thoracic;  Laterality: Right;   VIDEO ASSISTED THORACOSCOPY (VATS)/WEDGE RESECTION Right 10/05/2014   Procedure: Right VIDEO ASSISTED THORACOSCOPY (VATS) with right lower lobe lung nodule WEDGE RESECTION;  Surgeon: Loreli Slot, MD;  Location: [facility] OR;  Service: Thoracic;  Laterality: Right;   Patient Active Problem List   Diagnosis Date Noted   Sebaceous cyst 06/20/2022   Genetic testing 06/06/2022   Malignant neoplasm of upper-outer quadrant of right breast in female, estrogen receptor positive (HCC) 03/06/2022   Avulsion fracture of left ankle 10/27/2021   Skin lesion 07/05/2021   Insomnia 03/31/2021   Osteoporosis 11/12/2020   Closed fracture of left proximal humerus 09/26/2018   Squamous cell carcinoma 03/05/2017   Echocardiogram shows left ventricular diastolic dysfunction 08/21/2016   Heart palpitations 07/25/2016   Need for vaccination for pneumococcus 07/13/2016   Osteoarthritis of hand, right 08/06/2015   Diabetes mellitus type 2, uncomplicated (HCC) 12/31/2014   Lung cancer, lower lobe (HCC) 10/09/2014   Hematochezia 04/01/2013   H/O colon cancer, stage II 09/19/2011   Depression with anxiety  09/19/2011   Hyperlipidemia 09/19/2011    PCP: Dr Everrett Coombe  REFERRING PROVIDER: Dr Clementeen Graham   REFERRING DIAG: bilateral leg weakness   THERAPY DIAG:  Muscle weakness (generalized)  Other symptoms and signs involving the musculoskeletal system  Stiffness of both knees  Rationale for Evaluation and Treatment: Rehabilitation  ONSET DATE: 07/20/22  SUBJECTIVE:                                                                                                                                                                                       SUBJECTIVE STATEMENT: Patient reports her knees are feeling better, reports no pain today.  Hand dominance: Right  PERTINENT HISTORY: Fx Lt shoulder with ORIF; chronic Rt shoulder pain; HTN; arthritis; osteoporosis; history of cancer colon, lung, breast; fx Lt foot; hx of LBP    PAIN:  Are you having pain? Yes: NPRS scale: 0/10 Pain location:  Pain description:  Aggravating factors:  Relieving factors:   PRECAUTIONS:   WEIGHT BEARING RESTRICTIONS: No  FALLS:  Has patient fallen in last 6 months? No  LIVING ENVIRONMENT: Lives with: lives alone Lives in: House/apartment Stairs: level entry; one level  Has following equipment at home: None  OCCUPATION: Retired; Primary school teacher Activities: household chores; water exercise in summer; travel; dog sitting   PLOF: Independent  PATIENT GOALS: get stronger  NEXT MD VISIT: 01/15/23  OBJECTIVE:   DIAGNOSTIC FINDINGS:  None for knees    POSTURE: Patient presents with head forward posture with increased thoracic kyphosis; shoulders rounded and elevated; scapulae abducted and rotated along the thoracic spine; head of the humerus anterior in orientation; slightly flexed forward at hips   LOWER EXTREMITY ROM:     Active  Right eval Left eval  Hip flexion    Hip extension    Hip abduction    Hip adduction    Hip internal rotation    Hip external rotation    Knee flexion 137 135  Knee extension -10 -4  Ankle dorsiflexion 5 0  Ankle plantarflexion    Ankle inversion    Ankle eversion     (Blank rows = not tested)   LOWER EXTREMITY MMT:    MMT Right eval Left eval  Hip flexion 4+ 4+  Hip extension 4- 4-  Hip abduction 4+ 4+  Hip adduction    Hip internal rotation    Hip external rotation    Knee flexion 5 5  Knee extension 5 5  Ankle dorsiflexion 4+ 4  Ankle plantarflexion 4 4  Ankle inversion    Ankle eversion     (Blank rows = not tested)   5 times sit  to  stand: 13.65 sec    [facility] Adult PT Treatment:                                                DATE: 10/19/2022 Therapeutic Exercise: Treadmill warm-up 1.7 mph, 0% incline x 5 min Slant board gastroc/soleus stretch 4" step heel taps Standing TKE GTB Leg press: DL 16# X09, 60# A54 --> SL 45# x15 Resisted fwd/bkwd walking 10# pulley --> side stepping 5# pulley      [facility] Adult PT Treatment:                                                DATE: 10/16/2022 Therapeutic Exercise: Interval training NuStep L6<->8 x 5 min Gastroc/soleus stretch on slant board 2x30" Yoga block lateral heel taps --> 4" step heel taps R SL lateral lunges with slider  Standing squats --> squat + lateral leg slider Leg press: DL 09# 8J19 --> SL 14# N82  TKE 5#pulley 10x5" Timed STSx5 (11 sec)   PATIENT EDUCATION: Education details: Updated HEP  Person educated: Patient Education method: Explanation, Demonstration, Tactile cues, Verbal cues, and Handouts Education comprehension: verbalized understanding, returned demonstration, verbal cues required, tactile cues required, and needs further education  HOME EXERCISE PROGRAM: Access Code: [email or web address] URL: [email or web address] Date: 09/28/2022 Prepared by: Carlynn Herald  Exercises - Seated Hamstring Stretch  - 2 x daily - 7 x weekly - 1 sets - 3 reps - 30 sec  hold - Sit to Stand  - 2 x daily - 7 x weekly - 1-2 sets - 10 reps - 3-5 sec  hold - Gastroc Stretch on Wall  - 2 x daily - 7 x weekly - 1 sets - 3 reps - 30 sec  hold - Soleus Stretch on Wall  - 2 x daily - 7 x weekly - 1 sets - 3 reps - 30 sec  hold - Clam with Resistance  - 1 x daily - 7 x weekly - 3 sets - 10 reps - Seated Knee Extension with Resistance  - 1 x daily - 7 x weekly - 3 sets - 10 reps  ASSESSMENT:  CLINICAL IMPRESSION:  Resisted walking performed in all planes performed; initial postural instability with forward stepping but improved with repetition. Resistance increased on  leg press with good tolerance from patient. Hands-on assist decreased anterior knee pain during lateral step downs.    OBJECTIVE IMPAIRMENTS: decreased activity tolerance, decreased mobility, decreased ROM, decreased strength, increased fascial restrictions, increased muscle spasms, impaired flexibility, impaired UE functional use, improper body mechanics, postural dysfunction, and pain.    GOALS: Goals reviewed with patient? Yes  SHORT TERM GOALS: Target date: 10/24/2022  Independent in initial HEP  Goal status: MET  2.  Increase Rt knee extension to (-) 5 and Lt knee extension to 0 degrees  Baseline:  Goal status: INITIAL   LONG TERM GOALS: Target date: 11/21/2022  Increase bilat LE strength to 5-/5 to 5/5  Baseline:  Goal status: INITIAL  2.  Increased Rt knee extension to (-) 2 to 0 degrees  Baseline:  Goal status: INITIAL  3.  Improve times sit to stand to </= 12 sec  Baseline:  Goal status: MET (11 sec)  4.  Patient reports ability to walk for 20 min without sensation of weakness or "leg giving away" Baseline:  Goal status: MET  5.  Independent in HEP including aquatic program  Baseline:  Goal status: INITIAL   PLAN:  PT FREQUENCY: 2x/week  PT DURATION: 8 weeks  PLANNED INTERVENTIONS: Therapeutic exercises, Therapeutic activity, Neuromuscular re-education, Patient/Family education, Self Care, Joint mobilization, Aquatic Therapy, Dry Needling, Electrical stimulation, Spinal mobilization, Cryotherapy, Moist heat, Taping, Vasopneumatic device, Ultrasound, Ionotophoresis 4mg /ml Dexamethasone, Manual therapy, and Re-evaluation  PLAN FOR NEXT SESSION: Progress exercises; continue with postural correction and education; manual work, DN, modalities as indicated. STG review 5/21.   Sanjuana Mae, PTA 10/19/2022, 4:18 PM

## 2022-10-23 ENCOUNTER — Ambulatory Visit: Payer: No Typology Code available for payment source

## 2022-10-23 DIAGNOSIS — R29898 Other symptoms and signs involving the musculoskeletal system: Secondary | ICD-10-CM

## 2022-10-23 DIAGNOSIS — M25661 Stiffness of right knee, not elsewhere classified: Secondary | ICD-10-CM

## 2022-10-23 DIAGNOSIS — M6281 Muscle weakness (generalized): Secondary | ICD-10-CM | POA: Diagnosis not present

## 2022-10-23 NOTE — Therapy (Addendum)
OUTPATIENT PHYSICAL THERAPY LE TREATMENT   Patient Name: Kelli [name] MRN: [id number] DOB:09/29/1945, 77 y.o., female Today's Date: 10/23/2022  END OF SESSION:  PT End of Session - 10/23/22 1434     Visit Number 6    Number of Visits 16    Date for PT Re-Evaluation 11/21/22    Authorization Type [employer] 2024 copay $15    PT Start Time 1430   arrived 30 min late   PT Stop Time 1445    PT Time Calculation (min) 15 min    Activity Tolerance Patient tolerated treatment well    Behavior During Therapy [facility] for tasks assessed/performed             Past Medical History:  Diagnosis Date   Anxiety    Arthritis    Borderline diabetic    Colon cancer (HCC)    colon ca dx 02/2009, lung cancer   Depression    Depression with anxiety 09/19/2011   Diabetes mellitus type 2, uncomplicated (HCC) 12/31/2014   Genetic testing 06/06/2022   H/O colon cancer, stage II 09/19/2011   T3N0  Cecum Sept 2010   High cholesterol    Hyperlipidemia 09/19/2011   Lung nodules 09/19/2011   granulomas   Osteoporosis 2022   Urinary urgency    Past Surgical History:  Procedure Laterality Date   APPENDECTOMY     AUGMENTATION MAMMAPLASTY     BREAST LUMPECTOMY WITH RADIOACTIVE SEED LOCALIZATION Right 03/22/2022   Procedure: RIGHT BREAST LUMPECTOMY WITH RADIOACTIVE SEED LOCALIZATION;  Surgeon: Almond Lint, MD;  Location: [facility];  Service: General;  Laterality: Right;   BREAST SURGERY  1978   augmentation   COLON SURGERY     2010   CRYO INTERCOSTAL NERVE BLOCK N/A 10/05/2014   Procedure: CRYO INTERCOSTAL NERVE BLOCK;  Surgeon: Loreli Slot, MD;  Location: [facility] OR;  Service: Thoracic;  Laterality: N/A;   LYMPH NODE DISSECTION Right 10/05/2014   Procedure: LYMPH NODE DISSECTION;  Surgeon: Loreli Slot, MD;  Location: [facility] OR;  Service: Thoracic;  Laterality: Right;   ORIF HUMERUS FRACTURE Left 10/01/2018   Procedure: OPEN REDUCTION INTERNAL FIXATION (ORIF) LEFT  PROXIMAL HUMERUS FRACTURE;  Surgeon: Cammy Copa, MD;  Location: [facility] OR;  Service: Orthopedics;  Laterality: Left;   PARTIAL COLECTOMY Right 2010   SEGMENTECOMY Right 10/05/2014   Procedure: right lower lobe SEGMENTECTOMY;  Surgeon: Loreli Slot, MD;  Location: [facility] OR;  Service: Thoracic;  Laterality: Right;   VIDEO ASSISTED THORACOSCOPY (VATS)/WEDGE RESECTION Right 10/05/2014   Procedure: Right VIDEO ASSISTED THORACOSCOPY (VATS) with right lower lobe lung nodule WEDGE RESECTION;  Surgeon: Loreli Slot, MD;  Location: [facility] OR;  Service: Thoracic;  Laterality: Right;   Patient Active Problem List   Diagnosis Date Noted   Sebaceous cyst 06/20/2022   Genetic testing 06/06/2022   Malignant neoplasm of upper-outer quadrant of right breast in female, estrogen receptor positive (HCC) 03/06/2022   Avulsion fracture of left ankle 10/27/2021   Skin lesion 07/05/2021   Insomnia 03/31/2021   Osteoporosis 11/12/2020   Closed fracture of left proximal humerus 09/26/2018   Squamous cell carcinoma 03/05/2017   Echocardiogram shows left ventricular diastolic dysfunction 08/21/2016   Heart palpitations 07/25/2016   Need for vaccination for pneumococcus 07/13/2016   Osteoarthritis of hand, right 08/06/2015   Diabetes mellitus type 2, uncomplicated (HCC) 12/31/2014   Lung cancer, lower lobe (HCC) 10/09/2014   Hematochezia 04/01/2013   H/O colon cancer, stage II 09/19/2011  Depression with anxiety 09/19/2011   Hyperlipidemia 09/19/2011    PCP: Dr Everrett Coombe  REFERRING PROVIDER: Dr Clementeen Graham   REFERRING DIAG: bilateral leg weakness   THERAPY DIAG:  Muscle weakness (generalized)  Other symptoms and signs involving the musculoskeletal system  Stiffness of both knees  Rationale for Evaluation and Treatment: Rehabilitation  ONSET DATE: 07/20/22  SUBJECTIVE:                                                                                                                                                                                       SUBJECTIVE STATEMENT: Patient reports her knee has been buckling occasionally but no pain. Hand dominance: Right  PERTINENT HISTORY: Fx Lt shoulder with ORIF; chronic Rt shoulder pain; HTN; arthritis; osteoporosis; history of cancer colon, lung, breast; fx Lt foot; hx of LBP    PAIN:  Are you having pain? Yes: NPRS scale: 0/10 Pain location:  Pain description:  Aggravating factors:  Relieving factors:   PRECAUTIONS:   WEIGHT BEARING RESTRICTIONS: No  FALLS:  Has patient fallen in last 6 months? No  LIVING ENVIRONMENT: Lives with: lives alone Lives in: House/apartment Stairs: level entry; one level  Has following equipment at home: None  OCCUPATION: Retired; Primary school teacher Activities: household chores; water exercise in summer; travel; dog sitting   PLOF: Independent  PATIENT GOALS: get stronger  NEXT MD VISIT: 01/15/23  OBJECTIVE:   DIAGNOSTIC FINDINGS:  None for knees    POSTURE: Patient presents with head forward posture with increased thoracic kyphosis; shoulders rounded and elevated; scapulae abducted and rotated along the thoracic spine; head of the humerus anterior in orientation; slightly flexed forward at hips   LOWER EXTREMITY ROM:     Active  Right eval Left eval Right 10/23/22 Left 10/23/22  Hip flexion      Hip extension      Hip abduction      Hip adduction      Hip internal rotation      Hip external rotation      Knee flexion 137 135    Knee extension -10 -4 -3 0  Ankle dorsiflexion 5 0    Ankle plantarflexion      Ankle inversion      Ankle eversion       (Blank rows = not tested)   LOWER EXTREMITY MMT:    MMT Right eval Left eval  Hip flexion 4+ 4+  Hip extension 4- 4-  Hip abduction 4+ 4+  Hip adduction    Hip internal rotation    Hip external rotation    Knee flexion 5 5  Knee extension 5 5  Ankle  dorsiflexion 4+ 4  Ankle plantarflexion 4 4  Ankle  inversion    Ankle eversion     (Blank rows = not tested)   5 times sit to stand: 13.65 sec    [facility] Adult PT Treatment:                                                DATE: 10/23/2022 Therapeutic Exercise: Standing resisted hip abd RTB crossed at ankles Standing 3-way hip (flex, abd, ext toe taps) RTB Runner's lunge stretch Knee extension measurements    [facility] Adult PT Treatment:                                                DATE: 10/19/2022 Therapeutic Exercise: Treadmill warm-up 1.7 mph, 0% incline x 5 min Slant board gastroc/soleus stretch 4" step heel taps Standing TKE GTB Leg press: DL 30# Q65, 78# I69 --> SL 45# x15 Resisted fwd/bkwd walking 10# pulley --> side stepping 5# pulley      [facility] Adult PT Treatment:                                                DATE: 10/16/2022 Therapeutic Exercise: Interval training NuStep L6<->8 x 5 min Gastroc/soleus stretch on slant board 2x30" Yoga block lateral heel taps --> 4" step heel taps R SL lateral lunges with slider  Standing squats --> squat + lateral leg slider Leg press: DL 62# 9B28 --> SL 41# L24  TKE 5#pulley 10x5" Timed STSx5 (11 sec)   PATIENT EDUCATION: Education details: Updated HEP  Person educated: Patient Education method: Explanation, Demonstration, Tactile cues, Verbal cues, and Handouts Education comprehension: verbalized understanding, returned demonstration, verbal cues required, tactile cues required, and needs further education  HOME EXERCISE PROGRAM: Access Code: [email or web address] URL: [email or web address] Date: 09/28/2022 Prepared by: Carlynn Herald  Exercises - Seated Hamstring Stretch  - 2 x daily - 7 x weekly - 1 sets - 3 reps - 30 sec  hold - Sit to Stand  - 2 x daily - 7 x weekly - 1-2 sets - 10 reps - 3-5 sec  hold - Gastroc Stretch on Wall  - 2 x daily - 7 x weekly - 1 sets - 3 reps - 30 sec  hold - Soleus Stretch on Wall  - 2 x daily - 7 x weekly - 1 sets - 3 reps - 30 sec  hold -  Clam with Resistance  - 1 x daily - 7 x weekly - 3 sets - 10 reps - Seated Knee Extension with Resistance  - 1 x daily - 7 x weekly - 3 sets - 10 reps  ASSESSMENT:  CLINICAL IMPRESSION:  Patient arrived 30 minutes late but treated for remainder of session. Standing hip strengthening performed in al directions.    OBJECTIVE IMPAIRMENTS: decreased activity tolerance, decreased mobility, decreased ROM, decreased strength, increased fascial restrictions, increased muscle spasms, impaired flexibility, impaired UE functional use, improper body mechanics, postural dysfunction, and pain.    GOALS: Goals reviewed with patient? Yes  SHORT TERM GOALS: Target date: 10/24/2022  Independent in  initial HEP  Goal status: MET  2.  Increase Rt knee extension to (-) 5 and Lt knee extension to 0 degrees  Baseline:  Goal status: MET   LONG TERM GOALS: Target date: 11/21/2022  Increase bilat LE strength to 5-/5 to 5/5  Baseline:  Goal status: INITIAL  2.  Increased Rt knee extension to (-) 2 to 0 degrees  Baseline:  Goal status: INITIAL  3.  Improve times sit to stand to </= 12 sec  Baseline:  Goal status: MET (11 sec)  4.  Patient reports ability to walk for 20 min without sensation of weakness or "leg giving away" Baseline:  Goal status: MET  5.  Independent in HEP including aquatic program  Baseline:  Goal status: INITIAL   PLAN:  PT FREQUENCY: 2x/week  PT DURATION: 8 weeks  PLANNED INTERVENTIONS: Therapeutic exercises, Therapeutic activity, Neuromuscular re-education, Patient/Family education, Self Care, Joint mobilization, Aquatic Therapy, Dry Needling, Electrical stimulation, Spinal mobilization, Cryotherapy, Moist heat, Taping, Vasopneumatic device, Ultrasound, Ionotophoresis 4mg /ml Dexamethasone, Manual therapy, and Re-evaluation  PLAN FOR NEXT SESSION: Progress exercises; continue with postural correction and education; manual work, DN, modalities as indicated.    Sanjuana Mae, PTA 10/23/2022, 4:30 PM

## 2022-10-23 NOTE — Addendum Note (Signed)
Addended by: Val Riles on: 10/23/2022 12:04 PM   Modules accepted: Orders

## 2022-10-24 ENCOUNTER — Ambulatory Visit: Payer: No Typology Code available for payment source | Admitting: Oncology

## 2022-10-26 ENCOUNTER — Ambulatory Visit: Payer: No Typology Code available for payment source

## 2022-10-26 DIAGNOSIS — M25661 Stiffness of right knee, not elsewhere classified: Secondary | ICD-10-CM

## 2022-10-26 DIAGNOSIS — R29898 Other symptoms and signs involving the musculoskeletal system: Secondary | ICD-10-CM

## 2022-10-26 DIAGNOSIS — M6281 Muscle weakness (generalized): Secondary | ICD-10-CM

## 2022-10-26 NOTE — Therapy (Signed)
OUTPATIENT PHYSICAL THERAPY LE TREATMENT   Patient Name: Kelli [name] MRN: [id number] DOB:1946/01/11, 77 y.o., female Today's Date: 10/26/2022  END OF SESSION:  PT End of Session - 10/26/22 1526     Visit Number 7    Number of Visits 16    Date for PT Re-Evaluation 11/21/22    Authorization Type [employer] 2024 copay $15    PT Start Time 1527    PT Stop Time 1605    PT Time Calculation (min) 38 min    Activity Tolerance Patient tolerated treatment well    Behavior During Therapy [facility] for tasks assessed/performed             Past Medical History:  Diagnosis Date   Anxiety    Arthritis    Borderline diabetic    Colon cancer (HCC)    colon ca dx 02/2009, lung cancer   Depression    Depression with anxiety 09/19/2011   Diabetes mellitus type 2, uncomplicated (HCC) 12/31/2014   Genetic testing 06/06/2022   H/O colon cancer, stage II 09/19/2011   T3N0  Cecum Sept 2010   High cholesterol    Hyperlipidemia 09/19/2011   Lung nodules 09/19/2011   granulomas   Osteoporosis 2022   Urinary urgency    Past Surgical History:  Procedure Laterality Date   APPENDECTOMY     AUGMENTATION MAMMAPLASTY     BREAST LUMPECTOMY WITH RADIOACTIVE SEED LOCALIZATION Right 03/22/2022   Procedure: RIGHT BREAST LUMPECTOMY WITH RADIOACTIVE SEED LOCALIZATION;  Surgeon: Almond Lint, MD;  Location: [facility];  Service: General;  Laterality: Right;   BREAST SURGERY  1978   augmentation   COLON SURGERY     2010   CRYO INTERCOSTAL NERVE BLOCK N/A 10/05/2014   Procedure: CRYO INTERCOSTAL NERVE BLOCK;  Surgeon: Loreli Slot, MD;  Location: [facility] OR;  Service: Thoracic;  Laterality: N/A;   LYMPH NODE DISSECTION Right 10/05/2014   Procedure: LYMPH NODE DISSECTION;  Surgeon: Loreli Slot, MD;  Location: [facility] OR;  Service: Thoracic;  Laterality: Right;   ORIF HUMERUS FRACTURE Left 10/01/2018   Procedure: OPEN REDUCTION INTERNAL FIXATION (ORIF) LEFT PROXIMAL HUMERUS  FRACTURE;  Surgeon: Cammy Copa, MD;  Location: [facility] OR;  Service: Orthopedics;  Laterality: Left;   PARTIAL COLECTOMY Right 2010   SEGMENTECOMY Right 10/05/2014   Procedure: right lower lobe SEGMENTECTOMY;  Surgeon: Loreli Slot, MD;  Location: [facility] OR;  Service: Thoracic;  Laterality: Right;   VIDEO ASSISTED THORACOSCOPY (VATS)/WEDGE RESECTION Right 10/05/2014   Procedure: Right VIDEO ASSISTED THORACOSCOPY (VATS) with right lower lobe lung nodule WEDGE RESECTION;  Surgeon: Loreli Slot, MD;  Location: [facility] OR;  Service: Thoracic;  Laterality: Right;   Patient Active Problem List   Diagnosis Date Noted   Sebaceous cyst 06/20/2022   Genetic testing 06/06/2022   Malignant neoplasm of upper-outer quadrant of right breast in female, estrogen receptor positive (HCC) 03/06/2022   Avulsion fracture of left ankle 10/27/2021   Skin lesion 07/05/2021   Insomnia 03/31/2021   Osteoporosis 11/12/2020   Closed fracture of left proximal humerus 09/26/2018   Squamous cell carcinoma 03/05/2017   Echocardiogram shows left ventricular diastolic dysfunction 08/21/2016   Heart palpitations 07/25/2016   Need for vaccination for pneumococcus 07/13/2016   Osteoarthritis of hand, right 08/06/2015   Diabetes mellitus type 2, uncomplicated (HCC) 12/31/2014   Lung cancer, lower lobe (HCC) 10/09/2014   Hematochezia 04/01/2013   H/O colon cancer, stage II 09/19/2011   Depression with anxiety  09/19/2011   Hyperlipidemia 09/19/2011    PCP: Dr Everrett Coombe  REFERRING PROVIDER: Dr Clementeen Graham   REFERRING DIAG: bilateral leg weakness   THERAPY DIAG:  Muscle weakness (generalized)  Other symptoms and signs involving the musculoskeletal system  Stiffness of both knees  Rationale for Evaluation and Treatment: Rehabilitation  ONSET DATE: 07/20/22  SUBJECTIVE:                                                                                                                                                                                       SUBJECTIVE STATEMENT: Patient reports both her knees are buckling more often, states she feels her knees are getting stronger despite the buckling.   Hand dominance: Right  PERTINENT HISTORY: Fx Lt shoulder with ORIF; chronic Rt shoulder pain; HTN; arthritis; osteoporosis; history of cancer colon, lung, breast; fx Lt foot; hx of LBP    PAIN:  Are you having pain? Yes: NPRS scale: 0/10 Pain location:  Pain description:  Aggravating factors:  Relieving factors:   PRECAUTIONS:   WEIGHT BEARING RESTRICTIONS: No  FALLS:  Has patient fallen in last 6 months? No  LIVING ENVIRONMENT: Lives with: lives alone Lives in: House/apartment Stairs: level entry; one level  Has following equipment at home: None  OCCUPATION: Retired; Primary school teacher Activities: household chores; water exercise in summer; travel; dog sitting   PLOF: Independent  PATIENT GOALS: get stronger  NEXT MD VISIT: 01/15/23  OBJECTIVE:   DIAGNOSTIC FINDINGS:  None for knees    POSTURE: Patient presents with head forward posture with increased thoracic kyphosis; shoulders rounded and elevated; scapulae abducted and rotated along the thoracic spine; head of the humerus anterior in orientation; slightly flexed forward at hips   LOWER EXTREMITY ROM:     Active  Right eval Left eval Right 10/23/22 Left 10/23/22  Hip flexion      Hip extension      Hip abduction      Hip adduction      Hip internal rotation      Hip external rotation      Knee flexion 137 135    Knee extension -10 -4 -3 0  Ankle dorsiflexion 5 0    Ankle plantarflexion      Ankle inversion      Ankle eversion       (Blank rows = not tested)   LOWER EXTREMITY MMT:    MMT Right eval Left eval  Hip flexion 4+ 4+  Hip extension 4- 4-  Hip abduction 4+ 4+  Hip adduction    Hip internal rotation    Hip external rotation    Knee flexion 5  5  Knee extension 5 5  Ankle dorsiflexion 4+  4  Ankle plantarflexion 4 4  Ankle inversion    Ankle eversion     (Blank rows = not tested)   5 times sit to stand: 13.65 sec    [facility] Adult PT Treatment:                                                DATE: 10/26/2022 Therapeutic Exercise: Gastroc stretch on slant board 3x30" 4#AW SAQ green bolster 10x5" (B) SLR small range: parallel/ER/IR 10x5" each  5#AW LAQ 10x5" Standing squats --> cradling 10#KB Leg press: DL 16# --> ball b/w knees    [facility] Adult PT Treatment:                                                DATE: 10/23/2022 Therapeutic Exercise: Standing resisted hip abd RTB crossed at ankles Standing 3-way hip (flex, abd, ext toe taps) RTB Runner's lunge stretch Knee extension measurements    [facility] Adult PT Treatment:                                                DATE: 10/19/2022 Therapeutic Exercise: Treadmill warm-up 1.7 mph, 0% incline x 5 min Slant board gastroc/soleus stretch 4" step heel taps Standing TKE GTB Leg press: DL 10# R60, 45# W09 --> SL 45# x15 Resisted fwd/bkwd walking 10# pulley --> side stepping 5# pulley     PATIENT EDUCATION: Education details: Updated HEP  Person educated: Patient Education method: Explanation, Demonstration, Tactile cues, Verbal cues, and Handouts Education comprehension: verbalized understanding, returned demonstration, verbal cues required, tactile cues required, and needs further education  HOME EXERCISE PROGRAM: Access Code: [email or web address] URL: [email or web address] Date: 09/28/2022 Prepared by: Carlynn Herald  Exercises - Seated Hamstring Stretch  - 2 x daily - 7 x weekly - 1 sets - 3 reps - 30 sec  hold - Sit to Stand  - 2 x daily - 7 x weekly - 1-2 sets - 10 reps - 3-5 sec  hold - Gastroc Stretch on Wall  - 2 x daily - 7 x weekly - 1 sets - 3 reps - 30 sec  hold - Soleus Stretch on Wall  - 2 x daily - 7 x weekly - 1 sets - 3 reps - 30 sec  hold - Clam with Resistance  - 1 x daily - 7 x weekly - 3 sets -  10 reps - Seated Knee Extension with Resistance  - 1 x daily - 7 x weekly - 3 sets - 10 reps  ASSESSMENT:  CLINICAL IMPRESSION:  Focus on quad strengthening to address subjective symptoms. Initial weakness noted during single leg raises in external rotation on right leg. Popping in bilateral knees during leg press; placing ball between knees for hip add isometric alleviated popping and improved knee discomfort and stability.   OBJECTIVE IMPAIRMENTS: decreased activity tolerance, decreased mobility, decreased ROM, decreased strength, increased fascial restrictions, increased muscle spasms, impaired flexibility, impaired UE functional use, improper body mechanics, postural dysfunction, and pain.    GOALS: Goals reviewed  with patient? Yes  SHORT TERM GOALS: Target date: 10/24/2022  Independent in initial HEP  Goal status: MET  2.  Increase Rt knee extension to (-) 5 and Lt knee extension to 0 degrees  Baseline:  Goal status: MET   LONG TERM GOALS: Target date: 11/21/2022  Increase bilat LE strength to 5-/5 to 5/5  Baseline:  Goal status: INITIAL  2.  Increased Rt knee extension to (-) 2 to 0 degrees  Baseline:  Goal status: IN PROGRESS  3.  Improve times sit to stand to </= 12 sec  Baseline:  Goal status: MET (11 sec)  4.  Patient reports ability to walk for 20 min without sensation of weakness or "leg giving away" Baseline:  Goal status: MET  5.  Independent in HEP including aquatic program  Baseline:  Goal status: INITIAL   PLAN:  PT FREQUENCY: 2x/week  PT DURATION: 8 weeks  PLANNED INTERVENTIONS: Therapeutic exercises, Therapeutic activity, Neuromuscular re-education, Patient/Family education, Self Care, Joint mobilization, Aquatic Therapy, Dry Needling, Electrical stimulation, Spinal mobilization, Cryotherapy, Moist heat, Taping, Vasopneumatic device, Ultrasound, Ionotophoresis 4mg /ml Dexamethasone, Manual therapy, and Re-evaluation  PLAN FOR NEXT SESSION: Knee  strengthening. Progress exercises; continue with postural correction and education; manual work, DN, modalities as indicated.    Sanjuana Mae, PTA 10/26/2022, 4:06 PM

## 2022-10-31 ENCOUNTER — Ambulatory Visit: Payer: No Typology Code available for payment source | Admitting: Physical Therapy

## 2022-10-31 ENCOUNTER — Encounter: Payer: Self-pay | Admitting: Physical Therapy

## 2022-10-31 DIAGNOSIS — R29898 Other symptoms and signs involving the musculoskeletal system: Secondary | ICD-10-CM

## 2022-10-31 DIAGNOSIS — M6281 Muscle weakness (generalized): Secondary | ICD-10-CM

## 2022-10-31 DIAGNOSIS — M25572 Pain in left ankle and joints of left foot: Secondary | ICD-10-CM

## 2022-10-31 DIAGNOSIS — R269 Unspecified abnormalities of gait and mobility: Secondary | ICD-10-CM

## 2022-10-31 DIAGNOSIS — M25661 Stiffness of right knee, not elsewhere classified: Secondary | ICD-10-CM

## 2022-10-31 NOTE — Therapy (Signed)
OUTPATIENT PHYSICAL THERAPY LE TREATMENT   Patient Name: Kelli [name] MRN: [id number] DOB:12-Feb-1946, 77 y.o., female Today's Date: 10/31/2022  END OF SESSION:  PT End of Session - 10/31/22 1448     Visit Number 8    Number of Visits 16    Date for PT Re-Evaluation 11/21/22    Authorization Type [employer] 2024 copay $15    PT Start Time 1448    PT Stop Time 1530    PT Time Calculation (min) 42 min    Activity Tolerance Patient tolerated treatment well    Behavior During Therapy [facility] for tasks assessed/performed              Past Medical History:  Diagnosis Date   Anxiety    Arthritis    Borderline diabetic    Colon cancer (HCC)    colon ca dx 02/2009, lung cancer   Depression    Depression with anxiety 09/19/2011   Diabetes mellitus type 2, uncomplicated (HCC) 12/31/2014   Genetic testing 06/06/2022   H/O colon cancer, stage II 09/19/2011   T3N0  Cecum Sept 2010   High cholesterol    Hyperlipidemia 09/19/2011   Lung nodules 09/19/2011   granulomas   Osteoporosis 2022   Urinary urgency    Past Surgical History:  Procedure Laterality Date   APPENDECTOMY     AUGMENTATION MAMMAPLASTY     BREAST LUMPECTOMY WITH RADIOACTIVE SEED LOCALIZATION Right 03/22/2022   Procedure: RIGHT BREAST LUMPECTOMY WITH RADIOACTIVE SEED LOCALIZATION;  Surgeon: Almond Lint, MD;  Location: [facility];  Service: General;  Laterality: Right;   BREAST SURGERY  1978   augmentation   COLON SURGERY     2010   CRYO INTERCOSTAL NERVE BLOCK N/A 10/05/2014   Procedure: CRYO INTERCOSTAL NERVE BLOCK;  Surgeon: Loreli Slot, MD;  Location: [facility] OR;  Service: Thoracic;  Laterality: N/A;   LYMPH NODE DISSECTION Right 10/05/2014   Procedure: LYMPH NODE DISSECTION;  Surgeon: Loreli Slot, MD;  Location: [facility] OR;  Service: Thoracic;  Laterality: Right;   ORIF HUMERUS FRACTURE Left 10/01/2018   Procedure: OPEN REDUCTION INTERNAL FIXATION (ORIF) LEFT PROXIMAL HUMERUS  FRACTURE;  Surgeon: Cammy Copa, MD;  Location: [facility] OR;  Service: Orthopedics;  Laterality: Left;   PARTIAL COLECTOMY Right 2010   SEGMENTECOMY Right 10/05/2014   Procedure: right lower lobe SEGMENTECTOMY;  Surgeon: Loreli Slot, MD;  Location: [facility] OR;  Service: Thoracic;  Laterality: Right;   VIDEO ASSISTED THORACOSCOPY (VATS)/WEDGE RESECTION Right 10/05/2014   Procedure: Right VIDEO ASSISTED THORACOSCOPY (VATS) with right lower lobe lung nodule WEDGE RESECTION;  Surgeon: Loreli Slot, MD;  Location: [facility] OR;  Service: Thoracic;  Laterality: Right;   Patient Active Problem List   Diagnosis Date Noted   Sebaceous cyst 06/20/2022   Genetic testing 06/06/2022   Malignant neoplasm of upper-outer quadrant of right breast in female, estrogen receptor positive (HCC) 03/06/2022   Avulsion fracture of left ankle 10/27/2021   Skin lesion 07/05/2021   Insomnia 03/31/2021   Osteoporosis 11/12/2020   Closed fracture of left proximal humerus 09/26/2018   Squamous cell carcinoma 03/05/2017   Echocardiogram shows left ventricular diastolic dysfunction 08/21/2016   Heart palpitations 07/25/2016   Need for vaccination for pneumococcus 07/13/2016   Osteoarthritis of hand, right 08/06/2015   Diabetes mellitus type 2, uncomplicated (HCC) 12/31/2014   Lung cancer, lower lobe (HCC) 10/09/2014   Hematochezia 04/01/2013   H/O colon cancer, stage II 09/19/2011   Depression with  anxiety 09/19/2011   Hyperlipidemia 09/19/2011    PCP: Dr Everrett Coombe  REFERRING PROVIDER: Dr Clementeen Graham   REFERRING DIAG: bilateral leg weakness   THERAPY DIAG:  Muscle weakness (generalized)  Other symptoms and signs involving the musculoskeletal system  Stiffness of both knees  Acute left ankle pain  Abnormal gait  Rationale for Evaluation and Treatment: Rehabilitation  ONSET DATE: 07/20/22  SUBJECTIVE:                                                                                                                                                                                       SUBJECTIVE STATEMENT: "Just when I turn a certain way it just feels like they're going to collapse, but I've been real busy too." She notes she had a bad cramp in her R calf.  Hand dominance: Right  PERTINENT HISTORY: Fx Lt shoulder with ORIF; chronic Rt shoulder pain; HTN; arthritis; osteoporosis; history of cancer colon, lung, breast; fx Lt foot; hx of LBP    PAIN:  Are you having pain? Yes: NPRS scale: 0/10 Pain location:  Pain description:  Aggravating factors:  Relieving factors:   PRECAUTIONS:   WEIGHT BEARING RESTRICTIONS: No  FALLS:  Has patient fallen in last 6 months? No  LIVING ENVIRONMENT: Lives with: lives alone Lives in: House/apartment Stairs: level entry; one level  Has following equipment at home: None  OCCUPATION: Retired; Primary school teacher Activities: household chores; water exercise in summer; travel; dog sitting   PLOF: Independent  PATIENT GOALS: get stronger  NEXT MD VISIT: 01/15/23  OBJECTIVE:   DIAGNOSTIC FINDINGS:  None for knees    POSTURE: Patient presents with head forward posture with increased thoracic kyphosis; shoulders rounded and elevated; scapulae abducted and rotated along the thoracic spine; head of the humerus anterior in orientation; slightly flexed forward at hips   LOWER EXTREMITY ROM:     Active  Right eval Left eval Right 10/23/22 Left 10/23/22  Hip flexion      Hip extension      Hip abduction      Hip adduction      Hip internal rotation      Hip external rotation      Knee flexion 137 135    Knee extension -10 -4 -3 0  Ankle dorsiflexion 5 0    Ankle plantarflexion      Ankle inversion      Ankle eversion       (Blank rows = not tested)   LOWER EXTREMITY MMT:    MMT Right eval Left eval  Hip flexion 4+ 4+  Hip extension 4- 4-  Hip abduction 4+ 4+  Hip adduction    Hip internal rotation    Hip external rotation     Knee flexion 5 5  Knee extension 5 5  Ankle dorsiflexion 4+ 4  Ankle plantarflexion 4 4  Ankle inversion    Ankle eversion     (Blank rows = not tested)   5 times sit to stand: 13.65 sec   [facility] Adult PT Treatment:                                                DATE: 10/31/22 Therapeutic Exercise: Recumbent bike L2 x 5 min Gastroc stretch x30 sec  Soleus stretch x30 sec SAQ 4# W 10x5" SLR small range ER 4# AW 10x5" S/L hip abd 4# AW 2x10 Leg press: DL 16#  Resisted walking around ankle 5# L & R   [facility] Adult PT Treatment:                                                DATE: 10/26/2022 Therapeutic Exercise: Gastroc stretch on slant board 3x30" 4#AW SAQ green bolster 10x5" (B) SLR small range: parallel/ER/IR 10x5" each  5#AW LAQ 10x5" Standing squats --> cradling 10#KB Leg press: DL 10# --> ball b/w knees    [facility] Adult PT Treatment:                                                DATE: 10/23/2022 Therapeutic Exercise: Standing resisted hip abd RTB crossed at ankles Standing 3-way hip (flex, abd, ext toe taps) RTB Runner's lunge stretch Knee extension measurements    [facility] Adult PT Treatment:                                                DATE: 10/19/2022 Therapeutic Exercise: Treadmill warm-up 1.7 mph, 0% incline x 5 min Slant board gastroc/soleus stretch 4" step heel taps Standing TKE GTB Leg press: DL 96# E45, 40# J81 --> SL 45# x15 Resisted fwd/bkwd walking 10# pulley --> side stepping 5# pulley     PATIENT EDUCATION: Education details: Updated HEP  Person educated: Patient Education method: Explanation, Demonstration, Tactile cues, Verbal cues, and Handouts Education comprehension: verbalized understanding, returned demonstration, verbal cues required, tactile cues required, and needs further education  HOME EXERCISE PROGRAM: Access Code: [email or web address] URL: [email or web address] Date: 09/28/2022 Prepared by: Carlynn Herald  Exercises - Seated  Hamstring Stretch  - 2 x daily - 7 x weekly - 1 sets - 3 reps - 30 sec  hold - Sit to Stand  - 2 x daily - 7 x weekly - 1-2 sets - 10 reps - 3-5 sec  hold - Gastroc Stretch on Wall  - 2 x daily - 7 x weekly - 1 sets - 3 reps - 30 sec  hold - Soleus Stretch on Wall  - 2 x daily - 7 x weekly - 1 sets - 3 reps - 30 sec  hold - Clam with Resistance  - 1  x daily - 7 x weekly - 3 sets - 10 reps - Seated Knee Extension with Resistance  - 1 x daily - 7 x weekly - 3 sets - 10 reps  ASSESSMENT:  CLINICAL IMPRESSION:  Continued to work on LE stability and strengthening. Pt tolerated well.    OBJECTIVE IMPAIRMENTS: decreased activity tolerance, decreased mobility, decreased ROM, decreased strength, increased fascial restrictions, increased muscle spasms, impaired flexibility, impaired UE functional use, improper body mechanics, postural dysfunction, and pain.    GOALS: Goals reviewed with patient? Yes  SHORT TERM GOALS: Target date: 10/24/2022  Independent in initial HEP  Goal status: MET  2.  Increase Rt knee extension to (-) 5 and Lt knee extension to 0 degrees  Baseline:  Goal status: MET   LONG TERM GOALS: Target date: 11/21/2022  Increase bilat LE strength to 5-/5 to 5/5  Baseline:  Goal status: INITIAL  2.  Increased Rt knee extension to (-) 2 to 0 degrees  Baseline:  Goal status: IN PROGRESS  3.  Improve times sit to stand to </= 12 sec  Baseline:  Goal status: MET (11 sec)  4.  Patient reports ability to walk for 20 min without sensation of weakness or "leg giving away" Baseline:  Goal status: MET  5.  Independent in HEP including aquatic program  Baseline:  Goal status: INITIAL   PLAN:  PT FREQUENCY: 2x/week  PT DURATION: 8 weeks  PLANNED INTERVENTIONS: Therapeutic exercises, Therapeutic activity, Neuromuscular re-education, Patient/Family education, Self Care, Joint mobilization, Aquatic Therapy, Dry Needling, Electrical stimulation, Spinal mobilization,  Cryotherapy, Moist heat, Taping, Vasopneumatic device, Ultrasound, Ionotophoresis 4mg /ml Dexamethasone, Manual therapy, and Re-evaluation  PLAN FOR NEXT SESSION: Knee strengthening. Progress exercises; continue with postural correction and education; manual work, DN, modalities as indicated.    Breona Cherubin April Ma L Camey Edell, PT 10/31/2022, 2:48 PM

## 2022-11-02 ENCOUNTER — Ambulatory Visit (INDEPENDENT_AMBULATORY_CARE_PROVIDER_SITE_OTHER): Payer: No Typology Code available for payment source | Admitting: Podiatry

## 2022-11-02 ENCOUNTER — Encounter: Payer: Self-pay | Admitting: Podiatry

## 2022-11-02 DIAGNOSIS — L84 Corns and callosities: Secondary | ICD-10-CM

## 2022-11-02 DIAGNOSIS — M79675 Pain in left toe(s): Secondary | ICD-10-CM | POA: Diagnosis not present

## 2022-11-02 DIAGNOSIS — B351 Tinea unguium: Secondary | ICD-10-CM

## 2022-11-02 DIAGNOSIS — M79674 Pain in right toe(s): Secondary | ICD-10-CM

## 2022-11-02 DIAGNOSIS — E119 Type 2 diabetes mellitus without complications: Secondary | ICD-10-CM

## 2022-11-02 NOTE — Progress Notes (Signed)
  Subjective:  Patient ID: Kelli [name], female    DOB: 01-Jul-1945,   MRN: [id number]  Chief Complaint  Patient presents with   Nail Problem     Routine foot care   Callouses     Callus trim    77 y.o. female presents for concern of a painful corn on the right foot that has been present for many years.  Requestin nail trim as well. Requesting to have it trimmed. Although it is doing much better. Patient relates she is diabetic and her last A1c was  7.5 . Denies any other pedal complaints. Denies n/v/f/c.   PCP: Everrett Coombe DO Last seen 10/03/21  Past Medical History:  Diagnosis Date   Anxiety    Arthritis    Borderline diabetic    Colon cancer (HCC)    colon ca dx 02/2009, lung cancer   Depression    Depression with anxiety 09/19/2011   Diabetes mellitus type 2, uncomplicated (HCC) 12/31/2014   Genetic testing 06/06/2022   H/O colon cancer, stage II 09/19/2011   T3N0  Cecum Sept 2010   High cholesterol    Hyperlipidemia 09/19/2011   Lung nodules 09/19/2011   granulomas   Osteoporosis 2022   Urinary urgency     Objective:  Physical Exam: Vascular: DP/PT pulses 2/4 bilateral. CFT <3 seconds. Normal hair growth on digits. No edema.  Skin. No lacerations or abrasions bilateral feet. Hyperkeratotic lesion noted to medial fifth digit on the right.  Musculoskeletal: MMT 5/5 bilateral lower extremities in DF, PF, Inversion and Eversion. Deceased ROM in DF of ankle joint. Adductovarus noted to right fifth digit.  Neurological: Sensation intact to light touch.   Assessment:   1. Corns and callosities   2. Type 2 diabetes mellitus without complication, without long-term current use of insulin (HCC)   3. Pain due to onychomycosis of toenails of both feet         Plan:  Patient was evaluated and treated and all questions answered. -Discussed corns and calluses with patient and treatment options.  -Hyperkeratotic tissue was debrided with chisel without incident.   -Mechanically debrided nails 1-5 bilateral with nail nipper without incident.  -Encouraged daily moisturizing -Discussed use of pumice stone -Advised good supportive shoes and inserts -Discussed and educated patient on diabetic foot care, especially with  regards to the vascular, neurological and musculoskeletal systems.  -Stressed the importance of good glycemic control and the detriment of not  controlling glucose levels in relation to the foot. -Discussed supportive shoes at all times and checking feet regularly.  -Answered all patient questions -Patient to return in 6 month for diabetic foot check.     Louann Sjogren, DPM

## 2022-11-06 ENCOUNTER — Ambulatory Visit (INDEPENDENT_AMBULATORY_CARE_PROVIDER_SITE_OTHER): Payer: No Typology Code available for payment source | Admitting: Family Medicine

## 2022-11-06 DIAGNOSIS — Z780 Asymptomatic menopausal state: Secondary | ICD-10-CM | POA: Diagnosis not present

## 2022-11-06 DIAGNOSIS — Z0000 Encounter for general adult medical examination without abnormal findings: Secondary | ICD-10-CM | POA: Diagnosis not present

## 2022-11-06 NOTE — Patient Instructions (Addendum)
MEDICARE ANNUAL WELLNESS VISIT Health Maintenance Summary and Written Plan of Care  Kelli [name] ,  Thank you for allowing me to perform your Medicare Annual Wellness Visit and for your ongoing commitment to your health.   Health Maintenance & Immunization History Health Maintenance  Topic Date Due   COVID-19 Vaccine (6 - 2023-24 season) 07/07/2023 (Originally 05/25/2022)   DEXA SCAN  11/11/2022   OPHTHALMOLOGY EXAM  11/18/2022   INFLUENZA VACCINE  01/04/2023   Diabetic kidney evaluation - Urine ACR  02/17/2023   HEMOGLOBIN A1C  03/16/2023   FOOT EXAM  09/14/2023   Diabetic kidney evaluation - eGFR measurement  10/02/2023   Medicare Annual Wellness (AWV)  11/06/2023   MAMMOGRAM  02/18/2024   Colonoscopy  01/29/2025   DTaP/Tdap/Td (2 - Td or Tdap) 10/01/2028   Pneumonia Vaccine 28+ Years old  Completed   Hepatitis C Screening  Completed   Zoster Vaccines- Shingrix  Completed   HPV VACCINES  Aged Out   Immunization History  Administered Date(s) Administered   COVID-19, mRNA, vaccine(Comirnaty)12 years and older 03/30/2022   Fluad Quad(high Dose 65+) 03/13/2019, 04/01/2020, 03/31/2021, 02/16/2022   Influenza,inj,Quad PF,6+ Mos 03/20/2018   PFIZER(Purple Top)SARS-COV-2 Vaccination 08/08/2019, 09/03/2019, 04/12/2020, 01/20/2021, 03/30/2022   Pfizer Covid-19 Vaccine Bivalent Booster 36yrs & up 03/30/2022   Pneumococcal Conjugate-13 08/22/2016   Pneumococcal Polysaccharide-23 07/05/2021   Pneumococcal-Unspecified 02/03/2009   Tdap 10/02/2018   Zoster Recombinat (Shingrix) 03/30/2022, 07/26/2022    These are the patient goals that we discussed:  Goals Addressed               This Visit's Progress     Patient Stated (pt-stated)        Patient stated that she would like to loose some weight.         This is a list of Health Maintenance Items that are overdue or due now: Bone densitometry screening    Orders/Referrals Placed Today: Orders Placed This Encounter   Procedures   DEXAScan    Standing Status:   Future    Standing Expiration Date:   11/06/2023    Scheduling Instructions:     Please call patient to schedule.    Order Specific Question:   Reason for exam:    Answer:   post menopausal    Order Specific Question:   Preferred imaging location?    Answer:   [facility]    (Contact our referral department at [phone] if you have not spoken with someone about your referral appointment within the next 5 days)    Follow-up Plan Follow-up with Everrett Coombe, DO as planned Medicare wellness visit in one year.  Patient will access AVS on my chart.     Bone Density Test A bone density test uses a type of X-ray to measure the amount of calcium and other minerals in a person's bones. It can measure bone density in the hip and the spine. The test is similar to having a regular X-ray. This test may also be called: Bone densitometry. Bone mineral density test. Dual-energy X-ray absorptiometry (DEXA). You may have this test to: Diagnose a condition that causes weak or thin bones (osteoporosis). Screen you for osteoporosis. Predict your risk for a broken bone (fracture). Determine how well your osteoporosis treatment is working. Tell a health care provider about: Any allergies you have. All medicines you are taking, including vitamins, herbs, eye drops, creams, and over-the-counter medicines. Any problems you or family members have had with anesthetic medicines. Any  blood disorders you have. Any surgeries you have had. Any medical conditions you have. Whether you are pregnant or may be pregnant. Any medical tests you have had within the past 14 days that used contrast material. What are the risks? Generally, this is a safe test. However, it does expose you to a small amount of radiation, which can slightly increase your cancer risk. What happens before the test? Do not take any calcium supplements within the 24 hours before  your test. You will need to remove all metal jewelry, eyeglasses, removable dental appliances, and any other metal objects on your body. What happens during the test?  You will lie down on an exam table. There will be an X-ray generator below you and an imaging device above you. Other devices, such as boxes or braces, may be used to position your body properly for the scan. The machine will slowly scan your body. You will need to keep very still while the machine does the scan. The images will show up on a screen in the room. Images will be examined by a specialist after your test is finished. The procedure may vary among health care providers and hospitals. What can I expect after the test? It is up to you to get the results of your test. Ask your health care provider, or the department that is doing the test, when your results will be ready. Summary A bone density test is an imaging test that uses a type of X-ray to measure the amount of calcium and other minerals in your bones. The test may be used to diagnose or screen you for a condition that causes weak or thin bones (osteoporosis), predict your risk for a broken bone (fracture), or determine how well your osteoporosis treatment is working. Do not take any calcium supplements within 24 hours before your test. Ask your health care provider, or the department that is doing the test, when your results will be ready. This information is not intended to replace advice given to you by your health care provider. Make sure you discuss any questions you have with your health care provider. Document Revised: 02/02/2021 Document Reviewed: 11/06/2019 Elsevier Patient Education  2024 [employer]  Health Maintenance, Female Adopting a healthy lifestyle and getting preventive care are important in promoting health and wellness. Ask your health care provider about: The right schedule for you to have regular tests and exams. Things you can do on your  own to prevent diseases and keep yourself healthy. What should I know about diet, weight, and exercise? Eat a healthy diet  Eat a diet that includes plenty of vegetables, fruits, low-fat dairy products, and lean protein. Do not eat a lot of foods that are high in solid fats, added sugars, or sodium. Maintain a healthy weight Body mass index (BMI) is used to identify weight problems. It estimates body fat based on height and weight. Your health care provider can help determine your BMI and help you achieve or maintain a healthy weight. Get regular exercise Get regular exercise. This is one of the most important things you can do for your health. Most adults should: Exercise for at least 150 minutes each week. The exercise should increase your heart rate and make you sweat (moderate-intensity exercise). Do strengthening exercises at least twice a week. This is in addition to the moderate-intensity exercise. Spend less time sitting. Even light physical activity can be beneficial. Watch cholesterol and blood lipids Have your blood tested for lipids and cholesterol  at 75 years of age, then have this test every 5 years. Have your cholesterol levels checked more often if: Your lipid or cholesterol levels are high. You are older than 75 years of age. You are at high risk for heart disease. What should I know about cancer screening? Depending on your health history and family history, you may need to have cancer screening at various ages. This may include screening for: Breast cancer. Cervical cancer. Colorectal cancer. Skin cancer. Lung cancer. What should I know about heart disease, diabetes, and high blood pressure? Blood pressure and heart disease High blood pressure causes heart disease and increases the risk of stroke. This is more likely to develop in people who have high blood pressure readings or are overweight. Have your blood pressure checked: Every 3-5 years if you are 18-39 years of  age. Every year if you are 2 years old or older. Diabetes Have regular diabetes screenings. This checks your fasting blood sugar level. Have the screening done: Once every three years after age 6 if you are at a normal weight and have a low risk for diabetes. More often and at a younger age if you are overweight or have a high risk for diabetes. What should I know about preventing infection? Hepatitis B If you have a higher risk for hepatitis B, you should be screened for this virus. Talk with your health care provider to find out if you are at risk for hepatitis B infection. Hepatitis C Testing is recommended for: Everyone born from [birth year] through 1963. Anyone with known risk factors for hepatitis C. Sexually transmitted infections (STIs) Get screened for STIs, including gonorrhea and chlamydia, if: You are sexually active and are younger than 75 years of age. You are older than 75 years of age and your health care provider tells you that you are at risk for this type of infection. Your sexual activity has changed since you were last screened, and you are at increased risk for chlamydia or gonorrhea. Ask your health care provider if you are at risk. Ask your health care provider about whether you are at high risk for HIV. Your health care provider may recommend a prescription medicine to help prevent HIV infection. If you choose to take medicine to prevent HIV, you should first get tested for HIV. You should then be tested every 3 months for as long as you are taking the medicine. Pregnancy If you are about to stop having your period (premenopausal) and you may become pregnant, seek counseling before you get pregnant. Take 400 to 800 micrograms (mcg) of folic acid every day if you become pregnant. Ask for birth control (contraception) if you want to prevent pregnancy. Osteoporosis and menopause Osteoporosis is a disease in which the bones lose minerals and strength with aging. This can  result in bone fractures. If you are 75 years old or older, or if you are at risk for osteoporosis and fractures, ask your health care provider if you should: Be screened for bone loss. Take a calcium or vitamin D supplement to lower your risk of fractures. Be given hormone replacement therapy (HRT) to treat symptoms of menopause. Follow these instructions at home: Alcohol use Do not drink alcohol if: Your health care provider tells you not to drink. You are pregnant, may be pregnant, or are planning to become pregnant. If you drink alcohol: Limit how much you have to: 0-1 drink a day. Know how much alcohol is in your drink. In the [place] one  drink equals one 12 oz bottle of beer (355 mL), one 5 oz glass of wine (148 mL), or one 1 oz glass of hard liquor (44 mL). Lifestyle Do not use any products that contain nicotine or tobacco. These products include cigarettes, chewing tobacco, and vaping devices, such as e-cigarettes. If you need help quitting, ask your health care provider. Do not use street drugs. Do not share needles. Ask your health care provider for help if you need support or information about quitting drugs. General instructions Schedule regular health, dental, and eye exams. Stay current with your vaccines. Tell your health care provider if: You often feel depressed. You have ever been abused or do not feel safe at home. Summary Adopting a healthy lifestyle and getting preventive care are important in promoting health and wellness. Follow your health care provider's instructions about healthy diet, exercising, and getting tested or screened for diseases. Follow your health care provider's instructions on monitoring your cholesterol and blood pressure. This information is not intended to replace advice given to you by your health care provider. Make sure you discuss any questions you have with your health care provider. Document Revised: 10/11/2020 Document Reviewed:  10/11/2020 Elsevier Patient Education  2024 [employer].

## 2022-11-06 NOTE — Progress Notes (Signed)
MEDICARE ANNUAL WELLNESS VISIT  11/06/2022  Telephone Visit Disclaimer This Medicare AWV was conducted by telephone due to national recommendations for restrictions regarding the COVID-19 Pandemic (e.g. social distancing).  I verified, using two identifiers, that I am speaking with Kelli [name] or their authorized healthcare agent. I discussed the limitations, risks, security, and privacy concerns of performing an evaluation and management service by telephone and the potential availability of an in-person appointment in the future. The patient expressed understanding and agreed to proceed.  Location of Patient: Home Location of Provider (nurse):  In the office.  Subjective:    Kelli [name] is a 77 y.o. female patient of Kelli Coombe, DO who had a Medicare Annual Wellness Visit today via telephone. [name] is Retired and lives alone. she does not have any children. she reports that she is socially active and does interact with friends/family regularly. she is moderately physically active and enjoys reading, stream movies & series and swimming.  Patient Care Team: Kelli Coombe, DO as PCP - General (Family Medicine) Levert Feinstein, MD as Consulting Physician (Oncology) Rachael Fee, MD as Attending Physician (Gastroenterology) Ladene Artist, MD as Consulting Physician (Oncology)     11/06/2022    2:01 PM 10/09/2022    2:58 PM 09/26/2022   11:54 AM 04/12/2022   10:32 AM 03/22/2022   10:05 AM 11/03/2021   11:56 AM 11/01/2021    1:34 PM  Advanced Directives  Does Patient Have a Medical Advance Directive? Yes Yes Yes Yes Yes Yes Yes  Type of Advance Directive Living will Healthcare Power of [place];Living will Healthcare Power of [place];Living will Healthcare Power of [place];Living will Healthcare Power of [place];Living will Living will Living will  Does patient want to make changes to medical advance directive? No - Patient declined No - Patient declined     No -  Patient declined  Copy of Healthcare Power of Attorney in Chart?  Yes - validated most recent copy scanned in chart (See row information) No - copy requested Yes - validated most recent copy scanned in chart (See row information) Yes - validated most recent copy scanned in chart (See row information)      Hospital Utilization Over the Past 12 Months: # of hospitalizations or ER visits: 0 # of surgeries: 1  Review of Systems    Patient reports that her overall health is unchanged compared to last year.  History obtained from chart review and the patient  Patient Reported Readings (BP, Pulse, CBG, Weight, etc) none  Pain Assessment Pain : No/denies pain     Current Medications & Allergies (verified) Allergies as of 11/06/2022   No Known Allergies      Medication List        Accurate as of November 06, 2022  2:15 PM. If you have any questions, ask your nurse or doctor.          AMBULATORY NON FORMULARY MEDICATION Single glucometer with lancets, test strips. Test daily.  Disp qs 3 months E11.9   anastrozole 1 MG tablet Commonly known as: ARIMIDEX Take 1 tablet (1 mg total) by mouth daily.   B-complex with vitamin C tablet Take 1 tablet by mouth daily.   B-D UF III MINI PEN NEEDLES 31G X 5 MM Misc Generic drug: Insulin Pen Needle USE AS DIRECTED   Basaglar KwikPen 100 UNIT/ML 35 UNITS AT BED INCREASE BY 2 UNITS EVERY 3 DAYS TIL FASTING SUGAR GOAL 120-150 MAX 60 UNITS PER DAY  Calcium 1000 + D 1000-20 MG-MCG Tabs Generic drug: Calcium Carb-Cholecalciferol Take 1 tablet by mouth daily.   cloNIDine 0.1 MG tablet Commonly known as: CATAPRES TAKE 1 TABLET BY MOUTH EVERY DAY   FLUoxetine 40 MG capsule Commonly known as: PROZAC TAKE 1 CAPSULE BY MOUTH EVERY DAY   gabapentin 300 MG capsule Commonly known as: NEURONTIN TAKE 1 CAPSULE (300 MG TOTAL) BY MOUTH AT BEDTIME AS NEEDED (INSOMNIA).   OneTouch Delica Plus Lancet33G Misc TEST BLOOD SUGARS DAILY   OneTouch  Verio test strip Generic drug: glucose blood TEST BLOOD SUGARS DAILY   pravastatin 80 MG tablet Commonly known as: PRAVACHOL TAKE 1 TABLET BY MOUTH EVERY DAY   Vitamin D 50 MCG (2000 UT) tablet Take 2,000 Units by mouth daily.        History (reviewed): Past Medical History:  Diagnosis Date   Anxiety    Arthritis    Borderline diabetic    Colon cancer (HCC)    colon ca dx 02/2009, lung cancer   Depression    Depression with anxiety 09/19/2011   Diabetes mellitus type 2, uncomplicated (HCC) 12/31/2014   Genetic testing 06/06/2022   H/O colon cancer, stage II 09/19/2011   T3N0  [place] Sept 2010   High cholesterol    Hyperlipidemia 09/19/2011   Lung nodules 09/19/2011   granulomas   Osteoporosis 2022   Urinary urgency    Past Surgical History:  Procedure Laterality Date   APPENDECTOMY     AUGMENTATION MAMMAPLASTY     BREAST LUMPECTOMY WITH RADIOACTIVE SEED LOCALIZATION Right 03/22/2022   Procedure: RIGHT BREAST LUMPECTOMY WITH RADIOACTIVE SEED LOCALIZATION;  Surgeon: Almond Lint, MD;  Location: [facility];  Service: General;  Laterality: Right;   BREAST SURGERY  1978   augmentation   COLON SURGERY     2010   CRYO INTERCOSTAL NERVE BLOCK N/A 10/05/2014   Procedure: CRYO INTERCOSTAL NERVE BLOCK;  Surgeon: Loreli Slot, MD;  Location: [facility] OR;  Service: Thoracic;  Laterality: N/A;   EYE SURGERY     cataracts   LYMPH NODE DISSECTION Right 10/05/2014   Procedure: LYMPH NODE DISSECTION;  Surgeon: Loreli Slot, MD;  Location: [facility] OR;  Service: Thoracic;  Laterality: Right;   ORIF HUMERUS FRACTURE Left 10/01/2018   Procedure: OPEN REDUCTION INTERNAL FIXATION (ORIF) LEFT PROXIMAL HUMERUS FRACTURE;  Surgeon: Cammy Copa, MD;  Location: [facility] OR;  Service: Orthopedics;  Laterality: Left;   PARTIAL COLECTOMY Right 2010   SEGMENTECOMY Right 10/05/2014   Procedure: right lower lobe SEGMENTECTOMY;  Surgeon: Loreli Slot, MD;  Location: [facility]  OR;  Service: Thoracic;  Laterality: Right;   VIDEO ASSISTED THORACOSCOPY (VATS)/WEDGE RESECTION Right 10/05/2014   Procedure: Right VIDEO ASSISTED THORACOSCOPY (VATS) with right lower lobe lung nodule WEDGE RESECTION;  Surgeon: Loreli Slot, MD;  Location: [facility] OR;  Service: Thoracic;  Laterality: Right;   Family History  Problem Relation Age of Onset   Heart disease Mother    Colon polyps Sister    Lung cancer Sister    Heart disease Brother    Cancer Brother        unk type   Breast cancer Niece    Breast cancer Niece    Other Niece        gene +, possibly BRCA   Colon cancer Neg Hx    Esophageal cancer Neg Hx    Stomach cancer Neg Hx    Rectal cancer Neg Hx    Social History  Socioeconomic History   Marital status: Divorced    Spouse name: Not on file   Number of children: 0   Years of education: 46   Highest education level: Bachelor's degree (e.g., BA, AB, BS)  Occupational History    Comment: Retired.  Tobacco Use   Smoking status: Former    Packs/day: 3.00    Years: 40.00    Additional pack years: 0.00    Total pack years: 120.00    Types: Cigarettes    Start date: 67    Quit date: 02/06/2009    Years since quitting: 13.7   Smokeless tobacco: Never  Vaping Use   Vaping Use: Never used  Substance and Sexual Activity   Alcohol use: Yes    Alcohol/week: 2.0 standard drinks of alcohol    Types: 2 Cans of beer per week    Comment: 1-2 beers every other day   Drug use: Yes    Types: Marijuana    Comment: occasionally   Sexual activity: Not Currently    Birth control/protection: Post-menopausal  Other Topics Concern   Not on file  Social History Narrative   Lives alone with her puppy. She does not have any children. She enjoys reading, stream movies & series and swimming.   Social Determinants of Health   Financial Resource Strain: Low Risk  (11/02/2022)   Overall Financial Resource Strain (CARDIA)    Difficulty of Paying Living Expenses: Not hard  at all  Food Insecurity: No Food Insecurity (11/02/2022)   Hunger Vital Sign    Worried About Running Out of Food in the Last Year: Never true    Ran Out of Food in the Last Year: Never true  Transportation Needs: No Transportation Needs (11/02/2022)   PRAPARE - Administrator, Civil Service (Medical): No    Lack of Transportation (Non-Medical): No  Physical Activity: Insufficiently Active (11/02/2022)   Exercise Vital Sign    Days of Exercise per Week: 1 day    Minutes of Exercise per Session: 20 min  Stress: No Stress Concern Present (11/02/2022)   [employer] of Occupational Health - Occupational Stress Questionnaire    Feeling of Stress : Not at all  Social Connections: Moderately Isolated (11/06/2022)   Social Connection and Isolation Panel [NHANES]    Frequency of Communication with Friends and Family: More than three times a week    Frequency of Social Gatherings with Friends and Family: Once a week    Attends Religious Services: 1 to 4 times per year    Active Member of [employer] or Organizations: No    Attends Banker Meetings: Never    Marital Status: Divorced    Activities of Daily Living    11/02/2022   11:32 AM 03/22/2022   10:11 AM  In your present state of health, do you have any difficulty performing the following activities:  Hearing? 0 0  Vision? 0 0  Difficulty concentrating or making decisions? 0 0  Walking or climbing stairs? 0 0  Dressing or bathing? 0 0  Doing errands, shopping? 0   Preparing Food and eating ? N   Using the Toilet? N   In the past six months, have you accidently leaked urine? Y   Do you have problems with loss of bowel control? N   Managing your Medications? N   Managing your Finances? N   Housekeeping or managing your Housekeeping? N     Patient Education/ Literacy How often do you need  to have someone help you when you read instructions, pamphlets, or other written materials from your doctor or pharmacy?: 1 -  Never What is the last grade level you completed in school?: bachelor's degree  Exercise Current Exercise Habits: Home exercise routine, Type of exercise: Other - see comments;stretching;strength training/weights (physical therapy), Time (Minutes): 60, Frequency (Times/Week): 2, Weekly Exercise (Minutes/Week): 120, Intensity: Moderate, Exercise limited by: orthopedic condition(s) (knee weakness)  Diet Patient reports consuming 3 meals a day and 0 snack(s) a day Patient reports that her primary diet is: Regular Patient reports that she does have regular access to food.   Depression Screen    11/06/2022    2:01 PM 09/14/2022    3:24 PM 05/18/2022    2:28 PM 05/18/2022    2:27 PM 02/16/2022    3:25 PM 02/16/2022    2:25 PM 11/01/2021    1:45 PM  PHQ 2/9 Scores  PHQ - 2 Score 0 0 0 0 0 0 0  PHQ- 9 Score  0 0  0       Fall Risk    11/06/2022    2:01 PM 11/02/2022   11:32 AM 09/14/2022    2:52 PM 05/18/2022    2:27 PM 02/16/2022    2:25 PM  Fall Risk   Falls in the past year? 0 0 0 0 0  Number falls in past yr: 0 0 0 0 0  Injury with Fall? 0 0 0 0 0  Risk for fall due to : No Fall Risks  No Fall Risks No Fall Risks No Fall Risks  Follow up Falls evaluation completed  Falls evaluation completed Falls evaluation completed Falls evaluation completed     Objective:  Kelli [name] seemed alert and oriented and she participated appropriately during our telephone visit.  Blood Pressure Weight BMI  BP Readings from Last 3 Encounters:  10/09/22 138/69  09/14/22 126/69  09/06/22 (!) 158/82   Wt Readings from Last 3 Encounters:  10/09/22 180 lb (81.6 kg)  09/14/22 177 lb (80.3 kg)  09/06/22 179 lb (81.2 kg)   BMI Readings from Last 1 Encounters:  10/09/22 27.37 kg/m    *Unable to obtain current vital signs, weight, and BMI due to telephone visit type  Hearing/Vision  Kelli [name] did not seem to have difficulty with hearing/understanding during the telephone conversation Reports that she  has had a formal eye exam by an eye care professional within the past year Reports that she has not had a formal hearing evaluation within the past year *Unable to fully assess hearing and vision during telephone visit type  Cognitive Function:    11/06/2022    2:08 PM 11/01/2021    1:46 PM 10/25/2020    2:15 PM  6CIT Screen  What Year? 0 points 0 points 0 points  What month? 0 points 0 points 0 points  What time? 0 points 0 points 0 points  Count back from 20 0 points 0 points 0 points  Months in reverse 0 points 0 points 0 points  Repeat phrase 0 points 0 points 0 points  Total Score 0 points 0 points 0 points   (Normal:0-7, Significant for Dysfunction: >8)  Normal Cognitive Function Screening: Yes   Immunization & Health Maintenance Record Immunization History  Administered Date(s) Administered   COVID-19, mRNA, vaccine(Comirnaty)12 years and older 03/30/2022   Fluad Quad(high Dose 65+) 03/13/2019, 04/01/2020, 03/31/2021, 02/16/2022   Influenza,inj,Quad PF,6+ Mos 03/20/2018   PFIZER(Purple Top)SARS-COV-2 Vaccination 08/08/2019, 09/03/2019, 04/12/2020, 01/20/2021,  03/30/2022   Pfizer Covid-19 Vaccine Bivalent Booster 99yrs & up 03/30/2022   Pneumococcal Conjugate-13 08/22/2016   Pneumococcal Polysaccharide-23 07/05/2021   Pneumococcal-Unspecified 02/03/2009   Tdap 10/02/2018   Zoster Recombinat (Shingrix) 03/30/2022, 07/26/2022    Health Maintenance  Topic Date Due   COVID-19 Vaccine (6 - 2023-24 season) 07/07/2023 (Originally 05/25/2022)   DEXA SCAN  11/11/2022   OPHTHALMOLOGY EXAM  11/18/2022   INFLUENZA VACCINE  01/04/2023   Diabetic kidney evaluation - Urine ACR  02/17/2023   HEMOGLOBIN A1C  03/16/2023   FOOT EXAM  09/14/2023   Diabetic kidney evaluation - eGFR measurement  10/02/2023   Medicare Annual Wellness (AWV)  11/06/2023   MAMMOGRAM  02/18/2024   Colonoscopy  01/29/2025   DTaP/Tdap/Td (2 - Td or Tdap) 10/01/2028   Pneumonia Vaccine 49+ Years old   Completed   Hepatitis C Screening  Completed   Zoster Vaccines- Shingrix  Completed   HPV VACCINES  Aged Out       Assessment  This is a routine wellness examination for [employer]  Health Maintenance: Due or Overdue There are no preventive care reminders to display for this patient.   Kelli [name] does not need a referral for Community Assistance: Care Management:   no Social Work:    no Prescription Assistance:  no Nutrition/Diabetes Education:  no   Plan:  Personalized Goals  Goals Addressed               This Visit's Progress     Patient Stated (pt-stated)        Patient stated that she would like to loose some weight.       Personalized Health Maintenance & Screening Recommendations  Bone densitometry screening  Lung Cancer Screening Recommended: yes; due in August (Low Dose CT Chest recommended if Age 40-80 years, 20 pack-year currently smoking OR have quit w/in past 15 years) Hepatitis C Screening recommended: no HIV Screening recommended: no  Advanced Directives: Written information was not prepared per patient's request.  Referrals & Orders Orders Placed This Encounter  Procedures   DEXAScan    Follow-up Plan Follow-up with Kelli Coombe, DO as planned Medicare wellness visit in one year.  Patient will access AVS on my chart.   I have personally reviewed and noted the following in the patient's chart:   Medical and social history Use of alcohol, tobacco or illicit drugs  Current medications and supplements Functional ability and status Nutritional status Physical activity Advanced directives List of other physicians Hospitalizations, surgeries, and ER visits in previous 12 months Vitals Screenings to include cognitive, depression, and falls Referrals and appointments  In addition, I have reviewed and discussed with Kelli [name] certain preventive protocols, quality metrics, and best practice recommendations. A written  personalized care plan for preventive services as well as general preventive health recommendations is available and can be mailed to the patient at her request.      Modesto Charon, RN BSN  11/06/2022

## 2022-11-07 ENCOUNTER — Ambulatory Visit: Payer: No Typology Code available for payment source | Admitting: Rehabilitative and Restorative Service Providers"

## 2022-11-09 ENCOUNTER — Ambulatory Visit: Payer: No Typology Code available for payment source | Attending: Family Medicine

## 2022-11-09 DIAGNOSIS — M25661 Stiffness of right knee, not elsewhere classified: Secondary | ICD-10-CM | POA: Diagnosis not present

## 2022-11-09 DIAGNOSIS — M6281 Muscle weakness (generalized): Secondary | ICD-10-CM | POA: Diagnosis not present

## 2022-11-09 DIAGNOSIS — M25572 Pain in left ankle and joints of left foot: Secondary | ICD-10-CM | POA: Insufficient documentation

## 2022-11-09 DIAGNOSIS — R29898 Other symptoms and signs involving the musculoskeletal system: Secondary | ICD-10-CM | POA: Diagnosis not present

## 2022-11-09 DIAGNOSIS — M25662 Stiffness of left knee, not elsewhere classified: Secondary | ICD-10-CM | POA: Diagnosis not present

## 2022-11-09 DIAGNOSIS — R269 Unspecified abnormalities of gait and mobility: Secondary | ICD-10-CM | POA: Insufficient documentation

## 2022-11-09 NOTE — Therapy (Signed)
OUTPATIENT PHYSICAL THERAPY LE TREATMENT   Patient Name: Kelli [name] MRN: [id number] DOB:1945/06/13, 77 y.o., female Today's Date: 11/09/2022  END OF SESSION:  PT End of Session - 11/09/22 1448     Visit Number 9    Number of Visits 16    Date for PT Re-Evaluation 11/21/22    Authorization Type [employer] 2024 copay $15    PT Start Time 1448    PT Stop Time 1530    PT Time Calculation (min) 42 min    Activity Tolerance Patient tolerated treatment well    Behavior During Therapy [facility] for tasks assessed/performed              Past Medical History:  Diagnosis Date   Anxiety    Arthritis    Borderline diabetic    Colon cancer (HCC)    colon ca dx 02/2009, lung cancer   Depression    Depression with anxiety 09/19/2011   Diabetes mellitus type 2, uncomplicated (HCC) 12/31/2014   Genetic testing 06/06/2022   H/O colon cancer, stage II 09/19/2011   T3N0  Cecum Sept 2010   High cholesterol    Hyperlipidemia 09/19/2011   Lung nodules 09/19/2011   granulomas   Osteoporosis 2022   Urinary urgency    Past Surgical History:  Procedure Laterality Date   APPENDECTOMY     AUGMENTATION MAMMAPLASTY     BREAST LUMPECTOMY WITH RADIOACTIVE SEED LOCALIZATION Right 03/22/2022   Procedure: RIGHT BREAST LUMPECTOMY WITH RADIOACTIVE SEED LOCALIZATION;  Surgeon: Almond Lint, MD;  Location: [facility];  Service: General;  Laterality: Right;   BREAST SURGERY  1978   augmentation   COLON SURGERY     2010   CRYO INTERCOSTAL NERVE BLOCK N/A 10/05/2014   Procedure: CRYO INTERCOSTAL NERVE BLOCK;  Surgeon: Loreli Slot, MD;  Location: [facility] OR;  Service: Thoracic;  Laterality: N/A;   EYE SURGERY     cataracts   LYMPH NODE DISSECTION Right 10/05/2014   Procedure: LYMPH NODE DISSECTION;  Surgeon: Loreli Slot, MD;  Location: [facility] OR;  Service: Thoracic;  Laterality: Right;   ORIF HUMERUS FRACTURE Left 10/01/2018   Procedure: OPEN REDUCTION INTERNAL FIXATION  (ORIF) LEFT PROXIMAL HUMERUS FRACTURE;  Surgeon: Cammy Copa, MD;  Location: [facility] OR;  Service: Orthopedics;  Laterality: Left;   PARTIAL COLECTOMY Right 2010   SEGMENTECOMY Right 10/05/2014   Procedure: right lower lobe SEGMENTECTOMY;  Surgeon: Loreli Slot, MD;  Location: [facility] OR;  Service: Thoracic;  Laterality: Right;   VIDEO ASSISTED THORACOSCOPY (VATS)/WEDGE RESECTION Right 10/05/2014   Procedure: Right VIDEO ASSISTED THORACOSCOPY (VATS) with right lower lobe lung nodule WEDGE RESECTION;  Surgeon: Loreli Slot, MD;  Location: [facility] OR;  Service: Thoracic;  Laterality: Right;   Patient Active Problem List   Diagnosis Date Noted   Sebaceous cyst 06/20/2022   Genetic testing 06/06/2022   Malignant neoplasm of upper-outer quadrant of right breast in female, estrogen receptor positive (HCC) 03/06/2022   Avulsion fracture of left ankle 10/27/2021   Skin lesion 07/05/2021   Insomnia 03/31/2021   Osteoporosis 11/12/2020   Closed fracture of left proximal humerus 09/26/2018   Squamous cell carcinoma 03/05/2017   Echocardiogram shows left ventricular diastolic dysfunction 08/21/2016   Heart palpitations 07/25/2016   Need for vaccination for pneumococcus 07/13/2016   Osteoarthritis of hand, right 08/06/2015   Diabetes mellitus type 2, uncomplicated (HCC) 12/31/2014   Lung cancer, lower lobe (HCC) 10/09/2014   Hematochezia 04/01/2013   H/O  colon cancer, stage II 09/19/2011   Depression with anxiety 09/19/2011   Hyperlipidemia 09/19/2011    PCP: Dr Everrett Coombe  REFERRING PROVIDER: Dr Clementeen Graham   REFERRING DIAG: bilateral leg weakness   THERAPY DIAG:  Muscle weakness (generalized)  Other symptoms and signs involving the musculoskeletal system  Stiffness of both knees  Rationale for Evaluation and Treatment: Rehabilitation  ONSET DATE: 07/20/22  SUBJECTIVE:                                                                                                                                                                                       SUBJECTIVE STATEMENT: Patient reports her knees continue to feel like they may buckle, states sometimes her R knee feels like it needs to pop but no pain.  Hand dominance: Right  PERTINENT HISTORY: Fx Lt shoulder with ORIF; chronic Rt shoulder pain; HTN; arthritis; osteoporosis; history of cancer colon, lung, breast; fx Lt foot; hx of LBP    PAIN:  Are you having pain? Yes: NPRS scale: 0/10 Pain location:  Pain description:  Aggravating factors:  Relieving factors:   PRECAUTIONS:   WEIGHT BEARING RESTRICTIONS: No  FALLS:  Has patient fallen in last 6 months? No  LIVING ENVIRONMENT: Lives with: lives alone Lives in: House/apartment Stairs: level entry; one level  Has following equipment at home: None  OCCUPATION: Retired; Primary school teacher Activities: household chores; water exercise in summer; travel; dog sitting   PLOF: Independent  PATIENT GOALS: get stronger  NEXT MD VISIT: 01/15/23  OBJECTIVE:   DIAGNOSTIC FINDINGS:  None for knees    POSTURE: Patient presents with head forward posture with increased thoracic kyphosis; shoulders rounded and elevated; scapulae abducted and rotated along the thoracic spine; head of the humerus anterior in orientation; slightly flexed forward at hips   LOWER EXTREMITY ROM:     Active  Right eval Left eval Right 10/23/22 Left 10/23/22  Hip flexion      Hip extension      Hip abduction      Hip adduction      Hip internal rotation      Hip external rotation      Knee flexion 137 135    Knee extension -10 -4 -3 0  Ankle dorsiflexion 5 0    Ankle plantarflexion      Ankle inversion      Ankle eversion       (Blank rows = not tested)   LOWER EXTREMITY MMT:    MMT Right eval Left eval  Hip flexion 4+ 4+  Hip extension 4- 4-  Hip abduction 4+ 4+  Hip adduction    Hip  internal rotation    Hip external rotation    Knee flexion 5 5  Knee  extension 5 5  Ankle dorsiflexion 4+ 4  Ankle plantarflexion 4 4  Ankle inversion    Ankle eversion     (Blank rows = not tested)   5 times sit to stand: 13.65 sec   [facility] Adult PT Treatment:                                                DATE: 11/09/2022 Therapeutic Exercise: NuStep L7 x 5 min Gastroc stretch 3x30" on slantboard  SLR 4#AW: parallel & ER 12x5" (B) Standing HS curls 4#AW x15 (B) Leg press DL 60#  A54 --> SL 09# W11 each --> wide DL 914# N82    [facility] Adult PT Treatment:                                                DATE: 10/31/22 Therapeutic Exercise: Recumbent bike L2 x 5 min Gastroc stretch x30 sec  Soleus stretch x30 sec SAQ 4# W 10x5" SLR small range ER 4# AW 10x5" S/L hip abd 4# AW 2x10 Leg press: DL 95#  Resisted walking around ankle 5# L & R   [facility] Adult PT Treatment:                                                DATE: 10/26/2022 Therapeutic Exercise: Gastroc stretch on slant board 3x30" 4#AW SAQ green bolster 10x5" (B) SLR small range: parallel/ER/IR 10x5" each  5#AW LAQ 10x5" Standing squats --> cradling 10#KB Leg press: DL 62# --> ball b/w knees     PATIENT EDUCATION: Education details: Updated HEP  Person educated: Patient Education method: Explanation, Demonstration, Tactile cues, Verbal cues, and Handouts Education comprehension: verbalized understanding, returned demonstration, verbal cues required, tactile cues required, and needs further education  HOME EXERCISE PROGRAM: Access Code: [email or web address] URL: [email or web address] Date: 09/28/2022 Prepared by: Carlynn Herald  Exercises - Seated Hamstring Stretch  - 2 x daily - 7 x weekly - 1 sets - 3 reps - 30 sec  hold - Sit to Stand  - 2 x daily - 7 x weekly - 1-2 sets - 10 reps - 3-5 sec  hold - Gastroc Stretch on Wall  - 2 x daily - 7 x weekly - 1 sets - 3 reps - 30 sec  hold - Soleus Stretch on Wall  - 2 x daily - 7 x weekly - 1 sets - 3 reps - 30 sec  hold - Clam with  Resistance  - 1 x daily - 7 x weekly - 3 sets - 10 reps - Seated Knee Extension with Resistance  - 1 x daily - 7 x weekly - 3 sets - 10 reps  ASSESSMENT:  CLINICAL IMPRESSION:  Focus on quad strength continued; single leg press added. Hamstring curls modified from prone to standing due to knee discomfort with flexion in prone; tactile cues provided to improve pelvic/hip alignment and increase hamstring activation.   OBJECTIVE IMPAIRMENTS: decreased activity tolerance, decreased mobility, decreased ROM, decreased strength, increased  fascial restrictions, increased muscle spasms, impaired flexibility, impaired UE functional use, improper body mechanics, postural dysfunction, and pain.    GOALS: Goals reviewed with patient? Yes  SHORT TERM GOALS: Target date: 10/24/2022  Independent in initial HEP  Goal status: MET  2.  Increase Rt knee extension to (-) 5 and Lt knee extension to 0 degrees  Baseline:  Goal status: MET   LONG TERM GOALS: Target date: 11/21/2022  Increase bilat LE strength to 5-/5 to 5/5  Baseline:  Goal status: INITIAL  2.  Increased Rt knee extension to (-) 2 to 0 degrees  Baseline:  Goal status: IN PROGRESS  3.  Improve times sit to stand to </= 12 sec  Baseline:  Goal status: MET (11 sec)  4.  Patient reports ability to walk for 20 min without sensation of weakness or "leg giving away" Baseline:  Goal status: MET  5.  Independent in HEP including aquatic program  Baseline:  Goal status: INITIAL   PLAN:  PT FREQUENCY: 2x/week  PT DURATION: 8 weeks  PLANNED INTERVENTIONS: Therapeutic exercises, Therapeutic activity, Neuromuscular re-education, Patient/Family education, Self Care, Joint mobilization, Aquatic Therapy, Dry Needling, Electrical stimulation, Spinal mobilization, Cryotherapy, Moist heat, Taping, Vasopneumatic device, Ultrasound, Ionotophoresis 4mg /ml Dexamethasone, Manual therapy, and Re-evaluation  PLAN FOR NEXT SESSION: Progress knee  strengthening. Continue with postural correction and education; manual work, DN, modalities as indicated.    Sanjuana Mae, PTA 11/09/2022, 3:31 PM

## 2022-11-14 ENCOUNTER — Ambulatory Visit: Payer: No Typology Code available for payment source | Admitting: Rehabilitative and Restorative Service Providers"

## 2022-11-14 ENCOUNTER — Encounter: Payer: Self-pay | Admitting: Rehabilitative and Restorative Service Providers"

## 2022-11-14 DIAGNOSIS — M6281 Muscle weakness (generalized): Secondary | ICD-10-CM | POA: Diagnosis not present

## 2022-11-14 DIAGNOSIS — R29898 Other symptoms and signs involving the musculoskeletal system: Secondary | ICD-10-CM

## 2022-11-14 DIAGNOSIS — M25661 Stiffness of right knee, not elsewhere classified: Secondary | ICD-10-CM

## 2022-11-14 NOTE — Therapy (Signed)
OUTPATIENT PHYSICAL THERAPY LE TREATMENT and medicare 10th visit note  Progress Note Reporting Period 09/26/22 to 11/14/22  See note below for Objective Data and Assessment of Progress/Goals.     Patient Name: Kelli [name] MRN: [id number] DOB:July 04, 1945, 77 y.o., female Today's Date: 11/14/2022  END OF SESSION:  PT End of Session - 11/14/22 1542     Visit Number 10    Number of Visits 16    Date for PT Re-Evaluation 11/21/22    Authorization Type [employer] 2024 copay $15    Progress Note Due on Visit 10    PT Start Time 1535    PT Stop Time 1615    PT Time Calculation (min) 40 min    Activity Tolerance Patient tolerated treatment well              Past Medical History:  Diagnosis Date   Anxiety    Arthritis    Borderline diabetic    Colon cancer (HCC)    colon ca dx 02/2009, lung cancer   Depression    Depression with anxiety 09/19/2011   Diabetes mellitus type 2, uncomplicated (HCC) 12/31/2014   Genetic testing 06/06/2022   H/O colon cancer, stage II 09/19/2011   T3N0  Cecum Sept 2010   High cholesterol    Hyperlipidemia 09/19/2011   Lung nodules 09/19/2011   granulomas   Osteoporosis 2022   Urinary urgency    Past Surgical History:  Procedure Laterality Date   APPENDECTOMY     AUGMENTATION MAMMAPLASTY     BREAST LUMPECTOMY WITH RADIOACTIVE SEED LOCALIZATION Right 03/22/2022   Procedure: RIGHT BREAST LUMPECTOMY WITH RADIOACTIVE SEED LOCALIZATION;  Surgeon: Almond Lint, MD;  Location: [facility];  Service: General;  Laterality: Right;   BREAST SURGERY  1978   augmentation   COLON SURGERY     2010   CRYO INTERCOSTAL NERVE BLOCK N/A 10/05/2014   Procedure: CRYO INTERCOSTAL NERVE BLOCK;  Surgeon: Loreli Slot, MD;  Location: [facility] OR;  Service: Thoracic;  Laterality: N/A;   EYE SURGERY     cataracts   LYMPH NODE DISSECTION Right 10/05/2014   Procedure: LYMPH NODE DISSECTION;  Surgeon: Loreli Slot, MD;  Location: [facility] OR;   Service: Thoracic;  Laterality: Right;   ORIF HUMERUS FRACTURE Left 10/01/2018   Procedure: OPEN REDUCTION INTERNAL FIXATION (ORIF) LEFT PROXIMAL HUMERUS FRACTURE;  Surgeon: Cammy Copa, MD;  Location: [facility] OR;  Service: Orthopedics;  Laterality: Left;   PARTIAL COLECTOMY Right 2010   SEGMENTECOMY Right 10/05/2014   Procedure: right lower lobe SEGMENTECTOMY;  Surgeon: Loreli Slot, MD;  Location: [facility] OR;  Service: Thoracic;  Laterality: Right;   VIDEO ASSISTED THORACOSCOPY (VATS)/WEDGE RESECTION Right 10/05/2014   Procedure: Right VIDEO ASSISTED THORACOSCOPY (VATS) with right lower lobe lung nodule WEDGE RESECTION;  Surgeon: Loreli Slot, MD;  Location: [facility] OR;  Service: Thoracic;  Laterality: Right;   Patient Active Problem List   Diagnosis Date Noted   Sebaceous cyst 06/20/2022   Genetic testing 06/06/2022   Malignant neoplasm of upper-outer quadrant of right breast in female, estrogen receptor positive (HCC) 03/06/2022   Avulsion fracture of left ankle 10/27/2021   Skin lesion 07/05/2021   Insomnia 03/31/2021   Osteoporosis 11/12/2020   Closed fracture of left proximal humerus 09/26/2018   Squamous cell carcinoma 03/05/2017   Echocardiogram shows left ventricular diastolic dysfunction 08/21/2016   Heart palpitations 07/25/2016   Need for vaccination for pneumococcus 07/13/2016   Osteoarthritis of hand, right  08/06/2015   Diabetes mellitus type 2, uncomplicated (HCC) 12/31/2014   Lung cancer, lower lobe (HCC) 10/09/2014   Hematochezia 04/01/2013   H/O colon cancer, stage II 09/19/2011   Depression with anxiety 09/19/2011   Hyperlipidemia 09/19/2011    PCP: Dr Everrett Coombe  REFERRING PROVIDER: Dr Clementeen Graham   REFERRING DIAG: bilateral leg weakness   THERAPY DIAG:  Muscle weakness (generalized)  Other symptoms and signs involving the musculoskeletal system  Stiffness of both knees  Rationale for Evaluation and Treatment: Rehabilitation  ONSET DATE:  07/20/22  SUBJECTIVE:                                                                                                                                                                                      SUBJECTIVE STATEMENT: Patient reports her R knee continue to feel like "it's slipping". Same it has felt since the beginning. Her knees have not buckled but feels it is "giving away".     Hand dominance: Right  PERTINENT HISTORY: Fx Lt shoulder with ORIF; chronic Rt shoulder pain; HTN; arthritis; osteoporosis; history of cancer colon, lung, breast; fx Lt foot; hx of LBP    PAIN:  Are you having pain? Yes: NPRS scale: 0/10 Pain location:  Pain description:  Aggravating factors:  Relieving factors:   PRECAUTIONS:   WEIGHT BEARING RESTRICTIONS: No  FALLS:  Has patient fallen in last 6 months? No  LIVING ENVIRONMENT: Lives with: lives alone Lives in: House/apartment Stairs: level entry; one level  Has following equipment at home: None  OCCUPATION: Retired; Primary school teacher Activities: household chores; water exercise in summer; travel; dog sitting   PLOF: Independent  PATIENT GOALS: get stronger  NEXT MD VISIT: 01/15/23  OBJECTIVE:   DIAGNOSTIC FINDINGS:  None for knees    POSTURE: Patient presents with head forward posture with increased thoracic kyphosis; shoulders rounded and elevated; scapulae abducted and rotated along the thoracic spine; head of the humerus anterior in orientation; slightly flexed forward at hips   LOWER EXTREMITY ROM:     Active  Right eval Left eval Right 10/23/22 Left 10/23/22  Hip flexion      Hip extension      Hip abduction      Hip adduction      Hip internal rotation      Hip external rotation      Knee flexion 137 135    Knee extension -10 -4 -3 0  Ankle dorsiflexion 5 0    Ankle plantarflexion      Ankle inversion      Ankle eversion       (Blank rows = not tested)   LOWER  EXTREMITY MMT:    MMT Right eval Left eval  Right  11/14/22 Left  11/14/22  Hip flexion 4+ 4+ 5- 5-  Hip extension 4- 4- 4 4  Hip abduction 4+ 4+ 4+ 4+  Hip adduction      Hip internal rotation      Hip external rotation      Knee flexion 5 5 5 5   Knee extension 5 5 5 5   Ankle dorsiflexion 4+ 4    Ankle plantarflexion 4 4    Ankle inversion      Ankle eversion       (Blank rows = not tested)   5 times sit to stand: 13.65 sec   [facility] Adult PT Treatment:                                                DATE: 11/14/2022 Therapeutic Exercise: Recumbent bike L3 x 5 min Mini squat w/ball btn knees 5 sec x 10  Adductor squeeze with ball 5 sec x 10  Bridging w/ ball squeeze 5 sec x 10  SLR in ER 5 sec x 10 R/L Gastroc stretch 3x30" on slantboard  Manual:  Kinesotaping to correct lateral patella alignment bilat   [facility] Adult PT Treatment:                                                DATE: 11/09/2022 Therapeutic Exercise: NuStep L7 x 5 min Gastroc stretch 3x30" on slantboard  SLR 4#AW: parallel & ER 12x5" (B) Standing HS curls 4#AW x15 (B) Leg press DL 16#  X09 --> SL 60# A54 each --> wide DL 098# J19    [facility] Adult PT Treatment:                                                DATE: 10/31/22 Therapeutic Exercise: Recumbent bike L2 x 5 min Gastroc stretch x30 sec  Soleus stretch x30 sec SAQ 4# W 10x5" SLR small range ER 4# AW 10x5" S/L hip abd 4# AW 2x10 Leg press: DL 14#  Resisted walking around ankle 5# L & R    PATIENT EDUCATION: Education details: Updated HEP  Person educated: Patient Education method: Explanation, Demonstration, Tactile cues, Verbal cues, and Handouts Education comprehension: verbalized understanding, returned demonstration, verbal cues required, tactile cues required, and needs further education  HOME EXERCISE PROGRAM: Access Code: [email or web address] URL: [email or web address] Date: 09/28/2022 Prepared by: Carlynn Herald  Exercises - Seated Hamstring Stretch  - 2 x daily - 7 x weekly - 1  sets - 3 reps - 30 sec  hold - Sit to Stand  - 2 x daily - 7 x weekly - 1-2 sets - 10 reps - 3-5 sec  hold - Gastroc Stretch on Wall  - 2 x daily - 7 x weekly - 1 sets - 3 reps - 30 sec  hold - Soleus Stretch on Wall  - 2 x daily - 7 x weekly - 1 sets - 3 reps - 30 sec  hold - Clam with Resistance  - 1 x daily - 7 x  weekly - 3 sets - 10 reps - Seated Knee Extension with Resistance  - 1 x daily - 7 x weekly - 3 sets - 10 reps Education:  - Kinesptaping handout   ASSESSMENT:  CLINICAL IMPRESSION:  Continued sensation of "slipping" per pt report. LE strength is increasing. She does have lateral tracking of patella upon exam. Trial of kinesotaping to correct patellar alignment bilat LE's. Added exercises for strengthening medial quad. Assess response to taping at next visit.    OBJECTIVE IMPAIRMENTS: decreased activity tolerance, decreased mobility, decreased ROM, decreased strength, increased fascial restrictions, increased muscle spasms, impaired flexibility, impaired UE functional use, improper body mechanics, postural dysfunction, and pain.    GOALS: Goals reviewed with patient? Yes  SHORT TERM GOALS: Target date: 10/24/2022  Independent in initial HEP  Goal status: MET  2.  Increase Rt knee extension to (-) 5 and Lt knee extension to 0 degrees  Baseline:  Goal status: MET   LONG TERM GOALS: Target date: 11/21/2022  Increase bilat LE strength to 5-/5 to 5/5  Baseline:  Goal status: INITIAL  2.  Increased Rt knee extension to (-) 2 to 0 degrees  Baseline:  Goal status: IN PROGRESS  3.  Improve times sit to stand to </= 12 sec  Baseline:  Goal status: MET (11 sec)  4.  Patient reports ability to walk for 20 min without sensation of weakness or "leg giving away" Baseline:  Goal status: MET  5.  Independent in HEP including aquatic program  Baseline:  Goal status: INITIAL   PLAN:  PT FREQUENCY: 2x/week  PT DURATION: 8 weeks  PLANNED INTERVENTIONS: Therapeutic  exercises, Therapeutic activity, Neuromuscular re-education, Patient/Family education, Self Care, Joint mobilization, Aquatic Therapy, Dry Needling, Electrical stimulation, Spinal mobilization, Cryotherapy, Moist heat, Taping, Vasopneumatic device, Ultrasound, Ionotophoresis 4mg /ml Dexamethasone, Manual therapy, and Re-evaluation  PLAN FOR NEXT SESSION: Progress knee strengthening. Continue with postural correction and education; manual work, DN, modalities as indicated.    Val Riles, PT 11/14/2022, 3:43 PM

## 2022-11-16 ENCOUNTER — Ambulatory Visit: Payer: No Typology Code available for payment source | Admitting: Rehabilitative and Restorative Service Providers"

## 2022-11-16 ENCOUNTER — Encounter: Payer: Self-pay | Admitting: Rehabilitative and Restorative Service Providers"

## 2022-11-16 DIAGNOSIS — M25572 Pain in left ankle and joints of left foot: Secondary | ICD-10-CM

## 2022-11-16 DIAGNOSIS — M25661 Stiffness of right knee, not elsewhere classified: Secondary | ICD-10-CM

## 2022-11-16 DIAGNOSIS — R29898 Other symptoms and signs involving the musculoskeletal system: Secondary | ICD-10-CM

## 2022-11-16 DIAGNOSIS — M6281 Muscle weakness (generalized): Secondary | ICD-10-CM

## 2022-11-16 DIAGNOSIS — R269 Unspecified abnormalities of gait and mobility: Secondary | ICD-10-CM

## 2022-11-16 NOTE — Therapy (Signed)
OUTPATIENT PHYSICAL THERAPY LE TREATMENT  Reporting Period 09/26/22 to 11/14/22  See note below for Objective Data and Assessment of Progress/Goals.     Patient Name: Kelli [name] MRN: [id number] DOB:1946-03-12, 77 y.o., female Today's Date: 11/16/2022  END OF SESSION:  PT End of Session - 11/16/22 1532     Visit Number 11    Number of Visits 16    Date for PT Re-Evaluation 11/21/22    Authorization Type [employer] 2024 copay $15    Progress Note Due on Visit 20    PT Start Time 1530    PT Stop Time 1615    PT Time Calculation (min) 45 min              Past Medical History:  Diagnosis Date   Anxiety    Arthritis    Borderline diabetic    Colon cancer (HCC)    colon ca dx 02/2009, lung cancer   Depression    Depression with anxiety 09/19/2011   Diabetes mellitus type 2, uncomplicated (HCC) 12/31/2014   Genetic testing 06/06/2022   H/O colon cancer, stage II 09/19/2011   T3N0  Cecum Sept 2010   High cholesterol    Hyperlipidemia 09/19/2011   Lung nodules 09/19/2011   granulomas   Osteoporosis 2022   Urinary urgency    Past Surgical History:  Procedure Laterality Date   APPENDECTOMY     AUGMENTATION MAMMAPLASTY     BREAST LUMPECTOMY WITH RADIOACTIVE SEED LOCALIZATION Right 03/22/2022   Procedure: RIGHT BREAST LUMPECTOMY WITH RADIOACTIVE SEED LOCALIZATION;  Surgeon: Almond Lint, MD;  Location: [facility];  Service: General;  Laterality: Right;   BREAST SURGERY  1978   augmentation   COLON SURGERY     2010   CRYO INTERCOSTAL NERVE BLOCK N/A 10/05/2014   Procedure: CRYO INTERCOSTAL NERVE BLOCK;  Surgeon: Loreli Slot, MD;  Location: [facility] OR;  Service: Thoracic;  Laterality: N/A;   EYE SURGERY     cataracts   LYMPH NODE DISSECTION Right 10/05/2014   Procedure: LYMPH NODE DISSECTION;  Surgeon: Loreli Slot, MD;  Location: [facility] OR;  Service: Thoracic;  Laterality: Right;   ORIF HUMERUS FRACTURE Left 10/01/2018   Procedure: OPEN  REDUCTION INTERNAL FIXATION (ORIF) LEFT PROXIMAL HUMERUS FRACTURE;  Surgeon: Cammy Copa, MD;  Location: [facility] OR;  Service: Orthopedics;  Laterality: Left;   PARTIAL COLECTOMY Right 2010   SEGMENTECOMY Right 10/05/2014   Procedure: right lower lobe SEGMENTECTOMY;  Surgeon: Loreli Slot, MD;  Location: [facility] OR;  Service: Thoracic;  Laterality: Right;   VIDEO ASSISTED THORACOSCOPY (VATS)/WEDGE RESECTION Right 10/05/2014   Procedure: Right VIDEO ASSISTED THORACOSCOPY (VATS) with right lower lobe lung nodule WEDGE RESECTION;  Surgeon: Loreli Slot, MD;  Location: [facility] OR;  Service: Thoracic;  Laterality: Right;   Patient Active Problem List   Diagnosis Date Noted   Sebaceous cyst 06/20/2022   Genetic testing 06/06/2022   Malignant neoplasm of upper-outer quadrant of right breast in female, estrogen receptor positive (HCC) 03/06/2022   Avulsion fracture of left ankle 10/27/2021   Skin lesion 07/05/2021   Insomnia 03/31/2021   Osteoporosis 11/12/2020   Closed fracture of left proximal humerus 09/26/2018   Squamous cell carcinoma 03/05/2017   Echocardiogram shows left ventricular diastolic dysfunction 08/21/2016   Heart palpitations 07/25/2016   Need for vaccination for pneumococcus 07/13/2016   Osteoarthritis of hand, right 08/06/2015   Diabetes mellitus type 2, uncomplicated (HCC) 12/31/2014   Lung cancer, lower lobe (  HCC) 10/09/2014   Hematochezia 04/01/2013   H/O colon cancer, stage II 09/19/2011   Depression with anxiety 09/19/2011   Hyperlipidemia 09/19/2011    PCP: Dr Everrett Coombe  REFERRING PROVIDER: Dr Clementeen Graham   REFERRING DIAG: bilateral leg weakness   THERAPY DIAG:  Muscle weakness (generalized)  Other symptoms and signs involving the musculoskeletal system  Stiffness of both knees  Acute left ankle pain  Abnormal gait  Rationale for Evaluation and Treatment: Rehabilitation  ONSET DATE: 07/20/22  SUBJECTIVE:                                                                                                                                                                                       SUBJECTIVE STATEMENT: Patient reports her knees have not felt the "slipping" sensation. Tape did not stay on very long but may have helped. Her knees have not buckled but feels it is "giving away".     Hand dominance: Right  PERTINENT HISTORY: Fx Lt shoulder with ORIF; chronic Rt shoulder pain; HTN; arthritis; osteoporosis; history of cancer colon, lung, breast; fx Lt foot; hx of LBP    PAIN:  Are you having pain? Yes: NPRS scale: 0/10 Pain location:  Pain description:  Aggravating factors:  Relieving factors:   PRECAUTIONS:   WEIGHT BEARING RESTRICTIONS: No  FALLS:  Has patient fallen in last 6 months? No  LIVING ENVIRONMENT: Lives with: lives alone Lives in: House/apartment Stairs: level entry; one level  Has following equipment at home: None  OCCUPATION: Retired; Primary school teacher Activities: household chores; water exercise in summer; travel; dog sitting   PLOF: Independent  PATIENT GOALS: get stronger  NEXT MD VISIT: 01/15/23  OBJECTIVE:   DIAGNOSTIC FINDINGS:  None for knees    POSTURE: Patient presents with head forward posture with increased thoracic kyphosis; shoulders rounded and elevated; scapulae abducted and rotated along the thoracic spine; head of the humerus anterior in orientation; slightly flexed forward at hips   LOWER EXTREMITY ROM:     Active  Right eval Left eval Right 10/23/22 Left 10/23/22  Hip flexion      Hip extension      Hip abduction      Hip adduction      Hip internal rotation      Hip external rotation      Knee flexion 137 135    Knee extension -10 -4 -3 0  Ankle dorsiflexion 5 0    Ankle plantarflexion      Ankle inversion      Ankle eversion       (Blank rows = not tested)   LOWER EXTREMITY MMT:  MMT Right eval Left eval Right  11/14/22 Left  11/14/22  Hip flexion 4+  4+ 5- 5-  Hip extension 4- 4- 4 4  Hip abduction 4+ 4+ 4+ 4+  Hip adduction      Hip internal rotation      Hip external rotation      Knee flexion 5 5 5 5   Knee extension 5 5 5 5   Ankle dorsiflexion 4+ 4    Ankle plantarflexion 4 4    Ankle inversion      Ankle eversion       (Blank rows = not tested)   5 times sit to stand: 13.65 sec   [facility] Adult PT Treatment:                                                DATE: 11/16/2022 Therapeutic Exercise: Nustep L6 x 6 min Mini squat w/ball btn knees 5 sec x 10  Adductor squeeze with ball 5 sec x 10  Bridging w/ ball squeeze 5 sec x 10  SLR in ER 5 sec x 10 R/L Gastroc stretch 3x30" on slantboard  Manual: Skilled palpation to assess response to manual work and DN Trigger Point Dry-Needling  Treatment instructions: Expect mild to moderate muscle soreness. S/S of pneumothorax if dry needled over a lung field, and to seek immediate medical attention should they occur. Patient verbalized understanding of these instructions and education.  Patient Consent Given: Yes Education handout provided: Yes Muscles treated: R/L quad  Electrical stimulation performed: Yes Parameters: mAmp current intensity to tolerance  Treatment response/outcome: decreased palpable tightness    IASTM bilat lateral quads   [facility] Adult PT Treatment:                                                DATE: 11/14/2022 Therapeutic Exercise: Recumbent bike L3 x 5 min Mini squat w/ball btn knees 5 sec x 10  Adductor squeeze with ball 5 sec x 10  Bridging w/ ball squeeze 5 sec x 10  SLR in ER 5 sec x 10 R/L Gastroc stretch 3x30" on slantboard  Manual:  Kinesotaping to correct lateral patella alignment bilat   [facility] Adult PT Treatment:                                                DATE: 11/09/2022 Therapeutic Exercise: NuStep L7 x 5 min Gastroc stretch 3x30" on slantboard  SLR 4#AW: parallel & ER 12x5" (B) Standing HS curls 4#AW x15 (B) Leg press DL 16#  X09 --> SL 60#  A54 each --> wide DL 098# J19    PATIENT EDUCATION: Education details: Updated HEP  Person educated: Patient Education method: Explanation, Demonstration, Tactile cues, Verbal cues, and Handouts Education comprehension: verbalized understanding, returned demonstration, verbal cues required, tactile cues required, and needs further education  HOME EXERCISE PROGRAM: Access Code: [email or web address] URL: [email or web address] Date: 09/28/2022 Prepared by: Carlynn Herald  Exercises - Seated Hamstring Stretch  - 2 x daily - 7 x weekly - 1 sets - 3 reps - 30 sec  hold - Sit to  Stand  - 2 x daily - 7 x weekly - 1-2 sets - 10 reps - 3-5 sec  hold - Gastroc Stretch on Wall  - 2 x daily - 7 x weekly - 1 sets - 3 reps - 30 sec  hold - Soleus Stretch on Wall  - 2 x daily - 7 x weekly - 1 sets - 3 reps - 30 sec  hold - Clam with Resistance  - 1 x daily - 7 x weekly - 3 sets - 10 reps - Seated Knee Extension with Resistance  - 1 x daily - 7 x weekly - 3 sets - 10 reps Education:  - Kinesptaping handout   ASSESSMENT:  CLINICAL IMPRESSION:  Decreased sensation of "slipping" per pt report. Trial of DN and manual work bilat lateral quads with good release of muscular tightness. LE strength is increasing. She does have lateral tracking of patella upon exam.   OBJECTIVE IMPAIRMENTS: decreased activity tolerance, decreased mobility, decreased ROM, decreased strength, increased fascial restrictions, increased muscle spasms, impaired flexibility, impaired UE functional use, improper body mechanics, postural dysfunction, and pain.    GOALS: Goals reviewed with patient? Yes  SHORT TERM GOALS: Target date: 10/24/2022  Independent in initial HEP  Goal status: MET  2.  Increase Rt knee extension to (-) 5 and Lt knee extension to 0 degrees  Baseline:  Goal status: MET   LONG TERM GOALS: Target date: 11/21/2022  Increase bilat LE strength to 5-/5 to 5/5  Baseline:  Goal status: INITIAL  2.   Increased Rt knee extension to (-) 2 to 0 degrees  Baseline:  Goal status: IN PROGRESS  3.  Improve times sit to stand to </= 12 sec  Baseline:  Goal status: MET (11 sec)  4.  Patient reports ability to walk for 20 min without sensation of weakness or "leg giving away" Baseline:  Goal status: MET  5.  Independent in HEP including aquatic program  Baseline:  Goal status: INITIAL   PLAN:  PT FREQUENCY: 2x/week  PT DURATION: 8 weeks  PLANNED INTERVENTIONS: Therapeutic exercises, Therapeutic activity, Neuromuscular re-education, Patient/Family education, Self Care, Joint mobilization, Aquatic Therapy, Dry Needling, Electrical stimulation, Spinal mobilization, Cryotherapy, Moist heat, Taping, Vasopneumatic device, Ultrasound, Ionotophoresis 4mg /ml Dexamethasone, Manual therapy, and Re-evaluation  PLAN FOR NEXT SESSION: Progress knee strengthening. Continue with postural correction and education; manual work, DN, modalities as indicated.    Val Riles, PT 11/16/2022, 3:33 PM

## 2022-11-20 ENCOUNTER — Encounter: Payer: No Typology Code available for payment source | Admitting: Rehabilitative and Restorative Service Providers"

## 2022-11-23 ENCOUNTER — Ambulatory Visit: Payer: No Typology Code available for payment source | Admitting: Rehabilitative and Restorative Service Providers"

## 2022-11-23 ENCOUNTER — Encounter: Payer: Self-pay | Admitting: Rehabilitative and Restorative Service Providers"

## 2022-11-23 DIAGNOSIS — M6281 Muscle weakness (generalized): Secondary | ICD-10-CM

## 2022-11-23 DIAGNOSIS — R29898 Other symptoms and signs involving the musculoskeletal system: Secondary | ICD-10-CM

## 2022-11-23 DIAGNOSIS — M25661 Stiffness of right knee, not elsewhere classified: Secondary | ICD-10-CM

## 2022-11-23 NOTE — Therapy (Signed)
OUTPATIENT PHYSICAL THERAPY LE TREATMENT  Reporting Period 09/26/22 to 11/14/22  See note below for Objective Data and Assessment of Progress/Goals.     Patient Name: Kelli [name] MRN: [id number] DOB:12/28/1945, 77 y.o., female Today's Date: 11/23/2022  END OF SESSION:  PT End of Session - 11/23/22 1452     Visit Number 12    Number of Visits 28    Date for PT Re-Evaluation 01/04/23    Authorization Type [employer] 2024 copay $15    Progress Note Due on Visit 20    PT Start Time 1446    PT Stop Time 1534    PT Time Calculation (min) 48 min    Activity Tolerance Patient tolerated treatment well              Past Medical History:  Diagnosis Date   Anxiety    Arthritis    Borderline diabetic    Colon cancer (HCC)    colon ca dx 02/2009, lung cancer   Depression    Depression with anxiety 09/19/2011   Diabetes mellitus type 2, uncomplicated (HCC) 12/31/2014   Genetic testing 06/06/2022   H/O colon cancer, stage II 09/19/2011   T3N0  Cecum Sept 2010   High cholesterol    Hyperlipidemia 09/19/2011   Lung nodules 09/19/2011   granulomas   Osteoporosis 2022   Urinary urgency    Past Surgical History:  Procedure Laterality Date   APPENDECTOMY     AUGMENTATION MAMMAPLASTY     BREAST LUMPECTOMY WITH RADIOACTIVE SEED LOCALIZATION Right 03/22/2022   Procedure: RIGHT BREAST LUMPECTOMY WITH RADIOACTIVE SEED LOCALIZATION;  Surgeon: Almond Lint, MD;  Location: [facility];  Service: General;  Laterality: Right;   BREAST SURGERY  1978   augmentation   COLON SURGERY     2010   CRYO INTERCOSTAL NERVE BLOCK N/A 10/05/2014   Procedure: CRYO INTERCOSTAL NERVE BLOCK;  Surgeon: Loreli Slot, MD;  Location: [facility] OR;  Service: Thoracic;  Laterality: N/A;   EYE SURGERY     cataracts   LYMPH NODE DISSECTION Right 10/05/2014   Procedure: LYMPH NODE DISSECTION;  Surgeon: Loreli Slot, MD;  Location: [facility] OR;  Service: Thoracic;  Laterality: Right;    ORIF HUMERUS FRACTURE Left 10/01/2018   Procedure: OPEN REDUCTION INTERNAL FIXATION (ORIF) LEFT PROXIMAL HUMERUS FRACTURE;  Surgeon: Cammy Copa, MD;  Location: [facility] OR;  Service: Orthopedics;  Laterality: Left;   PARTIAL COLECTOMY Right 2010   SEGMENTECOMY Right 10/05/2014   Procedure: right lower lobe SEGMENTECTOMY;  Surgeon: Loreli Slot, MD;  Location: [facility] OR;  Service: Thoracic;  Laterality: Right;   VIDEO ASSISTED THORACOSCOPY (VATS)/WEDGE RESECTION Right 10/05/2014   Procedure: Right VIDEO ASSISTED THORACOSCOPY (VATS) with right lower lobe lung nodule WEDGE RESECTION;  Surgeon: Loreli Slot, MD;  Location: [facility] OR;  Service: Thoracic;  Laterality: Right;   Patient Active Problem List   Diagnosis Date Noted   Sebaceous cyst 06/20/2022   Genetic testing 06/06/2022   Malignant neoplasm of upper-outer quadrant of right breast in female, estrogen receptor positive (HCC) 03/06/2022   Avulsion fracture of left ankle 10/27/2021   Skin lesion 07/05/2021   Insomnia 03/31/2021   Osteoporosis 11/12/2020   Closed fracture of left proximal humerus 09/26/2018   Squamous cell carcinoma 03/05/2017   Echocardiogram shows left ventricular diastolic dysfunction 08/21/2016   Heart palpitations 07/25/2016   Need for vaccination for pneumococcus 07/13/2016   Osteoarthritis of hand, right 08/06/2015   Diabetes mellitus type 2,  uncomplicated (HCC) 12/31/2014   Lung cancer, lower lobe (HCC) 10/09/2014   Hematochezia 04/01/2013   H/O colon cancer, stage II 09/19/2011   Depression with anxiety 09/19/2011   Hyperlipidemia 09/19/2011    PCP: Dr Everrett Coombe  REFERRING PROVIDER: Dr Clementeen Graham   REFERRING DIAG: bilateral leg weakness   THERAPY DIAG:  Muscle weakness (generalized)  Other symptoms and signs involving the musculoskeletal system  Stiffness of both knees  Rationale for Evaluation and Treatment: Rehabilitation  ONSET DATE: 07/20/22  SUBJECTIVE:                                                                                                                                                                                       SUBJECTIVE STATEMENT: Patient reports pain in the Rt knee in the past hour which she has not had before. She still has the sensation of knees "giving away". She is now feeling the "slipping" sensation. Tape did not stay on very long to know if it helped. Her knees have not buckled but feels it is "giving away"     Hand dominance: Right  PERTINENT HISTORY: Fx Lt shoulder with ORIF; chronic Rt shoulder pain; HTN; arthritis; osteoporosis; history of cancer colon, lung, breast; fx Lt foot; hx of LBP    PAIN:  Are you having pain? Yes: NPRS scale: 6/10 Pain location: R knee  Pain description: sharp; lingering  Aggravating factors:  Relieving factors:   PRECAUTIONS:   WEIGHT BEARING RESTRICTIONS: No  FALLS:  Has patient fallen in last 6 months? No  LIVING ENVIRONMENT: Lives with: lives alone Lives in: House/apartment Stairs: level entry; one level  Has following equipment at home: None  OCCUPATION: Retired; Primary school teacher Activities: household chores; water exercise in summer; travel; dog sitting   PLOF: Independent  PATIENT GOALS: get stronger  NEXT MD VISIT: 01/15/23  OBJECTIVE:   DIAGNOSTIC FINDINGS:  None for knees    POSTURE: Patient presents with head forward posture with increased thoracic kyphosis; shoulders rounded and elevated; scapulae abducted and rotated along the thoracic spine; head of the humerus anterior in orientation; slightly flexed forward at hips   LOWER EXTREMITY ROM:     Active  Right eval Left eval Right 10/23/22 Left 10/23/22  Hip flexion      Hip extension      Hip abduction      Hip adduction      Hip internal rotation      Hip external rotation      Knee flexion 137 135    Knee extension -10 -4 -3 0  Ankle dorsiflexion 5 0    Ankle plantarflexion  Ankle inversion       Ankle eversion       (Blank rows = not tested)   LOWER EXTREMITY MMT:    MMT Right eval Left eval Right  11/14/22 Left  11/14/22  Hip flexion 4+ 4+ 5- 5-  Hip extension 4- 4- 4 4  Hip abduction 4+ 4+ 4+ 4+  Hip adduction      Hip internal rotation      Hip external rotation      Knee flexion 5 5 5 5   Knee extension 5 5 5 5   Ankle dorsiflexion 4+ 4    Ankle plantarflexion 4 4    Ankle inversion      Ankle eversion       (Blank rows = not tested)   5 times sit to stand: 13.65 sec   [facility] Adult PT Treatment:                                                DATE: 11/23/2022 Therapeutic Exercise: Nustep L5 x 6 min Hamstring stretch L/R 30 sec x 3 ITB stretch 30 sec x 3 L/R Quad stretch 30 sec x 3  Wall squat 10 sec x 10  Mini squat w/ball btn knees 5 sec x 10  Adductor squeeze with ball 5 sec x 10  Bridging w/ ball squeeze 5 sec x 10  SLR in ER 5 sec x 10 R/L Gastroc stretch 3x30" on slantboard  Manual:    [facility] Adult PT Treatment:                                                DATE: 11/16/2022 Therapeutic Exercise: Nustep L6 x 6 min Mini squat w/ball btn knees 5 sec x 10  Adductor squeeze with ball 5 sec x 10  Bridging w/ ball squeeze 5 sec x 10  SLR in ER 5 sec x 10 R/L Gastroc stretch 3x30" on slantboard  Manual: Skilled palpation to assess response to manual work and DN Trigger Point Dry-Needling  Treatment instructions: Expect mild to moderate muscle soreness. S/S of pneumothorax if dry needled over a lung field, and to seek immediate medical attention should they occur. Patient verbalized understanding of these instructions and education.  Patient Consent Given: Yes Education handout provided: Yes Muscles treated: R/L quad  Electrical stimulation performed: Yes Parameters: mAmp current intensity to tolerance  Treatment response/outcome: decreased palpable tightness    IASTM bilat lateral quads   [facility] Adult PT Treatment:                                                 DATE: 11/09/2022 Therapeutic Exercise: NuStep L7 x 5 min Gastroc stretch 3x30" on slantboard  SLR 4#AW: parallel & ER 12x5" (B) Standing HS curls 4#AW x15 (B) Leg press DL 40#  J81 --> SL 19# J47 each --> wide DL 829# F62    PATIENT EDUCATION: Education details: Updated HEP  Person educated: Patient Education method: Explanation, Demonstration, Tactile cues, Verbal cues, and Handouts Education comprehension: verbalized understanding, returned demonstration, verbal cues required, tactile cues required,  and needs further education  HOME EXERCISE PROGRAM: Access Code: [email or web address] URL: [email or web address] Date: 11/23/2022 Prepared by: Corlis Leak  Exercises - Seated Hamstring Stretch  - 2 x daily - 7 x weekly - 1 sets - 3 reps - 30 sec  hold - Sit to Stand  - 2 x daily - 7 x weekly - 1-2 sets - 10 reps - 3-5 sec  hold - Gastroc Stretch on Wall  - 2 x daily - 7 x weekly - 1 sets - 3 reps - 30 sec  hold - Soleus Stretch on Wall  - 2 x daily - 7 x weekly - 1 sets - 3 reps - 30 sec  hold - Clam with Resistance  - 1 x daily - 7 x weekly - 3 sets - 10 reps - Seated Knee Extension with Resistance  - 1 x daily - 7 x weekly - 3 sets - 10 reps - Hooklying Hamstring Stretch with Strap  - 2 x daily - 7 x weekly - 1 sets - 3 reps - 30 sec  hold - Supine ITB Stretch with Strap  - 2 x daily - 7 x weekly - 1 sets - 3 reps - 30 sec  hold - Prone Quadriceps Stretch with Strap  - 2 x daily - 7 x weekly - 1 sets - 3 reps - 30 sec  hold - Seated Hip Flexor Stretch  - 2 x daily - 7 x weekly - 1 sets - 3 reps - 30 sec  hold Education:  - Kinesptaping handout   ASSESSMENT:  CLINICAL IMPRESSION:  Increased pain in the R knee for ~ 1 hour before treatment today. Note increased tightness in the quads; HS; ITB. Added stretching for LE's and continued with strengthening. Patient preformed sit to stand with greater ease following stretching. Patient demonstrates increased strength in bilat  LE's. Sensation of "giving away" persists. Patient is working toward stated goals of therapy and will benefit from continued treatment.    OBJECTIVE IMPAIRMENTS: decreased activity tolerance, decreased mobility, decreased ROM, decreased strength, increased fascial restrictions, increased muscle spasms, impaired flexibility, impaired UE functional use, improper body mechanics, postural dysfunction, and pain.    GOALS: Goals reviewed with patient? Yes  SHORT TERM GOALS: Target date: 10/24/2022  Independent in initial HEP  Goal status: MET  2.  Increase Rt knee extension to (-) 5 and Lt knee extension to 0 degrees  Baseline:  Goal status: MET   LONG TERM GOALS: Target date: 01/04/2023  Increase bilat LE strength to 5-/5 to 5/5  Baseline:  Goal status: on going   2.  Increased Rt knee extension to (-) 2 to 0 degrees  Baseline:  Goal status: IN PROGRESS  3.  Improve times sit to stand to </= 12 sec  Baseline:  Goal status: MET (11 sec)  4.  Patient reports ability to walk for 20 min without sensation of weakness or "leg giving away" Baseline:  Goal status: on going   5.  Independent in HEP including aquatic program  Baseline:  Goal status: on going    PLAN:  PT FREQUENCY: 2x/week  PT DURATION: 8 weeks  PLANNED INTERVENTIONS: Therapeutic exercises, Therapeutic activity, Neuromuscular re-education, Patient/Family education, Self Care, Joint mobilization, Aquatic Therapy, Dry Needling, Electrical stimulation, Spinal mobilization, Cryotherapy, Moist heat, Taping, Vasopneumatic device, Ultrasound, Ionotophoresis 4mg /ml Dexamethasone, Manual therapy, and Re-evaluation  PLAN FOR NEXT SESSION: Progress knee strengthening. Continue with postural correction and education; manual work, DN, modalities as indicated.  Kaylin Marcon Rober Minion, PT 11/23/2022, 5:00 PM

## 2022-11-27 ENCOUNTER — Encounter: Payer: Self-pay | Admitting: Rehabilitative and Restorative Service Providers"

## 2022-11-27 ENCOUNTER — Ambulatory Visit: Payer: No Typology Code available for payment source | Admitting: Rehabilitative and Restorative Service Providers"

## 2022-11-27 DIAGNOSIS — M25661 Stiffness of right knee, not elsewhere classified: Secondary | ICD-10-CM

## 2022-11-27 DIAGNOSIS — M6281 Muscle weakness (generalized): Secondary | ICD-10-CM | POA: Diagnosis not present

## 2022-11-27 DIAGNOSIS — R29898 Other symptoms and signs involving the musculoskeletal system: Secondary | ICD-10-CM

## 2022-11-27 NOTE — Therapy (Signed)
OUTPATIENT PHYSICAL THERAPY LE TREATMENT  Reporting Period 09/26/22 to 11/14/22  See note below for Objective Data and Assessment of Progress/Goals.     Patient Name: Kelli [name] MRN: [id number] DOB:1945/06/08, 77 y.o., female Today's Date: 11/27/2022  END OF SESSION:  PT End of Session - 11/27/22 1449     Visit Number 13    Number of Visits 28    Date for PT Re-Evaluation 01/04/23    Authorization Type [employer] 2024 copay $15    Progress Note Due on Visit 20    PT Start Time 1445    PT Stop Time 1530    PT Time Calculation (min) 45 min    Activity Tolerance Patient tolerated treatment well              Past Medical History:  Diagnosis Date   Anxiety    Arthritis    Borderline diabetic    Colon cancer (HCC)    colon ca dx 02/2009, lung cancer   Depression    Depression with anxiety 09/19/2011   Diabetes mellitus type 2, uncomplicated (HCC) 12/31/2014   Genetic testing 06/06/2022   H/O colon cancer, stage II 09/19/2011   T3N0  Cecum Sept 2010   High cholesterol    Hyperlipidemia 09/19/2011   Lung nodules 09/19/2011   granulomas   Osteoporosis 2022   Urinary urgency    Past Surgical History:  Procedure Laterality Date   APPENDECTOMY     AUGMENTATION MAMMAPLASTY     BREAST LUMPECTOMY WITH RADIOACTIVE SEED LOCALIZATION Right 03/22/2022   Procedure: RIGHT BREAST LUMPECTOMY WITH RADIOACTIVE SEED LOCALIZATION;  Surgeon: Almond Lint, MD;  Location: [facility];  Service: General;  Laterality: Right;   BREAST SURGERY  1978   augmentation   COLON SURGERY     2010   CRYO INTERCOSTAL NERVE BLOCK N/A 10/05/2014   Procedure: CRYO INTERCOSTAL NERVE BLOCK;  Surgeon: Loreli Slot, MD;  Location: [facility] OR;  Service: Thoracic;  Laterality: N/A;   EYE SURGERY     cataracts   LYMPH NODE DISSECTION Right 10/05/2014   Procedure: LYMPH NODE DISSECTION;  Surgeon: Loreli Slot, MD;  Location: [facility] OR;  Service: Thoracic;  Laterality: Right;    ORIF HUMERUS FRACTURE Left 10/01/2018   Procedure: OPEN REDUCTION INTERNAL FIXATION (ORIF) LEFT PROXIMAL HUMERUS FRACTURE;  Surgeon: Cammy Copa, MD;  Location: [facility] OR;  Service: Orthopedics;  Laterality: Left;   PARTIAL COLECTOMY Right 2010   SEGMENTECOMY Right 10/05/2014   Procedure: right lower lobe SEGMENTECTOMY;  Surgeon: Loreli Slot, MD;  Location: [facility] OR;  Service: Thoracic;  Laterality: Right;   VIDEO ASSISTED THORACOSCOPY (VATS)/WEDGE RESECTION Right 10/05/2014   Procedure: Right VIDEO ASSISTED THORACOSCOPY (VATS) with right lower lobe lung nodule WEDGE RESECTION;  Surgeon: Loreli Slot, MD;  Location: [facility] OR;  Service: Thoracic;  Laterality: Right;   Patient Active Problem List   Diagnosis Date Noted   Sebaceous cyst 06/20/2022   Genetic testing 06/06/2022   Malignant neoplasm of upper-outer quadrant of right breast in female, estrogen receptor positive (HCC) 03/06/2022   Avulsion fracture of left ankle 10/27/2021   Skin lesion 07/05/2021   Insomnia 03/31/2021   Osteoporosis 11/12/2020   Closed fracture of left proximal humerus 09/26/2018   Squamous cell carcinoma 03/05/2017   Echocardiogram shows left ventricular diastolic dysfunction 08/21/2016   Heart palpitations 07/25/2016   Need for vaccination for pneumococcus 07/13/2016   Osteoarthritis of hand, right 08/06/2015   Diabetes mellitus type 2,  uncomplicated (HCC) 12/31/2014   Lung cancer, lower lobe (HCC) 10/09/2014   Hematochezia 04/01/2013   H/O colon cancer, stage II 09/19/2011   Depression with anxiety 09/19/2011   Hyperlipidemia 09/19/2011    PCP: Dr Everrett Coombe  REFERRING PROVIDER: Dr Clementeen Graham   REFERRING DIAG: bilateral leg weakness   THERAPY DIAG:  Muscle weakness (generalized)  Other symptoms and signs involving the musculoskeletal system  Stiffness of both knees  Rationale for Evaluation and Treatment: Rehabilitation  ONSET DATE: 07/20/22  SUBJECTIVE:                                                                                                                                                                                       SUBJECTIVE STATEMENT: Patient reports pain in the Rt knee at times and she is accompanied by a sharp. She is having some burning in the thighs in the mornings. She still has the sensation of knees "giving away". Her knees have not buckled but feels it is "giving away"     Hand dominance: Right  PERTINENT HISTORY: Fx Lt shoulder with ORIF; chronic Rt shoulder pain; HTN; arthritis; osteoporosis; history of cancer colon, lung, breast; fx Lt foot; hx of LBP    PAIN:  Are you having pain? Yes: NPRS scale: 1/10 Pain location: R/L knee  Pain description: sharp R knee; lingering  Aggravating factors:  Relieving factors:   PRECAUTIONS:   WEIGHT BEARING RESTRICTIONS: No  FALLS:  Has patient fallen in last 6 months? No  LIVING ENVIRONMENT: Lives with: lives alone Lives in: House/apartment Stairs: level entry; one level  Has following equipment at home: None  OCCUPATION: Retired; Primary school teacher Activities: household chores; water exercise in summer; travel; dog sitting   PLOF: Independent  PATIENT GOALS: get stronger  NEXT MD VISIT: 01/15/23  OBJECTIVE:   DIAGNOSTIC FINDINGS:  None for knees    POSTURE: Patient presents with head forward posture with increased thoracic kyphosis; shoulders rounded and elevated; scapulae abducted and rotated along the thoracic spine; head of the humerus anterior in orientation; slightly flexed forward at hips   LOWER EXTREMITY ROM:     Active  Right eval Left eval Right 10/23/22 Left 10/23/22  Hip flexion      Hip extension      Hip abduction      Hip adduction      Hip internal rotation      Hip external rotation      Knee flexion 137 135    Knee extension -10 -4 -3 0  Ankle dorsiflexion 5 0    Ankle plantarflexion      Ankle inversion  Ankle eversion       (Blank rows  = not tested)   LOWER EXTREMITY MMT:    MMT Right eval Left eval Right  11/14/22 Left  11/14/22  Hip flexion 4+ 4+ 5- 5-  Hip extension 4- 4- 4 4  Hip abduction 4+ 4+ 4+ 4+  Hip adduction      Hip internal rotation      Hip external rotation      Knee flexion 5 5 5 5   Knee extension 5 5 5 5   Ankle dorsiflexion 4+ 4    Ankle plantarflexion 4 4    Ankle inversion      Ankle eversion       (Blank rows = not tested)   5 times sit to stand: 13.65 sec    [facility] Adult PT Treatment:                                                DATE: 11/27/2022 Therapeutic Exercise: Recumbent bike L3 ->L2 x 6.5 min Quad stretch prone with strap 30 sec x 3 (PT assist)  Hamstring stretch w/strap 30 sec x 3  Mini squat w/ball btn knees 5 sec x 10  Adductor squeeze with ball 5 sec x 10  Bridging w/ ball squeeze 5 sec x 10  SLR in ER 5 sec x 10 R/L Gastroc stretch 3x30" on slantboard  Leg press DL 62#  x 10 --> SL 95# M84 each --> wide DL 132# G40   [facility] Adult PT Treatment:                                                DATE: 11/23/2022 Therapeutic Exercise: Nustep L5 x 6 min Hamstring stretch L/R 30 sec x 3 ITB stretch 30 sec x 3 L/R Quad stretch 30 sec x 3  Wall squat 10 sec x 10  Mini squat w/ball btn knees 5 sec x 10  Adductor squeeze with ball 5 sec x 10  Bridging w/ ball squeeze 5 sec x 10  SLR in ER 5 sec x 10 R/L Gastroc stretch 3x30" on slantboard   [facility] Adult PT Treatment:   (dry needling)                                             DATE: 11/16/2022 Therapeutic Exercise: Nustep L6 x 6 min Mini squat w/ball btn knees 5 sec x 10  Adductor squeeze with ball 5 sec x 10  Bridging w/ ball squeeze 5 sec x 10  SLR in ER 5 sec x 10 R/L Gastroc stretch 3x30" on slantboard  Manual: Skilled palpation to assess response to manual work and DN Trigger Point Dry-Needling  Treatment instructions: Expect mild to moderate muscle soreness. S/S of pneumothorax if dry needled over a lung field, and to  seek immediate medical attention should they occur. Patient verbalized understanding of these instructions and education.  Patient Consent Given: Yes Education handout provided: Yes Muscles treated: R/L quad  Electrical stimulation performed: Yes Parameters: mAmp current intensity to tolerance  Treatment response/outcome: decreased palpable tightness    IASTM  bilat lateral quads   [facility] Adult PT Treatment:                                                DATE: 11/09/2022 Therapeutic Exercise: NuStep L7 x 5 min Gastroc stretch 3x30" on slantboard  SLR 4#AW: parallel & ER 12x5" (B) Standing HS curls 4#AW x15 (B) Leg press DL 09#  W11 --> SL 91# Y78 each --> wide DL 295# A21    PATIENT EDUCATION: Education details: Updated HEP  Person educated: Patient Education method: Explanation, Demonstration, Tactile cues, Verbal cues, and Handouts Education comprehension: verbalized understanding, returned demonstration, verbal cues required, tactile cues required, and needs further education  HOME EXERCISE PROGRAM: Access Code: [email or web address] URL: [email or web address] Date: 11/23/2022 Prepared by: Corlis Leak  Exercises - Seated Hamstring Stretch  - 2 x daily - 7 x weekly - 1 sets - 3 reps - 30 sec  hold - Sit to Stand  - 2 x daily - 7 x weekly - 1-2 sets - 10 reps - 3-5 sec  hold - Gastroc Stretch on Wall  - 2 x daily - 7 x weekly - 1 sets - 3 reps - 30 sec  hold - Soleus Stretch on Wall  - 2 x daily - 7 x weekly - 1 sets - 3 reps - 30 sec  hold - Clam with Resistance  - 1 x daily - 7 x weekly - 3 sets - 10 reps - Seated Knee Extension with Resistance  - 1 x daily - 7 x weekly - 3 sets - 10 reps - Hooklying Hamstring Stretch with Strap  - 2 x daily - 7 x weekly - 1 sets - 3 reps - 30 sec  hold - Supine ITB Stretch with Strap  - 2 x daily - 7 x weekly - 1 sets - 3 reps - 30 sec  hold - Prone Quadriceps Stretch with Strap  - 2 x daily - 7 x weekly - 1 sets - 3 reps - 30 sec  hold -  Seated Hip Flexor Stretch  - 2 x daily - 7 x weekly - 1 sets - 3 reps - 30 sec  hold Education:  - Kinesptaping handout   ASSESSMENT:  CLINICAL IMPRESSION:  Increased tightness in quads bilat with persistent pain in the R knee. Note increased tightness in the quads; HS; ITB. continued stretching and strengthening for LE's. Patient preformed sit to stand with greater ease following stretching. Sensation of "giving away" persists. She will focus on stretching quads a couple of times a day with one of those times being just before bed. She will monitor to see if stretching at night decreases the burning in the quad in the morning.    OBJECTIVE IMPAIRMENTS: decreased activity tolerance, decreased mobility, decreased ROM, decreased strength, increased fascial restrictions, increased muscle spasms, impaired flexibility, impaired UE functional use, improper body mechanics, postural dysfunction, and pain.    GOALS: Goals reviewed with patient? Yes  SHORT TERM GOALS: Target date: 10/24/2022  Independent in initial HEP  Goal status: MET  2.  Increase Rt knee extension to (-) 5 and Lt knee extension to 0 degrees  Baseline:  Goal status: MET   LONG TERM GOALS: Target date: 01/04/2023  Increase bilat LE strength to 5-/5 to 5/5  Baseline:  Goal status: on going   2.  Increased  Rt knee extension to (-) 2 to 0 degrees  Baseline:  Goal status: IN PROGRESS  3.  Improve times sit to stand to </= 12 sec  Baseline:  Goal status: MET (11 sec)  4.  Patient reports ability to walk for 20 min without sensation of weakness or "leg giving away" Baseline:  Goal status: on going   5.  Independent in HEP including aquatic program  Baseline:  Goal status: on going    PLAN:  PT FREQUENCY: 2x/week  PT DURATION: 8 weeks  PLANNED INTERVENTIONS: Therapeutic exercises, Therapeutic activity, Neuromuscular re-education, Patient/Family education, Self Care, Joint mobilization, Aquatic Therapy, Dry  Needling, Electrical stimulation, Spinal mobilization, Cryotherapy, Moist heat, Taping, Vasopneumatic device, Ultrasound, Ionotophoresis 4mg /ml Dexamethasone, Manual therapy, and Re-evaluation  PLAN FOR NEXT SESSION: Progress knee strengthening. Continue with postural correction and education; manual work, DN, modalities as indicated.    Val Riles, PT 11/27/2022, 2:51 PM

## 2022-11-30 ENCOUNTER — Encounter: Payer: Self-pay | Admitting: Rehabilitative and Restorative Service Providers"

## 2022-11-30 ENCOUNTER — Ambulatory Visit: Payer: No Typology Code available for payment source | Admitting: Rehabilitative and Restorative Service Providers"

## 2022-11-30 DIAGNOSIS — M6281 Muscle weakness (generalized): Secondary | ICD-10-CM | POA: Diagnosis not present

## 2022-11-30 DIAGNOSIS — M25661 Stiffness of right knee, not elsewhere classified: Secondary | ICD-10-CM

## 2022-11-30 DIAGNOSIS — R29898 Other symptoms and signs involving the musculoskeletal system: Secondary | ICD-10-CM

## 2022-11-30 NOTE — Therapy (Signed)
OUTPATIENT PHYSICAL THERAPY LE TREATMENT  Reporting Period 09/26/22 to 11/14/22  See note below for Objective Data and Assessment of Progress/Goals.     Patient Name: Kelli [name] MRN: [id number] DOB:April 20, 1946, 77 y.o., female Today's Date: 11/30/2022  END OF SESSION:  PT End of Session - 11/30/22 1449     Visit Number 14    Number of Visits 28    Date for PT Re-Evaluation 01/04/23    Authorization Type [employer] 2024 copay $[id number]    Progress Note Due on Visit 20    PT Start Time 1448    PT Stop Time 1534    PT Time Calculation (min) 46 min    Activity Tolerance Patient tolerated treatment well              Past Medical History:  Diagnosis Date   Anxiety    Arthritis    Borderline diabetic    Colon cancer (HCC)    colon ca dx 02/2009, lung cancer   Depression    Depression with anxiety 09/19/2011   Diabetes mellitus type 2, uncomplicated (HCC) 12/31/2014   Genetic testing 06/06/2022   H/O colon cancer, stage II 09/19/2011   T3N0  Cecum Sept 2010   High cholesterol    Hyperlipidemia 09/19/2011   Lung nodules 09/19/2011   granulomas   Osteoporosis 2022   Urinary urgency    Past Surgical History:  Procedure Laterality Date   APPENDECTOMY     AUGMENTATION MAMMAPLASTY     BREAST LUMPECTOMY WITH RADIOACTIVE SEED LOCALIZATION Right 03/22/2022   Procedure: RIGHT BREAST LUMPECTOMY WITH RADIOACTIVE SEED LOCALIZATION;  Surgeon: Almond Lint, MD;  Location: [facility];  Service: General;  Laterality: Right;   BREAST SURGERY  1978   augmentation   COLON SURGERY     2010   CRYO INTERCOSTAL NERVE BLOCK N/A 10/05/2014   Procedure: CRYO INTERCOSTAL NERVE BLOCK;  Surgeon: Loreli Slot, MD;  Location: [facility] OR;  Service: Thoracic;  Laterality: N/A;   EYE SURGERY     cataracts   LYMPH NODE DISSECTION Right 10/05/2014   Procedure: LYMPH NODE DISSECTION;  Surgeon: Loreli Slot, MD;  Location: [facility] OR;  Service: Thoracic;  Laterality: Right;    ORIF HUMERUS FRACTURE Left 10/01/2018   Procedure: OPEN REDUCTION INTERNAL FIXATION (ORIF) LEFT PROXIMAL HUMERUS FRACTURE;  Surgeon: Cammy Copa, MD;  Location: [facility] OR;  Service: Orthopedics;  Laterality: Left;   PARTIAL COLECTOMY Right 2010   SEGMENTECOMY Right 10/05/2014   Procedure: right lower lobe SEGMENTECTOMY;  Surgeon: Loreli Slot, MD;  Location: [facility] OR;  Service: Thoracic;  Laterality: Right;   VIDEO ASSISTED THORACOSCOPY (VATS)/WEDGE RESECTION Right 10/05/2014   Procedure: Right VIDEO ASSISTED THORACOSCOPY (VATS) with right lower lobe lung nodule WEDGE RESECTION;  Surgeon: Loreli Slot, MD;  Location: [facility] OR;  Service: Thoracic;  Laterality: Right;   Patient Active Problem List   Diagnosis Date Noted   Sebaceous cyst 06/20/2022   Genetic testing 06/06/2022   Malignant neoplasm of upper-outer quadrant of right breast in female, estrogen receptor positive (HCC) 03/06/2022   Avulsion fracture of left ankle 10/27/2021   Skin lesion 07/05/2021   Insomnia 03/31/2021   Osteoporosis 11/12/2020   Closed fracture of left proximal humerus 09/26/2018   Squamous cell carcinoma 03/05/2017   Echocardiogram shows left ventricular diastolic dysfunction 08/21/2016   Heart palpitations 07/25/2016   Need for vaccination for pneumococcus 07/13/2016   Osteoarthritis of hand, right 08/06/2015   Diabetes mellitus type 2,  uncomplicated (HCC) 12/31/2014   Lung cancer, lower lobe (HCC) 10/09/2014   Hematochezia 04/01/2013   H/O colon cancer, stage II 09/19/2011   Depression with anxiety 09/19/2011   Hyperlipidemia 09/19/2011    PCP: Dr Everrett Coombe  REFERRING PROVIDER: Dr Clementeen Graham   REFERRING DIAG: bilateral leg weakness   THERAPY DIAG:  Muscle weakness (generalized)  Other symptoms and signs involving the musculoskeletal system  Stiffness of both knees  Rationale for Evaluation and Treatment: Rehabilitation  ONSET DATE: 07/20/22  SUBJECTIVE:                                                                                                                                                                                       SUBJECTIVE STATEMENT: Patient reports some pain in the both knees today but she has not had the burning sensation in the mornings since she has been stretching at night. She is working out in [employer]. She still has the sensation of knees "giving away" but she hsa not been doing as much walking so she is not sure the sensation of "giving away" is still there. Her knees have not buckled.     Hand dominance: Right  PERTINENT HISTORY: Fx Lt shoulder with ORIF; chronic Rt shoulder pain; HTN; arthritis; osteoporosis; history of cancer colon, lung, breast; fx Lt foot; hx of LBP    PAIN:  Are you having pain? Yes: NPRS scale: 3/10 Pain location: R/L knee  Pain description: sharp R knee; lingering  Aggravating factors:  Relieving factors:  . PRECAUTIONS:   WEIGHT BEARING RESTRICTIONS: No  FALLS:  Has patient fallen in last 6 months? No  LIVING ENVIRONMENT: Lives with: lives alone Lives in: House/apartment Stairs: level entry; one level  Has following equipment at home: None  OCCUPATION: Retired; Primary school teacher Activities: household chores; water exercise in summer; travel; dog sitting   PLOF: Independent  PATIENT GOALS: get stronger  NEXT MD VISIT: 01/15/23  OBJECTIVE:   DIAGNOSTIC FINDINGS:  None for knees    POSTURE: Patient presents with head forward posture with increased thoracic kyphosis; shoulders rounded and elevated; scapulae abducted and rotated along the thoracic spine; head of the humerus anterior in orientation; slightly flexed forward at hips   LOWER EXTREMITY ROM:     Active  Right eval Left eval Right 10/23/22 Left 10/23/22  Hip flexion      Hip extension      Hip abduction      Hip adduction      Hip internal rotation      Hip external rotation      Knee flexion 137 135    Knee extension  -10 -  4 -3 0  Ankle dorsiflexion 5 0    Ankle plantarflexion      Ankle inversion      Ankle eversion       (Blank rows = not tested)   LOWER EXTREMITY MMT:    MMT Right eval Left eval Right  11/14/22 Left  11/14/22  Hip flexion 4+ 4+ 5- 5-  Hip extension 4- 4- 4 4  Hip abduction 4+ 4+ 4+ 4+  Hip adduction      Hip internal rotation      Hip external rotation      Knee flexion 5 5 5 5   Knee extension 5 5 5 5   Ankle dorsiflexion 4+ 4    Ankle plantarflexion 4 4    Ankle inversion      Ankle eversion       (Blank rows = not tested)   5 times sit to stand: 13.65 sec   [facility] Adult PT Treatment:   (dry needling)                                             DATE: 11/30/2022 Therapeutic Exercise: Nustep L6 x 6 min Mini squat w/ball btn knees 5 sec x 10  Adductor squeeze with ball 5 sec x 10  Bridging w/ ball squeeze 5 sec x 10  SLR in ER 5 sec x 10 R/L Gastroc stretch 3x30" on slantboard  Manual: Skilled palpation to assess response to manual work and DN Trigger Point Dry-Needling  Treatment instructions: Expect mild to moderate muscle soreness. S/S of pneumothorax if dry needled over a lung field, and to seek immediate medical attention should they occur. Patient verbalized understanding of these instructions and education.  Patient Consent Given: Yes Education handout provided: Yes Muscles treated: R/L quad  Electrical stimulation performed: Yes Parameters: mAmp current intensity to tolerance  Treatment response/outcome: decreased palpable tightness    IASTM bilat lateral quads    [facility] Adult PT Treatment:                                                DATE: 11/27/2022 Therapeutic Exercise: Recumbent bike L3 ->L2 x 6.5 min Quad stretch prone with strap 30 sec x 3 (PT assist)  Hamstring stretch w/strap 30 sec x 3  Mini squat w/ball btn knees 5 sec x 10  Adductor squeeze with ball 5 sec x 10  Bridging w/ ball squeeze 5 sec x 10  SLR in ER 5 sec x 10 R/L Gastroc stretch  3x30" on slantboard  Leg press DL 29#  x 10 --> SL 56# O13 each --> wide DL 086# V78   [facility] Adult PT Treatment:                                                DATE: 11/23/2022 Therapeutic Exercise: Nustep L5 x 6 min Hamstring stretch L/R 30 sec x 3 ITB stretch 30 sec x 3 L/R Quad stretch 30 sec x 3  Wall squat 10 sec x 10  Mini squat w/ball btn knees 5 sec x 10  Adductor squeeze with ball  5 sec x 10  Bridging w/ ball squeeze 5 sec x 10  SLR in ER 5 sec x 10 R/L Gastroc stretch 3x30" on slantboard   [facility] Adult PT Treatment:   (dry needling)                                             DATE: 11/16/2022 Therapeutic Exercise: Nustep L6 x 6 min Mini squat w/ball btn knees 5 sec x 10  Adductor squeeze with ball 5 sec x 10  Bridging w/ ball squeeze 5 sec x 10  SLR in ER 5 sec x 10 R/L Gastroc stretch 3x30" on slantboard  Manual: Skilled palpation to assess response to manual work and DN Trigger Point Dry-Needling  Treatment instructions: Expect mild to moderate muscle soreness. S/S of pneumothorax if dry needled over a lung field, and to seek immediate medical attention should they occur. Patient verbalized understanding of these instructions and education.  Patient Consent Given: Yes Education handout provided: Yes Muscles treated: R/L quad  Electrical stimulation performed: Yes Parameters: mAmp current intensity to tolerance  Treatment response/outcome: decreased palpable tightness    IASTM bilat lateral quads    PATIENT EDUCATION: Education details: Updated HEP  Person educated: Patient Education method: Explanation, Demonstration, Tactile cues, Verbal cues, and Handouts Education comprehension: verbalized understanding, returned demonstration, verbal cues required, tactile cues required, and needs further education  HOME EXERCISE PROGRAM: Access Code: [email or web address] URL: [email or web address] Date: 11/23/2022 Prepared by: Corlis Leak  Exercises - Seated Hamstring  Stretch  - 2 x daily - 7 x weekly - 1 sets - 3 reps - 30 sec  hold - Sit to Stand  - 2 x daily - 7 x weekly - 1-2 sets - 10 reps - 3-5 sec  hold - Gastroc Stretch on Wall  - 2 x daily - 7 x weekly - 1 sets - 3 reps - 30 sec  hold - Soleus Stretch on Wall  - 2 x daily - 7 x weekly - 1 sets - 3 reps - 30 sec  hold - Clam with Resistance  - 1 x daily - 7 x weekly - 3 sets - 10 reps - Seated Knee Extension with Resistance  - 1 x daily - 7 x weekly - 3 sets - 10 reps - Hooklying Hamstring Stretch with Strap  - 2 x daily - 7 x weekly - 1 sets - 3 reps - 30 sec  hold - Supine ITB Stretch with Strap  - 2 x daily - 7 x weekly - 1 sets - 3 reps - 30 sec  hold - Prone Quadriceps Stretch with Strap  - 2 x daily - 7 x weekly - 1 sets - 3 reps - 30 sec  hold - Seated Hip Flexor Stretch  - 2 x daily - 7 x weekly - 1 sets - 3 reps - 30 sec  hold Education:  - Kinesptaping handout   ASSESSMENT:  CLINICAL IMPRESSION:  Increased tightness in quads bilat with persistent pain in the R knee. Note increased tightness in the quads; HS; ITB. continued stretching and strengthening for LE's. Patient preformed sit to stand with greater ease following stretching. Sensation of "giving away" persists. She has focused on stretching quads a couple of times a day with one of those times being just before bed with resolution of burning in quads in the morning.  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 Patient to continue with HEP   OBJECTIVE IMPAIRMENTS: decreased activity tolerance, decreased mobility, decreased ROM, decreased strength, increased fascial restrictions, increased muscle spasms, impaired flexibility, impaired UE functional use, improper body mechanics, postural dysfunction, and pain.    GOALS: Goals reviewed with patient? Yes  SHORT TERM GOALS: Target date: 10/24/2022  Independent in initial HEP  Goal status: MET  2.  Increase Rt knee extension to (-) 5 and Lt knee extension to 0 degrees  Baseline:  Goal  status: MET   LONG TERM GOALS: Target date: 01/04/2023  Increase bilat LE strength to 5-/5 to 5/5  Baseline:  Goal status: on going   2.  Increased Rt knee extension to (-) 2 to 0 degrees  Baseline:  Goal status: IN PROGRESS  3.  Improve times sit to stand to </= 12 sec  Baseline:  Goal status: MET (11 sec)  4.  Patient reports ability to walk for 20 min without sensation of weakness or "leg giving away" Baseline:  Goal status: on going   5.  Independent in HEP including aquatic program  Baseline:  Goal status: on going    PLAN:  PT FREQUENCY: 2x/week  PT DURATION: 8 weeks  PLANNED INTERVENTIONS: Therapeutic exercises, Therapeutic activity, Neuromuscular re-education, Patient/Family education, Self Care, Joint mobilization, Aquatic Therapy, Dry Needling, Electrical stimulation, Spinal mobilization, Cryotherapy, Moist heat, Taping, Vasopneumatic device, Ultrasound, Ionotophoresis 4mg /ml Dexamethasone, Manual therapy, and Re-evaluation  PLAN FOR NEXT SESSION: Progress knee strengthening. Continue with postural correction and education; manual work, DN, modalities as indicated.    Val Riles, PT 11/30/2022, 2:56 PM

## 2022-12-05 ENCOUNTER — Encounter: Payer: Self-pay | Admitting: Rehabilitative and Restorative Service Providers"

## 2022-12-05 ENCOUNTER — Ambulatory Visit
Payer: No Typology Code available for payment source | Attending: Family Medicine | Admitting: Rehabilitative and Restorative Service Providers"

## 2022-12-05 DIAGNOSIS — R29898 Other symptoms and signs involving the musculoskeletal system: Secondary | ICD-10-CM | POA: Insufficient documentation

## 2022-12-05 DIAGNOSIS — M6281 Muscle weakness (generalized): Secondary | ICD-10-CM | POA: Diagnosis not present

## 2022-12-05 DIAGNOSIS — M25662 Stiffness of left knee, not elsewhere classified: Secondary | ICD-10-CM | POA: Insufficient documentation

## 2022-12-05 DIAGNOSIS — M25661 Stiffness of right knee, not elsewhere classified: Secondary | ICD-10-CM | POA: Diagnosis not present

## 2022-12-05 NOTE — Therapy (Addendum)
 OUTPATIENT PHYSICAL THERAPY LE TREATMENT PHYSICAL THERAPY DISCHARGE SUMMARY  Visits from Start of Care: 15  Current functional level related to goals / functional outcomes: See progress note for discharge status    Remaining deficits: Continued LE weakness    Education / Equipment: HEP    Patient agrees to discharge. Patient goals were partially met. Patient is being discharged due to not returning since the last visit.  Camilia Caywood P. Leonor Liv PT, MPH 08/13/23 10:37 AM    Reporting Period 09/26/22 to 11/14/22  See note below for Objective Data and Assessment of Progress/Goals.     Patient Name: ROSIBEL GIACOBBE MRN: [id number] DOB:[birth year], 77 y.o., female Today's Date: 12/05/2022  END OF SESSION:  PT End of Session - 12/05/22 1317     Visit Number 15    Number of Visits 28    Date for PT Re-Evaluation 01/04/23    Authorization Type [employer] 2024 copay $15    Progress Note Due on Visit 20    PT Start Time 1315    PT Stop Time 1402    PT Time Calculation (min) 47 min    Activity Tolerance Patient tolerated treatment well              Past Medical History:  Diagnosis Date   Anxiety    Arthritis    Borderline diabetic    Colon cancer (HCC)    colon ca dx 02/2009, lung cancer   Depression    Depression with anxiety 09/19/2011   Diabetes mellitus type 2, uncomplicated (HCC) 12/31/2014   Genetic testing 06/06/2022   H/O colon cancer, stage II 09/19/2011   T3N0  Cecum Sept 2010   High cholesterol    Hyperlipidemia 09/19/2011   Lung nodules 09/19/2011   granulomas   Osteoporosis 2022   Urinary urgency    Past Surgical History:  Procedure Laterality Date   APPENDECTOMY     AUGMENTATION MAMMAPLASTY     BREAST LUMPECTOMY WITH RADIOACTIVE SEED LOCALIZATION Right 03/22/2022   Procedure: RIGHT BREAST LUMPECTOMY WITH RADIOACTIVE SEED LOCALIZATION;  Surgeon: Almond Lint, MD;  Location: [facility];  Service: General;  Laterality: Right;   BREAST  SURGERY  1978   augmentation   COLON SURGERY     2010   CRYO INTERCOSTAL NERVE BLOCK N/A 10/05/2014   Procedure: CRYO INTERCOSTAL NERVE BLOCK;  Surgeon: Loreli Slot, MD;  Location: [facility] OR;  Service: Thoracic;  Laterality: N/A;   EYE SURGERY     cataracts   LYMPH NODE DISSECTION Right 10/05/2014   Procedure: LYMPH NODE DISSECTION;  Surgeon: Loreli Slot, MD;  Location: [facility] OR;  Service: Thoracic;  Laterality: Right;   ORIF HUMERUS FRACTURE Left 10/01/2018   Procedure: OPEN REDUCTION INTERNAL FIXATION (ORIF) LEFT PROXIMAL HUMERUS FRACTURE;  Surgeon: Cammy Copa, MD;  Location: [facility] OR;  Service: Orthopedics;  Laterality: Left;   PARTIAL COLECTOMY Right 2010   SEGMENTECOMY Right 10/05/2014   Procedure: right lower lobe SEGMENTECTOMY;  Surgeon: Loreli Slot, MD;  Location: [facility] OR;  Service: Thoracic;  Laterality: Right;   VIDEO ASSISTED THORACOSCOPY (VATS)/WEDGE RESECTION Right 10/05/2014   Procedure: Right VIDEO ASSISTED THORACOSCOPY (VATS) with right lower lobe lung nodule WEDGE RESECTION;  Surgeon: Loreli Slot, MD;  Location: [facility] OR;  Service: Thoracic;  Laterality: Right;   Patient Active Problem List   Diagnosis Date Noted   Sebaceous cyst 06/20/2022   Genetic testing 06/06/2022   Malignant neoplasm of upper-outer quadrant of right breast in  female, estrogen receptor positive (HCC) 03/06/2022   Avulsion fracture of left ankle 10/27/2021   Skin lesion 07/05/2021   Insomnia 03/31/2021   Osteoporosis 11/12/2020   Closed fracture of left proximal humerus 09/26/2018   Squamous cell carcinoma 03/05/2017   Echocardiogram shows left ventricular diastolic dysfunction 08/21/2016   Heart palpitations 07/25/2016   Need for vaccination for pneumococcus 07/13/2016   Osteoarthritis of hand, right 08/06/2015   Diabetes mellitus type 2, uncomplicated (HCC) 12/31/2014   Lung cancer, lower lobe (HCC) 10/09/2014   Hematochezia 04/01/2013   H/O colon cancer, stage  II 09/19/2011   Depression with anxiety 09/19/2011   Hyperlipidemia 09/19/2011    PCP: Dr Everrett Coombe  REFERRING PROVIDER: Dr Clementeen Graham   REFERRING DIAG: bilateral leg weakness   THERAPY DIAG:  Muscle weakness (generalized)  Other symptoms and signs involving the musculoskeletal system  Stiffness of both knees  Rationale for Evaluation and Treatment: Rehabilitation  ONSET DATE: 07/20/22  SUBJECTIVE:                                                                                                                                                                                      SUBJECTIVE STATEMENT: Patient reports some pain in the both knees continues. She is having tingling in both thighs in the morning but not every morning. Now having some tingling all over her body. She is working out in [employer]. She still has the sensation of knees "giving away" and now she has a sensation of pain. Her knees have not buckled.     Hand dominance: Right  PERTINENT HISTORY: Fx Lt shoulder with ORIF; chronic Rt shoulder pain; HTN; arthritis; osteoporosis; history of cancer colon, lung, breast; fx Lt foot; hx of LBP    PAIN:  Are you having pain? Yes: NPRS scale: 3/10 Pain location: R/L knee  Pain description: sharp R knee; lingering  Aggravating factors:  Relieving factors:  . PRECAUTIONS:   WEIGHT BEARING RESTRICTIONS: No  FALLS:  Has patient fallen in last 6 months? No  LIVING ENVIRONMENT: Lives with: lives alone Lives in: House/apartment Stairs: level entry; one level  Has following equipment at home: None  OCCUPATION: Retired; Primary school teacher Activities: household chores; water exercise in summer; travel; dog sitting   PATIENT GOALS: get stronger  NEXT MD VISIT: 01/15/23  OBJECTIVE:   DIAGNOSTIC FINDINGS:  None for knees    POSTURE: Patient presents with head forward posture with increased thoracic kyphosis; shoulders rounded and elevated; scapulae abducted  and rotated along the thoracic spine; head of the humerus anterior in orientation; slightly flexed forward at hips   LOWER EXTREMITY ROM:     Active  Right eval Left eval Right 10/23/22 Left 10/23/22 Right  12/05/22 Left  12/05/22  Hip flexion        Hip extension        Hip abduction        Hip adduction        Hip internal rotation        Hip external rotation        Knee flexion 137 135      Knee extension -10 -4 -3 0 -3 0  Ankle dorsiflexion 5 0      Ankle plantarflexion        Ankle inversion        Ankle eversion         (Blank rows = not tested)   LOWER EXTREMITY MMT:    MMT Right eval Left eval Right  11/14/22 Left  11/14/22 Right  12/05/22 Left 12/05/22  Hip flexion 4+ 4+ 5- 5- 5 5  Hip extension 4- 4- 4 4 5 5   Hip abduction 4+ 4+ 4+ 4+ 5- 5-  Hip adduction        Hip internal rotation        Hip external rotation        Knee flexion 5 5 5 5 5 5   Knee extension 5 5 5 5 5 5   Ankle dorsiflexion 4+ 4      Ankle plantarflexion 4 4      Ankle inversion        Ankle eversion         (Blank rows = not tested)   5 times sit to stand: 13.65 sec   12/05/22 ; 11.16 sec   [facility] Adult PT Treatment:                                                DATE: 12/05/2022 Therapeutic Exercise: Nustep L5 x 5 min Quad stretch prone with strap 30 sec x 3 (PT assist)  Hamstring stretch w/strap 30 sec x 3 Prone quad set toe on table 3 sec x 10  Sit to stand x 5; repeated slowly x 5  Mini squat w/ball btn knees 10 sec x 10  Adductor squeeze with ball 5 sec x 10  Bridging w/ ball squeeze 5 sec x 10  SLR in ER 5 sec x 10 R/L Gastroc stretch 3x30" on slantboard  Leg press DL 82#  x 10 --> SL 95# A21 each --> wide DL 308# M57  [facility] Adult PT Treatment:   (dry needling)                                             DATE: 11/30/2022 Therapeutic Exercise: Nustep L6 x 6 min Mini squat w/ball btn knees 5 sec x 10  Adductor squeeze with ball 5 sec x 10  Bridging w/ ball squeeze 5 sec x 10  SLR in  ER 5 sec x 10 R/L Gastroc stretch 3x30" on slantboard  Manual: Skilled palpation to assess response to manual work and DN Trigger Point Dry-Needling  Treatment instructions: Expect mild to moderate muscle soreness. S/S of pneumothorax if dry needled over a lung field, and to seek immediate medical attention should they occur. Patient verbalized understanding of these  instructions and education.  Patient Consent Given: Yes Education handout provided: Yes Muscles treated: R/L quad  Electrical stimulation performed: Yes Parameters: mAmp current intensity to tolerance  Treatment response/outcome: decreased palpable tightness    IASTM bilat lateral quads    PATIENT EDUCATION: Education details: Updated HEP  Person educated: Patient Education method: Explanation, Demonstration, Tactile cues, Verbal cues, and Handouts Education comprehension: verbalized understanding, returned demonstration, verbal cues required, tactile cues required, and needs further education  HOME EXERCISE PROGRAM: Access Code: [email or web address] URL: [email or web address] Date: 11/23/2022 Prepared by: Corlis Leak  Exercises - Seated Hamstring Stretch  - 2 x daily - 7 x weekly - 1 sets - 3 reps - 30 sec  hold - Sit to Stand  - 2 x daily - 7 x weekly - 1-2 sets - 10 reps - 3-5 sec  hold - Gastroc Stretch on Wall  - 2 x daily - 7 x weekly - 1 sets - 3 reps - 30 sec  hold - Soleus Stretch on Wall  - 2 x daily - 7 x weekly - 1 sets - 3 reps - 30 sec  hold - Clam with Resistance  - 1 x daily - 7 x weekly - 3 sets - 10 reps - Seated Knee Extension with Resistance  - 1 x daily - 7 x weekly - 3 sets - 10 reps - Hooklying Hamstring Stretch with Strap  - 2 x daily - 7 x weekly - 1 sets - 3 reps - 30 sec  hold - Supine ITB Stretch with Strap  - 2 x daily - 7 x weekly - 1 sets - 3 reps - 30 sec  hold - Prone Quadriceps Stretch with Strap  - 2 x daily - 7 x weekly - 1 sets - 3 reps - 30 sec  hold - Seated Hip Flexor  Stretch  - 2 x daily - 7 x weekly - 1 sets - 3 reps - 30 sec  hold Education:  - Kinesptaping handout   ASSESSMENT:  CLINICAL IMPRESSION:  Continued intermittent tightness and burning in the in quads bilat with some persistent pain in the R knee. Continued tightness in the quads; HS; ITB. Patient is working on stretching and strengthening for LE's. Patient preformed 5 times sit to stand with decreased time in sit to stand ~ 2 seconds. Sensation of "giving away" persists. She has focused on stretching quads several times a day with one of those times being just before bed with partial resolution of burning in quads in the morning. Patient to continue with HEP including exercise in the pool at home. Goals of therapy have been accomplished. Patient will schedule follow up with Dr Denyse Amass.    OBJECTIVE IMPAIRMENTS: decreased activity tolerance, decreased mobility, decreased ROM, decreased strength, increased fascial restrictions, increased muscle spasms, impaired flexibility, impaired UE functional use, improper body mechanics, postural dysfunction, and pain.    GOALS: Goals reviewed with patient? Yes  SHORT TERM GOALS: Target date: 10/24/2022  Independent in initial HEP  Goal status: MET  2.  Increase Rt knee extension to (-) 5 and Lt knee extension to 0 degrees  Baseline:  Goal status: MET   LONG TERM GOALS: Target date: 01/04/2023  Increase bilat LE strength to 5-/5 to 5/5  Baseline:  Goal status: met   2.  Increased Rt knee extension to (-) 2 to 0 degrees  Baseline:  Goal status: partially accomplished   3.  Improve times sit to stand to </= 12 sec  Baseline:  Goal status: MET (11 sec)  4.  Patient reports ability to walk for 20 min without sensation of weakness or "leg giving away" Baseline:  Goal status: on going   5.  Independent in HEP including aquatic program  Baseline:  Goal status: met     PLAN:  PT FREQUENCY: 2x/week  PT DURATION: 8 weeks  PLANNED  INTERVENTIONS: Therapeutic exercises, Therapeutic activity, Neuromuscular re-education, Patient/Family education, Self Care, Joint mobilization, Aquatic Therapy, Dry Needling, Electrical stimulation, Spinal mobilization, Cryotherapy, Moist heat, Taping, Vasopneumatic device, Ultrasound, Ionotophoresis 4mg /ml Dexamethasone, Manual therapy, and Re-evaluation  PLAN FOR NEXT SESSION: Progress knee strengthening. Continue with postural correction and education; manual work, DN, modalities as indicated.    Anjelika Ausburn Rober Minion, PT 12/05/2022, 1:17 PM

## 2022-12-06 ENCOUNTER — Ambulatory Visit: Payer: No Typology Code available for payment source

## 2022-12-06 DIAGNOSIS — Z780 Asymptomatic menopausal state: Secondary | ICD-10-CM

## 2022-12-06 DIAGNOSIS — Z0000 Encounter for general adult medical examination without abnormal findings: Secondary | ICD-10-CM

## 2022-12-06 DIAGNOSIS — M810 Age-related osteoporosis without current pathological fracture: Secondary | ICD-10-CM | POA: Diagnosis not present

## 2022-12-06 DIAGNOSIS — M8589 Other specified disorders of bone density and structure, multiple sites: Secondary | ICD-10-CM | POA: Diagnosis not present

## 2022-12-09 ENCOUNTER — Other Ambulatory Visit: Payer: Self-pay | Admitting: Family Medicine

## 2022-12-09 DIAGNOSIS — M19041 Primary osteoarthritis, right hand: Secondary | ICD-10-CM

## 2022-12-11 ENCOUNTER — Encounter: Payer: Self-pay | Admitting: Rehabilitative and Restorative Service Providers"

## 2022-12-12 NOTE — Telephone Encounter (Signed)
Pt called. Following up on refill request for Clonidine and Gabapentin.

## 2022-12-14 ENCOUNTER — Telehealth: Payer: Self-pay | Admitting: Family Medicine

## 2022-12-14 ENCOUNTER — Encounter: Payer: Self-pay | Admitting: Rehabilitative and Restorative Service Providers"

## 2022-12-14 ENCOUNTER — Ambulatory Visit: Payer: No Typology Code available for payment source

## 2022-12-14 ENCOUNTER — Encounter: Payer: Self-pay | Admitting: Family Medicine

## 2022-12-14 ENCOUNTER — Other Ambulatory Visit: Payer: Self-pay

## 2022-12-14 ENCOUNTER — Ambulatory Visit: Payer: No Typology Code available for payment source | Admitting: Family Medicine

## 2022-12-14 VITALS — BP 110/72 | HR 66 | Ht 68.0 in | Wt 180.0 lb

## 2022-12-14 DIAGNOSIS — M791 Myalgia, unspecified site: Secondary | ICD-10-CM

## 2022-12-14 DIAGNOSIS — R29898 Other symptoms and signs involving the musculoskeletal system: Secondary | ICD-10-CM | POA: Diagnosis not present

## 2022-12-14 DIAGNOSIS — G8929 Other chronic pain: Secondary | ICD-10-CM

## 2022-12-14 DIAGNOSIS — M25562 Pain in left knee: Secondary | ICD-10-CM

## 2022-12-14 DIAGNOSIS — M25561 Pain in right knee: Secondary | ICD-10-CM

## 2022-12-14 DIAGNOSIS — M1711 Unilateral primary osteoarthritis, right knee: Secondary | ICD-10-CM | POA: Diagnosis not present

## 2022-12-14 DIAGNOSIS — M25462 Effusion, left knee: Secondary | ICD-10-CM | POA: Diagnosis not present

## 2022-12-14 DIAGNOSIS — M1712 Unilateral primary osteoarthritis, left knee: Secondary | ICD-10-CM | POA: Diagnosis not present

## 2022-12-14 LAB — SEDIMENTATION RATE: Sed Rate: 11 mm/hr (ref 0–30)

## 2022-12-14 LAB — CK: Total CK: 59 U/L (ref 7–177)

## 2022-12-14 NOTE — Telephone Encounter (Signed)
At Dr. Zollie Pee request, pt reached out to family about her sister's ALS. Per pt, the ALS is genetic although her sister's children are not likely to get it. Pt father also had ALS.  No further info, if you have a specific question her sister could contact her [facility] providers to assist.

## 2022-12-14 NOTE — Patient Instructions (Addendum)
Thank you for coming in today. Thank you for coming in today.   Please get an Xray today before you leave   I've referred you to Neurology for a Nerve Conduction Study.  Let us know if you don't hear from them in one week.   You received an injection today. Seek immediate medical attention if the joint becomes red, extremely painful, or is oozing fluid.   Please get labs today before you leave   Check back after nerve conduction study

## 2022-12-14 NOTE — Progress Notes (Signed)
I, Kelli [name], CMA acting as a scribe for Kelli Graham, MD.  Kelli [name] is a 77 y.o. female who presents to [facility] at [facility] today for bilat knee pain. Pt was previously seen by Dr. Denyse [name] on 09/06/22 R shoulder and bilat knee pain and weakness, and she was referred to PT, completing 15 visits.   Today, pt reports worsening knee sx since last visit. Does not feel that PT has been helpful. The knees continue to give out. The knee feels like it is shifting during ambulation. Wakes with aching in the thighs every morning, has started stretching at night. Now having pain distal and deep to patella. Denies swelling around the knees. Not taking anything for the knee sx because sx are not constant.  Pain extends into the thighs.  Knee swelling: no Mechanical symptoms: yes Aggravates: ambulation, stairs Treatments tried: PT, pool work out  Her sister has ALS.  Kelli [name] is worried that she may be having it to.  She is pretty sure she does not but is interested in testing.  Pertinent review of systems: No fevers or chills  Relevant historical information: History of lung cancer.  Sister ALS.  Diabetes.   Exam:  BP 110/72   Pulse 66   Ht 5\' 8"  (1.727 m)   Wt 180 lb (81.6 kg)   LMP  (LMP Unknown)   SpO2 97%   BMI 27.37 kg/m  General: Well Developed, well nourished, and in no acute distress.   MSK: Right knee: Mild effusion.  Normal motion with crepitation.  Intact strength.    Lab and Radiology Results  Procedure: Real-time Ultrasound Guided Injection of right knee joint superior lateral patella space Device: Philips Affiniti 50G/GE Logiq Images permanently stored and available for review in PACS Verbal informed consent obtained.  Discussed risks and benefits of procedure. Warned about infection, bleeding, hyperglycemia damage to structures among others. Patient expresses understanding and agreement Time-out conducted.   Noted no overlying erythema,  induration, or other signs of local infection.   Skin prepped in a sterile fashion.   Local anesthesia: Topical Ethyl chloride.   With sterile technique and under real time ultrasound guidance: 40 mg of Kenalog and 2 mL of Marcaine injected into knee joint. Fluid seen entering the joint capsule.   Completed without difficulty   Pain immediately resolved suggesting accurate placement of the medication.   Advised to call if fevers/chills, erythema, induration, drainage, or persistent bleeding.   Images permanently stored and available for review in the ultrasound unit.  Impression: Technically successful ultrasound guided injection.   Procedure: Real-time Ultrasound Guided Injection of left knee joint superior lateral patella space Device: Philips Affiniti 50G/GE Logiq Images permanently stored and available for review in PACS Verbal informed consent obtained.  Discussed risks and benefits of procedure. Warned about infection, bleeding, hyperglycemia damage to structures among others. Patient expresses understanding and agreement Time-out conducted.   Noted no overlying erythema, induration, or other signs of local infection.   Skin prepped in a sterile fashion.   Local anesthesia: Topical Ethyl chloride.   With sterile technique and under real time ultrasound guidance: 40 mg of Kenalog and 2 mL of Marcaine injected into knee joint. Fluid seen entering the joint capsule.   Completed without difficulty   Pain immediately resolved suggesting accurate placement of the medication.   Advised to call if fevers/chills, erythema, induration, drainage, or persistent bleeding.   Images permanently stored and available for review in the ultrasound unit.  Impression: Technically successful ultrasound guided injection.  X-ray images bilateral knees obtained today personally and independently interpreted  Right knee: Mild medial compartment DJD.  Moderate patellofemoral DJD.  No acute fractures are  visible.  Left knee: Mild medial and patellofemoral DJD.  No acute fractures are present.  Await formal radiology review   Results for orders placed or performed in visit on 12/14/22 (from the past 72 hour(s))  CK     Status: None   Collection Time: 12/14/22  1:58 PM  Result Value Ref Range   Total CK 59 7 - 177 U/L  Sedimentation rate     Status: None   Collection Time: 12/14/22  1:58 PM  Result Value Ref Range   Sed Rate 11 0 - 30 mm/hr   No results found.     Assessment and Plan: 77 y.o. female with bilateral knee pain thought to be exacerbation of DJD.  Plan for steroid injection today.  Plan for limited lab workup listed above.  Additionally she is worried about the possibility of ALS.  Her sister has it and she is having some similar symptoms including some leg weakness at times.  I think is reasonable proceed with a nerve conduction study to rule in or rule out ALS.  I think is unlikely as does Kelli [name] but I probably would want testing as well in her situation   PDMP not reviewed this encounter. Orders Placed This Encounter  Procedures   [place] LIMITED JOINT SPACE STRUCTURES LOW BILAT(NO LINKED CHARGES)    Order Specific Question:   Reason for Exam (SYMPTOM  OR DIAGNOSIS REQUIRED)    Answer:   bilat knee pain    Order Specific Question:   Preferred imaging location?    Answer:   [name] Knee AP/LAT W/Sunrise Left    Standing Status:   Future    Number of Occurrences:   1    Standing Expiration Date:   01/14/2023    Order Specific Question:   Reason for Exam (SYMPTOM  OR DIAGNOSIS REQUIRED)    Answer:   bilateral knee pain    Order Specific Question:   Preferred imaging location?    Answer:   [name]   DG Knee AP/LAT W/Sunrise Right    Standing Status:   Future    Number of Occurrences:   1    Standing Expiration Date:   01/14/2023    Order Specific Question:   Reason for Exam (SYMPTOM  OR DIAGNOSIS REQUIRED)    Answer:    bilateral knee pain    Order Specific Question:   Preferred imaging location?    Answer:   [name]   Sedimentation rate    Standing Status:   Future    Number of Occurrences:   1    Standing Expiration Date:   12/14/2023   CK    Standing Status:   Future    Number of Occurrences:   1    Standing Expiration Date:   12/14/2023   Ambulatory referral to Neurology    Referral Priority:   Routine    Referral Type:   Consultation    Referral Reason:   Specialty Services Required    Requested Specialty:   Neurology    Number of Visits Requested:   1   NCV with EMG(electromyography)    Standing Status:   Future    Standing Expiration Date:   12/14/2023    Scheduling Instructions:  Bilat LE NCV study    Order Specific Question:   Where should this test be performed?    Answer:   [facility]   No orders of the defined types were placed in this encounter.    Discussed warning signs or symptoms. Please see discharge instructions. Patient expresses understanding.   The above documentation has been reviewed and is accurate and complete Kelli [name], M.D.

## 2022-12-15 NOTE — Telephone Encounter (Signed)
Noted  

## 2022-12-15 NOTE — Progress Notes (Signed)
Labs look okay.

## 2022-12-19 ENCOUNTER — Other Ambulatory Visit: Payer: Self-pay | Admitting: Family Medicine

## 2022-12-19 DIAGNOSIS — F418 Other specified anxiety disorders: Secondary | ICD-10-CM

## 2022-12-20 ENCOUNTER — Other Ambulatory Visit: Payer: Self-pay

## 2022-12-20 DIAGNOSIS — E785 Hyperlipidemia, unspecified: Secondary | ICD-10-CM

## 2022-12-20 MED ORDER — PRAVASTATIN SODIUM 80 MG PO TABS
80.0000 mg | ORAL_TABLET | Freq: Every day | ORAL | 2 refills | Status: DC
Start: 2022-12-20 — End: 2023-09-12

## 2022-12-21 NOTE — Progress Notes (Signed)
Right knee x-ray shows some arthritis.

## 2022-12-21 NOTE — Progress Notes (Signed)
Left knee x-ray shows mild arthritis.

## 2022-12-22 ENCOUNTER — Encounter: Payer: Self-pay | Admitting: Neurology

## 2023-01-01 ENCOUNTER — Ambulatory Visit (INDEPENDENT_AMBULATORY_CARE_PROVIDER_SITE_OTHER): Payer: No Typology Code available for payment source | Admitting: Family Medicine

## 2023-01-01 ENCOUNTER — Encounter: Payer: Self-pay | Admitting: Family Medicine

## 2023-01-01 ENCOUNTER — Telehealth: Payer: Self-pay

## 2023-01-01 VITALS — BP 112/78 | HR 72 | Ht 68.0 in | Wt 175.0 lb

## 2023-01-01 DIAGNOSIS — M25561 Pain in right knee: Secondary | ICD-10-CM | POA: Diagnosis not present

## 2023-01-01 DIAGNOSIS — G8929 Other chronic pain: Secondary | ICD-10-CM

## 2023-01-01 NOTE — Telephone Encounter (Signed)
Check coverage for Zilretta and Visco for right knee OA

## 2023-01-01 NOTE — Progress Notes (Signed)
    I, Stevenson Clinch, CMA acting as a scribe for Clementeen Graham, MD.  MAKISHA Kelli [name] is a 77 y.o. female who presents to [facility] at [facility] today for right knee pain. Pt was last seen by Dr. Denyse Amass on 12/13/21; XR and labs were ordered. Referral was placed to neurology for EMG / NCV, scheduled for 01/18/23. Pt received bilateral intra-articular steroid injections for the knees.   Today pt reports feeling sharp pain at the knee radiating into the upper anterior thigh. Sx started when laying in bed and flexing the knee. Still has pain with flexion. Denies swelling in the knee.    Diagnostic Imaging 12/14/22 - XR bilat knees   Pertinent review of systems: No fevers or chills  Relevant historical information: History of lung cancer   Exam:  BP 112/78   Pulse 72   Ht 5\' 8"  (1.727 m)   Wt 175 lb (79.4 kg)   LMP  (LMP Unknown)   SpO2 98%   BMI 26.61 kg/m  General: Well Developed, well nourished, and in no acute distress.   MSK: Right knee normal-appearing Normal motion pain with full flexion. Nontender. Intact strength. Normal gait.    Lab and Radiology Results  EXAM: RIGHT KNEE 3 VIEWS   COMPARISON:  None Available.   FINDINGS: Mild medial and lateral spur formation and moderate patellofemoral joint space narrowing and associated spur formation. No effusion. No fracture or dislocation. Possible 2 possible faintly calcified posterior loose bodies.   IMPRESSION: 1. Tricompartmental degenerative changes, most prominent in the patellofemoral compartment. 2. Possible 2 faintly calcified posterior loose bodies.     Electronically Signed   By: Beckie Salts M.D.   On: 12/20/2022 15:48   I, Clementeen Graham, personally (independently) visualized and performed the interpretation of the images attached in this note.      Assessment and Plan: 77 y.o. female with right anterior knee pain thought to be due to exacerbation of patellofemoral DJD seen on  recent x-ray.  Her pain is already improving today after a little bit of watchful waiting.  She had steroid injection about 3 weeks ago.  Will go ahead and work on authorization now for hyaluronic acid injections or Zilretta injection.  We could do that in the future if needed.  She will let me know how she is feeling.  In the meantime recommend Voltaren gel and continue activity as tolerated.   PDMP not reviewed this encounter. No orders of the defined types were placed in this encounter.  No orders of the defined types were placed in this encounter.    Discussed warning signs or symptoms. Please see discharge instructions. Patient expresses understanding.   The above documentation has been reviewed and is accurate and complete Clementeen Graham, M.D.

## 2023-01-01 NOTE — Patient Instructions (Addendum)
Thank you for coming in today.   Use the diclofenac gel up to x4 daily.   We will ask your insurance about gel shots and Zilretta injections.   Let me know if this is not working.

## 2023-01-08 ENCOUNTER — Telehealth: Payer: Self-pay

## 2023-01-08 NOTE — Telephone Encounter (Signed)
VOB initiated for ORTHOVISC for RIGHT knee OA.   (Also checking Zilretta)

## 2023-01-08 NOTE — Telephone Encounter (Signed)
Check coverage for Zilretta and Visco for right knee OA

## 2023-01-08 NOTE — Telephone Encounter (Signed)
VOB initiated for RIGHT knee OA.

## 2023-01-09 NOTE — Telephone Encounter (Signed)
No Prior Auth required

## 2023-01-09 NOTE — Telephone Encounter (Signed)
Zilretta for RIGHT knee OA (Also checking Orthovisc)  Primary Insurance: [employer] Co-pay: $15 Co-insurance: 20% Deductible: does not apply Prior Auth: NOT required   Knee Injection History: 12/14/22 - Kenalog BILAT

## 2023-01-10 NOTE — Progress Notes (Signed)
Initial neurology clinic note  Reason for Evaluation: Consultation requested by Rodolph Bong, MD for an opinion regarding burning in thighs. My final recommendations will be communicated back to the requesting physician by way of shared medical record or letter to requesting physician via [place] mail.  HPI: This is Ms. Kelli [name], a 77 y.o. right-handed female with a medical history of DM, HLD, depression, anxiety, OA, colon cancer who presents to neurology clinic with the chief complaint of burning in thighs. The patient is alone today.  Patient was getting medication for colon cancer in 2010 and noticed burning in her feet and knee pain and weakness. The burning in her feet went away. The knee weakness improved, but now is occurring again. She has off and on cramps in her legs and burning in her anterior thighs. She has internal sensation of like contrast is being given (not tingling, not warm, but something moving through her). She was sent to PT for leg weakness, but patient denies leg weakness. She denies muscle twitching.  Patient sees Dr. Denyse Amass in sports medicine for bilateral knee pain. She has gotten steroid shots that is  helping. She mentioned aching in thighs every morning. She started stretching at night.  Patient has a sister with ALS that pasted away a couple of weeks ago. Patient's father also had ALS. Patient is worried about this (though she does not think she has, would like to be tested). There was mention of EMG to rule it out.  Of note, patient takes a B complex for many years. She is not aware of vitamin deficiencies though.  The patient denies symptoms suggestive of oculobulbar weakness including diplopia, ptosis, dysphagia, poor saliva control, dysarthria/dysphonia, impaired mastication, facial weakness/droop.  There are no neuromuscular respiratory weakness symptoms, particularly orthopnea>dyspnea.   Pseudobulbar affect is absent.  She does not report any  constitutional symptoms like fever, night sweats, anorexia or unintentional weight loss.  EtOH use: 3 times a week she may have 2 beers  Restrictive diet? No   MEDICATIONS:  Outpatient Encounter Medications as of 01/18/2023  Medication Sig   AMBULATORY NON FORMULARY MEDICATION Single glucometer with lancets, test strips. Test daily.  Disp qs 3 months E11.9   anastrozole (ARIMIDEX) 1 MG tablet Take 1 tablet (1 mg total) by mouth daily.   B Complex-C (B-COMPLEX WITH VITAMIN C) tablet Take 1 tablet by mouth daily.   B-D UF III MINI PEN NEEDLES 31G X 5 MM MISC USE AS DIRECTED   Calcium Carb-Cholecalciferol (CALCIUM 1000 + D) 1000-20 MG-MCG TABS Take 1 tablet by mouth daily.   Cholecalciferol (VITAMIN D) 2000 UNITS tablet Take 2,000 Units by mouth daily.   cloNIDine (CATAPRES) 0.1 MG tablet TAKE 1 TABLET BY MOUTH EVERY DAY   FLUoxetine (PROZAC) 40 MG capsule TAKE 1 CAPSULE BY MOUTH EVERY DAY   gabapentin (NEURONTIN) 300 MG capsule TAKE 1 CAPSULE (300 MG TOTAL) BY MOUTH AT BEDTIME AS NEEDED FOR INSOMNIA   glucose blood (ONETOUCH VERIO) test strip TEST BLOOD SUGARS DAILY   Insulin Glargine (BASAGLAR KWIKPEN) 100 UNIT/ML 35 UNITS AT BED INCREASE BY 2 UNITS EVERY 3 DAYS TIL FASTING SUGAR GOAL 120-150 MAX 60 UNITS PER DAY   Lancets (ONETOUCH DELICA PLUS LANCET33G) MISC TEST BLOOD SUGARS DAILY   pravastatin (PRAVACHOL) 80 MG tablet Take 1 tablet (80 mg total) by mouth daily.   Facility-Administered Encounter Medications as of 01/18/2023  Medication   denosumab (PROLIA) injection 60 mg    PAST MEDICAL HISTORY:  Past Medical History:  Diagnosis Date   Anxiety    Arthritis    Borderline diabetic    Colon cancer (HCC)    colon ca dx 02/2009, lung cancer   Depression    Depression with anxiety 09/19/2011   Diabetes mellitus type 2, uncomplicated (HCC) 12/31/2014   Genetic testing 06/06/2022   H/O colon cancer, stage II 09/19/2011   T3N0  Cecum Sept 2010   High cholesterol    Hyperlipidemia  09/19/2011   Lung nodules 09/19/2011   granulomas   Osteoporosis 2022   Urinary urgency     PAST SURGICAL HISTORY: Past Surgical History:  Procedure Laterality Date   APPENDECTOMY     AUGMENTATION MAMMAPLASTY     BREAST LUMPECTOMY WITH RADIOACTIVE SEED LOCALIZATION Right 03/22/2022   Procedure: RIGHT BREAST LUMPECTOMY WITH RADIOACTIVE SEED LOCALIZATION;  Surgeon: Almond Lint, MD;  Location: [facility];  Service: General;  Laterality: Right;   BREAST SURGERY  1978   augmentation   COLON SURGERY     2010   CRYO INTERCOSTAL NERVE BLOCK N/A 10/05/2014   Procedure: CRYO INTERCOSTAL NERVE BLOCK;  Surgeon: Loreli Slot, MD;  Location: [facility] OR;  Service: Thoracic;  Laterality: N/A;   EYE SURGERY     cataracts   LYMPH NODE DISSECTION Right 10/05/2014   Procedure: LYMPH NODE DISSECTION;  Surgeon: Loreli Slot, MD;  Location: [facility] OR;  Service: Thoracic;  Laterality: Right;   ORIF HUMERUS FRACTURE Left 10/01/2018   Procedure: OPEN REDUCTION INTERNAL FIXATION (ORIF) LEFT PROXIMAL HUMERUS FRACTURE;  Surgeon: Cammy Copa, MD;  Location: [facility] OR;  Service: Orthopedics;  Laterality: Left;   PARTIAL COLECTOMY Right 2010   SEGMENTECOMY Right 10/05/2014   Procedure: right lower lobe SEGMENTECTOMY;  Surgeon: Loreli Slot, MD;  Location: [facility] OR;  Service: Thoracic;  Laterality: Right;   VIDEO ASSISTED THORACOSCOPY (VATS)/WEDGE RESECTION Right 10/05/2014   Procedure: Right VIDEO ASSISTED THORACOSCOPY (VATS) with right lower lobe lung nodule WEDGE RESECTION;  Surgeon: Loreli Slot, MD;  Location: [facility] OR;  Service: Thoracic;  Laterality: Right;    ALLERGIES: No Known Allergies  FAMILY HISTORY: Family History  Problem Relation Age of Onset   Heart disease Mother    Colon polyps Sister    Lung cancer Sister    Heart disease Brother    Cancer Brother        unk type   Breast cancer Niece    Breast cancer Niece    Other Niece        gene +,  possibly BRCA   Colon cancer Neg Hx    Esophageal cancer Neg Hx    Stomach cancer Neg Hx    Rectal cancer Neg Hx     SOCIAL HISTORY: Social History   Tobacco Use   Smoking status: Former    Current packs/day: 0.00    Average packs/day: 3.0 packs/day for 48.7 years (146.0 ttl pk-yrs)    Types: Cigarettes    Start date: 78    Quit date: 02/06/2009    Years since quitting: 13.9   Smokeless tobacco: Never  Vaping Use   Vaping status: Never Used  Substance Use Topics   Alcohol use: Yes    Alcohol/week: 2.0 standard drinks of alcohol    Types: 2 Cans of beer per week    Comment: 1-2 beers every other day   Drug use: Yes    Types: Marijuana    Comment: occasionally   Social History   Social  History Narrative   Lives alone with her puppy. She does not have any children. She enjoys reading, stream movies & series and swimming.   Right handed   One story home     OBJECTIVE: PHYSICAL EXAM: BP (!) 117/56   Pulse 76   Resp 20   Ht 5\' 8"  (1.727 m)   Wt 177 lb (80.3 kg)   LMP  (LMP Unknown)   SpO2 95%   BMI 26.91 kg/m   General: General appearance: Awake and alert. No distress. Cooperative with exam.  Skin: No obvious rash or jaundice. HEENT: Atraumatic. Anicteric. Lungs: Non-labored breathing on room air  Extremities: No edema. No obvious deformity.   Neurological: Mental Status: Alert. Speech fluent. No pseudobulbar affect Cranial Nerves: CNII: No RAPD. Visual fields grossly intact. CNIII, IV, VI: PERRL. No nystagmus. EOMI. CN V: Facial sensation intact bilaterally to fine touch. Masseter clench strong. Jaw jerk negative. CN VII: Facial muscles symmetric and strong. No ptosis at rest. CN VIII: Hearing grossly intact bilaterally. CN IX: No hypophonia. CN X: Palate elevates symmetrically. CN XI: Full strength shoulder shrug bilaterally. CN XII: Tongue protrusion full and midline. No atrophy or obvious fasciculations. No significant dysarthria Motor: Tone is  normal. No fasciculations in any extremities. No atrophy. Strength 5/5 in bilateral upper and lower extremities  Reflexes:  Right Left   Bicep 2-3+ 2-3+   Tricep 2-3+ 2-3+   BrRad 2-3+ 2-3+   Knee 3+ 3+ Bilateral cross adductors  Ankle Trace Trace    Pathological Reflexes: Babinski: flexor response bilaterally Hoffman: absent bilaterally Troemner: absent bilaterally Facial: absent bilaterally Midline tap: absent Sensation: Pinprick: Intact in all extremities Coordination: Intact finger-to- nose-finger bilaterally. Romberg negative. Gait: Able to rise from chair with arms crossed unassisted. Normal, narrow-based gait. Able to walk on toes and heels  Lab and Test Review: Internal labs: 12/14/22: ESR wnl CK 59  10/02/22: CBC unremarkable CMP unremarkable Lipid panel: Component     Latest Ref Rng 10/02/2022  Cholesterol     <200 mg/dL 604   HDL Cholesterol     > OR = 50 mg/dL 57   Triglycerides     <150 mg/dL 86   LDL Cholesterol (Calc)     mg/dL (calc) 540 (H)   Total CHOL/HDL Ratio     <5.0 (calc) 3.3   Non-HDL Cholesterol (Calc)     <130 mg/dL (calc) 981 (H)     XBJ4N (09/14/22): 8.2  ASSESSMENT: Kelli [name] is a 77 y.o. female who presents for evaluation of burning in thighs and muscle cramps. She has a relevant medical history of DM, HLD, depression, anxiety, OA, colon cancer. Her neurological examination is essentially normal. The etiology of patient's intermittent burning of thighs and muscle cramps is currently unclear. She has a strong family history of ALS (father and sister) and was concerned about this. There is no current evidence of ALS and she is not weak. We discussed EMG, but patient was reassured and did not feel EMG was necessary currently.  PLAN: -Blood work: PTH, Vit D, ionized Ca, TSH, B12, B1 -EMG discussed, but patient preferred to defer for now -OTC cramp recommendations given to patient (AVS)  -Return to clinic as needed  The impression  above as well as the plan as outlined below were extensively discussed with the patient who voiced understanding. All questions were answered to their satisfaction.  When available, results of the above investigations and possible further recommendations will be communicated to the patient via  telephone/MyChart. Patient to call office if not contacted after expected testing turnaround time.   Total time spent reviewing records, interview, history/exam, documentation, and coordination of care on day of encounter:  50 min   Thank you for allowing me to participate in patient's care.  If I can answer any additional questions, I would be pleased to do so.  Jacquelyne Balint, MD   CC: Everrett Coombe, DO [address]  [address] [place] [address]  CC: Referring provider: Rodolph Bong, MD [address] [place],  [place] [address]

## 2023-01-10 NOTE — Telephone Encounter (Signed)
Pending

## 2023-01-10 NOTE — Telephone Encounter (Signed)
Scheduled 01/22/23

## 2023-01-11 NOTE — Telephone Encounter (Signed)
Per [employer] portal - working through issues with payer

## 2023-01-15 ENCOUNTER — Ambulatory Visit: Payer: No Typology Code available for payment source | Admitting: Family Medicine

## 2023-01-15 NOTE — Telephone Encounter (Signed)
Prior Auth required for ORTHOVISC for RIGHT knee OA.

## 2023-01-16 ENCOUNTER — Ambulatory Visit (INDEPENDENT_AMBULATORY_CARE_PROVIDER_SITE_OTHER): Payer: No Typology Code available for payment source | Admitting: Family Medicine

## 2023-01-16 ENCOUNTER — Encounter: Payer: Self-pay | Admitting: Family Medicine

## 2023-01-16 VITALS — BP 143/68 | HR 78 | Ht 68.0 in | Wt 178.0 lb

## 2023-01-16 DIAGNOSIS — Z794 Long term (current) use of insulin: Secondary | ICD-10-CM

## 2023-01-16 DIAGNOSIS — E119 Type 2 diabetes mellitus without complications: Secondary | ICD-10-CM

## 2023-01-16 DIAGNOSIS — E785 Hyperlipidemia, unspecified: Secondary | ICD-10-CM

## 2023-01-16 DIAGNOSIS — F418 Other specified anxiety disorders: Secondary | ICD-10-CM

## 2023-01-16 LAB — POCT GLYCOSYLATED HEMOGLOBIN (HGB A1C): HbA1c, POC (controlled diabetic range): 7.7 % — AB (ref 0.0–7.0)

## 2023-01-16 NOTE — Progress Notes (Signed)
Kelli [name] - 77 y.o. female MRN [id number]  Date of birth: 05/19/1946  Subjective Chief Complaint  Patient presents with   Diabetes    HPI Kelli [name] is a 77 y.o. female here today for follow up visit.   She reports that she is doing well.    Remains on clonidine for management of HTN.  This is working well for her and she denies side effects from this at current strength.  She has not had chest pain, shortness of breath, palpitations, headaches or vision changes.  A1c is improved today to 7.7%.  She has been working on some dietary changes.  She has also been more active.  Continues on [employer].  Denies hypoglycemia.    Mood is well-controlled with fluoxetine at current strength.  Remains on gabapentin for insomnia.  This is working well for her.  ROS:  A comprehensive ROS was completed and negative except as noted per HPI  No Known Allergies  Past Medical History:  Diagnosis Date   Anxiety    Arthritis    Borderline diabetic    Colon cancer (HCC)    colon ca dx 02/2009, lung cancer   Depression    Depression with anxiety 09/19/2011   Diabetes mellitus type 2, uncomplicated (HCC) 12/31/2014   Genetic testing 06/06/2022   H/O colon cancer, stage II 09/19/2011   T3N0  Cecum Sept 2010   High cholesterol    Hyperlipidemia 09/19/2011   Lung nodules 09/19/2011   granulomas   Osteoporosis 2022   Urinary urgency     Past Surgical History:  Procedure Laterality Date   APPENDECTOMY     AUGMENTATION MAMMAPLASTY     BREAST LUMPECTOMY WITH RADIOACTIVE SEED LOCALIZATION Right 03/22/2022   Procedure: RIGHT BREAST LUMPECTOMY WITH RADIOACTIVE SEED LOCALIZATION;  Surgeon: Almond Lint, MD;  Location: [facility];  Service: General;  Laterality: Right;   BREAST SURGERY  1978   augmentation   COLON SURGERY     2010   CRYO INTERCOSTAL NERVE BLOCK N/A 10/05/2014   Procedure: CRYO INTERCOSTAL NERVE BLOCK;  Surgeon: Loreli Slot, MD;  Location: [facility] OR;   Service: Thoracic;  Laterality: N/A;   EYE SURGERY     cataracts   LYMPH NODE DISSECTION Right 10/05/2014   Procedure: LYMPH NODE DISSECTION;  Surgeon: Loreli Slot, MD;  Location: [facility] OR;  Service: Thoracic;  Laterality: Right;   ORIF HUMERUS FRACTURE Left 10/01/2018   Procedure: OPEN REDUCTION INTERNAL FIXATION (ORIF) LEFT PROXIMAL HUMERUS FRACTURE;  Surgeon: Cammy Copa, MD;  Location: [facility] OR;  Service: Orthopedics;  Laterality: Left;   PARTIAL COLECTOMY Right 2010   SEGMENTECOMY Right 10/05/2014   Procedure: right lower lobe SEGMENTECTOMY;  Surgeon: Loreli Slot, MD;  Location: [facility] OR;  Service: Thoracic;  Laterality: Right;   VIDEO ASSISTED THORACOSCOPY (VATS)/WEDGE RESECTION Right 10/05/2014   Procedure: Right VIDEO ASSISTED THORACOSCOPY (VATS) with right lower lobe lung nodule WEDGE RESECTION;  Surgeon: Loreli Slot, MD;  Location: [facility] OR;  Service: Thoracic;  Laterality: Right;    Social History   Socioeconomic History   Marital status: Divorced    Spouse name: Not on file   Number of children: 0   Years of education: 16   Highest education level: Bachelor's degree (e.g., BA, AB, BS)  Occupational History    Comment: Retired.  Tobacco Use   Smoking status: Former    Current packs/day: 0.00    Average packs/day: 3.0 packs/day for 48.7 years (146.0  ttl pk-yrs)    Types: Cigarettes    Start date: 47    Quit date: 02/06/2009    Years since quitting: 13.9   Smokeless tobacco: Never  Vaping Use   Vaping status: Never Used  Substance and Sexual Activity   Alcohol use: Yes    Alcohol/week: 2.0 standard drinks of alcohol    Types: 2 Cans of beer per week    Comment: 1-2 beers every other day   Drug use: Yes    Types: Marijuana    Comment: occasionally   Sexual activity: Not Currently    Birth control/protection: Post-menopausal  Other Topics Concern   Not on file  Social History Narrative   Lives alone with her puppy. She does not have any  children. She enjoys reading, stream movies & series and swimming.   Social Determinants of Health   Financial Resource Strain: Low Risk  (11/02/2022)   Overall Financial Resource Strain (CARDIA)    Difficulty of Paying Living Expenses: Not hard at all  Food Insecurity: No Food Insecurity (11/02/2022)   Hunger Vital Sign    Worried About Running Out of Food in the Last Year: Never true    Ran Out of Food in the Last Year: Never true  Transportation Needs: No Transportation Needs (11/02/2022)   PRAPARE - Administrator, Civil Service (Medical): No    Lack of Transportation (Non-Medical): No  Physical Activity: Insufficiently Active (11/02/2022)   Exercise Vital Sign    Days of Exercise per Week: 1 day    Minutes of Exercise per Session: 20 min  Stress: No Stress Concern Present (11/02/2022)   [employer] of Occupational Health - Occupational Stress Questionnaire    Feeling of Stress : Not at all  Social Connections: Moderately Isolated (11/06/2022)   Social Connection and Isolation Panel [NHANES]    Frequency of Communication with Friends and Family: More than three times a week    Frequency of Social Gatherings with Friends and Family: Once a week    Attends Religious Services: 1 to 4 times per year    Active Member of [employer] or Organizations: No    Attends Banker Meetings: Never    Marital Status: Divorced    Family History  Problem Relation Age of Onset   Heart disease Mother    Colon polyps Sister    Lung cancer Sister    Heart disease Brother    Cancer Brother        unk type   Breast cancer Niece    Breast cancer Niece    Other Niece        gene +, possibly BRCA   Colon cancer Neg Hx    Esophageal cancer Neg Hx    Stomach cancer Neg Hx    Rectal cancer Neg Hx     Health Maintenance  Topic Date Due   OPHTHALMOLOGY EXAM  11/18/2022   INFLUENZA VACCINE  01/04/2023   COVID-19 Vaccine (6 - 2023-24 season) 07/07/2023 (Originally 05/25/2022)    Diabetic kidney evaluation - Urine ACR  02/17/2023   HEMOGLOBIN A1C  07/19/2023   FOOT EXAM  09/14/2023   Diabetic kidney evaluation - eGFR measurement  10/02/2023   Medicare Annual Wellness (AWV)  12/06/2023   MAMMOGRAM  02/18/2024   DEXA SCAN  12/05/2024   Colonoscopy  01/29/2025   DTaP/Tdap/Td (2 - Td or Tdap) 10/01/2028   Pneumonia Vaccine 53+ Years old  Completed   Hepatitis C Screening  Completed  Zoster Vaccines- Shingrix  Completed   HPV VACCINES  Aged Out     ----------------------------------------------------------------------------------------------------------------------------------------------------------------------------------------------------------------- Physical Exam BP (!) 143/68 (BP Location: Left Arm, Patient Position: Sitting, Cuff Size: Normal)   Pulse 78   Ht 5\' 8"  (1.727 m)   Wt 178 lb (80.7 kg)   LMP  (LMP Unknown)   SpO2 98%   BMI 27.06 kg/m   Physical Exam Constitutional:      Appearance: Normal appearance.  HENT:     Head: Normocephalic and atraumatic.  Eyes:     General: No scleral icterus. Cardiovascular:     Rate and Rhythm: Normal rate and regular rhythm.  Pulmonary:     Effort: Pulmonary effort is normal.     Breath sounds: Normal breath sounds.  Musculoskeletal:     Cervical back: Neck supple.  Neurological:     Mental Status: She is alert.  Psychiatric:        Mood and Affect: Mood normal.        Behavior: Behavior normal.     ------------------------------------------------------------------------------------------------------------------------------------------------------------------------------------------------------------------- Assessment and Plan  Diabetes mellitus type 2, uncomplicated (HCC) Blood sugars are better controlled.  She will continue insulin at current strength.  Encouraged continued dietary change.  Hyperlipidemia Tolerating pravastatin well at current strength.  Will plan to  continue.  Depression with anxiety She is doing well fluoxetine at current strength.  Will plan to continue gabapentin for anxiety and insomnia.   No orders of the defined types were placed in this encounter.   Return in about 4 months (around 05/18/2023) for F/u T2DM.    This visit occurred during the SARS-CoV-2 public health emergency.  Safety protocols were in place, including screening questions prior to the visit, additional usage of staff PPE, and extensive cleaning of exam room while observing appropriate contact time as indicated for disinfecting solutions.

## 2023-01-16 NOTE — Assessment & Plan Note (Signed)
Tolerating pravastatin well at current strength.  Will plan to continue.

## 2023-01-16 NOTE — Assessment & Plan Note (Signed)
Blood sugars are better controlled.  She will continue insulin at current strength.  Encouraged continued dietary change.

## 2023-01-16 NOTE — Assessment & Plan Note (Signed)
She is doing well fluoxetine at current strength.  Will plan to continue gabapentin for anxiety and insomnia.

## 2023-01-18 ENCOUNTER — Other Ambulatory Visit (INDEPENDENT_AMBULATORY_CARE_PROVIDER_SITE_OTHER): Payer: No Typology Code available for payment source

## 2023-01-18 ENCOUNTER — Ambulatory Visit (INDEPENDENT_AMBULATORY_CARE_PROVIDER_SITE_OTHER): Payer: No Typology Code available for payment source | Admitting: Neurology

## 2023-01-18 ENCOUNTER — Encounter: Payer: Self-pay | Admitting: Neurology

## 2023-01-18 VITALS — BP 117/56 | HR 76 | Resp 20 | Ht 68.0 in | Wt 177.0 lb

## 2023-01-18 DIAGNOSIS — M25561 Pain in right knee: Secondary | ICD-10-CM | POA: Diagnosis not present

## 2023-01-18 DIAGNOSIS — M79652 Pain in left thigh: Secondary | ICD-10-CM | POA: Diagnosis not present

## 2023-01-18 DIAGNOSIS — M79651 Pain in right thigh: Secondary | ICD-10-CM

## 2023-01-18 DIAGNOSIS — M25562 Pain in left knee: Secondary | ICD-10-CM | POA: Diagnosis not present

## 2023-01-18 DIAGNOSIS — R252 Cramp and spasm: Secondary | ICD-10-CM

## 2023-01-18 DIAGNOSIS — G8929 Other chronic pain: Secondary | ICD-10-CM | POA: Diagnosis not present

## 2023-01-18 NOTE — Patient Instructions (Addendum)
I saw you today for thigh burning and muscle cramps. You examination and history is not consistent with ALS.  I will investigate your symptoms further with blood work today.   We discussed nerve testing with EMG to look into ALS further, but we agree to hold off on this for now as you do not have typical ALS symptoms or examination.  - Recommend the following measures that may provide some symptomatic benefit for muscle twitching and/or cramps: - Adequate oral clear fluid intake to maintain optimal hydration (about 2.5 liters, or around 8-10 glasses per day) Avoidance of caffeine Trial of DIET tonic water: About 1 glass, up to 6 times daily Magnesium oxide up to 400 mg by mouth twice daily, as needed (over the counter) Gentle muscle stretching routine, especially before bedtime    Please contact me if you have new or worsening symptoms, especially muscle weakness.  The physicians and staff at [facility] are committed to providing excellent care. You may receive a survey requesting feedback about your experience at our office. We strive to receive "very good" responses to the survey questions. If you feel that your experience would prevent you from giving the office a "very good " response, please contact our office to try to remedy the situation. We may be reached at [phone]. Thank you for taking the time out of your busy day to complete the survey.  Jacquelyne Balint, MD [facility]  Labs today suite [phone]

## 2023-01-19 LAB — TSH: TSH: 2.65 u[IU]/mL (ref 0.35–5.50)

## 2023-01-19 LAB — VITAMIN D 25 HYDROXY (VIT D DEFICIENCY, FRACTURES): VITD: 34.45 ng/mL (ref 30.00–100.00)

## 2023-01-19 LAB — VITAMIN B12: Vitamin B-12: 303 pg/mL (ref 211–911)

## 2023-01-22 ENCOUNTER — Encounter: Payer: Self-pay | Admitting: Family Medicine

## 2023-01-22 ENCOUNTER — Ambulatory Visit: Payer: No Typology Code available for payment source | Admitting: Family Medicine

## 2023-01-22 ENCOUNTER — Other Ambulatory Visit: Payer: Self-pay

## 2023-01-22 VITALS — BP 122/76 | HR 61 | Ht 68.0 in | Wt 177.6 lb

## 2023-01-22 DIAGNOSIS — M1711 Unilateral primary osteoarthritis, right knee: Secondary | ICD-10-CM | POA: Diagnosis not present

## 2023-01-22 DIAGNOSIS — G8929 Other chronic pain: Secondary | ICD-10-CM

## 2023-01-22 LAB — VITAMIN B1: Vitamin B1 (Thiamine): 34 nmol/L — ABNORMAL HIGH (ref 8–30)

## 2023-01-22 LAB — PARATHYROID HORMONE, INTACT (NO CA): PTH: 21 pg/mL (ref 16–77)

## 2023-01-22 LAB — CALCIUM, IONIZED: Calcium, Ion: 5.4 mg/dL (ref 4.7–5.5)

## 2023-01-22 MED ORDER — TRIAMCINOLONE ACETONIDE 32 MG IX SRER
32.0000 mg | Freq: Once | INTRA_ARTICULAR | Status: AC
Start: 2023-01-22 — End: 2023-01-22
  Administered 2023-01-22: 32 mg via INTRA_ARTICULAR

## 2023-01-22 NOTE — Progress Notes (Signed)
   I, Stevenson Clinch, CMA acting as a scribe for Clementeen Graham, MD.  Kelli [name] is a 77 y.o. female who presents to [facility] at [facility] today for cont'd bilat knee pain. Pt was last seen by Dr. Denyse Amass on 01/01/23 at which point her R knee pain had improved. Plan for a watchful waiting while we work to authorize visco/Zilretta. Last bilat knee steroid injections was on 12/14/22.  Today, pt reports continued knee pain.  After discussion she would like to proceed with Zilretta injection first before proceeding with gel injections if necessary.  Dx imaging: 12/14/22 R & L knee XR  Pertinent review of systems: No fevers or chills  Relevant historical information: History lung cancer   Exam:  BP 122/76   Pulse 61   Ht 5\' 8"  (1.727 m)   Wt 177 lb 9.6 oz (80.6 kg)   LMP  (LMP Unknown)   SpO2 95%   BMI 27.00 kg/m  General: Well Developed, well nourished, and in no acute distress.   MSK: Right knee mild effusion normal-appearing otherwise normal motion.    Lab and Radiology Results   Zilretta injection right knee Procedure: Real-time Ultrasound Guided Injection of right knee joint superior lateral patellar space Device: Philips Affiniti 50G Images permanently stored and available for review in PACS Verbal informed consent obtained.  Discussed risks and benefits of procedure. Warned about infection, hyperglycemia bleeding, damage to structures among others. Patient expresses understanding and agreement Time-out conducted.   Noted no overlying erythema, induration, or other signs of local infection.   Skin prepped in a sterile fashion.   Local anesthesia: Topical Ethyl chloride.   With sterile technique and under real time ultrasound guidance: Zilretta 32 mg injected into knee joint. Fluid seen entering the joint capsule.   Completed without difficulty   Advised to call if fevers/chills, erythema, induration, drainage, or persistent bleeding.   Images  permanently stored and available for review in the ultrasound unit.  Impression: Technically successful ultrasound guided injection.  Lot number: [id number]     Assessment and Plan: 77 y.o. female with chronic right knee pain due to DJD.  Zilretta injection performed today.  If this is not effective we can proceed to gel injections which are also improved.   PDMP not reviewed this encounter. Orders Placed This Encounter  Procedures   [place] LIMITED JOINT SPACE STRUCTURES LOW BILAT(NO LINKED CHARGES)    Order Specific Question:   Reason for Exam (SYMPTOM  OR DIAGNOSIS REQUIRED)    Answer:   bilat knee pain    Order Specific Question:   Preferred imaging location?    Answer:   Adult nurse [facility] ordered this encounter  Medications   Triamcinolone Acetonide (ZILRETTA) intra-articular injection 32 mg     Discussed warning signs or symptoms. Please see discharge instructions. Patient expresses understanding.   The above documentation has been reviewed and is accurate and complete Clementeen Graham, M.D.

## 2023-01-22 NOTE — Patient Instructions (Signed)
Thank you for coming in today.   Call or go to the ER if you develop a large red swollen joint with extreme pain or oozing puss.    Let me know how this goes.   We do have the gel shots approved that we can do if needed.

## 2023-01-23 NOTE — Telephone Encounter (Signed)
Pt received Zilretta injection for RIGHT knee OA on 01/22/23 Can consider repeat inj on or after 04/16/23

## 2023-01-23 NOTE — Telephone Encounter (Signed)
Prior auth initiated for ORTHOVISC for RIGHT knee OA via [employer]

## 2023-01-24 NOTE — Telephone Encounter (Signed)
ORTHOVISC for RIGHT knee OA  (Also checking Zilretta)  Primary Insurance: [employer]  Co-pay: $15 Co-insurance: 20%  Deductible: does not apply  Prior Auth APPROVED PA# [id number] Valid: 01/23/23-06/05/23   Knee Injection History:  12/14/22 -  Kenalog RIGHT

## 2023-01-24 NOTE — Telephone Encounter (Signed)
Pt had Zilretta on 01/22/23.

## 2023-01-24 NOTE — Telephone Encounter (Signed)
ORTHOVISC for RIGHT Knee OA APPROVED PA# [id number] Valid: 01/23/23-06/05/23

## 2023-02-08 DIAGNOSIS — E1169 Type 2 diabetes mellitus with other specified complication: Secondary | ICD-10-CM | POA: Diagnosis not present

## 2023-02-08 DIAGNOSIS — E663 Overweight: Secondary | ICD-10-CM | POA: Diagnosis not present

## 2023-02-08 DIAGNOSIS — C50919 Malignant neoplasm of unspecified site of unspecified female breast: Secondary | ICD-10-CM | POA: Diagnosis not present

## 2023-02-08 DIAGNOSIS — Z008 Encounter for other general examination: Secondary | ICD-10-CM | POA: Diagnosis not present

## 2023-02-08 DIAGNOSIS — I700 Atherosclerosis of aorta: Secondary | ICD-10-CM | POA: Diagnosis not present

## 2023-02-08 DIAGNOSIS — Z6826 Body mass index (BMI) 26.0-26.9, adult: Secondary | ICD-10-CM | POA: Diagnosis not present

## 2023-02-08 DIAGNOSIS — E1122 Type 2 diabetes mellitus with diabetic chronic kidney disease: Secondary | ICD-10-CM | POA: Diagnosis not present

## 2023-02-08 DIAGNOSIS — E785 Hyperlipidemia, unspecified: Secondary | ICD-10-CM | POA: Diagnosis not present

## 2023-02-08 DIAGNOSIS — Z794 Long term (current) use of insulin: Secondary | ICD-10-CM | POA: Diagnosis not present

## 2023-02-08 DIAGNOSIS — F411 Generalized anxiety disorder: Secondary | ICD-10-CM | POA: Diagnosis not present

## 2023-02-08 DIAGNOSIS — G4700 Insomnia, unspecified: Secondary | ICD-10-CM | POA: Diagnosis not present

## 2023-02-08 DIAGNOSIS — N182 Chronic kidney disease, stage 2 (mild): Secondary | ICD-10-CM | POA: Diagnosis not present

## 2023-02-21 ENCOUNTER — Ambulatory Visit (INDEPENDENT_AMBULATORY_CARE_PROVIDER_SITE_OTHER): Payer: No Typology Code available for payment source | Admitting: Family Medicine

## 2023-02-21 ENCOUNTER — Encounter: Payer: Self-pay | Admitting: Family Medicine

## 2023-02-21 VITALS — BP 119/71 | HR 69 | Ht 68.0 in | Wt 177.6 lb

## 2023-02-21 DIAGNOSIS — E119 Type 2 diabetes mellitus without complications: Secondary | ICD-10-CM

## 2023-02-21 DIAGNOSIS — Z23 Encounter for immunization: Secondary | ICD-10-CM | POA: Diagnosis not present

## 2023-02-21 DIAGNOSIS — R21 Rash and other nonspecific skin eruption: Secondary | ICD-10-CM

## 2023-02-21 LAB — POCT UA - MICROALBUMIN
Albumin/Creatinine Ratio, Urine, POC: <30
Creatinine, POC: 200 mg/dL
Microalbumin Ur, POC: 30 mg/L

## 2023-02-21 MED ORDER — BETAMETHASONE DIPROPIONATE 0.05 % EX CREA: TOPICAL_CREAM | Freq: Two times a day (BID) | CUTANEOUS | 0 refills | Status: DC

## 2023-02-21 NOTE — Progress Notes (Signed)
Kelli [name] - 77 y.o. female MRN [id number]  Date of birth: 11-17-1945  Subjective Chief Complaint  Patient presents with   Rash    HPI Kelli [name] is a 77 y.o. female here today with complaint of rash.  She has rash on the arm.  Similar to rash that she had in 2022.  Prescribed betamethasone topical which cleared up the rash.  Rash is itchy.  She denies pain.  She has not had any drainage, increasing redness or warmth.  She denies fever/chills.  OTC hydrocortisone has not been effective.  She has tried Administrator, Civil Service detergent to free and clear detergent.    ROS:  A comprehensive ROS was completed and negative except as noted per HPI  No Known Allergies  Past Medical History:  Diagnosis Date   Anxiety    Arthritis    Borderline diabetic    Colon cancer (HCC)    colon ca dx 02/2009, lung cancer   Depression    Depression with anxiety 09/19/2011   Diabetes mellitus type 2, uncomplicated (HCC) 12/31/2014   Genetic testing 06/06/2022   H/O colon cancer, stage II 09/19/2011   T3N0  Cecum Sept 2010   High cholesterol    Hyperlipidemia 09/19/2011   Lung nodules 09/19/2011   granulomas   Osteoporosis 2022   Urinary urgency     Past Surgical History:  Procedure Laterality Date   APPENDECTOMY     AUGMENTATION MAMMAPLASTY     BREAST LUMPECTOMY WITH RADIOACTIVE SEED LOCALIZATION Right 03/22/2022   Procedure: RIGHT BREAST LUMPECTOMY WITH RADIOACTIVE SEED LOCALIZATION;  Surgeon: Almond Lint, MD;  Location: [facility];  Service: General;  Laterality: Right;   BREAST SURGERY  1978   augmentation   COLON SURGERY     2010   CRYO INTERCOSTAL NERVE BLOCK N/A 10/05/2014   Procedure: CRYO INTERCOSTAL NERVE BLOCK;  Surgeon: Loreli Slot, MD;  Location: [facility] OR;  Service: Thoracic;  Laterality: N/A;   EYE SURGERY     cataracts   LYMPH NODE DISSECTION Right 10/05/2014   Procedure: LYMPH NODE DISSECTION;  Surgeon: Loreli Slot, MD;  Location: [facility] OR;   Service: Thoracic;  Laterality: Right;   ORIF HUMERUS FRACTURE Left 10/01/2018   Procedure: OPEN REDUCTION INTERNAL FIXATION (ORIF) LEFT PROXIMAL HUMERUS FRACTURE;  Surgeon: Cammy Copa, MD;  Location: [facility] OR;  Service: Orthopedics;  Laterality: Left;   PARTIAL COLECTOMY Right 2010   SEGMENTECOMY Right 10/05/2014   Procedure: right lower lobe SEGMENTECTOMY;  Surgeon: Loreli Slot, MD;  Location: [facility] OR;  Service: Thoracic;  Laterality: Right;   VIDEO ASSISTED THORACOSCOPY (VATS)/WEDGE RESECTION Right 10/05/2014   Procedure: Right VIDEO ASSISTED THORACOSCOPY (VATS) with right lower lobe lung nodule WEDGE RESECTION;  Surgeon: Loreli Slot, MD;  Location: [facility] OR;  Service: Thoracic;  Laterality: Right;    Social History   Socioeconomic History   Marital status: Divorced    Spouse name: Not on file   Number of children: 0   Years of education: 16   Highest education level: Bachelor's degree (e.g., BA, AB, BS)  Occupational History    Comment: Retired.  Tobacco Use   Smoking status: Former    Current packs/day: 0.00    Average packs/day: 3.0 packs/day for 48.7 years (146.0 ttl pk-yrs)    Types: Cigarettes    Start date: 50    Quit date: 02/06/2009    Years since quitting: 14.0   Smokeless tobacco: Never  Vaping Use  Vaping status: Never Used  Substance and Sexual Activity   Alcohol use: Yes    Alcohol/week: 2.0 standard drinks of alcohol    Types: 2 Cans of beer per week    Comment: 1-2 beers every other day   Drug use: Yes    Types: Marijuana    Comment: occasionally   Sexual activity: Not Currently    Birth control/protection: Post-menopausal  Other Topics Concern   Not on file  Social History Narrative   Lives alone with her puppy. She does not have any children. She enjoys reading, stream movies & series and swimming.   Right handed   One story home   Social Determinants of Health   Financial Resource Strain: Low Risk  (11/02/2022)   Overall  Financial Resource Strain (CARDIA)    Difficulty of Paying Living Expenses: Not hard at all  Food Insecurity: No Food Insecurity (11/02/2022)   Hunger Vital Sign    Worried About Running Out of Food in the Last Year: Never true    Ran Out of Food in the Last Year: Never true  Transportation Needs: No Transportation Needs (11/02/2022)   PRAPARE - Administrator, Civil Service (Medical): No    Lack of Transportation (Non-Medical): No  Physical Activity: Insufficiently Active (11/02/2022)   Exercise Vital Sign    Days of Exercise per Week: 1 day    Minutes of Exercise per Session: 20 min  Stress: No Stress Concern Present (11/02/2022)   [employer] of Occupational Health - Occupational Stress Questionnaire    Feeling of Stress : Not at all  Social Connections: Moderately Isolated (11/06/2022)   Social Connection and Isolation Panel [NHANES]    Frequency of Communication with Friends and Family: More than three times a week    Frequency of Social Gatherings with Friends and Family: Once a week    Attends Religious Services: 1 to 4 times per year    Active Member of [employer] or Organizations: No    Attends Banker Meetings: Never    Marital Status: Divorced    Family History  Problem Relation Age of Onset   Heart disease Mother    Colon polyps Sister    Lung cancer Sister    Heart disease Brother    Cancer Brother        unk type   Breast cancer Niece    Breast cancer Niece    Other Niece        gene +, possibly BRCA   Colon cancer Neg Hx    Esophageal cancer Neg Hx    Stomach cancer Neg Hx    Rectal cancer Neg Hx     Health Maintenance  Topic Date Due   OPHTHALMOLOGY EXAM  11/18/2022   INFLUENZA VACCINE  01/04/2023   COVID-19 Vaccine (6 - 2023-24 season) 02/04/2023   Diabetic kidney evaluation - Urine ACR  02/17/2023   HEMOGLOBIN A1C  07/19/2023   FOOT EXAM  09/14/2023   Diabetic kidney evaluation - eGFR measurement  10/02/2023   Medicare Annual  Wellness (AWV)  12/06/2023   DEXA SCAN  12/05/2024   Colonoscopy  01/29/2025   DTaP/Tdap/Td (2 - Td or Tdap) 10/01/2028   Pneumonia Vaccine 75+ Years old  Completed   Hepatitis C Screening  Completed   Zoster Vaccines- Shingrix  Completed   HPV VACCINES  Aged Out     ----------------------------------------------------------------------------------------------------------------------------------------------------------------------------------------------------------------- Physical Exam BP 119/71 (BP Location: Left Arm, Patient Position: Sitting, Cuff Size: Normal)  Pulse 69   Ht 5\' 8"  (1.727 m)   Wt 177 lb 9.6 oz (80.6 kg)   LMP  (LMP Unknown)   SpO2 96%   BMI 27.00 kg/m   Physical Exam Constitutional:      Appearance: Normal appearance.  Skin:    Comments: Raised, slightly erythematous rash on R upper arm and forearm.   Neurological:     Mental Status: She is alert.     ------------------------------------------------------------------------------------------------------------------------------------------------------------------------------------------------------------------- Assessment and Plan  Rash Atopic vs contact dermatitis.  Responded to betamethasone previously.  Will add this back on.  She will let me know if not improving with this.     Meds ordered this encounter  Medications   betamethasone dipropionate 0.05 % cream    Sig: Apply topically 2 (two) times daily. Apply to affected area twice per day.    Dispense:  45 g    Refill:  0    No follow-ups on file.    This visit occurred during the SARS-CoV-2 public health emergency.  Safety protocols were in place, including screening questions prior to the visit, additional usage of staff PPE, and extensive cleaning of exam room while observing appropriate contact time as indicated for disinfecting solutions.

## 2023-02-21 NOTE — Assessment & Plan Note (Signed)
Atopic vs contact dermatitis.  Responded to betamethasone previously.  Will add this back on.  She will let me know if not improving with this.

## 2023-02-21 NOTE — Patient Instructions (Signed)
Apply betamethasone cream twice per day.  Use for up to 2 weeks.  If not improving with this let me know.

## 2023-02-28 ENCOUNTER — Encounter: Payer: Self-pay | Admitting: Family Medicine

## 2023-02-28 ENCOUNTER — Ambulatory Visit (INDEPENDENT_AMBULATORY_CARE_PROVIDER_SITE_OTHER): Payer: No Typology Code available for payment source | Admitting: Family Medicine

## 2023-02-28 ENCOUNTER — Other Ambulatory Visit: Payer: Self-pay | Admitting: Family Medicine

## 2023-02-28 VITALS — BP 121/67 | HR 78 | Temp 98.4°F | Ht 68.0 in | Wt 177.6 lb

## 2023-02-28 DIAGNOSIS — J989 Respiratory disorder, unspecified: Secondary | ICD-10-CM

## 2023-02-28 DIAGNOSIS — R509 Fever, unspecified: Secondary | ICD-10-CM

## 2023-02-28 HISTORY — DX: Respiratory disorder, unspecified: R50.9

## 2023-02-28 LAB — POCT INFLUENZA A/B
Influenza A, POC: NEGATIVE
Influenza B, POC: NEGATIVE

## 2023-02-28 MED ORDER — PREDNISONE 20 MG PO TABS: 20.0000 mg | ORAL_TABLET | Freq: Two times a day (BID) | ORAL | 0 refills | Status: DC

## 2023-02-28 MED ORDER — DOXYCYCLINE HYCLATE 100 MG PO TABS: 100.0000 mg | ORAL_TABLET | Freq: Two times a day (BID) | ORAL | 0 refills | Status: DC

## 2023-02-28 NOTE — Assessment & Plan Note (Addendum)
Flu negative.  Adding course of doxycycline.  Mild wheezing on exam.  Adding burst of prednisone. Contact clinic if worsening.

## 2023-02-28 NOTE — Progress Notes (Signed)
Kelli [name] - 77 y.o. female MRN [id number]  Date of birth: March 05, 1946  Subjective Chief Complaint  Patient presents with   URI    HPI Kelli [name] is a 77 y.o. female here today with complaint of fever, congestion, fatigue, and cough.  Symptoms started about 3 days ago.  So far she has tried OTC tylenol and ASA for fever and aches.  She has not had difficulty breathing.  Denies wheezing.  COVID test at home was negative.   ROS:  A comprehensive ROS was completed and negative except as noted per HPI  No Known Allergies  Past Medical History:  Diagnosis Date   Anxiety    Arthritis    Borderline diabetic    Colon cancer (HCC)    colon ca dx 02/2009, lung cancer   Depression    Depression with anxiety 09/19/2011   Diabetes mellitus type 2, uncomplicated (HCC) 12/31/2014   Genetic testing 06/06/2022   H/O colon cancer, stage II 09/19/2011   T3N0  Cecum Sept 2010   High cholesterol    Hyperlipidemia 09/19/2011   Lung nodules 09/19/2011   granulomas   Osteoporosis 2022   Respiratory illness with fever 02/28/2023   Urinary urgency     Past Surgical History:  Procedure Laterality Date   APPENDECTOMY     AUGMENTATION MAMMAPLASTY     BREAST LUMPECTOMY WITH RADIOACTIVE SEED LOCALIZATION Right 03/22/2022   Procedure: RIGHT BREAST LUMPECTOMY WITH RADIOACTIVE SEED LOCALIZATION;  Surgeon: Almond Lint, MD;  Location: [facility];  Service: General;  Laterality: Right;   BREAST SURGERY  1978   augmentation   COLON SURGERY     2010   CRYO INTERCOSTAL NERVE BLOCK N/A 10/05/2014   Procedure: CRYO INTERCOSTAL NERVE BLOCK;  Surgeon: Loreli Slot, MD;  Location: [facility] OR;  Service: Thoracic;  Laterality: N/A;   EYE SURGERY     cataracts   LYMPH NODE DISSECTION Right 10/05/2014   Procedure: LYMPH NODE DISSECTION;  Surgeon: Loreli Slot, MD;  Location: [facility] OR;  Service: Thoracic;  Laterality: Right;   ORIF HUMERUS FRACTURE Left 10/01/2018   Procedure: OPEN  REDUCTION INTERNAL FIXATION (ORIF) LEFT PROXIMAL HUMERUS FRACTURE;  Surgeon: Cammy Copa, MD;  Location: [facility] OR;  Service: Orthopedics;  Laterality: Left;   PARTIAL COLECTOMY Right 2010   SEGMENTECOMY Right 10/05/2014   Procedure: right lower lobe SEGMENTECTOMY;  Surgeon: Loreli Slot, MD;  Location: [facility] OR;  Service: Thoracic;  Laterality: Right;   VIDEO ASSISTED THORACOSCOPY (VATS)/WEDGE RESECTION Right 10/05/2014   Procedure: Right VIDEO ASSISTED THORACOSCOPY (VATS) with right lower lobe lung nodule WEDGE RESECTION;  Surgeon: Loreli Slot, MD;  Location: [facility] OR;  Service: Thoracic;  Laterality: Right;    Social History   Socioeconomic History   Marital status: Divorced    Spouse name: Not on file   Number of children: 0   Years of education: 16   Highest education level: Bachelor's degree (e.g., BA, AB, BS)  Occupational History    Comment: Retired.  Tobacco Use   Smoking status: Former    Current packs/day: 0.00    Average packs/day: 3.0 packs/day for 48.7 years (146.0 ttl pk-yrs)    Types: Cigarettes    Start date: 65    Quit date: 02/06/2009    Years since quitting: 14.0   Smokeless tobacco: Never  Vaping Use   Vaping status: Never Used  Substance and Sexual Activity   Alcohol use: Yes    Alcohol/week: 2.0  standard drinks of alcohol    Types: 2 Cans of beer per week    Comment: 1-2 beers every other day   Drug use: Yes    Types: Marijuana    Comment: occasionally   Sexual activity: Not Currently    Birth control/protection: Post-menopausal  Other Topics Concern   Not on file  Social History Narrative   Lives alone with her puppy. She does not have any children. She enjoys reading, stream movies & series and swimming.   Right handed   One story home   Social Determinants of Health   Financial Resource Strain: Low Risk  (11/02/2022)   Overall Financial Resource Strain (CARDIA)    Difficulty of Paying Living Expenses: Not hard at all  Food  Insecurity: No Food Insecurity (11/02/2022)   Hunger Vital Sign    Worried About Running Out of Food in the Last Year: Never true    Ran Out of Food in the Last Year: Never true  Transportation Needs: No Transportation Needs (11/02/2022)   PRAPARE - Administrator, Civil Service (Medical): No    Lack of Transportation (Non-Medical): No  Physical Activity: Insufficiently Active (02/21/2023)   Exercise Vital Sign    Days of Exercise per Week: 1 day    Minutes of Exercise per Session: 20 min  Stress: No Stress Concern Present (11/02/2022)   [employer] of Occupational Health - Occupational Stress Questionnaire    Feeling of Stress : Not at all  Social Connections: Moderately Isolated (11/06/2022)   Social Connection and Isolation Panel [NHANES]    Frequency of Communication with Friends and Family: More than three times a week    Frequency of Social Gatherings with Friends and Family: Once a week    Attends Religious Services: 1 to 4 times per year    Active Member of [employer] or Organizations: No    Attends Banker Meetings: Never    Marital Status: Divorced    Family History  Problem Relation Age of Onset   Heart disease Mother    Colon polyps Sister    Lung cancer Sister    Heart disease Brother    Cancer Brother        unk type   Breast cancer Niece    Breast cancer Niece    Other Niece        gene +, possibly BRCA   Colon cancer Neg Hx    Esophageal cancer Neg Hx    Stomach cancer Neg Hx    Rectal cancer Neg Hx     Health Maintenance  Topic Date Due   OPHTHALMOLOGY EXAM  11/18/2022   COVID-19 Vaccine (7 - 2023-24 season) 04/18/2023   HEMOGLOBIN A1C  07/19/2023   FOOT EXAM  09/14/2023   Diabetic kidney evaluation - eGFR measurement  10/02/2023   Medicare Annual Wellness (AWV)  12/06/2023   Diabetic kidney evaluation - Urine ACR  02/21/2024   DEXA SCAN  12/05/2024   Colonoscopy  01/29/2025   DTaP/Tdap/Td (2 - Td or Tdap) 10/01/2028    Pneumonia Vaccine 50+ Years old  Completed   INFLUENZA VACCINE  Completed   Hepatitis C Screening  Completed   Zoster Vaccines- Shingrix  Completed   HPV VACCINES  Aged Out     ----------------------------------------------------------------------------------------------------------------------------------------------------------------------------------------------------------------- Physical Exam BP 121/67 (BP Location: Left Arm, Patient Position: Sitting, Cuff Size: Normal)   Pulse 78   Temp 98.4 F (36.9 C) (Oral)   Ht 5\' 8"  (1.727 m)  Wt 177 lb 9.6 oz (80.6 kg)   LMP  (LMP Unknown)   SpO2 98%   BMI 27.00 kg/m   Physical Exam Constitutional:      Appearance: Normal appearance.  Eyes:     General: No scleral icterus. Cardiovascular:     Rate and Rhythm: Normal rate and regular rhythm.  Pulmonary:     Effort: Pulmonary effort is normal.     Breath sounds: Normal breath sounds.  Musculoskeletal:     Cervical back: Neck supple.  Neurological:     Mental Status: She is alert.  Psychiatric:        Mood and Affect: Mood normal.        Behavior: Behavior normal.     ------------------------------------------------------------------------------------------------------------------------------------------------------------------------------------------------------------------- Assessment and Plan  Respiratory illness with fever Flu negative.  Adding course of doxycycline.  Mild wheezing on exam.  Adding burst of prednisone. Contact clinic if worsening.     Meds ordered this encounter  Medications   doxycycline (VIBRA-TABS) 100 MG tablet    Sig: Take 1 tablet (100 mg total) by mouth 2 (two) times daily.    Dispense:  20 tablet    Refill:  0   predniSONE (DELTASONE) 20 MG tablet    Sig: Take 1 tablet (20 mg total) by mouth 2 (two) times daily with a meal.    Dispense:  10 tablet    Refill:  0    No follow-ups on file.    This visit occurred during the SARS-CoV-2  public health emergency.  Safety protocols were in place, including screening questions prior to the visit, additional usage of staff PPE, and extensive cleaning of exam room while observing appropriate contact time as indicated for disinfecting solutions.

## 2023-03-01 LAB — RESPIRATORY PANEL W/ SARS-COV2

## 2023-03-01 LAB — SPECIMEN STATUS REPORT

## 2023-03-03 LAB — RESPIRATORY PANEL W/ SARS-COV2

## 2023-03-04 ENCOUNTER — Other Ambulatory Visit: Payer: Self-pay | Admitting: Family Medicine

## 2023-03-04 DIAGNOSIS — M19041 Primary osteoarthritis, right hand: Secondary | ICD-10-CM

## 2023-03-06 ENCOUNTER — Encounter: Payer: Self-pay | Admitting: Family Medicine

## 2023-03-14 NOTE — Telephone Encounter (Addendum)
Pt received Zilretta injection for RIGHT knee OA on 01/22/23.  HOLD Orthovisc until needed by pt

## 2023-04-04 ENCOUNTER — Other Ambulatory Visit: Payer: Self-pay | Admitting: Family Medicine

## 2023-04-06 ENCOUNTER — Ambulatory Visit (HOSPITAL_BASED_OUTPATIENT_CLINIC_OR_DEPARTMENT_OTHER)
Admission: RE | Admit: 2023-04-06 | Discharge: 2023-04-06 | Disposition: A | Discharge status: Home / Self Care | Payer: No Typology Code available for payment source | Source: Ambulatory Visit | Attending: Oncology | Admitting: Oncology

## 2023-04-06 DIAGNOSIS — Z85118 Personal history of other malignant neoplasm of bronchus and lung: Secondary | ICD-10-CM | POA: Insufficient documentation

## 2023-04-06 DIAGNOSIS — J432 Centrilobular emphysema: Secondary | ICD-10-CM | POA: Diagnosis not present

## 2023-04-06 DIAGNOSIS — I700 Atherosclerosis of aorta: Secondary | ICD-10-CM | POA: Diagnosis not present

## 2023-04-06 DIAGNOSIS — J929 Pleural plaque without asbestos: Secondary | ICD-10-CM | POA: Diagnosis not present

## 2023-04-06 DIAGNOSIS — C3490 Malignant neoplasm of unspecified part of unspecified bronchus or lung: Secondary | ICD-10-CM | POA: Diagnosis not present

## 2023-04-06 NOTE — Telephone Encounter (Signed)
VOB initiated for Zilretta for RIGHT knee OA.  

## 2023-04-08 ENCOUNTER — Other Ambulatory Visit: Payer: Self-pay | Admitting: Oncology

## 2023-04-09 NOTE — Telephone Encounter (Signed)
Prior Auth is NOT required for ZILRETTA for RIGHT knee OA

## 2023-04-09 NOTE — Telephone Encounter (Signed)
Holding until needed by patient.

## 2023-04-09 NOTE — Telephone Encounter (Signed)
Zilretta for RIGHT knee OA OK to schedule on or after 04/16/23   Primary Insurance: [employer] Co-pay: $15 Co-insurance: 20% Deductible: does not apply Prior Auth: NOT required     Knee Injection History: 12/14/22 - Kenalog BILAT 01/22/23 - Zilretta RIGHT

## 2023-04-11 ENCOUNTER — Inpatient Hospital Stay: Payer: No Typology Code available for payment source | Attending: Oncology | Admitting: Oncology

## 2023-04-11 VITALS — BP 108/54 | HR 78 | Temp 98.2°F | Resp 18 | Ht 68.0 in | Wt 180.8 lb

## 2023-04-11 DIAGNOSIS — Z85118 Personal history of other malignant neoplasm of bronchus and lung: Secondary | ICD-10-CM | POA: Insufficient documentation

## 2023-04-11 DIAGNOSIS — L989 Disorder of the skin and subcutaneous tissue, unspecified: Secondary | ICD-10-CM | POA: Insufficient documentation

## 2023-04-11 DIAGNOSIS — Z85038 Personal history of other malignant neoplasm of large intestine: Secondary | ICD-10-CM | POA: Diagnosis not present

## 2023-04-11 DIAGNOSIS — R918 Other nonspecific abnormal finding of lung field: Secondary | ICD-10-CM | POA: Diagnosis not present

## 2023-04-11 DIAGNOSIS — Z87891 Personal history of nicotine dependence: Secondary | ICD-10-CM | POA: Diagnosis not present

## 2023-04-11 DIAGNOSIS — Z79811 Long term (current) use of aromatase inhibitors: Secondary | ICD-10-CM | POA: Insufficient documentation

## 2023-04-11 DIAGNOSIS — D0511 Intraductal carcinoma in situ of right breast: Secondary | ICD-10-CM | POA: Diagnosis not present

## 2023-04-11 DIAGNOSIS — Z860100 Personal history of colon polyps, unspecified: Secondary | ICD-10-CM | POA: Insufficient documentation

## 2023-04-11 NOTE — Progress Notes (Signed)
[facility] OFFICE PROGRESS NOTE   Diagnosis: DCIS, history of lung cancer  INTERVAL HISTORY:   Ms. Vanvalkenburgh returns as scheduled.  She feels well.  Good appetite.  No change over either breast.  She complains of multiple "painful "moles at the upper chest/lower neck.  She also has discomfort at the left elbow. She continues anastrozole.  No hot flashes. Objective:  Vital signs in last 24 hours:  Blood pressure (!) 108/54, pulse 78, temperature 98.2 F (36.8 C), temperature source Temporal, resp. rate 18, height 5\' 8"  (1.727 m), weight 180 lb 12.8 oz (82 kg), SpO2 96%.    Lymphatics: No cervical, supraclavicular, or axillary nodes Resp: Decreased breath sounds with inspiratory rhonchi at the right lower posterior chest, no respiratory distress Cardio: Regular rate and rhythm GI: No hepatosplenomegaly Vascular: No leg edema Breast: Bilateral breast implants.  No mass in either breast.  No evidence for recurrent tumor at the right lumpectomy scar Skin: Multiple benign-appearing moles/keratoses over the trunk a  Lab Results:  Lab Results  Component Value Date   WBC 6.9 10/02/2022   HGB 12.5 10/02/2022   HCT 37.9 10/02/2022   MCV 93.8 10/02/2022   PLT 276 10/02/2022   NEUTROABS 3,436 10/02/2022    CMP  Lab Results  Component Value Date   NA 142 10/02/2022   K 4.5 10/02/2022   CL 106 10/02/2022   CO2 30 10/02/2022   GLUCOSE 103 (H) 10/02/2022   BUN 15 10/02/2022   CREATININE 0.71 10/02/2022   CALCIUM 9.3 10/02/2022   PROT 6.4 10/02/2022   ALBUMIN 2.1 (L) 09/20/2020   AST 19 10/02/2022   ALT 20 10/02/2022   ALKPHOS 100 09/20/2020   BILITOT 0.4 10/02/2022   GFRNONAA >60 03/14/2022   GFRAA 87 11/08/2020    Lab Results  Component Value Date   CEA 1.8 08/28/2014     Medications: I have reviewed the patient's current medications.   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 History of colonic polyps-status post a surveillance colonoscopy in February 2016 with a tubular adenoma removed Colonoscopy 01/30/2020-polyps removed from the rectum and descending colon, hyperplastic polyps 3. History of heavy tobacco use, quit in 2010 4. History of lung nodules-last imaging was an abdomen CT in October 2013 prior to repeat imaging 09/03/2014 CT 09/03/2014 revealed enlargement of a spiculated right lower lobe nodule PET scan 09/16/2014 revealed low-level FDG activity associated with the right lower lobe spiculated nodule Status post a right lower lobe basilar segmentectomy and mediastinal lymph node dissection on 10/06/2014 with the pathology confirming a minimally invasive well-differentiated adenocarcinoma,pT1a,N0, invasive component measured less than 0.3 cm, with negative surgical margins CT chest 09/15/2015 with no evidence of recurrent lung cancer, stable nodules except for a probable new 4 mm right lower lobe subpleural nodule CT chest 06/19/2016-no evidence of recurrent lung cancer, stable lung nodules CT chest 03/20/2017-stable lung nodules CT chest 03/26/2018- stable lung nodules CT chest 04/09/2019-stable lung nodules CT chest 04/10/2019-stable lung nodules CT chest 10/25/2020-stable lung nodules.  Resolution of multifocal pulmonary infiltrate and improvement in bronchial wall thickening. CT chest 01/18/2021-stable small solid nodules and groundglass opacities. CT chest 07/23/2021-subpleural focal area of groundglass attenuation within the anterior left upper lobe mildly increased when compared to remote priors, stable when compared to more recent priors.  Additional multiple bilateral pulmonary nodules grossly stable when compared to chest CT exams dating back to 2018. CT  chest 01/19/2022-stable upper lobe groundglass opacity, multiple stable bilateral pulmonary nodules CT chest 04/06/2023-mild progression of clustered groundglass  nodularity in the left upper lobe, additional small solid and groundglass nodules are stable, stable postoperative changes in the right lung   5.  Post thoracotomy pain-resolved   6.  Left humerus fracture following a fall, status post surgical repair 10/01/2018  7.  Breast DCIS Mammogram 02/17/2022-new group of heterogenous calcifications in the posterior upper right breast spanning 2.5 cm Targeted right breast ultrasound 915 2023-1 x 0.5 x 1.7 cm ill-defined hypoechoic area at the 12 o'clock position of the right breast with calcifications no abnormal right axillary lymph nodes Ultrasound-guided right breast biopsy 02/27/2022-media grade DCIS, no invasive carcinoma, necrosis and calcifications present, DCIS length 0.5 cm, ER 95%, PR 50% Seed localized right lumpectomy 03/22/2022, anterior margin skin, posterior margin implant capsule-DCIS, grade 2, 11 x 9 x 8 mm, necrosis present, all margins negative, closest margin anterior-2 mm, superior-3 mm, medial-4 mm, posterior-6 mm, inferior-9 mm, lateral 24 mm.  No lymph nodes submitted Anastrozole beginning after office visit with Dr. Roselind Messier 04/12/2022       Disposition: Ms. Bowlds is in clinical remission from lung cancer, colon cancer, and DCIS.  She continues anastrozole.  I reviewed the chest CT findings and images with her.  She has solid and groundglass lung nodules, generally unchanged.  An area of nodularity in the left upper lobe has increased.  She will return for an office visit and repeat chest CT in 6 months.  We will refer her to dermatology for evaluation and treatment of the skin lesions.  Thornton Papas, MD  04/11/2023  12:03 PM

## 2023-04-17 ENCOUNTER — Ambulatory Visit

## 2023-04-25 ENCOUNTER — Other Ambulatory Visit: Payer: Self-pay

## 2023-04-25 ENCOUNTER — Ambulatory Visit: Payer: No Typology Code available for payment source | Admitting: Family Medicine

## 2023-04-25 VITALS — BP 130/80 | HR 73 | Ht 68.0 in

## 2023-04-25 DIAGNOSIS — M170 Bilateral primary osteoarthritis of knee: Secondary | ICD-10-CM

## 2023-04-25 DIAGNOSIS — G8929 Other chronic pain: Secondary | ICD-10-CM

## 2023-04-25 DIAGNOSIS — M25561 Pain in right knee: Secondary | ICD-10-CM | POA: Diagnosis not present

## 2023-04-25 DIAGNOSIS — M25511 Pain in right shoulder: Secondary | ICD-10-CM

## 2023-04-25 DIAGNOSIS — M25562 Pain in left knee: Secondary | ICD-10-CM | POA: Diagnosis not present

## 2023-04-25 NOTE — Progress Notes (Signed)
Wynema Birch, am serving as a Neurosurgeon for Dr. Clementeen Graham.  Kelli [name] is a 77 y.o. female who presents to [facility] today for cont'd bilat knee and R shoulder pain. Pt was last seen by Dr. Denyse Amass on 01/22/23 and was give a R knee Zilretta injection. Last bilat knee steroid injection was on 12/14/22. Last R GH steroid injection was on 09/06/22.  Today, pt reports patient states that her knees are especially worse since Sunday and her right shoulder but is really wanting knee injections.   Dx imaging: 12/14/22 R & L knee XR  07/26/20 R shoulder MRI             11/21/19 R shoulder XR  Pertinent review of systems: No fevers or chills  Relevant historical information: History of lung cancer and diabetes.   Exam:  BP 130/80   Pulse 73   Ht 5\' 8"  (1.727 m)   LMP  (LMP Unknown)   SpO2 95%   BMI 27.49 kg/m  General: Well Developed, well nourished, and in no acute distress.   MSK: Right shoulder normal-appearing pain with abduction.  Intact strength.  Knees bilaterally mild effusion.   Lab and Radiology Results  Procedure: Real-time Ultrasound Guided Injection of right shoulder glenohumeral joint posterior approach Device: Philips Affiniti 50G/GE Logiq Images permanently stored and available for review in PACS Verbal informed consent obtained.  Discussed risks and benefits of procedure. Warned about infection, bleeding, hyperglycemia damage to structures among others. Patient expresses understanding and agreement Time-out conducted.   Noted no overlying erythema, induration, or other signs of local infection.   Skin prepped in a sterile fashion.   Local anesthesia: Topical Ethyl chloride.   With sterile technique and under real time ultrasound guidance: 40 mg of Kenalog and 2 ml of Marcaine injected into right shoulder joint glenohumeral joint. Fluid seen entering the shoulder joint.   Completed without difficulty   Pain immediately resolved  suggesting accurate placement of the medication.   Advised to call if fevers/chills, erythema, induration, drainage, or persistent bleeding.   Images permanently stored and available for review in the ultrasound unit.  Impression: Technically successful ultrasound guided injection.         Assessment and Plan: 77 y.o. female with right shoulder pain.  This is a acute recurrence of a chronic problem.  Plan for repeat steroid injection.  Previous injection for this was in April 2024.  Additionally she has bilateral knee pain.  She had a right knee Zilretta injection in August which is worked until recently.  She had a left conventional cortisone shot in July and now has returning knee pain.  Would like to proceed with Zilretta injections of both knees soon as possible.  However her left knee is not authorized for [employer].  Plan for authorization left knee Zilretta and proceed with bilateral Zilretta injections as soon as possible.   PDMP not reviewed this encounter. Orders Placed This Encounter  Procedures   [place] LIMITED JOINT SPACE STRUCTURES UP RIGHT(NO LINKED CHARGES)    Standing Status:   Future    Number of Occurrences:   1    Standing Expiration Date:   10/23/2023    Order Specific Question:   Reason for Exam (SYMPTOM  OR DIAGNOSIS REQUIRED)    Answer:   Right shoulder pian    Order Specific Question:   Preferred imaging location?    Answer:   Adult nurse [facility]   No  orders of the defined types were placed in this encounter.    Discussed warning signs or symptoms. Please see discharge instructions. Patient expresses understanding.   The above documentation has been reviewed and is accurate and complete Clementeen Graham, M.D.

## 2023-04-25 NOTE — Patient Instructions (Signed)
Thank you for coming in today.   We will work on authorization for [employer] injection both knees.   I think we will be able to get it approved and done before Thanksgiving.   Let me know if this is not working.

## 2023-04-26 ENCOUNTER — Telehealth: Payer: Self-pay

## 2023-04-26 NOTE — Telephone Encounter (Signed)
Patient is authorized for right and now left zilretta injections, can you schedule patient when we have the appropriate stock. Thank you

## 2023-04-30 ENCOUNTER — Other Ambulatory Visit: Payer: Self-pay | Admitting: Family Medicine

## 2023-04-30 NOTE — Telephone Encounter (Signed)
Scheduled

## 2023-05-07 NOTE — Telephone Encounter (Signed)
Scheduled for Zilretta 05/10/23.

## 2023-05-10 ENCOUNTER — Ambulatory Visit: Payer: No Typology Code available for payment source | Admitting: Family Medicine

## 2023-05-10 ENCOUNTER — Other Ambulatory Visit: Payer: Self-pay

## 2023-05-10 DIAGNOSIS — M170 Bilateral primary osteoarthritis of knee: Secondary | ICD-10-CM

## 2023-05-10 MED ORDER — TRIAMCINOLONE ACETONIDE 32 MG IX SRER
32.0000 mg | Freq: Once | INTRA_ARTICULAR | Status: AC
  Administered 2023-05-10: 32 mg via INTRA_ARTICULAR

## 2023-05-10 NOTE — Progress Notes (Signed)
  Zilretta injection bilateral knee Procedure: Real-time Ultrasound Guided Injection of right knee joint superior lateral patellar space Device: Philips Affiniti 50G Images permanently stored and available for review in PACS Verbal informed consent obtained.  Discussed risks and benefits of procedure. Warned about infection, hyperglycemia bleeding, damage to structures among others. Patient expresses understanding and agreement Time-out conducted.   Noted no overlying erythema, induration, or other signs of local infection.   Skin prepped in a sterile fashion.   Local anesthesia: Topical Ethyl chloride.   With sterile technique and under real time ultrasound guidance: Zilretta 32 mg injected into knee joint. Fluid seen entering the joint capsule.   Completed without difficulty   Advised to call if fevers/chills, erythema, induration, drainage, or persistent bleeding.   Images permanently stored and available for review in the ultrasound unit.  Impression: Technically successful ultrasound guided injection.   Procedure: Real-time Ultrasound Guided Injection of left knee joint superior lateral patellar space Device: Philips Affiniti 50G Images permanently stored and available for review in PACS Verbal informed consent obtained.  Discussed risks and benefits of procedure. Warned about infection, hyperglycemia bleeding, damage to structures among others. Patient expresses understanding and agreement Time-out conducted.   Noted no overlying erythema, induration, or other signs of local infection.   Skin prepped in a sterile fashion.   Local anesthesia: Topical Ethyl chloride.   With sterile technique and under real time ultrasound guidance: Zilretta 32 mg injected into knee joint. Fluid seen entering the joint capsule.   Completed without difficulty   Advised to call if fevers/chills, erythema, induration, drainage, or persistent bleeding.   Images permanently stored and available for review  in the ultrasound unit.  Impression: Technically successful ultrasound guided injection.  Lot number: [id number]

## 2023-05-10 NOTE — Patient Instructions (Signed)
Thank you for coming in today.   Call or go to the ER if you develop a large red swollen joint with extreme pain or oozing puss.   

## 2023-05-11 ENCOUNTER — Encounter: Payer: Self-pay | Admitting: Family Medicine

## 2023-05-18 ENCOUNTER — Other Ambulatory Visit: Payer: Self-pay | Admitting: Family Medicine

## 2023-05-21 ENCOUNTER — Encounter: Payer: Self-pay | Admitting: Family Medicine

## 2023-05-21 ENCOUNTER — Ambulatory Visit (INDEPENDENT_AMBULATORY_CARE_PROVIDER_SITE_OTHER): Payer: No Typology Code available for payment source | Admitting: Family Medicine

## 2023-05-21 VITALS — BP 136/75 | HR 75 | Temp 98.1°F | Ht 68.0 in | Wt 173.1 lb

## 2023-05-21 DIAGNOSIS — Z794 Long term (current) use of insulin: Secondary | ICD-10-CM

## 2023-05-21 DIAGNOSIS — L989 Disorder of the skin and subcutaneous tissue, unspecified: Secondary | ICD-10-CM

## 2023-05-21 DIAGNOSIS — E785 Hyperlipidemia, unspecified: Secondary | ICD-10-CM | POA: Diagnosis not present

## 2023-05-21 DIAGNOSIS — E119 Type 2 diabetes mellitus without complications: Secondary | ICD-10-CM | POA: Diagnosis not present

## 2023-05-21 DIAGNOSIS — D485 Neoplasm of uncertain behavior of skin: Secondary | ICD-10-CM

## 2023-05-21 LAB — POCT GLYCOSYLATED HEMOGLOBIN (HGB A1C): Hemoglobin A1C: 8.5 % — AB (ref 4.0–5.6)

## 2023-05-21 NOTE — Assessment & Plan Note (Signed)
Tolerating pravastatin well at current strength.  Will plan to continue.

## 2023-05-21 NOTE — Assessment & Plan Note (Signed)
Referral to dermatology.  

## 2023-05-21 NOTE — Assessment & Plan Note (Signed)
Worsened some since last visit.  She has had multiple steroid injections since her last visit which has caused elevation in blood sugars.  Encouraged dietary changes.  Recheck in 3 months.

## 2023-05-21 NOTE — Progress Notes (Signed)
Kelli [name] - 77 y.o. female MRN [id number]  Date of birth: July 07, 1945  Subjective Chief Complaint  Patient presents with   Medical Management of Chronic Issues    HPI Kelli [name] is a 77 y.o. female here today for follow up.   She reports that she is doing pretty well.   She continues on clonidine for management of HTN.  This is working pretty well for her.  Denies problems with tolerance of this at curren strength.    Diabetes is managed with basaglar. Insurance changing around the first of the year and basaglar is not on formulary.   She denies symptoms of hypoglycemia.  No changes to diet or activity levels recently.  Tolearing pravastatin well at current strength for associated HLD.   Mood remains stable with fluoxetine.    ROS:  A comprehensive ROS was completed and negative except as noted per HPI  No Known Allergies  Past Medical History:  Diagnosis Date   Anxiety    Arthritis    Borderline diabetic    Colon cancer (HCC)    colon ca dx 02/2009, lung cancer   Depression    Depression with anxiety 09/19/2011   Diabetes mellitus type 2, uncomplicated (HCC) 12/31/2014   Genetic testing 06/06/2022   H/O colon cancer, stage II 09/19/2011   T3N0  Cecum Sept 2010   High cholesterol    Hyperlipidemia 09/19/2011   Lung nodules 09/19/2011   granulomas   Osteoporosis 2022   Respiratory illness with fever 02/28/2023   Urinary urgency     Past Surgical History:  Procedure Laterality Date   APPENDECTOMY     AUGMENTATION MAMMAPLASTY     BREAST LUMPECTOMY WITH RADIOACTIVE SEED LOCALIZATION Right 03/22/2022   Procedure: RIGHT BREAST LUMPECTOMY WITH RADIOACTIVE SEED LOCALIZATION;  Surgeon: Almond Lint, MD;  Location: [facility];  Service: General;  Laterality: Right;   BREAST SURGERY  1978   augmentation   COLON SURGERY     2010   CRYO INTERCOSTAL NERVE BLOCK N/A 10/05/2014   Procedure: CRYO INTERCOSTAL NERVE BLOCK;  Surgeon: Loreli Slot, MD;   Location: [facility] OR;  Service: Thoracic;  Laterality: N/A;   EYE SURGERY     cataracts   LYMPH NODE DISSECTION Right 10/05/2014   Procedure: LYMPH NODE DISSECTION;  Surgeon: Loreli Slot, MD;  Location: [facility] OR;  Service: Thoracic;  Laterality: Right;   ORIF HUMERUS FRACTURE Left 10/01/2018   Procedure: OPEN REDUCTION INTERNAL FIXATION (ORIF) LEFT PROXIMAL HUMERUS FRACTURE;  Surgeon: Cammy Copa, MD;  Location: [facility] OR;  Service: Orthopedics;  Laterality: Left;   PARTIAL COLECTOMY Right 2010   SEGMENTECOMY Right 10/05/2014   Procedure: right lower lobe SEGMENTECTOMY;  Surgeon: Loreli Slot, MD;  Location: [facility] OR;  Service: Thoracic;  Laterality: Right;   VIDEO ASSISTED THORACOSCOPY (VATS)/WEDGE RESECTION Right 10/05/2014   Procedure: Right VIDEO ASSISTED THORACOSCOPY (VATS) with right lower lobe lung nodule WEDGE RESECTION;  Surgeon: Loreli Slot, MD;  Location: [facility] OR;  Service: Thoracic;  Laterality: Right;    Social History   Socioeconomic History   Marital status: Divorced    Spouse name: Not on file   Number of children: 0   Years of education: 16   Highest education level: Bachelor's degree (e.g., BA, AB, BS)  Occupational History    Comment: Retired.  Tobacco Use   Smoking status: Former    Current packs/day: 0.00    Average packs/day: 3.0 packs/day for 48.7 years (146.0  ttl pk-yrs)    Types: Cigarettes    Start date: 35    Quit date: 02/06/2009    Years since quitting: 14.2   Smokeless tobacco: Never  Vaping Use   Vaping status: Never Used  Substance and Sexual Activity   Alcohol use: Yes    Alcohol/week: 2.0 standard drinks of alcohol    Types: 2 Cans of beer per week    Comment: 1-2 beers every other day   Drug use: Yes    Types: Marijuana    Comment: occasionally   Sexual activity: Not Currently    Birth control/protection: Post-menopausal  Other Topics Concern   Not on file  Social History Narrative   Lives alone with her puppy. She  does not have any children. She enjoys reading, stream movies & series and swimming.   Right handed   One story home   Social Drivers of Health   Financial Resource Strain: Low Risk  (05/17/2023)   Overall Financial Resource Strain (CARDIA)    Difficulty of Paying Living Expenses: Not very hard  Food Insecurity: No Food Insecurity (05/17/2023)   Hunger Vital Sign    Worried About Running Out of Food in the Last Year: Never true    Ran Out of Food in the Last Year: Never true  Transportation Needs: No Transportation Needs (05/17/2023)   PRAPARE - Administrator, Civil Service (Medical): No    Lack of Transportation (Non-Medical): No  Physical Activity: Insufficiently Active (05/17/2023)   Exercise Vital Sign    Days of Exercise per Week: 1 day    Minutes of Exercise per Session: 20 min  Stress: No Stress Concern Present (05/17/2023)   [employer] of Occupational Health - Occupational Stress Questionnaire    Feeling of Stress : Not at all  Social Connections: Moderately Isolated (05/17/2023)   Social Connection and Isolation Panel [NHANES]    Frequency of Communication with Friends and Family: More than three times a week    Frequency of Social Gatherings with Friends and Family: Once a week    Attends Religious Services: 1 to 4 times per year    Active Member of [employer] or Organizations: No    Attends Banker Meetings: Never    Marital Status: Divorced    Family History  Problem Relation Age of Onset   Heart disease Mother    Colon polyps Sister    Lung cancer Sister    Heart disease Brother    Cancer Brother        unk type   Breast cancer Niece    Breast cancer Niece    Other Niece        gene +, possibly BRCA   Colon cancer Neg Hx    Esophageal cancer Neg Hx    Stomach cancer Neg Hx    Rectal cancer Neg Hx     Health Maintenance  Topic Date Due   OPHTHALMOLOGY EXAM  11/18/2022   COVID-19 Vaccine (7 - 2024-25 season) 06/06/2023  (Originally 04/18/2023)   FOOT EXAM  09/14/2023   Diabetic kidney evaluation - eGFR measurement  10/02/2023   HEMOGLOBIN A1C  11/19/2023   Medicare Annual Wellness (AWV)  12/06/2023   Diabetic kidney evaluation - Urine ACR  02/21/2024   DEXA SCAN  12/05/2024   Colonoscopy  01/29/2025   DTaP/Tdap/Td (2 - Td or Tdap) 10/01/2028   Pneumonia Vaccine 32+ Years old  Completed   INFLUENZA VACCINE  Completed   Hepatitis C  Screening  Completed   Zoster Vaccines- Shingrix  Completed   HPV VACCINES  Aged Out     ----------------------------------------------------------------------------------------------------------------------------------------------------------------------------------------------------------------- Physical Exam BP 136/75 (BP Location: Left Arm, Patient Position: Sitting, Cuff Size: Normal)   Pulse 75   Temp 98.1 F (36.7 C) (Oral)   Ht 5\' 8"  (1.727 m)   Wt 173 lb 1.9 oz (78.5 kg)   LMP  (LMP Unknown)   SpO2 99%   BMI 26.32 kg/m   Physical Exam Constitutional:      Appearance: Normal appearance.  Cardiovascular:     Rate and Rhythm: Normal rate and regular rhythm.  Pulmonary:     Effort: Pulmonary effort is normal.     Breath sounds: Normal breath sounds.  Neurological:     General: No focal deficit present.     Mental Status: She is alert.  Psychiatric:        Mood and Affect: Mood normal.        Behavior: Behavior normal.     ------------------------------------------------------------------------------------------------------------------------------------------------------------------------------------------------------------------- Assessment and Plan  Diabetes mellitus type 2, uncomplicated (HCC) Worsened some since last visit.  She has had multiple steroid injections since her last visit which has caused elevation in blood sugars.  Encouraged dietary changes.  Recheck in 3 months.    Hyperlipidemia Tolerating pravastatin well at current strength.   Will plan to continue.  Skin lesion Referral to dermatology.    No orders of the defined types were placed in this encounter.   Return in about 3 months (around 08/19/2023) for Type 2 Diabetes.    This visit occurred during the SARS-CoV-2 public health emergency.  Safety protocols were in place, including screening questions prior to the visit, additional usage of staff PPE, and extensive cleaning of exam room while observing appropriate contact time as indicated for disinfecting solutions.

## 2023-05-25 ENCOUNTER — Encounter: Payer: Self-pay | Admitting: Family Medicine

## 2023-05-25 NOTE — Telephone Encounter (Signed)
 Care team updated and letter sent for eye exam notes.

## 2023-05-28 ENCOUNTER — Other Ambulatory Visit: Payer: Self-pay | Admitting: Family Medicine

## 2023-05-28 NOTE — Telephone Encounter (Signed)
Last Zilretta injections 05/10/23  Can consider repeat injections on or after 08/03/23

## 2023-05-31 ENCOUNTER — Ambulatory Visit: Payer: No Typology Code available for payment source | Admitting: Family Medicine

## 2023-05-31 ENCOUNTER — Encounter: Payer: Self-pay | Admitting: Family Medicine

## 2023-05-31 VITALS — BP 132/78 | HR 84 | Ht 68.0 in | Wt 176.0 lb

## 2023-05-31 DIAGNOSIS — S29012A Strain of muscle and tendon of back wall of thorax, initial encounter: Secondary | ICD-10-CM

## 2023-05-31 MED ORDER — HYDROCODONE-ACETAMINOPHEN 5-325 MG PO TABS: 1.0000 | ORAL_TABLET | Freq: Four times a day (QID) | ORAL | 0 refills | Status: DC | PRN

## 2023-05-31 MED ORDER — TIZANIDINE HCL 4 MG PO TABS: 4.0000 mg | ORAL_TABLET | Freq: Four times a day (QID) | ORAL | 0 refills | Status: DC | PRN

## 2023-05-31 MED ORDER — MELOXICAM 7.5 MG PO TABS: 7.5000 mg | ORAL_TABLET | Freq: Two times a day (BID) | ORAL | 1 refills | Status: DC | PRN

## 2023-05-31 MED ORDER — KETOROLAC TROMETHAMINE 30 MG/ML IJ SOLN
30.0000 mg | Freq: Once | INTRAMUSCULAR | Status: AC
  Administered 2023-05-31: 30 mg via INTRAMUSCULAR

## 2023-05-31 NOTE — Progress Notes (Signed)
Kelli [name] - 77 y.o. female MRN [id number]  Date of birth: [birth year]  Subjective Chief Complaint  Patient presents with   Back Pain    HPI Kelli [name] is a 77 y.o. female here today with complaint of back pain.  Pain located in mid thoracic.  Denies trauma or recent overuse.  Pain is worse with standing or with movement.  She hasn't really tried anything so far for treatment.  She denies radiation into the lower extremities.   No weakness or numbness.   ROS:  A comprehensive ROS was completed and negative except as noted per HPI  No Known Allergies  Past Medical History:  Diagnosis Date   Anxiety    Arthritis    Borderline diabetic    Colon cancer (HCC)    colon ca dx 02/2009, lung cancer   Depression    Depression with anxiety 09/19/2011   Diabetes mellitus type 2, uncomplicated (HCC) 12/31/2014   Genetic testing 06/06/2022   H/O colon cancer, stage II 09/19/2011   T3N0  Cecum Sept 2010   High cholesterol    Hyperlipidemia 09/19/2011   Lung nodules 09/19/2011   granulomas   Osteoporosis 2022   Respiratory illness with fever 02/28/2023   Urinary urgency     Past Surgical History:  Procedure Laterality Date   APPENDECTOMY     AUGMENTATION MAMMAPLASTY     BREAST LUMPECTOMY WITH RADIOACTIVE SEED LOCALIZATION Right 03/22/2022   Procedure: RIGHT BREAST LUMPECTOMY WITH RADIOACTIVE SEED LOCALIZATION;  Surgeon: Almond Lint, MD;  Location: [facility];  Service: General;  Laterality: Right;   BREAST SURGERY  1978   augmentation   COLON SURGERY     2010   CRYO INTERCOSTAL NERVE BLOCK N/A 10/05/2014   Procedure: CRYO INTERCOSTAL NERVE BLOCK;  Surgeon: Loreli Slot, MD;  Location: [facility] OR;  Service: Thoracic;  Laterality: N/A;   EYE SURGERY     cataracts   LYMPH NODE DISSECTION Right 10/05/2014   Procedure: LYMPH NODE DISSECTION;  Surgeon: Loreli Slot, MD;  Location: [facility] OR;  Service: Thoracic;  Laterality: Right;   ORIF HUMERUS FRACTURE Left  10/01/2018   Procedure: OPEN REDUCTION INTERNAL FIXATION (ORIF) LEFT PROXIMAL HUMERUS FRACTURE;  Surgeon: Cammy Copa, MD;  Location: [facility] OR;  Service: Orthopedics;  Laterality: Left;   PARTIAL COLECTOMY Right 2010   SEGMENTECOMY Right 10/05/2014   Procedure: right lower lobe SEGMENTECTOMY;  Surgeon: Loreli Slot, MD;  Location: [facility] OR;  Service: Thoracic;  Laterality: Right;   VIDEO ASSISTED THORACOSCOPY (VATS)/WEDGE RESECTION Right 10/05/2014   Procedure: Right VIDEO ASSISTED THORACOSCOPY (VATS) with right lower lobe lung nodule WEDGE RESECTION;  Surgeon: Loreli Slot, MD;  Location: [facility] OR;  Service: Thoracic;  Laterality: Right;    Social History   Socioeconomic History   Marital status: Divorced    Spouse name: Not on file   Number of children: 0   Years of education: 16   Highest education level: Bachelor's degree (e.g., BA, AB, BS)  Occupational History    Comment: Retired.  Tobacco Use   Smoking status: Former    Current packs/day: 0.00    Average packs/day: 3.0 packs/day for 48.7 years (146.0 ttl pk-yrs)    Types: Cigarettes    Start date: 65    Quit date: 02/06/2009    Years since quitting: 14.3   Smokeless tobacco: Never  Vaping Use   Vaping status: Never Used  Substance and Sexual Activity   Alcohol use: Yes  Alcohol/week: 2.0 standard drinks of alcohol    Types: 2 Cans of beer per week    Comment: 1-2 beers every other day   Drug use: Yes    Types: Marijuana    Comment: occasionally   Sexual activity: Not Currently    Birth control/protection: Post-menopausal  Other Topics Concern   Not on file  Social History Narrative   Lives alone with her puppy. She does not have any children. She enjoys reading, stream movies & series and swimming.   Right handed   One story home   Social Drivers of Health   Financial Resource Strain: Low Risk  (05/17/2023)   Overall Financial Resource Strain (CARDIA)    Difficulty of Paying Living Expenses:  Not very hard  Food Insecurity: No Food Insecurity (05/17/2023)   Hunger Vital Sign    Worried About Running Out of Food in the Last Year: Never true    Ran Out of Food in the Last Year: Never true  Transportation Needs: No Transportation Needs (05/17/2023)   PRAPARE - Administrator, Civil Service (Medical): No    Lack of Transportation (Non-Medical): No  Physical Activity: Insufficiently Active (05/17/2023)   Exercise Vital Sign    Days of Exercise per Week: 1 day    Minutes of Exercise per Session: 20 min  Stress: No Stress Concern Present (05/17/2023)   [employer] of Occupational Health - Occupational Stress Questionnaire    Feeling of Stress : Not at all  Social Connections: Moderately Isolated (05/17/2023)   Social Connection and Isolation Panel [NHANES]    Frequency of Communication with Friends and Family: More than three times a week    Frequency of Social Gatherings with Friends and Family: Once a week    Attends Religious Services: 1 to 4 times per year    Active Member of [employer] or Organizations: No    Attends Banker Meetings: Never    Marital Status: Divorced    Family History  Problem Relation Age of Onset   Heart disease Mother    Colon polyps Sister    Lung cancer Sister    Heart disease Brother    Cancer Brother        unk type   Breast cancer Niece    Breast cancer Niece    Other Niece        gene +, possibly BRCA   Colon cancer Neg Hx    Esophageal cancer Neg Hx    Stomach cancer Neg Hx    Rectal cancer Neg Hx     Health Maintenance  Topic Date Due   COVID-19 Vaccine (7 - 2024-25 season) 06/06/2023 (Originally 04/18/2023)   OPHTHALMOLOGY EXAM  07/20/2023   FOOT EXAM  09/14/2023   Diabetic kidney evaluation - eGFR measurement  10/02/2023   HEMOGLOBIN A1C  11/19/2023   Medicare Annual Wellness (AWV)  12/06/2023   Diabetic kidney evaluation - Urine ACR  02/21/2024   DEXA SCAN  12/05/2024   Colonoscopy  01/29/2025    DTaP/Tdap/Td (2 - Td or Tdap) 10/01/2028   Pneumonia Vaccine 69+ Years old  Completed   INFLUENZA VACCINE  Completed   Hepatitis C Screening  Completed   Zoster Vaccines- Shingrix  Completed   HPV VACCINES  Aged Out     ----------------------------------------------------------------------------------------------------------------------------------------------------------------------------------------------------------------- Physical Exam BP 132/78 (BP Location: Left Arm, Cuff Size: Normal)   Pulse 84   Ht 5\' 8"  (1.727 m)   Wt 176 lb (79.8 kg)  LMP  (LMP Unknown)   SpO2 98%   BMI 26.76 kg/m   Physical Exam Constitutional:      Appearance: Normal appearance.  Musculoskeletal:     Comments: Spasm of thoracic paraspinals on the R.  Mild pain with flexion.  Increase pain with extension/hyperextension.    Neurological:     Mental Status: She is alert.     ------------------------------------------------------------------------------------------------------------------------------------------------------------------------------------------------------------------- Assessment and Plan  Strain of thoracic back region Recommend heat to area.  Toradol 30mg  given in clinic today.  Meloxicam 7.5mg  1-2x daily as needed.  Tizanidine q6-8 hours as needed.  Norco for sparing use PRN.  Red flags reviewed.    Meds ordered this encounter  Medications   meloxicam (MOBIC) 7.5 MG tablet    Sig: Take 1 tablet (7.5 mg total) by mouth every 12 (twelve) hours as needed for pain.    Dispense:  30 tablet    Refill:  1   tiZANidine (ZANAFLEX) 4 MG tablet    Sig: Take 1 tablet (4 mg total) by mouth every 6 (six) hours as needed for muscle spasms.    Dispense:  30 tablet    Refill:  0   HYDROcodone-acetaminophen (NORCO) 5-325 MG tablet    Sig: Take 1 tablet by mouth every 6 (six) hours as needed for severe pain (pain score 7-10).    Dispense:  10 tablet    Refill:  0    No follow-ups on  file.    This visit occurred during the SARS-CoV-2 public health emergency.  Safety protocols were in place, including screening questions prior to the visit, additional usage of staff PPE, and extensive cleaning of exam room while observing appropriate contact time as indicated for disinfecting solutions.

## 2023-05-31 NOTE — Patient Instructions (Addendum)
Try meloxicam every 12 hours as needed.  Tizanidine every 6-8 hours as needed. Hyrocodone only if having severe pain.  If pain is not improving over the next few days let me know

## 2023-05-31 NOTE — Addendum Note (Signed)
Addended by: Ardyth Man on: 05/31/2023 03:12 PM   Modules accepted: Orders

## 2023-05-31 NOTE — Assessment & Plan Note (Signed)
Recommend heat to area.  Toradol 30mg  given in clinic today.  Meloxicam 7.5mg  1-2x daily as needed.  Tizanidine q6-8 hours as needed.  Norco for sparing use PRN.  Red flags reviewed.

## 2023-06-08 ENCOUNTER — Ambulatory Visit (INDEPENDENT_AMBULATORY_CARE_PROVIDER_SITE_OTHER): Payer: HMO | Admitting: Podiatry

## 2023-06-08 ENCOUNTER — Other Ambulatory Visit: Payer: Self-pay | Admitting: Family Medicine

## 2023-06-08 DIAGNOSIS — E119 Type 2 diabetes mellitus without complications: Secondary | ICD-10-CM | POA: Diagnosis not present

## 2023-06-08 DIAGNOSIS — L84 Corns and callosities: Secondary | ICD-10-CM | POA: Diagnosis not present

## 2023-06-08 NOTE — Progress Notes (Signed)
  Subjective:  Patient ID: Kelli [name], female    DOB: 1945/12/03,   MRN: [id number]  Chief Complaint  Patient presents with   Callouses    Pt presents for a painful corn on the right foot.    78 y.o. female presents for concern of a painful corn on the right foot that has been present for many years.  Requestin nail trim as well. Requesting to have it trimmed. Although it is doing much better. Patient relates she is diabetic and her last A1c was  8.5 . Denies any other pedal complaints. Denies n/v/f/c.   PCP: Velma Ku DO Last seen 10/03/21  Past Medical History:  Diagnosis Date   Anxiety    Arthritis    Borderline diabetic    Colon cancer (HCC)    colon ca dx 02/2009, lung cancer   Depression    Depression with anxiety 09/19/2011   Diabetes mellitus type 2, uncomplicated (HCC) 12/31/2014   Genetic testing 06/06/2022   H/O colon cancer, stage II 09/19/2011   T3N0  Cecum Sept 2010   High cholesterol    Hyperlipidemia 09/19/2011   Lung nodules 09/19/2011   granulomas   Osteoporosis 2022   Respiratory illness with fever 02/28/2023   Urinary urgency     Objective:  Physical Exam: Vascular: DP/PT pulses 2/4 bilateral. CFT <3 seconds. Normal hair growth on digits. No edema.  Skin. No lacerations or abrasions bilateral feet. Hyperkeratotic lesion noted to medial fifth digit on the right.  Musculoskeletal: MMT 5/5 bilateral lower extremities in DF, PF, Inversion and Eversion. Deceased ROM in DF of ankle joint. Adductovarus noted to right fifth digit.  Neurological: Sensation intact to light touch.   Assessment:   1. Corns and callosities   2. Type 2 diabetes mellitus without complication, without long-term current use of insulin  (HCC)         Plan:  Patient was evaluated and treated and all questions answered. -Discussed corns and calluses with patient and treatment options.  -Hyperkeratotic tissue was debrided with chisel without incident.   -Encouraged daily  moisturizing -Discussed use of pumice stone -Advised good supportive shoes and inserts -Discussed and educated patient on diabetic foot care, especially with  regards to the vascular, neurological and musculoskeletal systems.  -Stressed the importance of good glycemic control and the detriment of not  controlling glucose levels in relation to the foot. -Discussed supportive shoes at all times and checking feet regularly.  -Answered all patient questions -Patient to return in 6 month for diabetic foot check.     Asberry Failing, DPM

## 2023-06-14 DIAGNOSIS — L578 Other skin changes due to chronic exposure to nonionizing radiation: Secondary | ICD-10-CM | POA: Diagnosis not present

## 2023-06-14 DIAGNOSIS — L820 Inflamed seborrheic keratosis: Secondary | ICD-10-CM | POA: Diagnosis not present

## 2023-06-14 DIAGNOSIS — L814 Other melanin hyperpigmentation: Secondary | ICD-10-CM | POA: Diagnosis not present

## 2023-06-15 ENCOUNTER — Other Ambulatory Visit: Payer: Self-pay | Admitting: Family Medicine

## 2023-06-18 ENCOUNTER — Ambulatory Visit: Payer: No Typology Code available for payment source | Admitting: Family Medicine

## 2023-06-19 ENCOUNTER — Ambulatory Visit: Payer: HMO | Admitting: Family Medicine

## 2023-06-19 ENCOUNTER — Other Ambulatory Visit: Payer: Self-pay

## 2023-06-19 ENCOUNTER — Ambulatory Visit (INDEPENDENT_AMBULATORY_CARE_PROVIDER_SITE_OTHER): Payer: HMO

## 2023-06-19 VITALS — BP 126/72 | HR 74 | Ht 68.0 in | Wt 180.0 lb

## 2023-06-19 DIAGNOSIS — G8929 Other chronic pain: Secondary | ICD-10-CM

## 2023-06-19 DIAGNOSIS — M25512 Pain in left shoulder: Secondary | ICD-10-CM

## 2023-06-19 DIAGNOSIS — M549 Dorsalgia, unspecified: Secondary | ICD-10-CM | POA: Diagnosis not present

## 2023-06-19 DIAGNOSIS — S22000A Wedge compression fracture of unspecified thoracic vertebra, initial encounter for closed fracture: Secondary | ICD-10-CM | POA: Diagnosis not present

## 2023-06-19 DIAGNOSIS — S42202D Unspecified fracture of upper end of left humerus, subsequent encounter for fracture with routine healing: Secondary | ICD-10-CM | POA: Diagnosis not present

## 2023-06-19 DIAGNOSIS — M19012 Primary osteoarthritis, left shoulder: Secondary | ICD-10-CM | POA: Diagnosis not present

## 2023-06-19 DIAGNOSIS — M419 Scoliosis, unspecified: Secondary | ICD-10-CM | POA: Diagnosis not present

## 2023-06-19 NOTE — Progress Notes (Signed)
 [name], PhD, LAT, ATC acting as a scribe for Artist Lloyd, MD.  Kelli [name] is a 78 y.o. female who presents to [facility] today for L shoulder pain. Pt was last seen by Dr. Lloyd on 05/10/23 for bilat knee pain. She has been seen previously for her R shoulder.  Today, pt c/o L shoulder pain ongoing since the end of Dec. She notes a flare of her back pain that then moved up into the periscapular region. She locates pain to the midline of there thoracic spine, around the bra-strap line. L shoulder pain is all over the joint. She is traveling, leaving Saturday.  Radiates: no Aggravates: trunk rotation Treatments tried: heat, massage, tizanidine , norco, meloxicam   Pertinent review of systems: No fevers or chills  Relevant historical information: History of lung cancer   Exam:  BP 126/72   Pulse 74   Ht 5' 8 (1.727 m)   Wt 180 lb (81.6 kg)   LMP  (LMP Unknown)   SpO2 98%   BMI 27.37 kg/m  General: Well Developed, well nourished, and in no acute distress.   MSK: C-spine tender palpation midline T-spine.  Thoracic motion.  Left shoulder normal-appearing nontender decreased motion.    Lab and Radiology Results No results found for this or any previous visit (from the past 72 hours). DG Thoracic Spine 2 View Result Date: 06/19/2023 CLINICAL DATA:  Midline upper back pain. EXAM: THORACIC SPINE 2 VIEWS COMPARISON:  None Available. FINDINGS: Minimal loss of vertebral body height of 2 lower thoracic vertebral bodies, probably T9 and T10, favor chronic change. Mild scoliosis. Minimal anterior spurring noted in the mid to lower thoracic spine. No other significant bone abnormalities are identified. IMPRESSION: Minimal loss of vertebral body height of 2 lower thoracic vertebral bodies, probably T9 and T10, favor chronic change. Electronically Signed   By: Craig Farr M.D.   On: 06/19/2023 14:26  I, Artist Lloyd, personally (independently)  visualized and performed the interpretation of the images attached in this note.  X-ray images left shoulder obtained today personally and independently interpreted ORIF plate overlying the lateral proximal humerus without hardware failure.  Mild arthritis present glenohumeral joint.  No acute fractures are visible. Await formal radiology review  Procedure: Real-time Ultrasound Guided Injection of left shoulder glenohumeral joint posterior approach Device: Philips Affiniti 50G/GE Logiq Images permanently stored and available for review in PACS Verbal informed consent obtained.  Discussed risks and benefits of procedure. Warned about infection, bleeding, hyperglycemia damage to structures among others. Patient expresses understanding and agreement Time-out conducted.   Noted no overlying erythema, induration, or other signs of local infection.   Skin prepped in a sterile fashion.   Local anesthesia: Topical Ethyl chloride.   With sterile technique and under real time ultrasound guidance: 40 mg of Kenalog  and 2 ml of Marcaine  injected into glenohumeral joint. Fluid seen entering the joint capsule.   Completed without difficulty   Pain immediately resolved suggesting accurate placement of the medication.   Advised to call if fevers/chills, erythema, induration, drainage, or persistent bleeding.   Images permanently stored and available for review in the ultrasound unit.  Impression: Technically successful ultrasound guided injection.      Assessment and Plan: 78 y.o. female with chronic left shoulder pain due to exacerbation of DJD.  Plan for steroid injection today.  Midline back pain.  X-ray today is concerning for possible vertebral compression fracture.  Plan for advanced imaging to be sure  and for potential kyphoplasty planning.    PDMP not reviewed this encounter. Orders Placed This Encounter  Procedures   US  LIMITED JOINT SPACE STRUCTURES UP LEFT(NO LINKED CHARGES)    Reason  for Exam (SYMPTOM  OR DIAGNOSIS REQUIRED):   left shoulder pain    Preferred imaging location?:   [facility] Shoulder Left    Standing Status:   Future    Number of Occurrences:   1    Expiration Date:   06/18/2024    Reason for Exam (SYMPTOM  OR DIAGNOSIS REQUIRED):   eval shoulder pain    Preferred imaging location?:   [facility]   DG Thoracic Spine 2 View    Standing Status:   Future    Number of Occurrences:   1    Expiration Date:   06/18/2024    Reason for Exam (SYMPTOM  OR DIAGNOSIS REQUIRED):   eval pain midline    Preferred imaging location?:   [facility]   No orders of the defined types were placed in this encounter.    Discussed warning signs or symptoms. Please see discharge instructions. Patient expresses understanding.   The above documentation has been reviewed and is accurate and complete Artist Lloyd, M.D.

## 2023-06-19 NOTE — Progress Notes (Signed)
 Radiology thinks you possibly you have a compression fracture at T9 or T10.  This is about where you hurt.  I have ordered an MRI to look more accurately at this and know for sure if you had a fracture or not. Okay to schedule with a MRI for when you get back.

## 2023-06-19 NOTE — Patient Instructions (Signed)
 Thank you for coming in today.   Please get an Xray today before you leave   Call or go to the ER if you develop a large red swollen joint with extreme pain or oozing puss.    Let me know how this goes.

## 2023-06-21 ENCOUNTER — Ambulatory Visit: Payer: No Typology Code available for payment source | Admitting: Family Medicine

## 2023-06-22 ENCOUNTER — Telehealth: Payer: Self-pay | Admitting: *Deleted

## 2023-06-22 NOTE — Telephone Encounter (Signed)
Confirmed with patient that she did see dermatology.

## 2023-06-26 ENCOUNTER — Encounter: Payer: Self-pay | Admitting: Family Medicine

## 2023-06-26 NOTE — Progress Notes (Signed)
Left shoulder x-ray shows where it was broken and has been fixed with surgery.  You do have some arthritis in the main shoulder joint.

## 2023-07-01 ENCOUNTER — Encounter: Payer: Self-pay | Admitting: Family Medicine

## 2023-07-03 ENCOUNTER — Ambulatory Visit: Payer: HMO

## 2023-07-03 DIAGNOSIS — M4854XA Collapsed vertebra, not elsewhere classified, thoracic region, initial encounter for fracture: Secondary | ICD-10-CM | POA: Diagnosis not present

## 2023-07-03 DIAGNOSIS — R29890 Loss of height: Secondary | ICD-10-CM | POA: Diagnosis not present

## 2023-07-03 DIAGNOSIS — M549 Dorsalgia, unspecified: Secondary | ICD-10-CM | POA: Diagnosis not present

## 2023-07-03 DIAGNOSIS — S22000A Wedge compression fracture of unspecified thoracic vertebra, initial encounter for closed fracture: Secondary | ICD-10-CM | POA: Diagnosis not present

## 2023-07-03 DIAGNOSIS — M5134 Other intervertebral disc degeneration, thoracic region: Secondary | ICD-10-CM | POA: Diagnosis not present

## 2023-07-03 DIAGNOSIS — M47814 Spondylosis without myelopathy or radiculopathy, thoracic region: Secondary | ICD-10-CM | POA: Diagnosis not present

## 2023-07-09 NOTE — Telephone Encounter (Signed)
 VOB initiated for Zilretta for RIGHT knee OA.

## 2023-07-13 ENCOUNTER — Telehealth: Payer: Self-pay

## 2023-07-13 ENCOUNTER — Other Ambulatory Visit: Payer: Self-pay | Admitting: Family Medicine

## 2023-07-13 ENCOUNTER — Encounter: Payer: Self-pay | Admitting: Family Medicine

## 2023-07-13 DIAGNOSIS — F418 Other specified anxiety disorders: Secondary | ICD-10-CM

## 2023-07-13 MED ORDER — TRESIBA FLEXTOUCH 200 UNIT/ML ~~LOC~~ SOPN: PEN_INJECTOR | SUBCUTANEOUS | 1 refills | Status: DC

## 2023-07-13 NOTE — Telephone Encounter (Signed)
 Insurance updated to HTA.   VOB re-submitted.

## 2023-07-13 NOTE — Telephone Encounter (Signed)
 [employer] Adv Medicare Adv Subscriber WU:[id number]

## 2023-07-13 NOTE — Telephone Encounter (Signed)
 Can you schedule patient when we have medication in stock please.   Zilretta  authorized for bilateral knee No PA is required  Patients responsibility for in office injectables will be 20% with the remaining covered at 80% by the payer at a contracted rate Deductible does not apply to these services Patient has a $15 copay  Patient has an OOP maximum of $3600 and has accumulated $916.15 Once OOP is met coverage goes to 100% and copay will no longer apply Documents scanned.

## 2023-07-13 NOTE — Telephone Encounter (Signed)
 [employer] - policy termed  Update insurance

## 2023-07-14 ENCOUNTER — Encounter: Payer: Self-pay | Admitting: Family Medicine

## 2023-07-16 ENCOUNTER — Encounter: Payer: Self-pay | Admitting: Family Medicine

## 2023-07-16 NOTE — Telephone Encounter (Signed)
 ZILRETTA   Medical Buy and Raenette Bumps - Prior Authorization NOT required  Pharmacy Benefit - Prior Authorization NOT required

## 2023-07-16 NOTE — Telephone Encounter (Signed)
 ZILRETTA  for BILAT knee OA  OK to schedule on or after 08/03/23  Medical Buy and Bill  Primary Insurance: [employer] Adv HMO Co-pay: $5 Co-insurance: 20% Deductible: does not apply Prior Auth: NOT required  Pharmacy Benefit   PBM: Rx Advance Co-pay: $335.24 Co-insurance: n/a Deductible: undisclosed Prior Auth: NOT required   Knee Injection History 12/14/22 -  Kenalog  RIGHT 01/22/23 - Zilretta  RIGHT 05/10/23 - Zilretta  BILAT

## 2023-07-16 NOTE — Telephone Encounter (Addendum)
 Per other phone not, okay to schedule on or after 2/28.

## 2023-07-17 ENCOUNTER — Other Ambulatory Visit: Payer: Self-pay | Admitting: Family Medicine

## 2023-07-17 DIAGNOSIS — F418 Other specified anxiety disorders: Secondary | ICD-10-CM

## 2023-07-17 NOTE — Telephone Encounter (Signed)
Last Fill: 12/19/22  Last OV: 05/31/23 Next OV: 08/20/23  Routing to provider for review/authorization.

## 2023-07-17 NOTE — Telephone Encounter (Signed)
Copied from CRM [id number]. Topic: Clinical - Medication Refill >> Jul 17, 2023  3:41 PM Nila Nephew wrote: Most Recent Primary Care Visit:  Provider: Everrett Coombe  Department: [facility]  Visit Type: OFFICE VISIT  Date: 05/31/2023  Medication:  FLUoxetine (PROZAC) 40 MG capsule Insulin needles (if different for new insulin prescription)   Has the patient contacted their pharmacy? Yes - sent request  Is this the correct pharmacy for this prescription? Yes  This is the patient's preferred pharmacy:  [facility] [phone] - [place], [place] - [address] [place] [address] Phone: [phone] Fax: [phone]   Has the prescription been filled recently? No  Is the patient out of the medication? Yes  Has the patient been seen for an appointment in the last year OR does the patient have an upcoming appointment? Yes  Can we respond through MyChart? No - Phone Call  Agent: Please be advised that Rx refills may take up to 3 business days. We ask that you follow-up with your pharmacy.

## 2023-07-18 ENCOUNTER — Encounter: Payer: Self-pay | Admitting: Family Medicine

## 2023-07-18 ENCOUNTER — Ambulatory Visit: Payer: HMO | Admitting: Family Medicine

## 2023-07-18 VITALS — BP 138/82 | HR 83 | Ht 68.0 in | Wt 181.0 lb

## 2023-07-18 DIAGNOSIS — M549 Dorsalgia, unspecified: Secondary | ICD-10-CM

## 2023-07-18 MED ORDER — MELOXICAM 7.5 MG PO TABS: 7.5000 mg | ORAL_TABLET | Freq: Two times a day (BID) | ORAL | 1 refills | Status: DC | PRN

## 2023-07-18 MED ORDER — TIZANIDINE HCL 4 MG PO TABS: 4.0000 mg | ORAL_TABLET | Freq: Four times a day (QID) | ORAL | 1 refills | Status: DC | PRN

## 2023-07-18 NOTE — Progress Notes (Signed)
I, Stevenson Clinch, CMA acting as a scribe for Kelli Graham, MD.  Kelli [name] is a 78 y.o. female who presents to [facility] today for f/u thoracic back pain and MRI review. Pt was last seen by Dr. Denyse [name] on 06/19/23 and a t-spine MRI was ordered.  Today, pt reports some worsening pain today, minimal pain yesterday. Sx tend to worsen throughout the day and first thing in the mornings. Sx improve some with movement. Continues to have pain in the left upper back/shoulder. Shoulder pain described as pins and needles. MRI review today.   Dx imaging: 07/03/23 T-spine MRI  06/19/23 T-spine XR  Pertinent review of systems: No fevers or chills  Relevant historical information: History of lung cancer   Exam:  BP 138/82   Pulse 83   Ht 5\' 8"  (1.727 m)   Wt 181 lb (82.1 kg)   LMP  (LMP Unknown)   SpO2 95%   BMI 27.52 kg/m  General: Well Developed, well nourished, and in no acute distress.   MSK: T-spine tender palpation paraspinal musculature.  Decreased thoracic motion    Lab and Radiology Results  EXAM: MRI THORACIC SPINE WITHOUT CONTRAST   TECHNIQUE: Multiplanar, multisequence MR imaging of the thoracic spine was performed. No intravenous contrast was administered.   COMPARISON:  Radiography 06/19/2023   FINDINGS: Alignment:  Slightly increased thoracic kyphotic curvature.   Vertebrae: Chronic minor loss of height at T8 and T9. No residual edema. No evidence of underlying lesion. Loss of height no more than 10%. No retropulsed bone. Solid bridging anterior bony osteophytes at T7-8.   Cord:  No cord compression or focal cord lesion.   Paraspinal and other soft tissues: Negative   Disc levels:   No disc herniation. Minimal non-compressive disc bulges from T4-5 through T11-12. Facet osteoarthritis and ligamentous hypertrophy from T4-5 through T11-12. No edematous change or joint effusion. No significant encroachment upon the canal or  foramina. The findings could relate 2 ordinary regional back pain.   IMPRESSION: 1. Chronic minor loss of height at T8 and T9. No residual edema. No evidence of underlying lesion. Loss of height no more than 10%. No retropulsed bone. 2. Minimal non-compressive disc bulges from T4-5 through T11-12. Facet osteoarthritis and ligamentous hypertrophy from T4-5 through T11-12. No significant encroachment upon the canal or foramina. The findings could relate to regional back pain.     Electronically Signed   By: Paulina Fusi M.D.   On: 07/13/2023 19:40 I, Kelli [name], personally (independently) visualized and performed the interpretation of the images attached in this note.     Assessment and Plan: 78 y.o. female with chronic thoracic back pain.  MRI shows that the compression fracture seen on prior imaging are old and chronic and unlikely to be causing her pain.  Pain due to primarily muscle spasm and dysfunction and possibly some of the facet arthritis.  Next step if needed would be physical therapy.  She like to hold off on that for now.  I did refill tizanidine and meloxicam to use sparingly which she has had some benefit from in the past.   PDMP reviewed during this encounter. No orders of the defined types were placed in this encounter.  Meds ordered this encounter  Medications   tiZANidine (ZANAFLEX) 4 MG tablet    Sig: Take 1 tablet (4 mg total) by mouth every 6 (six) hours as needed for muscle spasms.    Dispense:  30 tablet  Refill:  1   meloxicam (MOBIC) 7.5 MG tablet    Sig: Take 1 tablet (7.5 mg total) by mouth every 12 (twelve) hours as needed for pain.    Dispense:  30 tablet    Refill:  1     Discussed warning signs or symptoms. Please see discharge instructions. Patient expresses understanding.   The above documentation has been reviewed and is accurate and complete Kelli [name], M.D.

## 2023-07-18 NOTE — Telephone Encounter (Signed)
Already refill today

## 2023-07-18 NOTE — Patient Instructions (Addendum)
Thank you for coming in today.   Consider PT.   OK to use muscle relaxer and meloxicam sparingly as needed.   We can do the zilretta in the knee on or after Feb 28th.

## 2023-08-20 ENCOUNTER — Ambulatory Visit (INDEPENDENT_AMBULATORY_CARE_PROVIDER_SITE_OTHER): Payer: No Typology Code available for payment source | Admitting: Family Medicine

## 2023-08-20 ENCOUNTER — Encounter: Payer: Self-pay | Admitting: Family Medicine

## 2023-08-20 VITALS — BP 144/75 | HR 71 | Ht 68.0 in | Wt 180.0 lb

## 2023-08-20 DIAGNOSIS — E785 Hyperlipidemia, unspecified: Secondary | ICD-10-CM | POA: Diagnosis not present

## 2023-08-20 DIAGNOSIS — E119 Type 2 diabetes mellitus without complications: Secondary | ICD-10-CM

## 2023-08-20 DIAGNOSIS — F418 Other specified anxiety disorders: Secondary | ICD-10-CM

## 2023-08-20 LAB — POCT GLYCOSYLATED HEMOGLOBIN (HGB A1C): HbA1c, POC (controlled diabetic range): 7.8 % — AB (ref 0.0–7.0)

## 2023-08-20 NOTE — Assessment & Plan Note (Signed)
 Blood sugars are better controlled.  Encouraged continued dietary change with increased activity.  Continue current strength of Tresiba.

## 2023-08-20 NOTE — Assessment & Plan Note (Signed)
She is doing well fluoxetine at current strength.  Will plan to continue gabapentin for anxiety and insomnia.

## 2023-08-20 NOTE — Assessment & Plan Note (Signed)
Tolerating pravastatin well at current strength.  Will plan to continue.

## 2023-08-20 NOTE — Progress Notes (Signed)
 Kelli [name] - 78 y.o. female MRN [id number]  Date of birth: 08-Aug-1945  Subjective Chief Complaint  Patient presents with   Diabetes    HPI Kelli [name] is a 78 y.o. female here today for follow up visit.   She reports that she is doing well at this time..   She remains on tresiba for management of diabetes.  Overall she is doing well with this.  Her A1c today is 7.8%.   Tolerating pravastatin for associated HLD   BP is stable with clonidine at 0.1mg  daily.  No side effects at current strength.  She has not had chest pain, shortness of breath, palpitations, headache or vision changes.   Mood is stable with fluoxetine.   ROS:  A comprehensive ROS was completed and negative except as noted per   No Known Allergies  Past Medical History:  Diagnosis Date   Anxiety    Arthritis    Borderline diabetic    Colon cancer (HCC)    colon ca dx 02/2009, lung cancer   Depression    Depression with anxiety 09/19/2011   Diabetes mellitus type 2, uncomplicated (HCC) 12/31/2014   Genetic testing 06/06/2022   H/O colon cancer, stage II 09/19/2011   T3N0  Cecum Sept 2010   High cholesterol    Hyperlipidemia 09/19/2011   Lung nodules 09/19/2011   granulomas   Osteoporosis 2022   Respiratory illness with fever 02/28/2023   Urinary urgency     Past Surgical History:  Procedure Laterality Date   APPENDECTOMY     AUGMENTATION MAMMAPLASTY     BREAST LUMPECTOMY WITH RADIOACTIVE SEED LOCALIZATION Right 03/22/2022   Procedure: RIGHT BREAST LUMPECTOMY WITH RADIOACTIVE SEED LOCALIZATION;  Surgeon: Almond Lint, MD;  Location: [facility];  Service: General;  Laterality: Right;   BREAST SURGERY  1978   augmentation   COLON SURGERY     2010   CRYO INTERCOSTAL NERVE BLOCK N/A 10/05/2014   Procedure: CRYO INTERCOSTAL NERVE BLOCK;  Surgeon: Loreli Slot, MD;  Location: [facility] OR;  Service: Thoracic;  Laterality: N/A;   EYE SURGERY     cataracts   LYMPH NODE DISSECTION Right  10/05/2014   Procedure: LYMPH NODE DISSECTION;  Surgeon: Loreli Slot, MD;  Location: [facility] OR;  Service: Thoracic;  Laterality: Right;   ORIF HUMERUS FRACTURE Left 10/01/2018   Procedure: OPEN REDUCTION INTERNAL FIXATION (ORIF) LEFT PROXIMAL HUMERUS FRACTURE;  Surgeon: Cammy Copa, MD;  Location: [facility] OR;  Service: Orthopedics;  Laterality: Left;   PARTIAL COLECTOMY Right 2010   SEGMENTECOMY Right 10/05/2014   Procedure: right lower lobe SEGMENTECTOMY;  Surgeon: Loreli Slot, MD;  Location: [facility] OR;  Service: Thoracic;  Laterality: Right;   VIDEO ASSISTED THORACOSCOPY (VATS)/WEDGE RESECTION Right 10/05/2014   Procedure: Right VIDEO ASSISTED THORACOSCOPY (VATS) with right lower lobe lung nodule WEDGE RESECTION;  Surgeon: Loreli Slot, MD;  Location: [facility];  Service: Thoracic;  Laterality: Right;    Social History   Socioeconomic History   Marital status: Divorced    Spouse name: Not on file   Number of children: 0   Years of education: 16   Highest education level: Bachelor's degree (e.g., BA, AB, BS)  Occupational History    Comment: Retired.  Tobacco Use   Smoking status: Former    Current packs/day: 0.00    Average packs/day: 3.0 packs/day for 48.7 years (146.0 ttl pk-yrs)    Types: Cigarettes    Start date: 64  Quit date: 02/06/2009    Years since quitting: 14.5   Smokeless tobacco: Never  Vaping Use   Vaping status: Never Used  Substance and Sexual Activity   Alcohol use: Yes    Alcohol/week: 2.0 standard drinks of alcohol    Types: 2 Cans of beer per week    Comment: 1-2 beers every other day   Drug use: Yes    Types: Marijuana    Comment: occasionally   Sexual activity: Not Currently    Birth control/protection: Post-menopausal  Other Topics Concern   Not on file  Social History Narrative   Lives alone with her puppy. She does not have any children. She enjoys reading, stream movies & series and swimming.   Right handed   One story home    Social Drivers of Health   Financial Resource Strain: Low Risk  (05/17/2023)   Overall Financial Resource Strain (CARDIA)    Difficulty of Paying Living Expenses: Not very hard  Food Insecurity: No Food Insecurity (05/17/2023)   Hunger Vital Sign    Worried About Running Out of Food in the Last Year: Never true    Ran Out of Food in the Last Year: Never true  Transportation Needs: No Transportation Needs (05/17/2023)   PRAPARE - Administrator, Civil Service (Medical): No    Lack of Transportation (Non-Medical): No  Physical Activity: Insufficiently Active (05/17/2023)   Exercise Vital Sign    Days of Exercise per Week: 1 day    Minutes of Exercise per Session: 20 min  Stress: No Stress Concern Present (05/17/2023)   [employer] of Occupational Health - Occupational Stress Questionnaire    Feeling of Stress : Not at all  Social Connections: Moderately Isolated (05/17/2023)   Social Connection and Isolation Panel [NHANES]    Frequency of Communication with Friends and Family: More than three times a week    Frequency of Social Gatherings with Friends and Family: Once a week    Attends Religious Services: 1 to 4 times per year    Active Member of [employer] or Organizations: No    Attends Banker Meetings: Never    Marital Status: Divorced    Family History  Problem Relation Age of Onset   Heart disease Mother    Colon polyps Sister    Lung cancer Sister    Heart disease Brother    Cancer Brother        unk type   Breast cancer Niece    Breast cancer Niece    Other Niece        gene +, possibly BRCA   Colon cancer Neg Hx    Esophageal cancer Neg Hx    Stomach cancer Neg Hx    Rectal cancer Neg Hx     Health Maintenance  Topic Date Due   OPHTHALMOLOGY EXAM  07/20/2023   COVID-19 Vaccine (7 - Pfizer risk 2024-25 season) 08/21/2023   FOOT EXAM  09/14/2023   Diabetic kidney evaluation - eGFR measurement  10/02/2023   Medicare Annual  Wellness (AWV)  12/06/2023   HEMOGLOBIN A1C  02/20/2024   Diabetic kidney evaluation - Urine ACR  02/21/2024   DEXA SCAN  12/05/2024   Colonoscopy  01/29/2025   DTaP/Tdap/Td (2 - Td or Tdap) 10/01/2028   Pneumonia Vaccine 64+ Years old  Completed   INFLUENZA VACCINE  Completed   Hepatitis C Screening  Completed   Zoster Vaccines- Shingrix  Completed   HPV VACCINES  Aged  Out     ----------------------------------------------------------------------------------------------------------------------------------------------------------------------------------------------------------------- Physical Exam LMP  (LMP Unknown)   Physical Exam Constitutional:      Appearance: Normal appearance.  Cardiovascular:     Rate and Rhythm: Normal rate and regular rhythm.  Pulmonary:     Effort: Pulmonary effort is normal.     Breath sounds: Normal breath sounds.  Neurological:     Mental Status: She is alert.  Psychiatric:        Mood and Affect: Mood normal.        Behavior: Behavior normal.     ------------------------------------------------------------------------------------------------------------------------------------------------------------------------------------------------------------------- Assessment and Plan  Diabetes mellitus type 2, uncomplicated (HCC) Blood sugars are better controlled.  Encouraged continued dietary change with increased activity.  Continue current strength of Tresiba.  Depression with anxiety She is doing well fluoxetine at current strength.  Will plan to continue gabapentin for anxiety and insomnia.  Hyperlipidemia Tolerating pravastatin well at current strength.  Will plan to continue.   No orders of the defined types were placed in this encounter.   Return in about 4 months (around 12/20/2023) for Type 2 Diabetes.    This visit occurred during the SARS-CoV-2 public health emergency.  Safety protocols were in place, including screening questions  prior to the visit, additional usage of staff PPE, and extensive cleaning of exam room while observing appropriate contact time as indicated for disinfecting solutions.

## 2023-08-22 ENCOUNTER — Encounter: Payer: Self-pay | Admitting: Family Medicine

## 2023-08-22 MED ORDER — ONETOUCH ULTRA VI STRP: ORAL_STRIP | 12 refills | Status: AC

## 2023-08-22 MED ORDER — ONETOUCH ULTRA 2 W/DEVICE KIT: PACK | 0 refills | Status: AC

## 2023-09-05 ENCOUNTER — Telehealth: Payer: Self-pay | Admitting: *Deleted

## 2023-09-05 NOTE — Telephone Encounter (Signed)
 Notified patient of CT chest at [facility] on 10/09/23 at 12:15/12:30. She also requested nurse send MyChart message with information.

## 2023-09-11 ENCOUNTER — Other Ambulatory Visit: Payer: Self-pay | Admitting: Family Medicine

## 2023-09-11 DIAGNOSIS — M19041 Primary osteoarthritis, right hand: Secondary | ICD-10-CM

## 2023-09-11 DIAGNOSIS — E785 Hyperlipidemia, unspecified: Secondary | ICD-10-CM

## 2023-09-17 NOTE — Telephone Encounter (Signed)
 Patient scheduled for 4/22.  Authorization still okay?

## 2023-09-25 ENCOUNTER — Ambulatory Visit: Admitting: Family Medicine

## 2023-09-25 ENCOUNTER — Other Ambulatory Visit: Payer: Self-pay

## 2023-09-25 VITALS — BP 132/76 | HR 65 | Ht 68.0 in | Wt 181.0 lb

## 2023-09-25 DIAGNOSIS — M170 Bilateral primary osteoarthritis of knee: Secondary | ICD-10-CM | POA: Diagnosis not present

## 2023-09-25 DIAGNOSIS — M25562 Pain in left knee: Secondary | ICD-10-CM | POA: Diagnosis not present

## 2023-09-25 DIAGNOSIS — G8929 Other chronic pain: Secondary | ICD-10-CM

## 2023-09-25 DIAGNOSIS — M25561 Pain in right knee: Secondary | ICD-10-CM

## 2023-09-25 MED ORDER — TRIAMCINOLONE ACETONIDE 32 MG IX SRER
32.0000 mg | Freq: Once | INTRA_ARTICULAR | Status: AC
  Administered 2023-09-25: 32 mg via INTRA_ARTICULAR

## 2023-09-25 NOTE — Progress Notes (Signed)
 Joanna Muck, PhD, LAT, ATC acting as a scribe for Garlan Juniper, MD.  Kelli [name] is a 78 y.o. female who presents to [facility] at [facility] today for exacerbation of her bilat knee OA. Pt was last seen for her knees on 05/10/23 and was given bilat Zilretta  injections.  Today, pt reports bilat knee pain has returned over the last couple weeks. Both knees are really painful.   Dx imaging: 12/14/22 R & L knee XR   Pertinent review of systems: No fevers or chills  Relevant historical information: History of lung cancer and diabetes.  History of osteoporosis.   Exam:  BP 132/76   Pulse 65   Ht 5\' 8"  (1.727 m)   Wt 181 lb (82.1 kg)   LMP  (LMP Unknown)   SpO2 94%   BMI 27.52 kg/m  General: Well Developed, well nourished, and in no acute distress.   MSK: Knees bilaterally mild effusion normal-appearing otherwise normal motion.    Lab and Radiology Results   Zilretta  injection bilateral knee Procedure: Real-time Ultrasound Guided Injection of right knee joint superior lateral patellar space Device: Philips Affiniti 50G Images permanently stored and available for review in PACS Verbal informed consent obtained.  Discussed risks and benefits of procedure. Warned about infection, hyperglycemia bleeding, damage to structures among others. Patient expresses understanding and agreement Time-out conducted.   Noted no overlying erythema, induration, or other signs of local infection.   Skin prepped in a sterile fashion.   Local anesthesia: Topical Ethyl chloride.   With sterile technique and under real time ultrasound guidance: Zilretta  32 mg injected into knee joint. Fluid seen entering the joint capsule.   Completed without difficulty   Advised to call if fevers/chills, erythema, induration, drainage, or persistent bleeding.   Images permanently stored and available for review in the ultrasound unit.  Impression: Technically successful ultrasound guided  injection.   Procedure: Real-time Ultrasound Guided Injection of left knee joint superior lateral patellar space Device: Philips Affiniti 50G Images permanently stored and available for review in PACS Verbal informed consent obtained.  Discussed risks and benefits of procedure. Warned about infection, hyperglycemia bleeding, damage to structures among others. Patient expresses understanding and agreement Time-out conducted.   Noted no overlying erythema, induration, or other signs of local infection.   Skin prepped in a sterile fashion.   Local anesthesia: Topical Ethyl chloride.   With sterile technique and under real time ultrasound guidance: Zilretta  32 mg injected into knee joint. Fluid seen entering the joint capsule.   Completed without difficulty   Advised to call if fevers/chills, erythema, induration, drainage, or persistent bleeding.   Images permanently stored and available for review in the ultrasound unit.  Impression: Technically successful ultrasound guided injection.  Lot number: [id number]      Assessment and Plan: 78 y.o. female with bilateral knee pain due to DJD.  Plan for Zilretta  injections today.  Can repeat this injection every 3 months if needed.  She will contact me when the pain starts returning.   PDMP not reviewed this encounter. Orders Placed This Encounter  Procedures   US  LIMITED JOINT SPACE STRUCTURES LOW BILAT(NO LINKED CHARGES)    Reason for Exam (SYMPTOM  OR DIAGNOSIS REQUIRED):   bilateral knee pain    Preferred imaging location?:   [facility] ordered this encounter  Medications   Triamcinolone  Acetonide (ZILRETTA ) intra-articular injection 32 mg   Triamcinolone  Acetonide (ZILRETTA ) intra-articular injection 32 mg  Discussed warning signs or symptoms. Please see discharge instructions. Patient expresses understanding.   The above documentation has been reviewed and is accurate and complete Garlan Juniper,  M.D.

## 2023-09-25 NOTE — Patient Instructions (Signed)
 Thank you for coming in today.   You received an injection today. Seek immediate medical attention if the joint becomes red, extremely painful, or is oozing fluid.

## 2023-09-26 ENCOUNTER — Encounter: Payer: Self-pay | Admitting: Family Medicine

## 2023-09-26 ENCOUNTER — Other Ambulatory Visit: Payer: Self-pay | Admitting: Family Medicine

## 2023-09-26 NOTE — Telephone Encounter (Signed)
 Last OV 09/26/23 Next OV not scheduled  Last refill  Meloxicam  07/18/23 Qty # 30/1  Tizanidine  07/18/23 Qty #30/1    Renal labs last checked 10/02/22 - forwarding to Dr. Alease Brekke as renal labs have not been checked in almost 1 year.            Component Ref Range & Units (hover) 11 mo ago (10/02/22) 1 yr ago (03/14/22) 1 yr ago (02/16/22) 2 yr ago (07/21/21) 2 yr ago (03/21/21) 2 yr ago (11/08/20) 2 yr ago (10/25/20)  Glucose, Bld 103 High  176 High  R, CM 115 High  CM 139 High  CM 245 High  R, CM 262 High  CM   Comment: .            Fasting reference interval . For someone without known diabetes, a glucose value between 100 and 125 mg/dL is consistent with prediabetes and should be confirmed with a follow-up test. .  BUN 15 13 R 15 16 14 15    Creat 0.71 0.81 R 0.70 0.70 0.79 0.79 R, CM 176.5 R  eGFR 89   91 CM 79 CM    BUN/Creatinine Ratio SEE NOTE:  SEE NOTE: CM NOT APPLICABLE NOT APPLICABLE NOT APPLICABLE   Comment:    Not Reported: BUN and Creatinine are within    reference range. .  Sodium 142 139 R 141 140 139 142   Potassium 4.5 4.3 R 4.4 4.3 4.1 4.0   Chloride 106 103 R 105 105 102 106   CO2 30 26 R 29 28 29 26    Calcium  9.3 9.6 R 9.5 9.0 9.4 9.3   Total Protein 6.4   6.7 6.6 7.0   Albumin  4.1   4.1 4.3 4.3   Globulin 2.3   2.6 2.3 2.7   AG Ratio 1.8   1.6 1.9 1.6   Total Bilirubin 0.4   0.4 0.5 0.2   Alkaline phosphatase (APISO) 82   85 76 87   AST 19   17 16 19    ALT 20   18 17  16

## 2023-09-27 NOTE — Telephone Encounter (Signed)
 Patient scheduled for 10/01/23.

## 2023-09-29 ENCOUNTER — Other Ambulatory Visit: Payer: Self-pay | Admitting: Family Medicine

## 2023-10-01 ENCOUNTER — Ambulatory Visit (INDEPENDENT_AMBULATORY_CARE_PROVIDER_SITE_OTHER): Admitting: Family Medicine

## 2023-10-01 ENCOUNTER — Encounter: Payer: Self-pay | Admitting: Family Medicine

## 2023-10-01 VITALS — BP 152/72 | HR 76 | Ht 68.0 in | Wt 180.8 lb

## 2023-10-01 DIAGNOSIS — E119 Type 2 diabetes mellitus without complications: Secondary | ICD-10-CM

## 2023-10-01 DIAGNOSIS — N3281 Overactive bladder: Secondary | ICD-10-CM | POA: Diagnosis not present

## 2023-10-01 DIAGNOSIS — N3941 Urge incontinence: Secondary | ICD-10-CM

## 2023-10-01 LAB — POCT URINALYSIS DIP (CLINITEK)
Bilirubin, UA: NEGATIVE
Blood, UA: NEGATIVE
Glucose, UA: 100 mg/dL — AB
Ketones, POC UA: NEGATIVE mg/dL
Leukocytes, UA: NEGATIVE
Nitrite, UA: NEGATIVE
POC PROTEIN,UA: 30 — AB
Spec Grav, UA: >=1.03 — AB (ref 1.010–1.025)
Urobilinogen, UA: 0.2 U/dL
pH, UA: 5.5 (ref 5.0–8.0)

## 2023-10-01 MED ORDER — GEMTESA 75 MG PO TABS: ORAL_TABLET | ORAL | 3 refills | Status: DC

## 2023-10-01 NOTE — Assessment & Plan Note (Signed)
 POC UA is relatively unremarkable.  Adding gemtesa given age and potential side effects with anticholinergics and will avoid myrbetriq given history of HTN.

## 2023-10-01 NOTE — Progress Notes (Signed)
 ROBERTINE PHUNG - 78 y.o. female MRN [id number]  Date of birth: 1946-03-06  Subjective Chief Complaint  Patient presents with   Urinary Incontinence    HPI EOLA BOUDOIN is a 78 y.o. female here today with complaint of urinary frequency and urgency.  She has had this for several months.  She denies pain with urination.  She denis fever, chills, flank pain or hematuria.    ROS:  A comprehensive ROS was completed and negative except as noted per HPI  No Known Allergies  Past Medical History:  Diagnosis Date   Anxiety    Arthritis    Borderline diabetic    Colon cancer (HCC)    colon ca dx 02/2009, lung cancer   Depression    Depression with anxiety 09/19/2011   Diabetes mellitus type 2, uncomplicated (HCC) 12/31/2014   Genetic testing 06/06/2022   H/O colon cancer, stage II 09/19/2011   T3N0  Cecum Sept 2010   High cholesterol    Hyperlipidemia 09/19/2011   Lung nodules 09/19/2011   granulomas   Osteoporosis 2022   Respiratory illness with fever 02/28/2023   Urinary urgency     Past Surgical History:  Procedure Laterality Date   APPENDECTOMY     AUGMENTATION MAMMAPLASTY     BREAST LUMPECTOMY WITH RADIOACTIVE SEED LOCALIZATION Right 03/22/2022   Procedure: RIGHT BREAST LUMPECTOMY WITH RADIOACTIVE SEED LOCALIZATION;  Surgeon: Lockie Rima, MD;  Location: [facility];  Service: General;  Laterality: Right;   BREAST SURGERY  1978   augmentation   COLON SURGERY     2010   CRYO INTERCOSTAL NERVE BLOCK N/A 10/05/2014   Procedure: CRYO INTERCOSTAL NERVE BLOCK;  Surgeon: Zelphia Higashi, MD;  Location: [facility] OR;  Service: Thoracic;  Laterality: N/A;   EYE SURGERY     cataracts   LYMPH NODE DISSECTION Right 10/05/2014   Procedure: LYMPH NODE DISSECTION;  Surgeon: Zelphia Higashi, MD;  Location: [facility] OR;  Service: Thoracic;  Laterality: Right;   ORIF HUMERUS FRACTURE Left 10/01/2018   Procedure: OPEN REDUCTION INTERNAL FIXATION (ORIF) LEFT PROXIMAL HUMERUS  FRACTURE;  Surgeon: Jasmine Mesi, MD;  Location: [facility] OR;  Service: Orthopedics;  Laterality: Left;   PARTIAL COLECTOMY Right 2010   SEGMENTECOMY Right 10/05/2014   Procedure: right lower lobe SEGMENTECTOMY;  Surgeon: Zelphia Higashi, MD;  Location: [facility] OR;  Service: Thoracic;  Laterality: Right;   VIDEO ASSISTED THORACOSCOPY (VATS)/WEDGE RESECTION Right 10/05/2014   Procedure: Right VIDEO ASSISTED THORACOSCOPY (VATS) with right lower lobe lung nodule WEDGE RESECTION;  Surgeon: Zelphia Higashi, MD;  Location: [facility] OR;  Service: Thoracic;  Laterality: Right;    Social History   Socioeconomic History   Marital status: Divorced    Spouse name: Not on file   Number of children: 0   Years of education: 16   Highest education level: Bachelor's degree (e.g., BA, AB, BS)  Occupational History    Comment: Retired.  Tobacco Use   Smoking status: Former    Current packs/day: 0.00    Average packs/day: 3.0 packs/day for 48.7 years (146.0 ttl pk-yrs)    Types: Cigarettes    Start date: 38    Quit date: 02/06/2009    Years since quitting: 14.6   Smokeless tobacco: Never  Vaping Use   Vaping status: Never Used  Substance and Sexual Activity   Alcohol use: Yes    Alcohol/week: 2.0 standard drinks of alcohol    Types: 2 Cans of beer per week  Comment: 1-2 beers every other day   Drug use: Yes    Types: Marijuana    Comment: occasionally   Sexual activity: Not Currently    Birth control/protection: Post-menopausal  Other Topics Concern   Not on file  Social History Narrative   Lives alone with her puppy. She does not have any children. She enjoys reading, stream movies & series and swimming.   Right handed   One story home   Social Drivers of Health   Financial Resource Strain: Low Risk  (05/17/2023)   Overall Financial Resource Strain (CARDIA)    Difficulty of Paying Living Expenses: Not very hard  Food Insecurity: No Food Insecurity (05/17/2023)   Hunger Vital Sign     Worried About Running Out of Food in the Last Year: Never true    Ran Out of Food in the Last Year: Never true  Transportation Needs: No Transportation Needs (05/17/2023)   PRAPARE - Administrator, Civil Service (Medical): No    Lack of Transportation (Non-Medical): No  Physical Activity: Insufficiently Active (05/17/2023)   Exercise Vital Sign    Days of Exercise per Week: 1 day    Minutes of Exercise per Session: 20 min  Stress: No Stress Concern Present (05/17/2023)   [employer] of Occupational Health - Occupational Stress Questionnaire    Feeling of Stress : Not at all  Social Connections: Moderately Isolated (05/17/2023)   Social Connection and Isolation Panel [NHANES]    Frequency of Communication with Friends and Family: More than three times a week    Frequency of Social Gatherings with Friends and Family: Once a week    Attends Religious Services: 1 to 4 times per year    Active Member of [employer] or Organizations: No    Attends Banker Meetings: Never    Marital Status: Divorced    Family History  Problem Relation Age of Onset   Heart disease Mother    Colon polyps Sister    Lung cancer Sister    Heart disease Brother    Cancer Brother        unk type   Breast cancer Niece    Breast cancer Niece    Other Niece        gene +, possibly BRCA   Colon cancer Neg Hx    Esophageal cancer Neg Hx    Stomach cancer Neg Hx    Rectal cancer Neg Hx     Health Maintenance  Topic Date Due   OPHTHALMOLOGY EXAM  07/20/2023   COVID-19 Vaccine (7 - Pfizer risk 2024-25 season) 08/21/2023   FOOT EXAM  09/14/2023   Diabetic kidney evaluation - eGFR measurement  10/02/2023   Medicare Annual Wellness (AWV)  12/06/2023   INFLUENZA VACCINE  01/04/2024   HEMOGLOBIN A1C  02/20/2024   Diabetic kidney evaluation - Urine ACR  02/21/2024   DEXA SCAN  12/05/2024   Colonoscopy  01/29/2025   DTaP/Tdap/Td (2 - Td or Tdap) 10/01/2028   Pneumonia Vaccine 65+  Years old  Completed   Hepatitis C Screening  Completed   Zoster Vaccines- Shingrix   Completed   HPV VACCINES  Aged Out   Meningococcal B Vaccine  Aged Out     ----------------------------------------------------------------------------------------------------------------------------------------------------------------------------------------------------------------- Physical Exam BP (!) 152/72 (BP Location: Left Arm, Patient Position: Sitting, Cuff Size: Normal)   Pulse 76   Ht 5\' 8"  (1.727 m)   Wt 180 lb 12.8 oz (82 kg)   LMP  (LMP Unknown)  SpO2 98%   BMI 27.49 kg/m   Physical Exam Constitutional:      Appearance: Normal appearance.  Neurological:     Mental Status: She is alert.  Psychiatric:        Mood and Affect: Mood normal.        Behavior: Behavior normal.     ------------------------------------------------------------------------------------------------------------------------------------------------------------------------------------------------------------------- Assessment and Plan  OAB (overactive bladder) POC UA is relatively unremarkable.  Adding gemtesa given age and potential side effects with anticholinergics and will avoid myrbetriq given history of HTN.     Meds ordered this encounter  Medications   Vibegron (GEMTESA) 75 MG TABS    Sig: Take 1 tab po daily.    Dispense:  30 tablet    Refill:  3    No follow-ups on file.

## 2023-10-05 ENCOUNTER — Encounter: Payer: Self-pay | Admitting: Family Medicine

## 2023-10-05 ENCOUNTER — Other Ambulatory Visit: Payer: Self-pay | Admitting: Family Medicine

## 2023-10-05 MED ORDER — MIRABEGRON ER 50 MG PO TB24: 50.0000 mg | ORAL_TABLET | Freq: Every day | ORAL | 1 refills | Status: DC

## 2023-10-09 ENCOUNTER — Ambulatory Visit (HOSPITAL_BASED_OUTPATIENT_CLINIC_OR_DEPARTMENT_OTHER)
Admission: RE | Admit: 2023-10-09 | Discharge: 2023-10-09 | Disposition: A | Discharge status: Home / Self Care | Source: Ambulatory Visit | Attending: Oncology | Admitting: Oncology

## 2023-10-09 ENCOUNTER — Encounter: Payer: Self-pay | Admitting: Family Medicine

## 2023-10-09 DIAGNOSIS — Z85118 Personal history of other malignant neoplasm of bronchus and lung: Secondary | ICD-10-CM | POA: Diagnosis not present

## 2023-10-09 DIAGNOSIS — J439 Emphysema, unspecified: Secondary | ICD-10-CM | POA: Diagnosis not present

## 2023-10-09 DIAGNOSIS — I700 Atherosclerosis of aorta: Secondary | ICD-10-CM | POA: Diagnosis not present

## 2023-10-09 DIAGNOSIS — R918 Other nonspecific abnormal finding of lung field: Secondary | ICD-10-CM | POA: Diagnosis not present

## 2023-10-09 MED ORDER — HYDROCODONE-ACETAMINOPHEN 5-325 MG PO TABS
1.0000 | ORAL_TABLET | Freq: Four times a day (QID) | ORAL | 0 refills | Status: DC | PRN
Start: 2023-10-09 — End: 2023-11-22

## 2023-10-09 MED ORDER — HYDROCODONE-ACETAMINOPHEN 5-325 MG PO TABS: 1.0000 | ORAL_TABLET | Freq: Four times a day (QID) | ORAL | 0 refills | Status: DC | PRN

## 2023-10-16 ENCOUNTER — Inpatient Hospital Stay: Payer: No Typology Code available for payment source | Attending: Oncology | Admitting: Oncology

## 2023-10-16 VITALS — BP 119/79 | HR 73 | Temp 98.2°F | Resp 18 | Ht 68.0 in | Wt 178.9 lb

## 2023-10-16 DIAGNOSIS — Z79811 Long term (current) use of aromatase inhibitors: Secondary | ICD-10-CM | POA: Diagnosis not present

## 2023-10-16 DIAGNOSIS — Z860100 Personal history of colon polyps, unspecified: Secondary | ICD-10-CM | POA: Diagnosis not present

## 2023-10-16 DIAGNOSIS — Z85118 Personal history of other malignant neoplasm of bronchus and lung: Secondary | ICD-10-CM | POA: Insufficient documentation

## 2023-10-16 DIAGNOSIS — Z87891 Personal history of nicotine dependence: Secondary | ICD-10-CM | POA: Insufficient documentation

## 2023-10-16 DIAGNOSIS — Z85038 Personal history of other malignant neoplasm of large intestine: Secondary | ICD-10-CM | POA: Diagnosis not present

## 2023-10-16 DIAGNOSIS — D0511 Intraductal carcinoma in situ of right breast: Secondary | ICD-10-CM | POA: Diagnosis not present

## 2023-10-16 NOTE — Progress Notes (Addendum)
 [facility] OFFICE PROGRESS NOTE   Diagnosis: Lung cancer, colon cancer, DCIS  INTERVAL HISTORY:   Kelli [name] returns as scheduled.  She feels well.  Good appetite.  She has back pain secondary to "compression fractures ".  She continues anastrozole .  Objective:  Vital signs in last 24 hours:  Blood pressure 119/79, pulse 73, temperature 98.2 F (36.8 C), resp. rate 18, height 5\' 8"  (1.727 m), weight 178 lb 14.4 oz (81.1 kg), SpO2 96%.     Lymphatics: No cervical, supraclavicular, axillary, or inguinal nodes Resp: Decreased breath sounds at the right compared to the left chest, no respiratory distress Cardio: Regular rate and rhythm GI: No hepatosplenomegaly Vascular: No leg edema Breast: Bilateral breast implants are firm, no discrete mass in either breast.  No evidence for recurrent tumor at the right lumpectomy scar, 2 cm mobile cutaneous lesion inferior to the left breast   Lab Results:  Lab Results  Component Value Date   WBC 6.9 10/02/2022   HGB 12.5 10/02/2022   HCT 37.9 10/02/2022   MCV 93.8 10/02/2022   PLT 276 10/02/2022   NEUTROABS 3,436 10/02/2022    CMP  Lab Results  Component Value Date   NA 142 10/02/2022   K 4.5 10/02/2022   CL 106 10/02/2022   CO2 30 10/02/2022   GLUCOSE 103 (H) 10/02/2022   BUN 15 10/02/2022   CREATININE 0.71 10/02/2022   CALCIUM  9.3 10/02/2022   PROT 6.4 10/02/2022   ALBUMIN  2.1 (L) 09/20/2020   AST 19 10/02/2022   ALT 20 10/02/2022   ALKPHOS 100 09/20/2020   BILITOT 0.4 10/02/2022   GFRNONAA >60 03/14/2022   GFRAA 87 11/08/2020    Lab Results  Component Value Date   CEA 1.8 08/28/2014   Medications: I have reviewed the patient's current medications.   Assessment/Plan:  Stage II (T3 N0) adenocarcinoma the cecum diagnosed in September 2010, status post adjuvant Xeloda MSI-high, BRAF mutation detected 2. History of colonic polyps-status post a surveillance colonoscopy in February 2016 with a  tubular adenoma removed Colonoscopy 01/30/2020-polyps removed from the rectum and descending colon, hyperplastic polyps 3. History of heavy tobacco use, quit in 2010 4. History of lung nodules-last imaging was an abdomen CT in October 2013 prior to repeat imaging 09/03/2014 CT 09/03/2014 revealed enlargement of a spiculated right lower lobe nodule PET scan 09/16/2014 revealed low-level FDG activity associated with the right lower lobe spiculated nodule Status post a right lower lobe basilar segmentectomy and mediastinal lymph node dissection on 10/06/2014 with the pathology confirming a minimally invasive well-differentiated adenocarcinoma,pT1a,N0, invasive component measured less than 0.3 cm, with negative surgical margins CT chest 09/15/2015 with no evidence of recurrent lung cancer, stable nodules except for a probable new 4 mm right lower lobe subpleural nodule CT chest 06/19/2016-no evidence of recurrent lung cancer, stable lung nodules CT chest 03/20/2017-stable lung nodules CT chest 03/26/2018- stable lung nodules CT chest 04/09/2019-stable lung nodules CT chest 04/10/2019-stable lung nodules CT chest 10/25/2020-stable lung nodules.  Resolution of multifocal pulmonary infiltrate and improvement in bronchial wall thickening. CT chest 01/18/2021-stable small solid nodules and groundglass opacities. CT chest 07/23/2021-subpleural focal area of groundglass attenuation within the anterior left upper lobe mildly increased when compared to remote priors, stable when compared to more recent priors.  Additional multiple bilateral pulmonary nodules grossly stable when compared to chest CT exams dating back to 2018. CT chest 01/19/2022-stable upper lobe groundglass opacity, multiple stable bilateral pulmonary nodules CT chest 04/06/2023-mild progression of clustered groundglass  nodularity in the left upper lobe, additional small solid and groundglass nodules are stable, stable postoperative changes in the right  lung CT chest 10/09/2023: Right lower lobe wedge resection, no evidence for recurrent or metastatic disease, stable small solid and groundglass bilateral lung nodules.  Clustered groundglass nodularity in the left upper lobe is stable   5.  Post thoracotomy pain-resolved   6.  Left humerus fracture following a fall, status post surgical repair 10/01/2018  7.  Breast DCIS Mammogram 02/17/2022-new group of heterogenous calcifications in the posterior upper right breast spanning 2.5 cm Targeted right breast ultrasound 915 2023-1 x 0.5 x 1.7 cm ill-defined hypoechoic area at the 12 o'clock position of the right breast with calcifications no abnormal right axillary lymph nodes Ultrasound-guided right breast biopsy 02/27/2022-media grade DCIS, no invasive carcinoma, necrosis and calcifications present, DCIS length 0.5 cm, ER 95%, PR 50% Seed localized right lumpectomy 03/22/2022, anterior margin skin, posterior margin implant capsule-DCIS, grade 2, 11 x 9 x 8 mm, necrosis present, all margins negative, closest margin anterior-2 mm, superior-3 mm, medial-4 mm, posterior-6 mm, inferior-9 mm, lateral 24 mm.  No lymph nodes submitted Anastrozole  beginning after office visit with Dr. Eloise Hake 04/12/2022        Disposition: Kelli [name] remains in clinical remission from lung cancer and colon cancer.  She continues anastrozole  as adjuvant therapy for DCIS. She will continue colonoscopy surveillance at [facility].  She will schedule yearly mammogram.  Kelli [name] will continue follow-up with Dr. Augustus Ledger for management of osteopenia and compression fractures.  I reviewed the chest CT findings and images with her.  Kelli [name] will return for an office visit and surveillance chest CT in 1 year.  Coni Deep, MD  10/16/2023  11:55 AM

## 2023-10-17 ENCOUNTER — Telehealth: Payer: Self-pay | Admitting: Oncology

## 2023-10-17 NOTE — Telephone Encounter (Signed)
 Patient has been scheduled for follow-up visit per 10/11/23 LOS.  Pt aware of scheduled appt details.

## 2023-11-20 ENCOUNTER — Ambulatory Visit: Admitting: Family Medicine

## 2023-11-22 ENCOUNTER — Encounter

## 2023-11-22 ENCOUNTER — Ambulatory Visit

## 2023-11-22 VITALS — Ht 68.0 in | Wt 175.0 lb

## 2023-11-22 DIAGNOSIS — Z122 Encounter for screening for malignant neoplasm of respiratory organs: Secondary | ICD-10-CM

## 2023-11-22 DIAGNOSIS — Z0000 Encounter for general adult medical examination without abnormal findings: Secondary | ICD-10-CM

## 2023-11-22 NOTE — Patient Instructions (Signed)
  Kelli [name] , Thank you for taking time to come for your Medicare Wellness Visit. I appreciate your ongoing commitment to your health goals. Please review the following plan we discussed and let me know if I can assist you in the future.   These are the goals we discussed:  Goals       Patient Stated (pt-stated)      10/25/2020 AWV Goal: Exercise for General Health  Patient will verbalize understanding of the benefits of increased physical activity: Exercising regularly is important. It will improve your overall fitness, flexibility, and endurance. Regular exercise also will improve your overall health. It can help you control your weight, reduce stress, and improve your bone density. Over the next year, patient will increase physical activity as tolerated with a goal of at least 150 minutes of moderate physical activity per week.  You can tell that you are exercising at a moderate intensity if your heart starts beating faster and you start breathing faster but can still hold a conversation. Moderate-intensity exercise ideas include: Walking 1 mile (1.6 km) in about 15 minutes Biking Hiking Golfing Dancing Water  aerobics Patient will verbalize understanding of everyday activities that increase physical activity by providing examples like the following: Yard work, such as: Insurance underwriter Gardening Washing windows or floors Patient will be able to explain general safety guidelines for exercising:  Before you start a new exercise program, talk with your health care provider. Do not exercise so much that you hurt yourself, feel dizzy, or get very short of breath. Wear comfortable clothes and wear shoes with good support. Drink plenty of water  while you exercise to prevent dehydration or heat stroke. Work out until your breathing and your heartbeat get faster.       Patient Stated (pt-stated)       Would like to loose some weight.       Patient Stated (pt-stated)      Patient stated that she would like to loose some weight.      Patient Stated      Patient states she would like to lose weight.         This is a list of the screening recommended for you and due dates:  Health Maintenance  Topic Date Due   Eye exam for diabetics  07/20/2023   COVID-19 Vaccine (7 - Pfizer risk 2024-25 season) 08/21/2023   Complete foot exam   09/14/2023   Yearly kidney function blood test for diabetes  10/02/2023   Flu Shot  01/04/2024   Hemoglobin A1C  02/20/2024   Yearly kidney health urinalysis for diabetes  02/21/2024   Medicare Annual Wellness Visit  11/21/2024   DEXA scan (bone density measurement)  12/05/2024   Colon Cancer Screening  01/29/2025   DTaP/Tdap/Td vaccine (2 - Td or Tdap) 10/01/2028   Pneumococcal Vaccine for age over 101  Completed   Hepatitis C Screening  Completed   Zoster (Shingles) Vaccine  Completed   HPV Vaccine  Aged Out   Meningitis B Vaccine  Aged Out

## 2023-11-22 NOTE — Progress Notes (Signed)
 Subjective:   Kelli [name] is a 78 y.o. female who presents for Medicare Annual (Subsequent) preventive examination.  Visit Complete: Virtual I connected with  Arch Beans on 11/22/23 by a audio enabled telemedicine application and verified that I am speaking with the correct person using two identifiers.  Patient Location: Other:  traveling   Provider Location: Office/Clinic  I discussed the limitations of evaluation and management by telemedicine. The patient expressed understanding and agreed to proceed.  Vital Signs: Because this visit was a virtual/telehealth visit, some criteria may be missing or patient reported. Any vitals not documented were not able to be obtained and vitals that have been documented are patient reported.  Patient Medicare AWV questionnaire was completed by the patient on 11/14/2023; I have confirmed that all information answered by patient is correct and no changes since this date.  Cardiac Risk Factors include: advanced age (>49men, >17 women);diabetes mellitus;smoking/ tobacco exposure;dyslipidemia     Objective:    Today's Vitals   11/22/23 1518  Weight: 175 lb (79.4 kg)  Height: 5' 8 (1.727 m)   Body mass index is 26.61 kg/m.     11/22/2023    3:25 PM 10/16/2023   11:12 AM 01/18/2023    1:01 PM 11/06/2022    2:01 PM 10/09/2022    2:58 PM 09/26/2022   11:54 AM 04/12/2022   10:32 AM  Advanced Directives  Does Patient Have a Medical Advance Directive? No Yes Yes Yes Yes Yes Yes  Type of Advance Directive  Living will;Healthcare Power of [employer] Power of Attorney Living will Healthcare Power of [place];Living will Healthcare Power of [place];Living will Healthcare Power of [place];Living will  Does patient want to make changes to medical advance directive?  No - Patient declined No - Patient declined No - Patient declined No - Patient declined    Copy of Healthcare Power of Attorney in Chart?  Yes - validated most recent copy scanned  in chart (See row information) No - copy requested  Yes - validated most recent copy scanned in chart (See row information) No - copy requested Yes - validated most recent copy scanned in chart (See row information)  Would patient like information on creating a medical advance directive? No - Patient declined          Current Medications (verified) Outpatient Encounter Medications as of 11/22/2023  Medication Sig   AMBULATORY NON FORMULARY MEDICATION Single glucometer with lancets, test strips. Test daily.  Disp qs 3 months E11.9   anastrozole  (ARIMIDEX ) 1 MG tablet TAKE 1 TABLET BY MOUTH EVERY DAY   B-D UF III MINI PEN NEEDLES 31G X 5 MM MISC USE AS DIRECTED   Blood Glucose Monitoring Suppl (ONE TOUCH ULTRA 2) w/Device KIT Check glucose 3 times daily.   Calcium  Carb-Cholecalciferol (CALCIUM  1000 + D) 1000-20 MG-MCG TABS Take 1 tablet by mouth daily.   Cholecalciferol (VITAMIN D ) 2000 UNITS tablet Take 2,000 Units by mouth daily.   cloNIDine  (CATAPRES ) 0.1 MG tablet TAKE 1 TABLET BY MOUTH EVERY DAY   cyanocobalamin  1000 MCG tablet Take 1,000 mcg by mouth daily.   FLUoxetine  (PROZAC ) 40 MG capsule TAKE 1 CAPSULE BY MOUTH EVERY DAY   gabapentin  (NEURONTIN ) 300 MG capsule TAKE 1 CAPSULE (300 MG TOTAL) BY MOUTH AT BEDTIME AS NEEDED FOR INSOMNIA   glucose blood (ONETOUCH ULTRA) test strip Check glucose 3 times daily.   insulin  degludec (TRESIBA  FLEXTOUCH) 200 UNIT/ML FlexTouch Pen INJECT 35 UNITS TWICE DAILY   Lancets (  ONETOUCH DELICA PLUS LANCET33G) MISC TEST BLOOD SUGARS DAILY   meloxicam  (MOBIC ) 7.5 MG tablet TAKE 1 TABLET (7.5 MG TOTAL) BY MOUTH EVERY 12 (TWELVE) HOURS AS NEEDED FOR PAIN.   mirabegron  ER (MYRBETRIQ ) 50 MG TB24 tablet Take 1 tablet (50 mg total) by mouth daily.   pravastatin  (PRAVACHOL ) 80 MG tablet TAKE 1 TABLET BY MOUTH EVERY DAY   tiZANidine  (ZANAFLEX ) 4 MG tablet TAKE 1 TABLET BY MOUTH EVERY 6 HOURS AS NEEDED FOR MUSCLE SPASMS.   ZILRETTA  32 MG SRER intra-articular  injection    [DISCONTINUED] HYDROcodone -acetaminophen  (NORCO) 5-325 MG tablet Take 1-2 tablets by mouth every 6 (six) hours as needed for severe pain (pain score 7-10).   No facility-administered encounter medications on file as of 11/22/2023.    Allergies (verified) Patient has no known allergies.   History: Past Medical History:  Diagnosis Date   Anxiety    Arthritis    Borderline diabetic    Colon cancer (HCC)    colon ca dx 02/2009, lung cancer   Depression    Depression with anxiety 09/19/2011   Diabetes mellitus type 2, uncomplicated (HCC) 12/31/2014   Genetic testing 06/06/2022   H/O colon cancer, stage II 09/19/2011   T3N0  [place] Sept 2010   High cholesterol    Hyperlipidemia 09/19/2011   Lung nodules 09/19/2011   granulomas   Osteoporosis 2022   Respiratory illness with fever 02/28/2023   Urinary urgency    Past Surgical History:  Procedure Laterality Date   APPENDECTOMY     AUGMENTATION MAMMAPLASTY     BREAST LUMPECTOMY WITH RADIOACTIVE SEED LOCALIZATION Right 03/22/2022   Procedure: RIGHT BREAST LUMPECTOMY WITH RADIOACTIVE SEED LOCALIZATION;  Surgeon: Lockie Rima, MD;  Location: [facility];  Service: General;  Laterality: Right;   BREAST SURGERY  1978   augmentation   COLON SURGERY     2010   CRYO INTERCOSTAL NERVE BLOCK N/A 10/05/2014   Procedure: CRYO INTERCOSTAL NERVE BLOCK;  Surgeon: Zelphia Higashi, MD;  Location: [facility] OR;  Service: Thoracic;  Laterality: N/A;   EYE SURGERY     cataracts   LYMPH NODE DISSECTION Right 10/05/2014   Procedure: LYMPH NODE DISSECTION;  Surgeon: Zelphia Higashi, MD;  [facility];  Service: Thoracic;  Laterality: Right;   ORIF HUMERUS FRACTURE Left 10/01/2018   Procedure: OPEN REDUCTION INTERNAL FIXATION (ORIF) LEFT PROXIMAL HUMERUS FRACTURE;  Surgeon: Jasmine Mesi, MD;  Location: [facility] OR;  Service: Orthopedics;  Laterality: Left;   PARTIAL COLECTOMY Right 2010   SEGMENTECOMY Right 10/05/2014    Procedure: right lower lobe SEGMENTECTOMY;  Surgeon: Zelphia Higashi, MD;  Location[facility] OR;  Service: Thoracic;  Laterality: Right;   VIDEO ASSISTED THORACOSCOPY (VATS)/WEDGE RESECTION Right 10/05/2014   Procedure: Right VIDEO ASSISTED THORACOSCOPY (VATS) with right lower lobe lung nodule WEDGE RESECTION;  Surgeon: Zelphia Higashi, MD;  Location: [facility] OR;  Service: Thoracic;  Laterality: Right;   Family History  Problem Relation Age of Onset   Heart disease Mother    Colon polyps Sister    Lung cancer Sister    Heart disease Brother    Cancer Brother        unk type   Breast cancer Niece    Breast cancer Niece    Other Niece        gene +, possibly BRCA   Colon cancer Neg Hx    Esophageal cancer Neg Hx    Stomach cancer Neg Hx  Rectal cancer Neg Hx    Social History   Socioeconomic History   Marital status: Divorced    Spouse name: Not on file   Number of children: 0   Years of education: 16   Highest education level: Bachelor's degree (e.g., BA, AB, BS)  Occupational History    Comment: Retired.  Tobacco Use   Smoking status: Former    Current packs/day: 0.00    Average packs/day: 3.0 packs/day for 48.7 years (146.0 ttl pk-yrs)    Types: Cigarettes    Start date: 54    Quit date: 02/06/2009    Years since quitting: 14.8   Smokeless tobacco: Never  Vaping Use   Vaping status: Never Used  Substance and Sexual Activity   Alcohol use: Yes    Alcohol/week: 2.0 standard drinks of alcohol    Types: 2 Cans of beer per week    Comment: 1-2 beers every other day   Drug use: Yes    Types: Marijuana    Comment: occasionally   Sexual activity: Not Currently    Birth control/protection: Post-menopausal  Other Topics Concern   Not on file  Social History Narrative   Lives alone with her puppy. She does not have any children. She enjoys reading, stream movies & series and swimming.   Right handed   One story home   Social Drivers of Health   Financial Resource  Strain: Low Risk  (11/22/2023)   Overall Financial Resource Strain (CARDIA)    Difficulty of Paying Living Expenses: Not hard at all  Food Insecurity: No Food Insecurity (11/22/2023)   Hunger Vital Sign    Worried About Running Out of Food in the Last Year: Never true    Ran Out of Food in the Last Year: Never true  Transportation Needs: No Transportation Needs (11/22/2023)   PRAPARE - Administrator, Civil Service (Medical): No    Lack of Transportation (Non-Medical): No  Physical Activity: Insufficiently Active (11/22/2023)   Exercise Vital Sign    Days of Exercise per Week: 1 day    Minutes of Exercise per Session: 20 min  Stress: No Stress Concern Present (11/22/2023)   [employer] of Occupational Health - Occupational Stress Questionnaire    Feeling of Stress: Not at all  Social Connections: Socially Isolated (11/22/2023)   Social Connection and Isolation Panel    Frequency of Communication with Friends and Family: More than three times a week    Frequency of Social Gatherings with Friends and Family: Twice a week    Attends Religious Services: Patient declined    Database administrator or Organizations: Patient declined    Attends Banker Meetings: Never    Marital Status: Divorced    Tobacco Counseling Counseling given: Not Answered   Clinical Intake:  Pre-visit preparation completed: Yes  Pain : No/denies pain     BMI - recorded: 26.61 Nutritional Status: BMI 25 -29 Overweight Nutritional Risks: None Diabetes: Yes CBG done?: No Did pt. bring in CBG monitor from home?: No  How often do you need to have someone help you when you read instructions, pamphlets, or other written materials from your doctor or pharmacy?: 1 - Never What is the last grade level you completed in school?: 16  Interpreter Needed?: No      Activities of Daily Living    11/22/2023    3:20 PM 11/18/2023   10:33 AM  In your present state of health, do you have  any difficulty performing the following activities:  Hearing? 1 1  Vision? 0 0  Difficulty concentrating or making decisions? 0 0  Walking or climbing stairs? 0 0  Dressing or bathing? 0 0  Doing errands, shopping? 0 0  Preparing Food and eating ? N N  Using the Toilet? N N  In the past six months, have you accidently leaked urine? Y Y  Do you have problems with loss of bowel control? N N  Managing your Medications? N N  Managing your Finances? N N  Housekeeping or managing your Housekeeping? N N    Patient Care Team: Adela Holter, DO as PCP - General (Family Medicine) Scotty Cyphers, MD as Consulting Physician (Oncology) Janel Medford, MD (Inactive) as Attending Physician (Gastroenterology) Sumner Ends, MD as Consulting Physician (Oncology) Corie Diamond, MD as Attending Physician (Ophthalmology)  Indicate any recent Medical Services you may have received from other than [facility] providers in the past year (date may be approximate).     Assessment:   This is a routine wellness examination for Caraline.  Hearing/Vision screen No results found.   Goals Addressed             This Visit's Progress    Patient Stated       Patient states she would like to lose weight.        Depression Screen    11/22/2023    3:24 PM 02/21/2023    8:18 AM 11/06/2022    2:01 PM 09/14/2022    3:24 PM 05/18/2022    2:28 PM 05/18/2022    2:27 PM 02/16/2022    3:25 PM  PHQ 2/9 Scores  PHQ - 2 Score 0 0 0 0 0 0 0  PHQ- 9 Score  0  0 0  0    Fall Risk    11/22/2023    3:26 PM 11/18/2023   10:33 AM 02/21/2023    8:18 AM 01/18/2023    1:01 PM 11/06/2022    2:01 PM  Fall Risk   Falls in the past year? 0 0 0 0 0  Number falls in past yr: 0 0 0 0 0  Injury with Fall? 0 0 0 0 0  Risk for fall due to :   No Fall Risks  No Fall Risks  Follow up Falls evaluation completed  Falls evaluation completed Falls evaluation completed Falls evaluation completed    MEDICARE RISK AT  HOME: Medicare Risk at Home Any stairs in or around the home?: No Home free of loose throw rugs in walkways, pet beds, electrical cords, etc?: Yes Adequate lighting in your home to reduce risk of falls?: (Patient-Rptd) Yes Life alert?: No Use of a cane, walker or w/c?: No Grab bars in the bathroom?: No Shower chair or bench in shower?: No Elevated toilet seat or a handicapped toilet?: No  TIMED UP AND GO:  Was the test performed?  No    Cognitive Function:        11/22/2023    3:26 PM 11/06/2022    2:08 PM 11/01/2021    1:46 PM 10/25/2020    2:15 PM  6CIT Screen  What Year? 0 points 0 points 0 points 0 points  What month? 0 points 0 points 0 points 0 points  What time? 0 points 0 points 0 points 0 points  Count back from 20 0 points 0 points 0 points 0 points  Months in reverse 0 points 0 points 0 points 0  points  Repeat phrase 0 points 0 points 0 points 0 points  Total Score 0 points 0 points 0 points 0 points    Immunizations Immunization History  Administered Date(s) Administered   Fluad Quad(high Dose 65+) 03/13/2019, 04/01/2020, 03/31/2021, 02/16/2022, 02/21/2023   Influenza,inj,Quad PF,6+ Mos 03/20/2018   PFIZER(Purple Top)SARS-COV-2 Vaccination 08/08/2019, 09/03/2019, 04/12/2020, 01/20/2021, 03/30/2022   Pfizer Covid-19 Vaccine Bivalent Booster 51yrs & up 03/30/2022   Pfizer(Comirnaty)Fall Seasonal Vaccine 12 years and older 03/30/2022, 02/21/2023   Pneumococcal Conjugate-13 08/22/2016   Pneumococcal Polysaccharide-23 07/05/2021   Pneumococcal-Unspecified 02/03/2009   Tdap 10/02/2018   Zoster Recombinant(Shingrix ) 03/30/2022, 07/26/2022    TDAP status: Up to date  Flu Vaccine status: Up to date  Pneumococcal vaccine status: Up to date  Covid-19 vaccine status: Completed vaccines  Qualifies for Shingles Vaccine? Yes   Zostavax completed No   Shingrix  Completed?: Yes  Screening Tests Health Maintenance  Topic Date Due   OPHTHALMOLOGY EXAM  07/20/2023    COVID-19 Vaccine (7 - Pfizer risk 2024-25 season) 08/21/2023   FOOT EXAM  09/14/2023   Diabetic kidney evaluation - eGFR measurement  10/02/2023   INFLUENZA VACCINE  01/04/2024   HEMOGLOBIN A1C  02/20/2024   Diabetic kidney evaluation - Urine ACR  02/21/2024   Medicare Annual Wellness (AWV)  11/21/2024   DEXA SCAN  12/05/2024   Colonoscopy  01/29/2025   DTaP/Tdap/Td (2 - Td or Tdap) 10/01/2028   Pneumococcal Vaccine: 50+ Years  Completed   Hepatitis C Screening  Completed   Zoster Vaccines- Shingrix   Completed   HPV VACCINES  Aged Out   Meningococcal B Vaccine  Aged Out    Health Maintenance  Health Maintenance Due  Topic Date Due   OPHTHALMOLOGY EXAM  07/20/2023   COVID-19 Vaccine (7 - Pfizer risk 2024-25 season) 08/21/2023   FOOT EXAM  09/14/2023   Diabetic kidney evaluation - eGFR measurement  10/02/2023    Colorectal cancer screening: Type of screening: Colonoscopy. Completed 01/30/2020. Repeat every 5 years  Mammogram status: Completed 2023. Repeat every year  Bone Density status: Completed 12/06/2022. Results reflect: Bone density results: OSTEOPENIA. Repeat every 2 years.  Lung Cancer Screening: (Low Dose CT Chest recommended if Age 56-80 years, 20 pack-year currently smoking OR have quit w/in 15years.) does qualify.   Lung Cancer Screening Referral: Already in program.   Additional Screening:  Hepatitis C Screening: does not qualify; Completed 07/25/2016  Vision Screening: Recommended annual ophthalmology exams for early detection of glaucoma and other disorders of the eye. Is the patient up to date with their annual eye exam?  Yes  Who is the provider or what is the name of the office in which the patient attends annual eye exams? Dr Allison Ivory If pt is not established with a provider, would they like to be referred to a provider to establish care? N/a.   Dental Screening: Recommended annual dental exams for proper oral hygiene  Diabetic Foot Exam: Diabetic Foot Exam:  Completed 09/14/2022  Community Resource Referral / Chronic Care Management: CRR required this visit?  Yes   CCM required this visit?  No     Plan:     I have personally reviewed and noted the following in the patient's chart:   Medical and social history Use of alcohol, tobacco or illicit drugs  Current medications and supplements including opioid prescriptions. Patient is not currently taking opioid prescriptions. Functional ability and status Nutritional status Physical activity Advanced directives List of other physicians Hospitalizations, surgeries, and ER visits in  previous 12 months. None Vitals Screenings to include cognitive, depression, and falls Referrals and appointments  In addition, I have reviewed and discussed with patient certain preventive protocols, quality metrics, and best practice recommendations. A written personalized care plan for preventive services as well as general preventive health recommendations were provided to patient.     Aubrey Leaf, CMA   11/22/2023   After Visit Summary: (MyChart) Due to this being a telephonic visit, the after visit summary with patients personalized plan was offered to patient via MyChart   Nurse Notes:   JERLISA DILIBERTO is a 78 y.o. female patient of Adela Holter, DO who had a Medicare Annual Wellness Visit today via telephone. Zaneta is Retired and lives alone. She does not have any children. she reports that she is socially active and does interact with friends/family regularly. She is moderately physically active and enjoys reading, stream movies & series and swimming.   Sent request for diabetic eye exam.

## 2023-11-29 ENCOUNTER — Encounter: Payer: Self-pay | Admitting: Family Medicine

## 2023-11-29 ENCOUNTER — Ambulatory Visit: Admitting: Family Medicine

## 2023-11-29 VITALS — BP 126/72 | HR 75 | Ht 68.0 in | Wt 179.0 lb

## 2023-11-29 DIAGNOSIS — F418 Other specified anxiety disorders: Secondary | ICD-10-CM

## 2023-11-29 DIAGNOSIS — E119 Type 2 diabetes mellitus without complications: Secondary | ICD-10-CM

## 2023-11-29 DIAGNOSIS — N3281 Overactive bladder: Secondary | ICD-10-CM | POA: Diagnosis not present

## 2023-11-29 DIAGNOSIS — E785 Hyperlipidemia, unspecified: Secondary | ICD-10-CM | POA: Diagnosis not present

## 2023-11-29 LAB — POCT GLYCOSYLATED HEMOGLOBIN (HGB A1C): HbA1c, POC (controlled diabetic range): 8.2 % — AB (ref 0.0–7.0)

## 2023-11-29 LAB — POCT UA - MICROALBUMIN
Albumin/Creatinine Ratio, Urine, POC: <30
Creatinine, POC: 200 mg/dL
Microalbumin Ur, POC: 30 mg/L

## 2023-11-29 MED ORDER — SOLIFENACIN SUCCINATE 5 MG PO TABS: 5.0000 mg | ORAL_TABLET | Freq: Every day | ORAL | 3 refills | Status: AC

## 2023-11-29 NOTE — Progress Notes (Signed)
 Kelli [name] - 78 y.o. female MRN [id number]  Date of birth: Oct 21, 1945  Subjective Chief Complaint  Patient presents with   Diabetes    HPI Kelli [name] is a 78 y.o. female here today for follow up visit.    She reports that she is doing pretty well..   She continues on tresiba  for management of diabetes.  Her A1c today is increased some since last time.  She been on vacation recently and plans to make changes to her diet weeks..    Seeing sports medicine for chronic knee pain.  Has had injections and has meloxicam  prn.   BP is well controlled with current medications.  Denies side effects.  She has not had chest pain, shortness of breath, palpitations, headache or vision changes.    Fluoxetine  remains effective for mood.    ROS:  A comprehensive ROS was completed and negative except as noted per HPI  No Known Allergies  Past Medical History:  Diagnosis Date   Anxiety    Arthritis    Borderline diabetic    Colon cancer (HCC)    colon ca dx 02/2009, lung cancer   Depression    Depression with anxiety 09/19/2011   Diabetes mellitus type 2, uncomplicated (HCC) 12/31/2014   Genetic testing 06/06/2022   H/O colon cancer, stage II 09/19/2011   T3N0  Cecum Sept 2010   High cholesterol    Hyperlipidemia 09/19/2011   Lung nodules 09/19/2011   granulomas   Osteoporosis 2022   Respiratory illness with fever 02/28/2023   Urinary urgency     Past Surgical History:  Procedure Laterality Date   APPENDECTOMY     When part of colon was removed   AUGMENTATION MAMMAPLASTY     BREAST LUMPECTOMY WITH RADIOACTIVE SEED LOCALIZATION Right 03/22/2022   Procedure: RIGHT BREAST LUMPECTOMY WITH RADIOACTIVE SEED LOCALIZATION;  Surgeon: Aron Shoulders, MD;  Location: [facility];  Service: General;  Laterality: Right;   BREAST SURGERY  1978   augmentation   COLON SURGERY     2010   CRYO INTERCOSTAL NERVE BLOCK N/A 10/05/2014   Procedure: CRYO INTERCOSTAL NERVE BLOCK;   Surgeon: Elspeth JAYSON Millers, MD;  Location: [facility] OR;  Service: Thoracic;  Laterality: N/A;   EYE SURGERY     cataracts   LYMPH NODE DISSECTION Right 10/05/2014   Procedure: LYMPH NODE DISSECTION;  Surgeon: Elspeth JAYSON Millers, MD;  [facility];  Service: Thoracic;  Laterality: Right;   ORIF HUMERUS FRACTURE Left 10/01/2018   Procedure: OPEN REDUCTION INTERNAL FIXATION (ORIF) LEFT PROXIMAL HUMERUS FRACTURE;  Surgeon: Addie Cordella Hamilton, MD;  Location: [facility] OR;  Service: Orthopedics;  Laterality: Left;   PARTIAL COLECTOMY Right 2010   SEGMENTECOMY Right 10/05/2014   Procedure: right lower lobe SEGMENTECTOMY;  Surgeon: Elspeth JAYSON Millers, MD;  Location: [facility] OR;  Service: Thoracic;  Laterality: Right;   VIDEO ASSISTED THORACOSCOPY (VATS)/WEDGE RESECTION Right 10/05/2014   Procedure: Right VIDEO ASSISTED THORACOSCOPY (VATS) with right lower lobe lung nodule WEDGE RESECTION;  Surgeon: Elspeth JAYSON Millers, MD;  Location: [facility] OR;  Service: Thoracic;  Laterality: Right;    Social History   Socioeconomic History   Marital status: Divorced    Spouse name: Not on file   Number of children: 0   Years of education: 16   Highest education level: Bachelor's degree (e.g., BA, AB, BS)  Occupational History    Comment: Retired.  Tobacco Use   Smoking status: Former    Current packs/day:  0.00    Average packs/day: 3.0 packs/day for 48.7 years (146.0 ttl pk-yrs)    Types: Cigarettes    Start date: 18    Quit date: 02/06/2009    Years since quitting: 14.8   Smokeless tobacco: Never  Vaping Use   Vaping status: Never Used  Substance and Sexual Activity   Alcohol use: Yes    Alcohol/week: 2.0 standard drinks of alcohol    Types: 2 Cans of beer per week    Comment: 1-2 beers every other day   Drug use: Yes    Types: Marijuana    Comment: occasionally   Sexual activity: Not Currently    Birth control/protection: Post-menopausal  Other Topics Concern   Not on file  Social History Narrative    Lives alone with her puppy. She does not have any children. She enjoys reading, stream movies & series and swimming.   Right handed   One story home   Social Drivers of Health   Financial Resource Strain: Low Risk  (11/22/2023)   Overall Financial Resource Strain (CARDIA)    Difficulty of Paying Living Expenses: Not hard at all  Food Insecurity: No Food Insecurity (11/22/2023)   Hunger Vital Sign    Worried About Running Out of Food in the Last Year: Never true    Ran Out of Food in the Last Year: Never true  Transportation Needs: No Transportation Needs (11/22/2023)   PRAPARE - Administrator, Civil Service (Medical): No    Lack of Transportation (Non-Medical): No  Physical Activity: Insufficiently Active (11/22/2023)   Exercise Vital Sign    Days of Exercise per Week: 1 day    Minutes of Exercise per Session: 20 min  Stress: No Stress Concern Present (11/22/2023)   [employer] of Occupational Health - Occupational Stress Questionnaire    Feeling of Stress: Not at all  Social Connections: Socially Isolated (11/22/2023)   Social Connection and Isolation Panel    Frequency of Communication with Friends and Family: More than three times a week    Frequency of Social Gatherings with Friends and Family: Twice a week    Attends Religious Services: Patient declined    Database administrator or Organizations: Patient declined    Attends Banker Meetings: Never    Marital Status: Divorced    Family History  Problem Relation Age of Onset   Heart disease Mother    Colon polyps Sister    Lung cancer Sister    Heart disease Brother    Cancer Brother        unk type   Breast cancer Niece    Breast cancer Niece    Other Niece        gene +, possibly BRCA   Colon cancer Neg Hx    Esophageal cancer Neg Hx    Stomach cancer Neg Hx    Rectal cancer Neg Hx     Health Maintenance  Topic Date Due   OPHTHALMOLOGY EXAM  07/20/2023   COVID-19 Vaccine (7 -  Pfizer risk 2024-25 season) 08/21/2023   Diabetic kidney evaluation - eGFR measurement  10/02/2023   INFLUENZA VACCINE  01/04/2024   HEMOGLOBIN A1C  05/30/2024   Medicare Annual Wellness (AWV)  11/21/2024   Diabetic kidney evaluation - Urine ACR  11/28/2024   FOOT EXAM  11/28/2024   DEXA SCAN  12/05/2024   Colonoscopy  01/29/2025   DTaP/Tdap/Td (2 - Td or Tdap) 10/01/2028   Pneumococcal Vaccine: 50+  Years  Completed   Hepatitis C Screening  Completed   Zoster Vaccines- Shingrix   Completed   Hepatitis B Vaccines  Aged Out   HPV VACCINES  Aged Out   Meningococcal B Vaccine  Aged Out     ----------------------------------------------------------------------------------------------------------------------------------------------------------------------------------------------------------------- Physical Exam BP 126/72 (BP Location: Left Arm, Patient Position: Sitting, Cuff Size: Normal)   Pulse 75   Ht 5' 8 (1.727 m)   Wt 179 lb (81.2 kg)   LMP  (LMP Unknown)   SpO2 98%   BMI 27.22 kg/m   Physical Exam Constitutional:      Appearance: Normal appearance.   Eyes:     General: No scleral icterus.   Cardiovascular:     Rate and Rhythm: Normal rate and regular rhythm.  Pulmonary:     Effort: Pulmonary effort is normal.     Breath sounds: Normal breath sounds.   Musculoskeletal:     Cervical back: Neck supple.   Neurological:     Mental Status: She is alert.   Psychiatric:        Mood and Affect: Mood normal.        Behavior: Behavior normal.     ------------------------------------------------------------------------------------------------------------------------------------------------------------------------------------------------------------------- Assessment and Plan  Diabetes mellitus type 2, uncomplicated (HCC) Diabetes control has worsened some since last visit.  Continue Tresiba .  Encouraged to make dietary changes.  Follow-up in 3 to 4  months.  Depression with anxiety She is doing well fluoxetine  at current strength.  Will plan to continue gabapentin  for anxiety and insomnia.  Hyperlipidemia Tolerating pravastatin  well at current strength.  Will plan to continue.  OAB (overactive bladder) Myrbetriq  has not really been effective for her.  Trial of Vesicare  sent in.   Meds ordered this encounter  Medications   solifenacin  (VESICARE ) 5 MG tablet    Sig: Take 1 tablet (5 mg total) by mouth daily.    Dispense:  90 tablet    Refill:  3    Return in about 4 months (around 03/30/2024) for Type 2 Diabetes.

## 2023-11-30 DIAGNOSIS — H11441 Conjunctival cysts, right eye: Secondary | ICD-10-CM | POA: Diagnosis not present

## 2023-11-30 DIAGNOSIS — H43813 Vitreous degeneration, bilateral: Secondary | ICD-10-CM | POA: Diagnosis not present

## 2023-11-30 DIAGNOSIS — E119 Type 2 diabetes mellitus without complications: Secondary | ICD-10-CM | POA: Diagnosis not present

## 2023-11-30 DIAGNOSIS — H35361 Drusen (degenerative) of macula, right eye: Secondary | ICD-10-CM | POA: Diagnosis not present

## 2023-11-30 DIAGNOSIS — H524 Presbyopia: Secondary | ICD-10-CM | POA: Diagnosis not present

## 2023-11-30 LAB — HM DIABETES EYE EXAM

## 2023-12-02 ENCOUNTER — Encounter: Payer: Self-pay | Admitting: Family Medicine

## 2023-12-02 NOTE — Assessment & Plan Note (Signed)
She is doing well fluoxetine at current strength.  Will plan to continue gabapentin for anxiety and insomnia.

## 2023-12-02 NOTE — Assessment & Plan Note (Signed)
Tolerating pravastatin well at current strength.  Will plan to continue.

## 2023-12-02 NOTE — Assessment & Plan Note (Signed)
 Myrbetriq  has not really been effective for her.  Trial of Vesicare  sent in.

## 2023-12-02 NOTE — Assessment & Plan Note (Signed)
 Diabetes control has worsened some since last visit.  Continue Tresiba .  Encouraged to make dietary changes.  Follow-up in 3 to 4 months.

## 2023-12-05 ENCOUNTER — Other Ambulatory Visit: Payer: Self-pay | Admitting: Family Medicine

## 2023-12-20 ENCOUNTER — Other Ambulatory Visit: Payer: Self-pay | Admitting: Family Medicine

## 2024-01-10 DIAGNOSIS — L820 Inflamed seborrheic keratosis: Secondary | ICD-10-CM | POA: Diagnosis not present

## 2024-01-20 ENCOUNTER — Other Ambulatory Visit: Payer: Self-pay | Admitting: Family Medicine

## 2024-01-20 DIAGNOSIS — F418 Other specified anxiety disorders: Secondary | ICD-10-CM

## 2024-01-27 ENCOUNTER — Other Ambulatory Visit: Payer: Self-pay | Admitting: Family Medicine

## 2024-01-27 DIAGNOSIS — F418 Other specified anxiety disorders: Secondary | ICD-10-CM

## 2024-02-05 ENCOUNTER — Other Ambulatory Visit: Payer: Self-pay

## 2024-02-05 ENCOUNTER — Encounter: Payer: Self-pay | Admitting: Sports Medicine

## 2024-02-05 ENCOUNTER — Ambulatory Visit: Admitting: Family Medicine

## 2024-02-05 VITALS — BP 158/82 | HR 78 | Ht 68.0 in | Wt 180.0 lb

## 2024-02-05 DIAGNOSIS — M25511 Pain in right shoulder: Secondary | ICD-10-CM

## 2024-02-05 DIAGNOSIS — M170 Bilateral primary osteoarthritis of knee: Secondary | ICD-10-CM | POA: Diagnosis not present

## 2024-02-05 DIAGNOSIS — G8929 Other chronic pain: Secondary | ICD-10-CM | POA: Diagnosis not present

## 2024-02-05 DIAGNOSIS — M25562 Pain in left knee: Secondary | ICD-10-CM | POA: Diagnosis not present

## 2024-02-05 DIAGNOSIS — M25561 Pain in right knee: Secondary | ICD-10-CM | POA: Diagnosis not present

## 2024-02-05 MED ORDER — TRIAMCINOLONE ACETONIDE 32 MG IX SRER
32.0000 mg | Freq: Once | INTRA_ARTICULAR | Status: AC
Start: 2024-02-05 — End: 2024-02-05
  Administered 2024-02-05: 32 mg via INTRA_ARTICULAR

## 2024-02-05 MED ORDER — TRIAMCINOLONE ACETONIDE 32 MG IX SRER
32.0000 mg | Freq: Once | INTRA_ARTICULAR | Status: AC
  Administered 2024-02-05: 32 mg via INTRA_ARTICULAR

## 2024-02-05 NOTE — Progress Notes (Signed)
 [name], PhD, LAT, ATC acting as a scribe for Artist Lloyd, MD.  Kelli [name] is a 78 y.o. female who presents to [facility] today for exacerbation of her bilat knee pain. Pt was last seen for her knees on 09/25/23 and was given bilat Zilretta  injections.  Today, pt reports she attended a [employer] football game last week and was hardly walk up the stairs. Prior to this bilat knees were feeling pretty good. R shoulder is also painful.   She is also wondering about her R lung to see if there is congestion.  Dx imaging: 06/19/23 L shoulder XR 12/14/22 R & L knee XR   Pertinent review of systems: No fevers or chills  Relevant historical information: History of right inferior partial lobectomy from lung cancer in 2016.   Exam:  BP (!) 158/82   Pulse 78   Ht 5' 8 (1.727 m)   Wt 180 lb (81.6 kg)   LMP  (LMP Unknown)   SpO2 98%   BMI 27.37 kg/m  General: Well Developed, well nourished, and in no acute distress.    Chest wall: Normal appearing no rash or induration.  Not particular tender to palpation. Lungs clear to auscultation bilaterally.  MSK: Right shoulder normal.  Normal motion pain with abduction.  Intact strength.  Knees bilaterally mild effusion normal.  Otherwise normal motion.      Lab and Radiology Results  Procedure: Real-time Ultrasound Guided Injection of right shoulder glenohumeral joint posterior approach Device: Philips Affiniti 50G/GE Logiq Images permanently stored and available for review in PACS Verbal informed consent obtained.  Discussed risks and benefits of procedure. Warned about infection, bleeding, hyperglycemia damage to structures among others. Patient expresses understanding and agreement Time-out conducted.   Noted no overlying erythema, induration, or other signs of local infection.   Skin prepped in a sterile fashion.   Local anesthesia: Topical Ethyl chloride.   With sterile technique and under  real time ultrasound guidance: 40 mg of Kenalog  and 2 mL of Marcaine  injected into shoulder joint. Fluid seen entering the joint capsule.   Completed without difficulty   Pain immediately resolved suggesting accurate placement of the medication.   Advised to call if fevers/chills, erythema, induration, drainage, or persistent bleeding.   Images permanently stored and available for review in the ultrasound unit.  Impression: Technically successful ultrasound guided injection.     Zilretta  injection bilateral knee Procedure: Real-time Ultrasound Guided Injection of right knee joint superior lateral patellar space Device: Philips Affiniti 50G Images permanently stored and available for review in PACS Verbal informed consent obtained.  Discussed risks and benefits of procedure. Warned about infection, hyperglycemia bleeding, damage to structures among others. Patient expresses understanding and agreement Time-out conducted.   Noted no overlying erythema, induration, or other signs of local infection.   Skin prepped in a sterile fashion.   Local anesthesia: Topical Ethyl chloride.   With sterile technique and under real time ultrasound guidance: Zilretta  32 mg injected into knee joint. Fluid seen entering the joint capsule.   Completed without difficulty   Advised to call if fevers/chills, erythema, induration, drainage, or persistent bleeding.   Images permanently stored and available for review in the ultrasound unit.  Impression: Technically successful ultrasound guided injection.   Procedure: Real-time Ultrasound Guided Injection of left knee joint superior lateral patellar space Device: Philips Affiniti 50G Images permanently stored and available for review in PACS Verbal informed consent obtained.  Discussed risks and  benefits of procedure. Warned about infection, hyperglycemia bleeding, damage to structures among others. Patient expresses understanding and agreement Time-out  conducted.   Noted no overlying erythema, induration, or other signs of local infection.   Skin prepped in a sterile fashion.   Local anesthesia: Topical Ethyl chloride.   With sterile technique and under real time ultrasound guidance: Zilretta  32 mg injected into knee joint. Fluid seen entering the joint capsule.   Completed without difficulty   Advised to call if fevers/chills, erythema, induration, drainage, or persistent bleeding.   Images permanently stored and available for review in the ultrasound unit.  Impression: Technically successful ultrasound guided injection.  Lot number: [id number]     Assessment and Plan: 78 y.o. female with bilateral knee pain due to DJD.  Plan for Zilretta  injection.  Right shoulder pain due to DJD and impingement plan for steroid injection.  Recheck back as needed.   PDMP not reviewed this encounter. Orders Placed This Encounter  Procedures   US  LIMITED JOINT SPACE STRUCTURES LOW BILAT(NO LINKED CHARGES)    Reason for Exam (SYMPTOM  OR DIAGNOSIS REQUIRED):   bilateral knee pain    Preferred imaging location?:   [facility] ordered this encounter  Medications   Triamcinolone  Acetonide (ZILRETTA ) intra-articular injection 32 mg   Triamcinolone  Acetonide (ZILRETTA ) intra-articular injection 32 mg     Discussed warning signs or symptoms. Please see discharge instructions. Patient expresses understanding.   The above documentation has been reviewed and is accurate and complete Artist Lloyd, M.D.

## 2024-02-05 NOTE — Telephone Encounter (Signed)
 Please re-auth Zilretta , BILAT knees

## 2024-02-05 NOTE — Patient Instructions (Signed)
 Thank you for coming in today.   You received an injection today. Seek immediate medical attention if the joint becomes red, extremely painful, or is oozing fluid. You received an injection today. Seek immediate medical attention if the joint becomes red, extremely painful, or is oozing fluid.

## 2024-02-06 NOTE — Telephone Encounter (Signed)
 Ran benefits for Zilretta  bilateral knee case number [id number]

## 2024-02-08 ENCOUNTER — Telehealth: Payer: Self-pay

## 2024-02-08 NOTE — Telephone Encounter (Signed)
 Can you schedule patient when medication is in stock thank you   Zilretta  authorized for bilateral knee Coinsurance 80% Copay $5 Deductible does not apply OOP MAX $2400 has met $458.69 Once OOP has been met coverage goes to 100% NO PRE CERT REQUIRED  Reference # [id number]

## 2024-02-11 NOTE — Telephone Encounter (Signed)
 Zilretta  given on 9/2.

## 2024-02-20 ENCOUNTER — Encounter: Payer: Self-pay | Admitting: Family Medicine

## 2024-02-20 ENCOUNTER — Ambulatory Visit (INDEPENDENT_AMBULATORY_CARE_PROVIDER_SITE_OTHER): Admitting: Family Medicine

## 2024-02-20 VITALS — BP 114/78 | HR 72 | Ht 68.0 in | Wt 175.0 lb

## 2024-02-20 DIAGNOSIS — Z23 Encounter for immunization: Secondary | ICD-10-CM

## 2024-02-20 DIAGNOSIS — L720 Epidermal cyst: Secondary | ICD-10-CM | POA: Diagnosis not present

## 2024-02-20 DIAGNOSIS — R1319 Other dysphagia: Secondary | ICD-10-CM

## 2024-02-20 DIAGNOSIS — R1310 Dysphagia, unspecified: Secondary | ICD-10-CM | POA: Insufficient documentation

## 2024-02-20 MED ORDER — PANTOPRAZOLE SODIUM 40 MG PO TBEC: 40.0000 mg | DELAYED_RELEASE_TABLET | Freq: Every day | ORAL | 3 refills | Status: AC

## 2024-02-20 MED ORDER — SUCRALFATE 1 G PO TABS: 1.0000 g | ORAL_TABLET | Freq: Four times a day (QID) | ORAL | 0 refills | Status: DC

## 2024-02-20 MED ORDER — ONDANSETRON 4 MG PO TBDP: 4.0000 mg | ORAL_TABLET | Freq: Three times a day (TID) | ORAL | 0 refills | Status: AC | PRN

## 2024-02-20 NOTE — Assessment & Plan Note (Signed)
Referral placed to plastic surgery.  °

## 2024-02-20 NOTE — Progress Notes (Signed)
 Kelli [name] - 78 y.o. female MRN [id number]  Date of birth: Nov 20, 1945  Subjective Chief Complaint  Patient presents with   Chest Pain   Abdominal Pain    HPI Kelli [name] is a 78 y.o. female here today with complaint of dysphagia as well as burning sensation in her chest and upper abdomen.  This felt to get stuck after eating.  She has some nausea associated with this as well.  Denies chest tightness or shortness of breath.  Bowels are moving normally.  She is concerned because she had similar feeling in her epigastric area when she was diagnosed with colon cancer.  She has a cyst on her left shoulder as well as elbow that she would like to have removed.  Was not pleased with cosmetic results from dermatologist when lesions were removed in the past.  ROS:  A comprehensive ROS was completed and negative except as noted per HPI  No Known Allergies  Past Medical History:  Diagnosis Date   Anxiety    Arthritis    Borderline diabetic    Colon cancer (HCC)    colon ca dx 02/2009, lung cancer   Depression    Depression with anxiety 09/19/2011   Diabetes mellitus type 2, uncomplicated (HCC) 12/31/2014   Genetic testing 06/06/2022   H/O colon cancer, stage II 09/19/2011   T3N0  Cecum Sept 2010   High cholesterol    Hyperlipidemia 09/19/2011   Lung nodules 09/19/2011   granulomas   Osteoporosis 2022   Respiratory illness with fever 02/28/2023   Urinary urgency     Past Surgical History:  Procedure Laterality Date   APPENDECTOMY     When part of colon was removed   AUGMENTATION MAMMAPLASTY     BREAST LUMPECTOMY WITH RADIOACTIVE SEED LOCALIZATION Right 03/22/2022   Procedure: RIGHT BREAST LUMPECTOMY WITH RADIOACTIVE SEED LOCALIZATION;  Surgeon: Aron Shoulders, MD;  Location: [facility];  Service: General;  Laterality: Right;   BREAST SURGERY  1978   augmentation   COLON SURGERY     2010   CRYO INTERCOSTAL NERVE BLOCK N/A 10/05/2014   Procedure: CRYO INTERCOSTAL  NERVE BLOCK;  Surgeon: Elspeth JAYSON Millers, MD;  Location: [facility] OR;  Service: Thoracic;  Laterality: N/A;   EYE SURGERY     cataracts   LYMPH NODE DISSECTION Right 10/05/2014   Procedure: LYMPH NODE DISSECTION;  Surgeon: Elspeth JAYSON Millers, MD;  Location: [facility] OR;  Service: Thoracic;  Laterality: Right;   ORIF HUMERUS FRACTURE Left 10/01/2018   Procedure: OPEN REDUCTION INTERNAL FIXATION (ORIF) LEFT PROXIMAL HUMERUS FRACTURE;  Surgeon: Addie Cordella Hamilton, MD;  Location: [facility] OR;  Service: Orthopedics;  Laterality: Left;   PARTIAL COLECTOMY Right 2010   SEGMENTECOMY Right 10/05/2014   Procedure: right lower lobe SEGMENTECTOMY;  Surgeon: Elspeth JAYSON Millers, MD;  Location: [facility] OR;  Service: Thoracic;  Laterality: Right;   VIDEO ASSISTED THORACOSCOPY (VATS)/WEDGE RESECTION Right 10/05/2014   Procedure: Right VIDEO ASSISTED THORACOSCOPY (VATS) with right lower lobe lung nodule WEDGE RESECTION;  Surgeon: Elspeth JAYSON Millers, MD;  Location: [facility] OR;  Service: Thoracic;  Laterality: Right;    Social History   Socioeconomic History   Marital status: Divorced    Spouse name: Not on file   Number of children: 0   Years of education: 16   Highest education level: Bachelor's degree (e.g., BA, AB, BS)  Occupational History    Comment: Retired.  Tobacco Use   Smoking status: Former    Current  packs/day: 0.00    Average packs/day: 3.0 packs/day for 48.7 years (146.0 ttl pk-yrs)    Types: Cigarettes    Start date: 2    Quit date: 02/06/2009    Years since quitting: 15.0   Smokeless tobacco: Never  Vaping Use   Vaping status: Never Used  Substance and Sexual Activity   Alcohol use: Yes    Alcohol/week: 2.0 standard drinks of alcohol    Types: 2 Cans of beer per week    Comment: 1-2 beers every other day   Drug use: Yes    Types: Marijuana    Comment: occasionally   Sexual activity: Not Currently    Birth control/protection: Post-menopausal  Other Topics Concern   Not on file  Social  History Narrative   Lives alone with her puppy. She does not have any children. She enjoys reading, stream movies & series and swimming.   Right handed   One story home   Social Drivers of Health   Financial Resource Strain: Low Risk  (11/22/2023)   Overall Financial Resource Strain (CARDIA)    Difficulty of Paying Living Expenses: Not hard at all  Food Insecurity: No Food Insecurity (11/22/2023)   Hunger Vital Sign    Worried About Running Out of Food in the Last Year: Never true    Ran Out of Food in the Last Year: Never true  Transportation Needs: No Transportation Needs (11/22/2023)   PRAPARE - Administrator, Civil Service (Medical): No    Lack of Transportation (Non-Medical): No  Physical Activity: Insufficiently Active (11/22/2023)   Exercise Vital Sign    Days of Exercise per Week: 1 day    Minutes of Exercise per Session: 20 min  Stress: No Stress Concern Present (11/22/2023)   [employer] of Occupational Health - Occupational Stress Questionnaire    Feeling of Stress: Not at all  Social Connections: Socially Isolated (11/22/2023)   Social Connection and Isolation Panel    Frequency of Communication with Friends and Family: More than three times a week    Frequency of Social Gatherings with Friends and Family: Twice a week    Attends Religious Services: Patient declined    Database administrator or Organizations: Patient declined    Attends Banker Meetings: Never    Marital Status: Divorced    Family History  Problem Relation Age of Onset   Heart disease Mother    Colon polyps Sister    Lung cancer Sister    Heart disease Brother    Cancer Brother        unk type   Breast cancer Niece    Breast cancer Niece    Other Niece        gene +, possibly BRCA   Colon cancer Neg Hx    Esophageal cancer Neg Hx    Stomach cancer Neg Hx    Rectal cancer Neg Hx     Health Maintenance  Topic Date Due   Diabetic kidney evaluation - eGFR  measurement  10/02/2023   Influenza Vaccine  01/04/2024   COVID-19 Vaccine (7 - Pfizer risk 2024-25 season) 02/04/2024   HEMOGLOBIN A1C  05/30/2024   Medicare Annual Wellness (AWV)  11/21/2024   Diabetic kidney evaluation - Urine ACR  11/28/2024   FOOT EXAM  11/28/2024   OPHTHALMOLOGY EXAM  11/29/2024   DEXA SCAN  12/05/2024   Colonoscopy  01/29/2025   DTaP/Tdap/Td (2 - Td or Tdap) 10/01/2028   Pneumococcal Vaccine:  50+ Years  Completed   Hepatitis C Screening  Completed   Zoster Vaccines- Shingrix   Completed   HPV VACCINES  Aged Out   Meningococcal B Vaccine  Aged Out   Mammogram  Discontinued     ----------------------------------------------------------------------------------------------------------------------------------------------------------------------------------------------------------------- Physical Exam BP 114/78 (BP Location: Left Arm, Patient Position: Sitting, Cuff Size: Normal)   Pulse 72   Ht 5' 8 (1.727 m)   Wt 175 lb (79.4 kg)   LMP  (LMP Unknown)   SpO2 96%   BMI 26.61 kg/m   Physical Exam Constitutional:      Appearance: She is well-developed.  Cardiovascular:     Rate and Rhythm: Normal rate and regular rhythm.  Pulmonary:     Effort: Pulmonary effort is normal.     Breath sounds: Normal breath sounds.  Abdominal:     General: There is no distension.     Palpations: Abdomen is soft.     Tenderness: There is no abdominal tenderness.  Neurological:     Mental Status: She is alert.     ------------------------------------------------------------------------------------------------------------------------------------------------------------------------------------------------------------------- Assessment and Plan  Dysphagia She has esophageal dysphagia.  Will add pantoprazole  and Carafate .  Start Zofran  as needed.  If worsening we will plan to obtain CT and place referral to neurology.  Cyst of skin and subcutaneous tissue Referral placed  to plastic surgery   Meds ordered this encounter  Medications   pantoprazole  (PROTONIX ) 40 MG tablet    Sig: Take 1 tablet (40 mg total) by mouth daily.    Dispense:  30 tablet    Refill:  3   sucralfate  (CARAFATE ) 1 g tablet    Sig: Take 1 tablet (1 g total) by mouth 4 (four) times daily.    Dispense:  120 tablet    Refill:  0   ondansetron  (ZOFRAN -ODT) 4 MG disintegrating tablet    Sig: Take 1 tablet (4 mg total) by mouth every 8 (eight) hours as needed for nausea.    Dispense:  20 tablet    Refill:  0    No follow-ups on file.

## 2024-02-20 NOTE — Patient Instructions (Addendum)
-  Start pantoprazole  daily.  -Carafate  with meals for the next 4-5 days.  -Ondansetron  as needed for nausea.

## 2024-02-20 NOTE — Assessment & Plan Note (Signed)
 She has esophageal dysphagia.  Will add pantoprazole  and Carafate .  Start Zofran  as needed.  If worsening we will plan to obtain CT and place referral to neurology.

## 2024-02-25 ENCOUNTER — Ambulatory Visit: Admitting: Family Medicine

## 2024-02-28 ENCOUNTER — Encounter: Payer: Self-pay | Admitting: Family Medicine

## 2024-03-05 ENCOUNTER — Ambulatory Visit (INDEPENDENT_AMBULATORY_CARE_PROVIDER_SITE_OTHER): Admitting: Plastic Surgery

## 2024-03-05 VITALS — BP 111/53 | HR 79 | Ht 68.0 in | Wt 176.5 lb

## 2024-03-05 DIAGNOSIS — Z170 Estrogen receptor positive status [ER+]: Secondary | ICD-10-CM

## 2024-03-05 DIAGNOSIS — L989 Disorder of the skin and subcutaneous tissue, unspecified: Secondary | ICD-10-CM | POA: Diagnosis not present

## 2024-03-05 DIAGNOSIS — R2231 Localized swelling, mass and lump, right upper limb: Secondary | ICD-10-CM

## 2024-03-05 DIAGNOSIS — R222 Localized swelling, mass and lump, trunk: Secondary | ICD-10-CM | POA: Diagnosis not present

## 2024-03-05 DIAGNOSIS — R2232 Localized swelling, mass and lump, left upper limb: Secondary | ICD-10-CM

## 2024-03-05 NOTE — Progress Notes (Signed)
 Referring Provider Alvia Bring, DO [facility] [address]  [address] [place],  [place] [address]   CC: No chief complaint on file.     Kelli [name] is an 78 y.o. female.  HPI: Ms. Hoeppner is a 78 year old female who is referred for management of several skin lesions specifically 1 on her left back over her scapula 1 on her right elbow and 1 in her left inframammary fold.  These lesions have been there for many years per the patient's history.  She would like to have them removed for pathologic diagnosis.  Of note the patient has been treated successfully for colon cancer, lung cancer, DCIS.  She is currently on anastrozole .  She is not on any blood thinners and has no history of DVT.  She denies any shortness of breath or chest pain with normal daily activities including walking.  She is diabetic however this is managed by her primary care physician and is felt to be an acceptable control.  No Known Allergies  Outpatient Encounter Medications as of 03/05/2024  Medication Sig Note   anastrozole  (ARIMIDEX ) 1 MG tablet TAKE 1 TABLET BY MOUTH EVERY DAY    B-D UF III MINI PEN NEEDLES 31G X 5 MM MISC USE AS DIRECTED    Blood Glucose Monitoring Suppl (ONE TOUCH ULTRA 2) w/Device KIT Check glucose 3 times daily.    Calcium  Carb-Cholecalciferol (CALCIUM  1000 + D) 1000-20 MG-MCG TABS Take 1 tablet by mouth daily.    Cholecalciferol (VITAMIN D ) 2000 UNITS tablet Take 2,000 Units by mouth daily.    cloNIDine  (CATAPRES ) 0.1 MG tablet TAKE 1 TABLET BY MOUTH EVERY DAY    cyanocobalamin  1000 MCG tablet Take 1,000 mcg by mouth daily.    FLUoxetine  (PROZAC ) 40 MG capsule TAKE 1 CAPSULE BY MOUTH EVERY DAY    gabapentin  (NEURONTIN ) 300 MG capsule TAKE 1 CAPSULE (300 MG TOTAL) BY MOUTH AT BEDTIME AS NEEDED FOR INSOMNIA    glucose blood (ONETOUCH ULTRA) test strip Check glucose 3 times daily.    insulin  degludec (TRESIBA  FLEXTOUCH) 200 UNIT/ML FlexTouch Pen INJECT 35 UNITS TWICE DAILY    Lancets (ONETOUCH  DELICA PLUS LANCET33G) MISC TEST BLOOD SUGARS DAILY    meloxicam  (MOBIC ) 7.5 MG tablet TAKE 1 TABLET (7.5 MG TOTAL) BY MOUTH EVERY 12 (TWELVE) HOURS AS NEEDED FOR PAIN. 10/16/2023: Only takes at bedtime if needed   ondansetron  (ZOFRAN -ODT) 4 MG disintegrating tablet Take 1 tablet (4 mg total) by mouth every 8 (eight) hours as needed for nausea.    pantoprazole  (PROTONIX ) 40 MG tablet Take 1 tablet (40 mg total) by mouth daily.    pravastatin  (PRAVACHOL ) 80 MG tablet TAKE 1 TABLET BY MOUTH EVERY DAY    solifenacin  (VESICARE ) 5 MG tablet Take 1 tablet (5 mg total) by mouth daily.    sucralfate  (CARAFATE ) 1 g tablet Take 1 tablet (1 g total) by mouth 4 (four) times daily.    tiZANidine  (ZANAFLEX ) 4 MG tablet TAKE 1 TABLET BY MOUTH EVERY 6 HOURS AS NEEDED FOR MUSCLE SPASMS.    ZILRETTA  32 MG SRER intra-articular injection     No facility-administered encounter medications on file as of 03/05/2024.     Past Medical History:  Diagnosis Date   Anxiety    Arthritis    Borderline diabetic    Colon cancer (HCC)    colon ca dx 02/2009, lung cancer   Depression    Depression with anxiety 09/19/2011   Diabetes mellitus type 2, uncomplicated (HCC)  12/31/2014   Genetic testing 06/06/2022   H/O colon cancer, stage II 09/19/2011   T3N0  Cecum Sept 2010   High cholesterol    Hyperlipidemia 09/19/2011   Lung nodules 09/19/2011   granulomas   Osteoporosis 2022   Respiratory illness with fever 02/28/2023   Urinary urgency     Past Surgical History:  Procedure Laterality Date   APPENDECTOMY     When part of colon was removed   AUGMENTATION MAMMAPLASTY     BREAST LUMPECTOMY WITH RADIOACTIVE SEED LOCALIZATION Right 03/22/2022   Procedure: RIGHT BREAST LUMPECTOMY WITH RADIOACTIVE SEED LOCALIZATION;  Surgeon: Aron Shoulders, MD;  Location: [facility];  Service: General;  Laterality: Right;   BREAST SURGERY  1978   augmentation   COLON SURGERY     2010   CRYO INTERCOSTAL NERVE BLOCK N/A  10/05/2014   Procedure: CRYO INTERCOSTAL NERVE BLOCK;  Surgeon: Elspeth JAYSON Millers, MD;  Location: [facility] OR;  Service: Thoracic;  Laterality: N/A;   EYE SURGERY     cataracts   LYMPH NODE DISSECTION Right 10/05/2014   Procedure: LYMPH NODE DISSECTION;  Surgeon: Elspeth JAYSON Millers, MD;  Location: [facility] OR;  Service: Thoracic;  Laterality: Right;   ORIF HUMERUS FRACTURE Left 10/01/2018   Procedure: OPEN REDUCTION INTERNAL FIXATION (ORIF) LEFT PROXIMAL HUMERUS FRACTURE;  Surgeon: Addie Cordella Hamilton, MD;  Location: [facility] OR;  Service: Orthopedics;  Laterality: Left;   PARTIAL COLECTOMY Right 2010   SEGMENTECOMY Right 10/05/2014   Procedure: right lower lobe SEGMENTECTOMY;  Surgeon: Elspeth JAYSON Millers, MD;  Location: [facility] OR;  Service: Thoracic;  Laterality: Right;   VIDEO ASSISTED THORACOSCOPY (VATS)/WEDGE RESECTION Right 10/05/2014   Procedure: Right VIDEO ASSISTED THORACOSCOPY (VATS) with right lower lobe lung nodule WEDGE RESECTION;  Surgeon: Elspeth JAYSON Millers, MD;  Location: [facility] OR;  Service: Thoracic;  Laterality: Right;    Family History  Problem Relation Age of Onset   Heart disease Mother    Colon polyps Sister    Lung cancer Sister    Heart disease Brother    Cancer Brother        unk type   Breast cancer Niece    Breast cancer Niece    Other Niece        gene +, possibly BRCA   Colon cancer Neg Hx    Esophageal cancer Neg Hx    Stomach cancer Neg Hx    Rectal cancer Neg Hx     Social History   Social History Narrative   Lives alone with her puppy. She does not have any children. She enjoys reading, stream movies & series and swimming.   Right handed   One story home     Review of Systems General: Denies fevers, chills, weight loss CV: Denies chest pain, shortness of breath, palpitations Skin: Patient has subcutaneous lesions over the left scapula left inframammary fold and right elbow which she would like to have removed  Physical Exam    03/05/2024    1:27 PM  02/20/2024   11:24 AM 02/05/2024    2:08 PM  Vitals with BMI  Height 5' 8 5' 8 5' 8  Weight 176 lbs 8 oz 175 lbs 180 lbs  BMI 26.84 26.61 27.38  Systolic 111 114 841  Diastolic 53 78 82  Pulse 79 72 78    General:  No acute distress,  Alert and oriented, Non-Toxic, Normal speech and affect Skin: As noted patient has subcutaneous nodules over the right elbow and  left scapula.  There is also a 2 cm subcutaneous mass in the left inframammary fold.  This 1 is most consistent with an epidermal inclusion cyst. Mammogram: Per the patient's report mammogram several months ago was normal.  She has been followed closely since her diagnosis of DCIS in 2023.  Will attempt to obtain a copy of her mammogram. Assessment/Plan Multiple subcutaneous nodules: Patient has multiple subcutaneous nodules the 1 on the back and the 1 on the elbow could easily be removed in the office but the one in the left inframammary fold I believe should be done in the operating room.  Patient does have breast implants and I believe that a more controlled environment with a dose of antibiotics preoperatively will be more reasonable and decrease the risk of subsequent implant infection.  I discussed the procedure at length with the patient including the possible need for additional procedures based on pathology and the risk of implant infection.  She understands these risks and request that I proceed.  Photographs are obtained today with her consent.  Will schedule her for removal of the cyst in the operating room at her request.  Leonce KATHEE Birmingham 03/05/2024, 2:39 PM

## 2024-03-06 ENCOUNTER — Telehealth: Payer: Self-pay

## 2024-03-06 ENCOUNTER — Other Ambulatory Visit: Payer: Self-pay | Admitting: Oncology

## 2024-03-06 NOTE — Addendum Note (Signed)
 Addended by: DEBERA RONAL POUR on: 03/06/2024 08:45 AM   Modules accepted: Orders

## 2024-03-06 NOTE — Telephone Encounter (Signed)
 [facility] requesting location of patient's most recent mammogram.

## 2024-03-17 ENCOUNTER — Ambulatory Visit
Admission: RE | Admit: 2024-03-17 | Discharge: 2024-03-17 | Disposition: A | Discharge status: Home / Self Care | Source: Ambulatory Visit | Attending: Plastic Surgery | Admitting: Plastic Surgery

## 2024-03-17 DIAGNOSIS — R928 Other abnormal and inconclusive findings on diagnostic imaging of breast: Secondary | ICD-10-CM | POA: Diagnosis not present

## 2024-03-17 DIAGNOSIS — Z170 Estrogen receptor positive status [ER+]: Secondary | ICD-10-CM

## 2024-03-19 ENCOUNTER — Encounter

## 2024-03-31 ENCOUNTER — Ambulatory Visit: Admitting: Family Medicine

## 2024-04-01 ENCOUNTER — Encounter: Payer: Self-pay | Admitting: Family Medicine

## 2024-04-01 ENCOUNTER — Ambulatory Visit (INDEPENDENT_AMBULATORY_CARE_PROVIDER_SITE_OTHER): Admitting: Family Medicine

## 2024-04-01 VITALS — BP 121/72 | HR 78 | Ht 68.0 in | Wt 179.0 lb

## 2024-04-01 DIAGNOSIS — E785 Hyperlipidemia, unspecified: Secondary | ICD-10-CM

## 2024-04-01 DIAGNOSIS — R1319 Other dysphagia: Secondary | ICD-10-CM | POA: Diagnosis not present

## 2024-04-01 DIAGNOSIS — Z23 Encounter for immunization: Secondary | ICD-10-CM

## 2024-04-01 DIAGNOSIS — Z85118 Personal history of other malignant neoplasm of bronchus and lung: Secondary | ICD-10-CM

## 2024-04-01 DIAGNOSIS — Z85038 Personal history of other malignant neoplasm of large intestine: Secondary | ICD-10-CM

## 2024-04-01 DIAGNOSIS — E1169 Type 2 diabetes mellitus with other specified complication: Secondary | ICD-10-CM | POA: Diagnosis not present

## 2024-04-01 DIAGNOSIS — C50411 Malignant neoplasm of upper-outer quadrant of right female breast: Secondary | ICD-10-CM | POA: Diagnosis not present

## 2024-04-01 DIAGNOSIS — E119 Type 2 diabetes mellitus without complications: Secondary | ICD-10-CM

## 2024-04-01 DIAGNOSIS — Z170 Estrogen receptor positive status [ER+]: Secondary | ICD-10-CM

## 2024-04-01 LAB — POCT GLYCOSYLATED HEMOGLOBIN (HGB A1C): HbA1c, POC (controlled diabetic range): 7.7 % — AB (ref 0.0–7.0)

## 2024-04-01 NOTE — Assessment & Plan Note (Signed)
 Improved with pantoprazole .  Will plan to continue.

## 2024-04-01 NOTE — Assessment & Plan Note (Signed)
 S/p resection.  No further treatment at this time. Followed by oncology.

## 2024-04-01 NOTE — Progress Notes (Signed)
 Kelli [name] - 78 y.o. female MRN [id number]  Date of birth: Oct 17, 1945  Subjective Chief Complaint  Patient presents with   Immunizations   Diabetes    HPI Kelli [name] is a 78 y.o. female here today for follow up visit.   She reports that she is doing great.   She continues on tresiba  for management of T2DM.  She is doing well with this at current strength.   Denies hypoglycemia.  A1c is improved at 7.7%.  Remains on pravastatin  for HLD.  Has not tolerated higher potency statins.   BP remains well controlled with current medications.  Denies symptoms including chest pain, shortness of breath, palpitations, headache or vision changes.    Dysphagia improved with addition of pantoprazole .  Denies nausea or vomiting.SABRA   Continues to follow with oncology for history of breast, lung and colon cancer.   ROS:  A comprehensive ROS was completed and negative except as noted per HPI    No Known Allergies  Past Medical History:  Diagnosis Date   Anxiety    Arthritis    Borderline diabetic    Colon cancer (HCC)    colon ca dx 02/2009, lung cancer   Depression    Depression with anxiety 09/19/2011   Diabetes mellitus type 2, uncomplicated (HCC) 12/31/2014   Genetic testing 06/06/2022   H/O colon cancer, stage II 09/19/2011   T3N0  Cecum Sept 2010   High cholesterol    Hyperlipidemia 09/19/2011   Lung nodules 09/19/2011   granulomas   Osteoporosis 2022   Respiratory illness with fever 02/28/2023   Urinary urgency     Past Surgical History:  Procedure Laterality Date   APPENDECTOMY     When part of colon was removed   AUGMENTATION MAMMAPLASTY     BREAST BIOPSY Right 02/27/2022   BREAST LUMPECTOMY Right 03/22/2022   BREAST LUMPECTOMY WITH RADIOACTIVE SEED LOCALIZATION Right 03/22/2022   Procedure: RIGHT BREAST LUMPECTOMY WITH RADIOACTIVE SEED LOCALIZATION;  Surgeon: Aron Shoulders, MD;  Location: [facility];  Service: General;  Laterality: Right;   BREAST  SURGERY  1978   augmentation   COLON SURGERY     2010   CRYO INTERCOSTAL NERVE BLOCK N/A 10/05/2014   Procedure: CRYO INTERCOSTAL NERVE BLOCK;  Surgeon: Elspeth JAYSON Millers, MD;  Location: [facility] OR;  Service: Thoracic;  Laterality: N/A;   EYE SURGERY     cataracts   LYMPH NODE DISSECTION Right 10/05/2014   Procedure: LYMPH NODE DISSECTION;  Surgeon: Elspeth JAYSON Millers, MD;  Location: [facility] OR;  Service: Thoracic;  Laterality: Right;   ORIF HUMERUS FRACTURE Left 10/01/2018   Procedure: OPEN REDUCTION INTERNAL FIXATION (ORIF) LEFT PROXIMAL HUMERUS FRACTURE;  Surgeon: Addie Cordella Hamilton, MD;  Location: [facility] OR;  Service: Orthopedics;  Laterality: Left;   PARTIAL COLECTOMY Right 2010   SEGMENTECOMY Right 10/05/2014   Procedure: right lower lobe SEGMENTECTOMY;  Surgeon: Elspeth JAYSON Millers, MD;  Location: [facility] OR;  Service: Thoracic;  Laterality: Right;   VIDEO ASSISTED THORACOSCOPY (VATS)/WEDGE RESECTION Right 10/05/2014   Procedure: Right VIDEO ASSISTED THORACOSCOPY (VATS) with right lower lobe lung nodule WEDGE RESECTION;  Surgeon: Elspeth JAYSON Millers, MD;  Location: [facility] OR;  Service: Thoracic;  Laterality: Right;    Social History   Socioeconomic History   Marital status: Divorced    Spouse name: Not on file   Number of children: 0   Years of education: 16   Highest education level: Bachelor's degree (e.g., BA, AB, BS)  Occupational History    Comment: Retired.  Tobacco Use   Smoking status: Former    Current packs/day: 0.00    Average packs/day: 3.0 packs/day for 48.7 years (146.0 ttl pk-yrs)    Types: Cigarettes    Start date: 63    Quit date: 02/06/2009    Years since quitting: 15.1   Smokeless tobacco: Never  Vaping Use   Vaping status: Never Used  Substance and Sexual Activity   Alcohol use: Yes    Alcohol/week: 2.0 standard drinks of alcohol    Types: 2 Cans of beer per week    Comment: 1-2 beers every other day   Drug use: Yes    Types: Marijuana    Comment:  occasionally   Sexual activity: Not Currently    Birth control/protection: Post-menopausal  Other Topics Concern   Not on file  Social History Narrative   Lives alone with her puppy. She does not have any children. She enjoys reading, stream movies & series and swimming.   Right handed   One story home   Social Drivers of Health   Financial Resource Strain: Low Risk  (03/31/2024)   Overall Financial Resource Strain (CARDIA)    Difficulty of Paying Living Expenses: Not hard at all  Food Insecurity: No Food Insecurity (03/31/2024)   Hunger Vital Sign    Worried About Running Out of Food in the Last Year: Never true    Ran Out of Food in the Last Year: Never true  Transportation Needs: No Transportation Needs (03/31/2024)   PRAPARE - Administrator, Civil Service (Medical): No    Lack of Transportation (Non-Medical): No  Physical Activity: Inactive (03/31/2024)   Exercise Vital Sign    Days of Exercise per Week: 0 days    Minutes of Exercise per Session: Not on file  Stress: No Stress Concern Present (03/31/2024)   [employer] of Occupational Health - Occupational Stress Questionnaire    Feeling of Stress: Not at all  Social Connections: Socially Isolated (03/31/2024)   Social Connection and Isolation Panel    Frequency of Communication with Friends and Family: More than three times a week    Frequency of Social Gatherings with Friends and Family: Twice a week    Attends Religious Services: Never    Database Administrator or Organizations: No    Attends Engineer, Structural: Not on file    Marital Status: Divorced    Family History  Problem Relation Age of Onset   Heart disease Mother    Colon polyps Sister    Lung cancer Sister    Heart disease Brother    Cancer Brother        unk type   Breast cancer Niece    Breast cancer Niece    Other Niece        gene +, possibly BRCA   Colon cancer Neg Hx    Esophageal cancer Neg Hx    Stomach cancer  Neg Hx    Rectal cancer Neg Hx     Health Maintenance  Topic Date Due   Diabetic kidney evaluation - eGFR measurement  10/02/2023   Influenza Vaccine  01/04/2024   COVID-19 Vaccine (7 - 2025-26 season) 05/17/2024 (Originally 02/04/2024)   HEMOGLOBIN A1C  09/30/2024   Medicare Annual Wellness (AWV)  11/21/2024   Diabetic kidney evaluation - Urine ACR  11/28/2024   FOOT EXAM  11/28/2024   OPHTHALMOLOGY EXAM  11/29/2024   DEXA SCAN  12/05/2024   Colonoscopy  01/29/2025   Mammogram  03/17/2025   DTaP/Tdap/Td (2 - Td or Tdap) 10/01/2028   Pneumococcal Vaccine: 50+ Years  Completed   Hepatitis C Screening  Completed   Zoster Vaccines- Shingrix   Completed   Meningococcal B Vaccine  Aged Out     ----------------------------------------------------------------------------------------------------------------------------------------------------------------------------------------------------------------- Physical Exam BP 121/72 (BP Location: Left Arm, Patient Position: Sitting, Cuff Size: Normal)   Pulse 78   Ht 5' 8 (1.727 m)   Wt 179 lb (81.2 kg)   LMP  (LMP Unknown)   SpO2 96%   BMI 27.22 kg/m   Physical Exam Constitutional:      Appearance: Normal appearance.  HENT:     Head: Normocephalic and atraumatic.  Cardiovascular:     Rate and Rhythm: Normal rate and regular rhythm.  Neurological:     Mental Status: She is alert.     ------------------------------------------------------------------------------------------------------------------------------------------------------------------------------------------------------------------- Assessment and Plan  H/O colon cancer, stage II S/p resection.  No further treatment at this time. Followed by oncology.   Personal history of lung cancer S/p wedge rection of lower lung nodule.  No further treatment at this time.   Malignant neoplasm of upper-outer quadrant of right breast in female, estrogen receptor positive  (HCC) Continues on tamoxifen. Followed by oncology.   Dysphagia Improved with pantoprazole .  Will plan to continue.   Hyperlipidemia associated with type 2 diabetes mellitus (HCC) Blood sugars have improved with A1c of 7.7%.  Continue tresiba  at current strength.  Continue dietary changes.  Continue pravastatin  for associated HLD.  Updated lipids ordered.    No orders of the defined types were placed in this encounter.   Return in about 3 months (around 07/02/2024) for Type 2 Diabetes.

## 2024-04-01 NOTE — Assessment & Plan Note (Signed)
 Blood sugars have improved with A1c of 7.7%.  Continue tresiba  at current strength.  Continue dietary changes.  Continue pravastatin  for associated HLD.  Updated lipids ordered.

## 2024-04-01 NOTE — Assessment & Plan Note (Signed)
 Continues on tamoxifen. Followed by oncology.

## 2024-04-01 NOTE — Assessment & Plan Note (Signed)
 S/p wedge rection of lower lung nodule.  No further treatment at this time.

## 2024-04-01 NOTE — Patient Instructions (Signed)
Return for labs when fasting.

## 2024-04-02 ENCOUNTER — Encounter: Payer: Self-pay | Admitting: Family Medicine

## 2024-04-10 ENCOUNTER — Ambulatory Visit: Admitting: Physician Assistant

## 2024-04-10 VITALS — BP 126/72 | HR 76 | Ht 68.0 in | Wt 179.0 lb

## 2024-04-10 DIAGNOSIS — L989 Disorder of the skin and subcutaneous tissue, unspecified: Secondary | ICD-10-CM | POA: Diagnosis not present

## 2024-04-10 NOTE — H&P (View-Only) (Signed)
 Patient ID: Kelli [name], female    DOB: 1946/02/08, 78 y.o.   MRN: [id number]  Chief Complaint  Patient presents with   preop    Excision of skin lesions      ICD-10-CM   1. Skin lesion  L98.9        History of Present Illness: Kelli [name] is a 78 y.o.  female  with a history of skin lesions on left upper back, left inframammary fold, and right elbow.  She presents for preoperative evaluation for upcoming procedure, excision of lesions in the OR, scheduled for 05/02/2024 with Dr. Waddell.  The patient has not had problems with anesthesia.  She denies any personal or family history of blood clots or clotting disorder.  She is a smoker, quit years ago.  She denies any significant cardiac or pulmonary disease, varicosities, or hormone replacement medication.  She can continue her anastrozole  throughout the perioperative period.  She will hold her Mobic  3 to 5 days prior to surgery.  Discussed excision of lesions and she is agreeable to proceed.  Summary of Previous Visit: Patient met with Dr. Waddell for consult 03/05/2024.  She has a history of colon cancer, lung cancer, and DCIS.  She is on anastrozole .  Discussed excision of several lesions involving left upper back, right elbow, and left inframammary fold for pathologic diagnosis.  She was noted to be diabetic, but felt well-controlled enough for the procedure.  Per consult note, the lesions on left upper back and right elbow could be removed here in clinic, but the one in the inframammary fold would be best excised in the operating room.  The surgical route was for the excision of the lesions.  PMH Significant for: Insulin -dependent T2DM with most recent A1c 8.2%, HTN, HLD, colon cancer, SCC, breast cancer, lung cancer, GERD.   Past Medical History: Allergies: No Known Allergies  Current Medications:  Current Outpatient Medications:    anastrozole  (ARIMIDEX ) 1 MG tablet, TAKE 1 TABLET BY MOUTH EVERY DAY, Disp: 90 tablet, Rfl:  3   B-D UF III MINI PEN NEEDLES 31G X 5 MM MISC, USE AS DIRECTED, Disp: 100 each, Rfl: 12   Blood Glucose Monitoring Suppl (ONE TOUCH ULTRA 2) w/Device KIT, Check glucose 3 times daily., Disp: 1 kit, Rfl: 0   Calcium  Carb-Cholecalciferol (CALCIUM  1000 + D) 1000-20 MG-MCG TABS, Take 1 tablet by mouth daily., Disp: 60 tablet, Rfl: 2   Cholecalciferol (VITAMIN D ) 2000 UNITS tablet, Take 2,000 Units by mouth daily., Disp: , Rfl:    cloNIDine  (CATAPRES ) 0.1 MG tablet, TAKE 1 TABLET BY MOUTH EVERY DAY, Disp: 90 tablet, Rfl: 1   cyanocobalamin  1000 MCG tablet, Take 1,000 mcg by mouth daily., Disp: , Rfl:    FLUoxetine  (PROZAC ) 40 MG capsule, TAKE 1 CAPSULE BY MOUTH EVERY DAY, Disp: 30 capsule, Rfl: 5   gabapentin  (NEURONTIN ) 300 MG capsule, TAKE 1 CAPSULE (300 MG TOTAL) BY MOUTH AT BEDTIME AS NEEDED FOR INSOMNIA, Disp: 90 capsule, Rfl: 2   glucose blood (ONETOUCH ULTRA) test strip, Check glucose 3 times daily., Disp: 100 each, Rfl: 12   insulin  degludec (TRESIBA  FLEXTOUCH) 200 UNIT/ML FlexTouch Pen, INJECT 35 UNITS TWICE DAILY, Disp: 3 mL, Rfl: 1   Lancets (ONETOUCH DELICA PLUS LANCET33G) MISC, TEST BLOOD SUGARS DAILY, Disp: 100 each, Rfl: 0   meloxicam  (MOBIC ) 7.5 MG tablet, TAKE 1 TABLET (7.5 MG TOTAL) BY MOUTH EVERY 12 (TWELVE) HOURS AS NEEDED FOR PAIN., Disp: 30 tablet, Rfl: 1  ondansetron  (ZOFRAN -ODT) 4 MG disintegrating tablet, Take 1 tablet (4 mg total) by mouth every 8 (eight) hours as needed for nausea., Disp: 20 tablet, Rfl: 0   pantoprazole  (PROTONIX ) 40 MG tablet, Take 1 tablet (40 mg total) by mouth daily., Disp: 30 tablet, Rfl: 3   pravastatin  (PRAVACHOL ) 80 MG tablet, TAKE 1 TABLET BY MOUTH EVERY DAY, Disp: 90 tablet, Rfl: 2   solifenacin  (VESICARE ) 5 MG tablet, Take 1 tablet (5 mg total) by mouth daily., Disp: 90 tablet, Rfl: 3   sucralfate  (CARAFATE ) 1 g tablet, Take 1 tablet (1 g total) by mouth 4 (four) times daily., Disp: 120 tablet, Rfl: 0   tiZANidine  (ZANAFLEX ) 4 MG tablet, TAKE 1  TABLET BY MOUTH EVERY 6 HOURS AS NEEDED FOR MUSCLE SPASMS., Disp: 30 tablet, Rfl: 1   ZILRETTA  32 MG SRER intra-articular injection, , Disp: , Rfl:   Past Medical Problems: Past Medical History:  Diagnosis Date   Anxiety    Arthritis    Borderline diabetic    Colon cancer (HCC)    colon ca dx 02/2009, lung cancer   Depression    Depression with anxiety 09/19/2011   Diabetes mellitus type 2, uncomplicated (HCC) 12/31/2014   Genetic testing 06/06/2022   H/O colon cancer, stage II 09/19/2011   T3N0  Cecum Sept 2010   High cholesterol    Hyperlipidemia 09/19/2011   Lung nodules 09/19/2011   granulomas   Osteoporosis 2022   Respiratory illness with fever 02/28/2023   Urinary urgency     Past Surgical History: Past Surgical History:  Procedure Laterality Date   APPENDECTOMY     When part of colon was removed   AUGMENTATION MAMMAPLASTY     BREAST BIOPSY Right 02/27/2022   BREAST LUMPECTOMY Right 03/22/2022   BREAST LUMPECTOMY WITH RADIOACTIVE SEED LOCALIZATION Right 03/22/2022   Procedure: RIGHT BREAST LUMPECTOMY WITH RADIOACTIVE SEED LOCALIZATION;  Surgeon: Aron Shoulders, MD;  Location: [facility];  Service: General;  Laterality: Right;   BREAST SURGERY  1978   augmentation   COLON SURGERY     2010   CRYO INTERCOSTAL NERVE BLOCK N/A 10/05/2014   Procedure: CRYO INTERCOSTAL NERVE BLOCK;  Surgeon: Elspeth JAYSON Millers, MD;  Location: [facility] OR;  Service: Thoracic;  Laterality: N/A;   EYE SURGERY     cataracts   LYMPH NODE DISSECTION Right 10/05/2014   Procedure: LYMPH NODE DISSECTION;  Surgeon: Elspeth JAYSON Millers, MD;  Location: [facility] OR;  Service: Thoracic;  Laterality: Right;   ORIF HUMERUS FRACTURE Left 10/01/2018   Procedure: OPEN REDUCTION INTERNAL FIXATION (ORIF) LEFT PROXIMAL HUMERUS FRACTURE;  Surgeon: Addie Cordella Hamilton, MD;  Location: [facility] OR;  Service: Orthopedics;  Laterality: Left;   PARTIAL COLECTOMY Right 2010   SEGMENTECOMY Right 10/05/2014   Procedure:  right lower lobe SEGMENTECTOMY;  Surgeon: Elspeth JAYSON Millers, MD;  Location: [facility] OR;  Service: Thoracic;  Laterality: Right;   VIDEO ASSISTED THORACOSCOPY (VATS)/WEDGE RESECTION Right 10/05/2014   Procedure: Right VIDEO ASSISTED THORACOSCOPY (VATS) with right lower lobe lung nodule WEDGE RESECTION;  Surgeon: Elspeth JAYSON Millers, MD;  Location: [facility] OR;  Service: Thoracic;  Laterality: Right;    Social History: Social History   Socioeconomic History   Marital status: Divorced    Spouse name: Not on file   Number of children: 0   Years of education: 16   Highest education level: Bachelor's degree (e.g., BA, AB, BS)  Occupational History    Comment: Retired.  Tobacco Use   Smoking  status: Former    Current packs/day: 0.00    Average packs/day: 3.0 packs/day for 48.7 years (146.0 ttl pk-yrs)    Types: Cigarettes    Start date: 89    Quit date: 02/06/2009    Years since quitting: 15.1   Smokeless tobacco: Never  Vaping Use   Vaping status: Never Used  Substance and Sexual Activity   Alcohol use: Yes    Alcohol/week: 2.0 standard drinks of alcohol    Types: 2 Cans of beer per week    Comment: 1-2 beers every other day   Drug use: Yes    Types: Marijuana    Comment: occasionally   Sexual activity: Not Currently    Birth control/protection: Post-menopausal  Other Topics Concern   Not on file  Social History Narrative   Lives alone with her puppy. She does not have any children. She enjoys reading, stream movies & series and swimming.   Right handed   One story home   Social Drivers of Health   Financial Resource Strain: Low Risk  (03/31/2024)   Overall Financial Resource Strain (CARDIA)    Difficulty of Paying Living Expenses: Not hard at all  Food Insecurity: No Food Insecurity (03/31/2024)   Hunger Vital Sign    Worried About Running Out of Food in the Last Year: Never true    Ran Out of Food in the Last Year: Never true  Transportation Needs: No Transportation Needs  (03/31/2024)   PRAPARE - Administrator, Civil Service (Medical): No    Lack of Transportation (Non-Medical): No  Physical Activity: Inactive (03/31/2024)   Exercise Vital Sign    Days of Exercise per Week: 0 days    Minutes of Exercise per Session: Not on file  Stress: No Stress Concern Present (03/31/2024)   [employer] of Occupational Health - Occupational Stress Questionnaire    Feeling of Stress: Not at all  Social Connections: Socially Isolated (03/31/2024)   Social Connection and Isolation Panel    Frequency of Communication with Friends and Family: More than three times a week    Frequency of Social Gatherings with Friends and Family: Twice a week    Attends Religious Services: Never    Database Administrator or Organizations: No    Attends Engineer, Structural: Not on file    Marital Status: Divorced  Intimate Partner Violence: Not At Risk (11/22/2023)   Humiliation, Afraid, Rape, and Kick questionnaire    Fear of Current or Ex-Partner: No    Emotionally Abused: No    Physically Abused: No    Sexually Abused: No    Family History: Family History  Problem Relation Age of Onset   Heart disease Mother    Colon polyps Sister    Lung cancer Sister    Heart disease Brother    Cancer Brother        unk type   Breast cancer Niece    Breast cancer Niece    Other Niece        gene +, possibly BRCA   Colon cancer Neg Hx    Esophageal cancer Neg Hx    Stomach cancer Neg Hx    Rectal cancer Neg Hx     Review of Systems: ROS Denies any recent trauma, infection, difficulty breathing, or leg swelling.  Physical Exam: Vital Signs BP 126/72   Pulse 76   Ht 5' 8 (1.727 m)   Wt 179 lb (81.2 kg)   LMP  (  LMP Unknown)   SpO2 96%   BMI 27.22 kg/m   Physical Exam Constitutional:      General: Not in acute distress.    Appearance: Normal appearance. Not ill-appearing.  HENT:     Head: Normocephalic and atraumatic.  Eyes:     Pupils: Pupils  are equal, round. Cardiovascular:     Rate and Rhythm: Normal rate.    Pulses: Normal pulses.  Pulmonary:     Effort: No respiratory distress or increased work of breathing.  Speaks in full sentences. Abdominal:     General: Abdomen is flat. No distension.   Musculoskeletal: Normal range of motion. No lower extremity swelling or edema. No varicosities. Skin:    General: Skin is warm and dry.     Findings: No erythema or rash.  Neurological:     Mental Status: Alert and oriented to person, place, and time.  Psychiatric:        Mood and Affect: Mood normal.        Behavior: Behavior normal.    Assessment/Plan: The patient is scheduled for excision of skin lesions with Dr. Waddell.  Risks, benefits, and alternatives of procedure discussed, questions answered and consent obtained.    Smoking Status: Former smoker.  Last Mammogram: 03/2024; Results: BI-RADS Category 2: Benign.  Caprini Score: 8; Risk Factors include: Age, BMI greater than 25, history of cancer, and length of planned surgery. Recommendation for mechanical and possibly pharmacological prophylaxis.  Will discuss with Dr. Waddell and prescribe Lovenox  if indicated.  Otherwise, will encourage early ambulation.   Pictures obtained: 03/05/2024  Post-op Rx sent to pharmacy: None.  Patient reports that she has hydrocodone  at home (which she prefers over oxycodone ), as well as Zofran .  Patient was provided with the General Surgical Risk consent document and Pain Medication Agreement prior to their appointment.  They had adequate time to read through the risk consent documents and Pain Medication Agreement. We also discussed them in person together during this preop appointment. All of their questions were answered to their satisfaction.  Recommended calling if they have any further questions.  Risk consent form and Pain Medication Agreement to be scanned into patient's chart.    Electronically signed by: Honora Seip, PA-C  04/10/2024 3:06 PM

## 2024-04-10 NOTE — Progress Notes (Signed)
 Patient ID: Kelli [name], female    DOB: 1946/02/08, 78 y.o.   MRN: [id number]  Chief Complaint  Patient presents with   preop    Excision of skin lesions      ICD-10-CM   1. Skin lesion  L98.9        History of Present Illness: Kelli [name] is a 78 y.o.  female  with a history of skin lesions on left upper back, left inframammary fold, and right elbow.  She presents for preoperative evaluation for upcoming procedure, excision of lesions in the OR, scheduled for 05/02/2024 with Dr. Waddell.  The patient has not had problems with anesthesia.  She denies any personal or family history of blood clots or clotting disorder.  She is a smoker, quit years ago.  She denies any significant cardiac or pulmonary disease, varicosities, or hormone replacement medication.  She can continue her anastrozole  throughout the perioperative period.  She will hold her Mobic  3 to 5 days prior to surgery.  Discussed excision of lesions and she is agreeable to proceed.  Summary of Previous Visit: Patient met with Dr. Waddell for consult 03/05/2024.  She has a history of colon cancer, lung cancer, and DCIS.  She is on anastrozole .  Discussed excision of several lesions involving left upper back, right elbow, and left inframammary fold for pathologic diagnosis.  She was noted to be diabetic, but felt well-controlled enough for the procedure.  Per consult note, the lesions on left upper back and right elbow could be removed here in clinic, but the one in the inframammary fold would be best excised in the operating room.  The surgical route was for the excision of the lesions.  PMH Significant for: Insulin -dependent T2DM with most recent A1c 8.2%, HTN, HLD, colon cancer, SCC, breast cancer, lung cancer, GERD.   Past Medical History: Allergies: No Known Allergies  Current Medications:  Current Outpatient Medications:    anastrozole  (ARIMIDEX ) 1 MG tablet, TAKE 1 TABLET BY MOUTH EVERY DAY, Disp: 90 tablet, Rfl:  3   B-D UF III MINI PEN NEEDLES 31G X 5 MM MISC, USE AS DIRECTED, Disp: 100 each, Rfl: 12   Blood Glucose Monitoring Suppl (ONE TOUCH ULTRA 2) w/Device KIT, Check glucose 3 times daily., Disp: 1 kit, Rfl: 0   Calcium  Carb-Cholecalciferol (CALCIUM  1000 + D) 1000-20 MG-MCG TABS, Take 1 tablet by mouth daily., Disp: 60 tablet, Rfl: 2   Cholecalciferol (VITAMIN D ) 2000 UNITS tablet, Take 2,000 Units by mouth daily., Disp: , Rfl:    cloNIDine  (CATAPRES ) 0.1 MG tablet, TAKE 1 TABLET BY MOUTH EVERY DAY, Disp: 90 tablet, Rfl: 1   cyanocobalamin  1000 MCG tablet, Take 1,000 mcg by mouth daily., Disp: , Rfl:    FLUoxetine  (PROZAC ) 40 MG capsule, TAKE 1 CAPSULE BY MOUTH EVERY DAY, Disp: 30 capsule, Rfl: 5   gabapentin  (NEURONTIN ) 300 MG capsule, TAKE 1 CAPSULE (300 MG TOTAL) BY MOUTH AT BEDTIME AS NEEDED FOR INSOMNIA, Disp: 90 capsule, Rfl: 2   glucose blood (ONETOUCH ULTRA) test strip, Check glucose 3 times daily., Disp: 100 each, Rfl: 12   insulin  degludec (TRESIBA  FLEXTOUCH) 200 UNIT/ML FlexTouch Pen, INJECT 35 UNITS TWICE DAILY, Disp: 3 mL, Rfl: 1   Lancets (ONETOUCH DELICA PLUS LANCET33G) MISC, TEST BLOOD SUGARS DAILY, Disp: 100 each, Rfl: 0   meloxicam  (MOBIC ) 7.5 MG tablet, TAKE 1 TABLET (7.5 MG TOTAL) BY MOUTH EVERY 12 (TWELVE) HOURS AS NEEDED FOR PAIN., Disp: 30 tablet, Rfl: 1  ondansetron  (ZOFRAN -ODT) 4 MG disintegrating tablet, Take 1 tablet (4 mg total) by mouth every 8 (eight) hours as needed for nausea., Disp: 20 tablet, Rfl: 0   pantoprazole  (PROTONIX ) 40 MG tablet, Take 1 tablet (40 mg total) by mouth daily., Disp: 30 tablet, Rfl: 3   pravastatin  (PRAVACHOL ) 80 MG tablet, TAKE 1 TABLET BY MOUTH EVERY DAY, Disp: 90 tablet, Rfl: 2   solifenacin  (VESICARE ) 5 MG tablet, Take 1 tablet (5 mg total) by mouth daily., Disp: 90 tablet, Rfl: 3   sucralfate  (CARAFATE ) 1 g tablet, Take 1 tablet (1 g total) by mouth 4 (four) times daily., Disp: 120 tablet, Rfl: 0   tiZANidine  (ZANAFLEX ) 4 MG tablet, TAKE 1  TABLET BY MOUTH EVERY 6 HOURS AS NEEDED FOR MUSCLE SPASMS., Disp: 30 tablet, Rfl: 1   ZILRETTA  32 MG SRER intra-articular injection, , Disp: , Rfl:   Past Medical Problems: Past Medical History:  Diagnosis Date   Anxiety    Arthritis    Borderline diabetic    Colon cancer (HCC)    colon ca dx 02/2009, lung cancer   Depression    Depression with anxiety 09/19/2011   Diabetes mellitus type 2, uncomplicated (HCC) 12/31/2014   Genetic testing 06/06/2022   H/O colon cancer, stage II 09/19/2011   T3N0  Cecum Sept 2010   High cholesterol    Hyperlipidemia 09/19/2011   Lung nodules 09/19/2011   granulomas   Osteoporosis 2022   Respiratory illness with fever 02/28/2023   Urinary urgency     Past Surgical History: Past Surgical History:  Procedure Laterality Date   APPENDECTOMY     When part of colon was removed   AUGMENTATION MAMMAPLASTY     BREAST BIOPSY Right 02/27/2022   BREAST LUMPECTOMY Right 03/22/2022   BREAST LUMPECTOMY WITH RADIOACTIVE SEED LOCALIZATION Right 03/22/2022   Procedure: RIGHT BREAST LUMPECTOMY WITH RADIOACTIVE SEED LOCALIZATION;  Surgeon: Aron Shoulders, MD;  Location: [facility];  Service: General;  Laterality: Right;   BREAST SURGERY  1978   augmentation   COLON SURGERY     2010   CRYO INTERCOSTAL NERVE BLOCK N/A 10/05/2014   Procedure: CRYO INTERCOSTAL NERVE BLOCK;  Surgeon: Elspeth JAYSON Millers, MD;  Location: [facility] OR;  Service: Thoracic;  Laterality: N/A;   EYE SURGERY     cataracts   LYMPH NODE DISSECTION Right 10/05/2014   Procedure: LYMPH NODE DISSECTION;  Surgeon: Elspeth JAYSON Millers, MD;  Location: [facility] OR;  Service: Thoracic;  Laterality: Right;   ORIF HUMERUS FRACTURE Left 10/01/2018   Procedure: OPEN REDUCTION INTERNAL FIXATION (ORIF) LEFT PROXIMAL HUMERUS FRACTURE;  Surgeon: Addie Cordella Hamilton, MD;  Location: [facility] OR;  Service: Orthopedics;  Laterality: Left;   PARTIAL COLECTOMY Right 2010   SEGMENTECOMY Right 10/05/2014   Procedure:  right lower lobe SEGMENTECTOMY;  Surgeon: Elspeth JAYSON Millers, MD;  Location: [facility] OR;  Service: Thoracic;  Laterality: Right;   VIDEO ASSISTED THORACOSCOPY (VATS)/WEDGE RESECTION Right 10/05/2014   Procedure: Right VIDEO ASSISTED THORACOSCOPY (VATS) with right lower lobe lung nodule WEDGE RESECTION;  Surgeon: Elspeth JAYSON Millers, MD;  Location: [facility] OR;  Service: Thoracic;  Laterality: Right;    Social History: Social History   Socioeconomic History   Marital status: Divorced    Spouse name: Not on file   Number of children: 0   Years of education: 16   Highest education level: Bachelor's degree (e.g., BA, AB, BS)  Occupational History    Comment: Retired.  Tobacco Use   Smoking  status: Former    Current packs/day: 0.00    Average packs/day: 3.0 packs/day for 48.7 years (146.0 ttl pk-yrs)    Types: Cigarettes    Start date: 89    Quit date: 02/06/2009    Years since quitting: 15.1   Smokeless tobacco: Never  Vaping Use   Vaping status: Never Used  Substance and Sexual Activity   Alcohol use: Yes    Alcohol/week: 2.0 standard drinks of alcohol    Types: 2 Cans of beer per week    Comment: 1-2 beers every other day   Drug use: Yes    Types: Marijuana    Comment: occasionally   Sexual activity: Not Currently    Birth control/protection: Post-menopausal  Other Topics Concern   Not on file  Social History Narrative   Lives alone with her puppy. She does not have any children. She enjoys reading, stream movies & series and swimming.   Right handed   One story home   Social Drivers of Health   Financial Resource Strain: Low Risk  (03/31/2024)   Overall Financial Resource Strain (CARDIA)    Difficulty of Paying Living Expenses: Not hard at all  Food Insecurity: No Food Insecurity (03/31/2024)   Hunger Vital Sign    Worried About Running Out of Food in the Last Year: Never true    Ran Out of Food in the Last Year: Never true  Transportation Needs: No Transportation Needs  (03/31/2024)   PRAPARE - Administrator, Civil Service (Medical): No    Lack of Transportation (Non-Medical): No  Physical Activity: Inactive (03/31/2024)   Exercise Vital Sign    Days of Exercise per Week: 0 days    Minutes of Exercise per Session: Not on file  Stress: No Stress Concern Present (03/31/2024)   [employer] of Occupational Health - Occupational Stress Questionnaire    Feeling of Stress: Not at all  Social Connections: Socially Isolated (03/31/2024)   Social Connection and Isolation Panel    Frequency of Communication with Friends and Family: More than three times a week    Frequency of Social Gatherings with Friends and Family: Twice a week    Attends Religious Services: Never    Database Administrator or Organizations: No    Attends Engineer, Structural: Not on file    Marital Status: Divorced  Intimate Partner Violence: Not At Risk (11/22/2023)   Humiliation, Afraid, Rape, and Kick questionnaire    Fear of Current or Ex-Partner: No    Emotionally Abused: No    Physically Abused: No    Sexually Abused: No    Family History: Family History  Problem Relation Age of Onset   Heart disease Mother    Colon polyps Sister    Lung cancer Sister    Heart disease Brother    Cancer Brother        unk type   Breast cancer Niece    Breast cancer Niece    Other Niece        gene +, possibly BRCA   Colon cancer Neg Hx    Esophageal cancer Neg Hx    Stomach cancer Neg Hx    Rectal cancer Neg Hx     Review of Systems: ROS Denies any recent trauma, infection, difficulty breathing, or leg swelling.  Physical Exam: Vital Signs BP 126/72   Pulse 76   Ht 5' 8 (1.727 m)   Wt 179 lb (81.2 kg)   LMP  (  LMP Unknown)   SpO2 96%   BMI 27.22 kg/m   Physical Exam Constitutional:      General: Not in acute distress.    Appearance: Normal appearance. Not ill-appearing.  HENT:     Head: Normocephalic and atraumatic.  Eyes:     Pupils: Pupils  are equal, round. Cardiovascular:     Rate and Rhythm: Normal rate.    Pulses: Normal pulses.  Pulmonary:     Effort: No respiratory distress or increased work of breathing.  Speaks in full sentences. Abdominal:     General: Abdomen is flat. No distension.   Musculoskeletal: Normal range of motion. No lower extremity swelling or edema. No varicosities. Skin:    General: Skin is warm and dry.     Findings: No erythema or rash.  Neurological:     Mental Status: Alert and oriented to person, place, and time.  Psychiatric:        Mood and Affect: Mood normal.        Behavior: Behavior normal.    Assessment/Plan: The patient is scheduled for excision of skin lesions with Dr. Waddell.  Risks, benefits, and alternatives of procedure discussed, questions answered and consent obtained.    Smoking Status: Former smoker.  Last Mammogram: 03/2024; Results: BI-RADS Category 2: Benign.  Caprini Score: 8; Risk Factors include: Age, BMI greater than 25, history of cancer, and length of planned surgery. Recommendation for mechanical and possibly pharmacological prophylaxis.  Will discuss with Dr. Waddell and prescribe Lovenox  if indicated.  Otherwise, will encourage early ambulation.   Pictures obtained: 03/05/2024  Post-op Rx sent to pharmacy: None.  Patient reports that she has hydrocodone  at home (which she prefers over oxycodone ), as well as Zofran .  Patient was provided with the General Surgical Risk consent document and Pain Medication Agreement prior to their appointment.  They had adequate time to read through the risk consent documents and Pain Medication Agreement. We also discussed them in person together during this preop appointment. All of their questions were answered to their satisfaction.  Recommended calling if they have any further questions.  Risk consent form and Pain Medication Agreement to be scanned into patient's chart.    Electronically signed by: Honora Seip, PA-C  04/10/2024 3:06 PM

## 2024-04-11 ENCOUNTER — Encounter: Payer: Self-pay | Admitting: Family Medicine

## 2024-04-11 MED ORDER — TIZANIDINE HCL 4 MG PO TABS: 4.0000 mg | ORAL_TABLET | Freq: Four times a day (QID) | ORAL | 1 refills | Status: AC | PRN

## 2024-04-11 MED ORDER — MELOXICAM 7.5 MG PO TABS: 7.5000 mg | ORAL_TABLET | Freq: Two times a day (BID) | ORAL | 1 refills | Status: AC | PRN

## 2024-04-15 ENCOUNTER — Ambulatory Visit (INDEPENDENT_AMBULATORY_CARE_PROVIDER_SITE_OTHER)

## 2024-04-15 VITALS — Temp 98.8°F

## 2024-04-15 DIAGNOSIS — Z23 Encounter for immunization: Secondary | ICD-10-CM

## 2024-04-16 LAB — CBC WITH DIFFERENTIAL/PLATELET
Basophils Absolute: 0.1 x10E3/uL (ref 0.0–0.2)
Basos: 1 %
EOS (ABSOLUTE): 0.3 x10E3/uL (ref 0.0–0.4)
Eos: 3 %
Hematocrit: 39.7 % (ref 34.0–46.6)
Hemoglobin: 12.8 g/dL (ref 11.1–15.9)
Immature Grans (Abs): 0.1 x10E3/uL (ref 0.0–0.1)
Immature Granulocytes: 1 %
Lymphocytes Absolute: 3.6 x10E3/uL — ABNORMAL HIGH (ref 0.7–3.1)
Lymphs: 38 %
MCH: 31 pg (ref 26.6–33.0)
MCHC: 32.2 g/dL (ref 31.5–35.7)
MCV: 96 fL (ref 79–97)
Monocytes Absolute: 0.6 x10E3/uL (ref 0.1–0.9)
Monocytes: 7 %
Neutrophils Absolute: 4.8 x10E3/uL (ref 1.4–7.0)
Neutrophils: 50 %
Platelets: 295 x10E3/uL (ref 150–450)
RBC: 4.13 x10E6/uL (ref 3.77–5.28)
RDW: 12.9 % (ref 11.7–15.4)
WBC: 9.4 x10E3/uL (ref 3.4–10.8)

## 2024-04-16 LAB — CMP14+EGFR
ALT: 19 IU/L (ref 0–32)
AST: 23 IU/L (ref 0–40)
Albumin: 4.3 g/dL (ref 3.8–4.8)
Alkaline Phosphatase: 82 IU/L (ref 49–135)
BUN/Creatinine Ratio: 18 (ref 12–28)
BUN: 15 mg/dL (ref 8–27)
Bilirubin Total: 0.5 mg/dL (ref 0.0–1.2)
CO2: 25 mmol/L (ref 20–29)
Calcium: 9.5 mg/dL (ref 8.7–10.3)
Chloride: 105 mmol/L (ref 96–106)
Creatinine, Ser: 0.83 mg/dL (ref 0.57–1.00)
Globulin, Total: 1.8 g/dL (ref 1.5–4.5)
Glucose: 90 mg/dL (ref 70–99)
Potassium: 4.2 mmol/L (ref 3.5–5.2)
Sodium: 142 mmol/L (ref 134–144)
Total Protein: 6.1 g/dL (ref 6.0–8.5)
eGFR: 73 mL/min/1.73 (ref >59)

## 2024-04-16 LAB — LIPID PANEL WITH LDL/HDL RATIO
Cholesterol, Total: 204 mg/dL — ABNORMAL HIGH (ref 100–199)
HDL: 53 mg/dL (ref >39)
LDL Chol Calc (NIH): 130 mg/dL — ABNORMAL HIGH (ref 0–99)
LDL/HDL Ratio: 2.5 ratio (ref 0.0–3.2)
Triglycerides: 116 mg/dL (ref 0–149)
VLDL Cholesterol Cal: 21 mg/dL (ref 5–40)

## 2024-04-22 ENCOUNTER — Other Ambulatory Visit: Payer: Self-pay | Admitting: Family Medicine

## 2024-04-28 ENCOUNTER — Other Ambulatory Visit: Payer: Self-pay

## 2024-04-28 ENCOUNTER — Encounter (HOSPITAL_COMMUNITY): Payer: Self-pay | Admitting: Plastic Surgery

## 2024-04-28 NOTE — Progress Notes (Signed)
 SDW call  Patient was given pre-op instructions over the phone. Patient verbalized understanding of instructions provided. She denies any SOB, fever or cough    PCP - Dr. Velma Ku Cardiologist -  Pulmonary:    PPM/ICD - denies Device Orders - na Rep Notified - na   Chest x-ray - na EKG -  DOS, 05/02/2024 Stress Test - ECHO -  Cardiac Cath -  PFT: 09/17/2014  Sleep Study/sleep apnea/CPAP: denies  Type II diabetic, A1C 7.7 on 04/01/2024. Fasting Blood sugar range: 100-125 How often check sugars: every few days Tresiba , use 10 units the night before and the morning of surgery which is 50% of your regular dose   Blood Thinner Instructions: denies Aspirin  Instructions:denies   ERAS Protcol - Clears until 0430   Anesthesia review: No  Your procedure is scheduled on Friday May 02, 2024  Report to [facility] Main Entrance A at  0530  A.M., then check in with the Admitting office.  Call this number if you have problems the morning of surgery:  [phone]   If you have any questions prior to your surgery date call [phone]: Open Monday-Friday 8am-4pm If you experience any cold or flu symptoms such as cough, fever, chills, shortness of breath, etc. between now and your scheduled surgery, please notify us  at the above number     Remember:  Do not eat after midnight the night before your surgery  You may drink clear liquids until  0430   the morning of your surgery.   Clear liquids allowed are: Water , Non-Citrus Juices (without pulp), Carbonated Beverages, Clear Tea, Black Coffee ONLY (NO MILK, CREAM OR POWDERED CREAMER of any kind), and Gatorade   Take these medicines the morning of surgery with A SIP OF WATER :  Arimidex , prozac , pravastatin , vesicare   As needed: Norco, zanaflex   As of today, STOP taking any Aspirin  (unless otherwise instructed by your surgeon) Aleve, Naproxen, Ibuprofen , Motrin , Advil , Goody's, BC's, all herbal medications, fish oil, and  all vitamins. This includes your meloxicam 

## 2024-04-29 ENCOUNTER — Ambulatory Visit: Payer: Self-pay | Admitting: Family Medicine

## 2024-05-01 NOTE — Anesthesia Preprocedure Evaluation (Addendum)
 Anesthesia Evaluation  Patient identified by MRN, date of birth, ID band Patient awake    Reviewed: Allergy & Precautions, H&P , NPO status , Patient's Chart, lab work & pertinent test results  Airway Mallampati: III  TM Distance: >3 FB Neck ROM: Full    Dental no notable dental hx. (+) Upper Dentures, Lower Dentures, Dental Advisory Given   Pulmonary neg pulmonary ROS, former smoker   Pulmonary exam normal breath sounds clear to auscultation       Cardiovascular Exercise Tolerance: Good negative cardio ROS  Rhythm:Regular Rate:Normal     Neuro/Psych   Anxiety Depression    negative neurological ROS     GI/Hepatic negative GI ROS, Neg liver ROS,,,  Endo/Other  diabetes, Insulin  Dependent    Renal/GU negative Renal ROS  negative genitourinary   Musculoskeletal  (+) Arthritis , Osteoarthritis,    Abdominal   Peds  Hematology negative hematology ROS (+)   Anesthesia Other Findings   Reproductive/Obstetrics negative OB ROS                              Anesthesia Physical Anesthesia Plan  ASA: 3  Anesthesia Plan: General   Post-op Pain Management: Tylenol  PO (pre-op)*   Induction: Intravenous  PONV Risk Score and Plan: 4 or greater and Ondansetron , Dexamethasone  and Treatment may vary due to age or medical condition  Airway Management Planned: LMA  Additional Equipment:   Intra-op Plan:   Post-operative Plan: Extubation in OR  Informed Consent: I have reviewed the patients History and Physical, chart, labs and discussed the procedure including the risks, benefits and alternatives for the proposed anesthesia with the patient or authorized representative who has indicated his/her understanding and acceptance.     Dental advisory given  Plan Discussed with: CRNA  Anesthesia Plan Comments:          Anesthesia Quick Evaluation

## 2024-05-02 ENCOUNTER — Ambulatory Visit (HOSPITAL_COMMUNITY): Payer: Self-pay | Admitting: Anesthesiology

## 2024-05-02 ENCOUNTER — Ambulatory Visit (HOSPITAL_COMMUNITY)
Admission: RE | Admit: 2024-05-02 | Discharge: 2024-05-02 | Disposition: A | Discharge status: Home / Self Care | Attending: Plastic Surgery | Admitting: Plastic Surgery

## 2024-05-02 ENCOUNTER — Encounter (HOSPITAL_COMMUNITY): Payer: Self-pay | Admitting: Plastic Surgery

## 2024-05-02 ENCOUNTER — Other Ambulatory Visit: Payer: Self-pay

## 2024-05-02 ENCOUNTER — Encounter (HOSPITAL_COMMUNITY)
Admission: RE | Disposition: A | Discharge status: Home / Self Care | Payer: Self-pay | Source: Home / Self Care | Attending: Plastic Surgery

## 2024-05-02 DIAGNOSIS — F418 Other specified anxiety disorders: Secondary | ICD-10-CM | POA: Diagnosis not present

## 2024-05-02 DIAGNOSIS — E119 Type 2 diabetes mellitus without complications: Secondary | ICD-10-CM

## 2024-05-02 DIAGNOSIS — L989 Disorder of the skin and subcutaneous tissue, unspecified: Secondary | ICD-10-CM

## 2024-05-02 DIAGNOSIS — D1721 Benign lipomatous neoplasm of skin and subcutaneous tissue of right arm: Secondary | ICD-10-CM | POA: Diagnosis not present

## 2024-05-02 DIAGNOSIS — E785 Hyperlipidemia, unspecified: Secondary | ICD-10-CM | POA: Diagnosis not present

## 2024-05-02 DIAGNOSIS — D235 Other benign neoplasm of skin of trunk: Secondary | ICD-10-CM

## 2024-05-02 DIAGNOSIS — L720 Epidermal cyst: Secondary | ICD-10-CM

## 2024-05-02 HISTORY — PX: EXCISION MASS UPPER EXTREMETIES: SHX6704

## 2024-05-02 LAB — GLUCOSE, CAPILLARY
Glucose-Capillary: 130 mg/dL — ABNORMAL HIGH (ref 70–99)
Glucose-Capillary: 153 mg/dL — ABNORMAL HIGH (ref 70–99)

## 2024-05-02 SURGERY — EXCISION MASS UPPER EXTREMITIES
Anesthesia: General

## 2024-05-02 MED ORDER — FENTANYL CITRATE (PF) 100 MCG/2ML IJ SOLN: 25.0000 ug | INTRAMUSCULAR | Status: DC | PRN

## 2024-05-02 MED ORDER — FENTANYL CITRATE (PF) 250 MCG/5ML IJ SOLN
INTRAMUSCULAR | Status: DC | PRN
  Administered 2024-05-02: 50 ug via INTRAVENOUS
  Administered 2024-05-02 (×2): 100 ug via INTRAVENOUS

## 2024-05-02 MED ORDER — ORAL CARE MOUTH RINSE: 15.0000 mL | Freq: Once | OROMUCOSAL | Status: AC

## 2024-05-02 MED ORDER — INSULIN ASPART 100 UNIT/ML IJ SOLN: 0.0000 [IU] | INTRAMUSCULAR | Status: DC | PRN

## 2024-05-02 MED ORDER — EPHEDRINE 5 MG/ML INJ
INTRAVENOUS | Status: AC
  Filled 2024-05-02: qty 5

## 2024-05-02 MED ORDER — ACETAMINOPHEN 500 MG PO TABS
1000.0000 mg | ORAL_TABLET | Freq: Once | ORAL | Status: AC
  Administered 2024-05-02: 1000 mg via ORAL
  Filled 2024-05-02: qty 2

## 2024-05-02 MED ORDER — ALBUMIN HUMAN 5 % IV SOLN: INTRAVENOUS | Status: DC | PRN

## 2024-05-02 MED ORDER — CEFAZOLIN SODIUM-DEXTROSE 2-4 GM/100ML-% IV SOLN
2.0000 g | INTRAVENOUS | Status: AC
  Administered 2024-05-02: 2 g via INTRAVENOUS
  Filled 2024-05-02: qty 100

## 2024-05-02 MED ORDER — CHLORHEXIDINE GLUCONATE 0.12 % MT SOLN
15.0000 mL | Freq: Once | OROMUCOSAL | Status: AC
  Administered 2024-05-02: 15 mL via OROMUCOSAL
  Filled 2024-05-02: qty 15

## 2024-05-02 MED ORDER — BUPIVACAINE-EPINEPHRINE (PF) 0.25% -1:200000 IJ SOLN
INTRAMUSCULAR | Status: DC | PRN
  Administered 2024-05-02: 21 mL

## 2024-05-02 MED ORDER — LIDOCAINE 2% (20 MG/ML) 5 ML SYRINGE
INTRAMUSCULAR | Status: AC
Start: 2024-05-02 — End: 2024-05-02
  Filled 2024-05-02: qty 5

## 2024-05-02 MED ORDER — ONDANSETRON HCL 4 MG/2ML IJ SOLN
INTRAMUSCULAR | Status: AC
Start: 2024-05-02 — End: 2024-05-02
  Filled 2024-05-02: qty 2

## 2024-05-02 MED ORDER — LIDOCAINE 2% (20 MG/ML) 5 ML SYRINGE
INTRAMUSCULAR | Status: DC | PRN
  Administered 2024-05-02: 60 mg via INTRAVENOUS

## 2024-05-02 MED ORDER — PROPOFOL 10 MG/ML IV BOLUS
INTRAVENOUS | Status: DC | PRN
Start: 2024-05-02 — End: 2024-05-02
  Administered 2024-05-02: 130 mg via INTRAVENOUS

## 2024-05-02 MED ORDER — DEXAMETHASONE SOD PHOSPHATE PF 10 MG/ML IJ SOLN
INTRAMUSCULAR | Status: DC | PRN
  Administered 2024-05-02: 5 mg via INTRAVENOUS

## 2024-05-02 MED ORDER — CHLORHEXIDINE GLUCONATE CLOTH 2 % EX PADS: 6.0000 | MEDICATED_PAD | Freq: Once | CUTANEOUS | Status: DC

## 2024-05-02 MED ORDER — 0.9 % SODIUM CHLORIDE (POUR BTL) OPTIME
TOPICAL | Status: DC | PRN
  Administered 2024-05-02: 1000 mL

## 2024-05-02 MED ORDER — BUPIVACAINE-EPINEPHRINE (PF) 0.25% -1:200000 IJ SOLN
INTRAMUSCULAR | Status: AC
  Filled 2024-05-02: qty 30

## 2024-05-02 MED ORDER — PHENYLEPHRINE HCL-NACL 20-0.9 MG/250ML-% IV SOLN
INTRAVENOUS | Status: DC | PRN
  Administered 2024-05-02: 20 ug/min via INTRAVENOUS

## 2024-05-02 MED ORDER — ONDANSETRON HCL 4 MG/2ML IJ SOLN
INTRAMUSCULAR | Status: DC | PRN
  Administered 2024-05-02: 4 mg via INTRAVENOUS

## 2024-05-02 MED ORDER — PHENYLEPHRINE 80 MCG/ML (10ML) SYRINGE FOR IV PUSH (FOR BLOOD PRESSURE SUPPORT)
PREFILLED_SYRINGE | INTRAVENOUS | Status: DC | PRN
  Administered 2024-05-02: 80 ug via INTRAVENOUS
  Administered 2024-05-02 (×2): 160 ug via INTRAVENOUS

## 2024-05-02 MED ORDER — EPHEDRINE SULFATE-NACL 50-0.9 MG/10ML-% IV SOSY
PREFILLED_SYRINGE | INTRAVENOUS | Status: DC | PRN
  Administered 2024-05-02: 15 mg via INTRAVENOUS
  Administered 2024-05-02: 10 mg via INTRAVENOUS

## 2024-05-02 MED ORDER — LACTATED RINGERS IV SOLN: INTRAVENOUS | Status: DC

## 2024-05-02 MED ORDER — FENTANYL CITRATE (PF) 250 MCG/5ML IJ SOLN
INTRAMUSCULAR | Status: AC
  Filled 2024-05-02: qty 5

## 2024-05-02 MED ORDER — PROPOFOL 10 MG/ML IV BOLUS
INTRAVENOUS | Status: AC
Start: 2024-05-02 — End: 2024-05-02
  Filled 2024-05-02: qty 20

## 2024-05-02 SURGICAL SUPPLY — 29 items
BAG COUNTER SPONGE SURGICOUNT (BAG) ×2 IMPLANT
BLADE SURG 15 STRL LF DISP TIS (BLADE) ×2 IMPLANT
CANISTER SUCTION 3000ML PPV (SUCTIONS) IMPLANT
DERMABOND ADVANCED .7 DNX12 (GAUZE/BANDAGES/DRESSINGS) IMPLANT
DRAPE U-SHAPE 76X120 STRL (DRAPES) IMPLANT
DRESSING MEPILEX FLEX 4X4 (GAUZE/BANDAGES/DRESSINGS) IMPLANT
DRSG TEGADERM 2-3/8X2-3/4 SM (GAUZE/BANDAGES/DRESSINGS) IMPLANT
ELECT CAUTERY BLADE 6.4 (BLADE) IMPLANT
ELECTRODE REM PT RTRN 9FT ADLT (ELECTROSURGICAL) ×2 IMPLANT
GAUZE SPONGE 4X4 12PLY STRL (GAUZE/BANDAGES/DRESSINGS) IMPLANT
GAUZE SPONGE 4X4 12PLY STRL LF (GAUZE/BANDAGES/DRESSINGS) ×2 IMPLANT
GLOVE BIO SURGEON STRL SZ8 (GLOVE) ×2 IMPLANT
GLOVE BIOGEL PI IND STRL 8 (GLOVE) ×2 IMPLANT
GOWN STRL REUS W/ TWL LRG LVL3 (GOWN DISPOSABLE) ×6 IMPLANT
KIT BASIN OR (CUSTOM PROCEDURE TRAY) ×2 IMPLANT
NDL HYPO 22X1.5 SAFETY MO (MISCELLANEOUS) IMPLANT
NDL HYPO 25GX1X1/2 BEV (NEEDLE) ×2 IMPLANT
PACK SRG BSC III STRL LF ECLPS (CUSTOM PROCEDURE TRAY) ×2 IMPLANT
PACK UNIVERSAL I (CUSTOM PROCEDURE TRAY) IMPLANT
PENCIL BUTTON HOLSTER BLD 10FT (ELECTRODE) ×2 IMPLANT
SOLN 0.9% NACL POUR BTL 1000ML (IV SOLUTION) ×2 IMPLANT
SPONGE T-LAP 18X18 ~~LOC~~+RFID (SPONGE) ×4 IMPLANT
SUT MNCRL AB 3-0 PS2 27 (SUTURE) IMPLANT
SUT MNCRL AB 4-0 PS2 18 (SUTURE) IMPLANT
SYR BULB EAR ULCER 3OZ GRN STR (SYRINGE) ×2 IMPLANT
SYR CONTROL 10ML LL (SYRINGE) ×2 IMPLANT
TOWEL GREEN STERILE FF (TOWEL DISPOSABLE) ×2 IMPLANT
TUBE CONNECTING 12X1/4 (SUCTIONS) ×2 IMPLANT
YANKAUER SUCT BULB TIP NO VENT (SUCTIONS) ×2 IMPLANT

## 2024-05-02 NOTE — Interval H&P Note (Signed)
 History and Physical Interval Note: No change in exam or indication for surgery All questions answered Asked if I would excise an additional skin lesion which is marked on the left chest Will proceed at her request  05/02/2024 7:10 AM  Kelli [name]  has presented today for surgery, with the diagnosis of L98.9.  The various methods of treatment have been discussed with the patient and family. After consideration of risks, benefits and other options for treatment, the patient has consented to  Procedure(s) with comments: EXCISION MASS UPPER EXTREMITIES (N/A) - Excision of skin lesions as a surgical intervention.  The patient's history has been reviewed, patient examined, no change in status, stable for surgery.  I have reviewed the patient's chart and labs.  Questions were answered to the patient's satisfaction.     Leonce KATHEE Birmingham

## 2024-05-02 NOTE — Anesthesia Procedure Notes (Signed)
 Procedure Name: LMA Insertion Date/Time: 05/02/2024 7:46 AM  Performed by: Hedy Jarred, CRNAPre-anesthesia Checklist: Patient identified, Emergency Drugs available, Suction available and Patient being monitored Patient Re-evaluated:Patient Re-evaluated prior to induction Oxygen Delivery Method: Circle System Utilized Preoxygenation: Pre-oxygenation with 100% oxygen Induction Type: IV induction Ventilation: Mask ventilation without difficulty LMA: LMA inserted and LMA with gastric port inserted LMA Size: 4.0 Number of attempts: 1 Airway Equipment and Method: Bite block Placement Confirmation: positive ETCO2 Tube secured with: Tape Dental Injury: Teeth and Oropharynx as per pre-operative assessment

## 2024-05-02 NOTE — Transfer of Care (Signed)
 Immediate Anesthesia Transfer of Care Note  Patient: Kelli [name]  Procedure(s) Performed: EXCISION MASS UPPER EXTREMITIES; LEFT SHOULDER, LEFT CHEST, LEFT BREAST, RIGHT ELBOW  Patient Location: PACU  Anesthesia Type:General  Level of Consciousness: awake, alert , and oriented  Airway & Oxygen Therapy: Patient Spontanous Breathing  Post-op Assessment: Report given to RN and Post -op Vital signs reviewed and stable  Post vital signs: Reviewed and stable  Last Vitals:  Vitals Value Taken Time  BP 92/68 05/02/24 09:17  Temp 36.5 C 05/02/24 09:17  Pulse 75 05/02/24 09:24  Resp 14 05/02/24 09:24  SpO2 85 % 05/02/24 09:24  Vitals shown include unfiled device data.  Last Pain:  Vitals:   05/02/24 0606  TempSrc:   PainSc: 0-No pain         Complications: No notable events documented.

## 2024-05-02 NOTE — Anesthesia Postprocedure Evaluation (Signed)
 Anesthesia Post Note  Patient: Kelli [name]  Procedure(s) Performed: EXCISION MASS UPPER EXTREMITIES; LEFT SHOULDER, LEFT CHEST, LEFT BREAST, RIGHT ELBOW     Patient location during evaluation: PACU Anesthesia Type: General Level of consciousness: awake and alert Pain management: pain level controlled Vital Signs Assessment: post-procedure vital signs reviewed and stable Respiratory status: spontaneous breathing, nonlabored ventilation and respiratory function stable Cardiovascular status: blood pressure returned to baseline and stable Postop Assessment: no apparent nausea or vomiting Anesthetic complications: no   No notable events documented.  Last Vitals:  Vitals:   05/02/24 0945 05/02/24 1000  BP: (!) 115/49 (!) 121/51  Pulse: 72 71  Resp: 10 10  Temp:  36.6 C  SpO2: 96% 97%    Last Pain:  Vitals:   05/02/24 1000  TempSrc:   PainSc: 0-No pain                 Kaitelyn Jamison,W. EDMOND

## 2024-05-02 NOTE — Op Note (Signed)
 DATE OF OPERATION: 05/02/2024  LOCATION: [facility] Main operating Room  PREOPERATIVE DIAGNOSIS: Multiple subcutaneous cysts  POSTOPERATIVE DIAGNOSIS: Same  PROCEDURE: Excision of 4 subcutaneous cyst, left shoulder left chest wall left inframammary fold right elbow  SURGEON: Marinell Birmingham, MD  ASSISTANT: Honora Seip  EBL: 10 cc  CONDITION: Stable  COMPLICATIONS: None  INDICATION: The patient, Kelli [name], is a 78 y.o. female born on [birth year], is here for treatment of subcutaneous cysts removed for pathologic diagnosis.   PROCEDURE DETAILS:  The patient was seen prior to surgery and marked.  The IV antibiotics were given. The patient was taken to the operating room and given a general anesthetic. A standard time out was performed and all information was confirmed by those in the room. SCDs were placed.   The left shoulder was addressed first.  This area is prepped and draped in usual sterile manner.  A 1 cm incision was made over the mass which measured approximately 7 mm.  Combination of sharp and blunt dissection were used to remove the mass and the tissue was sent to pathology for routine examination.  Tissues were closed with interrupted 4-0 Monocryl sutures.  The left chest lesion was addressed next.  An elliptical incision was made around the lesion and the entire lesion was removed.  The lesion was approximately 1 cm in length and the incision was approximately 1.5 cm.  The incision was then closed with interrupted 4-0 Monocryl sutures in the dermis and a running 4-0 Monocryl subcuticular stitch.  The patient was repositioned in the inframammary fold was prepped and draped in usual sterile manner.  A 3 cm incision was made over this lesion and a combination of sharp and blunt dissection were used to isolate the lesion.  The lesion was approximately 4 cm in size however it was removed in pieces and it was impossible to determine the exact size.  Once the lesion was removed hemostasis was  achieved with the electrocautery.  The deep tissues were closed with interrupted 3-0 Monocryl sutures.  The dermis was closed with interrupted 3-0 Monocryl sutures.  The skin was closed with a running 4-0 Monocryl subcuticular stitch.  The right elbow was addressed last.  The elbow was prepped and draped sterilely and a 5 mm incision was made over the 2 mm lesion.  Lesion was removed.  The incision was closed with interrupted 4-0 Monocryl sutures.  All incisions were then sealed with Dermabond and sterile dressings were applied.  The patient was awakened from anesthesia without incident transferred to the recovery room in good condition.  All instrument needle sponge counts were reported as correct and there were no complications appreciated during the surgery. The patient was allowed to wake up and taken to recovery room in stable condition at the end of the case. The family was notified at the end of the case.   The advanced practice practitioner (APP) assisted throughout the case.  The APP was essential in retraction and counter traction when needed to make the case progress smoothly.  This retraction and assistance made it possible to see the tissue plans for the procedure.  The assistance was needed for blood control, tissue re-approximation and assisted with closure of the incision site.

## 2024-05-02 NOTE — Discharge Instructions (Signed)
 Your excision sites were closed with stitches followed by skin glue and a Band-Aid.  Mild incisional drainage and/or bleeding is common and not a cause for concern.  However, please call the office should you develop any redness, swelling, significant discomfort, or other concerns.    Follow up as scheduled.

## 2024-05-03 ENCOUNTER — Encounter (HOSPITAL_COMMUNITY): Payer: Self-pay | Admitting: Plastic Surgery

## 2024-05-06 LAB — SURGICAL PATHOLOGY

## 2024-05-07 ENCOUNTER — Ambulatory Visit: Admitting: Plastic Surgery

## 2024-05-07 VITALS — BP 129/72 | HR 76

## 2024-05-07 DIAGNOSIS — L989 Disorder of the skin and subcutaneous tissue, unspecified: Secondary | ICD-10-CM

## 2024-05-07 NOTE — Progress Notes (Signed)
 Ms. Decarli returns today for evaluation after removal of skin lesions on her left shoulder left chest wall inframammary fold and right elbow.  She is healing all of these incisions well.  Has no specific complaints.  On examination all incisions are clean dry and intact.  There is a little excoriation of the skin below the incision at the inframammary fold.  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  None of these require reexcision or any specific follow-up.  She does understand that the hidradenoma has a 10% incidence of recurrence.  Return to activity as tolerated.  Follow-up with me in 2 to 3 weeks.

## 2024-05-08 ENCOUNTER — Encounter: Admitting: Plastic Surgery

## 2024-05-18 ENCOUNTER — Encounter: Payer: Self-pay | Admitting: Family Medicine

## 2024-05-22 ENCOUNTER — Encounter: Admitting: Physician Assistant

## 2024-05-22 ENCOUNTER — Ambulatory Visit: Admitting: Plastic Surgery

## 2024-05-22 VITALS — BP 146/68 | HR 75 | Ht 68.0 in | Wt 177.8 lb

## 2024-05-22 DIAGNOSIS — L989 Disorder of the skin and subcutaneous tissue, unspecified: Secondary | ICD-10-CM

## 2024-05-22 DIAGNOSIS — Z9889 Other specified postprocedural states: Secondary | ICD-10-CM

## 2024-05-22 NOTE — Progress Notes (Signed)
 Kelli [name] returns today approximately 3 weeks postop from removal of lesions from the right elbow left shoulder left chest and  the left inframammary fold.  All incisions are healing well.  There was 1 suture that was removed today.  Return to see me for any questions or concerns.

## 2024-06-06 ENCOUNTER — Other Ambulatory Visit: Payer: Self-pay | Admitting: Family Medicine

## 2024-06-06 DIAGNOSIS — M19041 Primary osteoarthritis, right hand: Secondary | ICD-10-CM

## 2024-06-06 DIAGNOSIS — E785 Hyperlipidemia, unspecified: Secondary | ICD-10-CM

## 2024-06-11 ENCOUNTER — Other Ambulatory Visit: Payer: Self-pay

## 2024-06-11 ENCOUNTER — Telehealth: Payer: Self-pay

## 2024-06-11 ENCOUNTER — Ambulatory Visit (INDEPENDENT_AMBULATORY_CARE_PROVIDER_SITE_OTHER): Admitting: Family Medicine

## 2024-06-11 ENCOUNTER — Encounter: Payer: Self-pay | Admitting: Family Medicine

## 2024-06-11 VITALS — BP 120/72 | HR 69 | Ht 68.0 in | Wt 178.0 lb

## 2024-06-11 DIAGNOSIS — M25561 Pain in right knee: Secondary | ICD-10-CM | POA: Diagnosis not present

## 2024-06-11 DIAGNOSIS — G8929 Other chronic pain: Secondary | ICD-10-CM

## 2024-06-11 DIAGNOSIS — M170 Bilateral primary osteoarthritis of knee: Secondary | ICD-10-CM | POA: Diagnosis not present

## 2024-06-11 DIAGNOSIS — M25562 Pain in left knee: Secondary | ICD-10-CM

## 2024-06-11 MED ORDER — TRIAMCINOLONE ACETONIDE 32 MG IX SRER
32.0000 mg | Freq: Once | INTRA_ARTICULAR | Status: AC
  Administered 2024-06-11: 32 mg via INTRA_ARTICULAR

## 2024-06-11 NOTE — Telephone Encounter (Signed)
 Re-auth Zilretta , BILAT knees

## 2024-06-11 NOTE — Telephone Encounter (Signed)
 Done and made a recent telephone encounter for this

## 2024-06-11 NOTE — Patient Instructions (Signed)
 Thank you for coming in today.   Call or go to the ER if you develop a large red swollen joint with extreme pain or oozing puss.    Return as needed.  Can repeat this injection in 3 months if needed.

## 2024-06-11 NOTE — Telephone Encounter (Signed)
 Zilretta  benefits ran for bilateral knee. Patient has healthteam advantage which does not require a pre cert. Patient will get injection today. And I will upload benefits once they fax it.

## 2024-06-11 NOTE — Progress Notes (Signed)
 "        I, Leotis Batter, CMA acting as a scribe for Artist Lloyd, MD.  Kelli [name] is a 79 y.o. female who presents to [facility] today for exacerbation of her bilat knee pain. Pt was last seen by Dr. Lloyd on 02/05/24 and was given bilat Zilretta  injections.   Today, pt reports worsening knee pain that started right before the holidays. Now having pain with sitting and standing. Denies visible swelling. Sx responded well to Zilretta  injections in the past.   Pertinent review of systems: No fevers or chills  Relevant historical information: Diabetes and hyperlipidemia.   Exam:  BP 120/72   Pulse 69   Ht 5' 8 (1.727 m)   Wt 178 lb (80.7 kg)   LMP  (LMP Unknown)   SpO2 95%   BMI 27.06 kg/m  General: Well Developed, well nourished, and in no acute distress.   MSK: Knees bilaterally mild effusion normal motion with crepitation.    Lab and Radiology Results   Zilretta  injection bilateral knee Procedure: Real-time Ultrasound Guided Injection of right knee joint superior lateral patellar space Device: Philips Affiniti 50G Images permanently stored and available for review in PACS Verbal informed consent obtained.  Discussed risks and benefits of procedure. Warned about infection, hyperglycemia bleeding, damage to structures among others. Patient expresses understanding and agreement Time-out conducted.   Noted no overlying erythema, induration, or other signs of local infection.   Skin prepped in a sterile fashion.   Local anesthesia: Topical Ethyl chloride.   With sterile technique and under real time ultrasound guidance: Zilretta  32 mg injected into knee joint. Fluid seen entering the joint capsule.   Completed without difficulty   Advised to call if fevers/chills, erythema, induration, drainage, or persistent bleeding.   Images permanently stored and available for review in the ultrasound unit.  Impression: Technically successful ultrasound  guided injection.  Procedure: Real-time Ultrasound Guided Injection of left knee joint superior lateral patellar space Device: Philips Affiniti 50G Images permanently stored and available for review in PACS Verbal informed consent obtained.  Discussed risks and benefits of procedure. Warned about infection, hyperglycemia bleeding, damage to structures among others. Patient expresses understanding and agreement Time-out conducted.   Noted no overlying erythema, induration, or other signs of local infection.   Skin prepped in a sterile fashion.   Local anesthesia: Topical Ethyl chloride.   With sterile technique and under real time ultrasound guidance: Zilretta  32 mg injected into knee joint. Fluid seen entering the joint capsule.   Completed without difficulty   Advised to call if fevers/chills, erythema, induration, drainage, or persistent bleeding.   Images permanently stored and available for review in the ultrasound unit.  Impression: Technically successful ultrasound guided injection.  Lot number: [id number]     Assessment and Plan: 79 y.o. female with bilateral knee DJD and pain.  Plan for repeat Zilretta  injection today.  Can repeat this injection in 3 months if needed.  Continue quad strengthening.   PDMP not reviewed this encounter. Orders Placed This Encounter  Procedures   US  LIMITED JOINT SPACE STRUCTURES LOW BILAT(NO LINKED CHARGES)    Reason for Exam (SYMPTOM  OR DIAGNOSIS REQUIRED):   bilat knee pain    Preferred imaging location?:   [facility] ordered this encounter  Medications   Triamcinolone  Acetonide (ZILRETTA ) intra-articular injection 32 mg   Triamcinolone  Acetonide (ZILRETTA ) intra-articular injection 32 mg     Discussed warning  signs or symptoms. Please see discharge instructions. Patient expresses understanding.   The above documentation has been reviewed and is accurate and complete Artist Lloyd, M.D.   "

## 2024-06-12 ENCOUNTER — Ambulatory Visit: Admitting: Family Medicine

## 2024-06-12 NOTE — Telephone Encounter (Signed)
 Zilretta  authorized for bilateral knee Coinsurance 80% patient responsible for 20% Cop[employer] $25 Deductible does not apply OOP MAX $3900 has met $0 Once OOP has been met coverage goes to 100% NO PRE CERT REQUIRED  Reference # [id number]

## 2024-06-13 ENCOUNTER — Encounter: Payer: Self-pay | Admitting: Podiatry

## 2024-06-13 ENCOUNTER — Ambulatory Visit: Payer: Self-pay | Admitting: Podiatry

## 2024-06-13 DIAGNOSIS — E119 Type 2 diabetes mellitus without complications: Secondary | ICD-10-CM

## 2024-06-13 DIAGNOSIS — L84 Corns and callosities: Secondary | ICD-10-CM

## 2024-06-13 NOTE — Progress Notes (Signed)
"  °  Subjective:  Patient ID: Kelli [name], female    DOB: 1945-12-31,   MRN: [id number]  Chief Complaint  Patient presents with   Diabetes    I'm Diabetic and they won't let me use the corn pads.  It is between the last two toes.  Saw Dr. Velma Ku - 04/01/2024;  A1c - 7.7    79 y.o. female presents for concern of a painful corn on the right foot that has been present for many years.  . Requesting to have it trimmed. Although it is doing much better. Patient relates she is diabetic and her last A1c was 7.7 . Denies any other pedal complaints. Denies n/v/f/c.   PCP: Velma Ku DO Last seen 10/03/21  Past Medical History:  Diagnosis Date   Anxiety    Arthritis    Colon cancer (HCC)    colon ca dx 02/2009, lung cancer   Depression    Depression with anxiety 09/19/2011   Diabetes mellitus type 2, uncomplicated (HCC) 12/31/2014   Genetic testing 06/06/2022   H/O colon cancer, stage II 09/19/2011   T3N0  Cecum Sept 2010   Hyperlipidemia 09/19/2011   Lung nodules 09/19/2011   granulomas   Osteoporosis 2022   Respiratory illness with fever 02/28/2023   Urinary urgency     Objective:  Physical Exam: Vascular: DP/PT pulses 2/4 bilateral. CFT <3 seconds. Normal hair growth on digits. No edema.  Skin. No lacerations or abrasions bilateral feet. Hyperkeratotic lesion noted to medial fifth digit on the right.  Musculoskeletal: MMT 5/5 bilateral lower extremities in DF, PF, Inversion and Eversion. Deceased ROM in DF of ankle joint. Adductovarus noted to right fifth digit.  Neurological: Sensation intact to light touch.   Assessment:   1. Corns and callosities   2. Type 2 diabetes mellitus without complication, without long-term current use of insulin  (HCC)         Plan:  Patient was evaluated and treated and all questions answered. -Discussed corns and calluses with patient and treatment options.  -Hyperkeratotic tissue was debrided with chisel without incident to medial  fifth digit on right.   -Encouraged daily moisturizing -Discussed use of pumice stone -Advised good supportive shoes and inserts -Discussed and educated patient on diabetic foot care, especially with  regards to the vascular, neurological and musculoskeletal systems.  -Stressed the importance of good glycemic control and the detriment of not  controlling glucose levels in relation to the foot. -Discussed supportive shoes at all times and checking feet regularly.  -Answered all patient questions -Patient to return in 6 month for diabetic foot check.     Kelli [name], DPM   "

## 2024-07-02 ENCOUNTER — Ambulatory Visit: Admitting: Family Medicine

## 2024-07-10 ENCOUNTER — Ambulatory Visit: Admitting: Family Medicine

## 2024-07-10 ENCOUNTER — Encounter: Payer: Self-pay | Admitting: Family Medicine

## 2024-07-14 ENCOUNTER — Ambulatory Visit: Admitting: Family Medicine

## 2024-10-16 ENCOUNTER — Ambulatory Visit: Admitting: Oncology

## 2024-11-26 ENCOUNTER — Ambulatory Visit
